# Patient Record
Sex: Male | Born: 1960 | Race: Black or African American | Hispanic: No | Marital: Single | Smoking: Former smoker
Health system: Southern US, Community
[De-identification: ages and names within clinical notes are randomized; demographics above are authoritative.]

## PROBLEM LIST (undated history)

## (undated) DIAGNOSIS — F79 Unspecified intellectual disabilities: Secondary | ICD-10-CM

## (undated) DIAGNOSIS — I10 Essential (primary) hypertension: Secondary | ICD-10-CM

## (undated) DIAGNOSIS — F250 Schizoaffective disorder, bipolar type: Secondary | ICD-10-CM

## (undated) DIAGNOSIS — F259 Schizoaffective disorder, unspecified: Secondary | ICD-10-CM

## (undated) DIAGNOSIS — F429 Obsessive-compulsive disorder, unspecified: Secondary | ICD-10-CM

## (undated) HISTORY — PX: KNEE ARTHROSCOPY: SUR90

## (undated) HISTORY — DX: Schizoaffective disorder, bipolar type: F25.0

## (undated) HISTORY — DX: Schizoaffective disorder, unspecified: F25.9

---

## 2016-08-20 DIAGNOSIS — R440 Auditory hallucinations: Secondary | ICD-10-CM | POA: Diagnosis not present

## 2016-08-20 DIAGNOSIS — R4182 Altered mental status, unspecified: Secondary | ICD-10-CM | POA: Insufficient documentation

## 2016-08-20 DIAGNOSIS — F918 Other conduct disorders: Secondary | ICD-10-CM | POA: Diagnosis present

## 2016-08-20 NOTE — ED Triage Notes (Signed)
Pt brought over voluntarily from [facility] for aggressive behavior; caregiver says pt came out of his room acting like he was going to hurt himself or hurt someone else; pt calm and cooperative in triage;

## 2016-08-20 NOTE — ED Notes (Addendum)
Pt changing out of personal clothing and into behavorial clothing by this tech and RN matt. Pt belongings were received were as follows..........................Marland Kitchen White short sleeved shirt, army green fleece pajama bottoms, black socks (2) and tan fleece bedroom shoes......................Marland Kitchen All belongings placed in belonging bag with lime green sticker on 1 bag and pt name and placed at [employer] for lock up

## 2016-08-21 ENCOUNTER — Emergency Department
Admission: EM | Admit: 2016-08-21 | Discharge: 2016-08-21 | Disposition: A | Discharge status: Home / Self Care | Payer: Medicaid Other | Attending: Emergency Medicine | Admitting: Emergency Medicine

## 2016-08-21 DIAGNOSIS — F22 Delusional disorders: Secondary | ICD-10-CM

## 2016-08-21 DIAGNOSIS — R443 Hallucinations, unspecified: Secondary | ICD-10-CM

## 2016-08-21 DIAGNOSIS — F4320 Adjustment disorder, unspecified: Secondary | ICD-10-CM

## 2016-08-21 LAB — COMPREHENSIVE METABOLIC PANEL
ALT: 24 U/L (ref 17–63)
AST: 26 U/L (ref 15–41)
Albumin: 4.5 g/dL (ref 3.5–5.0)
Alkaline Phosphatase: 74 U/L (ref 38–126)
Anion gap: 4 — ABNORMAL LOW (ref 5–15)
BUN: 22 mg/dL — AB (ref 6–20)
CHLORIDE: 106 mmol/L (ref 101–111)
CO2: 29 mmol/L (ref 22–32)
CREATININE: 1.47 mg/dL — AB (ref 0.61–1.24)
Calcium: 9.8 mg/dL (ref 8.9–10.3)
GFR calc Af Amer: >60 mL/min (ref >60)
GFR calc non Af Amer: 52 mL/min — ABNORMAL LOW (ref >60)
GLUCOSE: 75 mg/dL (ref 65–99)
Potassium: 4.3 mmol/L (ref 3.5–5.1)
SODIUM: 139 mmol/L (ref 135–145)
Total Bilirubin: 0.7 mg/dL (ref 0.3–1.2)
Total Protein: 7.7 g/dL (ref 6.5–8.1)

## 2016-08-21 LAB — CBC WITH DIFFERENTIAL/PLATELET
BASOS ABS: 0 10*3/uL (ref 0–0.1)
Basophils Relative: 1 %
EOS ABS: 0.1 10*3/uL (ref 0–0.7)
EOS PCT: 2 %
HCT: 41.1 % (ref 40.0–52.0)
Hemoglobin: 13.8 g/dL (ref 13.0–18.0)
LYMPHS PCT: 20 %
Lymphs Abs: 1.2 10*3/uL (ref 1.0–3.6)
MCH: 29.9 pg (ref 26.0–34.0)
MCHC: 33.6 g/dL (ref 32.0–36.0)
MCV: 88.9 fL (ref 80.0–100.0)
Monocytes Absolute: 0.4 10*3/uL (ref 0.2–1.0)
Monocytes Relative: 6 %
NEUTROS PCT: 71 %
Neutro Abs: 4.4 10*3/uL (ref 1.4–6.5)
PLATELETS: 178 10*3/uL (ref 150–440)
RBC: 4.62 MIL/uL (ref 4.40–5.90)
RDW: 13.9 % (ref 11.5–14.5)
WBC: 6.2 10*3/uL (ref 3.8–10.6)

## 2016-08-21 LAB — URINE DRUG SCREEN, QUALITATIVE (ARMC ONLY)
Amphetamines, Ur Screen: NOT DETECTED
BARBITURATES, UR SCREEN: NOT DETECTED
Benzodiazepine, Ur Scrn: NOT DETECTED
COCAINE METABOLITE, UR ~~LOC~~: NOT DETECTED
Cannabinoid 50 Ng, Ur ~~LOC~~: NOT DETECTED
MDMA (Ecstasy)Ur Screen: NOT DETECTED
Methadone Scn, Ur: NOT DETECTED
Opiate, Ur Screen: NOT DETECTED
PHENCYCLIDINE (PCP) UR S: NOT DETECTED
Tricyclic, Ur Screen: POSITIVE — AB

## 2016-08-21 LAB — URINALYSIS, ROUTINE W REFLEX MICROSCOPIC
BACTERIA UA: NONE SEEN
Bilirubin Urine: NEGATIVE
GLUCOSE, UA: NEGATIVE mg/dL
Hgb urine dipstick: NEGATIVE
KETONES UR: 5 mg/dL — AB
LEUKOCYTES UA: NEGATIVE
Nitrite: NEGATIVE
PROTEIN: 30 mg/dL — AB
SQUAMOUS EPITHELIAL / LPF: NONE SEEN
Specific Gravity, Urine: 1.026 (ref 1.005–1.030)
pH: 5 (ref 5.0–8.0)

## 2016-08-21 LAB — SALICYLATE LEVEL

## 2016-08-21 LAB — ACETAMINOPHEN LEVEL: Acetaminophen (Tylenol), Serum: <10 ug/mL — ABNORMAL LOW (ref 10–30)

## 2016-08-21 LAB — ETHANOL: Alcohol, Ethyl (B): <5 mg/dL (ref <5)

## 2016-08-21 MED ORDER — LORAZEPAM 2 MG PO TABS
2.0000 mg | ORAL_TABLET | Freq: Once | ORAL | Status: AC
  Administered 2016-08-21: 2 mg via ORAL
  Filled 2016-08-21: qty 1

## 2016-08-21 MED ORDER — NICOTINE 14 MG/24HR TD PT24
14.0000 mg | MEDICATED_PATCH | Freq: Once | TRANSDERMAL | Status: DC
  Administered 2016-08-21: 14 mg via TRANSDERMAL
  Filled 2016-08-21: qty 1

## 2016-08-21 MED ORDER — LORAZEPAM 0.5 MG PO TABS: 0.5000 mg | ORAL_TABLET | Freq: Two times a day (BID) | ORAL | 0 refills | Status: DC | PRN

## 2016-08-21 NOTE — BH Assessment (Signed)
Attempted to contact Director of patients living facility Shirlean Mylar Blackwell-[phone]) and the QP listed as an emergency contact Baldo Daub) to get collateral information.  A voice mail was left for both individuals.

## 2016-08-21 NOTE — BH Assessment (Signed)
Assessment Note  Austin Gates is an 56 y.o. male. The patient came in after displaying aggressive behavior at his group home.  The patient reported he wanted to get a cigarette or get a nicotine patch and a group home worker said something to the patient that he did not like.  The patient said he started yelling at the group home worker, but had no plans of hitting the other group home worker.  The patient denies wanting to hurt anyone.  He reported he has become angry in the past and punched a wall but denies hitting other people.  The patient stated he has a good appetite and sleeps well.  However, since being in the emergency room the patient has had to be redirected to stay in his room.  The patient reported he hears voices, but could not state what the voices were saying.  He denied having visual hallucinations.  He denied SI, HI and SA.  Diagnosis: psychotic disorder  Past Medical History: No past medical history on file.  No past surgical history on file.  Family History: No family history on file.  Social History:  has no tobacco, alcohol, and drug history on file.  Additional Social History:  Alcohol / Drug Use Pain Medications: See PTA Prescriptions: See PTA Over the Counter: See PTA History of alcohol / drug use?: No history of alcohol / drug abuse Longest period of sobriety (when/how long): NA  CIWA: CIWA-Ar BP: 100/71 Pulse Rate: (!) 106 COWS:    Allergies: No Known Allergies  Home Medications:  (Not in a hospital admission)  OB/GYN Status:  No LMP for male patient.  General Assessment Data Location of Assessment: [facility] ED TTS Assessment: In system Is this a Tele or Face-to-Face Assessment?: Face-to-Face Is this an Initial Assessment or a Re-assessment for this encounter?: Initial Assessment Marital status: Single Maiden name: NA Living Arrangements: Group Home Can pt return to current living arrangement?: Yes Admission Status: Voluntary Is patient capable  of signing voluntary admission?: No Referral Source: Other (group home) Insurance type: Medicaid     Crisis Care Plan Living Arrangements: Group Home Legal Guardian: Other relative Name of Psychiatrist: unknown Name of Therapist: unknown  Education Status Is patient currently in school?: No Current Grade: NA Highest grade of school patient has completed: 12 Name of school: NA Contact person: NA  Risk to self with the past 6 months Suicidal Ideation: No Has patient been a risk to self within the past 6 months prior to admission? : No Suicidal Intent: No Has patient had any suicidal intent within the past 6 months prior to admission? : No Is patient at risk for suicide?: No Suicidal Plan?: No Has patient had any suicidal plan within the past 6 months prior to admission? : No Access to Means: No What has been your use of drugs/alcohol within the last 12 months?: none known Previous Attempts/Gestures: No How many times?: 0 Other Self Harm Risks: none Triggers for Past Attempts: None known Intentional Self Injurious Behavior: None Family Suicide History: Unknown Recent stressful life event(s): Conflict (Comment) (conflict with group home worker) Persecutory voices/beliefs?: Yes Depression: No Depression Symptoms: Insomnia Substance abuse history and/or treatment for substance abuse?: No Suicide prevention information given to non-admitted patients: Not applicable  Risk to Others within the past 6 months Homicidal Ideation: No Does patient have any lifetime risk of violence toward others beyond the six months prior to admission? : No Thoughts of Harm to Others: No Current Homicidal Intent: No  Current Homicidal Plan: No Access to Homicidal Means: No Identified Victim: NA History of harm to others?: No Assessment of Violence: None Noted Violent Behavior Description: yelling Does patient have access to weapons?: No Criminal Charges Pending?: No Does patient have a court  date: No Is patient on probation?: No  Psychosis Hallucinations: Auditory Delusions: None noted  Mental Status Report Appearance/Hygiene: In scrubs Eye Contact: Good Motor Activity: Unremarkable Speech: Tangential, Logical/coherent Level of Consciousness: Alert, Restless Mood: Pleasant, Anxious Affect: Anxious Anxiety Level: Moderate Thought Processes: Tangential Judgement: Impaired Orientation: Appropriate for developmental age Obsessive Compulsive Thoughts/Behaviors: None  Cognitive Functioning Concentration: Decreased Memory: Recent Intact, Remote Intact IQ: Average Insight: Fair Impulse Control: Fair Appetite: Good Weight Loss: 0 Weight Gain: 0 Sleep: Decreased Total Hours of Sleep: 4 Vegetative Symptoms: None     Prior Inpatient Therapy Prior Inpatient Therapy: No Prior Therapy Dates: unknown Prior Therapy Facilty/Provider(s): unknown Reason for Treatment: unknown  Prior Outpatient Therapy Prior Outpatient Therapy: No Prior Therapy Dates: unknown Prior Therapy Facilty/Provider(s): unknown Reason for Treatment: unknown Does patient have an ACCT team?: No Does patient have Intensive In-House Services?  : No Does patient have [facility] services? : No Does patient have P4CC services?: No          Abuse/Neglect Assessment (Assessment to be complete while patient is alone) Physical Abuse: Denies Verbal Abuse: Denies Sexual Abuse: Denies Exploitation of patient/patient's resources: Denies Self-Neglect: Denies Values / Beliefs Cultural Requests During Hospitalization: None Spiritual Requests During Hospitalization: None Consults Spiritual Care Consult Needed: No Social Work Consult Needed: No      Additional Information 1:1 In Past 12 Months?: No CIRT Risk: No Elopement Risk: No Does patient have medical clearance?: Yes     Disposition:  Disposition Initial Assessment Completed for this Encounter: Yes Disposition of Patient: Outpatient  treatment  On Site Evaluation by:   Reviewed with Physician:    Enzo Montgomery 08/21/2016 2:43 AM

## 2016-08-21 NOTE — BH Assessment (Signed)
Spoke with Victoria Blas (Director of patient's living facility).  She stated over the past few weeks the patient's behavior has been deteriorating.  She reported last week he punched a hole in a wall.  She reported he also appears to be more confused than what he has been previously.  The patient has lived in this group home for about a year and she reported this is not his normal behavior.

## 2016-08-21 NOTE — ED Notes (Signed)
No orders placed from triage, lab called for specimens, orders placed

## 2016-08-21 NOTE — ED Notes (Signed)
[name], [phone], [phone], sister and guardian, reports she will arrive here (between 0800-0900) to brief staff on his condition

## 2016-08-21 NOTE — ED Notes (Signed)
Pt unable to relax to sleep, Dr Karma Greaser notified, orders received

## 2016-08-21 NOTE — ED Notes (Signed)
Pt given breakfast tray and sprite. Pt requested a cigarette and was informed we cant smoke so pt ask for a patch. RN notified. Pt is requesting watermelon. Pt is pleasant and cooperative.

## 2016-08-21 NOTE — ED Provider Notes (Signed)
Spoke with specialist on-call psychiatry, they feel the patient is appropriate for discharge, recommends 0.5 mg twice a day when necessary for 10 days. Group home has been contacted and will pick up the patient   Lavonia Drafts, MD 08/21/16 1049

## 2016-08-21 NOTE — ED Notes (Signed)
Pt given belongings labeled bag 1 of 1 and is getting dressed at this time for discharge.

## 2016-08-21 NOTE — ED Provider Notes (Signed)
[facility] Emergency Department Provider Note  ____________________________________________   First MD Initiated Contact with Patient 08/21/16 0106     (approximate)  I have reviewed the triage vital signs and the nursing notes.   HISTORY  Chief Complaint Medical Clearance  Level 5 caveat:  history/ROS limited by active psychosis / mental illness / altered mental status   HPI Austin Gates is a 56 y.o. male with unspecified psychiatriy who presen voluntarily from the [facility] living center after reportedly demonstrating some aggressive behavior.  No other information is available at this time.  The patient has a legal guardian and apparently has some degree of chronic cognitive deficit.  He keeps telling me he does not want to be locked up and that he did not do anything wrong.  He thinks that "they" thought he was trying to hurt himself, but he insists he has no intention of hurting himself or anyone else.  He is calm and cooperative at this time.  He denies any acute medical problems, specifically denying fever/chills, chest pain, shortness of breath, nausea, vomiting, abdominal pain.  No other information is available at this time.   No past medical history on file.  There are no active problems to display for this patient.   No past surgical history on file.  Prior to Admission medications   Not on File    Allergies Patient has no known allergies.  No family history on file.  Social History Social History  Substance Use Topics  . Smoking status: Not on file  . Smokeless tobacco: Not on file  . Alcohol use Not on file    Review of Systems Level 5 caveat:  history/ROS limited by chronic cognitive deficits/mental illness ____________________________________________   PHYSICAL EXAM:  VITAL SIGNS: ED Triage Vitals [08/20/16 2327]  Enc Vitals Group     BP 100/71     Pulse Rate (!) 106     Resp 18     Temp 98.7 F (37.1  C)     Temp Source Oral     SpO2 99 %     Weight 68 kg (150 lb)     Height 1.778 m (5\' 10" )     Head Circumference      Peak Flow      Pain Score      Pain Loc      Pain Edu?      Excl. in [place]?     Constitutional: Alert, ambulates without difficulty, answers questions to the best of his ability, calm and cooperative, in no acute distress Eyes: Conjunctivae are normal.  Head: Atraumatic. Nose: No congestion/rhinnorhea. Cardiovascular: Normal rate, regular rhythm. Good peripheral circulation. Grossly normal heart sounds. Respiratory: Normal respiratory effort.  No retractions. Lungs CTAB. Gastrointestinal: Soft and nontender. No distention.  Musculoskeletal: No lower extremity tenderness nor edema. No gross deformities of extremities. Neurologic:  Normal speech and language. No gross focal neurologic deficits are appreciated.  Skin:  Skin is warm, dry and intact. No rash noted. Psychiatric: patient has somewhat of a blunted affect but is calm and cooperative, in no acute distress, denies SI/HI and states g himself or anyon  ____________________________________________   LABS (all labs ordered are listed, but only abnormal results are displayed)  Labs Reviewed  ACETAMINOPHEN LEVEL - Abnormal; Notable for the following:       Result Value   Acetaminophen (Tylenol), Serum <10 (*)    All other components within normal limits  COMPREHENSIVE METABOLIC PANEL -  Abnormal; Notable for the following:    BUN 22 (*)    Creatinine, Ser 1.47 (*)    GFR calc non Af Amer 52 (*)    Anion gap 4 (*)    All other components within normal limits  URINE DRUG SCREEN, QUALITATIVE ([facility] ONLY) - Abnormal; Notable for the following:    Tricyclic, Ur Screen POSITIVE (*)    All other components within normal limits  URINALYSIS, ROUTINE W REFLEX MICROSCOPIC - Abnormal; Notable for the following:    Color, Urine AMBER (*)    APPearance HAZY (*)    Ketones, ur 5 (*)    Protein, ur 30 (*)    All other  components within normal limits  URINE CULTURE  ETHANOL  SALICYLATE LEVEL  CBC WITH DIFFERENTIAL/PLATELET   ____________________________________________  EKG  None - EKG not ordered by ED physician ____________________________________________  RADIOLOGY   No results found.  ____________________________________________   PROCEDURES  Critical Care performed: No   Procedure(s) performed:   Procedures   ____________________________________________   INITIAL IMPRESSION / ASSESSMENT AND PLAN / ED COURSE  Pertinent labs & imaging results that were available during my care of the patient were reviewed by me and considered in my medical decision making (see chart for details).  reportedly the patient "came out of his room" and acted like he was going tohurt himself or someone else but he is very calm and cooperative now and insists that is not the case.  I have consulted TTS and spoke phone with Delilah Shan who is going to talk to the patient and call the living facility for more information.  I do not feel an urgent psychiatric consultation nor IVC is appropriate or warranted at this time.   Clinical Course as of Aug 21 645  Sun Aug 21, 2016  0356 Lab work is generally reassuring.  He has some abnormalities on his urinalysis with 6-30 red blood cells and 6-30 white blood cells with some calcium oxalate crystals seen.  He does not report any dysuria and I will send the urine for culture but there is no indication for empiric antibiotics at this time.  Labs otherwise reassuring.  He has a creatinine of 1.47 and I do not have any labs against which to compare to determine his baseline but he is eating and drinking and I am encouraging by mouth fluids.  Again, he complains of no physical complaints at this time.  TTS is still attempting to reach the group home for collateral information  [CF]  0642 Kendall with TTS was able to speak with the group home.  Their report that over the last  2-3 weeks, the patient has been increasingly paranoid and seems to be having visual or possibly auditory hallucinations, seeing people that are not there abdomen there in an extended period time, etc.  They are worried about his behavior and these new symptoms.  He has been at the group home for more than a year but his behavior is change of the last several weeks.  I have ordered a telepsych consults.  [CF]    Clinical Course User Index [CF] Hinda Kehr, MD    ____________________________________________  FINAL CLINICAL IMPRESSION(S) / ED DIAGNOSES  Final diagnoses:  Hallucinations  Paranoia ([place])     MEDICATIONS GIVEN DURING THIS VISIT:  Medications  LORazepam (ATIVAN) tablet 2 mg (2 mg Oral Given 08/21/16 0226)     NEW OUTPATIENT MEDICATIONS STARTED DURING THIS VISIT:  New Prescriptions   No medications  on file    Modified Medications   No medications on file    Discontinued Medications   No medications on file     Note:  This document was prepared using Dragon voice recognition software and may include unintentional dictation errors.    Hinda Kehr, MD 08/21/16 [phone]

## 2016-08-21 NOTE — ED Notes (Signed)
Pt unwilling to stay in bed for more than a few moments

## 2016-08-22 LAB — URINE CULTURE
Culture: NO GROWTH
SPECIAL REQUESTS: NORMAL

## 2016-09-17 ENCOUNTER — Emergency Department: Payer: Medicaid Other

## 2016-09-17 ENCOUNTER — Observation Stay
Admission: EM | Admit: 2016-09-17 | Discharge: 2016-09-18 | Disposition: A | Discharge status: Home / Self Care | Payer: Medicaid Other | Attending: Internal Medicine | Admitting: Internal Medicine

## 2016-09-17 ENCOUNTER — Encounter: Payer: Self-pay | Admitting: Internal Medicine

## 2016-09-17 DIAGNOSIS — E860 Dehydration: Secondary | ICD-10-CM | POA: Diagnosis not present

## 2016-09-17 DIAGNOSIS — I10 Essential (primary) hypertension: Secondary | ICD-10-CM | POA: Diagnosis not present

## 2016-09-17 DIAGNOSIS — Z79899 Other long term (current) drug therapy: Secondary | ICD-10-CM | POA: Diagnosis not present

## 2016-09-17 DIAGNOSIS — Z9181 History of falling: Secondary | ICD-10-CM | POA: Diagnosis not present

## 2016-09-17 DIAGNOSIS — I951 Orthostatic hypotension: Secondary | ICD-10-CM | POA: Diagnosis not present

## 2016-09-17 DIAGNOSIS — R55 Syncope and collapse: Secondary | ICD-10-CM

## 2016-09-17 DIAGNOSIS — E559 Vitamin D deficiency, unspecified: Secondary | ICD-10-CM | POA: Diagnosis not present

## 2016-09-17 DIAGNOSIS — E119 Type 2 diabetes mellitus without complications: Secondary | ICD-10-CM | POA: Diagnosis not present

## 2016-09-17 DIAGNOSIS — F17210 Nicotine dependence, cigarettes, uncomplicated: Secondary | ICD-10-CM | POA: Diagnosis not present

## 2016-09-17 DIAGNOSIS — F99 Mental disorder, not otherwise specified: Secondary | ICD-10-CM | POA: Diagnosis not present

## 2016-09-17 DIAGNOSIS — E871 Hypo-osmolality and hyponatremia: Secondary | ICD-10-CM | POA: Diagnosis not present

## 2016-09-17 DIAGNOSIS — F419 Anxiety disorder, unspecified: Secondary | ICD-10-CM | POA: Diagnosis not present

## 2016-09-17 DIAGNOSIS — R42 Dizziness and giddiness: Secondary | ICD-10-CM

## 2016-09-17 HISTORY — DX: Essential (primary) hypertension: I10

## 2016-09-17 LAB — COMPREHENSIVE METABOLIC PANEL
ALBUMIN: 3.5 g/dL (ref 3.5–5.0)
ALK PHOS: 64 U/L (ref 38–126)
ALT: 16 U/L — ABNORMAL LOW (ref 17–63)
AST: 19 U/L (ref 15–41)
Anion gap: 7 (ref 5–15)
BILIRUBIN TOTAL: 0.9 mg/dL (ref 0.3–1.2)
BUN: 24 mg/dL — AB (ref 6–20)
CALCIUM: 9.1 mg/dL (ref 8.9–10.3)
CO2: 26 mmol/L (ref 22–32)
Chloride: 98 mmol/L — ABNORMAL LOW (ref 101–111)
Creatinine, Ser: 1.24 mg/dL (ref 0.61–1.24)
GFR calc Af Amer: >60 mL/min (ref >60)
GFR calc non Af Amer: >60 mL/min (ref >60)
GLUCOSE: 90 mg/dL (ref 65–99)
Potassium: 3.8 mmol/L (ref 3.5–5.1)
Sodium: 131 mmol/L — ABNORMAL LOW (ref 135–145)
TOTAL PROTEIN: 6.3 g/dL — AB (ref 6.5–8.1)

## 2016-09-17 LAB — URINALYSIS, COMPLETE (UACMP) WITH MICROSCOPIC
BACTERIA UA: NONE SEEN
Bilirubin Urine: NEGATIVE
Glucose, UA: NEGATIVE mg/dL
Hgb urine dipstick: NEGATIVE
KETONES UR: NEGATIVE mg/dL
LEUKOCYTES UA: NEGATIVE
Nitrite: NEGATIVE
PH: 7 (ref 5.0–8.0)
Protein, ur: NEGATIVE mg/dL
RBC / HPF: NONE SEEN RBC/hpf (ref 0–5)
SPECIFIC GRAVITY, URINE: 1.002 — AB (ref 1.005–1.030)
SQUAMOUS EPITHELIAL / LPF: NONE SEEN
WBC UA: NONE SEEN WBC/hpf (ref 0–5)

## 2016-09-17 LAB — CBC
HEMATOCRIT: 35.8 % — AB (ref 40.0–52.0)
HEMOGLOBIN: 12.1 g/dL — AB (ref 13.0–18.0)
MCH: 29.8 pg (ref 26.0–34.0)
MCHC: 33.8 g/dL (ref 32.0–36.0)
MCV: 88 fL (ref 80.0–100.0)
Platelets: 202 10*3/uL (ref 150–440)
RBC: 4.06 MIL/uL — ABNORMAL LOW (ref 4.40–5.90)
RDW: 13 % (ref 11.5–14.5)
WBC: 7.1 10*3/uL (ref 3.8–10.6)

## 2016-09-17 LAB — MAGNESIUM: Magnesium: 1.7 mg/dL (ref 1.7–2.4)

## 2016-09-17 LAB — GLUCOSE, CAPILLARY: Glucose-Capillary: 81 mg/dL (ref 65–99)

## 2016-09-17 LAB — TROPONIN I: Troponin I: <0.03 ng/mL (ref <0.03)

## 2016-09-17 MED ORDER — INSULIN ASPART 100 UNIT/ML ~~LOC~~ SOLN: 0.0000 [IU] | Freq: Three times a day (TID) | SUBCUTANEOUS | Status: DC

## 2016-09-17 MED ORDER — ACETAMINOPHEN 650 MG RE SUPP: 650.0000 mg | Freq: Four times a day (QID) | RECTAL | Status: DC | PRN

## 2016-09-17 MED ORDER — ONDANSETRON HCL 4 MG PO TABS: 4.0000 mg | ORAL_TABLET | Freq: Four times a day (QID) | ORAL | Status: DC | PRN

## 2016-09-17 MED ORDER — INSULIN ASPART 100 UNIT/ML ~~LOC~~ SOLN: 0.0000 [IU] | Freq: Every day | SUBCUTANEOUS | Status: DC

## 2016-09-17 MED ORDER — ONDANSETRON HCL 4 MG/2ML IJ SOLN: 4.0000 mg | Freq: Four times a day (QID) | INTRAMUSCULAR | Status: DC | PRN

## 2016-09-17 MED ORDER — ACETAMINOPHEN 325 MG PO TABS: 650.0000 mg | ORAL_TABLET | Freq: Four times a day (QID) | ORAL | Status: DC | PRN

## 2016-09-17 MED ORDER — ENOXAPARIN SODIUM 40 MG/0.4ML ~~LOC~~ SOLN: 40.0000 mg | SUBCUTANEOUS | Status: DC

## 2016-09-17 MED ORDER — SODIUM CHLORIDE 0.9 % IV SOLN
INTRAVENOUS | Status: DC
  Administered 2016-09-17: 20:00:00 via INTRAVENOUS

## 2016-09-17 MED ORDER — SENNOSIDES-DOCUSATE SODIUM 8.6-50 MG PO TABS: 1.0000 | ORAL_TABLET | Freq: Every evening | ORAL | Status: DC | PRN

## 2016-09-17 MED ORDER — SODIUM CHLORIDE 0.9 % IV BOLUS (SEPSIS)
1000.0000 mL | Freq: Once | INTRAVENOUS | Status: AC
  Administered 2016-09-17: 1000 mL via INTRAVENOUS

## 2016-09-17 MED ORDER — HYDROCODONE-ACETAMINOPHEN 5-325 MG PO TABS
1.0000 | ORAL_TABLET | ORAL | Status: DC | PRN
  Administered 2016-09-18: 1 via ORAL
  Filled 2016-09-17: qty 1

## 2016-09-17 MED ORDER — LORAZEPAM 0.5 MG PO TABS
0.5000 mg | ORAL_TABLET | Freq: Two times a day (BID) | ORAL | Status: DC | PRN
  Administered 2016-09-17: 0.5 mg via ORAL
  Filled 2016-09-17: qty 1

## 2016-09-17 MED ORDER — ALBUTEROL SULFATE (2.5 MG/3ML) 0.083% IN NEBU: 2.5000 mg | INHALATION_SOLUTION | RESPIRATORY_TRACT | Status: DC | PRN

## 2016-09-17 NOTE — ED Notes (Signed)
Pt cleaned up of urine again - spilled his urine from urinal into the bed.

## 2016-09-17 NOTE — ED Provider Notes (Signed)
[facility] Emergency Department Provider Note  Time seen: 2:43 PM  I have reviewed the triage vital signs and the nursing notes.   HISTORY  Chief Complaint Near Syncope    HPI Jamil Armwood is a 56 y.o. male with a past medical history an unspecified psychiatric disorder who currently lives at a group facility presents for near syncopal episodes.  According to EMS the patient has been falling recently. Patient is confused and is not clear what his baseline is at this time. He is not able to give a history, does not know the date or his location at this time. Does not know why he is here.  No past medical history on file.  There are no active problems to display for this patient.   No past surgical history on file.  Prior to Admission medications   Medication Sig Start Date End Date Taking? Authorizing Provider  LORazepam (ATIVAN) 0.5 MG tablet Take 1 tablet (0.5 mg total) by mouth 2 (two) times daily as needed for anxiety. 08/21/16 08/21/17  Lavonia Drafts, MD    No Known Allergies  No family history on file.  Social History Social History  Substance Use Topics  . Smoking status: Not on file  . Smokeless tobacco: Not on file  . Alcohol use Not on file    Review of Systems Unable to complete a review of systems as the patient has confusion and possible baseline confusion.  ____________________________________________   PHYSICAL EXAM:  VITAL SIGNS: ED Triage Vitals  Enc Vitals Group     BP --      Pulse Rate 09/17/16 1425 89     Resp 09/17/16 1425 18     Temp 09/17/16 1423 98 F (36.7 C)     Temp src --      SpO2 09/17/16 1425 100 %     Weight 09/17/16 1425 150 lb (68 kg)     Height 09/17/16 1425 5\' 10"  (1.778 m)     Head Circumference --      Peak Flow --      Pain Score 09/17/16 1423 9     Pain Loc --      Pain Edu? --      Excl. in [place]? --     Constitutional: Alert, no distress, disoriented. Unclear baseline. Eyes: Normal  exam ENT   Head: Normocephalic, several old abrasions to the scalp.   Mouth/Throat: Mucous membranes are moist. Cardiovascular: Normal rate, regular rhythm. No murmur Respiratory: Normal respiratory effort without tachypnea nor retractions. Breath sounds are clear  Gastrointestinal: Soft and nontender. No distention.  Musculoskeletal: Nontender with normal range of motion in all extremities. No lower extremity tenderness or edema. Neurologic:  Normal speech. Patient moves all extremities well, ambulates well. No focal deficit identified. Skin:  Skin is warm, dry and intact. Several old appearing abrasions to the scalp and a couple to his extremities. Psychiatric: Patient answers questions follows commands but is disoriented, does not appear to know why he is here. Difficult to assess psychiatrically. Unclear baseline at this time.  ____________________________________________    EKG  EKG reviewed and interpreted by myself shows normal sinus rhythm at 79 bpm, narrow QRS, normal axis, normal intervals, nonspecific ST changes. No significant elevation. Do not agree with computer interpreted MI.  ____________________________________________    RADIOLOGY  CT head  IMPRESSION: Normal study.  ____________________________________________   INITIAL IMPRESSION / ASSESSMENT AND PLAN / ED COURSE  Pertinent labs & imaging results that were  available during my care of the patient were reviewed by me and considered in my medical decision making (see chart for details).  Patient presents to the emergency department for falls/near syncope. In the emergency department the patient had one of his episodes were he was walking and then lowered himself to the ground. Did not hit his head. I assume this is what has been happening at the group home as well. EMS states initial blood pressure of 28M systolic. Currently 98 systolic. Patient denies any pain. No chest pain and abdominal pain, vomiting,  shortness of breath. We will IV hydrate, check labs, CT head and closely monitor.  Patient's blood work has returned largely at his baseline. Urinalysis is negative. Chemistry is normal. Troponin is negative. CT scan of the head is negative. Patient continues to be extremely unsteady and lightheaded especially upon standing. Orthostatics are significantly positive. Upon standing the patient also fell again. We'll admit to the hospital for further workup and treatment.  ____________________________________________   FINAL CLINICAL IMPRESSION(S) / ED DIAGNOSES  Falls Near-syncope    Harvest Dark, MD 09/17/16 1725

## 2016-09-17 NOTE — ED Notes (Signed)
Pt was triage in room 12 by this nurse. When this nurse went to update his nurse the pt got up, pulled out iv, walked out into the hallway. This nurse did not witness any fall but found pt sitting on the floor outside his room. It took 2 people to ambulate him back to the room, stop the bleeding from him pulling out the iv and clean up his room of blood. Pt then moved to room 15 in front of nurses station and sitter placed.

## 2016-09-17 NOTE — Progress Notes (Signed)
Patient was admitted to room 159 from ED. Sister at bedside. Patient is A&O x3, but impulsive. Abrasion to multiple areas including bilat knees, forehead, nose, and buttocks. IV fluids started. IV site wrapped in kerlix. Bed alarm on for safety. Sitter at bedside.

## 2016-09-17 NOTE — ED Triage Notes (Signed)
Per ems pt had near syncope and fell - witnessed. Multiple falls lately. bp low for ems but 144/114 with bolus of 300cc. Pt states he remembers falling. Abrasion to kneef

## 2016-09-17 NOTE — H&P (Signed)
[place] at [place] NAME: Austin Gates    MR#:  [id number]  DATE OF BIRTH:  1960/09/04  DATE OF ADMISSION:  09/17/2016  PRIMARY CARE PHYSICIAN: Raelyn Number, MD   REQUESTING/REFERRING PHYSICIAN: Harvest Dark, MD  CHIEF COMPLAINT:   Chief Complaint  Patient presents with  . Near Syncope   Near syncope and frequent fall. HISTORY OF PRESENT ILLNESS:  Austin Gates  is a 56 y.o. male with a known history of Hypertension diabetes. The patient was sent from group home to the ED due to above chief complaints. The patient is alert, awake and oriented 3 but he is not good historian. He denies any symptoms. He has been falling frequently recently and near syncope episode. He was found orthostatic hypotension in the ED. Per RN, the patient urinated a lot in the ED.  PAST MEDICAL HISTORY:   Past Medical History:  Diagnosis Date  . Diabetes mellitus without complication ([place])   . Hypertension     PAST SURGICAL HISTORY:   Past Surgical History:  Procedure Laterality Date  . KNEE ARTHROSCOPY      SOCIAL HISTORY:   Social History  Substance Use Topics  . Smoking status: Not on file  . Smokeless tobacco: Not on file  . Alcohol use Not on file    FAMILY HISTORY:   Family History  Problem Relation Age of Onset  . Diabetes Mother     DRUG ALLERGIES:  No Known Allergies  REVIEW OF SYSTEMS:   Review of Systems  Constitutional: Negative for chills, fever and malaise/fatigue.  HENT: Negative for sore throat.   Eyes: Negative for blurred vision and double vision.  Respiratory: Negative for cough, hemoptysis, shortness of breath, wheezing and stridor.   Cardiovascular: Negative for chest pain, palpitations, orthopnea and leg swelling.  Gastrointestinal: Negative for abdominal pain, blood in stool, diarrhea, melena, nausea and vomiting.  Genitourinary: Negative for dysuria, flank pain and hematuria.  Musculoskeletal:  Negative for back pain and joint pain.  Neurological: Negative for dizziness, sensory change, focal weakness, seizures, loss of consciousness, weakness and headaches.  Endo/Heme/Allergies: Negative for polydipsia.  Psychiatric/Behavioral: Negative for depression. The patient is not nervous/anxious.     MEDICATIONS AT HOME:   Prior to Admission medications   Medication Sig Start Date End Date Taking? Authorizing Provider  LORazepam (ATIVAN) 0.5 MG tablet Take 1 tablet (0.5 mg total) by mouth 2 (two) times daily as needed for anxiety. 08/21/16 08/21/17  Lavonia Drafts, MD      VITAL SIGNS:  Blood pressure 124/88, pulse 83, temperature 98 F (36.7 C), resp. rate 18, height 5\' 10"  (1.778 m), weight 150 lb (68 kg), SpO2 100 %.  PHYSICAL EXAMINATION:  Physical Exam  GENERAL:  56 y.o.-year-old patient lying in the bed with no acute distress.  EYES: Pupils equal, round, reactive to light and accommodation. No scleral icterus. Extraocular muscles intact.  HEENT: Head atraumatic, normocephalic. Oropharynx and nasopharynx clear.  NECK:  Supple, no jugular venous distention. No thyroid enlargement, no tenderness.  LUNGS: Normal breath sounds bilaterally, no wheezing, rales,rhonchi or crepitation. No use of accessory muscles of respiration.  CARDIOVASCULAR: S1, S2 normal. No murmurs, rubs, or gallops.  ABDOMEN: Soft, nontender, nondistended. Bowel sounds present. No organomegaly or mass.  EXTREMITIES: No pedal edema, cyanosis, or clubbing.  NEUROLOGIC: Cranial nerves II through XII are intact. Muscle strength 5/5 in all extremities. Sensation intact. Gait not checked.  PSYCHIATRIC: The patient is alert and oriented  x 3.  SKIN: No obvious rash, lesion, or ulcer.   LABORATORY PANEL:   CBC  Recent Labs Lab 09/17/16 1512  WBC 7.1  HGB 12.1*  HCT 35.8*  PLT 202   ------------------------------------------------------------------------------------------------------------------  Chemistries    Recent Labs Lab 09/17/16 1512  NA 131*  K 3.8  CL 98*  CO2 26  GLUCOSE 90  BUN 24*  CREATININE 1.24  CALCIUM 9.1  AST 19  ALT 16*  ALKPHOS 64  BILITOT 0.9   ------------------------------------------------------------------------------------------------------------------  Cardiac Enzymes  Recent Labs Lab 09/17/16 1512  TROPONINI <0.03   ------------------------------------------------------------------------------------------------------------------  RADIOLOGY:  Ct Head Wo Contrast  Result Date: 09/17/2016 CLINICAL DATA:  Near syncope, fall. EXAM: CT HEAD WITHOUT CONTRAST TECHNIQUE: Contiguous axial images were obtained from the base of the skull through the vertex without intravenous contrast. COMPARISON:  None. FINDINGS: Brain: No acute intracranial abnormality. Specifically, no hemorrhage, hydrocephalus, mass lesion, acute infarction, or significant intracranial injury. Vascular: No hyperdense vessel or unexpected calcification. Skull: No acute calvarial abnormality. Sinuses/Orbits: Visualized paranasal sinuses and mastoids clear. Orbital soft tissues unremarkable. Other: None IMPRESSION: Normal study. Electronically Signed   By: Rolm Baptise M.D.   On: 09/17/2016 15:32      IMPRESSION AND PLAN:    Orthostatic hypotension due to dehydration and hyponatremia. The patient will be placed for observation. Start normal saline IV, follow-up vital sign and BMP. Frequent fall. Fall precaution and physical therapy evaluation. Hypertension. Blood pressure is low side without hypertension medication. Diabetes. Start sliding scale. Tobacco abuse. Smoking cessation was counseled for 3-4 minutes. Nicotine patch.  All the records are reviewed and case discussed with ED provider. Management plans discussed with the patient, family and they are in agreement.  CODE STATUS:   TOTAL TIME TAKING CARE OF THIS PATIENT: 52 minutes.    Demetrios Loll M.D on 09/17/2016 at 6:11  PM  Between 7am to 6pm - Pager - [phone]  After 6pm go to [email or web address] - Proofreader  [facility]  Office  [phone]  CC: Primary care physician; Raelyn Number, MD   Note: This dictation was prepared with Dragon dictation along with smaller phrase technology. Any transcriptional errors that result from this process are unintentional.

## 2016-09-18 LAB — BASIC METABOLIC PANEL
ANION GAP: 6 (ref 5–15)
BUN: 17 mg/dL (ref 6–20)
CHLORIDE: 109 mmol/L (ref 101–111)
CO2: 29 mmol/L (ref 22–32)
Calcium: 9.8 mg/dL (ref 8.9–10.3)
Creatinine, Ser: 1.01 mg/dL (ref 0.61–1.24)
GFR calc Af Amer: >60 mL/min (ref >60)
GLUCOSE: 91 mg/dL (ref 65–99)
POTASSIUM: 4 mmol/L (ref 3.5–5.1)
SODIUM: 144 mmol/L (ref 135–145)

## 2016-09-18 LAB — GLUCOSE, CAPILLARY
GLUCOSE-CAPILLARY: 44 mg/dL — AB (ref 65–99)
Glucose-Capillary: 105 mg/dL — ABNORMAL HIGH (ref 65–99)
Glucose-Capillary: 76 mg/dL (ref 65–99)

## 2016-09-18 MED ORDER — LORAZEPAM 0.5 MG PO TABS: 0.5000 mg | ORAL_TABLET | Freq: Two times a day (BID) | ORAL | 0 refills | Status: DC | PRN

## 2016-09-18 MED ORDER — OLANZAPINE 5 MG PO TABS: 10.0000 mg | ORAL_TABLET | Freq: Every day | ORAL | Status: DC

## 2016-09-18 NOTE — Care Management Obs Status (Signed)
[facility] NOTIFICATION   Patient Details  Name: Bergen Magner MRN: [id number] Date of Birth: 1960/07/29   Medicare Observation Status Notification Given:  No  Discharged within 24 hours    Dejanae Helser A, RN 09/18/2016, 10:48 AM

## 2016-09-18 NOTE — Discharge Summary (Signed)
[facility] at [place] NAME: Austin Gates    MR#:  [id number]  DATE OF BIRTH:  01/04/1961  DATE OF ADMISSION:  09/17/2016   ADMITTING PHYSICIAN: Demetrios Loll, MD  DATE OF DISCHARGE:  09/18/2016  PRIMARY CARE PHYSICIAN: Raelyn Number, MD   ADMISSION DIAGNOSIS:   Dehydration [E86.0] Near syncope [R55] Orthostatic dizziness [R42]  DISCHARGE DIAGNOSIS:   Active Problems:   Orthostatic hypotension   SECONDARY DIAGNOSIS:   Past Medical History:  Diagnosis Date  . Diabetes mellitus without complication ([place])   . Hypertension     HOSPITAL COURSE:   56 y/o M with PMH of psychiatric disorder, who was coming from his group home was brought in secondary to falls.  #1 multiple falls-likely related to hypotension. -CT of the head is normal, urine study negative for any infection. -His atenolol is being discontinued at discharge. Hasn't had any issues with hypotension or falls in the hospital -His falls are also likely related to impulsiveness, so physical therapy is going to evaluate him and see if he can use a cane or walker. But he needs to be redirected constantly be able to use that.  #2 psychiatry disorder-likely schizoaffective -Continue follow-up with psychiatrist as outpatient. Sister at bedside confirmed that patient does have an outpatient psych doctor -He is on clozapine, Cogentin, Lexapro, Zyprexa and Ativan  #3 vitamin D deficiency-continue supplements  #4 not sure why, but patient is on desmopressin. Watch for any hyponatremia. Sodium levels have been within normal levels.  Physical therapy consult is pending. Potential discharge to group home today Sister at bedside confirms patient's baseline mental status   DISCHARGE CONDITIONS:   Guarded CONSULTS OBTAINED:    none  DRUG ALLERGIES:   No Known Allergies DISCHARGE MEDICATIONS:   Allergies as of 09/18/2016   No Known Allergies     Medication List      STOP taking these medications   atenolol 25 MG tablet Commonly known as:  TENORMIN     TAKE these medications   benztropine 2 MG tablet Commonly known as:  COGENTIN Take 2 mg by mouth 2 (two) times daily.   bismuth subsalicylate 539 JQ/73AL suspension Commonly known as:  PEPTO BISMOL Take 30 mLs by mouth every 6 (six) hours as needed.   cloZAPine 100 MG tablet Commonly known as:  CLOZARIL Take 400-450 mg by mouth See admin instructions. tk 450mg  in the morning and 400 mg at bedtime   desmopressin 0.2 MG tablet Commonly known as:  DDAVP Take 0.2 mg by mouth at bedtime.   escitalopram 10 MG tablet Commonly known as:  LEXAPRO Take 10 mg by mouth daily.   hydrOXYzine 25 MG tablet Commonly known as:  ATARAX/VISTARIL Take 25 mg by mouth at bedtime.   LORazepam 0.5 MG tablet Commonly known as:  ATIVAN Take 1 tablet (0.5 mg total) by mouth 2 (two) times daily as needed for anxiety. What changed:  when to take this   Melatonin 5 MG Tabs Take 5 mg by mouth at bedtime as needed.   OLANZapine 10 MG tablet Commonly known as:  ZYPREXA Take 10 mg by mouth daily as needed.   Vitamin D-3 5000 units Tabs Take 1 tablet by mouth daily.        DISCHARGE INSTRUCTIONS:   1. Psychiatry follow-up in 1-2 weeks 2. BMP checked in 1 week and PCP follow-up  DIET:   Regular diet  ACTIVITY:   Activity as tolerated  OXYGEN:  Home Oxygen: No.  Oxygen Delivery: room air  DISCHARGE LOCATION:   group home   If you experience worsening of your admission symptoms, develop shortness of breath, life threatening emergency, suicidal or homicidal thoughts you must seek medical attention immediately by calling 911 or calling your MD immediately  if symptoms less severe.  You Must read complete instructions/literature along with all the possible adverse reactions/side effects for all the Medicines you take and that have been prescribed to you. Take any new Medicines after you have  completely understood and accpet all the possible adverse reactions/side effects.   Please note  You were cared for by a hospitalist during your hospital stay. If you have any questions about your discharge medications or the care you received while you were in the hospital after you are discharged, you can call the unit and asked to speak with the hospitalist on call if the hospitalist that took care of you is not available. Once you are discharged, your primary care physician will handle any further medical issues. Please note that NO REFILLS for any discharge medications will be authorized once you are discharged, as it is imperative that you return to your primary care physician (or establish a relationship with a primary care physician if you do not have one) for your aftercare needs so that they can reassess your need for medications and monitor your lab values.    On the day of Discharge:  VITAL SIGNS:   Blood pressure 122/89, pulse 80, temperature (!) 97.5 F (36.4 C), temperature source Oral, resp. rate 20, height 5\' 10"  (1.778 m), weight 68 kg (150 lb), SpO2 100 %.  PHYSICAL EXAMINATION:    GENERAL:  56 y.o.-year-old patient lying in the bed with no acute distress. Restless and impulsive EYES: Pupils equal, round, reactive to light and accommodation. No scleral icterus. Extraocular muscles intact.  HEENT: Head atraumatic, normocephalic. Oropharynx and nasopharynx clear.  NECK:  Supple, no jugular venous distention. No thyroid enlargement, no tenderness.  LUNGS: Normal breath sounds bilaterally, no wheezing, rales,rhonchi or crepitation. No use of accessory muscles of respiration.  CARDIOVASCULAR: S1, S2 normal. No murmurs, rubs, or gallops.  ABDOMEN: Soft, non-tender, non-distended. Bowel sounds present. No organomegaly or mass.  EXTREMITIES: No pedal edema, cyanosis, or clubbing.  NEUROLOGIC: Cranial nerves II through XII are intact. Muscle strength 5/5 in all extremities.  Sensation intact. Gait not checked.  PSYCHIATRIC: The patient is alert and oriented to self, very restless and impulsive.  SKIN: No obvious rash, lesion, or ulcer.   DATA REVIEW:   CBC  Recent Labs Lab 09/17/16 1512  WBC 7.1  HGB 12.1*  HCT 35.8*  PLT 202    Chemistries   Recent Labs Lab 09/17/16 1500  09/17/16 1512 09/18/16 0407  NA  --   < > 131* 144  K  --   < > 3.8 4.0  CL  --   < > 98* 109  CO2  --   < > 26 29  GLUCOSE  --   < > 90 91  BUN  --   < > 24* 17  CREATININE  --   < > 1.24 1.01  CALCIUM  --   < > 9.1 9.8  MG 1.7  --   --   --   AST  --   --  19  --   ALT  --   --  16*  --   ALKPHOS  --   --  64  --  BILITOT  --   --  0.9  --   < > = values in this interval not displayed.   Microbiology Results  Results for orders placed or performed during the hospital encounter of 08/21/16  Urine Culture     Status: None   Collection Time: 08/20/16 11:30 PM  Result Value Ref Range Status   Specimen Description URINE, CLEAN CATCH  Final   Special Requests Normal  Final   Culture   Final    NO GROWTH Performed at [facility], [place] [address], [place], [place] [address]    Report Status 08/22/2016 FINAL  Final    RADIOLOGY:  Ct Head Wo Contrast  Result Date: 09/17/2016 CLINICAL DATA:  Near syncope, fall. EXAM: CT HEAD WITHOUT CONTRAST TECHNIQUE: Contiguous axial images were obtained from the base of the skull through the vertex without intravenous contrast. COMPARISON:  None. FINDINGS: Brain: No acute intracranial abnormality. Specifically, no hemorrhage, hydrocephalus, mass lesion, acute infarction, or significant intracranial injury. Vascular: No hyperdense vessel or unexpected calcification. Skull: No acute calvarial abnormality. Sinuses/Orbits: Visualized paranasal sinuses and mastoids clear. Orbital soft tissues unremarkable. Other: None IMPRESSION: Normal study. Electronically Signed   By: Rolm Baptise M.D.   On: 09/17/2016 15:32     Management  plans discussed with the patient, family and they are in agreement.  CODE STATUS:     Code Status Orders        Start     Ordered   09/17/16 1901  Full code  Continuous     09/17/16 1901    Code Status History    Date Active Date Inactive Code Status Order ID Comments User Context   This patient has a current code status but no historical code status.      TOTAL TIME TAKING CARE OF THIS PATIENT: 37 minutes.    Elvi Leventhal M.D on 09/18/2016 at 11:11 AM  Between 7am to 6pm - Pager - [phone]  After 6pm go to [email or web address] - Proofreader  [facility]  Office  [phone]  CC: Primary care physician; Raelyn Number, MD   Note: This dictation was prepared with Dragon dictation along with smaller phrase technology. Any transcriptional errors that result from this process are unintentional.

## 2016-09-18 NOTE — NC FL2 (Signed)
[place] LEVEL OF CARE SCREENING TOOL     IDENTIFICATION  Patient Name: Austin Gates Birthdate: [birth year] Sex: male Admission Date (Current Location): 09/17/2016  [place] and [place] Number:  Engineering geologist and Address:  [facility], [address] [place], [place] [address]      Provider Number: [id number]  Attending Physician Name and Address:  Gladstone Lighter, MD  Relative Name and Phone Number:       Current Level of Care: Hospital Recommended Level of Care: [facility] Prior Approval Number:    Date Approved/Denied:   PASRR Number:    Discharge Plan: Domiciliary (Rest home)    Current Diagnoses: Patient Active Problem List   Diagnosis Date Noted  . Orthostatic hypotension 09/17/2016    Orientation RESPIRATION BLADDER Height & Weight     Self, Situation, Place  Normal Continent Weight: 150 lb (68 kg) Height:  5\' 10"  (177.8 cm)  BEHAVIORAL SYMPTOMS/MOOD NEUROLOGICAL BOWEL NUTRITION STATUS      Continent    AMBULATORY STATUS COMMUNICATION OF NEEDS Skin   Independent Verbally Normal                       Personal Care Assistance Level of Assistance  Bathing, Feeding, Dressing Bathing Assistance: Independent Feeding assistance: Independent Dressing Assistance: Independent     Functional Limitations Info             SPECIAL CARE FACTORS FREQUENCY                       Contractures Contractures Info: Present    Additional Factors Info  Code Status, Allergies Code Status Info: Full Allergies Info: NKA           Current Medications (09/18/2016):  This is the current hospital active medication list Current Facility-Administered Medications  Medication Dose Route Frequency Provider Last Rate Last Dose  . acetaminophen (TYLENOL) tablet 650 mg  650 mg Oral Q6H PRN Demetrios Loll, MD       Or  . acetaminophen (TYLENOL) suppository 650 mg  650 mg Rectal Q6H PRN Demetrios Loll, MD      .  albuterol (PROVENTIL) (2.5 MG/3ML) 0.083% nebulizer solution 2.5 mg  2.5 mg Nebulization Q2H PRN Demetrios Loll, MD      . enoxaparin (LOVENOX) injection 40 mg  40 mg Subcutaneous Q24H Demetrios Loll, MD      . HYDROcodone-acetaminophen (NORCO/VICODIN) 5-325 MG per tablet 1-2 tablet  1-2 tablet Oral Q4H PRN Demetrios Loll, MD   1 tablet at 09/18/16 0151  . insulin aspart (novoLOG) injection 0-5 Units  0-5 Units Subcutaneous QHS Demetrios Loll, MD      . insulin aspart (novoLOG) injection 0-9 Units  0-9 Units Subcutaneous TID WC Demetrios Loll, MD      . LORazepam (ATIVAN) tablet 0.5 mg  0.5 mg Oral BID PRN Demetrios Loll, MD   0.5 mg at 09/17/16 2252  . OLANZapine (ZYPREXA) tablet 10 mg  10 mg Oral Daily Gladstone Lighter, MD      . ondansetron [facility]) tablet 4 mg  4 mg Oral Q6H PRN Demetrios Loll, MD       Or  . ondansetron [facility]) injection 4 mg  4 mg Intravenous Q6H PRN Demetrios Loll, MD      . senna-docusate (Senokot-S) tablet 1 tablet  1 tablet Oral QHS PRN Demetrios Loll, MD         Discharge Medications:  Medication List     STOP taking these medications   atenolol 25 MG tablet Commonly known as:  TENORMIN     TAKE these medications   benztropine 2 MG tablet Commonly known as:  COGENTIN Take 2 mg by mouth 2 (two) times daily.   bismuth subsalicylate 600 KH/99HF suspension Commonly known as:  PEPTO BISMOL Take 30 mLs by mouth every 6 (six) hours as needed.   cloZAPine 100 MG tablet Commonly known as:  CLOZARIL Take 400-450 mg by mouth See admin instructions. tk 450mg  in the morning and 400 mg at bedtime   desmopressin 0.2 MG tablet Commonly known as:  DDAVP Take 0.2 mg by mouth at bedtime.   escitalopram 10 MG tablet Commonly known as:  LEXAPRO Take 10 mg by mouth daily.   hydrOXYzine 25 MG tablet Commonly known as:  ATARAX/VISTARIL Take 25 mg by mouth at bedtime.   LORazepam 0.5 MG tablet Commonly known as:  ATIVAN Take 1 tablet (0.5 mg total) by mouth 2 (two) times daily as  needed for anxiety. What changed:  when to take this   Melatonin 5 MG Tabs Take 5 mg by mouth at bedtime as needed.   OLANZapine 10 MG tablet Commonly known as:  ZYPREXA Take 10 mg by mouth daily as needed.   Vitamin D-3 5000 units Tabs Take 1 tablet by mouth daily.        Relevant Imaging Results:  Relevant Lab Results:   Additional Information SS# [id number]  Zettie Pho, LCSW

## 2016-09-18 NOTE — Clinical Social Work Note (Signed)
Clinical Social Work Assessment  Patient Details  Name: Austin Gates MRN: [id number] Date of Birth: [birth year]  Date of referral:  09/18/16               Reason for consult:  Facility Placement                Permission sought to share information with:  Facility Art therapist granted to share information::  Yes, Verbal Permission Granted  Name::        Agency::     Relationship::     Contact Information:     Housing/Transportation Living arrangements for the past 2 months:  Group Home Source of Information:  Patient, Facility, Guardian Patient Interpreter Needed:  None Criminal Activity/Legal Involvement Pertinent to Current Situation/Hospitalization:  No - Comment as needed Significant Relationships:  Siblings Lives with:  Facility Resident Do you feel safe going back to the place where you live?  Yes Need for family participation in patient care:  Yes (Comment) (Patient's sister [name] is his legal guardian)  Care giving concerns:  Patient admitted from group home.   Social Worker assessment / plan:  CSW spoke with the patient's sister/Legal Guardian at bedside to discuss discharge planning. The patient is a resident at [facility], and the patient's sister would like him to return after attending church with her. Quinlan Blas of the group home confirmed his residence and agreed with the discharge plan. The CSW informed [name], that the group home staff would contact her after church to discuss when they should pick up the patient. The CSW has prepared all documentation for discharge. CSW will continue to follow pending additional discharge needs.   Employment status:  Retired Forensic scientist:  Medicaid In [place] PT Recommendations:  Not assessed at this time Information / Referral to community resources:     Patient/Family's Response to care:  The patient and his sister thanked the CSW for assistance.  Patient/Family's  Understanding of and Emotional Response to Diagnosis, Current Treatment, and Prognosis:  The patient and his sister are happy for him to return to the group home.  Emotional Assessment Appearance:  Appears older than stated age Attitude/Demeanor/Rapport:   (Pleasant) Affect (typically observed):  Blunt, Calm Orientation:  Oriented to Self, Oriented to Place, Oriented to Situation Alcohol / Substance use:  Never Used Psych involvement (Current and /or in the community):  Yes (Comment) (Patient has outpatient care)  Discharge Needs  Concerns to be addressed:  Care Coordination, Discharge Planning Concerns Readmission within the last 30 days:  No Current discharge risk:  Chronically ill, Psychiatric Illness Barriers to Discharge:  No Barriers Identified   Zettie Pho, LCSW 09/18/2016, 11:48 AM

## 2016-09-18 NOTE — Evaluation (Signed)
Physical Therapy Evaluation Patient Details Name: Austin Gates MRN: [id number] DOB: 1961/02/05 Today's Date: 09/18/2016   History of Present Illness  56 yo male with onset of fall with multiple abrasions has been falling recently.  Has (-) CT of head, orthostasis issue and dizziness, dehydration.  PMHx:  DM, HTN, schizoaffective disorder, vit D deficiency  Clinical Impression  Pt attempted gait first with HHA and was clearly unsteady.  After using the RW it was clear he was not going to feel comfortable maneuvering it in a confined space.  He used SPC and did note some times not placing on the ground, but he was able to steady with it at times.  He is demonstrating some LE strength and ROM losses that are key for balance maintenance and should follow up with HHPT to work on the deficits and standing balance.  Follow acutely if remaining in hosp for the next couple days.    Follow Up Recommendations Home health PT;Supervision for mobility/OOB    Equipment Recommendations  Cane    Recommendations for Other Services       Precautions / Restrictions Precautions Precautions: Fall Restrictions Weight Bearing Restrictions: No      Mobility  Bed Mobility Overal bed mobility: Modified Independent                Transfers Overall transfer level: Modified independent Equipment used: 1 person hand held assist                Ambulation/Gait Ambulation/Gait assistance: Min guard (for safety) Ambulation Distance (Feet): 300 Feet Assistive device: 1 person hand held assist;Straight cane;Rolling walker (2 wheeled) (attempted all three strategies) Gait Pattern/deviations: Step-through pattern;Wide base of support;Drifts right/left Gait velocity: fast Gait velocity interpretation: at or above normal speed for age/gender General Gait Details: easily distracted and note pt leaning suddenly one direction to speak to staff members  Stairs            Wheelchair Mobility     Modified Rankin (Stroke Patients Only)       Balance Overall balance assessment: History of Falls                                           Pertinent Vitals/Pain Pain Assessment: No/denies pain    Home Living Family/patient expects to be discharged to:: Group home                      Prior Function Level of Independence: Needs assistance   Gait / Transfers Assistance Needed: recent falls but not on an AD  ADL's / Homemaking Assistance Needed: has staff for cooking and cleaning        Hand Dominance   Dominant Hand: Right    Extremity/Trunk Assessment   Upper Extremity Assessment Upper Extremity Assessment: Overall WFL for tasks assessed    Lower Extremity Assessment Lower Extremity Assessment: Overall WFL for tasks assessed    Cervical / Trunk Assessment Cervical / Trunk Assessment: Normal  Communication   Communication: No difficulties  Cognition Arousal/Alertness: Awake/alert Behavior During Therapy: Impulsive Overall Cognitive Status: History of cognitive impairments - at baseline                                        General Comments  Exercises Other Exercises Other Exercises: ROM to ankles was 0 deg DF, hips to neutral ext with overall shuffling and flexed posture effect   Assessment/Plan    PT Assessment Patient needs continued PT services  PT Problem List Decreased strength;Decreased range of motion;Decreased activity tolerance;Decreased balance;Decreased mobility;Decreased coordination;Decreased knowledge of use of DME;Decreased safety awareness       PT Treatment Interventions DME instruction;Gait training;Functional mobility training;Therapeutic activities;Therapeutic exercise;Balance training;Neuromuscular re-education;Patient/family education    PT Goals (Current goals can be found in the Care Plan section)  Acute Rehab PT Goals Patient Stated Goal: to walk around PT Goal Formulation:  With patient/family Time For Goal Achievement: 10/02/16 Potential to Achieve Goals: Good    Frequency Min 2X/week   Barriers to discharge   will need follow up therapy as group home staff may not be able to supervise his gait    Co-evaluation               AM-PAC PT "6 Clicks" Daily Activity  Outcome Measure Difficulty turning over in bed (including adjusting bedclothes, sheets and blankets)?: None Difficulty moving from lying on back to sitting on the side of the bed? : None Difficulty sitting down on and standing up from a chair with arms (e.g., wheelchair, bedside commode, etc,.)?: A Little Help needed moving to and from a bed to chair (including a wheelchair)?: A Little Help needed walking in hospital room?: A Little Help needed climbing 3-5 steps with a railing? : A Little 6 Click Score: 20    End of Session Equipment Utilized During Treatment: Gait belt Activity Tolerance: Patient tolerated treatment well Patient left: in bed;with call bell/phone within reach;with nursing/sitter in room;with family/visitor present Nurse Communication: Mobility status PT Visit Diagnosis: Unsteadiness on feet (R26.81);Repeated falls (R29.6);Difficulty in walking, not elsewhere classified (R26.2)    Time: 0623-7628 PT Time Calculation (min) (ACUTE ONLY): 28 min   Charges:   PT Evaluation $PT Eval Low Complexity: 1 Procedure PT Treatments $Gait Training: 8-22 mins   PT G Codes:   PT G-Codes **NOT FOR INPATIENT CLASS** Functional Assessment Tool Used: AM-PAC 6 Clicks Basic Mobility;Clinical judgement Functional Limitation: Mobility: Walking and moving around Mobility: Walking and Moving Around Current Status (B1517): At least 20 percent but less than 40 percent impaired, limited or restricted Mobility: Walking and Moving Around Goal Status [phone]): At least 1 percent but less than 20 percent impaired, limited or restricted    Ramond Dial 09/18/2016, 12:56 PM   Mee Hives, PT  MS Acute Rehab Dept. Number: [place] [facility] [place]

## 2016-09-18 NOTE — Progress Notes (Signed)
Patient is being discharged back to group home. Discharge instructions reviewed with daughter, POA. IV removed with cath intact by Caryl Pina, RN. Packet for group home given to Garvin from [facility]. Allowed time fore questions.

## 2016-09-19 LAB — HEMOGLOBIN A1C
HEMOGLOBIN A1C: 5.6 % (ref 4.8–5.6)
Mean Plasma Glucose: 114 mg/dL

## 2016-09-19 LAB — HIV ANTIBODY (ROUTINE TESTING W REFLEX): HIV Screen 4th Generation wRfx: NONREACTIVE

## 2016-09-29 ENCOUNTER — Encounter: Payer: Self-pay | Admitting: Psychiatry

## 2016-09-29 ENCOUNTER — Emergency Department
Admission: EM | Admit: 2016-09-29 | Discharge: 2016-09-29 | Disposition: A | Discharge status: Home / Self Care | Payer: Medicaid Other | Attending: Emergency Medicine | Admitting: Emergency Medicine

## 2016-09-29 ENCOUNTER — Inpatient Hospital Stay
Admission: AD | Admit: 2016-09-29 | Discharge: 2016-10-07 | DRG: 885 | Disposition: A | Discharge status: Home / Self Care | Payer: Medicaid Other | Source: Intra-hospital | Attending: Psychiatry | Admitting: Psychiatry

## 2016-09-29 ENCOUNTER — Encounter: Payer: Self-pay | Admitting: Emergency Medicine

## 2016-09-29 DIAGNOSIS — F250 Schizoaffective disorder, bipolar type: Secondary | ICD-10-CM | POA: Diagnosis present

## 2016-09-29 DIAGNOSIS — F71 Moderate intellectual disabilities: Secondary | ICD-10-CM | POA: Diagnosis present

## 2016-09-29 DIAGNOSIS — E119 Type 2 diabetes mellitus without complications: Secondary | ICD-10-CM | POA: Insufficient documentation

## 2016-09-29 DIAGNOSIS — F17210 Nicotine dependence, cigarettes, uncomplicated: Secondary | ICD-10-CM | POA: Diagnosis not present

## 2016-09-29 DIAGNOSIS — R569 Unspecified convulsions: Secondary | ICD-10-CM | POA: Diagnosis present

## 2016-09-29 DIAGNOSIS — E559 Vitamin D deficiency, unspecified: Secondary | ICD-10-CM | POA: Diagnosis not present

## 2016-09-29 DIAGNOSIS — K5900 Constipation, unspecified: Secondary | ICD-10-CM | POA: Diagnosis present

## 2016-09-29 DIAGNOSIS — Z833 Family history of diabetes mellitus: Secondary | ICD-10-CM

## 2016-09-29 DIAGNOSIS — I951 Orthostatic hypotension: Secondary | ICD-10-CM | POA: Diagnosis present

## 2016-09-29 DIAGNOSIS — Z7289 Other problems related to lifestyle: Secondary | ICD-10-CM

## 2016-09-29 DIAGNOSIS — F458 Other somatoform disorders: Secondary | ICD-10-CM | POA: Diagnosis not present

## 2016-09-29 DIAGNOSIS — I10 Essential (primary) hypertension: Secondary | ICD-10-CM | POA: Diagnosis present

## 2016-09-29 DIAGNOSIS — Z79899 Other long term (current) drug therapy: Secondary | ICD-10-CM | POA: Diagnosis not present

## 2016-09-29 DIAGNOSIS — T424X5A Adverse effect of benzodiazepines, initial encounter: Secondary | ICD-10-CM | POA: Diagnosis present

## 2016-09-29 DIAGNOSIS — F201 Disorganized schizophrenia: Secondary | ICD-10-CM

## 2016-09-29 DIAGNOSIS — R296 Repeated falls: Secondary | ICD-10-CM | POA: Diagnosis present

## 2016-09-29 DIAGNOSIS — R000 Tachycardia, unspecified: Secondary | ICD-10-CM | POA: Diagnosis present

## 2016-09-29 DIAGNOSIS — I959 Hypotension, unspecified: Secondary | ICD-10-CM | POA: Diagnosis not present

## 2016-09-29 DIAGNOSIS — Z9181 History of falling: Secondary | ICD-10-CM | POA: Diagnosis not present

## 2016-09-29 DIAGNOSIS — R45851 Suicidal ideations: Secondary | ICD-10-CM | POA: Insufficient documentation

## 2016-09-29 DIAGNOSIS — R440 Auditory hallucinations: Secondary | ICD-10-CM | POA: Diagnosis not present

## 2016-09-29 DIAGNOSIS — R451 Restlessness and agitation: Secondary | ICD-10-CM | POA: Diagnosis present

## 2016-09-29 DIAGNOSIS — N3944 Nocturnal enuresis: Secondary | ICD-10-CM | POA: Diagnosis present

## 2016-09-29 DIAGNOSIS — R4182 Altered mental status, unspecified: Secondary | ICD-10-CM | POA: Insufficient documentation

## 2016-09-29 DIAGNOSIS — F209 Schizophrenia, unspecified: Secondary | ICD-10-CM | POA: Diagnosis not present

## 2016-09-29 DIAGNOSIS — R443 Hallucinations, unspecified: Secondary | ICD-10-CM | POA: Diagnosis not present

## 2016-09-29 DIAGNOSIS — F17200 Nicotine dependence, unspecified, uncomplicated: Secondary | ICD-10-CM

## 2016-09-29 DIAGNOSIS — R55 Syncope and collapse: Secondary | ICD-10-CM | POA: Diagnosis not present

## 2016-09-29 LAB — CBC
HCT: 39.5 % — ABNORMAL LOW (ref 40.0–52.0)
HEMOGLOBIN: 13.3 g/dL (ref 13.0–18.0)
MCH: 29.8 pg (ref 26.0–34.0)
MCHC: 33.8 g/dL (ref 32.0–36.0)
MCV: 88.2 fL (ref 80.0–100.0)
PLATELETS: 226 10*3/uL (ref 150–440)
RBC: 4.48 MIL/uL (ref 4.40–5.90)
RDW: 13.6 % (ref 11.5–14.5)
WBC: 5.6 10*3/uL (ref 3.8–10.6)

## 2016-09-29 LAB — URINE DRUG SCREEN, QUALITATIVE (ARMC ONLY)
Amphetamines, Ur Screen: NOT DETECTED
Barbiturates, Ur Screen: NOT DETECTED
Benzodiazepine, Ur Scrn: NOT DETECTED
COCAINE METABOLITE, UR ~~LOC~~: NOT DETECTED
Cannabinoid 50 Ng, Ur ~~LOC~~: NOT DETECTED
MDMA (ECSTASY) UR SCREEN: NOT DETECTED
METHADONE SCREEN, URINE: NOT DETECTED
Opiate, Ur Screen: NOT DETECTED
Phencyclidine (PCP) Ur S: NOT DETECTED
TRICYCLIC, UR SCREEN: NOT DETECTED

## 2016-09-29 LAB — COMPREHENSIVE METABOLIC PANEL
ALT: 18 U/L (ref 17–63)
ANION GAP: 7 (ref 5–15)
AST: 24 U/L (ref 15–41)
Albumin: 4.1 g/dL (ref 3.5–5.0)
Alkaline Phosphatase: 83 U/L (ref 38–126)
BUN: 23 mg/dL — ABNORMAL HIGH (ref 6–20)
CHLORIDE: 105 mmol/L (ref 101–111)
CO2: 27 mmol/L (ref 22–32)
CREATININE: 1.31 mg/dL — AB (ref 0.61–1.24)
Calcium: 9.6 mg/dL (ref 8.9–10.3)
GFR calc non Af Amer: >60 mL/min (ref >60)
Glucose, Bld: 93 mg/dL (ref 65–99)
POTASSIUM: 4.3 mmol/L (ref 3.5–5.1)
SODIUM: 139 mmol/L (ref 135–145)
Total Bilirubin: 0.7 mg/dL (ref 0.3–1.2)
Total Protein: 7.3 g/dL (ref 6.5–8.1)

## 2016-09-29 LAB — URINALYSIS, COMPLETE (UACMP) WITH MICROSCOPIC
BACTERIA UA: NONE SEEN
Bilirubin Urine: NEGATIVE
Glucose, UA: NEGATIVE mg/dL
Hgb urine dipstick: NEGATIVE
Ketones, ur: NEGATIVE mg/dL
LEUKOCYTES UA: NEGATIVE
NITRITE: NEGATIVE
PH: 6 (ref 5.0–8.0)
Protein, ur: NEGATIVE mg/dL
Specific Gravity, Urine: 1.006 (ref 1.005–1.030)

## 2016-09-29 LAB — ACETAMINOPHEN LEVEL

## 2016-09-29 LAB — SALICYLATE LEVEL

## 2016-09-29 LAB — ETHANOL

## 2016-09-29 MED ORDER — BENZTROPINE MESYLATE 2 MG PO TABS
2.0000 mg | ORAL_TABLET | Freq: Two times a day (BID) | ORAL | Status: DC
  Administered 2016-09-29 – 2016-09-30 (×2): 2 mg via ORAL
  Filled 2016-09-29 (×2): qty 1

## 2016-09-29 MED ORDER — OLANZAPINE 10 MG PO TABS
10.0000 mg | ORAL_TABLET | Freq: Every day | ORAL | Status: DC
  Administered 2016-09-29: 10 mg via ORAL
  Filled 2016-09-29: qty 1

## 2016-09-29 MED ORDER — HYDROXYZINE HCL 25 MG PO TABS
25.0000 mg | ORAL_TABLET | Freq: Every day | ORAL | Status: DC
  Administered 2016-09-29: 25 mg via ORAL
  Filled 2016-09-29: qty 1

## 2016-09-29 MED ORDER — CLOZAPINE 25 MG PO TABS: 450.0000 mg | ORAL_TABLET | Freq: Every day | ORAL | Status: DC

## 2016-09-29 MED ORDER — DESMOPRESSIN ACETATE 0.2 MG PO TABS
0.2000 mg | ORAL_TABLET | Freq: Every day | ORAL | Status: DC
  Filled 2016-09-29: qty 1

## 2016-09-29 MED ORDER — ESCITALOPRAM OXALATE 10 MG PO TABS
10.0000 mg | ORAL_TABLET | Freq: Every day | ORAL | Status: DC
  Administered 2016-09-30: 10 mg via ORAL
  Filled 2016-09-29: qty 1

## 2016-09-29 MED ORDER — ESCITALOPRAM OXALATE 10 MG PO TABS: 10.0000 mg | ORAL_TABLET | Freq: Every day | ORAL | Status: DC

## 2016-09-29 MED ORDER — OLANZAPINE 10 MG PO TABS: 10.0000 mg | ORAL_TABLET | Freq: Every day | ORAL | Status: DC

## 2016-09-29 MED ORDER — VITAMIN D 1000 UNITS PO TABS
1000.0000 [IU] | ORAL_TABLET | Freq: Every day | ORAL | Status: DC
  Administered 2016-09-30 – 2016-10-07 (×8): 1000 [IU] via ORAL
  Filled 2016-09-29 (×8): qty 1

## 2016-09-29 MED ORDER — CLOZAPINE 100 MG PO TABS: 400.0000 mg | ORAL_TABLET | Freq: Every day | ORAL | Status: DC

## 2016-09-29 MED ORDER — ACETAMINOPHEN 325 MG PO TABS
650.0000 mg | ORAL_TABLET | Freq: Four times a day (QID) | ORAL | Status: DC | PRN
  Administered 2016-10-02 – 2016-10-03 (×2): 650 mg via ORAL
  Filled 2016-09-29 (×2): qty 2

## 2016-09-29 MED ORDER — LORAZEPAM 0.5 MG PO TABS: 0.5000 mg | ORAL_TABLET | Freq: Four times a day (QID) | ORAL | Status: DC | PRN

## 2016-09-29 MED ORDER — BENZTROPINE MESYLATE 1 MG PO TABS: 2.0000 mg | ORAL_TABLET | Freq: Two times a day (BID) | ORAL | Status: DC

## 2016-09-29 MED ORDER — VITAMIN D 1000 UNITS PO TABS: 1000.0000 [IU] | ORAL_TABLET | Freq: Every day | ORAL | Status: DC

## 2016-09-29 MED ORDER — DESMOPRESSIN ACETATE 0.2 MG PO TABS
0.2000 mg | ORAL_TABLET | Freq: Every day | ORAL | Status: DC
  Administered 2016-09-29 – 2016-10-06 (×8): 0.2 mg via ORAL
  Filled 2016-09-29 (×8): qty 1

## 2016-09-29 MED ORDER — HYDROXYZINE HCL 25 MG PO TABS: 25.0000 mg | ORAL_TABLET | Freq: Every day | ORAL | Status: DC

## 2016-09-29 MED ORDER — ALUM & MAG HYDROXIDE-SIMETH 200-200-20 MG/5ML PO SUSP: 30.0000 mL | ORAL | Status: DC | PRN

## 2016-09-29 MED ORDER — MAGNESIUM HYDROXIDE 400 MG/5ML PO SUSP: 30.0000 mL | Freq: Every day | ORAL | Status: DC | PRN

## 2016-09-29 NOTE — BH Assessment (Signed)
Assessment Note  Austin Gates is an 56 y.o. male who presents to the ER due to his [place] ([name]-[phone]) having concerns about the changes in his behaviors. Patient have been with the [facility] for approximately two years and staff reports of having no problems until now. Approximately a month ago, the patient started "staging fake falls. He'll wait to you get close to someone or make eye contact and hold on to something, then fall on the floor and roll over." However, within the last two weeks, he started using the bathroom on his self. In the middle of the night, he would urinate "on his bed, not in it and in his closet." On today (09/29/2016), he had three bowel movements on his self before 3:30pm. Group Home reports, the only change on they are aware of is his Neuro Psychiatrist, Dr. Tamera Punt, reduced one of his medications. Since then had hasn't slept in two days. He seen his PCP and was prescribed something for sleep but is hasn't worked.  During the interview, the patient reported he hears voices telling him to hurt his self but he have no desire to do so. He admits to hitting his head all the wall at the group home. Per the Group Home, the event took place two to three days ago.  Patient denies HI and V/H.  Diagnosis: Psychotic Disorder & Depression  Past Medical History:  Past Medical History:  Diagnosis Date  . Diabetes mellitus without complication ([place])   . Hypertension     Past Surgical History:  Procedure Laterality Date  . KNEE ARTHROSCOPY      Family History:  Family History  Problem Relation Age of Onset  . Diabetes Mother     Social History:  reports that he has been smoking Cigarettes.  He has been smoking about 1.00 pack per day. He has never used smokeless tobacco. He reports that he does not drink alcohol or use drugs.  Additional Social History:  Alcohol / Drug Use Pain Medications: See PTA Over the Counter: See PTA History of alcohol /  drug use?: No history of alcohol / drug abuse Longest period of sobriety (when/how long): NA Negative Consequences of Use:  (n/a) Withdrawal Symptoms:  (n/a)  CIWA: CIWA-Ar BP: 111/68 Pulse Rate: (!) 104 COWS:    Allergies: No Known Allergies  Home Medications:  (Not in a hospital admission)  OB/GYN Status:  No LMP for male patient.  General Assessment Data Assessment unable to be completed: Yes Location of Assessment: [facility] ED TTS Assessment: In system Is this a Tele or Face-to-Face Assessment?: Face-to-Face Is this an Initial Assessment or a Re-assessment for this encounter?: Initial Assessment Marital status: Single Maiden name: n/a Is patient pregnant?: No Pregnancy Status: No Living Arrangements: Group Home Can pt return to current living arrangement?:  (Unknown) Admission Status: Voluntary Is patient capable of signing voluntary admission?: No (Patient have guardian) Referral Source: Other (from [place]) Insurance type: Medicaid  Medical Screening Exam ([place]) Medical Exam completed: Yes  Crisis Care Plan Living Arrangements: Group Home Legal Guardian: Other relative ("My sisters" Per patient) Name of Psychiatrist: unknown Name of Therapist: unknown  Education Status Is patient currently in school?: No Current Grade: n/a Highest grade of school patient has completed: n/a Name of school: n/a Contact person: n/a  Risk to self with the past 6 months Suicidal Ideation: No Has patient been a risk to self within the past 6 months prior to admission? : No Suicidal  Intent: No Has patient had any suicidal intent within the past 6 months prior to admission? : No Is patient at risk for suicide?: No Suicidal Plan?: No Has patient had any suicidal plan within the past 6 months prior to admission? : No Access to Means: No What has been your use of drugs/alcohol within the last 12 months?: Reports of none Previous Attempts/Gestures: No How many times?:  0 Other Self Harm Risks: Reports of none Triggers for Past Attempts: None known Intentional Self Injurious Behavior: None Family Suicide History: Unknown Recent stressful life event(s): Other (Comment) (A/H with Command) Persecutory voices/beliefs?: Yes Depression: No Depression Symptoms: Guilt Substance abuse history and/or treatment for substance abuse?: No Suicide prevention information given to non-admitted patients: Not applicable  Risk to Others within the past 6 months Homicidal Ideation: No Does patient have any lifetime risk of violence toward others beyond the six months prior to admission? : No Thoughts of Harm to Others: No Current Homicidal Intent: No Current Homicidal Plan: No Access to Homicidal Means: No Identified Victim: Reports of none History of harm to others?: No Assessment of Violence: In past 6-12 months Violent Behavior Description: Hitting head on the wall at the [facility] Does patient have access to weapons?: No Criminal Charges Pending?: No Does patient have a court date: No Is patient on probation?: No  Psychosis Hallucinations: Auditory, With command Delusions: None noted  Mental Status Report Appearance/Hygiene: Unremarkable, In scrubs Eye Contact: Good Motor Activity: Freedom of movement, Unremarkable Speech: Loud, Rapid Level of Consciousness: Alert Mood: Pleasant, Preoccupied Affect: Appropriate to circumstance Anxiety Level: Minimal Thought Processes: Circumstantial, Coherent Judgement: Partial Orientation: Person, Place, Time, Situation, Appropriate for developmental age Obsessive Compulsive Thoughts/Behaviors: Minimal  Cognitive Functioning Concentration: Unable to Assess (Patient's baseline is unknown) Memory: Recent Intact, Remote Intact IQ: Average Insight: Fair Impulse Control: Fair Appetite: Good Weight Loss: 0 Weight Gain: 0 Sleep: No Change Total Hours of Sleep: 8 Vegetative Symptoms: None  ADLScreening [facility]  Assessment Services) Patient's cognitive ability adequate to safely complete daily activities?: Yes Patient able to express need for assistance with ADLs?: Yes Independently performs ADLs?: Yes (appropriate for developmental age)  Prior Inpatient Therapy Prior Inpatient Therapy: Yes Prior Therapy Dates: unknown Prior Therapy Facilty/Provider(s): unknown Reason for Treatment: unknown  Prior Outpatient Therapy Prior Outpatient Therapy: Yes Prior Therapy Dates: unknown Prior Therapy Facilty/Provider(s): unknown Reason for Treatment: unknown Does patient have an ACCT team?: Unknown Does patient have Intensive In-House Services?  : No Does patient have [facility] services? : No Does patient have P4CC services?: No  ADL Screening (condition at time of admission) Patient's cognitive ability adequate to safely complete daily activities?: Yes Is the patient deaf or have difficulty hearing?: No Does the patient have difficulty seeing, even when wearing glasses/contacts?: No Does the patient have difficulty concentrating, remembering, or making decisions?: No Patient able to express need for assistance with ADLs?: Yes Does the patient have difficulty dressing or bathing?: No Independently performs ADLs?: Yes (appropriate for developmental age) Does the patient have difficulty walking or climbing stairs?: No Weakness of Legs: None Weakness of Arms/Hands: None  Home Assistive Devices/Equipment Home Assistive Devices/Equipment: None  Therapy Consults (therapy consults require a physician order) PT Evaluation Needed: No OT Evalulation Needed: No SLP Evaluation Needed: No Abuse/Neglect Assessment (Assessment to be complete while patient is alone) Physical Abuse: Denies Verbal Abuse: Denies Sexual Abuse: Denies Exploitation of patient/patient's resources: Denies Self-Neglect: Denies Values / Beliefs Cultural Requests During Hospitalization: None Spiritual Requests During Hospitalization:  None Consults Spiritual Care Consult Needed: No Social Work Consult Needed: No      Additional Information 1:1 In Past 12 Months?: No CIRT Risk: No Elopement Risk: No Does patient have medical clearance?: Yes  Child/Adolescent Assessment Running Away Risk: Denies (Patient is an adult)  Disposition:  Disposition Initial Assessment Completed for this Encounter: Yes Disposition of Patient: Other dispositions (ER MD Ordered Psych Consult)  On Site Evaluation by:   Reviewed with Physician:    Gunnar Fusi MS, LCAS, [place], [place], CCSI Therapeutic Triage Specialist 09/29/2016 8:42 PM

## 2016-09-29 NOTE — Progress Notes (Signed)
Patient presents as a 56 Y.O male who lives in a group home. Presented to ED secondary to increased agitations and disorganized behaviors: Has been defecating on himself, unable to keep up with hygiene. Intrusive and restless. Patient mumbles and talks continuously. Reports that he is being mistreated at the group home and does not want to go back there. Skin assessment performed by this Probation officer, staffed with Britt Bolognese, RN. There is a movable, dark noddle on his upper back. Pt stated he does not know what it is. Patient is admitted and oriented to the unit and safety precautions initiated. Meal provided and medication regime initiated. MD will evaluate in AM.

## 2016-09-29 NOTE — ED Provider Notes (Signed)
[facility] Emergency Department Provider Note  ____________________________________________  Time seen: Approximately 6:30 PM  I have reviewed the triage vital signs and the nursing notes.   HISTORY  Chief Complaint Suicidal and Altered Mental Status  Level 5 Caveat: Portions of the History and Physical are unable to be obtained due to patient being a poor historian   HPI Austin Gates is a 56 y.o. male brought to the ED due to self-injurious behavior at his group home including urinating and defecating on himself without going to the bathroom and banging his head on the wall. He reports he was hearing voices that told him to bang his head on the wall, and reports that he was feeling suicidal and wanting to kill himself at that time. Reports that he is not currently hearing voices but does have suicidal ideation. He denies HI. Denies visual hallucinations. Denies any ingestions or change in medications. No pain anywhere. No fevers chills or sweats. This issue is acute, severe, no aggravating or alleviating factors according the patient. Constant since onset earlier today.     Past Medical History:  Diagnosis Date  . Diabetes mellitus without complication ([place])   . Hypertension      Patient Active Problem List   Diagnosis Date Noted  . Orthostatic hypotension 09/17/2016     Past Surgical History:  Procedure Laterality Date  . KNEE ARTHROSCOPY       Prior to Admission medications   Medication Sig Start Date End Date Taking? Authorizing Provider  benztropine (COGENTIN) 2 MG tablet Take 2 mg by mouth 2 (two) times daily.    [provider]  bismuth subsalicylate (PEPTO BISMOL) 262 MG/15ML suspension Take 30 mLs by mouth every 6 (six) hours as needed.    [provider]  Cholecalciferol (VITAMIN D-3) 5000 units TABS Take 1 tablet by mouth daily.    [provider]  cloZAPine (CLOZARIL) 100 MG tablet Take 400-450 mg by  mouth See admin instructions. tk 450mg  in the morning and 400 mg at bedtime    [provider]  desmopressin (DDAVP) 0.2 MG tablet Take 0.2 mg by mouth at bedtime.    [provider]  escitalopram (LEXAPRO) 10 MG tablet Take 10 mg by mouth daily.    [provider]  hydrOXYzine (ATARAX/VISTARIL) 25 MG tablet Take 25 mg by mouth at bedtime.    [provider]  LORazepam (ATIVAN) 0.5 MG tablet Take 1 tablet (0.5 mg total) by mouth 2 (two) times daily as needed for anxiety. 09/18/16 09/18/17  Gladstone Lighter, MD  Melatonin 5 MG TABS Take 5 mg by mouth at bedtime as needed.    [provider]  OLANZapine (ZYPREXA) 10 MG tablet Take 10 mg by mouth daily as needed.    [provider]     Allergies Patient has no known allergies.   Family History  Problem Relation Age of Onset  . Diabetes Mother     Social History Social History  Substance Use Topics  . Smoking status: Current Every Day Smoker    Packs/day: 1.00    Types: Cigarettes  . Smokeless tobacco: Never Used  . Alcohol use No    Review of Systems  Constitutional:   No fever or chills.  ENT:   No sore throat. No rhinorrhea. Cardiovascular:   No chest pain or syncope. Respiratory:   No dyspnea or cough. Gastrointestinal:   Negative for abdominal pain, vomiting and diarrhea.  Musculoskeletal:   Negative for focal  pain or swelling All other systems reviewed and are negative except as documented above in ROS and HPI.  ____________________________________________   PHYSICAL EXAM:  VITAL SIGNS: ED Triage Vitals [09/29/16 1539]  Enc Vitals Group     BP 117/83     Pulse Rate (!) 110     Resp 18     Temp (!) 97.5 F (36.4 C)     Temp Source Oral     SpO2 98 %     Weight      Height      Head Circumference      Peak Flow      Pain Score      Pain Loc      Pain Edu?      Excl. in [place]?     Vital signs reviewed, nursing assessments reviewed.   Constitutional:    Alert and orientedTo person and place. Well appearing and in no distress. Eyes:   No scleral icterus.  EOMI. No nystagmus. No conjunctival pallor. PERRL. ENT   Head:   Normocephalic with small abrasion superficially over the bridge of the nose.   Nose:   No congestion/rhinnorhea. No epistaxis or septal hematoma   Mouth/Throat:   MMM, no pharyngeal erythema. No peritonsillar mass. No intraoral injury   Neck:   No meningismus. Full ROM Hematological/Lymphatic/Immunilogical:   No cervical lymphadenopathy. Cardiovascular:   RRR. Symmetric bilateral radial and DP pulses.  No murmurs.  Respiratory:   Normal respiratory effort without tachypnea/retractions. Breath sounds are clear and equal bilaterally. No wheezes/rales/rhonchi. Gastrointestinal:   Soft and nontender. Non distended. There is no CVA tenderness.  No rebound, rigidity, or guarding. Genitourinary:   deferred Musculoskeletal:   Normal range of motion in all extremities. No joint effusions.  No lower extremity tenderness.  No edema. Neurologic:   Normal speech and language.  Motor grossly intact. No gross focal neurologic deficits are appreciated.  Skin:    Skin is warm, dry and intact. No rash noted.  No petechiae, purpura, or bullae.  ____________________________________________    LABS (pertinent positives/negatives) (all labs ordered are listed, but only abnormal results are displayed) Labs Reviewed  COMPREHENSIVE METABOLIC PANEL - Abnormal; Notable for the following:       Result Value   BUN 23 (*)    Creatinine, Ser 1.31 (*)    All other components within normal limits  ACETAMINOPHEN LEVEL - Abnormal; Notable for the following:    Acetaminophen (Tylenol), Serum <10 (*)    All other components within normal limits  CBC - Abnormal; Notable for the following:    HCT 39.5 (*)    All other components within normal limits  ETHANOL  SALICYLATE LEVEL  URINE DRUG SCREEN, QUALITATIVE ([facility] ONLY)  URINALYSIS,  COMPLETE (UACMP) WITH MICROSCOPIC   ____________________________________________   EKG    ____________________________________________    RADIOLOGY  No results found.  ____________________________________________   PROCEDURES Procedures  ____________________________________________   INITIAL IMPRESSION / ASSESSMENT AND PLAN / ED COURSE  Pertinent labs & imaging results that were available during my care of the patient were reviewed by me and considered in my medical decision making (see chart for details).  Patient well appearing no acute distress, medically stable and clear for psychiatric evaluation. Case discussed with Dr. Weber Cooks a psychiatry after his assessment of the patient, feel the patient warrants hospitalization due to lack of a clear diagnosis to explain his symptoms for further psychiatric stabilization. We'll await room assignment on the behavioral medicine floor. No  serious traumatic injuries at this time. Patient is eating, sitting upright, smiling, comfortable and interactive.      ____________________________________________   FINAL CLINICAL IMPRESSION(S) / ED DIAGNOSES  Final diagnoses:  Suicidal ideation  Self-injurious behavior      New Prescriptions   No medications on file     Portions of this note were generated with dragon dictation software. Dictation errors may occur despite best attempts at proofreading.    Carrie Mew, MD 09/29/16 [phone]

## 2016-09-29 NOTE — ED Notes (Signed)
Patient is currently eating the contents of a [place] sandwich tray.

## 2016-09-29 NOTE — Consult Note (Signed)
[facility] Consult   Reason for Consult:  Consult for 56 year old man with schizophrenia brought to the emergency room because of self injuring behavior Referring Physician:  Joni Fears Patient Identification: Austin Gates MRN:  [id number] Principal Diagnosis: Schizophrenia [facility]) Diagnosis:   Patient Active Problem List   Diagnosis Date Noted  . Schizophrenia ([place]) [F20.9] 09/29/2016  . Orthostatic hypotension [I95.1] 09/17/2016    Total Time spent with patient: 1 hour  Subjective:   Austin Gates is a 56 y.o. male patient admitted with "I'm a good man".  HPI:  Patient interviewed chart reviewed. 56 year old man was brought here by his caregiver with reports that he had been pounding his head against the wall. Patient was cooperative with the interview but was disorganized and difficult to follow. He insisted repeatedly that he was a good person and was not trying to hurt anybody. He admitted freely however that he had been punching his fist into the wall putting holes in the drywall and also banging his head against the wall. At one point he said that he was banging his head because he wanted to kill himself but on other occasions he denied suicidal ideation. His answers to questions were really not consistent. Affect constricted and odd and inappropriate. Behavior during the interview at least was calm and nonthreatening. Patient states he takes his medicine regularly. He denies having any complaints about his group home. Denies any substance abuse.  Social history: Little information available right now. Patient obviously has chronic mental illness and has been living in what sounds like a environment with only one or 2 other clients and several staff members. He tells me he does have some family living in [place].  Medical history: Patient was admitted to the hospital here fairly recently for hypotension although it was eventually determined to probably not be a  real problem. No other active ongoing medical issues identified other than his clozapine use.  Substance abuse history: Patient denies any and there is no evidence of any substance abuse currently  Past Psychiatric History: We don't have much history. It's been identified that he has schizophrenia but even the care everywhere notes don't give [place] much information. Patient is on a very high dose of clozapine as well as when necessary doses of Zyprexa. He is able to name every state hospital in [place] and says that he has been admitted to all of them. He denies being violent others admits that he has tried to kill himself in the past but is unable to give clarification  Risk to Self: Is patient at risk for suicide?: Yes Risk to Others:   Prior Inpatient Therapy:   Prior Outpatient Therapy:    Past Medical History:  Past Medical History:  Diagnosis Date  . Diabetes mellitus without complication ([place])   . Hypertension     Past Surgical History:  Procedure Laterality Date  . KNEE ARTHROSCOPY     Family History:  Family History  Problem Relation Age of Onset  . Diabetes Mother    Family Psychiatric  History: Not able to give any history Social History:  History  Alcohol Use No     History  Drug Use No    Social History   Social History  . Marital status: Single    Spouse name: N/A  . Number of children: N/A  . Years of education: N/A   Social History Main Topics  . Smoking status: Current Every Day Smoker    Packs/day: 1.00  Types: Cigarettes  . Smokeless tobacco: Never Used  . Alcohol use No  . Drug use: No  . Sexual activity: No   Other Topics Concern  . None   Social History Narrative  . None   Additional Social History:    Allergies:  No Known Allergies  Labs:  Results for orders placed or performed during the hospital encounter of 09/29/16 (from the past 48 hour(s))  Comprehensive metabolic panel     Status: Abnormal   Collection Time: 09/29/16   3:46 PM  Result Value Ref Range   Sodium 139 135 - 145 mmol/L   Potassium 4.3 3.5 - 5.1 mmol/L   Chloride 105 101 - 111 mmol/L   CO2 27 22 - 32 mmol/L   Glucose, Bld 93 65 - 99 mg/dL   BUN 23 (H) 6 - 20 mg/dL   Creatinine, Ser 1.31 (H) 0.61 - 1.24 mg/dL   Calcium 9.6 8.9 - 10.3 mg/dL   Total Protein 7.3 6.5 - 8.1 g/dL   Albumin 4.1 3.5 - 5.0 g/dL   AST 24 15 - 41 U/L   ALT 18 17 - 63 U/L   Alkaline Phosphatase 83 38 - 126 U/L   Total Bilirubin 0.7 0.3 - 1.2 mg/dL   GFR calc non Af Amer >60 >60 mL/min   GFR calc Af Amer >60 >60 mL/min    Comment: (NOTE) The eGFR has been calculated using the CKD EPI equation. This calculation has not been validated in all clinical situations. eGFR's persistently <60 mL/min signify possible Chronic Kidney Disease.    Anion gap 7 5 - 15  Ethanol     Status: None   Collection Time: 09/29/16  3:46 PM  Result Value Ref Range   Alcohol, Ethyl (B) <5 <5 mg/dL    Comment:        LOWEST DETECTABLE LIMIT FOR SERUM ALCOHOL IS 5 mg/dL FOR MEDICAL PURPOSES ONLY   Salicylate level     Status: None   Collection Time: 09/29/16  3:46 PM  Result Value Ref Range   Salicylate Lvl <1.9 2.8 - 30.0 mg/dL  Acetaminophen level     Status: Abnormal   Collection Time: 09/29/16  3:46 PM  Result Value Ref Range   Acetaminophen (Tylenol), Serum <10 (L) 10 - 30 ug/mL    Comment:        THERAPEUTIC CONCENTRATIONS VARY SIGNIFICANTLY. A RANGE OF 10-30 ug/mL MAY BE AN EFFECTIVE CONCENTRATION FOR MANY PATIENTS. HOWEVER, SOME ARE BEST TREATED AT CONCENTRATIONS OUTSIDE THIS RANGE. ACETAMINOPHEN CONCENTRATIONS >150 ug/mL AT 4 HOURS AFTER INGESTION AND >50 ug/mL AT 12 HOURS AFTER INGESTION ARE OFTEN ASSOCIATED WITH TOXIC REACTIONS.   cbc     Status: Abnormal   Collection Time: 09/29/16  3:46 PM  Result Value Ref Range   WBC 5.6 3.8 - 10.6 K/uL   RBC 4.48 4.40 - 5.90 MIL/uL   Hemoglobin 13.3 13.0 - 18.0 g/dL   HCT 39.5 (L) 40.0 - 52.0 %   MCV 88.2 80.0 - 100.0  fL   MCH 29.8 26.0 - 34.0 pg   MCHC 33.8 32.0 - 36.0 g/dL   RDW 13.6 11.5 - 14.5 %   Platelets 226 150 - 440 K/uL  Urine Drug Screen, Qualitative     Status: None   Collection Time: 09/29/16  3:46 PM  Result Value Ref Range   Tricyclic, Ur Screen NONE DETECTED NONE DETECTED   Amphetamines, Ur Screen NONE DETECTED NONE DETECTED   MDMA (Ecstasy)Ur Screen NONE  DETECTED NONE DETECTED   Cocaine Metabolite,Ur [place] NONE DETECTED NONE DETECTED   Opiate, Ur Screen NONE DETECTED NONE DETECTED   Phencyclidine (PCP) Ur S NONE DETECTED NONE DETECTED   Cannabinoid 50 Ng, Ur [place] NONE DETECTED NONE DETECTED   Barbiturates, Ur Screen NONE DETECTED NONE DETECTED   Benzodiazepine, Ur Scrn NONE DETECTED NONE DETECTED   Methadone Scn, Ur NONE DETECTED NONE DETECTED    Comment: (NOTE) 782  Tricyclics, urine               Cutoff 1000 ng/mL 200  Amphetamines, urine             Cutoff 1000 ng/mL 300  MDMA (Ecstasy), urine           Cutoff 500 ng/mL 400  Cocaine Metabolite, urine       Cutoff 300 ng/mL 500  Opiate, urine                   Cutoff 300 ng/mL 600  Phencyclidine (PCP), urine      Cutoff 25 ng/mL 700  Cannabinoid, urine              Cutoff 50 ng/mL 800  Barbiturates, urine             Cutoff 200 ng/mL 900  Benzodiazepine, urine           Cutoff 200 ng/mL 1000 Methadone, urine                Cutoff 300 ng/mL 1100 1200 The urine drug screen provides only a preliminary, unconfirmed 1300 analytical test result and should not be used for non-medical 1400 purposes. Clinical consideration and professional judgment should 1500 be applied to any positive drug screen result due to possible 1600 interfering substances. A more specific alternate chemical method 1700 must be used in order to obtain a confirmed analytical result.  1800 Gas chromato graphy / mass spectrometry (GC/MS) is the preferred 1900 confirmatory method.   Urinalysis, Complete w Microscopic     Status: Abnormal   Collection Time:  09/29/16  3:46 PM  Result Value Ref Range   Color, Urine YELLOW (A) YELLOW   APPearance CLEAR (A) CLEAR   Specific Gravity, Urine 1.006 1.005 - 1.030   pH 6.0 5.0 - 8.0   Glucose, UA NEGATIVE NEGATIVE mg/dL   Hgb urine dipstick NEGATIVE NEGATIVE   Bilirubin Urine NEGATIVE NEGATIVE   Ketones, ur NEGATIVE NEGATIVE mg/dL   Protein, ur NEGATIVE NEGATIVE mg/dL   Nitrite NEGATIVE NEGATIVE   Leukocytes, UA NEGATIVE NEGATIVE   RBC / HPF 0-5 0 - 5 RBC/hpf   WBC, UA 0-5 0 - 5 WBC/hpf   Bacteria, UA NONE SEEN NONE SEEN   Squamous Epithelial / LPF 0-5 (A) NONE SEEN   Sperm, UA PRESENT     Current Facility-Administered Medications  Medication Dose Route Frequency Provider Last Rate Last Dose  . benztropine (COGENTIN) tablet 2 mg  2 mg Oral BID Shanera Meske T, MD      . cholecalciferol (VITAMIN D) tablet 1,000 Units  1,000 Units Oral Daily Mckinnon Glick T, MD      . cloZAPine (CLOZARIL) tablet 400 mg  400 mg Oral QHS Wednesday Ericsson T, MD      . cloZAPine (CLOZARIL) tablet 450 mg  450 mg Oral Daily Ala Kratz T, MD      . desmopressin (DDAVP) tablet 0.2 mg  0.2 mg Oral QHS Alda Gaultney, [name], MD      .  escitalopram (LEXAPRO) tablet 10 mg  10 mg Oral Daily Siddhartha Hoback T, MD      . hydrOXYzine (ATARAX/VISTARIL) tablet 25 mg  25 mg Oral QHS Jovonda Selner T, MD      . LORazepam (ATIVAN) tablet 0.5 mg  0.5 mg Oral Q6H PRN Donnette Macmullen T, MD      . OLANZapine (ZYPREXA) tablet 10 mg  10 mg Oral QHS Zari Cly, [name], MD       Current Outpatient Prescriptions  Medication Sig Dispense Refill  . benztropine (COGENTIN) 2 MG tablet Take 2 mg by mouth 2 (two) times daily.    Marland Kitchen bismuth subsalicylate (PEPTO BISMOL) 262 MG/15ML suspension Take 30 mLs by mouth every 6 (six) hours as needed.    . Cholecalciferol (VITAMIN D-3) 5000 units TABS Take 1 tablet by mouth daily.    . cloZAPine (CLOZARIL) 100 MG tablet Take 400-450 mg by mouth See admin instructions. tk 473m in the morning and 400 mg at bedtime    .  desmopressin (DDAVP) 0.2 MG tablet Take 0.2 mg by mouth at bedtime.    .Marland Kitchenescitalopram (LEXAPRO) 10 MG tablet Take 10 mg by mouth daily.    . hydrOXYzine (ATARAX/VISTARIL) 25 MG tablet Take 25 mg by mouth at bedtime.    .Marland KitchenLORazepam (ATIVAN) 0.5 MG tablet Take 1 tablet (0.5 mg total) by mouth 2 (two) times daily as needed for anxiety. 20 tablet 0  . Melatonin 5 MG TABS Take 5 mg by mouth at bedtime as needed.    .Marland KitchenOLANZapine (ZYPREXA) 10 MG tablet Take 10 mg by mouth daily as needed.      Musculoskeletal: Strength & Muscle Tone: within normal limits Gait & Station: normal Patient leans: N/A  Psychiatric Specialty Exam: Physical Exam  Nursing note and vitals reviewed. Constitutional: He appears well-developed and well-nourished.  HENT:  Head: Normocephalic and atraumatic.  Eyes: Pupils are equal, round, and reactive to light. Conjunctivae are normal.  Neck: Normal range of motion.  Cardiovascular: Regular rhythm and normal heart sounds.   Respiratory: Effort normal. No respiratory distress.  GI: Soft.  Musculoskeletal: Normal range of motion.  Neurological: He is alert.  Skin: Skin is warm and dry.  Psychiatric: His affect is blunt and inappropriate. His speech is delayed and tangential. He is slowed. He is not agitated. Thought content is delusional. Cognition and memory are impaired. He expresses impulsivity. He expresses no homicidal and no suicidal ideation.    Review of Systems  Constitutional: Negative.   HENT: Negative.   Eyes: Negative.   Respiratory: Negative.   Cardiovascular: Negative.   Gastrointestinal: Negative.   Musculoskeletal: Negative.   Skin: Negative.   Neurological: Negative.   Psychiatric/Behavioral: Positive for hallucinations and memory loss. Negative for depression, substance abuse and suicidal ideas. The patient is not nervous/anxious and does not have insomnia.     Blood pressure 111/68, pulse (!) 104, temperature 98.5 F (36.9 C), temperature source  Oral, resp. rate 18, SpO2 100 %.There is no height or weight on file to calculate BMI.  General Appearance: Disheveled  Eye Contact:  Fair  Speech:  Garbled  Volume:  Decreased  Mood:  Euthymic  Affect:  Blunt  Thought Process:  Disorganized  Orientation:  Negative  Thought Content:  Illogical, Rumination and Tangential  Suicidal Thoughts:  Yes.  without intent/plan  Homicidal Thoughts:  No  Memory:  Immediate;   Fair Recent;   Poor Remote;   Fair  Judgement:  Impaired  Insight:  Shallow  Psychomotor Activity:  Decreased  Concentration:  Concentration: Poor  Recall:  Poor  Fund of Knowledge:  Fair  Language:  Fair  Akathisia:  No  Handed:  Right  AIMS (if indicated):     Assets:  Desire for Improvement Housing Resilience  ADL's:  Impaired  Cognition:  Impaired,  Mild  Sleep:        Treatment Plan Summary: Daily contact with patient to assess and evaluate symptoms and progress in treatment, Medication management and Plan This appears to be a 56 year old man with chronic mental illness most likely schizophrenia. Behavior seems to have escalated to a point of dangerousness at home. During the interview in the emergency room however the patient has been calm not threatening not behaving in a violent manner and has been cooperative with the workup. He appears to be on high-dose clozapine. I proposed to the patient that given the lack of other information we have about him and that we have been familiar with his history in the past as well as the fact that he is admitting to pounding his head against walls that admitting him to the hospital would probably be safer for now. Patient agrees to the plan. Continue current medicine. Check clozapine level in the morning. When necessary medicines available. Full set of labs to be obtained. Case reviewed with TTS and emergency room  Disposition: Recommend psychiatric Inpatient admission when medically cleared. Supportive therapy provided about  ongoing stressors.  Alethia Berthold, MD 09/29/2016 8:03 PM

## 2016-09-29 NOTE — BH Assessment (Signed)
Writer spoke with patient's legal guardian/sister-(Vickery Frasier-[phone]) receive verbal consent for the patient to be admitted to [facility].

## 2016-09-29 NOTE — ED Notes (Signed)
Patient is currently eating the food that came from the cafeteria.

## 2016-09-29 NOTE — ED Notes (Signed)
[name] and TTS Calvin at bedside

## 2016-09-29 NOTE — ED Triage Notes (Signed)
Pt brought in by caregiver with reports of having some altered mental changes in behavior such as defacating on himself and urinating on himself, banging his head up against the wall. Caregiver unsure if pt has a psych hx. Pt states when he bangs his head up against the wall he is trying to harm himself. Caregiver states aggressive behavior against women at the home.

## 2016-09-29 NOTE — BH Assessment (Signed)
Patient is to be admitted to [facility] by Dr. Weber Cooks.  Attending Physician will be Dr. Jerilee Hoh.   Patient has been assigned to room 313, by [place].

## 2016-09-30 ENCOUNTER — Encounter: Payer: Self-pay | Admitting: Psychiatry

## 2016-09-30 DIAGNOSIS — F71 Moderate intellectual disabilities: Secondary | ICD-10-CM

## 2016-09-30 DIAGNOSIS — F17200 Nicotine dependence, unspecified, uncomplicated: Secondary | ICD-10-CM

## 2016-09-30 DIAGNOSIS — F209 Schizophrenia, unspecified: Secondary | ICD-10-CM

## 2016-09-30 LAB — LIPID PANEL
Cholesterol: 135 mg/dL (ref 0–200)
HDL: 60 mg/dL (ref >40)
LDL CALC: 63 mg/dL (ref 0–99)
Total CHOL/HDL Ratio: 2.3 RATIO
Triglycerides: 62 mg/dL (ref <150)
VLDL: 12 mg/dL (ref 0–40)

## 2016-09-30 LAB — CBC WITH DIFFERENTIAL/PLATELET
Basophils Absolute: 0.1 10*3/uL (ref 0–0.1)
Basophils Relative: 1 %
EOS ABS: 0.2 10*3/uL (ref 0–0.7)
EOS PCT: 3 %
HCT: 39.6 % — ABNORMAL LOW (ref 40.0–52.0)
HEMOGLOBIN: 13.2 g/dL (ref 13.0–18.0)
LYMPHS ABS: 0.9 10*3/uL — AB (ref 1.0–3.6)
LYMPHS PCT: 17 %
MCH: 29.6 pg (ref 26.0–34.0)
MCHC: 33.3 g/dL (ref 32.0–36.0)
MCV: 89.1 fL (ref 80.0–100.0)
Monocytes Absolute: 0.3 10*3/uL (ref 0.2–1.0)
Monocytes Relative: 6 %
Neutro Abs: 3.8 10*3/uL (ref 1.4–6.5)
Neutrophils Relative %: 73 %
Platelets: 222 10*3/uL (ref 150–440)
RBC: 4.45 MIL/uL (ref 4.40–5.90)
RDW: 13.6 % (ref 11.5–14.5)
WBC: 5.2 10*3/uL (ref 3.8–10.6)

## 2016-09-30 LAB — HEMOGLOBIN A1C
HEMOGLOBIN A1C: 5.4 % (ref 4.8–5.6)
MEAN PLASMA GLUCOSE: 108.28 mg/dL

## 2016-09-30 LAB — TSH: TSH: 0.047 u[IU]/mL — AB (ref 0.350–4.500)

## 2016-09-30 MED ORDER — POLYETHYLENE GLYCOL 3350 17 G PO PACK
17.0000 g | PACK | Freq: Every day | ORAL | Status: DC
  Administered 2016-10-02 – 2016-10-07 (×4): 17 g via ORAL
  Filled 2016-09-30 (×4): qty 1

## 2016-09-30 MED ORDER — OLANZAPINE 5 MG PO TBDP: 10.0000 mg | ORAL_TABLET | Freq: Two times a day (BID) | ORAL | Status: DC | PRN

## 2016-09-30 MED ORDER — CLOZAPINE 100 MG PO TABS
400.0000 mg | ORAL_TABLET | Freq: Every day | ORAL | Status: DC
  Administered 2016-09-30 – 2016-10-02 (×3): 400 mg via ORAL
  Filled 2016-09-30 (×3): qty 4

## 2016-09-30 MED ORDER — CLOZAPINE 100 MG PO TABS
200.0000 mg | ORAL_TABLET | Freq: Every day | ORAL | Status: DC
  Administered 2016-09-30 – 2016-10-03 (×4): 200 mg via ORAL
  Filled 2016-09-30 (×4): qty 2

## 2016-09-30 MED ORDER — DOCUSATE SODIUM 100 MG PO CAPS
100.0000 mg | ORAL_CAPSULE | Freq: Two times a day (BID) | ORAL | Status: DC
  Administered 2016-09-30 – 2016-10-03 (×6): 100 mg via ORAL
  Filled 2016-09-30 (×6): qty 1

## 2016-09-30 MED ORDER — CLOZAPINE 100 MG PO TABS: 300.0000 mg | ORAL_TABLET | Freq: Every day | ORAL | Status: DC

## 2016-09-30 MED ORDER — LORAZEPAM 2 MG PO TABS: 2.0000 mg | ORAL_TABLET | ORAL | Status: DC | PRN

## 2016-09-30 MED ORDER — OLANZAPINE 10 MG IM SOLR: 10.0000 mg | Freq: Two times a day (BID) | INTRAMUSCULAR | Status: DC | PRN

## 2016-09-30 MED ORDER — DIVALPROEX SODIUM 500 MG PO DR TAB
500.0000 mg | DELAYED_RELEASE_TABLET | Freq: Two times a day (BID) | ORAL | Status: DC
  Administered 2016-09-30 – 2016-10-04 (×9): 500 mg via ORAL
  Filled 2016-09-30 (×9): qty 1

## 2016-09-30 MED ORDER — METOPROLOL TARTRATE 25 MG PO TABS
12.5000 mg | ORAL_TABLET | Freq: Two times a day (BID) | ORAL | Status: DC
  Administered 2016-09-30 – 2016-10-04 (×8): 12.5 mg via ORAL
  Filled 2016-09-30 (×8): qty 1

## 2016-09-30 NOTE — BHH Counselor (Addendum)
CSW spoke to sister and legal guardian Catalina Gravel, [phone].  Chrys Racer was aware that pt was hospitalized.  She was not able to talk with CSW to complete PSA at this time but said she would be available later today. Guardianship paperwork requested and Chrys Racer agreed to bring it when she comes to visit, as well as to sign releases at that time.   Winferd Humphrey, MSW, LCSW Clinical Social Worker 09/30/2016 11:20 AM

## 2016-09-30 NOTE — Plan of Care (Signed)
Problem: Activity: Goal: Sleeping patterns will improve Patient will be able to sleep at 6 hours each night Outcome: Progressing Patient went to bed after taking a shower and has been sleeping without interruptions

## 2016-09-30 NOTE — Progress Notes (Signed)
Patient is newly admitted. Pt stayed in the dayroom with staff and peers. Disorganized, laud, talkative. Experiencing poor insight. Admitted to hearing voices and seeing things. Patient took a shower per nursing encouragements. Went to bed and has been sleeping. Will be evaluated by MD this morning. Safety precautions maintained.

## 2016-09-30 NOTE — Progress Notes (Signed)
Clozaril Pharmacy Monitoring:  56 yo male on Clozapine 200mg  qam and 300mg  at bedtime.   8/10 ANC  3.8  Lab submitted to Clozapine REMS program. Continue to monitor ANC weekly while inpatient. Next [place] due 10/07/16.  Chinita Greenland PharmD Clinical Pharmacist 09/30/2016

## 2016-09-30 NOTE — Plan of Care (Signed)
Problem: Activity: Goal: Interest or engagement in activities will improve Patient will be able to participate in unit activities by day 2 of admission Outcome: Not Progressing Patient disorganized, psychotic with poor insight

## 2016-09-30 NOTE — BHH Group Notes (Signed)
[facility] LCSW Group Therapy Note  Date/Time: 09/30/16, 1300  Type of Therapy and Topic:  Group Therapy:  Feelings around Relapse and Recovery  Participation Level:  Active   Mood: inappropriate  Description of Group:    Patients in this group will discuss emotions they experience before and after a relapse. They will process how experiencing these feelings, or avoidance of experiencing them, relates to having a relapse. Facilitator will guide patients to explore emotions they have related to recovery. Patients will be encouraged to process which emotions are more powerful. They will be guided to discuss the emotional reaction significant others in their lives may have to patients' relapse or recovery. Patients will be assisted in exploring ways to respond to the emotions of others without this contributing to a relapse.  Therapeutic Goals: 1. Patient will identify two or more emotions that lead to relapse for them:  2. Patient will identify two emotions that result when they relapse:  3. Patient will identify two emotions related to recovery:  4. Patient will demonstrate ability to communicate their needs through discussion and/or role plays.   Summary of Patient Progress: Pt came to group a little late and was inappropriate throughout.  Pt trying to touch CSW, grab CSW name tag, shake hands repeatedly.  Pt showed no ability to focus on the group topic frequently interjecting/interupting: "I like baskeball."  And other comments.  CSW redirected pt repeatedly and pt would remain quiet briefly.  Pt was redirectable.     Therapeutic Modalities:   Cognitive Behavioral Therapy Solution-Focused Therapy Assertiveness Training Relapse Prevention Therapy  Lurline Idol, LCSW

## 2016-09-30 NOTE — Plan of Care (Signed)
Problem: Role Relationship: Goal: Ability to communicate needs accurately will improve Pt will be able to communicate his thoughts and feelings within 2 days Outcome: Progressing Patient maintains appropriate behavior per staff encouragements

## 2016-09-30 NOTE — Tx Team (Signed)
Interdisciplinary Treatment and Diagnostic Plan Update  09/30/2016 Time of Session: 11:00 AM Austin Gates MRN: [id number]  Principal Diagnosis: Schizophrenia [facility])  Secondary Diagnoses: Principal Problem:   Schizophrenia ([place]) Active Problems:   Tobacco use disorder   Current Medications:  Current Facility-Administered Medications  Medication Dose Route Frequency Provider Last Rate Last Dose  . acetaminophen (TYLENOL) tablet 650 mg  650 mg Oral Q6H PRN [name], John T, MD      . alum & mag hydroxide-simeth (MAALOX/MYLANTA) 200-200-20 MG/5ML suspension 30 mL  30 mL Oral Q4H PRN [name], John T, MD      . cholecalciferol (VITAMIN D) tablet 1,000 Units  1,000 Units Oral Daily [name], [name], MD   1,000 Units at 09/30/16 0820  . cloZAPine (CLOZARIL) tablet 200 mg  200 mg Oral Daily Hildred Priest, MD      . cloZAPine (CLOZARIL) tablet 300 mg  300 mg Oral QHS Hildred Priest, MD      . desmopressin (DDAVP) tablet 0.2 mg  0.2 mg Oral QHS [name], John T, MD   0.2 mg at 09/29/16 2156  . divalproex (DEPAKOTE) DR tablet 500 mg  500 mg Oral Q12H Hildred Priest, MD      . docusate sodium (COLACE) capsule 100 mg  100 mg Oral BID Hildred Priest, MD      . LORazepam (ATIVAN) tablet 2 mg  2 mg Oral Q4H PRN Hildred Priest, MD      . magnesium hydroxide (MILK OF MAGNESIA) suspension 30 mL  30 mL Oral Daily PRN [name], John T, MD      . metoprolol tartrate (LOPRESSOR) tablet 12.5 mg  12.5 mg Oral BID Hildred Priest, MD      . OLANZapine (ZYPREXA) injection 10 mg  10 mg Intramuscular BID PRN Hildred Priest, MD       Or  . OLANZapine zydis (ZYPREXA) disintegrating tablet 10 mg  10 mg Oral BID PRN Hildred Priest, MD      . polyethylene glycol (MIRALAX / GLYCOLAX) packet 17 g  17 g Oral Daily Hildred Priest, MD       PTA Medications: Prescriptions Prior to Admission  Medication Sig Dispense  Refill Last Dose  . benztropine (COGENTIN) 2 MG tablet Take 2 mg by mouth 2 (two) times daily.   09/17/2016 at 0800  . bismuth subsalicylate (PEPTO BISMOL) 262 MG/15ML suspension Take 30 mLs by mouth every 6 (six) hours as needed.   prn at prn  . Cholecalciferol (VITAMIN D-3) 5000 units TABS Take 1 tablet by mouth daily.   09/17/2016 at 0800  . cloZAPine (CLOZARIL) 100 MG tablet Take 400-450 mg by mouth See admin instructions. tk 450mg  in the morning and 400 mg at bedtime   09/17/2016 at 0800  . desmopressin (DDAVP) 0.2 MG tablet Take 0.2 mg by mouth at bedtime.   09/16/2016 at 2000  . escitalopram (LEXAPRO) 10 MG tablet Take 10 mg by mouth daily.   09/17/2016 at 0800  . hydrOXYzine (ATARAX/VISTARIL) 25 MG tablet Take 25 mg by mouth at bedtime.   09/16/2016 at 2000  . LORazepam (ATIVAN) 0.5 MG tablet Take 1 tablet (0.5 mg total) by mouth 2 (two) times daily as needed for anxiety. 20 tablet 0   . Melatonin 5 MG TABS Take 5 mg by mouth at bedtime as needed.   prn at prn  . OLANZapine (ZYPREXA) 10 MG tablet Take 10 mg by mouth daily as needed.   prn at prn    Patient Stressors: Other: Unsure  of things  Patient Strengths: Active sense of humor Supportive family/friends  Treatment Modalities: Medication Management, Group therapy, Case management,  1 to 1 session with clinician, Psychoeducation, Recreational therapy.   Physician Treatment Plan for Primary Diagnosis: Schizophrenia ([place]) Long Term Goal(s): Improvement in symptoms so as ready for discharge Improvement in symptoms so as ready for discharge   Short Term Goals: Ability to demonstrate self-control will improve Ability to identify and develop effective coping behaviors will improve Ability to verbalize feelings will improve  Medication Management: Evaluate patient's response, side effects, and tolerance of medication regimen.  Therapeutic Interventions: 1 to 1 sessions, Unit Group sessions and Medication administration.  Evaluation of  Outcomes: Progressing  Physician Treatment Plan for Secondary Diagnosis: Principal Problem:   Schizophrenia ([place]) Active Problems:   Tobacco use disorder  Long Term Goal(s): Improvement in symptoms so as ready for discharge Improvement in symptoms so as ready for discharge   Short Term Goals: Ability to demonstrate self-control will improve Ability to identify and develop effective coping behaviors will improve Ability to verbalize feelings will improve     Medication Management: Evaluate patient's response, side effects, and tolerance of medication regimen.  Therapeutic Interventions: 1 to 1 sessions, Unit Group sessions and Medication administration.  Evaluation of Outcomes: Progressing   RN Treatment Plan for Primary Diagnosis: Schizophrenia ([place]) Long Term Goal(s): Knowledge of disease and therapeutic regimen to maintain health will improve  Short Term Goals: Ability to demonstrate self-control, Ability to identify and develop effective coping behaviors will improve and Compliance with prescribed medications will improve  Medication Management: RN will administer medications as ordered by provider, will assess and evaluate patient's response and provide education to patient for prescribed medication. RN will report any adverse and/or side effects to prescribing provider.  Therapeutic Interventions: 1 on 1 counseling sessions, Psychoeducation, Medication administration, Evaluate responses to treatment, Monitor vital signs and CBGs as ordered, Perform/monitor CIWA, COWS, AIMS and Fall Risk screenings as ordered, Perform wound care treatments as ordered.  Evaluation of Outcomes: Progressing   LCSW Treatment Plan for Primary Diagnosis: Schizophrenia ([place]) Long Term Goal(s): Safe transition to appropriate next level of care at discharge, Engage patient in therapeutic group addressing interpersonal concerns.  Short Term Goals: Engage patient in aftercare planning with referrals and  resources, Increase social support, Increase emotional regulation and Increase skills for wellness and recovery  Therapeutic Interventions: Assess for all discharge needs, 1 to 1 time with Social worker, Explore available resources and support systems, Assess for adequacy in community support network, Educate family and significant other(s) on suicide prevention, Complete Psychosocial Assessment, Interpersonal group therapy.  Evaluation of Outcomes: Progressing   Progress in Treatment: Attending groups: Yes. Participating in groups: Yes. Taking medication as prescribed: Yes. Toleration medication: Yes. Family/Significant other contact made: No, will contact:  CSW will contact pt's legal guardian  Patient understands diagnosis: Yes. Discussing patient identified problems/goals with staff: Yes. Medical problems stabilized or resolved: Yes. Denies suicidal/homicidal ideation: Yes. Issues/concerns per patient self-inventory: No.  New problem(s) identified: No, Describe:  None identified  New Short Term/Long Term Goal(s): No goal identified at this time.  Discharge Plan or Barriers: CSW assessing appropriate discharge plan.  Reason for Continuation of Hospitalization: Hallucinations Suicidal ideation  Estimated Length of Stay: 3-5 days   Attendees: Patient: Austin Gates 09/30/2016 1:32 PM  Physician: Dr. Merlyn Albert, MD  09/30/2016 1:32 PM  Nursing: Ardeen Fillers, BSN, RN  09/30/2016 1:32 PM  RN Care Manager: 09/30/2016 1:32 PM  Social Worker: Laury Axon  Fatima Sanger, MSW, LCSW-A 09/30/2016 1:32 PM  Recreational Therapist: Drue Flirt, LRT, CTRS  09/30/2016 1:32 PM  Other:  09/30/2016 1:32 PM  Other:  09/30/2016 1:32 PM  Other: 09/30/2016 1:32 PM    Scribe for Treatment Team: Emilie Rutter, LCSWA 09/30/2016 1:32 PM

## 2016-09-30 NOTE — Tx Team (Signed)
Initial Treatment Plan 09/30/2016 12:39 PM Sina Lucchesi KLT:[id number]    PATIENT STRESSORS: Other: Unsure of things   PATIENT STRENGTHS: Active sense of humor Supportive family/friends   PATIENT IDENTIFIED PROBLEMS:   Schizophrenia    Disorganized behavior    Intermittent urinary/bowel incontinence     Memory concerns       DISCHARGE CRITERIA:  Adequate post-discharge living arrangements Safe-care adequate arrangements made  PRELIMINARY DISCHARGE PLAN: Return to previous living arrangement  PATIENT/FAMILY INVOLVEMENT: This treatment plan has been presented to and reviewed with the patient, Austin Gates.  The patient and family have been given the opportunity to ask questions and make suggestions.  Kendrick Fries, RN 09/30/2016, 12:39 PM

## 2016-09-30 NOTE — Progress Notes (Signed)
Pt guardian brought letter of guardianship paperwork and signed consents. Documents placed in chart

## 2016-09-30 NOTE — Progress Notes (Signed)
Initial Nutrition Assessment  DOCUMENTATION CODES:   Underweight  INTERVENTION:   Ensure Enlive po BID, each supplement provides 350 kcal and 20 grams of protein  NUTRITION DIAGNOSIS:   Underweight related to social / environmental circumstances (Schizophrenia) as evidenced by other (see comment) (pt only 79% IBW).  GOAL:   Patient will meet greater than or equal to 90% of their needs  MONITOR:   PO intake, Supplement acceptance, Weight trends  REASON FOR ASSESSMENT:   Malnutrition Screening Tool    ASSESSMENT:   56 y/o male with h/o DM, HTN, and Schizophrenia brought to the ED due to self-injurious behavior at his group home including urinating and defecating on himself without going to the bathroom and banging his head on the wall.   Pt eating meals. Per chart pt is weight stable. RD will order supplements as pt is underweight.   Medications reviewed and include: Vit D  Labs reviewed: BUN 23(H), creat 1.31(H)  Diet Order:  Diet regular Room service appropriate? Yes; Fluid consistency: Thin  Skin:  Reviewed, no issues  Last BM:  8/9  Height:   Ht Readings from Last 1 Encounters:  09/29/16 6\' 1"  (1.854 m)    Weight:   Wt Readings from Last 1 Encounters:  09/29/16 146 lb (66.2 kg)    Ideal Body Weight:  80.9 kg  BMI:  Body mass index is 19.26 kg/m.  Estimated Nutritional Needs:   Kcal:  2000-2300kcal/day   Protein:  86-99g/day   Fluid:  >2L/day   EDUCATION NEEDS:   Education needs no appropriate at this time  Koleen Distance MS, RD, [place] Pager #[phone] After Hours Pager: [phone]

## 2016-09-30 NOTE — Plan of Care (Signed)
Problem: Coping: Goal: Ability to demonstrate self-control will improve Pt will maintain appropriate behavior toward staff and peers while hospitalized Outcome: Progressing Patient maintains a positive attitude, easy to redirect

## 2016-09-30 NOTE — Progress Notes (Signed)
Recreation Therapy Notes  Date: 08.10.18 Time: 9:30 am Location: Craft Room  Group Topic: Coping Skills  Goal Area(s) Addresses:  Patient will verbalize benefit of using art as a coping skill. Patient will verbalize one emotion experienced in group.  Behavioral Response: Attentive, Left early  Intervention: Coloring  Activity: Patients were given coloring sheets to color and were instructed to think about the emotions they were feeling as well as what their minds were focused on.  Education: LRT educated patients on healthy coping skills.  Education Outcome: Patient left before LRT educated group.  Clinical Observations/Feedback: Patient colored coloring sheet. Patient talkative and LRT had to redirect patient multiple times. Patient complied. Patient left group at approximately 4:43 pm to use the restroom. Patient did not return to group.  Leonette Monarch, LRT/CTRS 09/30/2016 10:09 AM

## 2016-09-30 NOTE — Plan of Care (Signed)
Problem: Health Behavior/Discharge Planning: Goal: Compliance with prescribed medication regimen will improve Patient will remain compliant with medication regimen during hospitalization Outcome: Progressing Medication regime initiated. Patient taking medications as prescribed

## 2016-09-30 NOTE — H&P (Addendum)
Psychiatric Admission Assessment Adult  Patient Identification: Austin Gates MRN:  [id number] Date of Evaluation:  09/30/2016 Chief Complaint:  Schizophreia Principal Diagnosis: Schizophrenia ([place]) Diagnosis:   Patient Active Problem List   Diagnosis Date Noted  . Tobacco use disorder [F17.200] 09/30/2016  . 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 Schizophrenia ([place]) [F20.9] 09/29/2016   History of Present Illness:   56 year old man, with history of schizophrenia and moderate intellectual disability, under the guardianship of his sister, was brought to our ER on 8/9 by his caregiver with reports that he had been pounding his head against the wall.  Pt lives in a group home (Austin Gates-[phone]) and has been there for about 2 years.   Patient was cooperative with the interview but was disorganized and difficult to follow. He insisted repeatedly that he was a good person and that he likes to play basketball. He admitted freely however that he had been punching his fist into the wall putting holes in the drywall and also banging his head against the wall. Patient tells me he was hearing voices and having suicidal thoughts prior to admission but he is unable to elaborate. Says that he is slept very well last night he is not longer hearing voices or having suicidal thoughts. His answers to questions were really not consistent or completely unrelated to the questions.    Approximately a month ago, the patient started "staging fake falls. He'll wait to you get close to someone or make eye contact and hold on to something, then fall on the floor and roll over." However, within the last two weeks, he started using the bathroom on his self. In the middle of the night, he would urinate "on his bed, not in it and in his closet." On 09/29/2016, he had three bowel movements on his self before 3:30pm. Group Home reports, the only change on they are aware of is his Neuro Psychiatrist,  Dr. Tamera Punt, reduced one of his medications. Since then had hasn't slept in two days. He seen his PCP and was prescribed something for sleep but is hasn't worked.  Trauma: unknown  Substance abuse: denies.  Smokes 1 pack per day  He has been on 800 mg of clozaril for more than 12 months.  Confirmed with [facility] and also with [facility].  Clozaril was increased to 400mg  q day and 450 mg qhs on 6/21.    On 7/1 here in ER for worsening paranoia  On 7/29 Brought in to hospital for syncope and hypotension ---likely secondary to increased dose of clozaril.    Associated Signs/Symptoms: Depression Symptoms:  denies (Hypo) Manic Symptoms:  denies Anxiety Symptoms:  denies Psychotic Symptoms:  denies PTSD Symptoms: NA Total Time spent with patient: 1 hour  Past Psychiatric History: f/u with Dr. Tamera Punt at [facility] [phone]. He has been on 800 mg of clozaril for more than 12 months.  Confirmed with [facility] and also with [facility].  Clozaril was increased to 400mg  q day and 450 mg qhs on 6/21.    On 7/29 Brought in to hospital for syncope and hypotension ---likely secondary to increased dose of clozaril.  On 7/1 here in ER for worsening paranoia  Is the patient at risk to self? Yes.    Has the patient been a risk to self in the past 6 months? No.  Has the patient been a risk to self within the distant past? No.  Is the patient a risk to others? No.  Has  the patient been a risk to others in the past 6 months? No.  Has the patient been a risk to others within the distant past? No.   Alcohol Screening: Patient refused Alcohol Screening Tool: Yes 1. How often do you have a drink containing alcohol?: Never 2. How many drinks containing alcohol do you have on a typical day when you are drinking?: 1 or 2 3. How often do you have six or more drinks on one occasion?: Never Preliminary Score: 0 4. How often during the last year have you found that you  were not able to stop drinking once you had started?: Never 5. How often during the last year have you failed to do what was normally expected from you becasue of drinking?: Never 6. How often during the last year have you needed a first drink in the morning to get yourself going after a heavy drinking session?: Never 7. How often during the last year have you had a feeling of guilt of remorse after drinking?: Never 8. How often during the last year have you been unable to remember what happened the night before because you had been drinking?: Never 9. Have you or someone else been injured as a result of your drinking?: No 10. Has a relative or friend or a doctor or another health worker been concerned about your drinking or suggested you cut down?: No Alcohol Use Disorder Identification Test Final Score (AUDIT): 0 Brief Intervention: AUDIT score less than 7 or less-screening does not suggest unhealthy drinking-brief intervention not indicated  Past Medical History:  Past Medical History:  Diagnosis Date  . Diabetes mellitus without complication ([place])   . Hypertension     Past Surgical History:  Procedure Laterality Date  . KNEE ARTHROSCOPY     Family History:  Family History  Problem Relation Age of Onset  . Diabetes Mother    Family Psychiatric  History: unknown  Tobacco Screening: Have you used any form of tobacco in the last 30 days? (Cigarettes, Smokeless Tobacco, Cigars, and/or Pipes): Yes Tobacco use, Select all that apply: 5 or more cigarettes per day Are you interested in Tobacco Cessation Medications?: No, patient refused Counseled patient on smoking cessation including recognizing danger situations, developing coping skills and basic information about quitting provided: Refused/Declined practical counseling   Social History:  History  Alcohol Use No     History  Drug Use No     Allergies:  No Known Allergies   Lab Results:  Results for orders placed or performed  during the hospital encounter of 09/29/16 (from the past 48 hour(s))  Hemoglobin A1c     Status: None   Collection Time: 09/30/16  7:00 AM  Result Value Ref Range   Hgb A1c MFr Bld 5.4 4.8 - 5.6 %    Comment: (NOTE) Pre diabetes:          5.7%-6.4% Diabetes:              >6.4% Glycemic control for   <7.0% adults with diabetes    Mean Plasma Glucose 108.28 mg/dL    Comment: Performed at [facility], [address] [place], [place] [address]  Lipid panel     Status: None   Collection Time: 09/30/16  7:00 AM  Result Value Ref Range   Cholesterol 135 0 - 200 mg/dL   Triglycerides 62 <150 mg/dL   HDL 60 >40 mg/dL   Total CHOL/HDL Ratio 2.3 RATIO   VLDL 12 0 - 40  mg/dL   LDL Cholesterol 63 0 - 99 mg/dL    Comment:        Total Cholesterol/HDL:CHD Risk Coronary Heart Disease Risk Table                     Men   Women  1/2 Average Risk   3.4   3.3  Average Risk       5.0   4.4  2 X Average Risk   9.6   7.1  3 X Average Risk  23.4   11.0        Use the calculated Patient Ratio above and the CHD Risk Table to determine the patient's CHD Risk.        ATP III CLASSIFICATION (LDL):  <100     mg/dL   Optimal  100-129  mg/dL   Near or Above                    Optimal  130-159  mg/dL   Borderline  160-189  mg/dL   High  >190     mg/dL   Very High   TSH     Status: Abnormal   Collection Time: 09/30/16  7:00 AM  Result Value Ref Range   TSH 0.047 (L) 0.350 - 4.500 uIU/mL    Comment: Performed by a 3rd Generation assay with a functional sensitivity of <=0.01 uIU/mL.  CBC with Differential/Platelet     Status: Abnormal   Collection Time: 09/30/16  1:12 PM  Result Value Ref Range   WBC 5.2 3.8 - 10.6 K/uL   RBC 4.45 4.40 - 5.90 MIL/uL   Hemoglobin 13.2 13.0 - 18.0 g/dL   HCT 39.6 (L) 40.0 - 52.0 %   MCV 89.1 80.0 - 100.0 fL   MCH 29.6 26.0 - 34.0 pg   MCHC 33.3 32.0 - 36.0 g/dL   RDW 13.6 11.5 - 14.5 %   Platelets 222 150 - 440 K/uL   Neutrophils Relative % 73 %    Neutro Abs 3.8 1.4 - 6.5 K/uL   Lymphocytes Relative 17 %   Lymphs Abs 0.9 (L) 1.0 - 3.6 K/uL   Monocytes Relative 6 %   Monocytes Absolute 0.3 0.2 - 1.0 K/uL   Eosinophils Relative 3 %   Eosinophils Absolute 0.2 0 - 0.7 K/uL   Basophils Relative 1 %   Basophils Absolute 0.1 0 - 0.1 K/uL    Blood Alcohol level:  Lab Results  Component Value Date   ETH <5 09/29/2016   ETH <5 70/35/0093    Metabolic Disorder Labs:  Lab Results  Component Value Date   HGBA1C 5.4 09/30/2016   MPG 108.28 09/30/2016   MPG 114 09/17/2016   No results found for: PROLACTIN Lab Results  Component Value Date   CHOL 135 09/30/2016   TRIG 62 09/30/2016   HDL 60 09/30/2016   CHOLHDL 2.3 09/30/2016   VLDL 12 09/30/2016   LDLCALC 63 09/30/2016    Current Medications: Current Facility-Administered Medications  Medication Dose Route Frequency Provider Last Rate Last Dose  . acetaminophen (TYLENOL) tablet 650 mg  650 mg Oral Q6H PRN [name], John T, MD      . alum & mag hydroxide-simeth (MAALOX/MYLANTA) 200-200-20 MG/5ML suspension 30 mL  30 mL Oral Q4H PRN [name], John T, MD      . cholecalciferol (VITAMIN D) tablet 1,000 Units  1,000 Units Oral Daily [name], [name], MD   1,000 Units at 09/30/16  0820  . cloZAPine (CLOZARIL) tablet 200 mg  200 mg Oral Daily Hildred Priest, MD   200 mg at 09/30/16 1418  . cloZAPine (CLOZARIL) tablet 400 mg  400 mg Oral QHS Hildred Priest, MD      . desmopressin (DDAVP) tablet 0.2 mg  0.2 mg Oral QHS [name], John T, MD   0.2 mg at 09/29/16 2156  . divalproex (DEPAKOTE) DR tablet 500 mg  500 mg Oral Q12H Hildred Priest, MD   500 mg at 09/30/16 1418  . docusate sodium (COLACE) capsule 100 mg  100 mg Oral BID Hildred Priest, MD      . LORazepam (ATIVAN) tablet 2 mg  2 mg Oral Q4H PRN Hildred Priest, MD      . magnesium hydroxide (MILK OF MAGNESIA) suspension 30 mL  30 mL Oral Daily PRN [name], John T, MD      .  metoprolol tartrate (LOPRESSOR) tablet 12.5 mg  12.5 mg Oral BID Hildred Priest, MD      . OLANZapine (ZYPREXA) injection 10 mg  10 mg Intramuscular BID PRN Hildred Priest, MD       Or  . OLANZapine zydis (ZYPREXA) disintegrating tablet 10 mg  10 mg Oral BID PRN Hildred Priest, MD      . polyethylene glycol (MIRALAX / GLYCOLAX) packet 17 g  17 g Oral Daily Hildred Priest, MD       PTA Medications: Prescriptions Prior to Admission  Medication Sig Dispense Refill Last Dose  . benztropine (COGENTIN) 2 MG tablet Take 2 mg by mouth 2 (two) times daily.   09/17/2016 at 0800  . bismuth subsalicylate (PEPTO BISMOL) 262 MG/15ML suspension Take 30 mLs by mouth every 6 (six) hours as needed.   prn at prn  . Cholecalciferol (VITAMIN D-3) 5000 units TABS Take 1 tablet by mouth daily.   09/17/2016 at 0800  . cloZAPine (CLOZARIL) 100 MG tablet Take 400-450 mg by mouth See admin instructions. tk 450mg  in the morning and 400 mg at bedtime   09/17/2016 at 0800  . desmopressin (DDAVP) 0.2 MG tablet Take 0.2 mg by mouth at bedtime.   09/16/2016 at 2000  . escitalopram (LEXAPRO) 10 MG tablet Take 10 mg by mouth daily.   09/17/2016 at 0800  . hydrOXYzine (ATARAX/VISTARIL) 25 MG tablet Take 25 mg by mouth at bedtime.   09/16/2016 at 2000  . LORazepam (ATIVAN) 0.5 MG tablet Take 1 tablet (0.5 mg total) by mouth 2 (two) times daily as needed for anxiety. 20 tablet 0   . Melatonin 5 MG TABS Take 5 mg by mouth at bedtime as needed.   prn at prn  . OLANZapine (ZYPREXA) 10 MG tablet Take 10 mg by mouth daily as needed.   prn at prn    Musculoskeletal: Strength & Muscle Tone: within normal limits Gait & Station: normal Patient leans: N/A  Psychiatric Specialty Exam: Physical Exam  Constitutional: He is oriented to person, place, and time. He appears well-developed and well-nourished.  HENT:  Head: Normocephalic and atraumatic.  Eyes: EOM are normal.  Neck: Normal range of  motion.  Respiratory: Effort normal.  Musculoskeletal: Normal range of motion.  Neurological: He is alert and oriented to person, place, and time.    Review of Systems  Constitutional: Negative.   HENT: Negative.   Eyes: Negative.   Respiratory: Negative.   Cardiovascular: Negative.   Gastrointestinal: Negative.   Genitourinary: Negative.   Musculoskeletal: Negative.   Skin: Negative.   Neurological: Negative.  Endo/Heme/Allergies: Negative.   Psychiatric/Behavioral: Negative.     Blood pressure (!) 137/104, pulse (!) 105, temperature 97.8 F (36.6 C), temperature source Oral, resp. rate 18, height 6\' 1"  (1.854 m), weight 66.2 kg (146 lb), SpO2 98 %.Body mass index is 19.26 kg/m.  General Appearance: Fairly Groomed  Eye Contact:  Good  Speech:  Garbled  Volume:  Normal  Mood:  Euthymic  Affect:  Appropriate and Congruent  Thought Process:  Disorganized and Descriptions of Associations: Loose  Orientation:  Full (Time, Place, and Person)  Thought Content:  Illogical and Tangential  Suicidal Thoughts:  No  Homicidal Thoughts:  No  Memory:  Immediate;   Poor Recent;   Poor Remote;   Poor  Judgement:  Impaired  Insight:  Lacking  Psychomotor Activity:  Normal  Concentration:  Concentration: Poor and Attention Span: Poor  Recall:  Poor  Fund of Knowledge:  Poor  Language:  Fair  Akathisia:  No  Handed:    AIMS (if indicated):     Assets:  Armed forces logistics/support/administrative officer Social Support  ADL's:  Intact  Cognition:  Impaired,  Moderate  Sleep:  Number of Hours: 5.15    Treatment Plan Summary:  Pt with schizophrenia maintained on high dose clozaril for >12 months. Despite compliance pt started to decompensate about 2 months ago.  Dose of clozaril was increased by outpt psychiatrist w/o benefit. June 21/18  Pt has been in our ER 3 times since then:  7/1 worsening paranoia (seen by [place] 05 mg bid recommended)  7/29 syncope and hypotension (admitted to hospitalist  service)  8/9 incontinence, disorganization and hallucinations.  Spoke with [facility] and pharmacy. Clozaril info/dose has been confirmed.  No allergies in his file per pharmacist.  Per home meds he is on multiple anticholinergics: vistaril qhs, clozaril and cogentin 2 mg bid-----potentially he could be having episodes of confusion.  Incontinence could be due to urinary retention.  Recent syncope/falls could be the result of clozaril induced orthostatic hypotension.  Plan:  Schizophrenia: will restart clozaril at a lower dose for now. 200 mg q am and 400 mg po qhs (prior dose 400/450mg ).  Clozaril level was drawn this am.  Moderate MR: lives in a [facility], sister is guardian.  Seizure: risk of seizures due to high dose clozaril use--will start depakote 500 mg po bid  Clozaril induced tachycardia: will start metoprolol 12.5 mg po bid  Clozaril induced constipation: will start miralax daily and colace bid  Enuresis: continue desmopressin 0.2 mg qhs  Agitation: has orders for zyprexa 10 mg bid PO/IM prn.  Also has orders for ativan 2 mg q 4 h prn  Tobacco use: will order nicotine patch  Vit D deficiency: continue 1000 U q day  Pharmacy consult for clozaril monitoring  Labs: HbA1c 5.4.  TSH is low will need to recheck prior to discharge.  EKG: QTC 456  Head CT 7/28 wnl  Collateral info needed from guardian and psychiatrist.  F/u:  [facility]  Dispo: back to [facility] once stable    Physician Treatment Plan for Primary Diagnosis: Schizophrenia ([place]) Long Term Goal(s): Improvement in symptoms so as ready for discharge  Short Term Goals: Ability to demonstrate self-control will improve and Ability to identify and develop effective coping behaviors will improve  Physician Treatment Plan for Secondary Diagnosis: Principal Problem:   Schizophrenia ([place]) Active Problems:   Tobacco use disorder   Moderate intellectual disability  Long Term Goal(s): Improvement in symptoms so as  ready for discharge  Short Term Goals: Ability to verbalize feelings will improve  I certify that inpatient services furnished can reasonably be expected to improve the patient's condition.    Hildred Priest, MD 8/10/20183:18 PM

## 2016-09-30 NOTE — Plan of Care (Signed)
Problem: Self-Care: Goal: Ability to participate in self-care as condition permits will improve Outcome: Not Progressing Newly admitted. Pt was educated about self care

## 2016-09-30 NOTE — Plan of Care (Signed)
Problem: Coping: Goal: Ability to verbalize frustrations and anger appropriately will improve Patient will be able to communicate his thoughts and concern appropriately by day 2 of admission Outcome: Progressing Pt is redirected as needed. Able to express his thoughts and feelings ans needed

## 2016-09-30 NOTE — Progress Notes (Signed)
Pt requires some redirection seen wandering halls into other pts rooms. Nurse manager notified. No aggressive or hostile behaviors noted.

## 2016-09-30 NOTE — Plan of Care (Signed)
Problem: Role Relationship: Goal: Ability to interact with others will improve Pt will be able to hold appropriate conversations with staff and peers by day 2 of admission Outcome: Not Progressing Pt disorganized with poor insight

## 2016-09-30 NOTE — BHH Counselor (Signed)
Adult Comprehensive Assessment  Patient ID: Austin Gates, male   DOB: 07-Sep-1960, 56 y.o.   MRN: [id number]  Information Source: Information source:  (pt guardian)  Current Stressors:  Social relationships:  (behavior has been deteriorating over past 2 weeks, there have been several staff changes)  Living/Environment/Situation:  Living Arrangements: Group Home Living conditions (as described by patient or guardian): good environment, per guardian How long has patient lived in current situation?: almost 2 years What is atmosphere in current home: Supportive  Family History:  Marital status: Single Are you sexually active?: No Does patient have children?: No  Childhood History:  By whom was/is the patient raised?: Mother Additional childhood history information: father was not around, problems began in middle school.  [facility]   Description of patient's relationship with caregiver when they were a child: good relationship with mother but behaviors became unmanagable. Patient's description of current relationship with people who raised him/her: both parents deceased How were you disciplined when you got in trouble as a child/adolescent?: appropriate discipline Does patient have siblings?: Yes Number of Siblings: 5 Description of patient's current relationship with siblings: 3 brothers, 2 sisters.  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  Alcohol/Substance Abuse:   What has been your use of drugs/alcohol within the last 12 months?: none reported If attempted suicide, did drugs/alcohol play a role in this?: No Alcohol/Substance Abuse Treatment Hx: Denies past history Has alcohol/substance abuse ever caused legal problems?: No  Social Support System:   Patient's Community Support System: Good Describe Community Support System: sister/guardian, other siblings, family Type of faith/religion: [employer] How does patient's faith help to cope with current illness?: pt enjoys attending  Leisure/Recreation:   Leisure and Hobbies: listen to music, play basketball, bowling, swimming  Strengths/Needs:   What things does the patient do well?: group home is a good fit for him In what areas does patient struggle / problems for patient: current behaviors--they are not typical  Discharge Plan:   Does patient have access to transportation?: Yes Will patient be returning to same living situation after discharge?: Yes (pt can return to group home, per guardian) Currently receiving community mental health services: Yes (From Whom) Does patient have financial barriers related to discharge medications?: No  Summary/Recommendations:   Summary and Recommendations (to be completed  by the evaluator): Pt is 56 year old male from [place] group home.  Pt is diagnosed with schizophrenia and mild IDD.  Pt admitted due to self injurious behaviors in his group home placement.  Recommendations for pt include crisis stabliziation, therapeutic milieu, attend and participate in groups, medicaiton management, and development of comprhensive mental wellness plan.  [name]. 09/30/2016

## 2016-09-30 NOTE — Progress Notes (Signed)
Recreation Therapy Notes  According to notes, patient is disorganized and difficult to follow. LRT will not assess patient at this time.  Leonette Monarch, LRT/CTRS 09/30/2016 4:30 PM

## 2016-10-01 DIAGNOSIS — F201 Disorganized schizophrenia: Secondary | ICD-10-CM

## 2016-10-01 DIAGNOSIS — R55 Syncope and collapse: Secondary | ICD-10-CM

## 2016-10-01 DIAGNOSIS — E559 Vitamin D deficiency, unspecified: Secondary | ICD-10-CM

## 2016-10-01 DIAGNOSIS — F17210 Nicotine dependence, cigarettes, uncomplicated: Secondary | ICD-10-CM

## 2016-10-01 DIAGNOSIS — R451 Restlessness and agitation: Secondary | ICD-10-CM

## 2016-10-01 DIAGNOSIS — I959 Hypotension, unspecified: Secondary | ICD-10-CM

## 2016-10-01 DIAGNOSIS — R443 Hallucinations, unspecified: Secondary | ICD-10-CM

## 2016-10-01 DIAGNOSIS — F71 Moderate intellectual disabilities: Secondary | ICD-10-CM

## 2016-10-01 DIAGNOSIS — F458 Other somatoform disorders: Secondary | ICD-10-CM

## 2016-10-01 NOTE — BHH Group Notes (Signed)
[place] Group Notes:  (Nursing/MHT/Case Management/Adjunct)  Date:  10/01/2016  Time:  3:31 AM  Type of Therapy:  Psychoeducational Skills  Participation Level:  Active  Participation Quality:  Appropriate and Attentive  Affect:  Appropriate  Cognitive:  Appropriate  Insight:  Appropriate  Engagement in Group:  Engaged  Modes of Intervention:  Discussion, Socialization and Support  Summary of Progress/Problems:  Austin Gates 10/01/2016, 3:31 AM

## 2016-10-01 NOTE — Progress Notes (Signed)
[facility] MD Progress Note  10/01/2016 9:59 AM Austin Gates  MRN:  [id number] Subjective: 56 year old man, with history of schizophrenia and moderate intellectual disability, under the guardianship of his sister, was brought to our ER on 8/9 by his caregiver with reports that he had been pounding his head against the wall.  Patient seen ambulating about the unit and he was seen this morning. Chart was reviewed electronically. Patient pleasant and cooperative. Speech is garbled and difficult to understand.  He is tangential in his speech and thinking. Cognition is impaired. Denies suicidal thoughts. Principal Problem: Schizophrenia ([place]) Diagnosis:   Patient Active Problem List   Diagnosis Date Noted  . Tobacco use disorder [F17.200] 09/30/2016  . 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 Schizophrenia ([place]) [F20.9] 09/29/2016   Total Time spent with patient: 30 minutes  Past Psychiatric History:f/u with Dr. Tamera Punt at [facility] [phone]. He has been on 800 mg of clozaril for more than 12 months.  Confirmed with [facility] and also with [facility].  Clozaril was increased to 400mg  q day and 450 mg qhs on 6/21.    On 7/1 here in ER for worsening paranoia  On 7/29 Brought in to hospital for syncope and hypotension ---likely secondary to increased dose of clozaril.  Past Medical History:  Past Medical History:  Diagnosis Date  . Diabetes mellitus without complication ([place])   . Hypertension     Past Surgical History:  Procedure Laterality Date  . KNEE ARTHROSCOPY     Family History:  Family History  Problem Relation Age of Onset  . Diabetes Mother    Family Psychiatric  History: Unknown Social History:  History  Alcohol Use No     History  Drug Use No    Social History   Social History  . Marital status: Single    Spouse name: N/A  . Number of children: N/A  . Years of education: N/A   Social History Main Topics  . Smoking status:  Current Every Day Smoker    Packs/day: 1.00    Types: Cigarettes  . Smokeless tobacco: Never Used  . Alcohol use No  . Drug use: No  . Sexual activity: No   Other Topics Concern  . None   Social History Narrative  . None   Additional Social History:                         Sleep: Fair  Appetite:  Fair  Current Medications: Current Facility-Administered Medications  Medication Dose Route Frequency Provider Last Rate Last Dose  . acetaminophen (TYLENOL) tablet 650 mg  650 mg Oral Q6H PRN [name], John T, MD      . alum & mag hydroxide-simeth (MAALOX/MYLANTA) 200-200-20 MG/5ML suspension 30 mL  30 mL Oral Q4H PRN [name], John T, MD      . cholecalciferol (VITAMIN D) tablet 1,000 Units  1,000 Units Oral Daily [name], [name], MD   1,000 Units at 10/01/16 0858  . cloZAPine (CLOZARIL) tablet 200 mg  200 mg Oral Daily Hildred Priest, MD   200 mg at 10/01/16 0857  . cloZAPine (CLOZARIL) tablet 400 mg  400 mg Oral QHS Hildred Priest, MD   400 mg at 09/30/16 2142  . desmopressin (DDAVP) tablet 0.2 mg  0.2 mg Oral QHS [name], John T, MD   0.2 mg at 09/30/16 2142  . divalproex (DEPAKOTE) DR tablet 500 mg  500 mg Oral Q12H Hildred Priest, MD  500 mg at 10/01/16 0858  . docusate sodium (COLACE) capsule 100 mg  100 mg Oral BID Hildred Priest, MD   100 mg at 10/01/16 0857  . LORazepam (ATIVAN) tablet 2 mg  2 mg Oral Q4H PRN Hildred Priest, MD      . magnesium hydroxide (MILK OF MAGNESIA) suspension 30 mL  30 mL Oral Daily PRN [name], John T, MD      . metoprolol tartrate (LOPRESSOR) tablet 12.5 mg  12.5 mg Oral BID Hildred Priest, MD   12.5 mg at 10/01/16 0858  . OLANZapine (ZYPREXA) injection 10 mg  10 mg Intramuscular BID PRN Hildred Priest, MD       Or  . OLANZapine zydis (ZYPREXA) disintegrating tablet 10 mg  10 mg Oral BID PRN Hildred Priest, MD      . polyethylene glycol (MIRALAX  / GLYCOLAX) packet 17 g  17 g Oral Daily Hildred Priest, MD        Lab Results:  Results for orders placed or performed during the hospital encounter of 09/29/16 (from the past 48 hour(s))  Hemoglobin A1c     Status: None   Collection Time: 09/30/16  7:00 AM  Result Value Ref Range   Hgb A1c MFr Bld 5.4 4.8 - 5.6 %    Comment: (NOTE) Pre diabetes:          5.7%-6.4% Diabetes:              >6.4% Glycemic control for   <7.0% adults with diabetes    Mean Plasma Glucose 108.28 mg/dL    Comment: Performed at [facility], [place] [address] [place], [place] [address]  Lipid panel     Status: None   Collection Time: 09/30/16  7:00 AM  Result Value Ref Range   Cholesterol 135 0 - 200 mg/dL   Triglycerides 62 <150 mg/dL   HDL 60 >40 mg/dL   Total CHOL/HDL Ratio 2.3 RATIO   VLDL 12 0 - 40 mg/dL   LDL Cholesterol 63 0 - 99 mg/dL    Comment:        Total Cholesterol/HDL:CHD Risk Coronary Heart Disease Risk Table                     Men   Women  1/2 Average Risk   3.4   3.3  Average Risk       5.0   4.4  2 X Average Risk   9.6   7.1  3 X Average Risk  23.4   11.0        Use the calculated Patient Ratio above and the CHD Risk Table to determine the patient's CHD Risk.        ATP III CLASSIFICATION (LDL):  <100     mg/dL   Optimal  100-129  mg/dL   Near or Above                    Optimal  130-159  mg/dL   Borderline  160-189  mg/dL   High  >190     mg/dL   Very High   TSH     Status: Abnormal   Collection Time: 09/30/16  7:00 AM  Result Value Ref Range   TSH 0.047 (L) 0.350 - 4.500 uIU/mL    Comment: Performed by a 3rd Generation assay with a functional sensitivity of <=0.01 uIU/mL.  CBC with Differential/Platelet     Status: Abnormal   Collection Time: 09/30/16  1:12 PM  Result Value Ref Range   WBC 5.2 3.8 - 10.6 K/uL   RBC 4.45 4.40 - 5.90 MIL/uL   Hemoglobin 13.2 13.0 - 18.0 g/dL   HCT 39.6 (L) 40.0 - 52.0 %   MCV 89.1 80.0 - 100.0 fL   MCH 29.6 26.0  - 34.0 pg   MCHC 33.3 32.0 - 36.0 g/dL   RDW 13.6 11.5 - 14.5 %   Platelets 222 150 - 440 K/uL   Neutrophils Relative % 73 %   Neutro Abs 3.8 1.4 - 6.5 K/uL   Lymphocytes Relative 17 %   Lymphs Abs 0.9 (L) 1.0 - 3.6 K/uL   Monocytes Relative 6 %   Monocytes Absolute 0.3 0.2 - 1.0 K/uL   Eosinophils Relative 3 %   Eosinophils Absolute 0.2 0 - 0.7 K/uL   Basophils Relative 1 %   Basophils Absolute 0.1 0 - 0.1 K/uL    Blood Alcohol level:  Lab Results  Component Value Date   ETH <5 09/29/2016   ETH <5 32/99/2426    Metabolic Disorder Labs: Lab Results  Component Value Date   HGBA1C 5.4 09/30/2016   MPG 108.28 09/30/2016   MPG 114 09/17/2016   No results found for: PROLACTIN Lab Results  Component Value Date   CHOL 135 09/30/2016   TRIG 62 09/30/2016   HDL 60 09/30/2016   CHOLHDL 2.3 09/30/2016   VLDL 12 09/30/2016   LDLCALC 63 09/30/2016    Physical Findings: AIMS:  , ,  ,  ,    CIWA:    COWS:     Musculoskeletal: Strength & Muscle Tone: within normal limits Gait & Station: normal Patient leans: N/A  Psychiatric Specialty Exam: Physical Exam  ROS  Blood pressure (!) 160/108, pulse (!) 105, temperature 97.7 F (36.5 C), temperature source Oral, resp. rate 20, height 6\' 1"  (1.854 m), weight 146 lb (66.2 kg), SpO2 98 %.Body mass index is 19.26 kg/m.  General Appearance: Casual  Eye Contact:  Fair  Speech:  Garbled  Volume:  Normal  Mood:  Euthymic  Affect:  Congruent  Thought Process:  Disorganized  Orientation:  Full (Time, Place, and Person)  Thought Content:  Tangential  Suicidal Thoughts:  No  Homicidal Thoughts:  No  Memory:  Immediate;   Poor Recent;   Poor Remote;   Poor  Judgement:  Impaired  Insight:  Lacking  Psychomotor Activity:  Normal  Concentration:  Concentration: Fair and Attention Span: Poor  Recall:  Poor  Fund of Knowledge:  Poor  Language:  Fair  Akathisia:  No  Handed:  Right  AIMS (if indicated):     Assets:   Communication Skills  ADL's:  Intact  Cognition:  Impaired,  Moderate  Sleep:  Number of Hours: 6.3     Treatment Plan Summary: Daily contact with patient to assess and evaluate symptoms and progress in treatment and Medication management   Pt with schizophrenia maintained on high dose clozaril for >12 months. Despite compliance pt started to decompensate about 2 months ago.  Dose of clozaril was increased by outpt psychiatrist w/o benefit. June 21/18  Pt has been in our ER 3 times since then:  7/1 worsening paranoia (seen by [place] 05 mg bid recommended)  7/29 syncope and hypotension (admitted to hospitalist service)  8/9 incontinence, disorganization and hallucinations.  Spoke with [facility] and pharmacy. Clozaril info/dose has been confirmed.  No allergies in his file per pharmacist.  Per home meds he is  on multiple anticholinergics: vistaril qhs, clozaril and cogentin 2 mg bid-----potentially he could be having episodes of confusion.  Incontinence could be due to urinary retention.  Recent syncope/falls could be the result of clozaril induced orthostatic hypotension.  Plan:  Schizophrenia: will restart clozaril at a lower dose for now. 200 mg q am and 400 mg po qhs (prior dose 400/450mg ).  Clozaril level was drawn this am.  Moderate MR: lives in a [facility], sister is guardian.  Seizure: risk of seizures due to high dose clozaril use--will start depakote 500 mg po bid  Clozaril induced tachycardia: will start metoprolol 12.5 mg po bid  Clozaril induced constipation: will start miralax daily and colace bid  Enuresis: continue desmopressin 0.2 mg qhs  Agitation: has orders for zyprexa 10 mg bid PO/IM prn.  Also has orders for ativan 2 mg q 4 h prn  Tobacco use: will order nicotine patch  Vit D deficiency: continue 1000 U q day  Pharmacy consult for clozaril monitoring  Labs: HbA1c 5.4.  TSH is low will need to recheck prior to discharge.  EKG: QTC  456  Head CT 7/28 wnl  Collateral info needed from guardian and psychiatrist.  F/u:  [facility]  Dispo: back to [facility] once stable   Babita Amaker, MD 10/01/2016, 9:59 AM

## 2016-10-01 NOTE — BHH Group Notes (Signed)
[place] LCSW Group Therapy  10/01/2016 2:31 PM  Type of Therapy:  Group Therapy  Participation Level:  Active  Participation Quality:  Attentive  Affect:  Appropriate and Blunted  Cognitive:  Lacking  Insight:  None  Engagement in Therapy:  Distracting, Off Topic and Poor  Modes of Intervention:  Discussion, Education, Exploration, Socialization and Support  Summary of Progress/Problems: Leisure Education: Patients in this group will discuss activities that they can utilize for coping skills and relaxation. Participants will share ideas and activities with other group members that they use for leisure and self-care. Group facilitator will discuss the importance of incorporating leisure activities to improve mental wellness. Patient needed redirection for boundaries. Patient attempted to tuck his arm around CSW's arm, patient was instantly redirected by security and CSW. Patient then became very apologetic saying "I didn't mean to do that miss social worker".   Rhealyn Cullen G. Edmondson, [place] 10/01/2016, 2:38 PM

## 2016-10-01 NOTE — Plan of Care (Signed)
Problem: Activity: Goal: Interest or engagement in activities will improve Patient will be able to participate in unit activities by day 2 of admission  Outcome: Progressing Patient was observed walking with staff who were making rounds and asking if he could help.  He spent time in the dayroom and followed direction to participate.  Problem: Self-Care: Goal: Ability to participate in self-care as condition permits will improve Outcome: Not Progressing Patient was observed during preparation for bedtime.  He reclined on top of the covers and left the lights on.  Writer provided assistance with placement of a adult diaper.  Patient was dependent but cooperative.  He got under the covers with direction.

## 2016-10-01 NOTE — BHH Group Notes (Signed)
[place] Group Notes:  (Nursing/MHT/Case Management/Adjunct)  Date:  10/01/2016  Time:  9:21 PM  Type of Therapy:  Evening Wrap-up Group  Participation Level:  Active  Participation Quality:  Appropriate and Attentive  Affect:  Not Congruent  Cognitive:  Disorganized  Insight:  Improving  Engagement in Group:  Engaged  Modes of Intervention:  Discussion  Summary of Progress/Problems:  Levonne Spiller 10/01/2016, 9:21 PM

## 2016-10-01 NOTE — Progress Notes (Signed)
Pt continues to need frequent redirection from staff. Pt very disorganized but compliant with all medications. Seen pacing around unit. Pt has not shown any hostile or self injurious behaviors on unit. He is compliant and redirectable. Will continue to monitor.

## 2016-10-02 NOTE — Progress Notes (Signed)
Patient appears to have moments of disorientation and has to be redirected to which area he needs to be. Later today around 1310 patient was crawling on floor for a few seconds then got up and feel towards wall and then hit floor. Patient reports he hit his head. No apparent injury noted. Patient was placed on a 1:1 sitter for safety. Patient denies any SI/HI/AH/VH. Patient had a visit with his sister. Sister pointed out an area on his back and it appears to be a healed hole, but slightly inflamed around the puncture site. Patient state it hurts a little. Patient is alert and oriented x 4, breathing unlabored, and extremities x 4 within normal limits.  Patient did not display any disruptive behavior.  Will continue to monitor patient and notify MD of any changes.

## 2016-10-02 NOTE — Progress Notes (Signed)
[facility] MD Progress Note  10/02/2016 1:28 PM Austin Gates  MRN:  [id number] Subjective: 56 year old man, with history of schizophrenia and moderate intellectual disability, under the guardianship of his sister, was brought to our ER on 8/9 by his caregiver with reports that he had been pounding his head against the wall.  Patient seen sitting in the day room. He is not responsive to questions today. This clinician got a call at 3:30 AM saying that patient was taking a shower and had a fall but he was prevented from having the fall by staff. Again later this morning patient appeared to have a fall, but staff reports on the cc  cameras he did not actually hit his head. Patient appears to be faking several falls for an unknown reason. We will restrict him to his room for now. He is otherwise cooperative on the unit. Vital sign stable.  Principal Problem: Schizophrenia ([place]) Diagnosis:   Patient Active Problem List   Diagnosis Date Noted  . Tobacco use disorder [F17.200] 09/30/2016  . 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 Schizophrenia ([place]) [F20.9] 09/29/2016   Total Time spent with patient: 30 minutes  Past Psychiatric History:f/u with Dr. Tamera Punt at [facility] [phone]. He has been on 800 mg of clozaril for more than 12 months.  Confirmed with [facility] and also with [facility].  Clozaril was increased to 400mg  q day and 450 mg qhs on 6/21.    On 7/1 here in ER for worsening paranoia  On 7/29 Brought in to hospital for syncope and hypotension ---likely secondary to increased dose of clozaril.  Past Medical History:  Past Medical History:  Diagnosis Date  . Diabetes mellitus without complication ([place])   . Hypertension     Past Surgical History:  Procedure Laterality Date  . KNEE ARTHROSCOPY     Family History:  Family History  Problem Relation Age of Onset  . Diabetes Mother    Family Psychiatric  History: Unknown Social History:   History  Alcohol Use No     History  Drug Use No    Social History   Social History  . Marital status: Single    Spouse name: N/A  . Number of children: N/A  . Years of education: N/A   Social History Main Topics  . Smoking status: Current Every Day Smoker    Packs/day: 1.00    Types: Cigarettes  . Smokeless tobacco: Never Used  . Alcohol use No  . Drug use: No  . Sexual activity: No   Other Topics Concern  . None   Social History Narrative  . None   Additional Social History:                         Sleep: Fair  Appetite:  Fair  Current Medications: Current Facility-Administered Medications  Medication Dose Route Frequency Provider Last Rate Last Dose  . acetaminophen (TYLENOL) tablet 650 mg  650 mg Oral Q6H PRN [name], [name], MD   650 mg at 10/02/16 0336  . alum & mag hydroxide-simeth (MAALOX/MYLANTA) 200-200-20 MG/5ML suspension 30 mL  30 mL Oral Q4H PRN [name], John T, MD      . cholecalciferol (VITAMIN D) tablet 1,000 Units  1,000 Units Oral Daily [name], [name], MD   1,000 Units at 10/02/16 0836  . cloZAPine (CLOZARIL) tablet 200 mg  200 mg Oral Daily Hildred Priest, MD   200 mg at 10/02/16 0836  . cloZAPine (  CLOZARIL) tablet 400 mg  400 mg Oral QHS Hildred Priest, MD   400 mg at 10/01/16 2117  . desmopressin (DDAVP) tablet 0.2 mg  0.2 mg Oral QHS [name], John T, MD   0.2 mg at 10/01/16 2117  . divalproex (DEPAKOTE) DR tablet 500 mg  500 mg Oral Q12H Hildred Priest, MD   500 mg at 10/02/16 0836  . docusate sodium (COLACE) capsule 100 mg  100 mg Oral BID Hildred Priest, MD   100 mg at 10/02/16 0836  . LORazepam (ATIVAN) tablet 2 mg  2 mg Oral Q4H PRN Hildred Priest, MD      . magnesium hydroxide (MILK OF MAGNESIA) suspension 30 mL  30 mL Oral Daily PRN [name], John T, MD      . metoprolol tartrate (LOPRESSOR) tablet 12.5 mg  12.5 mg Oral BID Hildred Priest, MD   12.5 mg at  10/02/16 0836  . OLANZapine (ZYPREXA) injection 10 mg  10 mg Intramuscular BID PRN Hildred Priest, MD       Or  . OLANZapine zydis (ZYPREXA) disintegrating tablet 10 mg  10 mg Oral BID PRN Hildred Priest, MD      . polyethylene glycol (MIRALAX / GLYCOLAX) packet 17 g  17 g Oral Daily Hildred Priest, MD   17 g at 10/02/16 5621    Lab Results:  No results found for this or any previous visit (from the past 48 hour(s)).  Blood Alcohol level:  Lab Results  Component Value Date   ETH <5 09/29/2016   ETH <5 30/86/5784    Metabolic Disorder Labs: Lab Results  Component Value Date   HGBA1C 5.4 09/30/2016   MPG 108.28 09/30/2016   MPG 114 09/17/2016   No results found for: PROLACTIN Lab Results  Component Value Date   CHOL 135 09/30/2016   TRIG 62 09/30/2016   HDL 60 09/30/2016   CHOLHDL 2.3 09/30/2016   VLDL 12 09/30/2016   LDLCALC 63 09/30/2016    Physical Findings: AIMS:  , ,  ,  ,    CIWA:    COWS:     Musculoskeletal: Strength & Muscle Tone: within normal limits Gait & Station: normal Patient leans: N/A  Psychiatric Specialty Exam: Physical Exam  ROS  Blood pressure 122/79, pulse 98, temperature 97.8 F (36.6 C), temperature source Oral, resp. rate 18, height 6\' 1"  (1.854 m), weight 146 lb (66.2 kg), SpO2 98 %.Body mass index is 19.26 kg/m.  General Appearance: Casual  Eye Contact:  Fair  Speech:  Garbled  Volume:  Normal  Mood:  Euthymic  Affect:  Congruent  Thought Process:  Disorganized  Orientation:  Full (Time, Place, and Person)  Thought Content:  Tangential  Suicidal Thoughts:  No  Homicidal Thoughts:  No  Memory:  Immediate;   Poor Recent;   Poor Remote;   Poor  Judgement:  Impaired  Insight:  Lacking  Psychomotor Activity:  Normal  Concentration:  Concentration: Fair and Attention Span: Poor  Recall:  Poor  Fund of Knowledge:  Poor  Language:  Fair  Akathisia:  No  Handed:  Right  AIMS (if indicated):      Assets:  Communication Skills  ADL's:  Intact  Cognition:  Impaired,  Moderate  Sleep:  Number of Hours: 6     Treatment Plan Summary: Daily contact with patient to assess and evaluate symptoms and progress in treatment and Medication management   Pt with schizophrenia maintained on high dose clozaril for >12 months. Despite compliance  pt started to decompensate about 2 months ago.  Dose of clozaril was increased by outpt psychiatrist w/o benefit. June 21/18  Pt has been in our ER 3 times since then:  7/1 worsening paranoia (seen by [place] 05 mg bid recommended)  7/29 syncope and hypotension (admitted to hospitalist service)  8/9 incontinence, disorganization and hallucinations.  Spoke with [facility] and pharmacy. Clozaril info/dose has been confirmed.  No allergies in his file per pharmacist.  Per home meds he is on multiple anticholinergics: vistaril qhs, clozaril and cogentin 2 mg bid-----potentially he could be having episodes of confusion.  Incontinence could be due to urinary retention.  Recent syncope/falls could be the result of clozaril induced orthostatic hypotension.  Plan:  Falls: Restricted patient to his room since he seems to want to fall. It has been observed through cameras and also by staff that patient looks for people around him and tries to initiate a fall. Unclear warts motivating this behavior but for now he will be restricted to his room.  Schizophrenia: will restart clozaril at a lower dose for now. 200 mg q am and 400 mg po qhs (prior dose 400/450mg ).  Clozaril level was drawn this am.  Moderate MR: lives in a [facility], sister is guardian.  Seizure: risk of seizures due to high dose clozaril use--will start depakote 500 mg po bid  Clozaril induced tachycardia: metoprolol 12.5 mg po bid  Clozaril induced constipation: will start miralax daily and colace bid  Enuresis: continue desmopressin 0.2 mg qhs  Agitation: has orders for zyprexa 10  mg bid PO/IM prn.  Also has orders for ativan 2 mg q 4 h prn  Tobacco use: will order nicotine patch  Vit D deficiency: continue 1000 U q day  Pharmacy consult for clozaril monitoring  Labs: HbA1c 5.4.  TSH is low will need to recheck prior to discharge.  EKG: QTC 456  Head CT 7/28 wnl  Collateral info needed from guardian and psychiatrist.  F/u:  [facility]  [facility]po: back to [facility] once stable   Mike Berntsen, MD 10/02/2016, 1:28 PM

## 2016-10-02 NOTE — Progress Notes (Addendum)
   10/02/16 1310  What Happened  Was fall witnessed? Yes  Who witnessed fall? Savanah Ferguson(security)  Patients activity before fall ambulating-unassisted  Point of contact buttocks;back;head  Was patient injured? Unsure  Follow Up  MD notified Dr. Einar Grad  Time MD notified 1321  Family notified Yes-comment (voice mail left for guardian to return call)  Time family notified 1330  Additional tests No  Simple treatment Other (comment) (none)  Progress note created (see row info) Yes  Adult Fall Risk Assessment  Risk Factor Category (scoring not indicated) Fall has occurred during this admission (document High fall risk)  Patient's Fall Risk High Fall Risk (>13 points)  Vitals  BP (!) 148/89  BP Location Left Arm  BP Method Automatic  Patient Position (if appropriate) Sitting  Pulse Rate (!) 115  Pulse Rate Source Dinamap  Resp (!) 22  Oxygen Therapy  SpO2 100 %  O2 Device Room Air  Pain Assessment  Pain Assessment No/denies pain  Pain Score 0  Neurological  Level of Consciousness Alert  Cognition Appropriate at baseline  Speech Appropriate at baseline  Musculoskeletal  Musculoskeletal (WDL) WDL  Assistive Device None  Weight Bearing Restrictions No   Patient ambulating in hall.  Security officer informed him to come with her to his room.  Patient holds to handrail and turns and falls on back.  Per security officer patient hit his head on the floor.  No bruise or reddened area noted.  No swelling noted.  Patient escorted back to room to bed.  Patient states "I lost my balance and fell"  Reports that front part of head hurts although fell on back and hit back of head.  Dr. Einar Grad notified.

## 2016-10-02 NOTE — Progress Notes (Addendum)
   10/02/16 6440  What Happened  Was fall witnessed? Yes  Who witnessed fall? Avice Funchess P  Patients activity before fall ambulating-unassisted;shower  Point of contact hip/leg  Was patient injured? Unsure  Follow Up  MD notified Einar Grad  Time MD notified [phone]  Family notified Yes-comment  Time family notified [phone]  Additional tests No  Simple treatment Other (comment) (medication )  Progress note created (see row info) Yes  Adult Fall Risk Assessment  Risk Factor Category (scoring not indicated) High fall risk per protocol (document High fall risk)  Age 56  Fall History: Fall within 6 months prior to admission 5  Elimination; Bowel and/or Urine Incontinence 2  Elimination; Bowel and/or Urine Urgency/Frequency 2  Medications: includes PCA/Opiates, Anti-convulsants, Anti-hypertensives, Diuretics, Hypnotics, Laxatives, Sedatives, and Psychotropics 3  Patient Care Equipment 0  Mobility-Assistance 0  Mobility-Gait 0  Mobility-Sensory Deficit 0  Altered awareness of immediate physical environment 0  Impulsiveness 2  Lack of understanding of one's physical/cognitive limitations 0  Total Score 14  Patient's Fall Risk High Fall Risk (>13 points)  Adult Fall Risk Interventions  Required Bundle Interventions *See Row Information* High fall risk - low, moderate, and high requirements implemented  Additional Interventions Assess orthostatic BP  Screening for Fall Injury Risk  Risk For Fall Injury- See Row Information  None identified  Pain Assessment  Pain Assessment 0-10  Pain Score 7  Pain Type Acute pain  Pain Location Leg  Pain Orientation Left;Upper  Pain Intervention(s) Medication (See eMAR)  Neurological  Neuro (WDL) WDL  Musculoskeletal  Musculoskeletal (WDL) WDL  Integumentary  Integumentary (WDL) WDL   Pt had urinated on himself and he was awakened to get the linens changed and pt cleaned up. Pt was asked if he wanted to was off or get into the shower, pt stated he wanted to  get in the shower. Pt was getting in the shower and complained of the water being too cold and pt tried to back out of the shower and tripped on the bottom of the shower. Writer caught pt, and stuck foot out to reduce pt impact, pt continued down to the floor of the shower hitting L-leg on bottom of the shower. Dr Einar Grad and Pt Legal Guardian [facility]) were contacted  And informed of the incident. Pt complained of pain in L-leg 7/ 10 so he was given Tylenol . Pt did not hit his head, so protocol will continue

## 2016-10-02 NOTE — Progress Notes (Signed)
Nursing 1:1 note D:Pt observed sleeping in bed with eyes closed. RR even and unlabored. No distress noted. A: 1:1 observation continues for safety  R: pt remains safe  

## 2016-10-02 NOTE — Progress Notes (Signed)
Nursing 1:1 note D:Pt observed sitting in dayroom. RR even and unlabored. No distress noted.Pt continues to present with delayed cognitive mentality, which make it hard for him to interact with his peers, pt is very childlike and concrete with his thinking.   A: 1:1 observation continues for safety  R: pt remains safe

## 2016-10-02 NOTE — Progress Notes (Signed)
D: Pt denies SI/HI/AVH. Pt is pleasant and cooperative. Pt appears to be responding at times, pt appears to get disorganized at times, needs constant reassurance.   A: Pt was offered support and encouragement. Pt was given scheduled medications. Pt was encourage to attend groups. Q 15 minute checks were done for safety.   R:Pt attends groups and interacts well with peers and staff. Pt is taking medication. Pt has no complaints.Pt receptive to treatment and safety maintained on unit.

## 2016-10-02 NOTE — [facility] Group Notes (Signed)
[place] LCSW Group Therapy  10/02/2016 3:15 PM  Type of Therapy:  Group Therapy  Participation Level:  Did Not Attend  Summary of Progress/Problems:  Austin Gates, Austin Gates 10/02/2016, 3:15 PM

## 2016-10-02 NOTE — Progress Notes (Signed)
Nursing 1:1 note D:Pt observed in dayroom, interacting as best as he can. RR even and unlabored. No distress noted. A: 1:1 observation continues for safety  R: pt remains safe

## 2016-10-02 NOTE — Plan of Care (Signed)
Problem: Safety: Goal: Ability to remain free from injury will improve Outcome: Progressing Pt on 1:1 sitter for safety, due to pt falling

## 2016-10-02 NOTE — Plan of Care (Signed)
Problem: Safety: Goal: Ability to remain free from injury will improve Outcome: Progressing Patient remains safe and without injury during hospitalization and on Q 15 minute observation. Will continue to monitor patient.   

## 2016-10-03 LAB — CLOZAPINE (CLOZARIL)
Clozapine Lvl: 431 ng/mL (ref 350–650)
NorClozapine: 202 ng/mL
Total(Cloz+Norcloz): 633 ng/mL

## 2016-10-03 MED ORDER — CLOZAPINE 100 MG PO TABS
100.0000 mg | ORAL_TABLET | Freq: Two times a day (BID) | ORAL | Status: DC
  Administered 2016-10-04 – 2016-10-07 (×7): 100 mg via ORAL
  Filled 2016-10-03 (×7): qty 1

## 2016-10-03 MED ORDER — CLOZAPINE 100 MG PO TABS
300.0000 mg | ORAL_TABLET | Freq: Every day | ORAL | Status: DC
  Administered 2016-10-03 – 2016-10-06 (×4): 300 mg via ORAL
  Filled 2016-10-03 (×4): qty 3

## 2016-10-03 MED ORDER — NICOTINE 21 MG/24HR TD PT24
21.0000 mg | MEDICATED_PATCH | Freq: Every day | TRANSDERMAL | Status: DC
  Administered 2016-10-03 – 2016-10-07 (×5): 21 mg via TRANSDERMAL
  Filled 2016-10-03 (×5): qty 1

## 2016-10-03 MED ORDER — CLOZAPINE 100 MG PO TABS
100.0000 mg | ORAL_TABLET | Freq: Every day | ORAL | Status: DC
Start: 2016-10-04 — End: 2016-10-03

## 2016-10-03 MED ORDER — DOCUSATE SODIUM 100 MG PO CAPS
200.0000 mg | ORAL_CAPSULE | Freq: Two times a day (BID) | ORAL | Status: DC
Start: 2016-10-03 — End: 2016-10-07
  Administered 2016-10-03 – 2016-10-07 (×8): 200 mg via ORAL
  Filled 2016-10-03 (×8): qty 2

## 2016-10-03 NOTE — Progress Notes (Signed)
Patient appropriate behavior with 1:1. 

## 2016-10-03 NOTE — Progress Notes (Addendum)
[facility] MD Progress Note  10/03/2016 12:29 PM Austin Gates  MRN:  [id number] Subjective:    56 year old man, with history of schizophrenia and moderate intellectual disability, under the guardianship of his sister, was brought to our ER on 8/9 by his caregiver with reports that he had been pounding his head against the wall.  Pt lives in a group home and has been there for about 2 years.    He admitted freely however that he had been punching his fist into the wall putting holes in the drywall and also banging his head against the wall. Patient tells me he was hearing voices and having suicidal thoughts prior to admission but he is unable to elaborate.    Approximately a month ago, the patient started "staging fake falls. He'll wait to you get close to someone or make eye contact and hold on to something, then fall on the floor and roll over." However, within the last two weeks, he started using the bathroom on his self. In the middle of the night, he would urinate "on his bed, not in it and in his closet." On 09/29/2016, he had three bowel movements on his self before 3:30pm.   He has been on 800 mg of clozaril for more than 12 months.  Confirmed with [facility] and also with [facility].  Clozaril was increased to 400mg  q day and 450 mg qhs on 6/21.    On 7/1 here in ER for worsening paranoia  On 7/29 Brought in to hospital for syncope and hypotension ---likely secondary to increased dose of clozaril.  8/11 Patient seen ambulating about the unit and he was seen this morning. Chart was reviewed electronically. Patient pleasant and cooperative. Speech is garbled and difficult to understand.  He is tangential in his speech and thinking. Cognition is impaired. Denies suicidal thoughts.  8/12  Patient seen sitting in the day room. He is not responsive to questions today. This clinician got a call at 3:30 AM saying that patient was taking a shower and had a fall but he was prevented  from having the fall by staff. Again later this morning patient appeared to have a fall, but staff reports on the cc  cameras he did not actually hit his head. Patient appears to be faking several falls for an unknown reason. We will restrict him to his room for now. He is otherwise cooperative on the unit.  8/13 patient had multiple falls this weekend. Staff believed that some of them are true syncopal episodes and some others are for attention. He has not had any other episodes of bowel incontinence. He has had nocturnal enuresis, which is likely secondary to treatment with Clozaril.  Patient tells me he is hearing voices that tell him to do bad things and hurt himself. He likes the hospital and does not want to be discharged yet. He was to leave on Wednesday. Parents that he does not appear to be interacting to internal stimuli. He has been pleasant and cooperative. He has been compliant with all of the medications. No aggression or agitation has been reported.  Since admission patient has not had any episodes of head banging or punching walls.  Per nursing: Patient appears to have moments of disorientation and has to be redirected to which area he needs to be. Later today around 1310 patient was crawling on floor for a few seconds then got up and feel towards wall and then hit floor. Patient reports he hit his head. No  apparent injury noted. Patient was placed on a 1:1 sitter for safety. Patient denies any SI/HI/AH/VH. Patient had a visit with his sister. Sister pointed out an area on his back and it appears to be a healed hole, but slightly inflamed around the puncture site. Patient state it hurts a little. Patient is alert and oriented x 4, breathing unlabored, and extremities x 4 within normal limits.  Patient did not display any disruptive behavior.  Will continue to monitor patient and notify MD of any changes.    Principal Problem: Schizophrenia ([place]) Diagnosis:   Patient Active Problem List    Diagnosis Date Noted  . Tobacco use disorder [F17.200] 09/30/2016  . 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 Schizophrenia ([place]) [F20.9] 09/29/2016   Total Time spent with patient: 30 minutes  Past Psychiatric History: f/u with Dr. Tamera Punt at [facility] [phone]. He has been on 800 mg of clozaril for more than 12 months.  Confirmed with [facility] and also with [facility].  Clozaril was increased to 400mg  q day and 450 mg qhs on 6/21.    On 7/29 Brought in to hospital for syncope and hypotension ---likely secondary to increased dose of clozaril.  On 7/1 here in ER for worsening paranoia  Past Medical History:  Past Medical History:  Diagnosis Date  . Diabetes mellitus without complication ([place])   . Hypertension     Past Surgical History:  Procedure Laterality Date  . KNEE ARTHROSCOPY     Family History:  Family History  Problem Relation Age of Onset  . Diabetes Mother    Family Psychiatric  History: unknown  Social History:  History  Alcohol Use No     History  Drug Use No    Social History   Social History  . Marital status: Single    Spouse name: N/A  . Number of children: N/A  . Years of education: N/A   Social History Main Topics  . Smoking status: Current Every Day Smoker    Packs/day: 1.00    Types: Cigarettes  . Smokeless tobacco: Never Used  . Alcohol use No  . Drug use: No  . Sexual activity: No   Other Topics Concern  . None   Social History Narrative  . None    Current Medications: Current Facility-Administered Medications  Medication Dose Route Frequency Provider Last Rate Last Dose  . acetaminophen (TYLENOL) tablet 650 mg  650 mg Oral Q6H PRN [name], [name], MD   650 mg at 10/02/16 0336  . alum & mag hydroxide-simeth (MAALOX/MYLANTA) 200-200-20 MG/5ML suspension 30 mL  30 mL Oral Q4H PRN [name], John T, MD      . cholecalciferol (VITAMIN D) tablet 1,000 Units  1,000 Units Oral Daily [name],  [name], MD   1,000 Units at 10/03/16 0855  . [START ON 10/04/2016] cloZAPine (CLOZARIL) tablet 100 mg  100 mg Oral BID Hildred Priest, MD      . cloZAPine (CLOZARIL) tablet 300 mg  300 mg Oral QHS Hildred Priest, MD      . desmopressin (DDAVP) tablet 0.2 mg  0.2 mg Oral QHS [name], John T, MD   0.2 mg at 10/02/16 2210  . divalproex (DEPAKOTE) DR tablet 500 mg  500 mg Oral Q12H Hildred Priest, MD   500 mg at 10/03/16 0854  . docusate sodium (COLACE) capsule 200 mg  200 mg Oral BID Hildred Priest, MD      . magnesium hydroxide (MILK OF MAGNESIA) suspension 30 mL  30 mL Oral Daily PRN [name], John T, MD      . metoprolol tartrate (LOPRESSOR) tablet 12.5 mg  12.5 mg Oral BID Hildred Priest, MD   12.5 mg at 10/03/16 0854  . nicotine (NICODERM CQ - dosed in mg/24 hours) patch 21 mg  21 mg Transdermal Daily Hernandez-Gonzalez, Seth Bake, MD      . polyethylene glycol (MIRALAX / GLYCOLAX) packet 17 g  17 g Oral Daily Hildred Priest, MD   17 g at 10/03/16 0855    Lab Results: No results found for this or any previous visit (from the past 48 hour(s)).  Blood Alcohol level:  Lab Results  Component Value Date   ETH <5 09/29/2016   ETH <5 49/17/9150    Metabolic Disorder Labs: Lab Results  Component Value Date   HGBA1C 5.4 09/30/2016   MPG 108.28 09/30/2016   MPG 114 09/17/2016   No results found for: PROLACTIN Lab Results  Component Value Date   CHOL 135 09/30/2016   TRIG 62 09/30/2016   HDL 60 09/30/2016   CHOLHDL 2.3 09/30/2016   VLDL 12 09/30/2016   LDLCALC 63 09/30/2016    Physical Findings: AIMS:  , ,  ,  ,    CIWA:    COWS:     Musculoskeletal: Strength & Muscle Tone: within normal limits Gait & Station: normal Patient leans: N/A  Psychiatric Specialty Exam: Physical Exam  Constitutional: He is oriented to person, place, and time. He appears well-developed and well-nourished.  HENT:  Head:  Normocephalic and atraumatic.  Eyes: Conjunctivae and EOM are normal.  Neck: Normal range of motion.  Respiratory: Effort normal.  Musculoskeletal: Normal range of motion.  Neurological: He is alert and oriented to person, place, and time.    Review of Systems  Constitutional: Negative.   HENT: Negative.   Eyes: Negative.   Respiratory: Negative.   Cardiovascular: Negative.   Gastrointestinal: Negative.   Genitourinary: Negative.   Musculoskeletal: Positive for falls.  Skin: Negative.   Neurological: Negative.   Endo/Heme/Allergies: Negative.   Psychiatric/Behavioral: Positive for hallucinations. Negative for depression, memory loss, substance abuse and suicidal ideas. The patient is not nervous/anxious and does not have insomnia.     Blood pressure 116/71, pulse 94, temperature 98.4 F (36.9 C), temperature source Oral, resp. rate 18, height 6\' 1"  (1.854 m), weight 66.2 kg (146 lb), SpO2 (!) 81 %.Body mass index is 19.26 kg/m.  General Appearance: Fairly Groomed  Eye Contact:  Good  Speech:  Clear and Coherent  Volume:  Normal  Mood:  Euthymic  Affect:  Appropriate and Congruent  Thought Process:  Coherent and Descriptions of Associations: Intact  Orientation:  Full (Time, Place, and Person)  Thought Content:  Logical  Suicidal Thoughts:  No  Homicidal Thoughts:  No  Memory:  Immediate;   Poor Recent;   Poor Remote;   Poor  Judgement:  Impaired  Insight:  Lacking  Psychomotor Activity:  Normal  Concentration:  Concentration: Poor and Attention Span: Poor  Recall:  Poor  Fund of Knowledge:  Poor  Language:  Fair  Akathisia:  No  Handed:    AIMS (if indicated):     Assets:  Armed forces logistics/support/administrative officer Social Support  ADL's:  Intact  Cognition:  Impaired,  Moderate  Sleep:  Number of Hours: 7.3     Treatment Plan Summary:  Pt with schizophrenia maintained on high dose clozaril for >12 months. Despite compliance pt started to decompensate about 2 months ago.  Dose  of clozaril was increased by outpt psychiatrist w/o benefit. June 21/18  Pt has been in our ER 3 times since then:  7/1 worsening paranoia (seen by [place] 05 mg bid recommended)  7/29 syncope and hypotension (admitted to hospitalist service)  8/9 incontinence, disorganization and hallucinations.  Spoke with [facility] and pharmacy. Clozaril info/dose has been confirmed.  No allergies in his file per pharmacist.  Per home meds he is on multiple anticholinergics: vistaril qhs, clozaril and cogentin 2 mg bid-----potentially he could be having episodes of confusion.  Incontinence could be due to urinary retention.  Recent syncope/falls could be the result of clozaril induced orthostatic hypotension.  Plan:  Schizophrenia: During this hospitalization the Clozaril has been decreased. Today a plan to decrease it further as he continued to have falls over the weekend.  100 mg po bid and 300mg  po qhs.  Clozaril level was drawn last Friday results are pending.  Moderate MR: lives in a [facility], sister is guardian.  Seizure: risk of seizures due to high dose clozaril use--Started depakote 500 mg po bid---will check level tomorrow am  Clozaril induced tachycardia: started on metoprolol 12.5 mg po bid  Clozaril induced constipation: started on miralax daily and colace bid  Enuresis: continue desmopressin 0.2 mg qhs  Agitation: has orders for zyprexa 10 mg bid PO/IM prn.  Also has orders for ativan 2 mg q 4 h prn---has not needed any prns since admission  Tobacco use: continue nicotine patch  Vit D deficiency: continue Vit d 1000 U q day  Pharmacy consult for clozaril monitoring  Labs: HbA1c 5.4.  TSH is low will need to recheck prior to discharge--- will recheck tomorrow  EKG: QTC 456  Head CT 7/28 wnl  VS: will check tid along with orthostatics  Collateral info needed from guardian and psychiatrist---spoke with pt's sister and gave her an update  F/u:  [facility]---Spoke with Dr Tamera Punt. He will call me back after reviewing pt's chart  Dispo: back to [facility] once stable--in the next 24-48 h    Hildred Priest, MD 10/03/2016, 12:29 PM

## 2016-10-03 NOTE — Progress Notes (Signed)
Pt denies SI, HI or A/V hallucinations. Pt contract to safety. Continue 1:1, due to high fall risk. Pt medications compliant, no adverse affects noted. No distress noted. 15 min safety checks continue.

## 2016-10-03 NOTE — Progress Notes (Signed)
Patient attending group.Sitter with patient for safety.

## 2016-10-03 NOTE — Progress Notes (Signed)
Patient appropriate behavior with 1:1.No incontinent episodes noted today.

## 2016-10-03 NOTE — Progress Notes (Signed)
Nursing 1:1 note D:Pt observed sleeping in bed with eyes closed. RR even and unlabored. No distress noted. A: 1:1 observation continues for safety  R: pt remains safe  

## 2016-10-03 NOTE — Progress Notes (Signed)
Patient at the courtyard.Appropriate with staff & peers.Sitter with patient.

## 2016-10-03 NOTE — Progress Notes (Addendum)
Nursing 1:1 note D:Pt observed sitting in bed getting vital signs. RR even and unlabored. No distress noted.Pt taken to bathroom A: 1:1 observation continues for safety  R: pt remains safe

## 2016-10-03 NOTE — Progress Notes (Signed)
Patient express child like behaviors as holding hand with staff and walking.compliant with medications.Denies SI/HI and AVH.Sitter with patient.

## 2016-10-03 NOTE — Progress Notes (Signed)
Recreation Therapy Notes  Date: 08.13.18 Time: 9:30 am Location: Craft Room  Group Topic: Self-expression  Goal Area(s) Addresses:  Patient will identify one color per emotion listed on wheel. Patient will verbalize benefit of using art as a means of self-expression. Patient will verbalize one emotion experienced during session. Patient will be educated on other forms of self-expression.  Behavioral Response: Attentive, Interactive  Intervention: Emotion Wheel  Activity: Patients were given an Licensed conveyancer and were instructed to pick a color for each emotion listed on the worksheet.  Education: LRT educated patients on other forms of self-expression.  Education Outcome: In group clarification offered   Clinical Observations/Feedback: Patient picked a color for each emotion listed with assistance from sitter. Patient contributed to group discussion by stating what colors he picked for each emotion with assistance from his sitter. Patient was focused on coming here for having voices telling him to hurt himself.  Leonette Monarch, LRT/CTRS 10/03/2016 10:14 AM

## 2016-10-03 NOTE — Progress Notes (Signed)
Appetite and energy level good.Patient appropriate with sitter.

## 2016-10-03 NOTE — Plan of Care (Signed)
Problem: Activity: Goal: Sleeping patterns will improve Patient will be able to sleep at 6 hours each night  Outcome: Progressing Pt will get 5 to 6 hours of interrupted sleep this shift.

## 2016-10-03 NOTE — [facility] Group Notes (Signed)
[place] LCSW Group Therapy Note  Date/Time: 10/03/16, 1300  Type of Therapy and Topic:  Group Therapy:  Overcoming Obstacles  Participation Level:  Off topic consistently  Description of Group:    In this group patients will be encouraged to explore what they see as obstacles to their own wellness and recovery. They will be guided to discuss their thoughts, feelings, and behaviors related to these obstacles. The group will process together ways to cope with barriers, with attention given to specific choices patients can make. Each patient will be challenged to identify changes they are motivated to make in order to overcome their obstacles. This group will be process-oriented, with patients participating in exploration of their own experiences as well as giving and receiving support and challenge from other group members.  Therapeutic Goals: 1. Patient will identify personal and current obstacles as they relate to admission. 2. Patient will identify barriers that currently interfere with their wellness or overcoming obstacles.  3. Patient will identify feelings, thought process and behaviors related to these barriers. 4. Patient will identify two changes they are willing to make to overcome these obstacles:    Summary of Patient Progress: Pt is consistently interrupting others in group and unable to focus on the topic.  Pt tells stories about smoking cigarettes and [employer].  Requires very frequent redirection.      Therapeutic Modalities:   Cognitive Behavioral Therapy Solution Focused Therapy Motivational Interviewing Relapse Prevention Therapy  Lurline Idol, LCSW

## 2016-10-03 NOTE — Progress Notes (Signed)
Nursing 1:1 note D:Pt observed sitting on bed getting vital signs. RR even and unlabored. No distress noted.Pt taken to the bathroom A: 1:1 observation continues for safety  R: pt remains safe

## 2016-10-03 NOTE — Progress Notes (Signed)
CSW informed by Dr Lemmie Evens that pt can discharge tomorrow.  CSW spoke with Kennon Portela of group home who is available to pick pt up tomorrow and asked that she be called 30 minutes in advance.  CSW spoke with legal guardian Catalina Gravel and informed her of discharge as well.  She had several questions for Dr Lemmie Evens.  CSW left message with [facility] regarding follow up appt. Winferd Humphrey, MSW, LCSW Clinical Social Worker 10/03/2016 3:20 PM

## 2016-10-04 ENCOUNTER — Encounter: Payer: Self-pay | Admitting: Psychiatry

## 2016-10-04 DIAGNOSIS — F250 Schizoaffective disorder, bipolar type: Secondary | ICD-10-CM

## 2016-10-04 LAB — VALPROIC ACID LEVEL: Valproic Acid Lvl: 69 ug/mL (ref 50.0–100.0)

## 2016-10-04 MED ORDER — DOCUSATE SODIUM 100 MG PO CAPS: 100.0000 mg | ORAL_CAPSULE | Freq: Two times a day (BID) | ORAL | 0 refills | Status: DC

## 2016-10-04 MED ORDER — DIVALPROEX SODIUM 500 MG PO DR TAB: 500.0000 mg | DELAYED_RELEASE_TABLET | Freq: Two times a day (BID) | ORAL | 0 refills | Status: DC

## 2016-10-04 MED ORDER — CLOZAPINE 100 MG PO TABS
100.0000 mg | ORAL_TABLET | Freq: Two times a day (BID) | ORAL | 0 refills | Status: DC
Start: 2016-10-04 — End: 2016-10-06

## 2016-10-04 MED ORDER — CLOZAPINE 100 MG PO TABS
300.0000 mg | ORAL_TABLET | Freq: Every day | ORAL | 0 refills | Status: DC
Start: 2016-10-04 — End: 2016-10-06

## 2016-10-04 MED ORDER — CLOZAPINE 100 MG PO TABS
300.0000 mg | ORAL_TABLET | Freq: Every day | ORAL | 0 refills | Status: DC
Start: 2016-10-04 — End: 2016-10-04

## 2016-10-04 MED ORDER — CLOZAPINE 100 MG PO TABS: 300.0000 mg | ORAL_TABLET | Freq: Every day | ORAL | 0 refills | Status: DC

## 2016-10-04 MED ORDER — CLOZAPINE 100 MG PO TABS: 100.0000 mg | ORAL_TABLET | Freq: Two times a day (BID) | ORAL | 0 refills | Status: DC

## 2016-10-04 MED ORDER — METOPROLOL TARTRATE 25 MG PO TABS
12.5000 mg | ORAL_TABLET | Freq: Two times a day (BID) | ORAL | Status: DC
Start: 2016-10-05 — End: 2016-10-07
  Administered 2016-10-05 – 2016-10-07 (×5): 12.5 mg via ORAL
  Filled 2016-10-04 (×5): qty 1

## 2016-10-04 MED ORDER — METOPROLOL TARTRATE 25 MG PO TABS: 25.0000 mg | ORAL_TABLET | Freq: Two times a day (BID) | ORAL | Status: DC

## 2016-10-04 MED ORDER — LORAZEPAM 2 MG PO TABS
2.0000 mg | ORAL_TABLET | ORAL | Status: DC | PRN
  Administered 2016-10-04: 2 mg via ORAL
  Filled 2016-10-04: qty 1

## 2016-10-04 MED ORDER — METOPROLOL TARTRATE 25 MG PO TABS: 25.0000 mg | ORAL_TABLET | Freq: Two times a day (BID) | ORAL | 0 refills | Status: DC

## 2016-10-04 MED ORDER — DIVALPROEX SODIUM 500 MG PO DR TAB: 1000.0000 mg | DELAYED_RELEASE_TABLET | Freq: Every day | ORAL | Status: DC

## 2016-10-04 MED ORDER — DIVALPROEX SODIUM 500 MG PO DR TAB: 500.0000 mg | DELAYED_RELEASE_TABLET | Freq: Every day | ORAL | Status: DC

## 2016-10-04 MED ORDER — POLYETHYLENE GLYCOL 3350 17 G PO PACK: 17.0000 g | PACK | ORAL | 0 refills | Status: DC

## 2016-10-04 MED ORDER — LITHIUM CARBONATE ER 300 MG PO TBCR
300.0000 mg | EXTENDED_RELEASE_TABLET | Freq: Two times a day (BID) | ORAL | Status: DC
  Administered 2016-10-04 – 2016-10-07 (×7): 300 mg via ORAL
  Filled 2016-10-04 (×7): qty 1

## 2016-10-04 MED ORDER — DIVALPROEX SODIUM ER 500 MG PO TB24
1500.0000 mg | ORAL_TABLET | Freq: Every day | ORAL | Status: DC
  Administered 2016-10-04 – 2016-10-06 (×3): 1500 mg via ORAL
  Filled 2016-10-04 (×3): qty 3

## 2016-10-04 NOTE — Progress Notes (Signed)
Patient appropriate behavior with 1:1. 

## 2016-10-04 NOTE — Discharge Summary (Signed)
Physician Discharge Summary Note  Patient:  Austin Gates is an 56 y.o., male MRN:  [id number] DOB:  09-24-1960 Patient phone:  [phone] (home)  Patient address:   [address] [place] [address],  Total Time spent with patient: 30 minutes  Date of Admission:  09/29/2016 Date of Discharge: 10/07/16  Reason for Admission:  Psychosis  Principal Problem: Schizoaffective disorder, bipolar type [facility]) Discharge Diagnoses: Patient Active Problem List   Diagnosis Date Noted  . Schizoaffective disorder, bipolar type ([employer]) [F25.0] 10/04/2016  . Tobacco use disorder [F17.200] 09/30/2016  . 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  Pt lives in a group home ([name]) and has been there for about 2 years.   Patient was cooperative with the interview but was disorganized and difficult to follow. He insisted repeatedly that he was a good person and that he likes to play basketball. He admitted freely however that he had been punching his fist into the wall putting holes in the drywall and also banging his head against the wall. Patient tells me he was hearing voices and having suicidal thoughts prior to admission but he is unable to elaborate. Says that he is slept very well last night he is not longer hearing voices or having suicidal thoughts. His answers to questions were really not consistent or completely unrelated to the questions.    Approximately a month ago, the patient started "staging fake falls. He'll wait to you get close to someone or make eye contact and hold on to something, then fall on the floor and roll over." However, within the last two weeks, he started using the  bathroom on his self. In the middle of the night, he would urinate "on his bed, not in it and in his closet." On 09/29/2016, he had three bowel movements on his self before 3:30pm. Group Home reports, the only change on they are aware of is his Neuro Psychiatrist, Dr. Tamera Punt, reduced one of his medications. Since then had hasn't slept in two days. He seen his PCP and was prescribed something for sleep but is hasn't worked.  Trauma: unknown  Substance abuse: denies.  Smokes 1 pack per day  He has been on 800 mg of clozaril for more than 12 months.  Confirmed with [facility] and also with [facility].  Clozaril was increased to 400mg  q day and 450 mg qhs on 6/21.    On 7/1 here in ER for worsening paranoia  On 7/29 Brought in to hospital for syncope and hypotension ---likely secondary to increased dose of clozaril.    Associated Signs/Symptoms: Depression Symptoms:  denies (Hypo) Manic Symptoms:  denies Anxiety Symptoms:  denies Psychotic Symptoms:  denies PTSD Symptoms: NA Total Time spent with patient: 1 hour  Past Psychiatric History: f/u with Dr. Tamera Punt at [facility] [phone]. He has been on 800 mg of clozaril for more than 12 months.  Confirmed with [facility] and also with [facility].  Clozaril was increased to 400mg  q day and 450 mg qhs on 6/21.    On 7/29 Brought in to hospital for syncope and hypotension ---likely secondary to increased dose of clozaril.  On 7/1 here in ER for worsening paranoia  Past  Medical History:  Past Medical History:  Diagnosis Date  . Diabetes mellitus without complication ([place])   . Hypertension     Past Surgical History:  Procedure Laterality Date  . KNEE ARTHROSCOPY     Family History:  Family History  Problem Relation Age of Onset  . Diabetes Mother    Family Psychiatric  History: unknown  Social History:  History  Alcohol Use No     History  Drug Use No    Social History   Social  History  . Marital status: Single    Spouse name: N/A  . Number of children: N/A  . Years of education: N/A   Social History Main Topics  . Smoking status: Current Every Day Smoker    Packs/day: 1.00    Types: Cigarettes  . Smokeless tobacco: Never Used  . Alcohol use No  . Drug use: No  . Sexual activity: No   Other Topics Concern  . None   Social History Narrative  . None    Hospital Course:    Pt with schizophrenia maintained on high dose clozaril for >12 months. Despite compliance pt started to decompensate about 2 months ago.  Dose of clozaril was increased by outpt psychiatrist w/o benefit. June 21/18  Pt has been in our ER 3 times since then:  7/1 worsening paranoia (seen by [place] 05 mg bid recommended)  7/29 syncope and hypotension (admitted to hospitalist service)  8/9 incontinence, disorganization and hallucinations.  Spoke with [facility] and pharmacy. Clozaril info/dose has been confirmed. No allergies in his file per pharmacist.  Per home meds he is on multiple anticholinergics: vistaril qhs, clozaril and cogentin 2 mg bid-----potentially he could be having episodes of confusion. Incontinence could be due to urinary retention.  Recent syncope/falls could be the result of clozaril induced orthostatic hypotension.  Plan: We changed diagnosis to Schizoaffective bipolar type: on 8/14 reported to be highly energetic, he is laughing constantly inappropriately, disruptive in group.  Likely manic symptoms---he does not seem psychotic.  -Continue clozaril 100 mg q am, 100 mg at 5 pm and 300 mg qhs---no orthostatics with this dose  Clozaril level (total 633 on 8/10).  This level was obtained while pt was taking 850 mg of clozaril he however did not take clozaril the night before the level was drawn.  -Depakote level 77 on 8/16.  Continue depakote ER 1500 mg qhs  -Despite good depakote level pt appears manic.  Added lithium 300 mg po bid.  Per outpt  psychiatrist he was on lithium prior to admission.  Lithium level on 8/17 was 0.42  Moderate MR: lives in a [facility], sister is guardian.  Clozaril induced tachycardia: started on metoprolol 12.5 mg po bid  Clozaril induced constipation: continue miralax every other day and colace bid    Enuresis: continue desmopressin 0.2 mg qhs  Tobacco use: pt received nicotine patch  Vit D deficiency: continue Vit d 1000 U q day  Collateral info needed from guardian and psychiatrist---spoke with pt's sister 3 times during this hospitalization. She has no concerns about his discharge. She came to see him a few nights ago and feels he is doing much better.  F/u: [facility]---Spoke with Dr Tamera Punt. Told be pt has never been on depakote and was on lithium recently  Dispo: back to [facility] today  Labs: Creatinine, BUN and liver function  are within the normal limits.  HbA1c 5.4. TSH is low but T 3 and T4 are wnl---Will refer to  PCP for monitoring  EKG: QTC 456  Head CT 7/28 wnl  During this hospitalization the patient did appear to have manic symptoms. He had episodes on uncontrollable laughter that lasted for hours. He had also significant episodes of being hyperverbal and repeating himself a multitude of times. He also. Hypersexual and impulsive. There he listed another patient stressed up, he may comment about his penis to another patient. He made inappropriate gestures and  staring to females. Patient appeared to have no control over this.  These symptoms resolved with the addition of Depakote and lithium.  His behavior has been much more appropriate. He is not longer laughing uncontrollably or noted to be hyperverbal. No longer showing any hypersexual behavior. The patient himself feels better and says that he is no longer hearing voices. His sister came to see him and said he is much improved.  Last week he had multiple episodes of bowel incontinence and falls. Some of the falls were  felt to be secondary to orthostatic hypotension that others were pseudo falls.  Patient has not had any falls or any episodes of urinay or bowel incontinence in several days.    Physical Findings: AIMS:  , ,  ,  ,    CIWA:    COWS:     Musculoskeletal: Strength & Muscle Tone: within normal limits Gait & Station: normal Patient leans: N/A  Psychiatric Specialty Exam: Physical Exam  Constitutional: He is oriented to person, place, and time. He appears well-developed and well-nourished.  HENT:  Head: Normocephalic and atraumatic.  Eyes: Conjunctivae and EOM are normal.  Neck: Normal range of motion.  Respiratory: Effort normal.  Musculoskeletal: Normal range of motion.  Neurological: He is alert and oriented to person, place, and time.    Review of Systems  Constitutional: Negative.   HENT: Negative.   Eyes: Negative.   Respiratory: Negative.   Cardiovascular: Negative.   Gastrointestinal: Negative.   Genitourinary: Negative.   Musculoskeletal: Negative.   Skin: Negative.   Neurological: Negative.   Endo/Heme/Allergies: Negative.   Psychiatric/Behavioral: Negative.     Blood pressure 120/85, pulse (!) 106, temperature 97.9 F (36.6 C), temperature source Oral, resp. rate 18, height 6\' 1"  (1.854 m), weight 66.2 kg (146 lb), SpO2 99 %.Body mass index is 19.26 kg/m.  General Appearance: Well Groomed  Eye Contact:  Good  Speech:  Clear and Coherent  Volume:  Normal  Mood:  Euthymic  Affect:  Appropriate  Thought Process:  Linear and Descriptions of Associations: Intact Concrete thinking  Orientation:  Other:  person and place  Thought Content:  Hallucinations: None  Suicidal Thoughts:  No  Homicidal Thoughts:  No  Memory:  Immediate;   Poor Recent;   Poor Remote;   Poor  Judgement:  Poor  Insight:  Shallow  Psychomotor Activity:  Normal  Concentration:  Concentration: Poor and Attention Span: Poor  Recall:  Poor  Fund of Knowledge:  Poor  Language:  Fair   Akathisia:  No  Handed:    AIMS (if indicated):     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 (Cigarettes, Smokeless Tobacco, Cigars, and/or Pipes): Yes  Has this patient used any form of tobacco in the last 30 days? (Cigarettes, Smokeless Tobacco, Cigars, and/or Pipes) Yes, Yes, A prescription for  an FDA-approved tobacco cessation medication was offered at discharge and the patient refused  Blood Alcohol level:  Lab Results  Component Value Date   [facility] <5 09/29/2016   ETH <5 10/62/6948    Metabolic Disorder Labs:  Lab Results  Component Value Date   HGBA1C 5.4 09/30/2016   MPG 108.28 09/30/2016   MPG 114 09/17/2016   No results found for: PROLACTIN Lab Results  Component Value Date   CHOL 135 09/30/2016   TRIG 62 09/30/2016   HDL 60 09/30/2016   CHOLHDL 2.3 09/30/2016   VLDL 12 09/30/2016   LDLCALC 63 09/30/2016   Results for RANDE, ROYLANCE (MRN [id number]) as of 10/07/2016 09:30  Ref. Range 09/30/2016 07:00 09/30/2016 13:12 10/04/2016 06:57 10/06/2016 19:29 10/07/2016 07:01  COMPREHENSIVE METABOLIC PANEL Unknown     Rpt (A)  Sodium Latest Ref Range: 135 - 145 mmol/L     135  Potassium Latest Ref Range: 3.5 - 5.1 mmol/L     4.6  Chloride Latest Ref Range: 101 - 111 mmol/L     99 (L)  CO2 Latest Ref Range: 22 - 32 mmol/L     29  Glucose Latest Ref Range: 65 - 99 mg/dL     117 (H)  Mean Plasma Glucose Latest Units: mg/dL 108.28      BUN Latest Ref Range: 6 - 20 mg/dL     14  Creatinine Latest Ref Range: 0.61 - 1.24 mg/dL     1.04  Calcium Latest Ref Range: 8.9 - 10.3 mg/dL     9.7  Anion gap Latest Ref Range: 5 - 15      7  Alkaline Phosphatase Latest Ref Range: 38 - 126 U/L     77  Albumin Latest Ref Range: 3.5 - 5.0 g/dL     3.9  AST Latest Ref Range: 15 - 41 U/L     17   ALT Latest Ref Range: 17 - 63 U/L     17  Total Protein Latest Ref Range: 6.5 - 8.1 g/dL     7.1  Total Bilirubin Latest Ref Range: 0.3 - 1.2 mg/dL     0.5  GFR, Est African American Latest Ref Range: >60 mL/min     >60  GFR, Est Non African American Latest Ref Range: >60 mL/min     >60  Total CHOL/HDL Ratio Latest Units: RATIO 2.3      Cholesterol Latest Ref Range: 0 - 200 mg/dL 135      HDL Cholesterol Latest Ref Range: >40 mg/dL 60      LDL (calc) Latest Ref Range: 0 - 99 mg/dL 63      Triglycerides Latest Ref Range: <150 mg/dL 62      VLDL Latest Ref Range: 0 - 40 mg/dL 12      WBC Latest Ref Range: 3.8 - 10.6 K/uL  5.2   4.7  RBC Latest Ref Range: 4.40 - 5.90 MIL/uL  4.45   4.60  Hemoglobin Latest Ref Range: 13.0 - 18.0 g/dL  13.2   13.6  HCT Latest Ref Range: 40.0 - 52.0 %  39.6 (L)   40.7  MCV Latest Ref Range: 80.0 - 100.0 fL  89.1   88.5  MCH Latest Ref Range: 26.0 - 34.0 pg  29.6   29.7  MCHC Latest Ref Range: 32.0 - 36.0 g/dL  33.3   33.5  RDW Latest Ref Range: 11.5 - 14.5 %  13.6   13.9  Platelets Latest Ref Range: 150 -  440 K/uL  222   199  Neutrophils Latest Units: %  73   66  Lymphocytes Latest Units: %  17   22  Monocytes Relative Latest Units: %  6   6  Eosinophil Latest Units: %  3   5  Basophil Latest Units: %  1   1  NEUT# Latest Ref Range: 1.4 - 6.5 K/uL  3.8   3.1  Lymphocyte # Latest Ref Range: 1.0 - 3.6 K/uL  0.9 (L)   1.0  Monocyte # Latest Ref Range: 0.2 - 1.0 K/uL  0.3   0.3  Eosinophils Absolute Latest Ref Range: 0 - 0.7 K/uL  0.2   0.2  Basophils Absolute Latest Ref Range: 0 - 0.1 K/uL  0.1   0.0  Clozapine Lvl Latest Ref Range: 350 - 650 ng/mL 431      Lithium Latest Ref Range: 0.60 - 1.20 mmol/L     0.42 (L)  NorClozapine Latest Ref Range: Not Estab. ng/mL 202      Valproic Acid,S Latest Ref Range: 50.0 - 100.0 ug/mL   69 77   Hemoglobin A1C Latest Ref Range: 4.8 - 5.6 % 5.4      TSH Latest Ref Range: 0.450 - 4.500 uIU/mL 0.047 (L)  0.035 (L)     Thyroxine (T4) Latest Ref Range: 4.5 - 12.0 ug/dL   9.2    Free Thyroxine Index Latest Ref Range: 1.2 - 4.9    2.6    T3 Uptake Ratio Latest Ref Range: 24 - 39 %   28    Total(Cloz+Norcloz) Latest Units: ng/mL 633        See Psychiatric Specialty Exam and Suicide Risk Assessment completed by Attending Physician prior to discharge.  Discharge destination:  Other:  Group Home  Is patient on multiple antipsychotic therapies at discharge:  No   Has Patient had three or more failed trials of antipsychotic monotherapy by history:  No  Recommended Plan for Multiple Antipsychotic Therapies: NA   Allergies as of 10/07/2016   No Known Allergies     Medication List    STOP taking these medications   atenolol 25 MG tablet Commonly known as:  TENORMIN   benztropine 2 MG tablet Commonly known as:  COGENTIN   bismuth subsalicylate 419 QQ/22LN suspension Commonly known as:  PEPTO BISMOL   escitalopram 10 MG tablet Commonly known as:  LEXAPRO   hydrOXYzine 25 MG tablet Commonly known as:  ATARAX/VISTARIL   LORazepam 0.5 MG tablet Commonly known as:  ATIVAN   Melatonin 5 MG Tabs   OLANZapine 10 MG tablet Commonly known as:  ZYPREXA   traZODone 150 MG tablet Commonly known as:  DESYREL   zolpidem 5 MG tablet Commonly known as:  AMBIEN     TAKE these medications     Indication  cloZAPine 100 MG tablet Commonly known as:  CLOZARIL Take 1 tablet (100 mg total) by mouth 2 (two) times daily. What changed:  how much to take  when to take this  additional instructions  Indication:  Schizoaffective Disorder, Schizoaffective.  Give 1 tab at 8 am and 1 tab at 5 pm   cloZAPine 100 MG tablet Commonly known as:  CLOZARIL Take 3 tablets (300 mg total) by mouth at bedtime. What changed:  You were already taking a medication with the same name, and this prescription was added. Make sure you understand how and when to take each.  Indication:  Schizoaffective Disorder    desmopressin 0.2  MG tablet Commonly known as:  DDAVP Take 0.2 mg by mouth at bedtime.  Indication:  Bedwetting   divalproex 500 MG 24 hr tablet Commonly known as:  DEPAKOTE ER Take 3 tablets (1,500 mg total) by mouth at bedtime.  Indication:  schizoaffective bipolar type   docusate sodium 100 MG capsule Commonly known as:  COLACE Take 1 capsule (100 mg total) by mouth 2 (two) times daily.  Indication:  Constipation   lithium carbonate 300 MG CR tablet Commonly known as:  LITHOBID Take 1 tablet (300 mg total) by mouth every 12 (twelve) hours.  Indication:  Mania, schizoaffective   metoprolol tartrate 25 MG tablet Commonly known as:  LOPRESSOR Take 0.5 tablets (12.5 mg total) by mouth 2 (two) times daily.  Indication:  tachycardia   polyethylene glycol packet Commonly known as:  MIRALAX / GLYCOLAX Take 17 g by mouth every other day.  Indication:  Constipation   Vitamin D-3 5000 units Tabs Take 1 tablet by mouth daily.  Indication:  vit d deficiency      Follow-up Information    [facility]. Go on 10/12/2016.   Why:  Please attend your medication appointment with Dr. Arn Medal on Friday, 10/12/16, at 10:20am.  Please bring a copy of your hospital discharge paperwork. Contact information: [place],  [address] P:  [phone] F: [phone]         >30 minutes. >50 % of the time was spent in coordination of care  Signed: Hildred Priest, MD 10/07/2016, 11:36 AM

## 2016-10-04 NOTE — Progress Notes (Signed)
Pt continues 1:1 due to being a high fall risk, pt in room resting with eyes closed, no distress noted, sitter at pt's bedside. 15 min safety checks continue. Nursing hourly checks continues.

## 2016-10-04 NOTE — Progress Notes (Signed)
Resting in bed.  Sitter at bedside.  

## 2016-10-04 NOTE — Progress Notes (Signed)
  [facility] Adult Case Management Discharge Plan :  Will you be returning to the same living situation after discharge:  Yes,  returning to group home At discharge, do you have transportation home?: Yes,  group home director. Do you have the ability to pay for your medications: Yes,  Medicaid  Release of information consent forms completed and in the chart;  Patient's signature needed at discharge.  Patient to Follow up at: Follow-up Information    [facility]. Go on 10/07/2016.   Why:  Please attend your medication appointment with Dr. Arn Medal on Friday, 10/07/16, at 10:20am.  Please bring a copy of your hospital discharge paperwork. Contact information: [place], [place] [address] P:  [phone] F: [phone]          Next level of care provider has access to [place] and Suicide Prevention discussed: Yes,  legal guardian.  Have you used any form of tobacco in the last 30 days? (Cigarettes, Smokeless Tobacco, Cigars, and/or Pipes): Yes  Has patient been referred to the Quitline?: Patient refused referral  Patient has been referred for addiction treatment: [place] 10/04/2016, 10:46 AM

## 2016-10-04 NOTE — NC FL2 (Signed)
[place] LEVEL OF CARE SCREENING TOOL     IDENTIFICATION  Patient Name: Austin Gates Birthdate: [birth year] Sex: male Admission Date (Current Location): 09/29/2016  [place] and [place] Number:  Engineering geologist and Address:  [facility], [address] [place], [place] [address]      Provider Number: [id number]  Attending Physician Name and Address:  Jeoffrey Massed*  Relative Name and Phone Number:       Current Level of Care: Hospital Recommended Level of Care: [place] Prior Approval Number:    Date Approved/Denied:   PASRR Number:    Discharge Plan: Domiciliary (Rest home)    Current Diagnoses: Patient Active Problem List   Diagnosis Date Noted  . Tobacco use disorder 09/30/2016  . Moderate intellectual disability 09/30/2016  . Schizophrenia ([place]) 09/29/2016    Orientation RESPIRATION BLADDER Height & Weight     Self, Situation, Place  Normal Continent Weight: 146 lb (66.2 kg) Height:  6\' 1"  (185.4 cm)  BEHAVIORAL SYMPTOMS/MOOD NEUROLOGICAL BOWEL NUTRITION STATUS     (N/A) Continent    AMBULATORY STATUS COMMUNICATION OF NEEDS Skin   Independent Verbally Normal                       Personal Care Assistance Level of Assistance    Bathing Assistance: Independent Feeding assistance: Independent Dressing Assistance: Independent     Functional Limitations Info   (N/A)          SPECIAL CARE FACTORS FREQUENCY                       Contractures Contractures Info: Present    Additional Factors Info  Code Status, Allergies Code Status Info: Full Allergies Info: NKA           Current Medications (10/04/2016):  This is the current hospital active medication list Current Facility-Administered Medications  Medication Dose Route Frequency Provider Last Rate Last Dose  . acetaminophen (TYLENOL) tablet 650 mg  650 mg Oral Q6H PRN [name], [name], MD   650 mg at 10/03/16 1346  .  alum & mag hydroxide-simeth (MAALOX/MYLANTA) 200-200-20 MG/5ML suspension 30 mL  30 mL Oral Q4H PRN [name], John T, MD      . cholecalciferol (VITAMIN D) tablet 1,000 Units  1,000 Units Oral Daily [name], [name], MD   1,000 Units at 10/04/16 [id number]  . cloZAPine (CLOZARIL) tablet 100 mg  100 mg Oral BID Hildred Priest, MD   100 mg at 10/04/16 0826  . cloZAPine (CLOZARIL) tablet 300 mg  300 mg Oral QHS Hildred Priest, MD   300 mg at 10/03/16 2216  . desmopressin (DDAVP) tablet 0.2 mg  0.2 mg Oral QHS [name], John T, MD   0.2 mg at 10/03/16 2214  . divalproex (DEPAKOTE) DR tablet 500 mg  500 mg Oral Q12H Hildred Priest, MD   500 mg at 10/04/16 0826  . docusate sodium (COLACE) capsule 200 mg  200 mg Oral BID Hildred Priest, MD   200 mg at 10/04/16 0826  . magnesium hydroxide (MILK OF MAGNESIA) suspension 30 mL  30 mL Oral Daily PRN [name], John T, MD      . metoprolol tartrate (LOPRESSOR) tablet 12.5 mg  12.5 mg Oral BID Hildred Priest, MD   12.5 mg at 10/04/16 0826  . nicotine (NICODERM CQ - dosed in mg/24 hours) patch 21 mg  21 mg Transdermal Daily Hildred Priest, MD  21 mg at 10/04/16 0825  . polyethylene glycol (MIRALAX / GLYCOLAX) packet 17 g  17 g Oral Daily Hildred Priest, MD   17 g at 10/04/16 1833     Discharge Medications: Please see discharge summary for a list of discharge medications.  Relevant Imaging Results:  Relevant Lab Results:   Additional Information SS# [id number]  Emilie Rutter, LCSWA

## 2016-10-04 NOTE — Plan of Care (Signed)
Problem: Safety: Goal: Ability to remain free from injury will improve Outcome: Progressing Pt will remain free of falls entire shift.

## 2016-10-04 NOTE — Progress Notes (Signed)
Patient is hyper verbal but redirectable.Safety maintained with 1:1 sitter.

## 2016-10-04 NOTE — Progress Notes (Signed)
No incontinence episode noted today.Sitter at bedside.

## 2016-10-04 NOTE — Progress Notes (Signed)
Recreation Therapy Notes  Date: 08.14.18 Time: 9:30 am Location: Craft Room  Group Topic: Goal Setting  Goal Area(s) Addresses:  Patient will write at least one goal. Patient will write at least one obstacle.  Behavioral Response: Intermittently Attentive, Interactive, Off Topic  Intervention: Recovery Goal Chart  Activity: Patients were given construction paper and were instructed to make a Recovery Goal Chart including their goals, obstacles, the date they started working on their goal, and the date they achieved their goal.  Education: LRT educated patients on healthy ways to celebrate reaching their goals.  Education Outcome: In group clarification offered   Clinical Observations/Feedback: Patient worked on activity. Patient needed frequent redirection. Patient difficult to redirect.   Leonette Monarch, LRT/CTRS 10/04/2016 10:03 AM

## 2016-10-04 NOTE — Progress Notes (Signed)
CSW spoke with Dr Jerilee Hoh about pt being disruptive in group session and it was agreed that pt will not attend group as the disruption gets in the way of other pts being able to participate. Agricultural consultant, one to one staff, and techs informed. Winferd Humphrey, MSW, LCSW Clinical Social Worker 10/04/2016 2:39 PM

## 2016-10-04 NOTE — Progress Notes (Addendum)
Patient continues to be hyper verbal.Needed constant redirection.Safety maintained with sitter.

## 2016-10-04 NOTE — BHH Suicide Risk Assessment (Addendum)
[facility] Discharge Suicide Risk Assessment   Principal Problem: Schizoaffective disorder, bipolar type [facility]) Discharge Diagnoses:  Patient Active Problem List   Diagnosis Date Noted  . Schizoaffective disorder, bipolar type ([place]) [F25.0] 10/04/2016  . Tobacco use disorder [F17.200] 09/30/2016  . Moderate intellectual disability [F71] 09/30/2016     Psychiatric Specialty Exam: ROS  Blood pressure 120/85, pulse (!) 106, temperature 97.9 F (36.6 C), temperature source Oral, resp. rate 18, height 6\' 1"  (1.854 m), weight 66.2 kg (146 lb), SpO2 99 %.Body mass index is 19.26 kg/m.                                                       Mental Status Per Nursing Assessment::   On Admission:     Demographic Factors:  Male  Loss Factors: Decrease in vocational status  Historical Factors: Impulsivity  Risk Reduction Factors:   Sense of responsibility to family, Living with another person, especially a relative and Positive social support  Continued Clinical Symptoms:  Schizophrenia:   Paranoid or undifferentiated type Previous Psychiatric Diagnoses and Treatments  Cognitive Features That Contribute To Risk:  Closed-mindedness    Suicide Risk:  Minimal: No identifiable suicidal ideation.  Patients presenting with no risk factors but with morbid ruminations; may be classified as minimal risk based on the severity of the depressive symptoms   Hildred Priest, MD 10/07/2016, 9:44 AM

## 2016-10-04 NOTE — Progress Notes (Addendum)
Pt in a pleasant mood, eye contact is fair. Denies SI, HI or A/V hallucinations. Med compliant, no adverse affects noted. Hourly nursing checks continue. 15 min safety checks continues. Pt 7.5 slept  hours.

## 2016-10-04 NOTE — Progress Notes (Signed)
Patient needs constant redirection.sitter with patient.

## 2016-10-04 NOTE — BHH Group Notes (Signed)
Goals Group Date/Time: 10/04/2016 9:00 AM Type of Therapy and Topic: Group Therapy: Goals Group: SMART Goals   Participation Level: Moderate  Description of Group:    The purpose of a daily goals group is to assist and guide patients in setting recovery/wellness-related goals. The objective is to set goals as they relate to the crisis in which they were admitted. Patients will be using SMART goal modalities to set measurable goals. Characteristics of realistic goals will be discussed and patients will be assisted in setting and processing how one will reach their goal. Facilitator will also assist patients in applying interventions and coping skills learned in psycho-education groups to the SMART goal and process how one will achieve defined goal.   Therapeutic Goals:   -Patients will develop and document one goal related to or their crisis in which brought them into treatment.  -Patients will be guided by LCSW using SMART goal setting modality in how to set a measurable, attainable, realistic and time sensitive goal.  -Patients will process barriers in reaching goal.  -Patients will process interventions in how to overcome and successful in reaching goal.   Patient's Goal: Pt continues to be inappropriate and intrusive in group, needing almost constant redirection.  Pt today was laughing throughout.  Pt made a goal to have "better behavior" but is unable to do so even for short periods of time.  CSW had one to one take pt out towards the end of group so that other pts could speak.  Therapeutic Modalities:  Motivational Interviewing  Art gallery manager  SMART goals setting   Lurline Idol, [place]

## 2016-10-04 NOTE — Progress Notes (Signed)
Pt continues 1:1 due to being a high fall risk, pt in day room, sitter by pt's side. 15 min safety checks continue. Nursing hourly checks continues.

## 2016-10-04 NOTE — Progress Notes (Signed)
Compliant with medications.Appetite & energy level good.Safety maintained with sitter.

## 2016-10-04 NOTE — Progress Notes (Signed)
Patient repeatedly telling the same things happened at the group home.Orthstatic BP checked.Dr.Hernandez notified.Sitter at bedside.

## 2016-10-04 NOTE — Progress Notes (Addendum)
Pt continues 1:1 due to being a high fall risk, pt in room resting with eyes closed, no distress noted, sitter at pt's bedside. 15 min safety checks continue. Nursing hourly checks continues.Pt slept 6.45 hrs.

## 2016-10-04 NOTE — Progress Notes (Signed)
[facility] MD Progress Note  10/04/2016 12:04 PM Austin Gates  MRN:  [id number] Subjective:    56 year old man, with history of schizophrenia and moderate intellectual disability, under the guardianship of his sister, was brought to our ER on 8/9 by his caregiver with reports that he had been pounding his head against the wall.  Pt lives in a group home and has been there for about 2 years.    He admitted freely however that he had been punching his fist into the wall putting holes in the drywall and also banging his head against the wall. Patient tells me he was hearing voices and having suicidal thoughts prior to admission but he is unable to elaborate.    Approximately a month ago, the patient started "staging fake falls. He'll wait to you get close to someone or make eye contact and hold on to something, then fall on the floor and roll over." However, within the last two weeks, he started using the bathroom on his self. In the middle of the night, he would urinate "on his bed, not in it and in his closet." On 09/29/2016, he had three bowel movements on his self before 3:30pm.   He has been on 800 mg of clozaril for more than 12 months.  Confirmed with [facility] and also with [facility].  Clozaril was increased to 400mg  q day and 450 mg qhs on 6/21.    On 7/1 here in ER for worsening paranoia  On 7/29 Brought in to hospital for syncope and hypotension ---likely secondary to increased dose of clozaril.  8/11 Patient seen ambulating about the unit and he was seen this morning. Chart was reviewed electronically. Patient pleasant and cooperative. Speech is garbled and difficult to understand.  He is tangential in his speech and thinking. Cognition is impaired. Denies suicidal thoughts.  8/12  Patient seen sitting in the day room. He is not responsive to questions today. This clinician got a call at 3:30 AM saying that patient was taking a shower and had a fall but he was prevented  from having the fall by staff. Again later this morning patient appeared to have a fall, but staff reports on the cc  cameras he did not actually hit his head. Patient appears to be faking several falls for an unknown reason. We will restrict him to his room for now. He is otherwise cooperative on the unit.  8/13 patient had multiple falls this weekend. Staff believed that some of them are true syncopal episodes and some others are for attention. He has not had any other episodes of bowel incontinence. He has had nocturnal enuresis, which is likely secondary to treatment with Clozaril.  Patient tells me he is hearing voices that tell him to do bad things and hurt himself. He likes the hospital and does not want to be discharged yet. He was to leave on Wednesday. Parents that he does not appear to be interacting to internal stimuli. He has been pleasant and cooperative. He has been compliant with all of the medications. No aggression or agitation has been reported.  Since admission patient has not had any episodes of head banging or punching walls.  8/14 the patient has been doing much better and seems a one-to-one sitter was assigned to his care. He did not have any major events overnight. No more falls since the weekend. This morning however he is reported to be hyperverbal and he is seen laughing uncontrollably. He was asked to step  out of group due to constantly laughing. SW feel he is too disruptive for group. "Hype-up all morning" as described by his 1:1 sitter  Per nursing:  Continues to be on 1:1 no major incidents over night. No incontinence or falls   Principal Problem: Schizoaffective disorder, bipolar type ([place]) Diagnosis:   Patient Active Problem List   Diagnosis Date Noted  . Schizoaffective disorder, bipolar type ([place]) [F25.0] 10/04/2016  . Tobacco use disorder [F17.200] 09/30/2016  . 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 Tamera Punt at [facility] [phone]. He has been on 800 mg of clozaril for more than 12 months.  Confirmed with [facility] and also with [facility].  Clozaril was increased to 400mg  q day and 450 mg qhs on 6/21.    On 7/29 Brought in to hospital for syncope and hypotension ---likely secondary to increased dose of clozaril.  On 7/1 here in ER for worsening paranoia  Past Medical History:  Past Medical History:  Diagnosis Date  . Diabetes mellitus without complication ([place])   . Hypertension     Past Surgical History:  Procedure Laterality Date  . KNEE ARTHROSCOPY     Family History:  Family History  Problem Relation Age of Onset  . Diabetes Mother    Family Psychiatric  History: unknown  Social History:  History  Alcohol Use No     History  Drug Use No    Social History   Social History  . Marital status: Single    Spouse name: N/A  . Number of children: N/A  . Years of education: N/A   Social History Main Topics  . Smoking status: Current Every Day Smoker    Packs/day: 1.00    Types: Cigarettes  . Smokeless tobacco: Never Used  . Alcohol use No  . Drug use: No  . Sexual activity: No   Other Topics Concern  . None   Social History Narrative  . None    Current Medications: Current Facility-Administered Medications  Medication Dose Route Frequency Provider Last Rate Last Dose  . acetaminophen (TYLENOL) tablet 650 mg  650 mg Oral Q6H PRN [name], [name], MD   650 mg at 10/03/16 1346  . alum & mag hydroxide-simeth (MAALOX/MYLANTA) 200-200-20 MG/5ML suspension 30 mL  30 mL Oral Q4H PRN [name], John T, MD      . cholecalciferol (VITAMIN D) tablet 1,000 Units  1,000 Units Oral Daily [name], [name], MD   1,000 Units at 10/04/16 [phone]  . cloZAPine (CLOZARIL) tablet 100 mg  100 mg Oral BID Hildred Priest, MD   100 mg at 10/04/16 0826  . cloZAPine (CLOZARIL) tablet 300 mg  300 mg Oral  QHS Hildred Priest, MD   300 mg at 10/03/16 2216  . desmopressin (DDAVP) tablet 0.2 mg  0.2 mg Oral QHS [name], John T, MD   0.2 mg at 10/03/16 2214  . divalproex (DEPAKOTE ER) 24 hr tablet 1,500 mg  1,500 mg Oral QHS Hildred Priest, MD      . docusate sodium (COLACE) capsule 200 mg  200 mg Oral BID Hildred Priest, MD   200 mg at 10/04/16 0826  . lithium carbonate (LITHOBID) CR tablet 300 mg  300 mg Oral Q12H Hernandez-Gonzalez, Summer Mccolgan, MD      . magnesium hydroxide (MILK OF MAGNESIA) suspension 30 mL  30 mL Oral Daily PRN [name], [name], MD      .  metoprolol tartrate (LOPRESSOR) tablet 25 mg  25 mg Oral BID Hildred Priest, MD      . nicotine (NICODERM CQ - dosed in mg/24 hours) patch 21 mg  21 mg Transdermal Daily Hildred Priest, MD   21 mg at 10/04/16 0825  . polyethylene glycol (MIRALAX / GLYCOLAX) packet 17 g  17 g Oral Daily Hildred Priest, MD   17 g at 10/04/16 3810    Lab Results:  Results for orders placed or performed during the hospital encounter of 09/29/16 (from the past 48 hour(s))  Valproic acid level     Status: None   Collection Time: 10/04/16  6:57 AM  Result Value Ref Range   Valproic Acid Lvl 69 50.0 - 100.0 ug/mL    Blood Alcohol level:  Lab Results  Component Value Date   ETH <5 09/29/2016   ETH <5 17/51/0258    Metabolic Disorder Labs: Lab Results  Component Value Date   HGBA1C 5.4 09/30/2016   MPG 108.28 09/30/2016   MPG 114 09/17/2016   No results found for: PROLACTIN Lab Results  Component Value Date   CHOL 135 09/30/2016   TRIG 62 09/30/2016   HDL 60 09/30/2016   CHOLHDL 2.3 09/30/2016   VLDL 12 09/30/2016   LDLCALC 63 09/30/2016    Physical Findings: AIMS:  , ,  ,  ,    CIWA:    COWS:     Musculoskeletal: Strength & Muscle Tone: within normal limits Gait & Station: normal Patient leans: N/A  Psychiatric Specialty Exam: Physical Exam  Constitutional: He is  oriented to person, place, and time. He appears well-developed and well-nourished.  HENT:  Head: Normocephalic and atraumatic.  Eyes: Conjunctivae and EOM are normal.  Neck: Normal range of motion.  Respiratory: Effort normal.  Musculoskeletal: Normal range of motion.  Neurological: He is alert and oriented to person, place, and time.    Review of Systems  Constitutional: Negative.   HENT: Negative.   Eyes: Negative.   Respiratory: Negative.   Cardiovascular: Negative.   Gastrointestinal: Negative.   Genitourinary: Negative.   Musculoskeletal: Positive for falls.  Skin: Negative.   Neurological: Negative.   Endo/Heme/Allergies: Negative.   Psychiatric/Behavioral: Positive for hallucinations. Negative for depression, memory loss, substance abuse and suicidal ideas. The patient is not nervous/anxious and does not have insomnia.     Blood pressure 115/87, pulse (!) 106, temperature 98.4 F (36.9 C), temperature source Oral, resp. rate 18, height 6\' 1"  (1.854 m), weight 66.2 kg (146 lb), SpO2 100 %.Body mass index is 19.26 kg/m.  General Appearance: Fairly Groomed  Eye Contact:  Good  Speech:  Garbled  Volume:  Normal  Mood:  Euthymic  Affect:  Inappropriate laughing inappropriately   Thought Process:  Coherent and Descriptions of Associations: Intact  Orientation:  Full (Time, Place, and Person)  Thought Content:  Logical concrete  Suicidal Thoughts:  No  Homicidal Thoughts:  No  Memory:  Immediate;   Poor Recent;   Poor Remote;   Poor  Judgement:  Impaired  Insight:  Lacking  Psychomotor Activity:  Normal  Concentration:  Concentration: Poor and Attention Span: Poor  Recall:  Poor  Fund of Knowledge:  Poor  Language:  Fair  Akathisia:  No  Handed:    AIMS (if indicated):     Assets:  Armed forces logistics/support/administrative officer Social Support  ADL's:  Intact  Cognition:  Impaired,  Moderate  Sleep:  Number of Hours: 6.45     Treatment Plan Summary:  Pt with schizophrenia maintained  on high dose clozaril for >12 months. Despite compliance pt started to decompensate about 2 months ago.  Dose of clozaril was increased by outpt psychiatrist w/o benefit. June 21/18  Pt has been in our ER 3 times since then:  7/1 worsening paranoia (seen by [facility] 05 mg bid recommended)  7/29 syncope and hypotension (admitted to hospitalist service)  8/9 incontinence, disorganization and hallucinations.  Spoke with [facility] and pharmacy. Clozaril info/dose has been confirmed.  No allergies in his file per pharmacist.  Per home meds he is on multiple anticholinergics: vistaril qhs, clozaril and cogentin 2 mg bid-----potentially he could be having episodes of confusion.  Incontinence could be due to urinary retention.  Recent syncope/falls could be the result of clozaril induced orthostatic hypotension.  Plan: Will change diagnosis to Schizoaffective bipolar type: on 8/14 reported to be highly energetic, he is laughing constantly inappropriately, disruptive in group.  Likely manic symptoms---he does not seem psychotic.  -Continue clozaril 100 mg q am, 100 mg at 5 pm and 300 mg qhs  Clozaril level (total 633 on 8/10).  This level was obtained while pt was taking 850 mg of clozaril he however did not take clozaril the night before the level was drawn.  -Depakote level 64 on a dose of 500 mg po bid.  Will change to depakote er 1500 mg po qhs.  Per outpt psychiatrist pt has never been on depakote  -Despite good depakote level pt appears manic.  Will add lithium 300 mg po bid.  Per outpt psychiatrist he was on lithium prior to admission  Moderate MR: lives in a [facility], sister is guardian.  Clozaril induced tachycardia: started on metoprolol. Will increase dose to 25 mg bid as he is still tachycardic  Clozaril induced constipation: started on miralax daily and colace bid  Enuresis: continue desmopressin 0.2 mg qhs  Agitation: has orders for zyprexa 10 mg bid PO/IM prn.  Also has  orders for ativan 2 mg q 4 h prn---has not needed any prns since admission  Tobacco use: continue nicotine patch  Vit D deficiency: continue Vit d 1000 U q day  Pharmacy consult for clozaril monitoring  Labs: HbA1c 5.4.  TSH is low will need to recheck prior to discharge--- will recheck tomorrow  EKG: QTC 456  Head CT 7/28 wnl  VS: will check tid along with orthostatics  Collateral info needed from guardian and psychiatrist---spoke with pt's sister and gave her an update  F/u:  [facility]---Spoke with Dr Tamera Punt. He will call me back after reviewing pt's chart  Dispo: back to [facility] once stable--in the next 48 h to 72 h    Hildred Priest, MD 10/04/2016, 12:04 PM

## 2016-10-04 NOTE — Progress Notes (Signed)
Patient continues to be hyper verbal and laughs inappropriately.Safety maintained with sitter.

## 2016-10-04 NOTE — BHH Suicide Risk Assessment (Signed)
[place] INPATIENT:  Family/Significant Other Suicide Prevention Education  Suicide Prevention Education:  Education Completed; legal guardian, [name] ph#: [phone] has been identified by the patient as the family member/significant other with whom the patient will be residing, and identified as the person(s) who will aid the patient in the event of a mental health crisis (suicidal ideations/suicide attempt).  With written consent from the patient, the family member/significant other has been provided the following suicide prevention education, prior to the and/or following the discharge of the patient.  The suicide prevention education provided includes the following:  Suicide risk factors  Suicide prevention and interventions  National Suicide Hotline telephone number  [facility] assessment telephone number  [facility] Emergency Assistance [place] and/or Residential Mobile Crisis Unit telephone number  Request made of family/significant other to:  Remove weapons (e.g., guns, rifles, knives), all items previously/currently identified as safety concern.    Remove drugs/medications (over-the-counter, prescriptions, illicit drugs), all items previously/currently identified as a safety concern.  The family member/significant other verbalizes understanding of the suicide prevention education information provided.  The family member/significant other agrees to remove the items of safety concern listed above.  Emilie Rutter, MSW, LCSW-A 10/04/2016, 9:33 AM

## 2016-10-04 NOTE — Progress Notes (Signed)
Staff reports pt has been hyperverval and continues to laugh inappropriately.  Will order ativan 2 mg q 4 h as a prn

## 2016-10-04 NOTE — BHH Group Notes (Signed)
[facility] Group Notes:  (Nursing/MHT/Case Management/Adjunct)  Date:  10/04/2016  Time:  11:11 PM  Type of Therapy:  Group Therapy  Participation Level:  Active  Participation Quality:  Appropriate  Affect:  Appropriate  Cognitive:  Appropriate  Insight:  Appropriate  Engagement in Group:  Engaged  Modes of Intervention:  Discussion  Summary of Progress/Problems:  [name] 10/04/2016, 11:11 PM

## 2016-10-05 LAB — THYROID PANEL WITH TSH
Free Thyroxine Index: 2.6 (ref 1.2–4.9)
T3 UPTAKE RATIO: 28 % (ref 24–39)
T4, Total: 9.2 ug/dL (ref 4.5–12.0)
TSH: 0.035 u[IU]/mL — ABNORMAL LOW (ref 0.450–4.500)

## 2016-10-05 NOTE — Progress Notes (Signed)
Pt seen in dayroom with 1:1 staff Sitter 1:1, safety maintained

## 2016-10-05 NOTE — Progress Notes (Signed)
Pt calm and cooperative. He is tolerating 1:1 well. Pt still requires redirection from staff. Pt is less loud and intrusive. No issues noted at this time. He is compliant with groups and medications. Will continue to monitor.

## 2016-10-05 NOTE — Progress Notes (Signed)
Pt continues 1:1 due to being a high fall risk, pt in room resting with eyes closed, no distress noted, sitter at pt's bedside. 15 min safety checks continue. Nursing hourly checks continues.

## 2016-10-05 NOTE — Progress Notes (Signed)
Sitter 1:1, safety maintained. Pt seen at visitation with sister sitter present.

## 2016-10-05 NOTE — Progress Notes (Signed)
Sitter 1:1, safety maintained. Pt with sitter calm and cooperative

## 2016-10-05 NOTE — Progress Notes (Signed)
Sitter 1:1, safety maintained Pt seen eating lunch

## 2016-10-05 NOTE — Progress Notes (Signed)
Sitter 1:1, safety maintained. Pt in dayroom eating lunch 

## 2016-10-05 NOTE — Progress Notes (Signed)
Pt continues 1:1 due to being a high fall risk, pt in med room receiving meds, no distress noted, sitter at pt's bedside. 15 min safety checks continue. Nursing hourly checks continues.

## 2016-10-05 NOTE — Progress Notes (Addendum)
[facility] MD Progress Note  10/05/2016 9:05 AM Austin Gates  MRN:  [id number] Subjective:    56 year old man, with history of schizophrenia and moderate intellectual disability, under the guardianship of his sister, was brought to our ER on 8/9 by his caregiver with reports that he had been pounding his head against the wall.  Pt lives in a group home and has been there for about 2 years.    He admitted freely however that he had been punching his fist into the wall putting holes in the drywall and also banging his head against the wall. Patient tells me he was hearing voices and having suicidal thoughts prior to admission but he is unable to elaborate.    Approximately a month ago, the patient started "staging fake falls. He'll wait to you get close to someone or make eye contact and hold on to something, then fall on the floor and roll over." However, within the last two weeks, he started using the bathroom on his self. In the middle of the night, he would urinate "on his bed, not in it and in his closet." On 09/29/2016, he had three bowel movements on his self before 3:30pm.   He has been on 800 mg of clozaril for more than 12 months.  Confirmed with [facility] and also with [facility].  Clozaril was increased to 400mg  q day and 450 mg qhs on 6/21.    On 7/1 here in ER for worsening paranoia  On 7/29 Brought in to hospital for syncope and hypotension ---likely secondary to increased dose of clozaril.  8/11 Patient seen ambulating about the unit and he was seen this morning. Chart was reviewed electronically. Patient pleasant and cooperative. Speech is garbled and difficult to understand.  He is tangential in his speech and thinking. Cognition is impaired. Denies suicidal thoughts.  8/12  Patient seen sitting in the day room. He is not responsive to questions today. This clinician got a call at 3:30 AM saying that patient was taking a shower and had a fall but he was prevented  from having the fall by staff. Again later this morning patient appeared to have a fall, but staff reports on the cc  cameras he did not actually hit his head. Patient appears to be faking several falls for an unknown reason. We will restrict him to his room for now. He is otherwise cooperative on the unit.  8/13 patient had multiple falls this weekend. Staff believed that some of them are true syncopal episodes and some others are for attention. He has not had any other episodes of bowel incontinence. He has had nocturnal enuresis, which is likely secondary to treatment with Clozaril.  Patient tells me he is hearing voices that tell him to do bad things and hurt himself. He likes the hospital and does not want to be discharged yet. He was to leave on Wednesday. Parents that he does not appear to be interacting to internal stimuli. He has been pleasant and cooperative. He has been compliant with all of the medications. No aggression or agitation has been reported.  Since admission patient has not had any episodes of head banging or punching walls.  8/14 the patient has been doing much better and seems a one-to-one sitter was assigned to his care. He did not have any major events overnight. No more falls since the weekend. This morning however he is reported to be hyperverbal and he is seen laughing uncontrollably. He was asked to step  out of group due to constantly laughing. SW feel he is too disruptive for group. "Hype-up all morning" as described by his 1:1 sitter  8/15 yesterday he was hyperverbal, laughing uncontrollably. Over the weekend he was reported to have displayed sexual inappropriate behaviors.  Lifted pt's dress in the day room and told another pt something about his penis. Because of his laughing and talking he was asked to leave groups. Pt says he is hearing voices. Compliant with meds, denies SE, eating and sleeping well. No falls or episodes of incontinence in >48 h  Per  nursing:  Continues to be on 1:1 no major incidents over night. No incontinence or falls. Was hyperverbal and disruptive in group yesterday   Principal Problem: Schizoaffective disorder, bipolar type ([place]) Diagnosis:   Patient Active Problem List   Diagnosis Date Noted  . Schizoaffective disorder, bipolar type ([place]) [F25.0] 10/04/2016  . Tobacco use disorder [F17.200] 09/30/2016  . Moderate intellectual disability [F71] 09/30/2016   Total Time spent with patient: 30 minutes  Past Psychiatric History: f/u with Dr. Tamera Punt at [facility] [phone]. He has been on 800 mg of clozaril for more than 12 months.  Confirmed with [facility] and also with [facility].  Clozaril was increased to 400mg  q day and 450 mg qhs on 6/21.    On 7/29 Brought in to hospital for syncope and hypotension ---likely secondary to increased dose of clozaril.  On 7/1 here in ER for worsening paranoia  Past Medical History:  Past Medical History:  Diagnosis Date  . Diabetes mellitus without complication ([place])   . Hypertension     Past Surgical History:  Procedure Laterality Date  . KNEE ARTHROSCOPY     Family History:  Family History  Problem Relation Age of Onset  . Diabetes Mother    Family Psychiatric  History: unknown  Social History:  History  Alcohol Use No     History  Drug Use No    Social History   Social History  . Marital status: Single    Spouse name: N/A  . Number of children: N/A  . Years of education: N/A   Social History Main Topics  . Smoking status: Current Every Day Smoker    Packs/day: 1.00    Types: Cigarettes  . Smokeless tobacco: Never Used  . Alcohol use No  . Drug use: No  . Sexual activity: No   Other Topics Concern  . None   Social History Narrative  . None    Current Medications: Current Facility-Administered Medications  Medication Dose Route Frequency Provider Last Rate Last Dose  . acetaminophen (TYLENOL) tablet 650 mg   650 mg Oral Q6H PRN [name], [name], MD   650 mg at 10/03/16 1346  . alum & mag hydroxide-simeth (MAALOX/MYLANTA) 200-200-20 MG/5ML suspension 30 mL  30 mL Oral Q4H PRN [name], John T, MD      . cholecalciferol (VITAMIN D) tablet 1,000 Units  1,000 Units Oral Daily [name], [name], MD   1,000 Units at 10/05/16 0813  . cloZAPine (CLOZARIL) tablet 100 mg  100 mg Oral BID Hildred Priest, MD   100 mg at 10/05/16 0813  . cloZAPine (CLOZARIL) tablet 300 mg  300 mg Oral QHS Hildred Priest, MD   300 mg at 10/04/16 2123  . desmopressin (DDAVP) tablet 0.2 mg  0.2 mg Oral QHS [name], John T, MD   0.2 mg at 10/04/16 2123  . divalproex (DEPAKOTE ER) 24 hr tablet 1,500 mg  1,500  mg Oral QHS Hildred Priest, MD   1,500 mg at 10/04/16 2122  . docusate sodium (COLACE) capsule 200 mg  200 mg Oral BID Hildred Priest, MD   200 mg at 10/05/16 0813  . lithium carbonate (LITHOBID) CR tablet 300 mg  300 mg Oral Q12H Hildred Priest, MD   300 mg at 10/05/16 0813  . LORazepam (ATIVAN) tablet 2 mg  2 mg Oral Q4H PRN Hildred Priest, MD   2 mg at 10/04/16 1622  . magnesium hydroxide (MILK OF MAGNESIA) suspension 30 mL  30 mL Oral Daily PRN [name], John T, MD      . metoprolol tartrate (LOPRESSOR) tablet 12.5 mg  12.5 mg Oral BID Hildred Priest, MD   12.5 mg at 10/05/16 0813  . nicotine (NICODERM CQ - dosed in mg/24 hours) patch 21 mg  21 mg Transdermal Daily Hildred Priest, MD   21 mg at 10/05/16 0813  . polyethylene glycol (MIRALAX / GLYCOLAX) packet 17 g  17 g Oral Daily Hildred Priest, MD   17 g at 10/04/16 1610    Lab Results:  Results for orders placed or performed during the hospital encounter of 09/29/16 (from the past 48 hour(s))  Valproic acid level     Status: None   Collection Time: 10/04/16  6:57 AM  Result Value Ref Range   Valproic Acid Lvl 69 50.0 - 100.0 ug/mL  Thyroid Panel With TSH     Status:  Abnormal   Collection Time: 10/04/16  6:57 AM  Result Value Ref Range   TSH 0.035 (L) 0.450 - 4.500 uIU/mL   T4, Total 9.2 4.5 - 12.0 ug/dL   T3 Uptake Ratio 28 24 - 39 %   Free Thyroxine Index 2.6 1.2 - 4.9    Comment: (NOTE) Performed At: [facility] [address] [place], [place] [id number] Lindon Romp MD JX:[id number]     Blood Alcohol level:  Lab Results  Component Value Date   [facility] <5 09/29/2016   ETH <5 13/09/6576    Metabolic Disorder Labs: Lab Results  Component Value Date   HGBA1C 5.4 09/30/2016   MPG 108.28 09/30/2016   MPG 114 09/17/2016   No results found for: PROLACTIN Lab Results  Component Value Date   CHOL 135 09/30/2016   TRIG 62 09/30/2016   HDL 60 09/30/2016   CHOLHDL 2.3 09/30/2016   VLDL 12 09/30/2016   LDLCALC 63 09/30/2016    Physical Findings: AIMS:  , ,  ,  ,    CIWA:    COWS:     Musculoskeletal: Strength & Muscle Tone: within normal limits Gait & Station: normal Patient leans: N/A  Psychiatric Specialty Exam: Physical Exam  Constitutional: He is oriented to person, place, and time. He appears well-developed and well-nourished.  HENT:  Head: Normocephalic and atraumatic.  Eyes: Conjunctivae and EOM are normal.  Neck: Normal range of motion.  Respiratory: Effort normal.  Musculoskeletal: Normal range of motion.  Neurological: He is alert and oriented to person, place, and time.    Review of Systems  Constitutional: Negative.   HENT: Negative.   Eyes: Negative.   Respiratory: Negative.   Cardiovascular: Negative.   Gastrointestinal: Negative.   Genitourinary: Negative.   Musculoskeletal: Negative for falls.  Skin: Negative.   Neurological: Negative.   Endo/Heme/Allergies: Negative.   Psychiatric/Behavioral: Positive for hallucinations. Negative for depression, memory loss, substance abuse and suicidal ideas. The patient is not nervous/anxious and does not have insomnia.     Blood  pressure 115/84, pulse  95, temperature 97.9 F (36.6 C), temperature source Oral, resp. rate 18, height 6\' 1"  (1.854 m), weight 66.2 kg (146 lb), SpO2 99 %.Body mass index is 19.26 kg/m.  General Appearance: Fairly Groomed  Eye Contact:  Good  Speech:  Garbled  Volume:  Normal  Mood:  Euthymic  Affect:  Inappropriate laughing inappropriately   Thought Process:  Coherent and Descriptions of Associations: Intact  Orientation:  Full (Time, Place, and Person)  Thought Content:  Logical concrete  Suicidal Thoughts:  No  Homicidal Thoughts:  No  Memory:  Immediate;   Poor Recent;   Poor Remote;   Poor  Judgement:  Impaired  Insight:  Lacking  Psychomotor Activity:  Normal  Concentration:  Concentration: Poor and Attention Span: Poor  Recall:  Poor  Fund of Knowledge:  Poor  Language:  Fair  Akathisia:  No  Handed:    AIMS (if indicated):     Assets:  Armed forces logistics/support/administrative officer Social Support  ADL's:  Intact  Cognition:  Impaired,  Moderate  Sleep:  Number of Hours: 7.5     Treatment Plan Summary:  Pt with schizophrenia maintained on high dose clozaril for >12 months. Despite compliance pt started to decompensate about 2 months ago.  Dose of clozaril was increased by outpt psychiatrist w/o benefit. June 21/18  Pt has been in our ER 3 times since then:  7/1 worsening paranoia (seen by [facility] 05 mg bid recommended)  7/29 syncope and hypotension (admitted to hospitalist service)  8/9 incontinence, disorganization and hallucinations.  Spoke with [facility] and pharmacy. Clozaril info/dose has been confirmed.  No allergies in his file per pharmacist.  Per home meds he is on multiple anticholinergics: vistaril qhs, clozaril and cogentin 2 mg bid-----potentially he could be having episodes of confusion.  Incontinence could be due to urinary retention.  Recent syncope/falls could be the result of clozaril induced orthostatic hypotension.  Plan: Will change diagnosis to Schizoaffective bipolar type:  on 8/14 reported to be highly energetic, he is laughing constantly inappropriately, disruptive in group.  Likely manic symptoms---he does not seem psychotic.  -Continue clozaril 100 mg q am, 100 mg at 5 pm and 300 mg qhs  Clozaril level (total 633 on 8/10).  This level was obtained while pt was taking 850 mg of clozaril he however did not take clozaril the night before the level was drawn.  -Depakote level 64 on a dose of 500 mg po bid.  Change to depakote ER 1500 mg po qhs to simplify regimen.  Per outpt psychiatrist pt has never been on depakote  -Despite good depakote level pt appears manic.  Added lithium 300 mg po bid.  Per outpt psychiatrist he was on lithium prior to admission  Moderate MR: lives in a [facility], sister is guardian.  Clozaril induced tachycardia: started on metoprolol 12.5 mg po bid  Clozaril induced constipation: started on miralax daily and colace bid  Enuresis: continue desmopressin 0.2 mg qhs  Agitation: has orders for ativan po q 4 h prn. Received prns on Monday and Tuesday.  Tobacco use: continue nicotine patch  Vit D deficiency: continue Vit d 1000 U q day  Pharmacy consult for clozaril monitoring  Labs: HbA1c 5.4.  TSH is low but T 3 and T4 wnl---Will refer to PCP for monitoring  EKG: QTC 456  Head CT 7/28 wnl  VS: will check tid along with orthostatics--no orthostatics  Collateral info needed from guardian and psychiatrist---spoke with pt's sister and  gave her an update  F/u:  [facility]---Spoke with Dr Tamera Punt. Told be pt has never been on depakote and was on lithium recently  Dispo: back to [facility] once stable    Hildred Priest, MD 10/05/2016, 9:05 AM

## 2016-10-05 NOTE — Progress Notes (Signed)
Sitter 1:1, safety maintained Pt outside with 1:1 staff

## 2016-10-05 NOTE — Progress Notes (Signed)
Pt is on 1:1 due to being high fall risk.Pt in group room getting v/s taken, sitter is with pt. 15 min safety checks continues. Hourly nursing checks continue

## 2016-10-05 NOTE — Progress Notes (Signed)
Sitter 1:1, safety maintained. Pt is with sitter took medications at breakfast.

## 2016-10-05 NOTE — Progress Notes (Signed)
Pt in room sleeping with sitter present. Sitter 1:1, safety maintained

## 2016-10-05 NOTE — [facility] Group Notes (Signed)
[place] Group Notes:  (Nursing/MHT/Case Management/Adjunct)  Date:  10/05/2016  Time:  10:15 PM  Type of Therapy:  Group Therapy  Participation Level:  Active  Participation Quality:  Appropriate  Affect:  Appropriate  Cognitive:  Alert  Insight:  Good  Engagement in Group:  Engaged  Modes of Intervention:  Activity  Summary of Progress/Problems:  Austin Gates 10/05/2016, 10:15 PM

## 2016-10-05 NOTE — Progress Notes (Addendum)
Pt continues 1:1 due to being a high fall risk, pt in dayroom sitter with pt, no distress noted, sitter at pt's bedside. 15 min safety checks continue. Nursing hourly checks continues.

## 2016-10-05 NOTE — Plan of Care (Signed)
Problem: Safety: Goal: Ability to remain free from injury will improve Sitter 1:1, safety maintained. Pt remains safe with sitter

## 2016-10-05 NOTE — Tx Team (Signed)
Interdisciplinary Treatment and Diagnostic Plan Update  10/05/2016 Time of Session: 11:00 AM Austin Gates MRN: [id number]  Principal Diagnosis: Schizoaffective disorder, bipolar type ([place])  Secondary Diagnoses: Principal Problem:   Schizoaffective disorder, bipolar type ([place]) Active Problems:   Tobacco use disorder   Moderate intellectual disability   Current Medications:  Current Facility-Administered Medications  Medication Dose Route Frequency Provider Last Rate Last Dose  . acetaminophen (TYLENOL) tablet 650 mg  650 mg Oral Q6H PRN [name], [name], MD   650 mg at 10/03/16 1346  . alum & mag hydroxide-simeth (MAALOX/MYLANTA) 200-200-20 MG/5ML suspension 30 mL  30 mL Oral Q4H PRN [name], John T, MD      . cholecalciferol (VITAMIN D) tablet 1,000 Units  1,000 Units Oral Daily [name], [name], MD   1,000 Units at 10/05/16 0813  . cloZAPine (CLOZARIL) tablet 100 mg  100 mg Oral BID Hildred Priest, MD   100 mg at 10/05/16 0813  . cloZAPine (CLOZARIL) tablet 300 mg  300 mg Oral QHS Hildred Priest, MD   300 mg at 10/04/16 2123  . desmopressin (DDAVP) tablet 0.2 mg  0.2 mg Oral QHS [name], John T, MD   0.2 mg at 10/04/16 2123  . divalproex (DEPAKOTE ER) 24 hr tablet 1,500 mg  1,500 mg Oral QHS Hildred Priest, MD   1,500 mg at 10/04/16 2122  . docusate sodium (COLACE) capsule 200 mg  200 mg Oral BID Hildred Priest, MD   200 mg at 10/05/16 0813  . lithium carbonate (LITHOBID) CR tablet 300 mg  300 mg Oral Q12H Hildred Priest, MD   300 mg at 10/05/16 0813  . LORazepam (ATIVAN) tablet 2 mg  2 mg Oral Q4H PRN Hildred Priest, MD   2 mg at 10/04/16 1622  . magnesium hydroxide (MILK OF MAGNESIA) suspension 30 mL  30 mL Oral Daily PRN [name], John T, MD      . metoprolol tartrate (LOPRESSOR) tablet 12.5 mg  12.5 mg Oral BID Hildred Priest, MD   12.5 mg at 10/05/16 0813  . nicotine (NICODERM CQ - dosed in mg/24  hours) patch 21 mg  21 mg Transdermal Daily Hildred Priest, MD   21 mg at 10/05/16 0813  . polyethylene glycol (MIRALAX / GLYCOLAX) packet 17 g  17 g Oral Daily Hildred Priest, MD   17 g at 10/04/16 1245   PTA Medications: Prescriptions Prior to Admission  Medication Sig Dispense Refill Last Dose  . atenolol (TENORMIN) 25 MG tablet Take by mouth daily.   09/29/2016 at 0800  . benztropine (COGENTIN) 2 MG tablet Take 2 mg by mouth 2 (two) times daily.   09/29/2016 at 0800  . bismuth subsalicylate (PEPTO BISMOL) 262 MG/15ML suspension Take 30 mLs by mouth every 6 (six) hours as needed.   PRN at PRN  . Cholecalciferol (VITAMIN D-3) 5000 units TABS Take 1 tablet by mouth daily.   09/29/2016 at 0800  . cloZAPine (CLOZARIL) 100 MG tablet Take 400-450 mg by mouth See admin instructions. tk 450mg  in the morning and 400 mg at bedtime   09/29/2016 at 0800  . desmopressin (DDAVP) 0.2 MG tablet Take 0.2 mg by mouth at bedtime.   09/28/2016 at 2000  . hydrOXYzine (ATARAX/VISTARIL) 25 MG tablet Take 25 mg by mouth at bedtime.   09/28/2016 at 2000  . LORazepam (ATIVAN) 0.5 MG tablet Take 1 tablet (0.5 mg total) by mouth 2 (two) times daily as needed for anxiety. 20 tablet 0 PRN at PRN  . Melatonin  5 MG TABS Take 5 mg by mouth at bedtime as needed.   PRN at PRN  . OLANZapine (ZYPREXA) 10 MG tablet Take 10 mg by mouth daily as needed (agitation). Do not exceed a maximum of 2 tablets within 24 hours.   PRN at PRN  . traZODone (DESYREL) 150 MG tablet Take 150 mg by mouth at bedtime as needed for sleep.   PRN at PRN  . zolpidem (AMBIEN) 5 MG tablet Take 5 mg by mouth at bedtime as needed for sleep.   PRN at PRN    Patient Stressors: Other: Unsure of things  Patient Strengths: Active sense of humor Supportive family/friends  Treatment Modalities: Medication Management, Group therapy, Case management,  1 to 1 session with clinician, Psychoeducation, Recreational therapy.   Physician Treatment Plan  for Primary Diagnosis: Schizoaffective disorder, bipolar type ([place]) Long Term Goal(s): Improvement in symptoms so as ready for discharge Improvement in symptoms so as ready for discharge   Short Term Goals: Ability to demonstrate self-control will improve Ability to identify and develop effective coping behaviors will improve Ability to verbalize feelings will improve  Medication Management: Evaluate patient's response, side effects, and tolerance of medication regimen.  Therapeutic Interventions: 1 to 1 sessions, Unit Group sessions and Medication administration.  Evaluation of Outcomes: Progressing  Physician Treatment Plan for Secondary Diagnosis: Principal Problem:   Schizoaffective disorder, bipolar type ([place]) Active Problems:   Tobacco use disorder   Moderate intellectual disability  Long Term Goal(s): Improvement in symptoms so as ready for discharge Improvement in symptoms so as ready for discharge   Short Term Goals: Ability to demonstrate self-control will improve Ability to identify and develop effective coping behaviors will improve Ability to verbalize feelings will improve     Medication Management: Evaluate patient's response, side effects, and tolerance of medication regimen.  Therapeutic Interventions: 1 to 1 sessions, Unit Group sessions and Medication administration.  Evaluation of Outcomes: Progressing   RN Treatment Plan for Primary Diagnosis: Schizoaffective disorder, bipolar type ([place]) Long Term Goal(s): Knowledge of disease and therapeutic regimen to maintain health will improve  Short Term Goals: Ability to demonstrate self-control, Ability to identify and develop effective coping behaviors will improve and Compliance with prescribed medications will improve  Medication Management: RN will administer medications as ordered by provider, will assess and evaluate patient's response and provide education to patient for prescribed medication. RN will report  any adverse and/or side effects to prescribing provider.  Therapeutic Interventions: 1 on 1 counseling sessions, Psychoeducation, Medication administration, Evaluate responses to treatment, Monitor vital signs and CBGs as ordered, Perform/monitor CIWA, COWS, AIMS and Fall Risk screenings as ordered, Perform wound care treatments as ordered.  Evaluation of Outcomes: Progressing   LCSW Treatment Plan for Primary Diagnosis: Schizoaffective disorder, bipolar type ([place]) Long Term Goal(s): Safe transition to appropriate next level of care at discharge, Engage patient in therapeutic group addressing interpersonal concerns.  Short Term Goals: Engage patient in aftercare planning with referrals and resources, Increase social support, Increase emotional regulation and Increase skills for wellness and recovery  Therapeutic Interventions: Assess for all discharge needs, 1 to 1 time with Social worker, Explore available resources and support systems, Assess for adequacy in community support network, Educate family and significant other(s) on suicide prevention, Complete Psychosocial Assessment, Interpersonal group therapy.  Evaluation of Outcomes: Progressing   Progress in Treatment: Attending groups: Yes. Participating in groups: Yes. Taking medication as prescribed: Yes. Toleration medication: Yes. Family/Significant other contact made: No, will contact:  CSW  will contact pt's legal guardian  Patient understands diagnosis: Yes. Discussing patient identified problems/goals with staff: Yes. Medical problems stabilized or resolved: Yes. Denies suicidal/homicidal ideation: Yes. Issues/concerns per patient self-inventory: No.  New problem(s) identified: No, Describe:  None identified  New Short Term/Long Term Goal(s): No goal identified at this time.  Discharge Plan or Barriers: Patient will return to [facility] and follow-up with outpatient provider.  Reason for Continuation of Hospitalization:  Hallucinations Suicidal ideation  Estimated Length of Stay: 2 days   Attendees: Patient: Austin Gates 10/05/2016 10:59 AM  Physician: Dr. Merlyn Albert, MD  10/05/2016 10:59 AM  Nursing: Polly Cobia, RN  10/05/2016 10:59 AM  RN Care Manager: 10/05/2016 10:59 AM  Social Worker: Glorious Peach, MSW, LCSW-A 10/05/2016 10:59 AM  Recreational Therapist: Drue Flirt, LRT, CTRS  10/05/2016 10:59 AM  Other:  10/05/2016 10:59 AM  Other:  10/05/2016 10:59 AM  Other: 10/05/2016 10:59 AM    Scribe for Treatment Team: Emilie Rutter, LCSWA 10/05/2016 10:59 AM

## 2016-10-05 NOTE — Progress Notes (Addendum)
Sitter 1:1, safety maintained. Pt in dayroom interacting with peers

## 2016-10-05 NOTE — Progress Notes (Signed)
Sitter 1:1, safety maintained. Pt seen in hallway with sitter.

## 2016-10-06 LAB — VALPROIC ACID LEVEL: Valproic Acid Lvl: 77 ug/mL (ref 50.0–100.0)

## 2016-10-06 MED ORDER — CLOZAPINE 100 MG PO TABS: 300.0000 mg | ORAL_TABLET | Freq: Every day | ORAL | 0 refills | Status: DC

## 2016-10-06 MED ORDER — METOPROLOL TARTRATE 25 MG PO TABS: 12.5000 mg | ORAL_TABLET | Freq: Two times a day (BID) | ORAL | 0 refills | Status: DC

## 2016-10-06 MED ORDER — CLOZAPINE 100 MG PO TABS: 100.0000 mg | ORAL_TABLET | Freq: Two times a day (BID) | ORAL | 0 refills | Status: DC

## 2016-10-06 MED ORDER — DIVALPROEX SODIUM ER 500 MG PO TB24: 1500.0000 mg | ORAL_TABLET | Freq: Every day | ORAL | 0 refills | Status: DC

## 2016-10-06 MED ORDER — LITHIUM CARBONATE ER 300 MG PO TBCR: 300.0000 mg | EXTENDED_RELEASE_TABLET | Freq: Two times a day (BID) | ORAL | 0 refills | Status: DC

## 2016-10-06 NOTE — Progress Notes (Signed)
Sitter 1:1, safety maintained pt walking in hallway

## 2016-10-06 NOTE — Progress Notes (Signed)
Sitter 1:1, safety maintained pt eating lunch

## 2016-10-06 NOTE — Progress Notes (Signed)
Patient in the day room interacting with peers and remains on 1:1A, No injury, no falls.

## 2016-10-06 NOTE — Progress Notes (Signed)
Patient in bed resting, remains on 1:1A, No injury, no falls.

## 2016-10-06 NOTE — Progress Notes (Signed)
Sitter 1:1, safety maintained pt seen in dayroom

## 2016-10-06 NOTE — Progress Notes (Signed)
Out of bed to day room for VS, remains on 1:1A

## 2016-10-06 NOTE — Progress Notes (Signed)
Pt calm and cooperative he tolerates 1:1 well. Pt is in pleasant mood seen laughing at times. No aggressive behaviors noted. Will continue to monitor.

## 2016-10-06 NOTE — Progress Notes (Signed)
Patient observes in bed resting, remains on 1:1 A.

## 2016-10-06 NOTE — Progress Notes (Signed)
Pt remains safe while in hospital injury free. Pt seen in group room

## 2016-10-06 NOTE — Progress Notes (Signed)
Called pharmacy and clarified new prescriptions. Meds were read and review one by one.  Instructed pharmacy to cancel depakote DR. He will be d/c on depakote ER  All prescriptions were send electronically and received by pharmacy

## 2016-10-06 NOTE — Progress Notes (Signed)
[facility] MD Progress Note  10/06/2016 11:11 AM Austin Gates  MRN:  [id number] Subjective:    56 year old man, with history of schizophrenia and moderate intellectual disability, under the guardianship of his sister, was brought to our ER on 8/9 by his caregiver with reports that he had been pounding his head against the wall.  Pt lives in a group home and has been there for about 2 years.    He admitted freely however that he had been punching his fist into the wall putting holes in the drywall and also banging his head against the wall. Patient tells me he was hearing voices and having suicidal thoughts prior to admission but he is unable to elaborate.    Approximately a month ago, the patient started "staging fake falls. He'll wait to you get close to someone or make eye contact and hold on to something, then fall on the floor and roll over." However, within the last two weeks, he started using the bathroom on his self. In the middle of the night, he would urinate "on his bed, not in it and in his closet." On 09/29/2016, he had three bowel movements on his self before 3:30pm.   He has been on 800 mg of clozaril for more than 12 months.  Confirmed with [facility] and also with [facility].  Clozaril was increased to 400mg  q day and 450 mg qhs on 6/21.    On 7/1 here in ER for worsening paranoia  On 7/29 Brought in to hospital for syncope and hypotension ---likely secondary to increased dose of clozaril.  8/11 Patient seen ambulating about the unit and he was seen this morning. Chart was reviewed electronically. Patient pleasant and cooperative. Speech is garbled and difficult to understand.  He is tangential in his speech and thinking. Cognition is impaired. Denies suicidal thoughts.  8/12  Patient seen sitting in the day room. He is not responsive to questions today. This clinician got a call at 3:30 AM saying that patient was taking a shower and had a fall but he was prevented  from having the fall by staff. Again later this morning patient appeared to have a fall, but staff reports on the cc  cameras he did not actually hit his head. Patient appears to be faking several falls for an unknown reason. We will restrict him to his room for now. He is otherwise cooperative on the unit.  8/13 patient had multiple falls this weekend. Staff believed that some of them are true syncopal episodes and some others are for attention. He has not had any other episodes of bowel incontinence. He has had nocturnal enuresis, which is likely secondary to treatment with Clozaril.  Patient tells me he is hearing voices that tell him to do bad things and hurt himself. He likes the hospital and does not want to be discharged yet. He was to leave on Wednesday. Parents that he does not appear to be interacting to internal stimuli. He has been pleasant and cooperative. He has been compliant with all of the medications. No aggression or agitation has been reported.  Since admission patient has not had any episodes of head banging or punching walls.  8/14 the patient has been doing much better and seems a one-to-one sitter was assigned to his care. He did not have any major events overnight. No more falls since the weekend. This morning however he is reported to be hyperverbal and he is seen laughing uncontrollably. He was asked to step  out of group due to constantly laughing. SW feel he is too disruptive for group. "Hype-up all morning" as described by his 1:1 sitter  8/15 yesterday he was hyperverbal, laughing uncontrollably. Over the weekend he was reported to have displayed sexual inappropriate behaviors.  Lifted pt's dress in the day room and told another pt something about his penis. Because of his laughing and talking he was asked to leave groups. Pt says he is hearing voices. Compliant with meds, denies SE, eating and sleeping well. No falls or episodes of incontinence in >48 h  8/16 patient says he  is not longer hearing voices or having suicidal thoughts. As far as side effects he complains of feeling drowsy. He denied having any physical complaints but when I asked specifically about headaches and abdominal pain and he say yes to both. Says that he is having diarrhea. He does not appear to be in any distress or discomfort during assessment.  I spoke with the patient's guardian today who says that they came to visit last night--- guardian feels patient is much improved  Per nursing: Patient's affect is bright, happy, laughs a lot, friendly, randomly roaming from day room to his room, remains on 1:1 A for safety, medication compliant and attended the wrap-up group.  Patient in the day room interacting with peers and remains on 1:1A, No injury, no falls. Returned to his room after dinning room closed.  Pt calm and cooperative. He is tolerating 1:1 well. Pt still requires redirection from staff. Pt is less loud and intrusive. No issues noted at this time. He is compliant with groups and medications. Will continue to monitor.   Principal Problem: Schizoaffective disorder, bipolar type ([place]) Diagnosis:   Patient Active Problem List   Diagnosis Date Noted  . Schizoaffective disorder, bipolar type ([place]) [F25.0] 10/04/2016  . Tobacco use disorder [F17.200] 09/30/2016  . Moderate intellectual disability [F71] 09/30/2016   Total Time spent with patient: 30 minutes  Past Psychiatric History: f/u with Dr. Tamera Punt at [facility] [phone]. He has been on 800 mg of clozaril for more than 12 months.  Confirmed with [facility] and also with [facility].  Clozaril was increased to 400mg  q day and 450 mg qhs on 6/21.    On 7/29 Brought in to hospital for syncope and hypotension ---likely secondary to increased dose of clozaril.  On 7/1 here in ER for worsening paranoia  Past Medical History:  Past Medical History:  Diagnosis Date  . Diabetes mellitus without complication  ([place])   . Hypertension     Past Surgical History:  Procedure Laterality Date  . KNEE ARTHROSCOPY     Family History:  Family History  Problem Relation Age of Onset  . Diabetes Mother    Family Psychiatric  History: unknown  Social History:  History  Alcohol Use No     History  Drug Use No    Social History   Social History  . Marital status: Single    Spouse name: N/A  . Number of children: N/A  . Years of education: N/A   Social History Main Topics  . Smoking status: Current Every Day Smoker    Packs/day: 1.00    Types: Cigarettes  . Smokeless tobacco: Never Used  . Alcohol use No  . Drug use: No  . Sexual activity: No   Other Topics Concern  . None   Social History Narrative  . None    Current Medications: Current Facility-Administered Medications  Medication Dose  Route Frequency Provider Last Rate Last Dose  . acetaminophen (TYLENOL) tablet 650 mg  650 mg Oral Q6H PRN [name], [name], MD   650 mg at 10/03/16 1346  . alum & mag hydroxide-simeth (MAALOX/MYLANTA) 200-200-20 MG/5ML suspension 30 mL  30 mL Oral Q4H PRN [name], John T, MD      . cholecalciferol (VITAMIN D) tablet 1,000 Units  1,000 Units Oral Daily [name], [name], MD   1,000 Units at 10/06/16 0827  . cloZAPine (CLOZARIL) tablet 100 mg  100 mg Oral BID Hildred Priest, MD   100 mg at 10/06/16 0829  . cloZAPine (CLOZARIL) tablet 300 mg  300 mg Oral QHS Hildred Priest, MD   300 mg at 10/05/16 2136  . desmopressin (DDAVP) tablet 0.2 mg  0.2 mg Oral QHS [name], John T, MD   0.2 mg at 10/05/16 2137  . divalproex (DEPAKOTE ER) 24 hr tablet 1,500 mg  1,500 mg Oral QHS Hildred Priest, MD   1,500 mg at 10/05/16 2137  . docusate sodium (COLACE) capsule 200 mg  200 mg Oral BID Hildred Priest, MD   200 mg at 10/06/16 0827  . lithium carbonate (LITHOBID) CR tablet 300 mg  300 mg Oral Q12H Hildred Priest, MD   300 mg at 10/06/16 0829  . LORazepam  (ATIVAN) tablet 2 mg  2 mg Oral Q4H PRN Hildred Priest, MD   2 mg at 10/04/16 1622  . magnesium hydroxide (MILK OF MAGNESIA) suspension 30 mL  30 mL Oral Daily PRN [name], John T, MD      . metoprolol tartrate (LOPRESSOR) tablet 12.5 mg  12.5 mg Oral BID Hildred Priest, MD   12.5 mg at 10/06/16 0827  . nicotine (NICODERM CQ - dosed in mg/24 hours) patch 21 mg  21 mg Transdermal Daily Hildred Priest, MD   21 mg at 10/06/16 0829  . polyethylene glycol (MIRALAX / GLYCOLAX) packet 17 g  17 g Oral Daily Hildred Priest, MD   17 g at 10/04/16 8546    Lab Results:  No results found for this or any previous visit (from the past 48 hour(s)).  Blood Alcohol level:  Lab Results  Component Value Date   ETH <5 09/29/2016   ETH <5 27/04/5007    Metabolic Disorder Labs: Lab Results  Component Value Date   HGBA1C 5.4 09/30/2016   MPG 108.28 09/30/2016   MPG 114 09/17/2016   No results found for: PROLACTIN Lab Results  Component Value Date   CHOL 135 09/30/2016   TRIG 62 09/30/2016   HDL 60 09/30/2016   CHOLHDL 2.3 09/30/2016   VLDL 12 09/30/2016   LDLCALC 63 09/30/2016    Physical Findings: AIMS:  , ,  ,  ,    CIWA:    COWS:     Musculoskeletal: Strength & Muscle Tone: within normal limits Gait & Station: normal Patient leans: N/A  Psychiatric Specialty Exam: Physical Exam  Constitutional: He is oriented to person, place, and time. He appears well-developed and well-nourished.  HENT:  Head: Normocephalic and atraumatic.  Eyes: Conjunctivae and EOM are normal.  Neck: Normal range of motion.  Respiratory: Effort normal.  Musculoskeletal: Normal range of motion.  Neurological: He is alert and oriented to person, place, and time.    Review of Systems  Constitutional: Negative.   HENT: Negative.   Eyes: Negative.   Respiratory: Negative.   Cardiovascular: Negative.   Gastrointestinal: Negative.   Genitourinary: Negative.    Musculoskeletal: Negative.  Negative for falls.  Skin: Negative.   Neurological: Negative.   Endo/Heme/Allergies: Negative.   Psychiatric/Behavioral: Negative.  Negative for depression, memory loss, substance abuse and suicidal ideas. The patient is not nervous/anxious and does not have insomnia.     Blood pressure 106/81, pulse (!) 105, temperature 97.6 F (36.4 C), resp. rate 18, height 6\' 1"  (1.854 m), weight 66.2 kg (146 lb), SpO2 99 %.Body mass index is 19.26 kg/m.  General Appearance: Fairly Groomed  Eye Contact:  Good  Speech:  Garbled  Volume:  Normal  Mood:  Euthymic  Affect:  Inappropriate laughing inappropriately   Thought Process:  Coherent and Descriptions of Associations: Intact  Orientation:  Full (Time, Place, and Person)  Thought Content:  Logical concrete  Suicidal Thoughts:  No  Homicidal Thoughts:  No  Memory:  Immediate;   Poor Recent;   Poor Remote;   Poor  Judgement:  Impaired  Insight:  Lacking  Psychomotor Activity:  Normal  Concentration:  Concentration: Poor and Attention Span: Poor  Recall:  Poor  Fund of Knowledge:  Poor  Language:  Fair  Akathisia:  No  Handed:    AIMS (if indicated):     Assets:  Armed forces logistics/support/administrative officer Social Support  ADL's:  Intact  Cognition:  Impaired,  Moderate  Sleep:  Number of Hours: 6.45     Treatment Plan Summary:  Pt with schizophrenia maintained on high dose clozaril for >12 months. Despite compliance pt started to decompensate about 2 months ago.  Dose of clozaril was increased by outpt psychiatrist w/o benefit. June 21/18  Pt has been in our ER 3 times since then:  7/1 worsening paranoia (seen by [place] 05 mg bid recommended)  7/29 syncope and hypotension (admitted to hospitalist service)  8/9 incontinence, disorganization and hallucinations.  Spoke with [facility] and pharmacy. Clozaril info/dose has been confirmed.  No allergies in his file per pharmacist.  Per home meds he is on multiple  anticholinergics: vistaril qhs, clozaril and cogentin 2 mg bid-----potentially he could be having episodes of confusion.  Incontinence could be due to urinary retention.  Recent syncope/falls could be the result of clozaril induced orthostatic hypotension.  Plan: Will change diagnosis to Schizoaffective bipolar type: on 8/14 reported to be highly energetic, he is laughing constantly inappropriately, disruptive in group.  Likely manic symptoms---he does not seem psychotic.  -Continue clozaril 100 mg q am, 100 mg at 5 pm and 300 mg qhs  Clozaril level (total 633 on 8/10).  This level was obtained while pt was taking 850 mg of clozaril he however did not take clozaril the night before the level was drawn.  -Depakote level 64 on a dose of 500 mg po bid.  Change to depakote ER 1500 mg po qhs to simplify regimen.  Per outpt psychiatrist pt has never been on depakote  -Despite good depakote level pt appears manic.  Added lithium 300 mg po bid.  Per outpt psychiatrist he was on lithium prior to admission  Moderate MR: lives in a [facility], sister is guardian.  Clozaril induced tachycardia: started on metoprolol 12.5 mg po bid  Clozaril induced constipation: started on miralax but will change from daily to every other day.    Enuresis: continue desmopressin 0.2 mg qhs  Agitation: has orders for ativan po q 4 h prn. Received prns on Monday and Tuesday.  Tobacco use: continue nicotine patch  Vit D deficiency: continue Vit d 1000 U q day  Pharmacy consult for clozaril monitoring  Labs: HbA1c 5.4.  TSH is low but T 3 and T4 wnl---Will refer to PCP for monitoring  EKG: QTC 456  Head CT 7/28 wnl  VS: will check tid along with orthostatics--no orthostatics  Collateral info needed from guardian and psychiatrist---spoke with pt's sister and gave her an update  F/u:  [facility]---Spoke with Dr Tamera Punt. Told be pt has never been on depakote and was on lithium  recently  Dispo: back to [facility] tomorrow am  Will check CMP, Deapakote, Lithium levels and CBC with diff  Hildred Priest, MD 10/06/2016, 11:11 AM

## 2016-10-06 NOTE — Progress Notes (Signed)
Sitter 1:1, safety maintained pt eating breakfast  

## 2016-10-06 NOTE — Progress Notes (Signed)
Sitter 1:1, safety maintained. Pt seen in dayroom with sitter.

## 2016-10-06 NOTE — Progress Notes (Signed)
Sitter 1:1, safety maintained Pt receiving medication from med room

## 2016-10-06 NOTE — Progress Notes (Signed)
Patient in the day room interacting with peers and remains on 1:1A, No injury, no falls. Returned to his room after dinning room closed.

## 2016-10-06 NOTE — [facility] Group Notes (Signed)
[facility] Group Notes:  (Nursing/MHT/Case Management/Adjunct)  Date:  10/06/2016  Time:  4:06 PM  Type of Therapy:  Psychoeducational Skills  Participation Level:  Active  Participation Quality:  Redirectable and Sharing  Affect:  Appropriate  Cognitive:  Oriented  Insight:  Improving  Engagement in Group:  Engaged  Modes of Intervention:  Discussion and Education  Summary of Progress/Problems:  Charise Killian 10/06/2016, 4:06 PM

## 2016-10-06 NOTE — Plan of Care (Addendum)
Problem: Activity: Goal: Sleeping patterns will improve Patient will be able to sleep at 6 hours each night   Outcome: Progressing Patient slept for Estimated Hours of 6.45; remains on 1:1A for safety, room free of safety hazards, patient sustains no injury or falls during this shift.

## 2016-10-06 NOTE — Progress Notes (Signed)
Sitter 1:1, safety maintained pt in dayroom

## 2016-10-06 NOTE — Progress Notes (Signed)
Sitter 1:1, safety maintained pt in room

## 2016-10-06 NOTE — Progress Notes (Signed)
Patient ID: Austin Gates, male   DOB: 1960-06-28, 56 y.o.   MRN: [id number] Patient's affect is bright, happy, laughs a lot, friendly, randomly roaming from day room to his room, remains on 1:1 A for safety, medication compliant and attended the wrap-up group.

## 2016-10-06 NOTE — Progress Notes (Cosign Needed)
[facility] MD Progress Note  10/06/2016 12:31 PM Austin Gates  MRN:  [id number] Subjective:    56 year old man, with history of schizophrenia and moderate intellectual disability, under the guardianship of his sister, was brought to our ER on 8/9 by his caregiver with reports that he had been pounding his head against the wall.  Pt lives in a group home and has been there for about 2 years.    He admitted freely however that he had been punching his fist into the wall putting holes in the drywall and also banging his head against the wall. Patient tells me he was hearing voices and having suicidal thoughts prior to admission but he is unable to elaborate.    Approximately a month ago, the patient started "staging fake falls. He'll wait to you get close to someone or make eye contact and hold on to something, then fall on the floor and roll over." However, within the last two weeks, he started using the bathroom on his self. In the middle of the night, he would urinate "on his bed, not in it and in his closet." On 09/29/2016, he had three bowel movements on his self before 3:30pm.   He has been on 800 mg of clozaril for more than 12 months.  Confirmed with [facility] and also with [facility].  Clozaril was increased to 400mg  q day and 450 mg qhs on 6/21.    On 7/1 here in ER for worsening paranoia  On 7/29 Brought in to hospital for syncope and hypotension ---likely secondary to increased dose of clozaril.  8/11 Patient seen ambulating about the unit and he was seen this morning. Chart was reviewed electronically. Patient pleasant and cooperative. Speech is garbled and difficult to understand.  He is tangential in his speech and thinking. Cognition is impaired. Denies suicidal thoughts.  8/12  Patient seen sitting in the day room. He is not responsive to questions today. This clinician got a call at 3:30 AM saying that patient was taking a shower and had a fall but he was prevented  from having the fall by staff. Again later this morning patient appeared to have a fall, but staff reports on the cc  cameras he did not actually hit his head. Patient appears to be faking several falls for an unknown reason. We will restrict him to his room for now. He is otherwise cooperative on the unit.  8/13 patient had multiple falls this weekend. Staff believed that some of them are true syncopal episodes and some others are for attention. He has not had any other episodes of bowel incontinence. He has had nocturnal enuresis, which is likely secondary to treatment with Clozaril.  Patient tells me he is hearing voices that tell him to do bad things and hurt himself. He likes the hospital and does not want to be discharged yet. He was to leave on Wednesday. Parents that he does not appear to be interacting to internal stimuli. He has been pleasant and cooperative. He has been compliant with all of the medications. No aggression or agitation has been reported.  Since admission patient has not had any episodes of head banging or punching walls.  8/14 the patient has been doing much better and seems a one-to-one sitter was assigned to his care. He did not have any major events overnight. No more falls since the weekend. This morning however he is reported to be hyperverbal and he is seen laughing uncontrollably. He was asked to step  out of group due to constantly laughing. SW feel he is too disruptive for group. "Hype-up all morning" as described by his 1:1 sitter  8/15 yesterday he was hyperverbal, laughing uncontrollably. Over the weekend he was reported to have displayed sexual inappropriate behaviors.  Lifted pt's dress in the day room and told another pt something about his penis. Because of his laughing and talking he was asked to leave groups. Pt says he is hearing voices. Compliant with meds, denies SE, eating and sleeping well. No falls or episodes of incontinence in >48 h  8/16 Patient is no  longer hearing voices and denies side effects from medications. Denies SI/HI/Hallucinations. Denies falls or episodes of incontinence in >48 hours, although he does report feeling some abdominal discomfort with diarrhea. He has a HA currently. He reports feeling a little drowsy. Compliant with medications, eating and sleeping well.   Per nursing:  Pt is on 1:1 due to being high fall risk.Pt in group room getting v/s taken, sitter is with pt. 15 min safety checks continues. Hourly nursing checks continue    Principal Problem: Schizoaffective disorder, bipolar type [facility]) Diagnosis:   Patient Active Problem List   Diagnosis Date Noted  . Schizoaffective disorder, bipolar type ([place]) [F25.0] 10/04/2016  . Tobacco use disorder [F17.200] 09/30/2016  . Moderate intellectual disability [F71] 09/30/2016   Total Time spent with patient: 30 minutes  Past Psychiatric History: f/u with Dr. Tamera Punt at [facility] [phone]. He has been on 800 mg of clozaril for more than 12 months.  Confirmed with [facility] and also with [facility].  Clozaril was increased to 400mg  q day and 450 mg qhs on 6/21.    On 7/29 Brought in to hospital for syncope and hypotension ---likely secondary to increased dose of clozaril.  On 7/1 here in ER for worsening paranoia  Past Medical History:  Past Medical History:  Diagnosis Date  . Diabetes mellitus without complication ([place])   . Hypertension     Past Surgical History:  Procedure Laterality Date  . KNEE ARTHROSCOPY     Family History:  Family History  Problem Relation Age of Onset  . Diabetes Mother    Family Psychiatric  History: unknown  Social History:  History  Alcohol Use No     History  Drug Use No    Social History   Social History  . Marital status: Single    Spouse name: N/A  . Number of children: N/A  . Years of education: N/A   Social History Main Topics  . Smoking status: Current Every Day Smoker     Packs/day: 1.00    Types: Cigarettes  . Smokeless tobacco: Never Used  . Alcohol use No  . Drug use: No  . Sexual activity: No   Other Topics Concern  . None   Social History Narrative  . None    Current Medications: Current Facility-Administered Medications  Medication Dose Route Frequency Provider Last Rate Last Dose  . acetaminophen (TYLENOL) tablet 650 mg  650 mg Oral Q6H PRN [name], [name], MD   650 mg at 10/03/16 1346  . alum & mag hydroxide-simeth (MAALOX/MYLANTA) 200-200-20 MG/5ML suspension 30 mL  30 mL Oral Q4H PRN [name], John T, MD      . cholecalciferol (VITAMIN D) tablet 1,000 Units  1,000 Units Oral Daily [name], [name], MD   1,000 Units at 10/06/16 0827  . cloZAPine (CLOZARIL) tablet 100 mg  100 mg Oral BID Hildred Priest, MD  100 mg at 10/06/16 0829  . cloZAPine (CLOZARIL) tablet 300 mg  300 mg Oral QHS Hildred Priest, MD   300 mg at 10/05/16 2136  . desmopressin (DDAVP) tablet 0.2 mg  0.2 mg Oral QHS [name], John T, MD   0.2 mg at 10/05/16 2137  . divalproex (DEPAKOTE ER) 24 hr tablet 1,500 mg  1,500 mg Oral QHS Hildred Priest, MD   1,500 mg at 10/05/16 2137  . docusate sodium (COLACE) capsule 200 mg  200 mg Oral BID Hildred Priest, MD   200 mg at 10/06/16 0827  . lithium carbonate (LITHOBID) CR tablet 300 mg  300 mg Oral Q12H Hildred Priest, MD   300 mg at 10/06/16 0829  . LORazepam (ATIVAN) tablet 2 mg  2 mg Oral Q4H PRN Hildred Priest, MD   2 mg at 10/04/16 1622  . magnesium hydroxide (MILK OF MAGNESIA) suspension 30 mL  30 mL Oral Daily PRN [name], John T, MD      . metoprolol tartrate (LOPRESSOR) tablet 12.5 mg  12.5 mg Oral BID Hildred Priest, MD   12.5 mg at 10/06/16 0827  . nicotine (NICODERM CQ - dosed in mg/24 hours) patch 21 mg  21 mg Transdermal Daily Hildred Priest, MD   21 mg at 10/06/16 0829  . polyethylene glycol (MIRALAX / GLYCOLAX) packet 17 g  17 g  Oral Daily Hildred Priest, MD   17 g at 10/04/16 6294    Lab Results:  No results found for this or any previous visit (from the past 48 hour(s)).  Blood Alcohol level:  Lab Results  Component Value Date   ETH <5 09/29/2016   ETH <5 76/54/6503    Metabolic Disorder Labs: Lab Results  Component Value Date   HGBA1C 5.4 09/30/2016   MPG 108.28 09/30/2016   MPG 114 09/17/2016   No results found for: PROLACTIN Lab Results  Component Value Date   CHOL 135 09/30/2016   TRIG 62 09/30/2016   HDL 60 09/30/2016   CHOLHDL 2.3 09/30/2016   VLDL 12 09/30/2016   LDLCALC 63 09/30/2016    Physical Findings: AIMS:  , ,  ,  ,    CIWA:    COWS:     Musculoskeletal: Strength & Muscle Tone: within normal limits Gait & Station: normal Patient leans: N/A  Psychiatric Specialty Exam: Physical Exam  Constitutional: He is oriented to person, place, and time. He appears well-developed and well-nourished.  HENT:  Head: Normocephalic and atraumatic.  Eyes: Conjunctivae and EOM are normal.  Neck: Normal range of motion.  Respiratory: Effort normal.  Musculoskeletal: Normal range of motion.  Neurological: He is alert and oriented to person, place, and time.    Review of Systems  Constitutional: Negative.   HENT: Negative.   Eyes: Negative.   Respiratory: Negative.   Cardiovascular: Negative.   Gastrointestinal: Negative.   Genitourinary: Negative.   Musculoskeletal: Negative for falls.  Skin: Negative.   Neurological: Negative.   Endo/Heme/Allergies: Negative.   Psychiatric/Behavioral: Positive for hallucinations. Negative for depression, memory loss, substance abuse and suicidal ideas. The patient is not nervous/anxious and does not have insomnia.     Blood pressure 106/81, pulse (!) 105, temperature 97.6 F (36.4 C), resp. rate 18, height 6\' 1"  (1.854 m), weight 66.2 kg (146 lb), SpO2 99 %.Body mass index is 19.26 kg/m.  General Appearance: Fairly Groomed  Eye  Contact:  Good  Speech:  Garbled  Volume:  Normal  Mood:  Euthymic  Affect:  Inappropriate laughing  inappropriately   Thought Process:  Coherent and Descriptions of Associations: Intact  Orientation:  Full (Time, Place, and Person)  Thought Content:  Logical concrete  Suicidal Thoughts:  No  Homicidal Thoughts:  No  Memory:  Immediate;   Poor Recent;   Poor Remote;   Poor  Judgement:  Impaired  Insight:  Lacking  Psychomotor Activity:  Normal  Concentration:  Concentration: Poor and Attention Span: Poor  Recall:  Poor  Fund of Knowledge:  Poor  Language:  Fair  Akathisia:  No  Handed:    AIMS (if indicated):     Assets:  Armed forces logistics/support/administrative officer Social Support  ADL's:  Intact  Cognition:  Impaired,  Moderate  Sleep:  Number of Hours: 6.45     Treatment Plan Summary:  Pt with schizophrenia maintained on high dose clozaril for >12 months. Despite compliance pt started to decompensate about 2 months ago.  Dose of clozaril was increased by outpt psychiatrist w/o benefit. June 21/18  Pt has been in our ER 3 times since then:  7/1 worsening paranoia (seen by [place] 05 mg bid recommended)  7/29 syncope and hypotension (admitted to hospitalist service)  8/9 incontinence, disorganization and hallucinations.  Spoke with [facility] and pharmacy. Clozaril info/dose has been confirmed.  No allergies in his file per pharmacist.  Per home meds he is on multiple anticholinergics: vistaril qhs, clozaril and cogentin 2 mg bid-----potentially he could be having episodes of confusion.  Incontinence could be due to urinary retention.  Recent syncope/falls could be the result of clozaril induced orthostatic hypotension.  Plan: Will change diagnosis to Schizoaffective bipolar type: on 8/14 reported to be highly energetic, he is laughing constantly inappropriately, disruptive in group.  Likely manic symptoms---he does not seem psychotic.  -Continue clozaril 100 mg q am, 100 mg at 5 pm  and 300 mg qhs  Clozaril level (total 633 on 8/10).  This level was obtained while pt was taking 850 mg of clozaril he however did not take clozaril the night before the level was drawn.  -Depakote level 64 on a dose of 500 mg po bid.  Change to depakote ER 1500 mg po qhs to simplify regimen.  Per outpt psychiatrist pt has never been on depakote  -Despite good depakote level pt appears manic.  Added lithium 300 mg po bid.  Per outpt psychiatrist he was on lithium prior to admission  Moderate MR: lives in a [facility], sister is guardian.  Clozaril induced tachycardia: started on metoprolol 12.5 mg po bid  Clozaril induced constipation: started on miralax daily and colace bid  Enuresis: continue desmopressin 0.2 mg qhs  Agitation: has orders for ativan po q 4 h prn. Received prns on Monday and Tuesday.  Tobacco use: continue nicotine patch  Vit D deficiency: continue Vit d 1000 U q day  Pharmacy consult for clozaril monitoring  Labs: HbA1c 5.4.  TSH is low but T 3 and T4 wnl---Will refer to PCP for monitoring  EKG: QTC 456  Head CT 7/28 wnl  VS: will check TID along with orthostatics--no orthostatics  Collateral info needed from guardian and psychiatrist---spoke with pt's sister and gave her an update  F/u:  [facility]---Spoke with Dr Tamera Punt. Told be pt has never been on depakote and was on lithium recently  Dispo: back to [facility] once stable tomorrow 8/17 morning.    Eli Hose, Student-PA 10/06/2016, 12:31 PM

## 2016-10-07 LAB — COMPREHENSIVE METABOLIC PANEL
ALK PHOS: 77 U/L (ref 38–126)
ALT: 17 U/L (ref 17–63)
ANION GAP: 7 (ref 5–15)
AST: 17 U/L (ref 15–41)
Albumin: 3.9 g/dL (ref 3.5–5.0)
BUN: 14 mg/dL (ref 6–20)
CALCIUM: 9.7 mg/dL (ref 8.9–10.3)
CO2: 29 mmol/L (ref 22–32)
CREATININE: 1.04 mg/dL (ref 0.61–1.24)
Chloride: 99 mmol/L — ABNORMAL LOW (ref 101–111)
Glucose, Bld: 117 mg/dL — ABNORMAL HIGH (ref 65–99)
Potassium: 4.6 mmol/L (ref 3.5–5.1)
Sodium: 135 mmol/L (ref 135–145)
TOTAL PROTEIN: 7.1 g/dL (ref 6.5–8.1)
Total Bilirubin: 0.5 mg/dL (ref 0.3–1.2)

## 2016-10-07 LAB — LITHIUM LEVEL: Lithium Lvl: 0.42 mmol/L — ABNORMAL LOW (ref 0.60–1.20)

## 2016-10-07 LAB — CBC WITH DIFFERENTIAL/PLATELET
Basophils Absolute: 0 10*3/uL (ref 0–0.1)
Basophils Relative: 1 %
EOS ABS: 0.2 10*3/uL (ref 0–0.7)
EOS PCT: 5 %
HCT: 40.7 % (ref 40.0–52.0)
HEMOGLOBIN: 13.6 g/dL (ref 13.0–18.0)
LYMPHS ABS: 1 10*3/uL (ref 1.0–3.6)
LYMPHS PCT: 22 %
MCH: 29.7 pg (ref 26.0–34.0)
MCHC: 33.5 g/dL (ref 32.0–36.0)
MCV: 88.5 fL (ref 80.0–100.0)
MONOS PCT: 6 %
Monocytes Absolute: 0.3 10*3/uL (ref 0.2–1.0)
Neutro Abs: 3.1 10*3/uL (ref 1.4–6.5)
Neutrophils Relative %: 66 %
Platelets: 199 10*3/uL (ref 150–440)
RBC: 4.6 MIL/uL (ref 4.40–5.90)
RDW: 13.9 % (ref 11.5–14.5)
WBC: 4.7 10*3/uL (ref 3.8–10.6)

## 2016-10-07 NOTE — Progress Notes (Signed)
Patient in dayroom eating breakfast in no distress. Remains with 1:1 sitter for falls safety.

## 2016-10-07 NOTE — Progress Notes (Signed)
Patient in bathroom at present in no distress. Remains 1:1 sitter for falls.

## 2016-10-07 NOTE — Progress Notes (Signed)
Observed resting in bed with eyes closed, remains on 1:1A

## 2016-10-07 NOTE — B[facility] Group Notes (Signed)
[facility] Group Notes:  (Nursing/MHT/Case Management/Adjunct)  Date:  10/07/2016  Time:  5:17 AM  Type of Therapy:  Psychoeducational Skills  Participation Level:  Active  Participation Quality:  Appropriate  Affect:  Appropriate  Cognitive:  Appropriate  Insight:  Appropriate  Engagement in Group:  Engaged  Modes of Intervention:  Discussion, Socialization and Support  Summary of Progress/Problems:  Austin Gates 10/07/2016, 5:17 AM

## 2016-10-07 NOTE — NC FL2 (Signed)
[place] LEVEL OF CARE SCREENING TOOL     IDENTIFICATION  Patient Name: Austin Gates Birthdate: 12-Sep-1960 Sex: male Admission Date (Current Location): 09/29/2016  [place] and [place] Number:  Engineering geologist and Address:  [facility], [address] [place], [place] [address]      Provider Number: [id number]  Attending Physician Name and Address:  Jeoffrey Massed*  Relative Name and Phone Number:       Current Level of Care: Hospital Recommended Level of Care: [place] Prior Approval Number:    Date Approved/Denied:   PASRR Number:    Discharge Plan: Domiciliary (Rest home)    Current Diagnoses: Patient Active Problem List   Diagnosis Date Noted  . Schizoaffective disorder, bipolar type ([place]) 10/04/2016  . Tobacco use disorder 09/30/2016  . Moderate intellectual disability 09/30/2016    Orientation RESPIRATION BLADDER Height & Weight     Self, Situation, Place  Normal Continent Weight: 146 lb (66.2 kg) Height:  6\' 1"  (185.4 cm)  BEHAVIORAL SYMPTOMS/MOOD NEUROLOGICAL BOWEL NUTRITION STATUS     (N/A) Continent    AMBULATORY STATUS COMMUNICATION OF NEEDS Skin   Independent Verbally Normal                       Personal Care Assistance Level of Assistance  Bathing, Feeding, Dressing Bathing Assistance: Independent Feeding assistance: Independent Dressing Assistance: Independent     Functional Limitations Info   (N/A)          SPECIAL CARE FACTORS FREQUENCY                       Contractures Contractures Info: Not present    Additional Factors Info  Code Status, Allergies Code Status Info: Full Allergies Info: NKA           Current Medications (10/07/2016):  This is the current hospital active medication list Current Facility-Administered Medications  Medication Dose Route Frequency Provider Last Rate Last Dose  . acetaminophen (TYLENOL) tablet 650 mg  650 mg Oral Q6H  PRN [name], [name], MD   650 mg at 10/03/16 1346  . alum & mag hydroxide-simeth (MAALOX/MYLANTA) 200-200-20 MG/5ML suspension 30 mL  30 mL Oral Q4H PRN [name], John T, MD      . cholecalciferol (VITAMIN D) tablet 1,000 Units  1,000 Units Oral Daily [name], [name], MD   1,000 Units at 10/07/16 [phone]  . cloZAPine (CLOZARIL) tablet 100 mg  100 mg Oral BID Hildred Priest, MD   100 mg at 10/07/16 0806  . cloZAPine (CLOZARIL) tablet 300 mg  300 mg Oral QHS Hildred Priest, MD   300 mg at 10/06/16 2124  . desmopressin (DDAVP) tablet 0.2 mg  0.2 mg Oral QHS [name], John T, MD   0.2 mg at 10/06/16 2124  . divalproex (DEPAKOTE ER) 24 hr tablet 1,500 mg  1,500 mg Oral QHS Hildred Priest, MD   1,500 mg at 10/06/16 2123  . docusate sodium (COLACE) capsule 200 mg  200 mg Oral BID Hildred Priest, MD   200 mg at 10/07/16 0805  . lithium carbonate (LITHOBID) CR tablet 300 mg  300 mg Oral Q12H Hildred Priest, MD   300 mg at 10/07/16 0806  . LORazepam (ATIVAN) tablet 2 mg  2 mg Oral Q4H PRN Hildred Priest, MD   2 mg at 10/04/16 1622  . magnesium hydroxide (MILK OF MAGNESIA) suspension 30 mL  30 mL Oral Daily PRN  [name], [name], MD      . metoprolol tartrate (LOPRESSOR) tablet 12.5 mg  12.5 mg Oral BID Hildred Priest, MD   12.5 mg at 10/07/16 0806  . nicotine (NICODERM CQ - dosed in mg/24 hours) patch 21 mg  21 mg Transdermal Daily Hildred Priest, MD   21 mg at 10/07/16 0806  . polyethylene glycol (MIRALAX / GLYCOLAX) packet 17 g  17 g Oral Daily Hildred Priest, MD   17 g at 10/07/16 0806     Discharge Medications: Please see discharge summary for a list of discharge medications.  Relevant Imaging Results:  Relevant Lab Results:   Additional Information SS# [id number]  Emilie Rutter, LCSWA

## 2016-10-07 NOTE — Progress Notes (Signed)
Patient discharged home. DC instructions provided and explained. Medications reviewed Rx given as well as FL2, AVS  Risk assessment and transition. Denies SI, HI, AVH. All questions answered. Pt stable at discharge.

## 2016-10-07 NOTE — Discharge Instructions (Signed)
Please schedule a follow up with patient's primary care doctor in the next 2-3 weeks for follow up of low TSH and tachycardia

## 2016-10-07 NOTE — Progress Notes (Signed)
In the Day Room interacting well with peers, remains on 1:1A.

## 2016-10-07 NOTE — Progress Notes (Signed)
Patient ID: Austin Gates, male   DOB: 1960/05/23, 56 y.o.   MRN: [id number] Happy mood and affect, enjoys the company of 1:1 A, denied pain, medication compliant, no behavioral problems.

## 2016-10-07 NOTE — Plan of Care (Signed)
Problem: Activity: Goal: Sleeping patterns will improve Patient will be able to sleep at 6 hours each night   Outcome: Progressing Patient slept for Estimated Hours of 6.45; 1:1A for safety maintained, room free of safety hazards, patient sustains no injury or falls during this shift.

## 2016-10-07 NOTE — Progress Notes (Signed)
  [facility] Adult Case Management Discharge Plan :  Will you be returning to the same living situation after discharge:  Yes,  returning to group home. At discharge, do you have transportation home?: Yes,  group home staff. Do you have the ability to pay for your medications: Yes,  Medicaid.  Release of information consent forms completed and in the chart;  Patient's signature needed at discharge.  Patient to Follow up at: Follow-up Information    [facility]. Go on 10/12/2016.   Why:  Please attend your medication appointment with Dr. Arn Medal on Friday, 10/12/16, at 10:20am.  Please bring a copy of your hospital discharge paperwork. Contact information: [place], [place] [address] P:  [phone] F: [phone]          Next level of care provider has access to [place] and Suicide Prevention discussed: Yes,  SPE completed with legal guardian.  Have you used any form of tobacco in the last 30 days? (Cigarettes, Smokeless Tobacco, Cigars, and/or Pipes): Yes  Has patient been referred to the Quitline?: Patient refused referral  Patient has been referred for addiction treatment: [place], [place] 10/07/2016, 10:09 AM

## 2016-10-07 NOTE — Progress Notes (Signed)
Patient returned to his room, resting in bed with eyes closed, remains on 1:1A

## 2016-10-07 NOTE — Progress Notes (Signed)
Up and in the Day Room for Vital Signs, remains on 1:1A

## 2016-10-07 NOTE — Progress Notes (Signed)
Patient denies SI, HI, AVH. In room resting eyes closed. Pt eating meals well, socializing with staff and peers in no distress

## 2016-10-07 NOTE — Progress Notes (Signed)
Patient in room, awake in bed, remains on 1:1A

## 2016-10-07 NOTE — Progress Notes (Signed)
Observed in bed resting, remains on 1:1A

## 2016-12-21 ENCOUNTER — Emergency Department (HOSPITAL_COMMUNITY)
Admission: EM | Admit: 2016-12-21 | Discharge: 2016-12-21 | Disposition: A | Discharge status: Home / Self Care | Payer: Medicaid Other | Attending: Emergency Medicine | Admitting: Emergency Medicine

## 2016-12-21 ENCOUNTER — Encounter (HOSPITAL_COMMUNITY): Payer: Self-pay | Admitting: Emergency Medicine

## 2016-12-21 DIAGNOSIS — E119 Type 2 diabetes mellitus without complications: Secondary | ICD-10-CM | POA: Diagnosis not present

## 2016-12-21 DIAGNOSIS — Z79899 Other long term (current) drug therapy: Secondary | ICD-10-CM | POA: Diagnosis not present

## 2016-12-21 DIAGNOSIS — F17210 Nicotine dependence, cigarettes, uncomplicated: Secondary | ICD-10-CM | POA: Insufficient documentation

## 2016-12-21 DIAGNOSIS — Y939 Activity, unspecified: Secondary | ICD-10-CM | POA: Insufficient documentation

## 2016-12-21 DIAGNOSIS — Y999 Unspecified external cause status: Secondary | ICD-10-CM | POA: Insufficient documentation

## 2016-12-21 DIAGNOSIS — Y929 Unspecified place or not applicable: Secondary | ICD-10-CM | POA: Diagnosis not present

## 2016-12-21 DIAGNOSIS — I10 Essential (primary) hypertension: Secondary | ICD-10-CM | POA: Diagnosis not present

## 2016-12-21 DIAGNOSIS — R519 Headache, unspecified: Secondary | ICD-10-CM

## 2016-12-21 DIAGNOSIS — R51 Headache: Secondary | ICD-10-CM | POA: Diagnosis not present

## 2016-12-21 MED ORDER — ACETAMINOPHEN 500 MG PO TABS
1000.0000 mg | ORAL_TABLET | Freq: Once | ORAL | Status: AC
  Administered 2016-12-21: 1000 mg via ORAL
  Filled 2016-12-21: qty 2

## 2016-12-21 NOTE — ED Notes (Signed)
Patient verbalized understanding of discharge instructions and denies any further needs or questions at this time. VS stable. Patient ambulatory with steady gait.  

## 2016-12-21 NOTE — Discharge Instructions (Signed)
Alternate 600 mg of ibuprofen and 500-1000 mg of Tylenol every 3 hours as needed for pain. Do not exceed 4000 mg of Tylenol daily. Ice to areas of soreness for the next few days and then may move to heat. Do some gentle stretching throughout the day, especially during hot showers or baths. Take short frequent walks and avoid prolonged periods of sitting or laying. Expect to be sore for the next few day and follow up with primary care physician for recheck of ongoing symptoms but return to ER for emergent changing or worsening of symptoms such as severe headache that gets worse, altered mental status/behaving unusually, persistent vomiting, excessive drowsiness, numbness to the arms or legs, unsteady gait, or slurred speech. ° °
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
## 2017-10-13 ENCOUNTER — Emergency Department: Payer: Medicaid Other

## 2017-10-13 ENCOUNTER — Emergency Department
Admission: EM | Admit: 2017-10-13 | Discharge: 2017-10-13 | Disposition: A | Discharge status: Home / Self Care | Payer: Medicaid Other | Attending: Emergency Medicine | Admitting: Emergency Medicine

## 2017-10-13 ENCOUNTER — Other Ambulatory Visit: Payer: Self-pay

## 2017-10-13 DIAGNOSIS — E119 Type 2 diabetes mellitus without complications: Secondary | ICD-10-CM | POA: Diagnosis not present

## 2017-10-13 DIAGNOSIS — M25561 Pain in right knee: Secondary | ICD-10-CM | POA: Diagnosis not present

## 2017-10-13 DIAGNOSIS — F17210 Nicotine dependence, cigarettes, uncomplicated: Secondary | ICD-10-CM | POA: Diagnosis not present

## 2017-10-13 DIAGNOSIS — I10 Essential (primary) hypertension: Secondary | ICD-10-CM | POA: Insufficient documentation

## 2017-10-13 MED ORDER — CEPHALEXIN 500 MG PO CAPS: 500.0000 mg | ORAL_CAPSULE | Freq: Four times a day (QID) | ORAL | 0 refills | Status: AC

## 2017-10-13 MED ORDER — ACETAMINOPHEN 500 MG PO TABS
1000.0000 mg | ORAL_TABLET | Freq: Once | ORAL | Status: AC
  Administered 2017-10-13: 1000 mg via ORAL
  Filled 2017-10-13: qty 2

## 2017-10-13 MED ORDER — IBUPROFEN 600 MG PO TABS: 600.0000 mg | ORAL_TABLET | Freq: Three times a day (TID) | ORAL | 0 refills | Status: DC | PRN

## 2017-10-13 NOTE — ED Notes (Signed)
See triage note  States he fell "a while " back  conts to have pain and swelling to right knee   No deformity noted

## 2017-10-13 NOTE — ED Provider Notes (Signed)
[facility] Emergency Department Provider Note       Time seen: ----------------------------------------- 10:18 AM on 10/13/2017 -----------------------------------------   I have reviewed the triage vital signs and the nursing notes.  HISTORY   Chief Complaint Knee Pain    HPI Austin Gates is a 57 y.o. male with a history of diabetes and hypertension who presents to the ED for pain to the right knee.  Patient reports he fell [place] days ago and is having right knee pain.  He is here with [facility] providers.  He does not have any deformity of the knee, can walk on the knee but is complaining of persistent pain.  Has not had fevers.  He has a history of frequent falls.  Past Medical History:  Diagnosis Date  . Diabetes mellitus without complication ([place])   . Hypertension     Patient Active Problem List   Diagnosis Date Noted  . Schizoaffective disorder, bipolar type ([place]) 10/04/2016  . Tobacco use disorder 09/30/2016  . 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 KNEE ARTHROSCOPY      Allergies Patient has no known allergies.  Social History Social History   Tobacco Use  . Smoking status: Current Every Day Smoker    Packs/day: 1.00    Types: Cigarettes  . Smokeless tobacco: Never Used  Substance Use Topics  . Alcohol use: No  . Drug use: No   Review of Systems Constitutional: Negative for fever. Musculoskeletal: Positive for right knee pain and swelling Skin: Positive for right knee swelling Neurological: Negative for headaches, focal weakness or numbness.  All systems negative/normal/unremarkable except as stated in the HPI  ____________________________________________   PHYSICAL EXAM:  VITAL SIGNS: ED Triage Vitals  Enc Vitals Group     BP 10/13/17 0912 97/76     Pulse Rate 10/13/17 0912 (!) 104     Resp 10/13/17 0912 18     Temp --     Temp src --      SpO2 10/13/17 0912 99 %     Weight 10/13/17 0913 160 lb (72.6 kg)     Height 10/13/17 0913 6\' 1"  (1.854 m)     Head Circumference --      Peak Flow --      Pain Score 10/13/17 0913 10     Pain Loc --      Pain Edu? --      Excl. in [place]? --    Constitutional: No acute distress ENT   Head: Normocephalic and atraumatic.   Nose: No congestion/rhinnorhea.   Mouth/Throat: Mucous membranes are moist.   Neck: No stridor. Musculoskeletal: There is some tenderness over the right knee with obvious swelling noted and mild warmth.  There is no knee effusion and he has unremarkable range of motion of the knee at this time.  There appears to be swelling and tenderness over the prepatellar bursa and infrapatellar bursa with possible bursitis.  There is also some skin lesions and around the knee which may be a source for infection. Neurologic:  Normal speech and language. No gross focal neurologic deficits are appreciated.  Skin: Mild erythema and swelling to the right knee ____________________________________________  ED COURSE:  As part of my medical decision making, I reviewed the following data within the [facility] History obtained from family if available, nursing notes, old chart and ekg, as well as notes from prior ED visits. Patient  presented for right knee pain and swelling after recent fall, we will assess with labs and imaging as indicated at this time.   Procedures ____________________________________________   RADIOLOGY Images were viewed by me Right knee x-rays IMPRESSION: Mild degenerative change and soft tissue swelling without acute bony abnormality.   ____________________________________________  DIFFERENTIAL DIAGNOSIS   Bursitis, contusion, cellulitis, fracture, septic arthritis unlikely  FINAL ASSESSMENT AND PLAN  Right knee pain   Plan: The patient had presented for right knee pain which appears to be superficial  and involving one or more bursa.  There is also the complicating factor of recent fall.  In addition to these he has skin lesions which could be a source for infection.  There is no significant joint effusion.  Patient will be given topical antibiotic ointment and oral antibiotics as well as anti-inflammatory medicine for pain.  He will be referred to orthopedics for outpatient follow-up.   Laurence Aly, MD   Note: This note was generated in part or whole with voice recognition software. Voice recognition is usually quite accurate but there are transcription errors that can and very often do occur. I apologize for any typographical errors that were not detected and corrected.     Earleen Newport, MD 10/13/17 1022

## 2017-10-13 NOTE — ED Triage Notes (Signed)
Pt reports that he fell a while ago and is having rt knee pain, pt has some swelling noted to the right knee. Pt is here with [facility] providers.

## 2017-11-06 ENCOUNTER — Encounter: Payer: Self-pay | Admitting: Emergency Medicine

## 2017-11-06 ENCOUNTER — Other Ambulatory Visit: Payer: Self-pay

## 2017-11-06 ENCOUNTER — Emergency Department
Admission: EM | Admit: 2017-11-06 | Discharge: 2017-11-06 | Disposition: A | Discharge status: Home / Self Care | Payer: Medicaid Other | Attending: Emergency Medicine | Admitting: Emergency Medicine

## 2017-11-06 ENCOUNTER — Emergency Department: Payer: Medicaid Other

## 2017-11-06 DIAGNOSIS — Y9389 Activity, other specified: Secondary | ICD-10-CM | POA: Insufficient documentation

## 2017-11-06 DIAGNOSIS — E860 Dehydration: Secondary | ICD-10-CM | POA: Insufficient documentation

## 2017-11-06 DIAGNOSIS — Y998 Other external cause status: Secondary | ICD-10-CM | POA: Diagnosis not present

## 2017-11-06 DIAGNOSIS — I10 Essential (primary) hypertension: Secondary | ICD-10-CM | POA: Diagnosis not present

## 2017-11-06 DIAGNOSIS — E119 Type 2 diabetes mellitus without complications: Secondary | ICD-10-CM | POA: Diagnosis not present

## 2017-11-06 DIAGNOSIS — F17210 Nicotine dependence, cigarettes, uncomplicated: Secondary | ICD-10-CM | POA: Diagnosis not present

## 2017-11-06 DIAGNOSIS — F79 Unspecified intellectual disabilities: Secondary | ICD-10-CM | POA: Insufficient documentation

## 2017-11-06 DIAGNOSIS — T56891A Toxic effect of other metals, accidental (unintentional), initial encounter: Secondary | ICD-10-CM

## 2017-11-06 DIAGNOSIS — Y92199 Unspecified place in other specified residential institution as the place of occurrence of the external cause: Secondary | ICD-10-CM | POA: Insufficient documentation

## 2017-11-06 DIAGNOSIS — R440 Auditory hallucinations: Secondary | ICD-10-CM | POA: Diagnosis not present

## 2017-11-06 DIAGNOSIS — W19XXXA Unspecified fall, initial encounter: Secondary | ICD-10-CM | POA: Insufficient documentation

## 2017-11-06 DIAGNOSIS — Z046 Encounter for general psychiatric examination, requested by authority: Secondary | ICD-10-CM | POA: Diagnosis present

## 2017-11-06 DIAGNOSIS — Z79899 Other long term (current) drug therapy: Secondary | ICD-10-CM | POA: Diagnosis not present

## 2017-11-06 DIAGNOSIS — R7989 Other specified abnormal findings of blood chemistry: Secondary | ICD-10-CM

## 2017-11-06 DIAGNOSIS — F250 Schizoaffective disorder, bipolar type: Secondary | ICD-10-CM | POA: Diagnosis present

## 2017-11-06 DIAGNOSIS — F259 Schizoaffective disorder, unspecified: Secondary | ICD-10-CM | POA: Insufficient documentation

## 2017-11-06 DIAGNOSIS — F71 Moderate intellectual disabilities: Secondary | ICD-10-CM | POA: Diagnosis present

## 2017-11-06 LAB — CBC WITH DIFFERENTIAL/PLATELET
BASOS ABS: 0 10*3/uL (ref 0–0.1)
Basophils Relative: 0 %
EOS ABS: 0 10*3/uL (ref 0–0.7)
Eosinophils Relative: 1 %
HCT: 37.7 % — ABNORMAL LOW (ref 40.0–52.0)
HEMOGLOBIN: 12.7 g/dL — AB (ref 13.0–18.0)
LYMPHS ABS: 0.6 10*3/uL — AB (ref 1.0–3.6)
Lymphocytes Relative: 15 %
MCH: 32 pg (ref 26.0–34.0)
MCHC: 33.6 g/dL (ref 32.0–36.0)
MCV: 95.2 fL (ref 80.0–100.0)
MONOS PCT: 4 %
Monocytes Absolute: 0.1 10*3/uL — ABNORMAL LOW (ref 0.2–1.0)
NEUTROS PCT: 80 %
Neutro Abs: 3.3 10*3/uL (ref 1.4–6.5)
PLATELETS: 166 10*3/uL (ref 150–440)
RBC: 3.96 MIL/uL — AB (ref 4.40–5.90)
RDW: 14.5 % (ref 11.5–14.5)
WBC: 4.1 10*3/uL (ref 3.8–10.6)

## 2017-11-06 LAB — VALPROIC ACID LEVEL: Valproic Acid Lvl: 70 ug/mL (ref 50.0–100.0)

## 2017-11-06 LAB — SALICYLATE LEVEL: Salicylate Lvl: <7 mg/dL (ref 2.8–30.0)

## 2017-11-06 LAB — BASIC METABOLIC PANEL
Anion gap: 5 (ref 5–15)
BUN: 35 mg/dL — AB (ref 6–20)
CHLORIDE: 108 mmol/L (ref 98–111)
CO2: 29 mmol/L (ref 22–32)
CREATININE: 1.55 mg/dL — AB (ref 0.61–1.24)
Calcium: 9.9 mg/dL (ref 8.9–10.3)
GFR calc non Af Amer: 49 mL/min — ABNORMAL LOW (ref >60)
GFR, EST AFRICAN AMERICAN: 57 mL/min — AB (ref >60)
Glucose, Bld: 69 mg/dL — ABNORMAL LOW (ref 70–99)
POTASSIUM: 4.4 mmol/L (ref 3.5–5.1)
Sodium: 142 mmol/L (ref 135–145)

## 2017-11-06 LAB — ETHANOL

## 2017-11-06 LAB — LITHIUM LEVEL: Lithium Lvl: 1.38 mmol/L — ABNORMAL HIGH (ref 0.60–1.20)

## 2017-11-06 LAB — ACETAMINOPHEN LEVEL

## 2017-11-06 LAB — TSH: TSH: 5.885 u[IU]/mL — ABNORMAL HIGH (ref 0.350–4.500)

## 2017-11-06 LAB — TROPONIN I

## 2017-11-06 MED ORDER — SODIUM CHLORIDE 0.9 % IV BOLUS
1000.0000 mL | Freq: Once | INTRAVENOUS | Status: AC
  Administered 2017-11-06: 1000 mL via INTRAVENOUS

## 2017-11-06 NOTE — Discharge Instructions (Addendum)
Psychiatry recommends stopping the Benztropine and Lithium medication.  Follow up with your psychiatrist tomorrow for continued monitoring of your symptoms.  Your CT scan of the head was normal today. Your lab tests were okay, except the lithium level was slightly elevated.
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
## 2017-11-06 NOTE — Consult Note (Signed)
[place] [facility] Consult   Reason for Consult: Consult for this 57 year old man with a history of intellectual disability and mental health problems who presented to the hospital for recent change in behavior with frequent falling onto the ground Referring Physician: Joni Fears Patient Identification: Austin Gates MRN:  [id number] Principal Diagnosis: Fall Diagnosis:   Patient Active Problem List   Diagnosis Date Noted  . Elevated lithium level [R79.89] 11/06/2017  . Fall [W19.XXXA] 11/06/2017  . Schizoaffective disorder, bipolar type ([place]) [F25.0] 10/04/2016  . Tobacco use disorder [F17.200] 09/30/2016  . 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 male patient admitted with "my legs go out".  HPI: Patient seen chart reviewed.  Patient is a 57 year old man with moderate intellectual disability and schizoaffective disorder who was brought in by his group home because of some problem behavior.  The patient himself complained of chest pain but at the group home representative suggests that that is not really an acute symptom.  The problem is more that he has been frequently falling.  His falls are not exactly uncontrolled falls but are controlled episodes of sitting down on the ground or crawling around on the floor.  Patient tells me his legs feel like they are going out.  He answers yes when I asked him about dizziness although he is not the most reliable historian.  He also endorses having voices talk to him.  Denies any suicidal or homicidal thoughts.  Denies any substance abuse.  Lithium level came back as 1.38 creatinine 1.55.  Social history: Lives in a group home.  Sister is his guardian.  Has good wraparound care as far as I can tell.  Medical history: Significant medical problems had not otherwise been identified.  Substance abuse history: No significant history of alcohol  or drug abuse  Past Psychiatric History: Patient has a long-standing diagnosis of mental health problems.  He was admitted to our hospital about 1 year ago with a similar set of symptoms.  At that time he was started on mood stabilizers and showed some improvement.  He was also at that time however agitated and aggressive.  No known history of suicide attempts.  Some agitation when he is not doing well.  Patient is on clozapine and lithium and Depakote as part of his medicine regimen  Risk to Self:   Risk to Others:   Prior Inpatient Therapy:   Prior Outpatient Therapy:    Past Medical History:  Past Medical History:  Diagnosis Date  . Diabetes mellitus without complication ([place])   . Hypertension     Past Surgical History:  Procedure Laterality Date  . KNEE ARTHROSCOPY     Family History:  Family History  Problem Relation Age of Onset  . Diabetes Mother    Family Psychiatric  History: Unknown Social History:  Social History   Substance and Sexual Activity  Alcohol Use No     Social History   Substance and Sexual Activity  Drug Use No    Social History   Socioeconomic History  . Marital status: Single    Spouse name: Not on file  . Number of children: Not on file  . Years of education: Not on file  . Highest education level: Not on file  Occupational History  . Not on file  Social Needs  . Financial resource strain: Not on file  . Food insecurity:  Worry: Not on file    Inability: Not on file  . Transportation needs:    Medical: Not on file    Non-medical: Not on file  Tobacco Use  . Smoking status: Current Every Day Smoker    Packs/day: 1.00    Types: Cigarettes  . Smokeless tobacco: Never Used  Substance and Sexual Activity  . Alcohol use: No  . Drug use: No  . Sexual activity: Never  Lifestyle  . Physical activity:    Days per week: Not on file    Minutes per session: Not on file  . Stress: Not on file  Relationships  . Social connections:     Talks on phone: Not on file    Gets together: Not on file    Attends religious service: Not on file    Active member of club or organization: Not on file    Attends meetings of clubs or organizations: Not on file    Relationship status: Not on file  Other Topics Concern  . Not on file  Social History Narrative  . Not on file   Additional Social History:    Allergies:  No Known Allergies  Labs:  Results for orders placed or performed during the hospital encounter of 11/06/17 (from the past 48 hour(s))  Basic metabolic panel     Status: Abnormal   Collection Time: 11/06/17  2:31 PM  Result Value Ref Range   Sodium 142 135 - 145 mmol/L   Potassium 4.4 3.5 - 5.1 mmol/L   Chloride 108 98 - 111 mmol/L   CO2 29 22 - 32 mmol/L   Glucose, Bld 69 (L) 70 - 99 mg/dL   BUN 35 (H) 6 - 20 mg/dL   Creatinine, Ser 1.55 (H) 0.61 - 1.24 mg/dL   Calcium 9.9 8.9 - 10.3 mg/dL   GFR calc non Af Amer 49 (L) >60 mL/min   GFR calc Af Amer 57 (L) >60 mL/min    Comment: (NOTE) The eGFR has been calculated using the CKD EPI equation. This calculation has not been validated in all clinical situations. eGFR's persistently <60 mL/min signify possible Chronic Kidney Disease.    Anion gap 5 5 - 15    Comment: Performed at [facility], [address] [place], [place] [address]  CBC with Differential     Status: Abnormal   Collection Time: 11/06/17  2:31 PM  Result Value Ref Range   WBC 4.1 3.8 - 10.6 K/uL   RBC 3.96 (L) 4.40 - 5.90 MIL/uL   Hemoglobin 12.7 (L) 13.0 - 18.0 g/dL   HCT 37.7 (L) 40.0 - 52.0 %   MCV 95.2 80.0 - 100.0 fL   MCH 32.0 26.0 - 34.0 pg   MCHC 33.6 32.0 - 36.0 g/dL   RDW 14.5 11.5 - 14.5 %   Platelets 166 150 - 440 K/uL   Neutrophils Relative % 80 %   Neutro Abs 3.3 1.4 - 6.5 K/uL   Lymphocytes Relative 15 %   Lymphs Abs 0.6 (L) 1.0 - 3.6 K/uL   Monocytes Relative 4 %   Monocytes Absolute 0.1 (L) 0.2 - 1.0 K/uL   Eosinophils Relative 1 %   Eosinophils Absolute  0.0 0 - 0.7 K/uL   Basophils Relative 0 %   Basophils Absolute 0.0 0 - 0.1 K/uL    Comment: Performed at [facility], [address] [place], [name] [address]  Acetaminophen level     Status: Abnormal   Collection Time:  11/06/17  2:31 PM  Result Value Ref Range   Acetaminophen (Tylenol), Serum <10 (L) 10 - 30 ug/mL    Comment: (NOTE) Therapeutic concentrations vary significantly. A range of 10-30 ug/mL  may be an effective concentration for many patients. However, some  are best treated at concentrations outside of this range. Acetaminophen concentrations >150 ug/mL at 4 hours after ingestion  and >50 ug/mL at 12 hours after ingestion are often associated with  toxic reactions. Performed at [facility], [address] [place], [place] [address]   Salicylate level     Status: None   Collection Time: 11/06/17  2:31 PM  Result Value Ref Range   Salicylate Lvl <3.7 2.8 - 30.0 mg/dL    Comment: Performed at [facility] [address] [place], [place] [address]  Ethanol     Status: None   Collection Time: 11/06/17  2:31 PM  Result Value Ref Range   Alcohol, Ethyl (B) <10 <10 mg/dL    Comment: (NOTE) Lowest detectable limit for serum alcohol is 10 mg/dL. For medical purposes only. Performed at [facility], [address] [place], [place] [address]   Troponin I     Status: None   Collection Time: 11/06/17  2:31 PM  Result Value Ref Range   Troponin I <0.03 <0.03 ng/mL    Comment: Performed at [facility], [address] [place], [place] [address]  TSH     Status: Abnormal   Collection Time: 11/06/17  2:31 PM  Result Value Ref Range   TSH 5.885 (H) 0.350 - 4.500 uIU/mL    Comment: Performed by a 3rd Generation assay with a functional sensitivity of <=0.01 uIU/mL. Performed at [facility], [address] [place], [place] [address]   Lithium level     Status: Abnormal   Collection Time: 11/06/17  2:31 PM   Result Value Ref Range   Lithium Lvl 1.38 (H) 0.60 - 1.20 mmol/L    Comment: Performed at [facility], [address] [place], [place] [address]  Valproic acid level     Status: None   Collection Time: 11/06/17  2:31 PM  Result Value Ref Range   Valproic Acid Lvl 70 50.0 - 100.0 ug/mL    Comment: Performed at [facility], [address] [place], [place] [address]    No current facility-administered medications for this encounter.    Current Outpatient Medications  Medication Sig Dispense Refill  . Cholecalciferol (VITAMIN D-3) 5000 units TABS Take 1 tablet by mouth daily.    . cloZAPine (CLOZARIL) 100 MG tablet Take 1 tablet (100 mg total) by mouth 2 (two) times daily. 60 tablet 0  . cloZAPine (CLOZARIL) 100 MG tablet Take 3 tablets (300 mg total) by mouth at bedtime. 90 tablet 0  . desmopressin (DDAVP) 0.2 MG tablet Take 0.2 mg by mouth at bedtime.    . divalproex (DEPAKOTE ER) 500 MG 24 hr tablet Take 3 tablets (1,500 mg total) by mouth at bedtime. 90 tablet 0  . docusate sodium (COLACE) 100 MG capsule Take 1 capsule (100 mg total) by mouth 2 (two) times daily. 60 capsule 0  . ibuprofen (ADVIL,MOTRIN) 600 MG tablet Take 1 tablet (600 mg total) by mouth every 8 (eight) hours as needed. 30 tablet 0  . lithium carbonate (LITHOBID) 300 MG CR tablet Take 1 tablet (300 mg total) by mouth every 12 (twelve) hours. 60 tablet 0  . metoprolol tartrate (LOPRESSOR) 25 MG tablet  Take 0.5 tablets (12.5 mg total) by mouth 2 (two) times daily. 60 tablet 0  . polyethylene glycol (MIRALAX / GLYCOLAX) packet Take 17 g by mouth every other day. 15 each 0    Musculoskeletal: Strength & Muscle Tone: decreased Gait & Station: unsteady Patient leans: N/A  Psychiatric Specialty Exam: Physical Exam  Nursing note and vitals reviewed. Constitutional: He appears well-developed.  HENT:  Head: Normocephalic and atraumatic.  Eyes: Pupils are equal, round, and reactive to light.  Conjunctivae are normal.  Neck: Normal range of motion.  Cardiovascular: Regular rhythm and normal heart sounds.  Respiratory: Effort normal. No respiratory distress.  GI: Soft.  Musculoskeletal: Normal range of motion.  Neurological: He is alert.  Skin: Skin is warm and dry.  Psychiatric: His affect is blunt. His speech is tangential. He is not agitated and not aggressive. Thought content is not paranoid. Cognition and memory are impaired. He expresses impulsivity and inappropriate judgment. He expresses no homicidal and no suicidal ideation. He is noncommunicative. He exhibits abnormal recent memory and abnormal remote memory.    Review of Systems  Constitutional: Negative.   HENT: Negative.   Eyes: Negative.   Respiratory: Negative.   Cardiovascular: Negative.   Gastrointestinal: Negative.   Musculoskeletal: Positive for falls.  Skin: Negative.   Neurological: Negative.   Psychiatric/Behavioral: Positive for hallucinations. Negative for depression, memory loss, substance abuse and suicidal ideas. The patient is nervous/anxious and has insomnia.     Blood pressure 112/83, pulse 85, temperature 97.8 F (36.6 C), temperature source Oral, resp. rate (!) 21, height _0  (1.854 m), weight 65.8 kg, SpO2 97 %.Body mass index is 19.13 kg/m.  General Appearance: Disheveled  Eye Contact:  Good  Speech:  Slow  Volume:  Decreased  Mood:  Anxious  Affect:  Constricted  Thought Process:  Disorganized  Orientation:  Full (Time, Place, and Person)  Thought Content:  Illogical, Rumination and Tangential  Suicidal Thoughts:  No  Homicidal Thoughts:  No  Memory:  Immediate;   Fair Recent;   Fair Remote;   Fair  Judgement:  Impaired  Insight:  Shallow  Psychomotor Activity:  Decreased  Concentration:  Concentration: Poor  Recall:  Poor  Fund of Knowledge:  Poor  Language:  Poor  Akathisia:  Negative  Handed:  Right  AIMS (if indicated):     Assets:  Desire for  Improvement Housing Resilience Social Support  ADL's:  Impaired  Cognition:  Impaired,  Moderate  Sleep:        Treatment Plan Summary: Medication management and Plan 57 year old man with intellectual disability and mental health problems has recently been getting down on the ground crawling around sitting down talking about how his legs are giving out.  This is the main issue that the group home is concerned about.  Patient's lithium level is elevated and his creatinine is elevated.  The lithium level is elevated enough that it is probably significantly symptomatic.  My advice is that lithium be discontinued and that the outpatient provider who is prescribing it be called immediately to talk about further follow-up.  Meanwhile I have also recommended that they consider discontinuing Cogentin which really does not serve any specific purpose except to add extra anticholinergic load.  Patient does not meet commitment criteria does not require inpatient psychiatric treatment as far as I can see.  Case reviewed with emergency room doctor.  Disposition: No evidence of imminent risk to self or others at present.   Patient does not meet  criteria for psychiatric inpatient admission. Supportive therapy provided about ongoing stressors. Discussed crisis plan, support from social network, calling 911, coming to the Emergency Department, and calling Suicide Hotline.  Alethia Berthold, MD 11/06/2017 4:26 PM

## 2017-11-06 NOTE — ED Triage Notes (Signed)
Pt here from home for weakness since end of august, hearing voices, and shortness of breath.

## 2017-11-06 NOTE — ED Notes (Signed)
Patient transported to CT 

## 2017-11-06 NOTE — ED Notes (Signed)
Pt legal guardian notified of pt status and informed of discharge and follow up instructions. [name] at [phone]

## 2017-11-14 IMAGING — CT CT HEAD W/O CM
3 of 6 series · 16 of 47 positions shown, 19 images · non-contrast
Comparison: None.

CLINICAL DATA: Near syncope, fall.

EXAM:
CT HEAD WITHOUT CONTRAST
TECHNIQUE: Contiguous axial images were obtained from the base of the skull
through the vertex without intravenous contrast.

[Series 2: head wo · axial · 0.47mm/px · z∈[-140,-20]mm · 11 of 29 slices shown, 14 images]
[im 3/29  brain]
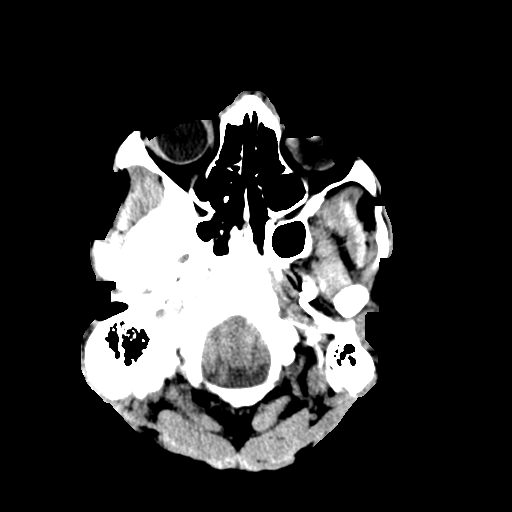
[im 3/29  bone]
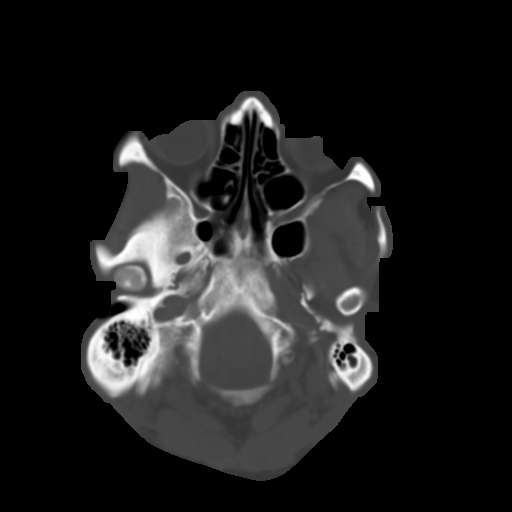
[im 5/29  brain]
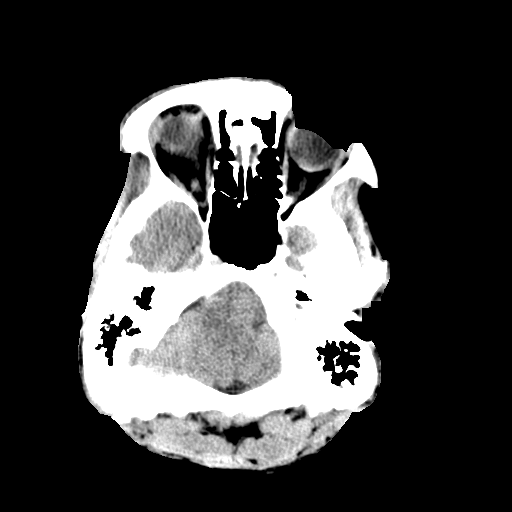
[im 7/29  brain]
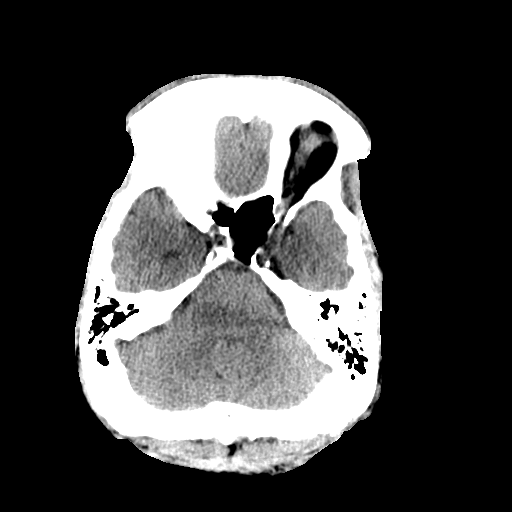
[im 11/29  brain]
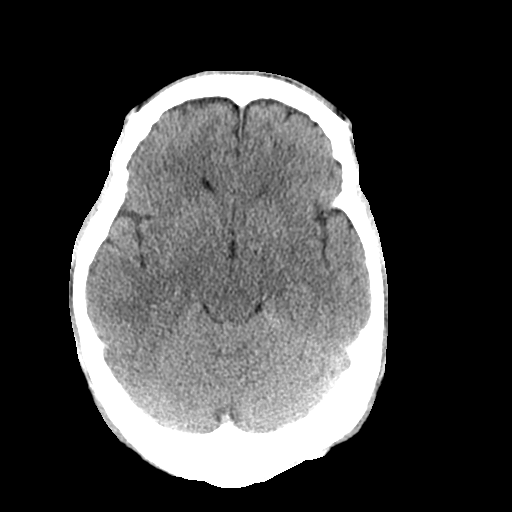
[im 13/29  brain]
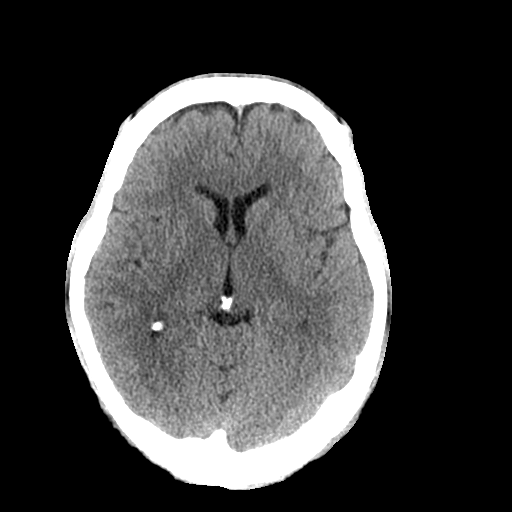
[im 13/29  bone]
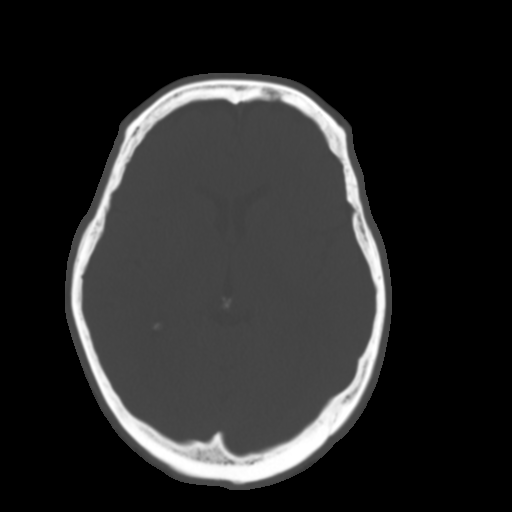
[im 15/29  brain]
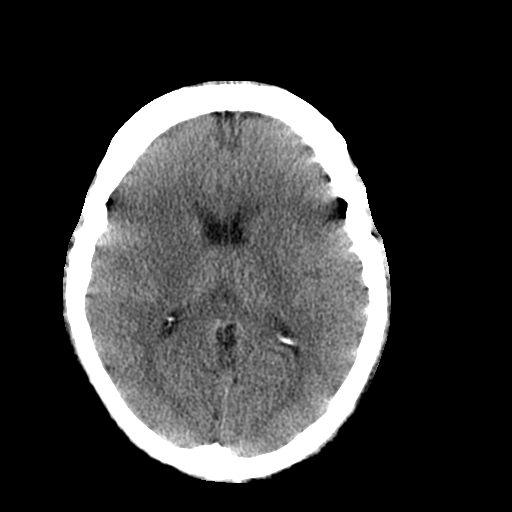
[im 17/29  brain]
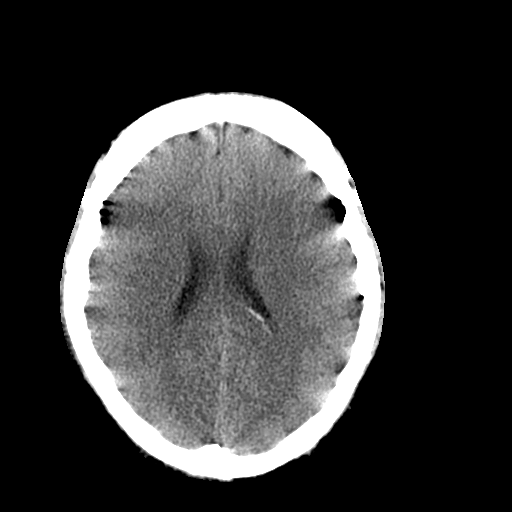
[im 19/29  brain]
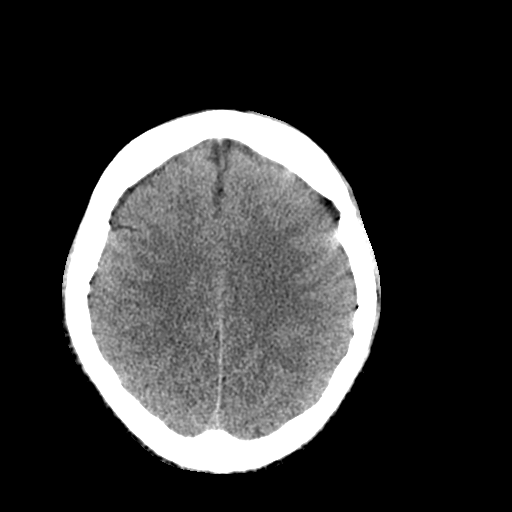
[im 23/29  brain]
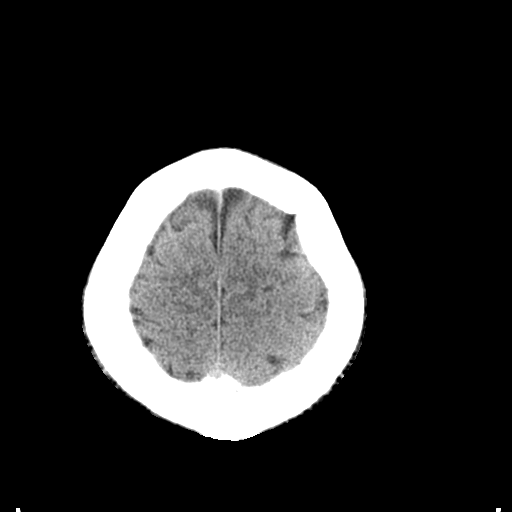
[im 23/29  bone]
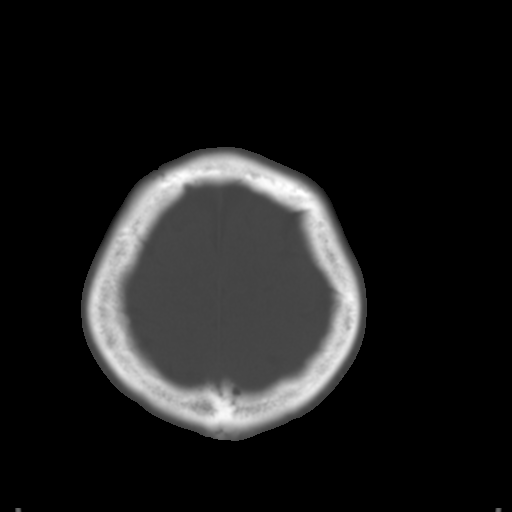
[im 25/29  brain]
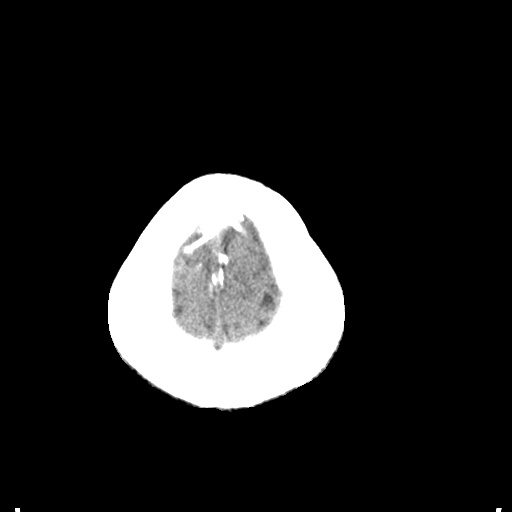
[im 27/29  brain]
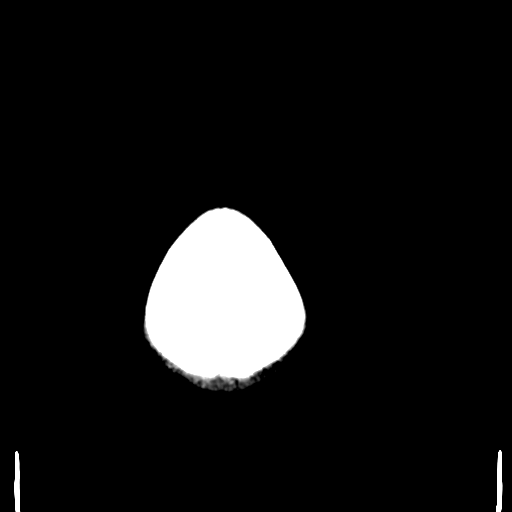

[Series 6: coronal soft tissue · coronal · 0.35mm/px · 3 of 68 slices shown]
[im 17/68  brain]
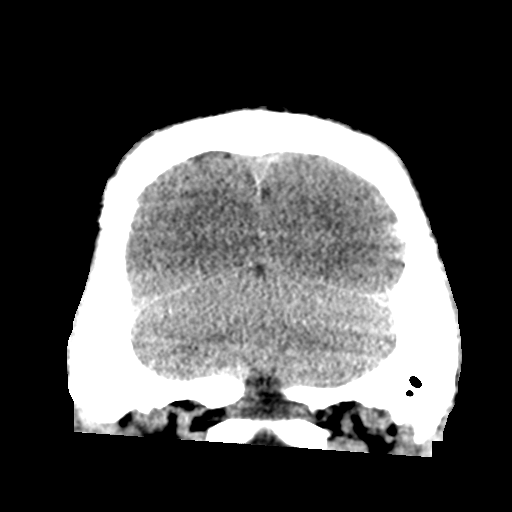
[im 34/68  brain]
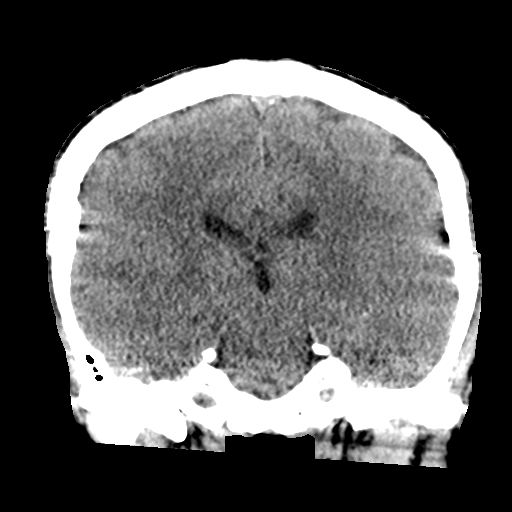
[im 51/68  brain]
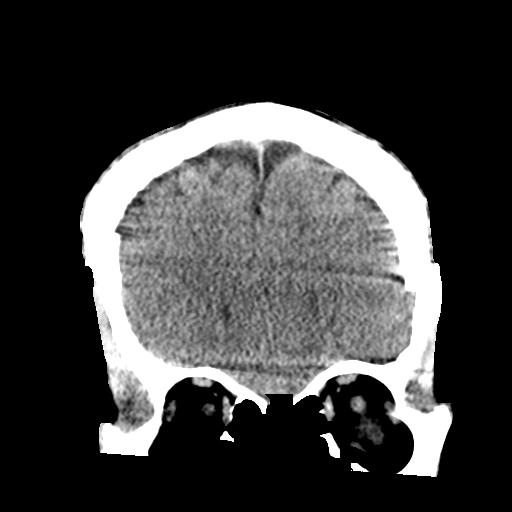

[Series 9: sagittal soft tissue · sagittal · 0.33mm/px · 2 of 55 slices shown]
[im 19/55  brain]
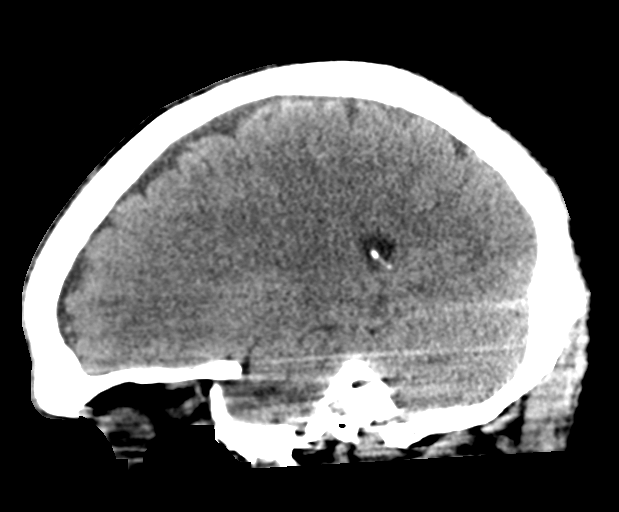
[im 37/55  brain]
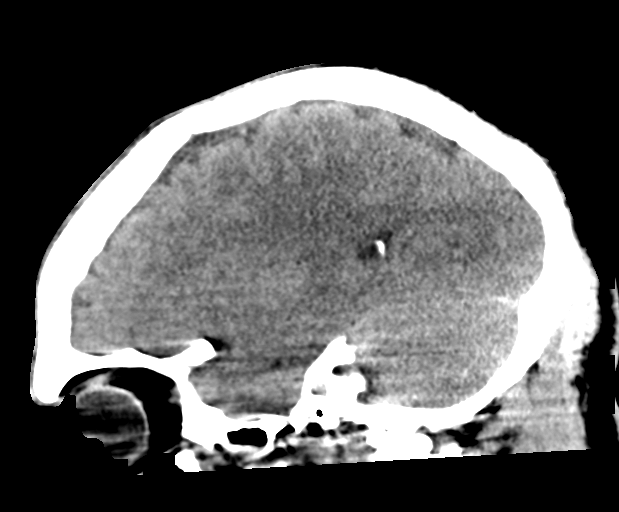

[16 of 47 positions shown; findings below may reference images not displayed]

FINDINGS: Brain: No acute intracranial abnormality. Specifically, no
hemorrhage, hydrocephalus, mass lesion, acute infarction, or
significant intracranial injury.

Vascular: No hyperdense vessel or unexpected calcification.

Skull: No acute calvarial abnormality.

Sinuses/Orbits: Visualized paranasal sinuses and mastoids clear.
Orbital soft tissues unremarkable.

Other: None
IMPRESSION: Normal study.

## 2017-11-15 ENCOUNTER — Emergency Department: Payer: Medicaid Other

## 2017-11-15 ENCOUNTER — Other Ambulatory Visit: Payer: Self-pay

## 2017-11-15 ENCOUNTER — Inpatient Hospital Stay
Admission: EM | Admit: 2017-11-15 | Discharge: 2017-11-24 | DRG: 682 | Disposition: A | Discharge status: Home Health | Payer: Medicaid Other | Attending: Internal Medicine | Admitting: Internal Medicine

## 2017-11-15 ENCOUNTER — Encounter: Payer: Self-pay | Admitting: *Deleted

## 2017-11-15 DIAGNOSIS — R1310 Dysphagia, unspecified: Secondary | ICD-10-CM | POA: Diagnosis present

## 2017-11-15 DIAGNOSIS — J690 Pneumonitis due to inhalation of food and vomit: Secondary | ICD-10-CM | POA: Diagnosis present

## 2017-11-15 DIAGNOSIS — N3944 Nocturnal enuresis: Secondary | ICD-10-CM | POA: Diagnosis present

## 2017-11-15 DIAGNOSIS — N179 Acute kidney failure, unspecified: Secondary | ICD-10-CM | POA: Diagnosis present

## 2017-11-15 DIAGNOSIS — F250 Schizoaffective disorder, bipolar type: Secondary | ICD-10-CM | POA: Diagnosis present

## 2017-11-15 DIAGNOSIS — R4182 Altered mental status, unspecified: Secondary | ICD-10-CM | POA: Diagnosis not present

## 2017-11-15 DIAGNOSIS — Z538 Procedure and treatment not carried out for other reasons: Secondary | ICD-10-CM | POA: Diagnosis not present

## 2017-11-15 DIAGNOSIS — F0390 Unspecified dementia without behavioral disturbance: Secondary | ICD-10-CM | POA: Diagnosis present

## 2017-11-15 DIAGNOSIS — G9341 Metabolic encephalopathy: Secondary | ICD-10-CM | POA: Diagnosis present

## 2017-11-15 DIAGNOSIS — R52 Pain, unspecified: Secondary | ICD-10-CM

## 2017-11-15 DIAGNOSIS — E871 Hypo-osmolality and hyponatremia: Secondary | ICD-10-CM | POA: Diagnosis not present

## 2017-11-15 DIAGNOSIS — E43 Unspecified severe protein-calorie malnutrition: Secondary | ICD-10-CM

## 2017-11-15 DIAGNOSIS — E860 Dehydration: Secondary | ICD-10-CM | POA: Diagnosis present

## 2017-11-15 DIAGNOSIS — Z6821 Body mass index (BMI) 21.0-21.9, adult: Secondary | ICD-10-CM | POA: Diagnosis not present

## 2017-11-15 DIAGNOSIS — K5900 Constipation, unspecified: Secondary | ICD-10-CM | POA: Diagnosis present

## 2017-11-15 DIAGNOSIS — R402144 Coma scale, eyes open, spontaneous, 24 hours or more after hospital admission: Secondary | ICD-10-CM | POA: Diagnosis present

## 2017-11-15 DIAGNOSIS — T68XXXA Hypothermia, initial encounter: Secondary | ICD-10-CM

## 2017-11-15 DIAGNOSIS — F71 Moderate intellectual disabilities: Secondary | ICD-10-CM | POA: Diagnosis present

## 2017-11-15 DIAGNOSIS — R634 Abnormal weight loss: Secondary | ICD-10-CM | POA: Diagnosis not present

## 2017-11-15 DIAGNOSIS — R4702 Dysphasia: Secondary | ICD-10-CM | POA: Diagnosis present

## 2017-11-15 DIAGNOSIS — Z79899 Other long term (current) drug therapy: Secondary | ICD-10-CM

## 2017-11-15 DIAGNOSIS — F17210 Nicotine dependence, cigarettes, uncomplicated: Secondary | ICD-10-CM | POA: Diagnosis present

## 2017-11-15 DIAGNOSIS — E119 Type 2 diabetes mellitus without complications: Secondary | ICD-10-CM | POA: Diagnosis present

## 2017-11-15 DIAGNOSIS — R140 Abdominal distension (gaseous): Secondary | ICD-10-CM | POA: Diagnosis present

## 2017-11-15 DIAGNOSIS — D181 Lymphangioma, any site: Secondary | ICD-10-CM | POA: Diagnosis present

## 2017-11-15 DIAGNOSIS — Z515 Encounter for palliative care: Secondary | ICD-10-CM | POA: Diagnosis not present

## 2017-11-15 DIAGNOSIS — I959 Hypotension, unspecified: Secondary | ICD-10-CM | POA: Diagnosis present

## 2017-11-15 DIAGNOSIS — Z7189 Other specified counseling: Secondary | ICD-10-CM | POA: Diagnosis not present

## 2017-11-15 DIAGNOSIS — R402244 Coma scale, best verbal response, confused conversation, 24 hours or more after hospital admission: Secondary | ICD-10-CM | POA: Diagnosis present

## 2017-11-15 DIAGNOSIS — D696 Thrombocytopenia, unspecified: Secondary | ICD-10-CM | POA: Diagnosis not present

## 2017-11-15 DIAGNOSIS — R296 Repeated falls: Secondary | ICD-10-CM | POA: Diagnosis present

## 2017-11-15 DIAGNOSIS — R9401 Abnormal electroencephalogram [EEG]: Secondary | ICD-10-CM | POA: Diagnosis present

## 2017-11-15 DIAGNOSIS — D72810 Lymphocytopenia: Secondary | ICD-10-CM | POA: Diagnosis not present

## 2017-11-15 DIAGNOSIS — I10 Essential (primary) hypertension: Secondary | ICD-10-CM | POA: Diagnosis present

## 2017-11-15 DIAGNOSIS — R970 Elevated carcinoembryonic antigen [CEA]: Secondary | ICD-10-CM | POA: Diagnosis present

## 2017-11-15 DIAGNOSIS — D61818 Other pancytopenia: Secondary | ICD-10-CM | POA: Diagnosis present

## 2017-11-15 DIAGNOSIS — T4395XA Adverse effect of unspecified psychotropic drug, initial encounter: Secondary | ICD-10-CM | POA: Diagnosis present

## 2017-11-15 DIAGNOSIS — R402364 Coma scale, best motor response, obeys commands, 24 hours or more after hospital admission: Secondary | ICD-10-CM | POA: Diagnosis present

## 2017-11-15 DIAGNOSIS — J189 Pneumonia, unspecified organism: Secondary | ICD-10-CM

## 2017-11-15 LAB — BASIC METABOLIC PANEL
Anion gap: 10 (ref 5–15)
BUN: 44 mg/dL — AB (ref 6–20)
CO2: 28 mmol/L (ref 22–32)
CREATININE: 1.57 mg/dL — AB (ref 0.61–1.24)
Calcium: 9.8 mg/dL (ref 8.9–10.3)
Chloride: 110 mmol/L (ref 98–111)
GFR calc non Af Amer: 48 mL/min — ABNORMAL LOW (ref >60)
GFR, EST AFRICAN AMERICAN: 56 mL/min — AB (ref >60)
Glucose, Bld: 102 mg/dL — ABNORMAL HIGH (ref 70–99)
Potassium: 4.7 mmol/L (ref 3.5–5.1)
Sodium: 148 mmol/L — ABNORMAL HIGH (ref 135–145)

## 2017-11-15 LAB — URINALYSIS, COMPLETE (UACMP) WITH MICROSCOPIC
BACTERIA UA: NONE SEEN
BILIRUBIN URINE: NEGATIVE
Glucose, UA: NEGATIVE mg/dL
HGB URINE DIPSTICK: NEGATIVE
KETONES UR: 5 mg/dL — AB
LEUKOCYTES UA: NEGATIVE
NITRITE: NEGATIVE
Protein, ur: NEGATIVE mg/dL
SPECIFIC GRAVITY, URINE: 1.024 (ref 1.005–1.030)
SQUAMOUS EPITHELIAL / LPF: NONE SEEN (ref 0–5)
pH: 5 (ref 5.0–8.0)

## 2017-11-15 LAB — CBC
HCT: 34.7 % — ABNORMAL LOW (ref 40.0–52.0)
Hemoglobin: 12 g/dL — ABNORMAL LOW (ref 13.0–18.0)
MCH: 32.7 pg (ref 26.0–34.0)
MCHC: 34.6 g/dL (ref 32.0–36.0)
MCV: 94.4 fL (ref 80.0–100.0)
PLATELETS: 96 10*3/uL — AB (ref 150–440)
RBC: 3.68 MIL/uL — AB (ref 4.40–5.90)
RDW: 15 % — AB (ref 11.5–14.5)
WBC: 7.9 10*3/uL (ref 3.8–10.6)

## 2017-11-15 LAB — LITHIUM LEVEL: Lithium Lvl: 0.26 mmol/L — ABNORMAL LOW (ref 0.60–1.20)

## 2017-11-15 LAB — TROPONIN I: Troponin I: <0.03 ng/mL (ref <0.03)

## 2017-11-15 MED ORDER — SODIUM CHLORIDE 0.9 % IV SOLN
Freq: Once | INTRAVENOUS | Status: AC
  Administered 2017-11-15: 17:00:00 via INTRAVENOUS

## 2017-11-15 MED ORDER — SODIUM CHLORIDE 0.9 % IV SOLN
Freq: Once | INTRAVENOUS | Status: AC
  Administered 2017-11-15: 19:00:00 via INTRAVENOUS

## 2017-11-15 MED ORDER — SODIUM CHLORIDE 0.9 % IV BOLUS
1000.0000 mL | Freq: Once | INTRAVENOUS | Status: AC
  Administered 2017-11-15: 1000 mL via INTRAVENOUS

## 2017-11-15 MED ORDER — HYDROCORTISONE NA SUCCINATE PF 100 MG IJ SOLR
100.0000 mg | INTRAMUSCULAR | Status: AC
  Administered 2017-11-16: 100 mg via INTRAVENOUS
  Filled 2017-11-15: qty 2

## 2017-11-15 MED ORDER — KETOROLAC TROMETHAMINE 30 MG/ML IJ SOLN
INTRAMUSCULAR | Status: AC
  Filled 2017-11-15: qty 1

## 2017-11-15 MED ORDER — SODIUM CHLORIDE 0.9 % IV SOLN
Freq: Once | INTRAVENOUS | Status: AC
  Administered 2017-11-15: 21:00:00 via INTRAVENOUS

## 2017-11-15 NOTE — ED Provider Notes (Signed)
[facility] Emergency Department Provider Note   ____________________________________________   I have reviewed the triage vital signs and the nursing notes.   HISTORY  Chief Complaint Weakness  History limited by intellectual disability, history obtained from caregiver   HPI Austin Gates is a 57 y.o. male who presents to the emergency department today by caregivers because of concerns for weakness.  He states they have noticed this going on for the past 3 weeks.  They state that whenever he gets up his legs start to tremble.  He has noted also to become more short of breath with ambulation.  They state that it has been getting worse for the past week.  Was evaluated in the emergency department for partly this reason a little over 1 week ago without obvious etiology.  They have not noticed any fevers.  Patient states he has had a good appetite.    Per medical record review patient has a history of emergency department visit over 1 week ago for weakness.  Past Medical History:  Diagnosis Date  . Diabetes mellitus without complication ([place])   . Hypertension     Patient Active Problem List   Diagnosis Date Noted  . Elevated lithium level 11/06/2017  . Fall 11/06/2017  . Schizoaffective disorder, bipolar type ([place]) 10/04/2016  . Tobacco use disorder 09/30/2016  . 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 KNEE ARTHROSCOPY      Prior to Admission medications   Medication Sig Start Date End Date Taking? Authorizing Provider  Cholecalciferol (VITAMIN D-3) 5000 units TABS Take 1 tablet by mouth daily.    [provider]  cloZAPine (CLOZARIL) 100 MG tablet Take 1 tablet (100 mg total) by mouth 2 (two) times daily. 10/06/16   Hildred Priest, MD  cloZAPine (CLOZARIL) 100 MG tablet Take 3 tablets (300 mg total) by mouth at bedtime. 10/06/16   Hildred Priest, MD   desmopressin (DDAVP) 0.2 MG tablet Take 0.2 mg by mouth at bedtime.    [provider]  divalproex (DEPAKOTE ER) 500 MG 24 hr tablet Take 3 tablets (1,500 mg total) by mouth at bedtime. 10/06/16   Hildred Priest, MD  docusate sodium (COLACE) 100 MG capsule Take 1 capsule (100 mg total) by mouth 2 (two) times daily. 10/04/16   Hildred Priest, MD  ibuprofen (ADVIL,MOTRIN) 600 MG tablet Take 1 tablet (600 mg total) by mouth every 8 (eight) hours as needed. 10/13/17   Earleen Newport, MD  metoprolol tartrate (LOPRESSOR) 25 MG tablet Take 0.5 tablets (12.5 mg total) by mouth 2 (two) times daily. 10/06/16   Hildred Priest, MD  polyethylene glycol (MIRALAX / GLYCOLAX) packet Take 17 g by mouth every other day. 10/04/16   Hildred Priest, MD    Allergies Patient has no known allergies.  Family History  Problem Relation Age of Onset  . Diabetes Mother     Social History Social History   Tobacco Use  . Smoking status: Current Every Day Smoker    Packs/day: 1.00    Types: Cigarettes  . Smokeless tobacco: Never Used  Substance Use Topics  . Alcohol use: No  . Drug use: No    Review of Systems Constitutional: No fever/chills.  Positive for weakness Eyes: No visual changes. ENT: No sore throat. Cardiovascular: Denies chest pain. Respiratory: Denies shortness of breath. Gastrointestinal: No abdominal pain.  No nausea, no vomiting.  No diarrhea.   Genitourinary: Negative for dysuria.  Musculoskeletal: Positive for leg trembling Skin: Negative for rash. Neurological: Negative for headaches, focal weakness or numbness.  ____________________________________________   PHYSICAL EXAM:  VITAL SIGNS: ED Triage Vitals  Enc Vitals Group     BP 11/15/17 1613 (!) 83/55     Pulse Rate 11/15/17 1613 68     Resp 11/15/17 1613 20     Temp 11/15/17 1613 98.6 F (37 C)     Temp Source 11/15/17 1613 Oral     SpO2 11/15/17 1613 100 %      Weight 11/15/17 1617 145 lb (65.8 kg)     Height 11/15/17 1617 6\' 1"  (1.854 m)     Head Circumference --      Peak Flow --      Pain Score 11/15/17 1616 0    Constitutional: Awake and alert. Eyes: Conjunctivae are normal.  ENT      Head: Normocephalic and atraumatic.      Nose: No congestion/rhinnorhea.      Mouth/Throat: Mucous membranes are moist.      Neck: No stridor. Hematological/Lymphatic/Immunilogical: No cervical lymphadenopathy. Cardiovascular: Normal rate, regular rhythm.  No murmurs, rubs, or gallops.  Respiratory: Normal respiratory effort without tachypnea nor retractions. Breath sounds are clear and equal bilaterally. No wheezes/rales/rhonchi. Gastrointestinal: Soft and non tender. No rebound. No guarding.  Genitourinary: Deferred Musculoskeletal: Normal range of motion in all extremities. No lower extremity edema. Neurologic: Intellectual delay.  Moving all extremities Skin:  Skin is warm, dry and intact. No rash noted.   ____________________________________________    LABS (pertinent positives/negatives)  BMp na 148, k 4.7, glu 102, cr 1.57 Trop <0.03 CBC wbc 7.9, hgb 12.0, plt 96  ____________________________________________   EKG  I, Nance Pear, attending physician, personally viewed and interpreted this EKG  EKG Time: 1622 Rate: 67 Rhythm: sinus rhythm with 1st degree av block Axis: normal Intervals: qtc 462 QRS: narrow, q waves v1 ST changes: no st elevation Impression: abnormal ekg  ____________________________________________    RADIOLOGY  CXR No acute disease  ____________________________________________   PROCEDURES  Procedures  ____________________________________________   INITIAL IMPRESSION / ASSESSMENT AND PLAN / ED COURSE  Pertinent labs & imaging results that were available during my care of the patient were reviewed by me and considered in my medical decision making (see chart for details).   Patient presented  to the emergency department today because of concerns for weakness by his caregivers.  Patient was found to be persistently hypotensive here in the emergency department.  Unclear etiology.  No obvious signs of infection as patient is afebrile without significant leukocytosis.  Urine without concerning findings.  Patient was given IV fluids with persistent hypotension.  Patient will be admitted to the hospital service. _____________________________________   FINAL CLINICAL IMPRESSION(S) / ED DIAGNOSES  Final diagnoses:  Abdominal distention  Hypotension, unspecified hypotension type     Note: This dictation was prepared with Dragon dictation. Any transcriptional errors that result from this process are unintentional     Nance Pear, MD 11/15/17 2139

## 2017-11-15 NOTE — H&P (Signed)
[facility] [place] NAME: Austin Gates   MR#:  [id number]  DATE OF BIRTH:  Feb 09, 1961  DATE OF ADMISSION:  11/15/2017  PRIMARY CARE PHYSICIAN: Raelyn Number, MD   REQUESTING/REFERRING PHYSICIAN:   CHIEF COMPLAINT:   Chief Complaint  Patient presents with  . Leg Pain    HISTORY OF PRESENT ILLNESS: Austin Gates  is a 57 y.o. male with a known history of tobacco abuse, psychiatric disorder and mental retardation.  Patient lives in an assisted living. He is not able to provide reliable history due to his mental status. Patient was sent to emergency room for increased drowsiness, confusion, generalized weakness and low blood pressure.  Most of his symptoms have been going on for the past 5 to 7 days, gradually getting worse.  No reports of fever, chills, nausea, vomiting, diarrhea, bleeding.  Apparently, per ALF report, patient has been losing weight in the past few months, despite the fact that he still has a good appetite. Per records, it seems that the only new medication is desmopressin, which was added a week ago for nocturnal enuresis.  Lithium was discontinued in the past 2 weeks.  In the emergency room, patient was noted with systolic blood pressure in 70s.  This finally improved after 4 L of fluids to 80s. Blood test done emergency room are notable for sodium level of 148, creatinine level is 1.57 and BUN is 44.  Platelet count is 96 UA is negative for UTI and chest x-ray is negative for acute cardiopulmonary disease.  Abdominal x-ray shows large stool burden. Patient is admitted for further evaluation and treatment.   PAST MEDICAL HISTORY:   Schizoaffective disorder, bipolar disorder, mental retardation, tobacco abuse.  PAST SURGICAL HISTORY:  Past Surgical History:  Procedure Laterality Date  . KNEE ARTHROSCOPY      SOCIAL HISTORY:  Social History   Tobacco Use  . Smoking status: Current Every Day Smoker     Packs/day: 1.00    Types: Cigarettes  . Smokeless tobacco: Never Used  Substance Use Topics  . Alcohol use: No    FAMILY HISTORY:  Family History  Problem Relation Age of Onset  . Diabetes Mother     DRUG ALLERGIES: No Known Allergies  REVIEW OF SYSTEMS:   Unable to obtain due to patient's poor mental status.  MEDICATIONS AT HOME:  Prior to Admission medications   Medication Sig Start Date End Date Taking? Authorizing Provider  Cholecalciferol (VITAMIN D-3) 5000 units TABS Take 1 tablet by mouth daily.    [provider]  cloZAPine (CLOZARIL) 100 MG tablet Take 1 tablet (100 mg total) by mouth 2 (two) times daily. 10/06/16   Hildred Priest, MD  cloZAPine (CLOZARIL) 100 MG tablet Take 3 tablets (300 mg total) by mouth at bedtime. 10/06/16   Hildred Priest, MD  desmopressin (DDAVP) 0.2 MG tablet Take 0.2 mg by mouth at bedtime.    [provider]  divalproex (DEPAKOTE ER) 500 MG 24 hr tablet Take 3 tablets (1,500 mg total) by mouth at bedtime. 10/06/16   Hildred Priest, MD  docusate sodium (COLACE) 100 MG capsule Take 1 capsule (100 mg total) by mouth 2 (two) times daily. 10/04/16   Hildred Priest, MD  ibuprofen (ADVIL,MOTRIN) 600 MG tablet Take 1 tablet (600 mg total) by mouth every 8 (eight) hours as needed. 10/13/17   Earleen Newport, MD  metoprolol tartrate (LOPRESSOR) 25 MG tablet Take 0.5 tablets (12.5 mg total)  by mouth 2 (two) times daily. 10/06/16   Hildred Priest, MD  polyethylene glycol (MIRALAX / GLYCOLAX) packet Take 17 g by mouth every other day. 10/04/16   Hildred Priest, MD      PHYSICAL EXAMINATION:   VITAL SIGNS: Blood pressure (!) 78/59, pulse 67, temperature 98.6 F (37 C), temperature source Oral, resp. rate 13, height 6\' 1"  (1.854 m), weight 65.8 kg, SpO2 100 %.  GENERAL:  57 y.o.-year-old patient lying in the bed with no acute distress.  He looks drowsy, confused. EYES:  Pupils equal, round, reactive to light and accommodation. No scleral icterus. Extraocular muscles intact.  HEENT: Head atraumatic, normocephalic. Oropharynx and nasopharynx clear.  NECK:  Supple, no jugular venous distention. No thyroid enlargement, no tenderness.  LUNGS: Normal breath sounds bilaterally, no wheezing, rales,rhonchi or crepitation. No use of accessory muscles of respiration.  CARDIOVASCULAR: S1, S2 normal. No S3/S4.  ABDOMEN: Soft, nontender, nondistended. Bowel sounds present. No organomegaly or mass.  EXTREMITIES: No pedal edema, cyanosis, or clubbing.  NEUROLOGIC: Patient is moving all his extremities, no focal weakness appreciated.  Gait is unstable due to generalized weakness PSYCHIATRIC: The patient is drowsy, confused.  SKIN: No obvious rash, lesion, or ulcer.   LABORATORY PANEL:   CBC Recent Labs  Lab 11/15/17 1622  WBC 7.9  HGB 12.0*  HCT 34.7*  PLT 96*  MCV 94.4  MCH 32.7  MCHC 34.6  RDW 15.0*   ------------------------------------------------------------------------------------------------------------------  Chemistries  Recent Labs  Lab 11/15/17 1622  NA 148*  K 4.7  CL 110  CO2 28  GLUCOSE 102*  BUN 44*  CREATININE 1.57*  CALCIUM 9.8   ------------------------------------------------------------------------------------------------------------------ estimated creatinine clearance is 49.5 mL/min (A) (by C-G formula based on SCr of 1.57 mg/dL (H)). ------------------------------------------------------------------------------------------------------------------ No results for input(s): TSH, T4TOTAL, T3FREE, THYROIDAB in the last 72 hours.  Invalid input(s): FREET3   Coagulation profile No results for input(s): INR, PROTIME in the last 168 hours. ------------------------------------------------------------------------------------------------------------------- No results for input(s): DDIMER in the last 72  hours. -------------------------------------------------------------------------------------------------------------------  Cardiac Enzymes Recent Labs  Lab 11/15/17 1622  TROPONINI <0.03   ------------------------------------------------------------------------------------------------------------------ Invalid input(s): POCBNP  ---------------------------------------------------------------------------------------------------------------  Urinalysis    Component Value Date/Time   COLORURINE YELLOW (A) 11/15/2017 1909   APPEARANCEUR CLEAR (A) 11/15/2017 1909   LABSPEC 1.024 11/15/2017 1909   PHURINE 5.0 11/15/2017 1909   GLUCOSEU NEGATIVE 11/15/2017 1909   HGBUR NEGATIVE 11/15/2017 1909   BILIRUBINUR NEGATIVE 11/15/2017 1909   KETONESUR 5 (A) 11/15/2017 1909   PROTEINUR NEGATIVE 11/15/2017 1909   NITRITE NEGATIVE 11/15/2017 1909   LEUKOCYTESUR NEGATIVE 11/15/2017 1909     RADIOLOGY: Dg Chest 2 View  Result Date: 11/15/2017 CLINICAL DATA:  Weakness, leg pain. EXAM: CHEST - 2 VIEW COMPARISON:  None. FINDINGS: The heart size and mediastinal contours are within normal limits. Both lungs are clear. No pneumothorax or pleural effusion is noted. The visualized skeletal structures are unremarkable. IMPRESSION: No active cardiopulmonary disease. Electronically Signed   By: Marijo Conception, M.D.   On: 11/15/2017 16:55   Dg Abd 2 Views  Result Date: 11/15/2017 CLINICAL DATA:  Distention EXAM: ABDOMEN - 2 VIEW COMPARISON:  None. FINDINGS: Large stool burden throughout the colon. There is normal bowel gas pattern. No free air. No organomegaly or suspicious calcification. No acute bony abnormality. Visualized lung bases clear. IMPRESSION: Large stool burden.  No acute findings. Electronically Signed   By: Rolm Baptise M.D.   On: 11/15/2017 18:40    EKG: Orders placed or performed  during the hospital encounter of 11/15/17  . ED EKG within 10 minutes  . ED EKG within 10 minutes     IMPRESSION AND PLAN:  1.  Acute renal failure, likely prerenal, related to dehydration and possible side effects from medications.  Continue IV fluids. Will make following adjustments to his medications: -We will lower Clozaril from 500 mg a day to 200 mg a day -We will lower Depakote from 1500 mg a day to 1000 mg a day -We will D/C desmopressin  2.  Acute encephalopathy, likely related to ARF and possible side effects from medications.  We will continue to monitor clinically closely while treating with IV fluids and lowering medications doses.  3.  Hypotension, likely related to dehydration and possible side effects from medications.  No signs of infection.   We will continue to monitor BP closely while treating with IV fluids and lowering medications doses.  4.  Constipation, likely related to dehydration and possible side effects from medications.  Continue IV fluids, stool softeners and lower psych medications doses.  5.  Schizoaffective disorder, will continue psych medications at lower doses, as mentioned above under #1.  Monitor patient clinically closely.  6.  Bipolar disorder, will continue psych medications at lower doses, as mentioned above under #1.  Monitor patient clinically closely.  7.  Tobacco abuse.  Smoking cessation was discussed with patient and the family in detail.    All the records are reviewed and case discussed with patient's family ED provider and RN on call at ALF. Management plans discussed with the patient, family and they are in agreement.  CODE STATUS: Full code Code Status History    Date Active Date Inactive Code Status Order ID Comments User Context   09/29/2016 2043 10/07/2016 1514 Full Code [id number]  Gonzella Lex, MD Inpatient   09/17/2016 1901 09/18/2016 1556 Full Code [id number]  Demetrios Loll, MD Inpatient       TOTAL TIME TAKING CARE OF THIS PATIENT: 60 minutes.    Amelia Jo M.D on 11/15/2017 at 11:31 PM  Between 7am to 6pm - Pager -  [phone]  After 6pm go to [email or web address] - password [email or web address] [facility]  [phone]  CC: Primary care physician; Raelyn Number, MD

## 2017-11-15 NOTE — ED Notes (Signed)
Portable xray being done at this time

## 2017-11-15 NOTE — ED Triage Notes (Signed)
Pt brought in via wheelchair by his sister/gaurdian.  Sister reports pt has bilateral leg pain and was seen in the er last week with similar sx.  Pt has abrasions to both knees.  Sister states pt is worse.   Pt alert

## 2017-11-15 NOTE — ED Notes (Signed)
Assisted to void standing at bedside Regular meal tray given

## 2017-11-16 DIAGNOSIS — F250 Schizoaffective disorder, bipolar type: Secondary | ICD-10-CM

## 2017-11-16 LAB — BASIC METABOLIC PANEL
Anion gap: 7 (ref 5–15)
BUN: 36 mg/dL — AB (ref 6–20)
CALCIUM: 8.6 mg/dL — AB (ref 8.9–10.3)
CHLORIDE: 113 mmol/L — AB (ref 98–111)
CO2: 26 mmol/L (ref 22–32)
CREATININE: 1.38 mg/dL — AB (ref 0.61–1.24)
GFR calc Af Amer: >60 mL/min (ref >60)
GFR calc non Af Amer: 56 mL/min — ABNORMAL LOW (ref >60)
Glucose, Bld: 92 mg/dL (ref 70–99)
Potassium: 4.4 mmol/L (ref 3.5–5.1)
SODIUM: 146 mmol/L — AB (ref 135–145)

## 2017-11-16 LAB — CBC WITH DIFFERENTIAL/PLATELET

## 2017-11-16 LAB — CBC
HCT: 32.2 % — ABNORMAL LOW (ref 40.0–52.0)
Hemoglobin: 10.8 g/dL — ABNORMAL LOW (ref 13.0–18.0)
MCH: 32.5 pg (ref 26.0–34.0)
MCHC: 33.5 g/dL (ref 32.0–36.0)
MCV: 97 fL (ref 80.0–100.0)
PLATELETS: 76 10*3/uL — AB (ref 150–440)
RBC: 3.32 MIL/uL — ABNORMAL LOW (ref 4.40–5.90)
RDW: 15.7 % — AB (ref 11.5–14.5)
WBC: 5.8 10*3/uL (ref 3.8–10.6)

## 2017-11-16 LAB — CORTISOL: CORTISOL PLASMA: 53.2 ug/dL

## 2017-11-16 LAB — DIFFERENTIAL
BASOS ABS: 0 10*3/uL (ref 0–0.1)
BASOS PCT: 0 %
Eosinophils Absolute: 0 10*3/uL (ref 0–0.7)
Eosinophils Relative: 1 %
Lymphocytes Relative: 5 %
Lymphs Abs: 0.3 10*3/uL — ABNORMAL LOW (ref 1.0–3.6)
MONO ABS: 0.2 10*3/uL (ref 0.2–1.0)
Monocytes Relative: 4 %
NEUTROS PCT: 90 %
Neutro Abs: 5.2 10*3/uL (ref 1.4–6.5)

## 2017-11-16 LAB — GLUCOSE, CAPILLARY: GLUCOSE-CAPILLARY: 83 mg/dL (ref 70–99)

## 2017-11-16 MED ORDER — HEPARIN SODIUM (PORCINE) 5000 UNIT/ML IJ SOLN
5000.0000 [IU] | Freq: Three times a day (TID) | INTRAMUSCULAR | Status: DC
  Administered 2017-11-16 – 2017-11-17 (×4): 5000 [IU] via SUBCUTANEOUS
  Filled 2017-11-16 (×4): qty 1

## 2017-11-16 MED ORDER — SODIUM CHLORIDE 0.9 % IV SOLN
INTRAVENOUS | Status: DC
  Administered 2017-11-16 – 2017-11-18 (×8): via INTRAVENOUS

## 2017-11-16 MED ORDER — ONDANSETRON HCL 4 MG PO TABS: 4.0000 mg | ORAL_TABLET | Freq: Four times a day (QID) | ORAL | Status: DC | PRN

## 2017-11-16 MED ORDER — BISACODYL 5 MG PO TBEC
5.0000 mg | DELAYED_RELEASE_TABLET | Freq: Every day | ORAL | Status: DC | PRN
  Administered 2017-11-17: 5 mg via ORAL
  Filled 2017-11-16: qty 1

## 2017-11-16 MED ORDER — MIDODRINE HCL 5 MG PO TABS
5.0000 mg | ORAL_TABLET | Freq: Three times a day (TID) | ORAL | Status: DC
  Administered 2017-11-16 – 2017-11-21 (×5): 5 mg via ORAL
  Filled 2017-11-16 (×25): qty 1

## 2017-11-16 MED ORDER — POLYETHYLENE GLYCOL 3350 17 G PO PACK
17.0000 g | PACK | ORAL | Status: DC
  Administered 2017-11-16 – 2017-11-17 (×2): 17 g via ORAL
  Filled 2017-11-16 (×3): qty 1

## 2017-11-16 MED ORDER — ONDANSETRON HCL 4 MG/2ML IJ SOLN: 4.0000 mg | Freq: Four times a day (QID) | INTRAMUSCULAR | Status: DC | PRN

## 2017-11-16 MED ORDER — VITAMIN D 1000 UNITS PO TABS
5000.0000 [IU] | ORAL_TABLET | Freq: Every day | ORAL | Status: DC
  Administered 2017-11-16 – 2017-11-20 (×3): 5000 [IU] via ORAL
  Filled 2017-11-16 (×3): qty 5

## 2017-11-16 MED ORDER — DOCUSATE SODIUM 100 MG PO CAPS
100.0000 mg | ORAL_CAPSULE | Freq: Two times a day (BID) | ORAL | Status: DC
  Administered 2017-11-16 – 2017-11-17 (×4): 100 mg via ORAL
  Filled 2017-11-16 (×6): qty 1

## 2017-11-16 MED ORDER — ACETAMINOPHEN 650 MG RE SUPP
650.0000 mg | Freq: Four times a day (QID) | RECTAL | Status: DC | PRN
  Filled 2017-11-16: qty 1

## 2017-11-16 MED ORDER — CLOZAPINE 100 MG PO TABS
100.0000 mg | ORAL_TABLET | Freq: Two times a day (BID) | ORAL | Status: DC
  Administered 2017-11-16 – 2017-11-17 (×2): 100 mg via ORAL
  Filled 2017-11-16 (×3): qty 1

## 2017-11-16 MED ORDER — ACETAMINOPHEN 325 MG PO TABS: 650.0000 mg | ORAL_TABLET | Freq: Four times a day (QID) | ORAL | Status: DC | PRN

## 2017-11-16 MED ORDER — HYDROCODONE-ACETAMINOPHEN 5-325 MG PO TABS: 1.0000 | ORAL_TABLET | ORAL | Status: DC | PRN

## 2017-11-16 MED ORDER — DIVALPROEX SODIUM ER 500 MG PO TB24
1000.0000 mg | ORAL_TABLET | Freq: Every day | ORAL | Status: DC
  Administered 2017-11-16 (×2): 1000 mg via ORAL
  Filled 2017-11-16 (×5): qty 2

## 2017-11-16 MED ORDER — DOCUSATE SODIUM 100 MG PO CAPS: 100.0000 mg | ORAL_CAPSULE | Freq: Two times a day (BID) | ORAL | Status: DC

## 2017-11-16 NOTE — ED Notes (Signed)
Password from sister [place]

## 2017-11-16 NOTE — Evaluation (Signed)
Physical Therapy Evaluation Patient Details Name: Austin Gates MRN: [id number] DOB: 09/23/1960 Today's Date: 11/16/2017   History of Present Illness  Pt is a 57 y.o. male with a known history of tobacco abuse, psychiatric disorder and mental retardation.  Patient lives in an assisted living. He is not able to provide reliable history due to his mental status. Patient was sent to emergency room for increased drowsiness, confusion, generalized weakness and low blood pressure.  Most of his symptoms have been going on for the past 5 to 7 days, gradually getting worse.  No reports of fever, chills, nausea, vomiting, diarrhea, bleeding.  In the emergency room, patient was noted with systolic blood pressure in 70s.  This finally improved after 4 L of fluids to 80s.  Blood test done emergency room were notable for sodium level of 148, creatinine level 1.57 and BUN  44.  UA was negative for UTI and chest x-ray was negative for acute cardiopulmonary disease.  Abdominal x-ray shows large stool burden. Patient is admitted for further evaluation and treatment.  Assessment includes: Acute renal failure, likely prerenal, related to dehydration and possible side effects from medications, Acute encephalopathy, hypotension, and constipation.     Clinical Impression  Pt presents with deficits in strength, transfers, mobility, gait, balance, and activity tolerance.  Pt's BP taken at rest in supine at 93/65.  BP taken again in sitting at 98/68.  BP attempted to be taken in standing with pt unable to relax the arm, reading unable to be obtained.  Pt required extra time and effort with bed mobility tasks but no physical assistance.  Pt +2 Min A with sit to/from stand transfers for safety.  Pt's BLEs tremulous upon initial stand but improved after standing for 5-10 sec; upon initiating amb pt's BLEs became tremulous again and pt verbalized needing to sit and required assistance to return to the EOB.  Pt impulsive and attempted  to sit prior to being close enough to the bed to sit safely.  Pt presents with significant functional deficits compared to pt's baseline as communicated by pt's sister.  Pt will benefit from PT services in a SNF setting upon discharge to safely address above deficits for decreased caregiver assistance and eventual return to PLOF.     Follow Up Recommendations SNF;Supervision for mobility/OOB    Equipment Recommendations  Rolling walker with 5" wheels;Other (comment)(TBD at next venue of care if pt discharges to a SNF)    Recommendations for Other Services       Precautions / Restrictions Precautions Precautions: Fall Precaution Comments: Watch for low BP Restrictions Weight Bearing Restrictions: No      Mobility  Bed Mobility Overal bed mobility: Modified Independent             General bed mobility comments: Extra time and effort but no physical assistance required  Transfers Overall transfer level: Needs assistance Equipment used: Rolling walker (2 wheeled) Transfers: Sit to/from Stand Sit to Stand: +2 physical assistance;+2 safety/equipment;Min assist         General transfer comment: Min assist secondary to posterior instability upon initial stand  Ambulation/Gait Ambulation/Gait assistance: Mod assist;+2 physical assistance Gait Distance (Feet): 4 Feet Assistive device: Rolling walker (2 wheeled) Gait Pattern/deviations: Step-through pattern;Decreased step length - right;Decreased step length - left;Trunk flexed;Shuffle Gait velocity: Decreased   General Gait Details: Pt tremulous upon initial stand but improved after standing for 5-10 sec; upon initiating amb pt's BLEs became tremulous again and pt verbalized needing to sit and required  assistance to return to the EOB  Stairs            Wheelchair Mobility    Modified Rankin (Stroke Patients Only)       Balance Overall balance assessment: Needs assistance Sitting-balance support: Bilateral  upper extremity supported;Feet supported Sitting balance-Leahy Scale: Fair     Standing balance support: Bilateral upper extremity supported Standing balance-Leahy Scale: Poor Standing balance comment: Posterior instability in standing requiring occasional assist to prevent LOB                             Pertinent Vitals/Pain Pain Assessment: No/denies pain    Home Living Family/patient expects to be discharged to:: Assisted living(History provided by pt's sister in room secondary to pt's cognitive deficits )               Home Equipment: None      Prior Function Level of Independence: Needs assistance   Gait / Transfers Assistance Needed: Ind amb facility distances without an AD, multiple recent falls related to current onset of weakness but rarely falls at baseline  ADL's / Homemaking Assistance Needed: Ind with bathing and dressing, ALF staff assists with meals and meds        Hand Dominance        Extremity/Trunk Assessment   Upper Extremity Assessment Upper Extremity Assessment: Generalized weakness    Lower Extremity Assessment Lower Extremity Assessment: Generalized weakness       Communication   Communication: Expressive difficulties;Other (comment)(Limited by cognition)  Cognition Arousal/Alertness: Awake/alert Behavior During Therapy: Flat affect Overall Cognitive Status: History of cognitive impairments - at baseline                                        General Comments      Exercises Total Joint Exercises Ankle Circles/Pumps: AROM;AAROM;Both;5 reps;10 reps Quad Sets: Strengthening;Both;10 reps Short Arc Quad: AROM;Both;10 reps Heel Slides: AROM;Both;5 reps Hip ABduction/ADduction: AROM;Both;10 reps Straight Leg Raises: AROM;Both;10 reps Long Arc Quad: AROM;Both;10 reps   Assessment/Plan    PT Assessment Patient needs continued PT services  PT Problem List Decreased strength;Decreased activity  tolerance;Decreased balance;Decreased knowledge of use of DME;Decreased mobility       PT Treatment Interventions DME instruction;Gait training;Functional mobility training;Balance training;Therapeutic exercise;Therapeutic activities;Patient/family education    PT Goals (Current goals can be found in the Care Plan section)  Acute Rehab PT Goals Patient Stated Goal: To get stronger and walk better with improved balance prior to return to ALF PT Goal Formulation: With family Time For Goal Achievement: 11/29/17 Potential to Achieve Goals: Fair    Frequency Min 2X/week   Barriers to discharge Decreased caregiver support      Co-evaluation               AM-PAC PT "6 Clicks" Daily Activity  Outcome Measure Difficulty turning over in bed (including adjusting bedclothes, sheets and blankets)?: A Little Difficulty moving from lying on back to sitting on the side of the bed? : A Little Difficulty sitting down on and standing up from a chair with arms (e.g., wheelchair, bedside commode, etc,.)?: Unable Help needed moving to and from a bed to chair (including a wheelchair)?: A Lot Help needed walking in hospital room?: Total Help needed climbing 3-5 steps with a railing? : Total 6 Click Score: 11    End  of Session Equipment Utilized During Treatment: Gait belt Activity Tolerance: Patient limited by fatigue Patient left: in bed;with bed alarm set;with call bell/phone within reach;with family/visitor present Nurse Communication: Mobility status PT Visit Diagnosis: Unsteadiness on feet (R26.81);Muscle weakness (generalized) (M62.81);Difficulty in walking, not elsewhere classified (R26.2);Repeated falls (R29.6)    Time: 1330-1409 PT Time Calculation (min) (ACUTE ONLY): 39 min   Charges:   PT Evaluation $PT Eval Low Complexity: 1 Low PT Treatments $Therapeutic Exercise: 8-22 mins        D. Royetta Asal PT, DPT 11/16/17, 2:35 PM

## 2017-11-16 NOTE — Progress Notes (Signed)
[place] for clozapine monitoring  No Known Allergies  Vital Signs: BP: 93/65 (09/26 1330) Pulse Rate: 67 (09/26 1330) Intake/Output from previous day: 09/25 0701 - 09/26 0700 In: 2766.5 [I.V.:1766.5; IV Piggyback:1000] Out: -  Intake/Output from this shift: Total I/O In: 240 [P.O.:240] Out: -   Labs: Recent Labs    11/15/17 1622 11/16/17 0414 11/16/17 1031  WBC 7.9 5.8 DUPLICATE REQUEST  HGB 31.4* 97.0* DUPLICATE REQUEST  HCT 26.3* 78.5* DUPLICATE REQUEST  PLT 96* 76* DUPLICATE REQUEST  CREATININE 1.57* 1.38*  --    Estimated Creatinine Clearance: 61.3 mL/min (A) (by C-G formula based on SCr of 1.38 mg/dL (H)).   Assessment: ANC:   9/26 = 5200/uL  Goal of Therapy:  Weekly Clozaril monitoring   Plan:  Pt eligible to dispense clozaril ,  Will recheck [place] on 10/3 with AM labs.   Dallie Piles, PharmD 11/16/2017,2:23 PM

## 2017-11-16 NOTE — Consult Note (Signed)
[facility] Consult   Reason for Consult: Consult for this 57 year old man with chronic mental health problems in the hospital with weakness and renal insufficiency Referring Physician: Anselm Jungling Patient Identification: Jamyron Redd MRN:  [id number] Principal Diagnosis: Schizoaffective disorder, bipolar type [facility]) Diagnosis:   Patient Active Problem List   Diagnosis Date Noted  . ARF (acute renal failure) ([place]) [N17.9] 11/15/2017  . Elevated lithium level [R79.89] 11/06/2017  . Fall [W19.XXXA] 11/06/2017  . Schizoaffective disorder, bipolar type ([place]) [F25.0] 10/04/2016  . Tobacco use disorder [F17.200] 09/30/2016  . 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 male patient admitted with "I do not know".  HPI: Patient seen chart reviewed.  Patient familiar from previous hospitalizations.  57 year old man with intellectual disability and a diagnosis of schizoaffective disorder.  Had recently been in the hospital with lithium toxicity as a cause for falling and we discontinued the lithium.  Comes back to the hospital with fatigue and altered mental status again.  Patient is showing some signs of renal impairment.  Appears to be dehydrated.  Extremely constipated.  On interview this afternoon the patient was not able to give me any history at all.  He knew he was in the hospital but could not tell me why he was in the hospital.  Did not have any specific complaints.  He was not agitated and did not report any psychotic symptoms.  Social history: Lives in a group home has a guardian.  Medical history: Acute renal failure  Substance abuse history: None  Past Psychiatric History: Patient has a long history of mental health problems and behavior problems.  Lots of behavior agitation when he was younger.  As a result was on higher doses of lithium Depakote and clozapine.  Had  previously been stable on these.  We discontinue the lithium recently because of the lithium toxicity.  Patient is currently not agitated not threatening not violent.  Not showing obvious psychotic symptoms.  Cognitively impaired which is chronic for him.  Risk to Self:   Risk to Others:   Prior Inpatient Therapy:   Prior Outpatient Therapy:    Past Medical History:  Past Medical History:  Diagnosis Date  . Diabetes mellitus without complication ([place])   . Hypertension     Past Surgical History:  Procedure Laterality Date  . KNEE ARTHROSCOPY     Family History:  Family History  Problem Relation Age of Onset  . Diabetes Mother    Family Psychiatric  History: See previous notes.  Nothing really contributory known. Social History:  Social History   Substance and Sexual Activity  Alcohol Use No     Social History   Substance and Sexual Activity  Drug Use No    Social History   Socioeconomic History  . Marital status: Single    Spouse name: Not on file  . Number of children: Not on file  . Years of education: Not on file  . Highest education level: Not on file  Occupational History  . Not on file  Social Needs  . Financial resource strain: Not on file  . Food insecurity:    Worry: Not on file    Inability: Not on file  . Transportation needs:    Medical: Not on file    Non-medical: Not on file  Tobacco Use  . Smoking status: Current Every Day Smoker  Packs/day: 1.00    Types: Cigarettes  . Smokeless tobacco: Never Used  Substance and Sexual Activity  . Alcohol use: No  . Drug use: No  . Sexual activity: Never  Lifestyle  . Physical activity:    Days per week: Not on file    Minutes per session: Not on file  . Stress: Not on file  Relationships  . Social connections:    Talks on phone: Not on file    Gets together: Not on file    Attends religious service: Not on file    Active member of club or organization: Not on file    Attends meetings of clubs  or organizations: Not on file    Relationship status: Not on file  Other Topics Concern  . Not on file  Social History Narrative  . Not on file   Additional Social History:    Allergies:  No Known Allergies  Labs:  Results for orders placed or performed during the hospital encounter of 11/15/17 (from the past 48 hour(s))  Basic metabolic panel     Status: Abnormal   Collection Time: 11/15/17  4:22 PM  Result Value Ref Range   Sodium 148 (H) 135 - 145 mmol/L   Potassium 4.7 3.5 - 5.1 mmol/L   Chloride 110 98 - 111 mmol/L   CO2 28 22 - 32 mmol/L   Glucose, Bld 102 (H) 70 - 99 mg/dL   BUN 44 (H) 6 - 20 mg/dL   Creatinine, Ser 1.57 (H) 0.61 - 1.24 mg/dL   Calcium 9.8 8.9 - 10.3 mg/dL   GFR calc non Af Amer 48 (L) >60 mL/min   GFR calc Af Amer 56 (L) >60 mL/min    Comment: (NOTE) The eGFR has been calculated using the CKD EPI equation. This calculation has not been validated in all clinical situations. eGFR's persistently <60 mL/min signify possible Chronic Kidney Disease.    Anion gap 10 5 - 15    Comment: Performed at [facility], [address] [place], [place] [address]  CBC     Status: Abnormal   Collection Time: 11/15/17  4:22 PM  Result Value Ref Range   WBC 7.9 3.8 - 10.6 K/uL   RBC 3.68 (L) 4.40 - 5.90 MIL/uL   Hemoglobin 12.0 (L) 13.0 - 18.0 g/dL   HCT 34.7 (L) 40.0 - 52.0 %   MCV 94.4 80.0 - 100.0 fL   MCH 32.7 26.0 - 34.0 pg   MCHC 34.6 32.0 - 36.0 g/dL   RDW 15.0 (H) 11.5 - 14.5 %   Platelets 96 (L) 150 - 440 K/uL    Comment: Performed at [facility], [address] [place], [place] [address]  Troponin I     Status: None   Collection Time: 11/15/17  4:22 PM  Result Value Ref Range   Troponin I <0.03 <0.03 ng/mL    Comment: Performed at [facility], [address] [place], [place] [address]  Lithium level     Status: Abnormal   Collection Time: 11/15/17  4:22 PM  Result Value Ref Range   Lithium Lvl 0.26 (L) 0.60 -  1.20 mmol/L    Comment: Performed at [facility], [address] [place],  [address]  Urinalysis, Complete w Microscopic     Status: Abnormal   Collection Time: 11/15/17  7:09 PM  Result Value Ref Range   Color, Urine YELLOW (A) YELLOW   APPearance CLEAR (A) CLEAR   Specific  Gravity, Urine 1.024 1.005 - 1.030   pH 5.0 5.0 - 8.0   Glucose, UA NEGATIVE NEGATIVE mg/dL   Hgb urine dipstick NEGATIVE NEGATIVE   Bilirubin Urine NEGATIVE NEGATIVE   Ketones, ur 5 (A) NEGATIVE mg/dL   Protein, ur NEGATIVE NEGATIVE mg/dL   Nitrite NEGATIVE NEGATIVE   Leukocytes, UA NEGATIVE NEGATIVE   RBC / HPF 0-5 0 - 5 RBC/hpf   WBC, UA 0-5 0 - 5 WBC/hpf   Bacteria, UA NONE SEEN NONE SEEN   Squamous Epithelial / LPF NONE SEEN 0 - 5   Mucus PRESENT     Comment: Performed at [facility], [address] [place], [place] [address]  Basic metabolic panel     Status: Abnormal   Collection Time: 11/16/17  4:14 AM  Result Value Ref Range   Sodium 146 (H) 135 - 145 mmol/L   Potassium 4.4 3.5 - 5.1 mmol/L   Chloride 113 (H) 98 - 111 mmol/L   CO2 26 22 - 32 mmol/L   Glucose, Bld 92 70 - 99 mg/dL   BUN 36 (H) 6 - 20 mg/dL   Creatinine, Ser 1.38 (H) 0.61 - 1.24 mg/dL   Calcium 8.6 (L) 8.9 - 10.3 mg/dL   GFR calc non Af Amer 56 (L) >60 mL/min   GFR calc Af Amer >60 >60 mL/min    Comment: (NOTE) The eGFR has been calculated using the CKD EPI equation. This calculation has not been validated in all clinical situations. eGFR's persistently <60 mL/min signify possible Chronic Kidney Disease.    Anion gap 7 5 - 15    Comment: Performed at [facility], [address] [place], [place] [address]  CBC     Status: Abnormal   Collection Time: 11/16/17  4:14 AM  Result Value Ref Range   WBC 5.8 3.8 - 10.6 K/uL   RBC 3.32 (L) 4.40 - 5.90 MIL/uL   Hemoglobin 10.8 (L) 13.0 - 18.0 g/dL   HCT 32.2 (L) 40.0 - 52.0 %   MCV 97.0 80.0 - 100.0 fL   MCH 32.5 26.0 - 34.0 pg   MCHC 33.5  32.0 - 36.0 g/dL   RDW 15.7 (H) 11.5 - 14.5 %   Platelets 76 (L) 150 - 440 K/uL    Comment: Performed at [facility], [address] [place], [place] [address]  Differential     Status: Abnormal   Collection Time: 11/16/17  4:14 AM  Result Value Ref Range   Neutrophils Relative % 90 %   Neutro Abs 5.2 1.4 - 6.5 K/uL   Lymphocytes Relative 5 %   Lymphs Abs 0.3 (L) 1.0 - 3.6 K/uL   Monocytes Relative 4 %   Monocytes Absolute 0.2 0.2 - 1.0 K/uL   Eosinophils Relative 1 %   Eosinophils Absolute 0.0 0 - 0.7 K/uL   Basophils Relative 0 %   Basophils Absolute 0.0 0 - 0.1 K/uL    Comment: Performed at [facility], [address] [place],  [address]  Glucose, capillary     Status: None   Collection Time: 11/16/17  7:35 AM  Result Value Ref Range   Glucose-Capillary 83 70 - 99 mg/dL  CBC with Differential/Platelet     Status: None (Preliminary result)   Collection Time: 11/16/17 10:31 AM  Result Value Ref Range   WBC DUPLICATE REQUEST K/uL   RBC DUPLICATE REQUEST MIL/uL   Hemoglobin DUPLICATE REQUEST g/dL   HCT DUPLICATE REQUEST %  MCV DUPLICATE REQUEST fL   MCH DUPLICATE REQUEST pg   MCHC DUPLICATE REQUEST g/dL   RDW DUPLICATE REQUEST %   Platelets DUPLICATE REQUEST K/uL   Neutrophils Relative % PENDING %   Neutro Abs PENDING K/uL   Band Neutrophils PENDING %   Lymphocytes Relative PENDING %   Lymphs Abs PENDING K/uL   Monocytes Relative PENDING %   Monocytes Absolute PENDING K/uL   Eosinophils Relative PENDING %   Eosinophils Absolute PENDING K/uL   Basophils Relative PENDING %   Basophils Absolute PENDING K/uL   WBC Morphology PENDING    RBC Morphology PENDING    Smear Review PENDING    Other PENDING %   nRBC PENDING /100 WBC   Metamyelocytes Relative PENDING %   Myelocytes PENDING %   Promyelocytes Relative PENDING %   Blasts PENDING %    Current Facility-Administered Medications  Medication Dose Route Frequency Provider Last Rate  Last Dose  . 0.9 %  sodium chloride infusion   Intravenous Continuous Amelia Jo, MD 125 mL/hr at 11/16/17 1759    . acetaminophen (TYLENOL) tablet 650 mg  650 mg Oral Q6H PRN Amelia Jo, MD       Or  . acetaminophen (TYLENOL) suppository 650 mg  650 mg Rectal Q6H PRN Amelia Jo, MD      . bisacodyl (DULCOLAX) EC tablet 5 mg  5 mg Oral Daily PRN Amelia Jo, MD      . cholecalciferol (VITAMIN D) tablet 5,000 Units  5,000 Units Oral Daily Amelia Jo, MD   5,000 Units at 11/16/17 1047  . cloZAPine (CLOZARIL) tablet 100 mg  100 mg Oral BID Amelia Jo, MD      . divalproex (DEPAKOTE ER) 24 hr tablet 1,000 mg  1,000 mg Oral QHS Amelia Jo, MD   1,000 mg at 11/16/17 0101  . docusate sodium (COLACE) capsule 100 mg  100 mg Oral BID Amelia Jo, MD   100 mg at 11/16/17 1047  . heparin injection 5,000 Units  5,000 Units Subcutaneous Q8H Amelia Jo, MD   5,000 Units at 11/16/17 1422  . HYDROcodone-acetaminophen (NORCO/VICODIN) 5-325 MG per tablet 1-2 tablet  1-2 tablet Oral Q4H PRN Amelia Jo, MD      . midodrine (PROAMATINE) tablet 5 mg  5 mg Oral TID WC Dustin Flock, MD   5 mg at 11/16/17 1707  . ondansetron (ZOFRAN) tablet 4 mg  4 mg Oral Q6H PRN Amelia Jo, MD       Or  . ondansetron [facility]) injection 4 mg  4 mg Intravenous Q6H PRN Amelia Jo, MD      . polyethylene glycol (MIRALAX / GLYCOLAX) packet 17 g  17 g Oral Darl Householder, MD   17 g at 11/16/17 1048    Musculoskeletal: Strength & Muscle Tone: decreased Gait & Station: unsteady Patient leans: N/A  Psychiatric Specialty Exam: Physical Exam  Nursing note and vitals reviewed. Constitutional: He appears well-developed and well-nourished.  HENT:  Head: Normocephalic and atraumatic.  Eyes: Pupils are equal, round, and reactive to light. Conjunctivae are normal.  Neck: Normal range of motion.  Cardiovascular: Regular rhythm and normal heart sounds.  Respiratory: Effort normal. No respiratory  distress.  GI: Soft.  Musculoskeletal: Normal range of motion.  Neurological: He is alert.  Skin: Skin is warm and dry.  Psychiatric: His affect is blunt. His speech is delayed. He is slowed. Thought content is not paranoid. Cognition and memory are impaired. He expresses inappropriate judgment. He expresses  no homicidal and no suicidal ideation. He is noncommunicative.    Review of Systems  Constitutional: Negative.   HENT: Negative.   Eyes: Negative.   Respiratory: Negative.   Cardiovascular: Negative.   Gastrointestinal: Negative.   Musculoskeletal: Negative.   Skin: Negative.   Neurological: Negative.   Psychiatric/Behavioral: Negative.     Blood pressure 93/65, pulse 67, temperature (!) 97.3 F (36.3 C), temperature source Oral, resp. rate 16, height '6\' 1"'  (1.854 m), weight 71.7 kg, SpO2 100 %.Body mass index is 20.85 kg/m.  General Appearance: Casual  Eye Contact:  Fair  Speech:  Slow and Slurred  Volume:  Decreased  Mood:  Euthymic  Affect:  Constricted  Thought Process:  Disorganized  Orientation:  Full (Time, Place, and Person)  Thought Content:  Rumination  Suicidal Thoughts:  No  Homicidal Thoughts:  No  Memory:  Immediate;   Fair Recent;   Poor Remote;   Poor  Judgement:  Impaired  Insight:  Shallow  Psychomotor Activity:  Decreased  Concentration:  Concentration: Fair  Recall:  [employer] of Knowledge:  Fair  Language:  Fair  Akathisia:  No  Handed:  Right  AIMS (if indicated):     Assets:  Desire for Improvement Housing  ADL's:  Impaired  Cognition:  Impaired,  Moderate  Sleep:        Treatment Plan Summary: Medication management and Plan Patient's mental state to me this evening seems about baseline.  He knows he is in the hospital.  He is not delirious but he really cannot explain much of anything about his medical or psychiatric condition.  He is not reporting hallucinations.  He is not agitated or aggressive.  I see in the notes that his  clozapine has already been cut down in dosage as has been his Depakote.  I think all of that is fine.  The clozapine is probably a major contributor to his constipation although he has been on it for quite a while and not sure why he would be getting more constipated with that.  In any case I do not have any reason to want to add any more psychiatric medicine at this point given that he is behaving fine.  We will just monitor as he is in the hospital.  Disposition: No evidence of imminent risk to self or others at present.   Patient does not meet criteria for psychiatric inpatient admission. Supportive therapy provided about ongoing stressors.  Alethia Berthold, MD 11/16/2017 7:44 PM

## 2017-11-16 NOTE — Clinical Social Work Note (Signed)
CSW consulted for patient coming from a group home. Patient is not from a group home but is from an ALF. This would be treated as a home discharge. Shela Leff MSW,LCSW [phone]

## 2017-11-16 NOTE — Progress Notes (Signed)
[place] at [facility]                                                                                                                                                                                  Patient Demographics   Austin Gates, is a 57 y.o. male, DOB - 11-05-1960, SAY:[id number]  Admit date - 11/15/2017   Admitting Physician Amelia Jo, MD  Outpatient Primary MD for the patient is Raelyn Number, MD   LOS - 1  Subjective: Patient has schizoaffective disorder, bipolar type who is on multiple psychiatric medications brought to the hospital with difficulty with ambulation as well as hypotension Patient's sister is at the bedside he is not able to provide me any review of systems.  She states that he was seen in the ED recently and was noted to have a very high lithium level therefore his lithium was discontinued.  Since he is been discharged he continues to have trouble with walking.  Normally he does require assistance but now this is much different.  In the ER he was noted to have hypotension and some renal failure.   Review of Systems:   CONSTITUTIONAL: Patient unable to provide any review of systems  Vitals:   Vitals:   11/16/17 0031 11/16/17 0500 11/16/17 0610 11/16/17 1152  BP: (!) 88/69  92/75 95/77  Pulse: 62  61 65  Resp: 20  16   Temp: (!) 97.3 F (36.3 C)     TempSrc: Oral     SpO2: 100%  100% 100%  Weight: 70.3 kg 71.7 kg    Height:        Wt Readings from Last 3 Encounters:  11/16/17 71.7 kg  11/06/17 65.8 kg  10/13/17 72.6 kg     Intake/Output Summary (Last 24 hours) at 11/16/2017 1345 Last data filed at 11/16/2017 0700 Gross per 24 hour  Intake 2766.48 ml  Output -  Net 2766.48 ml    Physical Exam:   GENERAL: Pleasant-appearing in no apparent distress.  HEAD, EYES, EARS, NOSE AND THROAT: Atraumatic, normocephalic. Extraocular muscles are intact. Pupils equal and reactive to light. Sclerae anicteric. No  conjunctival injection. No oro-pharyngeal erythema.  NECK: Supple. There is no jugular venous distention. No bruits, no lymphadenopathy, no thyromegaly.  HEART: Regular rate and rhythm,. No murmurs, no rubs, no clicks.  LUNGS: Clear to auscultation bilaterally. No rales or rhonchi. No wheezes.  ABDOMEN: Soft, flat, nontender, nondistended. Has good bowel sounds. No hepatosplenomegaly appreciated.  EXTREMITIES: No evidence of any cyanosis, clubbing, or peripheral edema.  +2 pedal and radial pulses bilaterally.  NEUROLOGIC: The patient is alert, not oriented SKIN: Moist and warm  with no rashes appreciated.  Psych: Not anxious, depressed LN: No inguinal LN enlargement    Antibiotics   Anti-infectives (From admission, onward)   None      Medications   Scheduled Meds: . cholecalciferol  5,000 Units Oral Daily  . cloZAPine  100 mg Oral BID  . divalproex  1,000 mg Oral QHS  . docusate sodium  100 mg Oral BID  . heparin  5,000 Units Subcutaneous Q8H  . midodrine  5 mg Oral TID WC  . polyethylene glycol  17 g Oral QODAY   Continuous Infusions: . sodium chloride 125 mL/hr at 11/16/17 0842   PRN Meds:.acetaminophen **OR** acetaminophen, bisacodyl, HYDROcodone-acetaminophen, ondansetron **OR** ondansetron (ZOFRAN) IV   Data Review:   Micro Results No results found for this or any previous visit (from the past 240 hour(s)).  Radiology Reports Dg Chest 2 View  Result Date: 11/15/2017 CLINICAL DATA:  Weakness, leg pain. EXAM: CHEST - 2 VIEW COMPARISON:  None. FINDINGS: The heart size and mediastinal contours are within normal limits. Both lungs are clear. No pneumothorax or pleural effusion is noted. The visualized skeletal structures are unremarkable. IMPRESSION: No active cardiopulmonary disease. Electronically Signed   By: Marijo Conception, M.D.   On: 11/15/2017 16:55   Ct Head Wo Contrast  Result Date: 11/06/2017 CLINICAL DATA:  Ataxia, head trauma. EXAM: CT HEAD WITHOUT CONTRAST  TECHNIQUE: Contiguous axial images were obtained from the base of the skull through the vertex without intravenous contrast. COMPARISON:  09/17/2016 FINDINGS: Brain: No acute intracranial abnormality. Specifically, no hemorrhage, hydrocephalus, mass lesion, acute infarction, or significant intracranial injury. Vascular: No hyperdense vessel or unexpected calcification. Skull: No acute calvarial abnormality. Sinuses/Orbits: Visualized paranasal sinuses and mastoids clear. Orbital soft tissues unremarkable. Other: None IMPRESSION: No acute intracranial abnormality. Electronically Signed   By: Rolm Baptise M.D.   On: 11/06/2017 14:56   Dg Abd 2 Views  Result Date: 11/15/2017 CLINICAL DATA:  Distention EXAM: ABDOMEN - 2 VIEW COMPARISON:  None. FINDINGS: Large stool burden throughout the colon. There is normal bowel gas pattern. No free air. No organomegaly or suspicious calcification. No acute bony abnormality. Visualized lung bases clear. IMPRESSION: Large stool burden.  No acute findings. Electronically Signed   By: Rolm Baptise M.D.   On: 11/15/2017 18:40     CBC Recent Labs  Lab 11/15/17 1622 11/16/17 0414 11/16/17 1031  WBC 7.9 5.8 DUPLICATE REQUEST  HGB 63.1* 49.7* DUPLICATE REQUEST  HCT 02.6* 37.8* DUPLICATE REQUEST  PLT 96* 76* DUPLICATE REQUEST  MCV 58.8 50.2 DUPLICATE REQUEST  MCH 77.4 12.8 DUPLICATE REQUEST  MCHC 78.6 76.7 DUPLICATE REQUEST  RDW 20.9* 47.0* DUPLICATE REQUEST  LYMPHSABS  --  0.3* PENDING  MONOABS  --  0.2 PENDING  EOSABS  --  0.0 PENDING  BASOSABS  --  0.0 PENDING    Chemistries  Recent Labs  Lab 11/15/17 1622 11/16/17 0414  NA 148* 146*  K 4.7 4.4  CL 110 113*  CO2 28 26  GLUCOSE 102* 92  BUN 44* 36*  CREATININE 1.57* 1.38*  CALCIUM 9.8 8.6*   ------------------------------------------------------------------------------------------------------------------ estimated creatinine clearance is 61.3 mL/min (A) (by C-G formula based on SCr of 1.38 mg/dL  (H)). ------------------------------------------------------------------------------------------------------------------ No results for input(s): HGBA1C in the last 72 hours. ------------------------------------------------------------------------------------------------------------------ No results for input(s): CHOL, HDL, LDLCALC, TRIG, CHOLHDL, LDLDIRECT in the last 72 hours. ------------------------------------------------------------------------------------------------------------------ No results for input(s): TSH, T4TOTAL, T3FREE, THYROIDAB in the last 72 hours.  Invalid input(s): FREET3 ------------------------------------------------------------------------------------------------------------------ No results for input(s):  VITAMINB12, FOLATE, FERRITIN, TIBC, IRON, RETICCTPCT in the last 72 hours.  Coagulation profile No results for input(s): INR, PROTIME in the last 168 hours.  No results for input(s): DDIMER in the last 72 hours.  Cardiac Enzymes Recent Labs  Lab 11/15/17 1622  TROPONINI <0.03   ------------------------------------------------------------------------------------------------------------------ Invalid input(s): POCBNP    Assessment & Plan   1.  Acute renal failure, likely prerenal, related to dehydration and possible side effects from medications.  Continue IV fluids. Adjustment of meds based on his renal function -We will lower Clozaril from 500 mg a day to 200 mg a day -We will lower Depakote from 1500 mg a day to 1000 mg a day -We will D/C desmopressin  2.  Acute encephalopathy, likely related to ARF and possible side effects from medications.    Psych eval.  3.  Hypotension, likely related to dehydration and possible side effects from medications.    Start patient on midodrine  4.  Constipation, likely related to dehydration and possible side effects from medications.  Continue IV fluids, stool softeners and lower psych medications doses.  5.   Schizoaffective disorder, will continue psych medications at lower doses, as mentioned above under #1.  Monitor patient clinically closely.  6.  Bipolar disorder, will continue psych medications at lower doses, as mentioned above under #1.  Monitor patient clinically closely.  Psych input to help adjust medication  7.  Tobacco abuse.  Smoking cessation was provided      Code Status Orders  (From admission, onward)         Start     Ordered   11/16/17 0019  Full code  Continuous     11/16/17 0018        Code Status History    Date Active Date Inactive Code Status Order ID Comments User Context   09/29/2016 2043 10/07/2016 1514 Full Code [id number]  Gonzella Lex, MD Inpatient   09/17/2016 1901 09/18/2016 1556 Full Code [id number]  Demetrios Loll, MD Inpatient           Consults psychiatry  DVT Prophylaxis  Lovenox  Lab Results  Component Value Date   PLT DUPLICATE REQUEST 76/54/6503     Time Spent in minutes 35 minutes greater than 50% of time spent in care coordination and counseling patient regarding the condition and plan of care.   Dustin Flock M.D on 11/16/2017 at 1:45 PM  Between 7am to 6pm - Pager - [phone]  After 6pm go to [email or web address] - Proofreader  [employer]   Office  [phone]

## 2017-11-17 ENCOUNTER — Inpatient Hospital Stay: Payer: Medicaid Other

## 2017-11-17 DIAGNOSIS — R4182 Altered mental status, unspecified: Secondary | ICD-10-CM

## 2017-11-17 LAB — BASIC METABOLIC PANEL
ANION GAP: 5 (ref 5–15)
BUN: 29 mg/dL — AB (ref 6–20)
CHLORIDE: 115 mmol/L — AB (ref 98–111)
CO2: 27 mmol/L (ref 22–32)
Calcium: 8.9 mg/dL (ref 8.9–10.3)
Creatinine, Ser: 1.09 mg/dL (ref 0.61–1.24)
GFR calc Af Amer: >60 mL/min (ref >60)
GFR calc non Af Amer: >60 mL/min (ref >60)
GLUCOSE: 67 mg/dL — AB (ref 70–99)
POTASSIUM: 4.4 mmol/L (ref 3.5–5.1)
Sodium: 147 mmol/L — ABNORMAL HIGH (ref 135–145)

## 2017-11-17 LAB — GLUCOSE, CAPILLARY
GLUCOSE-CAPILLARY: 68 mg/dL — AB (ref 70–99)
Glucose-Capillary: 121 mg/dL — ABNORMAL HIGH (ref 70–99)
Glucose-Capillary: 160 mg/dL — ABNORMAL HIGH (ref 70–99)

## 2017-11-17 LAB — COMPREHENSIVE METABOLIC PANEL
ALBUMIN: 2.6 g/dL — AB (ref 3.5–5.0)
ALT: 24 U/L (ref 0–44)
AST: 39 U/L (ref 15–41)
Alkaline Phosphatase: 62 U/L (ref 38–126)
Anion gap: 5 (ref 5–15)
BUN: 27 mg/dL — AB (ref 6–20)
CHLORIDE: 114 mmol/L — AB (ref 98–111)
CO2: 27 mmol/L (ref 22–32)
Calcium: 8.9 mg/dL (ref 8.9–10.3)
Creatinine, Ser: 1.09 mg/dL (ref 0.61–1.24)
GFR calc Af Amer: >60 mL/min (ref >60)
GLUCOSE: 154 mg/dL — AB (ref 70–99)
POTASSIUM: 4.1 mmol/L (ref 3.5–5.1)
SODIUM: 146 mmol/L — AB (ref 135–145)
Total Bilirubin: 0.5 mg/dL (ref 0.3–1.2)
Total Protein: 5.5 g/dL — ABNORMAL LOW (ref 6.5–8.1)

## 2017-11-17 LAB — CBC
HEMATOCRIT: 33.4 % — AB (ref 40.0–52.0)
HEMOGLOBIN: 11.6 g/dL — AB (ref 13.0–18.0)
MCH: 33 pg (ref 26.0–34.0)
MCHC: 34.7 g/dL (ref 32.0–36.0)
MCV: 95.2 fL (ref 80.0–100.0)
Platelets: 70 10*3/uL — ABNORMAL LOW (ref 150–440)
RBC: 3.51 MIL/uL — ABNORMAL LOW (ref 4.40–5.90)
RDW: 15.5 % — AB (ref 11.5–14.5)
WBC: 3.1 10*3/uL — ABNORMAL LOW (ref 3.8–10.6)

## 2017-11-17 LAB — SEDIMENTATION RATE: SED RATE: 13 mm/h (ref 0–20)

## 2017-11-17 LAB — AMMONIA: AMMONIA: 24 umol/L (ref 9–35)

## 2017-11-17 LAB — HIV ANTIBODY (ROUTINE TESTING W REFLEX): HIV SCREEN 4TH GENERATION: NONREACTIVE

## 2017-11-17 LAB — BLOOD GAS, ARTERIAL
Acid-Base Excess: 3.1 mmol/L — ABNORMAL HIGH (ref 0.0–2.0)
Bicarbonate: 29 mmol/L — ABNORMAL HIGH (ref 20.0–28.0)
FIO2: 0.28
O2 Saturation: 98.7 %
PATIENT TEMPERATURE: 37
PH ART: 7.38 (ref 7.350–7.450)
pCO2 arterial: 49 mmHg — ABNORMAL HIGH (ref 32.0–48.0)
pO2, Arterial: 123 mmHg — ABNORMAL HIGH (ref 83.0–108.0)

## 2017-11-17 LAB — VITAMIN B12: Vitamin B-12: 826 pg/mL (ref 180–914)

## 2017-11-17 LAB — VALPROIC ACID LEVEL: Valproic Acid Lvl: 33 ug/mL — ABNORMAL LOW (ref 50.0–100.0)

## 2017-11-17 LAB — TSH: TSH: 7.537 u[IU]/mL — AB (ref 0.350–4.500)

## 2017-11-17 MED ORDER — PHENOL 1.4 % MT LIQD
2.0000 | OROMUCOSAL | Status: DC | PRN
  Filled 2017-11-17 (×2): qty 177

## 2017-11-17 MED ORDER — DEXTROSE 50 % IV SOLN
INTRAVENOUS | Status: AC
  Administered 2017-11-17: 50 mL
  Filled 2017-11-17: qty 50

## 2017-11-17 MED ORDER — LORAZEPAM 2 MG/ML IJ SOLN
1.0000 mg | Freq: Once | INTRAMUSCULAR | Status: AC
  Administered 2017-11-17: 1 mg via INTRAVENOUS

## 2017-11-17 NOTE — Consult Note (Addendum)
Reason for Consult: Altered mental status, gait difficulty Referring Physician: Amelia Jo, MD  CC: Altered mental status  HPI: Austin Gates is an 57 y.o. male with pertinent history of diabetes mellitus, hypertension, schizoaffective disorder treated with clozapine, lithium, and Depakote, mental retardation presenting to the ED with progressive gait difficulty and decreased alertness.  He was previously seen in the ED on 11/06/2017 with similar episode found to have lithium toxicity with elevated creatinine levels.  His lithium was discontinued and patient was discharged back to the group home.  Patient's caregiver from the group home who is currently at the bedside states since discharge patient has had worsening gait difficulty, he is less responsive confused and less talkative which is very unusual for him.  Caregiver also state he is having episodes of blank staring, excessive drooling, abnormal mouth and face twitching, tremors and gait freezing.  Per caregiver he is unable to walk unsupported sometimes will crawl on the floor.  Work-up in the ED showed abnormal CBC with decreased platelets of 76, and hyponatremia.  Lithium levels trending down. CT head did not show acute intracranial abnormality.  Chest x-ray normal.  Due to worsening gait difficulty and altered mental status patient was admitted for further evaluation.   Patient reports that he does not want to get better.    Past Medical History:  Diagnosis Date  . Diabetes mellitus without complication ([place])   . Hypertension     Past Surgical History:  Procedure Laterality Date  . KNEE ARTHROSCOPY      Family History  Problem Relation Age of Onset  . Diabetes Mother     Social History:  reports that he has been smoking cigarettes. He has been smoking about 1.00 pack per day. He has never used smokeless tobacco. He reports that he does not drink alcohol or use drugs.  No Known Allergies  Medications:  I have reviewed the  patient's current medications. Prior to Admission:  Medications Prior to Admission  Medication Sig Dispense Refill Last Dose  . Cholecalciferol (VITAMIN D-3) 5000 units TABS Take 1 tablet by mouth daily.     . cloZAPine (CLOZARIL) 100 MG tablet Take 1 tablet (100 mg total) by mouth 2 (two) times daily. 60 tablet 0   . cloZAPine (CLOZARIL) 100 MG tablet Take 3 tablets (300 mg total) by mouth at bedtime. 90 tablet 0   . desmopressin (DDAVP) 0.2 MG tablet Take 0.2 mg by mouth at bedtime.     . divalproex (DEPAKOTE ER) 500 MG 24 hr tablet Take 3 tablets (1,500 mg total) by mouth at bedtime. 90 tablet 0   . docusate sodium (COLACE) 100 MG capsule Take 1 capsule (100 mg total) by mouth 2 (two) times daily. 60 capsule 0   . hydrALAZINE (APRESOLINE) 25 MG tablet Take 25 mg by mouth at bedtime.     . metoprolol tartrate (LOPRESSOR) 25 MG tablet Take 0.5 tablets (12.5 mg total) by mouth 2 (two) times daily. 60 tablet 0   . ibuprofen (ADVIL,MOTRIN) 600 MG tablet Take 1 tablet (600 mg total) by mouth every 8 (eight) hours as needed. 30 tablet 0  at Unknown time  . polyethylene glycol (MIRALAX / GLYCOLAX) packet Take 17 g by mouth every other day. (Patient not taking: Reported on 11/16/2017) 15 each 0 Not Taking at Unknown time   Scheduled: . cholecalciferol  5,000 Units Oral Daily  . divalproex  1,000 mg Oral QHS  . docusate sodium  100 mg Oral BID  .  midodrine  5 mg Oral TID WC  . polyethylene glycol  17 g Oral QODAY    ROS: History obtained from  caregiver   General ROS: negative for - chills, fatigue, fever, night sweats, weight gain or weight loss Psychological ROS: Positive for - behavioral disorder, hallucinations, memory difficulties, mood swings or suicidal ideation Ophthalmic ROS: negative for - blurry vision, double vision, eye pain or loss of vision ENT ROS: negative for - epistaxis, nasal discharge, oral lesions, sore throat, tinnitus or vertigo Allergy and Immunology ROS: negative for -  hives or itchy/watery eyes Hematological and Lymphatic ROS: negative for - bleeding problems, bruising or swollen lymph nodes Endocrine ROS: negative for - galactorrhea, hair pattern changes, polydipsia/polyuria or temperature intolerance Respiratory ROS: negative for - cough, hemoptysis, shortness of breath or wheezing Cardiovascular ROS: negative for - chest pain, dyspnea on exertion, edema or irregular heartbeat Gastrointestinal ROS: negative for - abdominal pain, diarrhea, hematemesis, nausea/vomiting or stool incontinence Genito-Urinary ROS: negative for - dysuria, hematuria, incontinence or urinary frequency/urgency Musculoskeletal ROS: negative for - joint swelling. Positive for muscular weakness Neurological ROS: as noted in HPI Dermatological ROS: Positive for generalized bruising  Physical Exam   Vitals Blood pressure 116/85, pulse 63, temperature (!) 97.3 F (36.3 C), temperature source Oral, resp. rate 14, height _0  (1.854 m), weight 75 kg, SpO2 100 %.    HEENT-  Normocephalic, no lesions, without obvious abnormality.  Normal external eye and conjunctiva.  Normal TM's bilaterally.  Normal auditory canals and external ears. Normal external nose, mucus membranes and septum.  Drooling, Normal pharynx. Cardiovascular- S1, S2 normal, pulses palpable throughout   Lungs- chest clear, no wheezing, rales, normal symmetric air entry Abdomen- soft, non-tender; bowel sounds normal; no masses,  no organomegaly Extremities- no edema Lymph-no adenopathy palpable Musculoskeletal-no joint tenderness, deformity or swelling Skin-warm and dry, no hyperpigmentation, vitiligo, or suspicious lesions  Neurological Exam   Mental Status: Lethargic but easily awakened.  Follows some simple commands but refuses to follow others.  Speech minimal but fluent.   Cranial Nerves: II: Discs flat bilaterally; Blinks to bilateral confrontation, pupils equal, round, reactive to light and  accommodation III,IV, VI: ptosis not present, extra-ocular motions intact bilaterally V,VII: smile symmetric, facial light touch sensation intact VIII: hearing normal bilaterally IX,X: gag reflex deferred  XI: bilateral shoulder shrug XII: midline tongue extension Motor: Patient not cooperative for formal strength testing but able to maintain both upper extremities against gravity.  Able to resist movement strongly with the lower extremities Sensory: Responds to noxious stimuli in all extremities Deep Tendon Reflexes: 2+ and symmetric throughout Plantars: Right: downgoing                              Left: downgoing Cerebellar: Finger-to-nose testing intact bilaterally.  Unable to assess heel to shin testing due to cooperation Gait: not tested due to safety concerns  Data Review  Laboratory Studies:   Basic Metabolic Panel: Recent Labs  Lab 11/15/17 1622 11/16/17 0414 11/17/17 0436 11/17/17 1059  NA 148* 146* 147* 146*  K 4.7 4.4 4.4 4.1  CL 110 113* 115* 114*  CO2 _1 GLUCOSE 102* 92 67* 154*  BUN 44* 36* 29* 27*  CREATININE 1.57* 1.38* 1.09 1.09  CALCIUM 9.8 8.6* 8.9 8.9    Liver Function Tests: Recent Labs  Lab 11/17/17 1059  AST 39  ALT 24  ALKPHOS 62  BILITOT 0.5  PROT  5.5*  ALBUMIN 2.6*   No results for input(s): LIPASE, AMYLASE in the last 168 hours. Recent Labs  Lab 11/17/17 1059  AMMONIA 24    CBC: Recent Labs  Lab 11/15/17 1622 11/16/17 0414 11/16/17 1031 11/17/17 0436  WBC 7.9 5.8 DUPLICATE REQUEST 3.1*  NEUTROABS  --  5.2 PENDING  --   HGB 01.0* 93.2* DUPLICATE REQUEST 35.5*  HCT 73.2* 20.2* DUPLICATE REQUEST 54.2*  MCV 70.6 23.7 DUPLICATE REQUEST 62.8  PLT 96* 76* DUPLICATE REQUEST 70*    Cardiac Enzymes: Recent Labs  Lab 11/15/17 1622  TROPONINI <0.03    BNP: Invalid input(s): POCBNP  CBG: Recent Labs  Lab 11/16/17 0735 11/17/17 0735 11/17/17 1025 11/17/17 1100  GLUCAP 83 121* 32* 160*     Microbiology: Results for orders placed or performed during the hospital encounter of 08/21/16  Urine Culture     Status: None   Collection Time: 08/20/16 11:30 PM  Result Value Ref Range Status   Specimen Description URINE, CLEAN CATCH  Final   Special Requests Normal  Final   Culture   Final    NO GROWTH Performed at [facility], [place] [address] [place], [place] [address]    Report Status 08/22/2016 FINAL  Final    Coagulation Studies: No results for input(s): LABPROT, INR in the last 72 hours.  Urinalysis:  Recent Labs  Lab 11/15/17 1909  COLORURINE YELLOW*  LABSPEC 1.024  PHURINE 5.0  GLUCOSEU NEGATIVE  HGBUR NEGATIVE  BILIRUBINUR NEGATIVE  KETONESUR 5*  PROTEINUR NEGATIVE  NITRITE NEGATIVE  LEUKOCYTESUR NEGATIVE    Lipid Panel:     Component Value Date/Time   CHOL 135 09/30/2016 0700   TRIG 62 09/30/2016 0700   HDL 60 09/30/2016 0700   CHOLHDL 2.3 09/30/2016 0700   VLDL 12 09/30/2016 0700   LDLCALC 63 09/30/2016 0700    HgbA1C:  Lab Results  Component Value Date   HGBA1C 5.4 09/30/2016    Urine Drug Screen:      Component Value Date/Time   LABOPIA NONE DETECTED 09/29/2016 1546   COCAINSCRNUR NONE DETECTED 09/29/2016 1546   LABBENZ NONE DETECTED 09/29/2016 1546   AMPHETMU NONE DETECTED 09/29/2016 1546   THCU NONE DETECTED 09/29/2016 1546   LABBARB NONE DETECTED 09/29/2016 1546    Alcohol Level: No results for input(s): ETH in the last 168 hours.  Other results: EKG: normal sinus rhythm, 1st degree AV block.  Imaging: Dg Chest 2 View  Result Date: 11/15/2017 CLINICAL DATA:  Weakness, leg pain. EXAM: CHEST - 2 VIEW COMPARISON:  None. FINDINGS: The heart size and mediastinal contours are within normal limits. Both lungs are clear. No pneumothorax or pleural effusion is noted. The visualized skeletal structures are unremarkable. IMPRESSION: No active cardiopulmonary disease. Electronically Signed   By: Marijo Conception, M.D.   On:  11/15/2017 16:55   Ct Head Wo Contrast  Result Date: 11/17/2017 CLINICAL DATA:  Altered mental status EXAM: CT HEAD WITHOUT CONTRAST TECHNIQUE: Contiguous axial images were obtained from the base of the skull through the vertex without intravenous contrast. COMPARISON:  November 06, 2017 FINDINGS: Brain: The ventricles are normal in size and configuration. There is no appreciable intracranial mass, hemorrhage, extra-axial fluid collection, or midline shift. Brain parenchyma appears unremarkable. No evident acute infarct. Vascular: No hyperdense vessel. No appreciable vascular calcification. Skull: Bony calvarium appears intact. Sinuses/Orbits: There is mucosal thickening in several ethmoid air cells. Other visualized paranasal sinuses are clear. Orbits appear symmetric bilaterally. Other: There  is mild opacification in several inferior mastoid air cells. Most of the mastoid air cells are clear. IMPRESSION: Mild inferior mastoid disease bilaterally. Mucosal thickening in several ethmoid air cells. Study otherwise unremarkable. Electronically Signed   By: Lowella Grip III M.D.   On: 11/17/2017 11:16   Dg Abd 2 Views  Result Date: 11/15/2017 CLINICAL DATA:  Distention EXAM: ABDOMEN - 2 VIEW COMPARISON:  None. FINDINGS: Large stool burden throughout the colon. There is normal bowel gas pattern. No free air. No organomegaly or suspicious calcification. No acute bony abnormality. Visualized lung bases clear. IMPRESSION: Large stool burden.  No acute findings. Electronically Signed   By: Rolm Baptise M.D.   On: 11/15/2017 18:40    Patient seen and examined.  Clinical course and management discussed.  Necessary edits performed.  I agree with the above.  Assessment and plan of care developed and discussed below.    Assessment: 57 y.o. male with a known history of tobacco abuse, diabetes mellitus, hypertension, psychiatric disorder and mental retardation presenting with progressive gait difficulty with  frequent falls and decreased level of alertness.  Patient is recently evaluated for similar symptoms found to have lithium toxicity.  Lithium discontinued.  However patient now with worsening symptoms despite medication adjustment.  Seems to have signs and symptoms of depression as well which may be interfering with evaluation.  Due to inability to get an accurate examination, differential is quite broad.  Head CT reviewed and shows no acute changes.  Depakote level 33.  Last TSH elevated at 5.885 (up from .035).  Further work up recommended.    Plan 1. MRI of the brain without contrast 2. CT of the lumbar spine 3. TSH, B1, B12, RPR, ESR 4. EEG  This patient was staffed with Dr. Magda Paganini, D[place] Mince who personally evaluated patient, reviewed documentation and agreed with assessment and plan of care as above.  Rufina Falco, DNP, [name]-BC Board certified Nurse Practitioner Neurology Department  11/17/2017, 11:49 AM    Alexis Goodell, MD Neurology [phone]  11/17/2017  1:20 PM

## 2017-11-17 NOTE — Progress Notes (Signed)
Per MD okay for RN to place telemetry order.

## 2017-11-17 NOTE — NC FL2 (Addendum)
[place] LEVEL OF CARE SCREENING TOOL     IDENTIFICATION  Patient Name: Barrie Wale Birthdate: [birth year] Sex: male Admission Date (Current Location): 11/15/2017  [place] and [place] Number:  Engineering geologist and Address:  [facility], [address], [place], [place] [address]      Provider Number: [id number]  Attending Physician Name and Address:  Dustin Flock, MD  Relative Name and Phone Number:       Current Level of Care: Hospital Recommended Level of Care: [place] Prior Approval Number:    Date Approved/Denied:   PASRR Number:    Discharge Plan: SNF    Current Diagnoses: Patient Active Problem List   Diagnosis Date Noted  . ARF (acute renal failure) ([place]) 11/15/2017  . Elevated lithium level 11/06/2017  . Fall 11/06/2017  . Schizoaffective disorder, bipolar type ([place]) 10/04/2016  . Tobacco use disorder 09/30/2016  . 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 09/30/2016    Orientation RESPIRATION BLADDER Height & Weight     Self, Place  Normal Continent Weight: 165 lb 5.5 oz (75 kg) Height:  6\' 1"  (185.4 cm)  BEHAVIORAL SYMPTOMS/MOOD NEUROLOGICAL BOWEL NUTRITION STATUS:  PEG  (none) (none) Continent    AMBULATORY STATUS COMMUNICATION OF NEEDS Skin   Extensive Assist Verbally Normal                       Personal Care Assistance Level of Assistance  Bathing, Feeding, Dressing Bathing Assistance: Limited assistance Feeding assistance: Limited assistance Dressing Assistance: Limited assistance     Functional Limitations Info             SPECIAL CARE FACTORS FREQUENCY  PT (By licensed PT)                    Contractures Contractures Info: Not present    Additional Factors Info  Code Status               Current Medications (11/17/2017):  This is the current hospital active medication list Current Facility-Administered Medications  Medication Dose Route  Frequency Provider Last Rate Last Dose  . 0.9 %  sodium chloride infusion   Intravenous Continuous Amelia Jo, MD 125 mL/hr at 11/17/17 1103    . acetaminophen (TYLENOL) tablet 650 mg  650 mg Oral Q6H PRN Amelia Jo, MD       Or  . acetaminophen (TYLENOL) suppository 650 mg  650 mg Rectal Q6H PRN Amelia Jo, MD      . bisacodyl (DULCOLAX) EC tablet 5 mg  5 mg Oral Daily PRN Amelia Jo, MD   5 mg at 11/17/17 0954  . cholecalciferol (VITAMIN D) tablet 5,000 Units  5,000 Units Oral Daily Amelia Jo, MD   5,000 Units at 11/17/17 0750  . divalproex (DEPAKOTE ER) 24 hr tablet 1,000 mg  1,000 mg Oral QHS Amelia Jo, MD   1,000 mg at 11/16/17 2103  . docusate sodium (COLACE) capsule 100 mg  100 mg Oral BID Amelia Jo, MD   100 mg at 11/17/17 0751  . midodrine (PROAMATINE) tablet 5 mg  5 mg Oral TID WC Dustin Flock, MD   5 mg at 11/17/17 0751  . ondansetron (ZOFRAN) tablet 4 mg  4 mg Oral Q6H PRN Amelia Jo, MD       Or  . ondansetron [facility]) injection 4 mg  4 mg Intravenous Q6H PRN Amelia Jo, MD      . polyethylene glycol (  MIRALAX / GLYCOLAX) packet 17 g  17 g Oral Darl Householder, MD   17 g at 11/17/17 8381     Discharge Medications: Please see discharge summary for a list of discharge medications.  Relevant Imaging Results:  Relevant Lab Results:   Additional Information ss: [id number]  Shela Leff, LCSW

## 2017-11-17 NOTE — Progress Notes (Signed)
7111/Leggio - diet will changed to puree diet since he has trouble (as he has no teeth) with dysphagia 3 diet. [place]

## 2017-11-17 NOTE — Consult Note (Signed)
[facility] Consult   Reason for Consult: Follow-up consult for this 57 year old man with intellectual disability schizoaffective disorder and altered mental status Referring Physician: Pyreddy Patient Identification: Austin Gates MRN:  [id number] Principal Diagnosis: Schizoaffective disorder, bipolar type [facility]) Diagnosis:   Patient Active Problem List   Diagnosis Date Noted  . ARF (acute renal failure) ([place]) [N17.9] 11/15/2017  . Elevated lithium level [R79.89] 11/06/2017  . Fall [W19.XXXA] 11/06/2017  . Schizoaffective disorder, bipolar type ([place]) [F25.0] 10/04/2016  . Tobacco use disorder [F17.200] 09/30/2016  . 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 male patient admitted with patient not able to give information.  HPI: See previous notes.  Patient seen.  His caregiver who is very familiar with him was in the room and able to give some information.  I spoke with neurology today as well.  This afternoon when I came to see the patient he was worse off than he was yesterday.  Today he was very sedated.  Not able to speak articulately at all.  Not able to keep his eyes open or follow any commands.  This is quite a bit worse than yesterday although part of it may be all of it is from having to get some Ativan so that he could go for a x-ray study today.  Spoke with his caregiver.  It remains unclear what may have led to this decline.  He has had spells of agitation in the past with behavior problems that have actually responded to increased antipsychotics before but this time it seems to be more a matter of his getting more physically sick and tired and run down.  Recently had an episode of elevated lithium level but that is no longer part of the possibility.  His medicine doses have already been cut down significantly and he does not appear to be psychotic or catatonic so  much as just very sick and sedated  Past Psychiatric History: Long-standing history of schizophrenia behavior problems intellectual disability.  Baseline is pretty limited.  Nevertheless at baseline he is able to speak in an articulate tone of voice answer some questions and follow basic commands  Risk to Self:   Risk to Others:   Prior Inpatient Therapy:   Prior Outpatient Therapy:    Past Medical History:  Past Medical History:  Diagnosis Date  . Diabetes mellitus without complication ([place])   . Hypertension     Past Surgical History:  Procedure Laterality Date  . KNEE ARTHROSCOPY     Family History:  Family History  Problem Relation Age of Onset  . Diabetes Mother    Family Psychiatric  History: Unknown Social History:  Social History   Substance and Sexual Activity  Alcohol Use No     Social History   Substance and Sexual Activity  Drug Use No    Social History   Socioeconomic History  . Marital status: Single    Spouse name: Not on file  . Number of children: Not on file  . Years of education: Not on file  . Highest education level: Not on file  Occupational History  . Not on file  Social Needs  . Financial resource strain: Not on file  . Food insecurity:    Worry: Not on file    Inability: Not on file  . Transportation needs:    Medical: Not on file  Non-medical: Not on file  Tobacco Use  . Smoking status: Current Every Day Smoker    Packs/day: 1.00    Types: Cigarettes  . Smokeless tobacco: Never Used  Substance and Sexual Activity  . Alcohol use: No  . Drug use: No  . Sexual activity: Never  Lifestyle  . Physical activity:    Days per week: Not on file    Minutes per session: Not on file  . Stress: Not on file  Relationships  . Social connections:    Talks on phone: Not on file    Gets together: Not on file    Attends religious service: Not on file    Active member of club or organization: Not on file    Attends meetings of clubs or  organizations: Not on file    Relationship status: Not on file  Other Topics Concern  . Not on file  Social History Narrative  . Not on file   Additional Social History:    Allergies:  No Known Allergies  Labs:  Results for orders placed or performed during the hospital encounter of 11/15/17 (from the past 48 hour(s))  Urinalysis, Complete w Microscopic     Status: Abnormal   Collection Time: 11/15/17  7:09 PM  Result Value Ref Range   Color, Urine YELLOW (A) YELLOW   APPearance CLEAR (A) CLEAR   Specific Gravity, Urine 1.024 1.005 - 1.030   pH 5.0 5.0 - 8.0   Glucose, UA NEGATIVE NEGATIVE mg/dL   Hgb urine dipstick NEGATIVE NEGATIVE   Bilirubin Urine NEGATIVE NEGATIVE   Ketones, ur 5 (A) NEGATIVE mg/dL   Protein, ur NEGATIVE NEGATIVE mg/dL   Nitrite NEGATIVE NEGATIVE   Leukocytes, UA NEGATIVE NEGATIVE   RBC / HPF 0-5 0 - 5 RBC/hpf   WBC, UA 0-5 0 - 5 WBC/hpf   Bacteria, UA NONE SEEN NONE SEEN   Squamous Epithelial / LPF NONE SEEN 0 - 5   Mucus PRESENT     Comment: Performed at [facility], [address] [place], [place] [address]  HIV antibody (Routine Testing)     Status: None   Collection Time: 11/16/17  4:14 AM  Result Value Ref Range   HIV Screen 4th Generation wRfx Non Reactive Non Reactive    Comment: (NOTE) Performed At: [facility] [place], [place] [id number] Rush Farmer MD [id number]   Basic metabolic panel     Status: Abnormal   Collection Time: 11/16/17  4:14 AM  Result Value Ref Range   Sodium 146 (H) 135 - 145 mmol/L   Potassium 4.4 3.5 - 5.1 mmol/L   Chloride 113 (H) 98 - 111 mmol/L   CO2 26 22 - 32 mmol/L   Glucose, Bld 92 70 - 99 mg/dL   BUN 36 (H) 6 - 20 mg/dL   Creatinine, Ser 1.38 (H) 0.61 - 1.24 mg/dL   Calcium 8.6 (L) 8.9 - 10.3 mg/dL   GFR calc non Af Amer 56 (L) >60 mL/min   GFR calc Af Amer >60 >60 mL/min    Comment: (NOTE) The eGFR has been calculated using the CKD EPI equation. This  calculation has not been validated in all clinical situations. eGFR's persistently <60 mL/min signify possible Chronic Kidney Disease.    Anion gap 7 5 - 15    Comment: Performed at [facility], [address] [place], [place] [address]  CBC     Status: Abnormal   Collection Time: 11/16/17  4:14  AM  Result Value Ref Range   WBC 5.8 3.8 - 10.6 K/uL   RBC 3.32 (L) 4.40 - 5.90 MIL/uL   Hemoglobin 10.8 (L) 13.0 - 18.0 g/dL   HCT 32.2 (L) 40.0 - 52.0 %   MCV 97.0 80.0 - 100.0 fL   MCH 32.5 26.0 - 34.0 pg   MCHC 33.5 32.0 - 36.0 g/dL   RDW 15.7 (H) 11.5 - 14.5 %   Platelets 76 (L) 150 - 440 K/uL    Comment: Performed at [facility], [address] [place], [place] [address]  Differential     Status: Abnormal   Collection Time: 11/16/17  4:14 AM  Result Value Ref Range   Neutrophils Relative % 90 %   Neutro Abs 5.2 1.4 - 6.5 K/uL   Lymphocytes Relative 5 %   Lymphs Abs 0.3 (L) 1.0 - 3.6 K/uL   Monocytes Relative 4 %   Monocytes Absolute 0.2 0.2 - 1.0 K/uL   Eosinophils Relative 1 %   Eosinophils Absolute 0.0 0 - 0.7 K/uL   Basophils Relative 0 %   Basophils Absolute 0.0 0 - 0.1 K/uL    Comment: Performed at [facility], [address] [place], [place] [address]  Cortisol     Status: None   Collection Time: 11/16/17  4:14 AM  Result Value Ref Range   Cortisol, Plasma 53.2 ug/dL    Comment: (NOTE) AM    6.7 - 22.6 ug/dL PM   <10.0       ug/dL Performed at [facility] [address], [place], [place] [address]   Glucose, capillary     Status: None   Collection Time: 11/16/17  7:35 AM  Result Value Ref Range   Glucose-Capillary 83 70 - 99 mg/dL  CBC with Differential/Platelet     Status: None (Preliminary result)   Collection Time: 11/16/17 10:31 AM  Result Value Ref Range   WBC DUPLICATE REQUEST K/uL   RBC DUPLICATE REQUEST MIL/uL   Hemoglobin DUPLICATE REQUEST g/dL   HCT DUPLICATE REQUEST %   MCV DUPLICATE REQUEST fL   MCH  DUPLICATE REQUEST pg   MCHC DUPLICATE REQUEST g/dL   RDW DUPLICATE REQUEST %   Platelets DUPLICATE REQUEST K/uL   Neutrophils Relative % PENDING %   Neutro Abs PENDING K/uL   Band Neutrophils PENDING %   Lymphocytes Relative PENDING %   Lymphs Abs PENDING K/uL   Monocytes Relative PENDING %   Monocytes Absolute PENDING K/uL   Eosinophils Relative PENDING %   Eosinophils Absolute PENDING K/uL   Basophils Relative PENDING %   Basophils Absolute PENDING K/uL   WBC Morphology PENDING    RBC Morphology PENDING    Smear Review PENDING    Other PENDING %   nRBC PENDING /100 WBC   Metamyelocytes Relative PENDING %   Myelocytes PENDING %   Promyelocytes Relative PENDING %   Blasts PENDING %  Basic metabolic panel     Status: Abnormal   Collection Time: 11/17/17  4:36 AM  Result Value Ref Range   Sodium 147 (H) 135 - 145 mmol/L   Potassium 4.4 3.5 - 5.1 mmol/L    Comment: HEMOLYSIS AT THIS LEVEL MAY AFFECT RESULT   Chloride 115 (H) 98 - 111 mmol/L   CO2 27 22 - 32 mmol/L   Glucose, Bld 67 (L) 70 - 99 mg/dL   BUN 29 (H) 6 - 20 mg/dL   Creatinine, Ser 1.09 0.61 - 1.24  mg/dL   Calcium 8.9 8.9 - 10.3 mg/dL   GFR calc non Af Amer >60 >60 mL/min   GFR calc Af Amer >60 >60 mL/min    Comment: (NOTE) The eGFR has been calculated using the CKD EPI equation. This calculation has not been validated in all clinical situations. eGFR's persistently <60 mL/min signify possible Chronic Kidney Disease.    Anion gap 5 5 - 15    Comment: Performed at [facility], [address] [place], [place] [address]  CBC     Status: Abnormal   Collection Time: 11/17/17  4:36 AM  Result Value Ref Range   WBC 3.1 (L) 3.8 - 10.6 K/uL   RBC 3.51 (L) 4.40 - 5.90 MIL/uL   Hemoglobin 11.6 (L) 13.0 - 18.0 g/dL   HCT 33.4 (L) 40.0 - 52.0 %   MCV 95.2 80.0 - 100.0 fL   MCH 33.0 26.0 - 34.0 pg   MCHC 34.7 32.0 - 36.0 g/dL   RDW 15.5 (H) 11.5 - 14.5 %   Platelets 70 (L) 150 - 440 K/uL    Comment:  Performed at [facility], [address] [place], [place] [address]  Valproic acid level     Status: Abnormal   Collection Time: 11/17/17  4:37 AM  Result Value Ref Range   Valproic Acid Lvl 33 (L) 50.0 - 100.0 ug/mL    Comment: Performed at [facility], [address] [place], [place] [address]  Glucose, capillary     Status: Abnormal   Collection Time: 11/17/17  7:35 AM  Result Value Ref Range   Glucose-Capillary 121 (H) 70 - 99 mg/dL  Blood gas, arterial     Status: Abnormal   Collection Time: 11/17/17 10:23 AM  Result Value Ref Range   FIO2 0.28    Delivery systems NASAL CANNULA    pH, Arterial 7.38 7.350 - 7.450   pCO2 arterial 49 (H) 32.0 - 48.0 mmHg   pO2, Arterial 123 (H) 83.0 - 108.0 mmHg   Bicarbonate 29.0 (H) 20.0 - 28.0 mmol/L   Acid-Base Excess 3.1 (H) 0.0 - 2.0 mmol/L   O2 Saturation 98.7 %   Patient temperature 37.0    Collection site LEFT BRACHIAL    Sample type ARTERIAL DRAW    Allens test (pass/fail) PASS PASS    Comment: Performed at [facility], [address] [place], [place] [address]  Glucose, capillary     Status: Abnormal   Collection Time: 11/17/17 10:25 AM  Result Value Ref Range   Glucose-Capillary 68 (L) 70 - 99 mg/dL  Ammonia     Status: None   Collection Time: 11/17/17 10:59 AM  Result Value Ref Range   Ammonia 24 9 - 35 umol/L    Comment: Performed at [facility], [address] [place], [place] [address]  Comprehensive metabolic panel     Status: Abnormal   Collection Time: 11/17/17 10:59 AM  Result Value Ref Range   Sodium 146 (H) 135 - 145 mmol/L   Potassium 4.1 3.5 - 5.1 mmol/L   Chloride 114 (H) 98 - 111 mmol/L   CO2 27 22 - 32 mmol/L   Glucose, Bld 154 (H) 70 - 99 mg/dL   BUN 27 (H) 6 - 20 mg/dL   Creatinine, Ser 1.09 0.61 - 1.24 mg/dL   Calcium 8.9 8.9 - 10.3 mg/dL   Total Protein 5.5 (L) 6.5 - 8.1 g/dL   Albumin 2.6 (L) 3.5 - 5.0 g/dL  AST 39 15 - 41 U/L   ALT 24 0 - 44 U/L    Alkaline Phosphatase 62 38 - 126 U/L   Total Bilirubin 0.5 0.3 - 1.2 mg/dL   GFR calc non Af Amer >60 >60 mL/min   GFR calc Af Amer >60 >60 mL/min    Comment: (NOTE) The eGFR has been calculated using the CKD EPI equation. This calculation has not been validated in all clinical situations. eGFR's persistently <60 mL/min signify possible Chronic Kidney Disease.    Anion gap 5 5 - 15    Comment: Performed at [facility], [address] [place], [place] [address]  Glucose, capillary     Status: Abnormal   Collection Time: 11/17/17 11:00 AM  Result Value Ref Range   Glucose-Capillary 160 (H) 70 - 99 mg/dL  TSH     Status: Abnormal   Collection Time: 11/17/17  2:04 PM  Result Value Ref Range   TSH 7.537 (H) 0.350 - 4.500 uIU/mL    Comment: Performed by a 3rd Generation assay with a functional sensitivity of <=0.01 uIU/mL. Performed at [facility], [address] [place], [place] [address]   Sedimentation rate     Status: None   Collection Time: 11/17/17  2:04 PM  Result Value Ref Range   Sed Rate 13 0 - 20 mm/hr    Comment: Performed at [facility], [address] [place], [place] [address]    Current Facility-Administered Medications  Medication Dose Route Frequency Provider Last Rate Last Dose  . 0.9 %  sodium chloride infusion   Intravenous Continuous Amelia Jo, MD 125 mL/hr at 11/17/17 1103    . acetaminophen (TYLENOL) tablet 650 mg  650 mg Oral Q6H PRN Amelia Jo, MD       Or  . acetaminophen (TYLENOL) suppository 650 mg  650 mg Rectal Q6H PRN Amelia Jo, MD      . bisacodyl (DULCOLAX) EC tablet 5 mg  5 mg Oral Daily PRN Amelia Jo, MD   5 mg at 11/17/17 0954  . cholecalciferol (VITAMIN D) tablet 5,000 Units  5,000 Units Oral Daily Amelia Jo, MD   5,000 Units at 11/17/17 0750  . divalproex (DEPAKOTE ER) 24 hr tablet 1,000 mg  1,000 mg Oral QHS Amelia Jo, MD   1,000 mg at 11/16/17 2103  . docusate sodium (COLACE) capsule  100 mg  100 mg Oral BID Amelia Jo, MD   100 mg at 11/17/17 0751  . LORazepam (ATIVAN) injection 1 mg  1 mg Intravenous Once Alexis Goodell, MD      . midodrine (PROAMATINE) tablet 5 mg  5 mg Oral TID WC Dustin Flock, MD   Stopped at 11/17/17 1556  . ondansetron (ZOFRAN) tablet 4 mg  4 mg Oral Q6H PRN Amelia Jo, MD       Or  . ondansetron [facility]) injection 4 mg  4 mg Intravenous Q6H PRN Amelia Jo, MD      . polyethylene glycol (MIRALAX / GLYCOLAX) packet 17 g  17 g Oral Darl Householder, MD   17 g at 11/17/17 0751    Musculoskeletal: Strength & Muscle Tone: decreased Gait & Station: unable to stand Patient leans: N/A  Psychiatric Specialty Exam: Physical Exam  Nursing note and vitals reviewed. Constitutional: He appears well-developed and well-nourished.  HENT:  Head: Normocephalic and atraumatic.  Eyes: Pupils are equal, round, and reactive to light. Conjunctivae are normal.  Neck: Normal range of motion.  Cardiovascular: Normal heart  sounds.  Respiratory: Effort normal.  GI: Soft.  Musculoskeletal: Normal range of motion.  Skin: Skin is warm and dry.  Psychiatric: His affect is blunt. He is noncommunicative.    Review of Systems  Unable to perform ROS: Patient nonverbal    Blood pressure (!) 119/91, pulse 69, temperature (!) 97.3 F (36.3 C), temperature source Oral, resp. rate 14, height '6\' 1"'  (1.854 m), weight 75 kg, SpO2 100 %.Body mass index is 21.81 kg/m.  General Appearance: Casual  Eye Contact:  None  Speech:  Garbled and Slurred  Volume:  Decreased  Mood:  Negative  Affect:  Negative  Thought Process:  NA  Orientation:  Negative  Thought Content:  Negative  Suicidal Thoughts:  No  Homicidal Thoughts:  No  Memory:  Negative  Judgement:  Negative  Insight:  Negative  Psychomotor Activity:  Negative  Concentration:  Concentration: Negative  Recall:  Negative  Fund of Knowledge:  Negative  Language:  Negative  Akathisia:  Negative   Handed:  Right  AIMS (if indicated):     Assets:  Social Support  ADL's:  Impaired  Cognition:  Impaired,  Severe  Sleep:        Treatment Plan Summary: Medication management and Plan As noted above he really does not seem catatonic or psychotic right now so much as he seems sedated.  Part of this today is probably from medicine that was given to help him cooperate with getting a study.  The overall underlying dynamic remains unclear.  I agree with continuing to treat him with lower levels of the clozapine and Depakote than what he was getting previously and only to add back medications if he becomes agitated and needs something.  Continue to follow up regularly.  Disposition: No evidence of imminent risk to self or others at present.   Patient does not meet criteria for psychiatric inpatient admission.  Alethia Berthold, MD 11/17/2017 4:25 PM

## 2017-11-17 NOTE — Procedures (Signed)
ELECTROENCEPHALOGRAM REPORT   Patient: Austin Gates       Room #: 211A-AA EEG No. ID: [id number] Age: 57 y.o.        Sex: male Referring Physician: Posey Pronto Report Date:  11/17/2017        Interpreting Physician: Alexis Goodell  History: Austin Gates is an 57 y.o. male with altered mental status and starring spells  Medications:  Vitamin D, Depakote, Colace, Proamatine, Miralax  Conditions of Recording:  This is a 21 channel routine scalp EEG performed with bipolar and monopolar montages arranged in accordance to the international 10/20 system of electrode placement. One channel was dedicated to EKG recording.  The patient is in the awake and uncooperative state.  Description:  The waking background activity is marred by muscle and movement artifact that often obscures the background. When able to be evaluated the background activity is slow.  It consists of a mixture of low voltage poorly organized delta activity.  Some intermixed that activity is noted as well. This activity is persistent throughout the recording.  No epileptiform activity is noted.   Hyperventilation was not performed.  Intermittent photic stimulation was performed but failed to illicit any change in the tracing.   IMPRESSION: This is an abnormal EEG secondary to general background slowing.  This finding may be seen with a diffuse disturbance that is etiologically nonspecific, but may include a metabolic encephalopathy, among other possibilities.  No epileptiform activity was noted.     Alexis Goodell, MD Neurology [phone] 11/17/2017, 4:51 PM

## 2017-11-17 NOTE — Plan of Care (Addendum)
Patient had a low blood sugar blood glucose checked and D50 given for glucose of 68. CT of head, Ct of spine, EEG and MRI performed for possible seizures or stroke.  Problem: Education: Goal: Knowledge of General Education information will improve Description Including pain rating scale, medication(s)/side effects and non-pharmacologic comfort measures Outcome: Progressing   Problem: Health Behavior/Discharge Planning: Goal: Ability to manage health-related needs will improve Outcome: Progressing   Problem: Clinical Measurements: Goal: Ability to maintain clinical measurements within normal limits will improve Outcome: Progressing Goal: Will remain free from infection Outcome: Progressing Goal: Diagnostic test results will improve Outcome: Progressing Goal: Respiratory complications will improve Outcome: Progressing Goal: Cardiovascular complication will be avoided Outcome: Progressing   Problem: Activity: Goal: Risk for activity intolerance will decrease Outcome: Progressing   Problem: Nutrition: Goal: Adequate nutrition will be maintained Outcome: Progressing   Problem: Coping: Goal: Level of anxiety will decrease Outcome: Progressing   Problem: Elimination: Goal: Will not experience complications related to bowel motility Outcome: Progressing Goal: Will not experience complications related to urinary retention Outcome: Progressing   Problem: Pain Managment: Goal: General experience of comfort will improve Outcome: Progressing   Problem: Safety: Goal: Ability to remain free from injury will improve Outcome: Progressing   Problem: Skin Integrity: Goal: Risk for impaired skin integrity will decrease Outcome: Progressing

## 2017-11-17 NOTE — Progress Notes (Signed)
[facility]                                                                                                                                                                                  Patient Demographics   Austin Gates, is a 57 y.o. male, DOB - 09-28-1960, YSH:[id number]  Admit date - 11/15/2017   Admitting Physician Amelia Jo, MD  Outpatient Primary MD for the patient is Raelyn Number, MD   LOS - 2  Subjective: Patient has schizoaffective disorder, bipolar type who is on multiple psychiatric medications brought to the hospital with difficulty with ambulation as well as hypotension  This morning he was more awake now is barely responsive. Patient able to open his eyes barely with a sternal rub   Review of Systems:   CONSTITUTIONAL: Patient unable to provide any review of systems  Vitals:   Vitals:   11/17/17 0500 11/17/17 0702 11/17/17 1011 11/17/17 1025  BP:  100/78 115/88 116/85  Pulse:  65 64 63  Resp:  18 14 14   Temp:      TempSrc:      SpO2:  96% 100% 100%  Weight: 75 kg     Height:        Wt Readings from Last 3 Encounters:  11/17/17 75 kg  11/06/17 65.8 kg  10/13/17 72.6 kg     Intake/Output Summary (Last 24 hours) at 11/17/2017 1034 Last data filed at 11/17/2017 1155 Gross per 24 hour  Intake 3651.69 ml  Output -  Net 3651.69 ml    Physical Exam:   GENERAL: Critically ill  HEAD, EYES, EARS, NOSE AND THROAT: Atraumatic, normocephalic. Extraocular muscles are intact. Pupils equal and reactive to light. Sclerae anicteric. No conjunctival injection. No oro-pharyngeal erythema.  NECK: Supple. There is no jugular venous distention. No bruits, no lymphadenopathy, no thyromegaly.  HEART: Regular rate and rhythm,. No murmurs, no rubs, no clicks.  LUNGS: Clear to auscultation bilaterally. No rales or rhonchi. No wheezes.  ABDOMEN: Soft, flat, nontender, nondistended. Has good bowel sounds. No hepatosplenomegaly  appreciated.  EXTREMITIES: No evidence of any cyanosis, clubbing, or peripheral edema.  +2 pedal and radial pulses bilaterally.  NEUROLOGIC: Very lethargic SKIN: Moist and warm with no rashes appreciated.  Psych: Very lethargic LN: No inguinal LN enlargement    Antibiotics   Anti-infectives (From admission, onward)   None      Medications   Scheduled Meds: . cholecalciferol  5,000 Units Oral Daily  . divalproex  1,000 mg Oral QHS  . docusate sodium  100 mg Oral BID  . heparin  5,000 Units Subcutaneous Q8H  . midodrine  5  mg Oral TID WC  . polyethylene glycol  17 g Oral QODAY   Continuous Infusions: . sodium chloride 125 mL/hr at 11/17/17 1007   PRN Meds:.acetaminophen **OR** acetaminophen, bisacodyl, ondansetron **OR** ondansetron (ZOFRAN) IV   Data Review:   Micro Results No results found for this or any previous visit (from the past 240 hour(s)).  Radiology Reports Dg Chest 2 View  Result Date: 11/15/2017 CLINICAL DATA:  Weakness, leg pain. EXAM: CHEST - 2 VIEW COMPARISON:  None. FINDINGS: The heart size and mediastinal contours are within normal limits. Both lungs are clear. No pneumothorax or pleural effusion is noted. The visualized skeletal structures are unremarkable. IMPRESSION: No active cardiopulmonary disease. Electronically Signed   By: Marijo Conception, M.D.   On: 11/15/2017 16:55   Ct Head Wo Contrast  Result Date: 11/06/2017 CLINICAL DATA:  Ataxia, head trauma. EXAM: CT HEAD WITHOUT CONTRAST TECHNIQUE: Contiguous axial images were obtained from the base of the skull through the vertex without intravenous contrast. COMPARISON:  09/17/2016 FINDINGS: Brain: No acute intracranial abnormality. Specifically, no hemorrhage, hydrocephalus, mass lesion, acute infarction, or significant intracranial injury. Vascular: No hyperdense vessel or unexpected calcification. Skull: No acute calvarial abnormality. Sinuses/Orbits: Visualized paranasal sinuses and mastoids clear.  Orbital soft tissues unremarkable. Other: None IMPRESSION: No acute intracranial abnormality. Electronically Signed   By: Rolm Baptise M.D.   On: 11/06/2017 14:56   Dg Abd 2 Views  Result Date: 11/15/2017 CLINICAL DATA:  Distention EXAM: ABDOMEN - 2 VIEW COMPARISON:  None. FINDINGS: Large stool burden throughout the colon. There is normal bowel gas pattern. No free air. No organomegaly or suspicious calcification. No acute bony abnormality. Visualized lung bases clear. IMPRESSION: Large stool burden.  No acute findings. Electronically Signed   By: Rolm Baptise M.D.   On: 11/15/2017 18:40     CBC Recent Labs  Lab 11/15/17 1622 11/16/17 0414 11/16/17 1031 11/17/17 0436  WBC 7.9 5.8 DUPLICATE REQUEST 3.1*  HGB 76.1* 60.7* DUPLICATE REQUEST 37.1*  HCT 06.2* 69.4* DUPLICATE REQUEST 85.4*  PLT 96* 76* DUPLICATE REQUEST 70*  MCV 62.7 03.5 DUPLICATE REQUEST 00.9  MCH 38.1 82.9 DUPLICATE REQUEST 93.7  MCHC 16.9 67.8 DUPLICATE REQUEST 93.8  RDW 10.1* 75.1* DUPLICATE REQUEST 02.5*  LYMPHSABS  --  0.3* PENDING  --   MONOABS  --  0.2 PENDING  --   EOSABS  --  0.0 PENDING  --   BASOSABS  --  0.0 PENDING  --     Chemistries  Recent Labs  Lab 11/15/17 1622 11/16/17 0414 11/17/17 0436  NA 148* 146* 147*  K 4.7 4.4 4.4  CL 110 113* 115*  CO2 28 26 27   GLUCOSE 102* 92 67*  BUN 44* 36* 29*  CREATININE 1.57* 1.38* 1.09  CALCIUM 9.8 8.6* 8.9   ------------------------------------------------------------------------------------------------------------------ estimated creatinine clearance is 81.2 mL/min (by C-G formula based on SCr of 1.09 mg/dL). ------------------------------------------------------------------------------------------------------------------ No results for input(s): HGBA1C in the last 72 hours. ------------------------------------------------------------------------------------------------------------------ No results for input(s): CHOL, HDL, LDLCALC, TRIG, CHOLHDL,  LDLDIRECT in the last 72 hours. ------------------------------------------------------------------------------------------------------------------ No results for input(s): TSH, T4TOTAL, T3FREE, THYROIDAB in the last 72 hours.  Invalid input(s): FREET3 ------------------------------------------------------------------------------------------------------------------ No results for input(s): VITAMINB12, FOLATE, FERRITIN, TIBC, IRON, RETICCTPCT in the last 72 hours.  Coagulation profile No results for input(s): INR, PROTIME in the last 168 hours.  No results for input(s): DDIMER in the last 72 hours.  Cardiac Enzymes Recent Labs  Lab 11/15/17 1622  TROPONINI <0.03   ------------------------------------------------------------------------------------------------------------------ Invalid input(s):  POCBNP    Assessment & Plan   1.   Acute encephalopathy now worse with poorly responsive now I will order stat ABG Stat blood glucose Checked ammonia level Stat CT scan of the head Neurology consult has been ordered I will discontinue his Klonopin I will order EEG patient may be having seizures  2. Acute renal failure, likely prerenal, r resolved with IV hydration   3.  Hypotension, likely related to dehydration and possible side effects from medications.    Continue midodrine  4.  Constipation, likely related to dehydration and possible side effects from medications.    Once patient more awake we will address constipation.  5.  Schizoaffective disorder, cruciate psych input due to decreased responsiveness will discontinue any sedating medication  6.  Bipolar disorder,  as #5  7.  Tobacco abuse.  Smoking cessation was provided      Code Status Orders  (From admission, onward)         Start     Ordered   11/16/17 0019  Full code  Continuous     11/16/17 0018        Code Status History    Date Active Date Inactive Code Status Order ID Comments User Context    09/29/2016 2043 10/07/2016 1514 Full Code [id number]  Gonzella Lex, MD Inpatient   09/17/2016 1901 09/18/2016 1556 Full Code [id number]  Demetrios Loll, MD Inpatient           Consults psychiatry  DVT Prophylaxis  Lovenox  Lab Results  Component Value Date   PLT 70 (L) 11/17/2017     Time Spent in minutes 45 minutes spent critical care time spent  Sister updated  Dustin Flock M.D on 11/17/2017 at 10:34 AM  Between 7am to 6pm - Pager - [phone]  After 6pm go to [email or web address] - Proofreader  [employer]   Office  [phone]

## 2017-11-17 NOTE — Progress Notes (Signed)
eeg completed ° °

## 2017-11-17 NOTE — Clinical Social Work Note (Addendum)
Clinical Social Work Assessment  Patient Details  Name: Austin Gates MRN: [id number] Date of Birth: [birth year]  Date of referral:  11/17/17               Reason for consult:  Facility Placement                Permission sought to share information with:    Permission granted to share information::     Name::        Agency::     Relationship::     Contact Information:     Housing/Transportation Living arrangements for the past 2 months:  [place] of Information:  Facility, Siblings Patient Interpreter Needed:  None Criminal Activity/Legal Involvement Pertinent to Current Situation/Hospitalization:  No - Comment as needed Significant Relationships:  Siblings Lives with:  Facility Resident Do you feel safe going back to the place where you live?    Need for family participation in patient care:  Yes (Comment)  Care giving concerns:  Patient resides in an ALF facility, NOT and ALF. The owner of the home is: [place] Blas: [phone] and the legal guardian is his sister: Austin Gates: [phone].   Social Worker assessment / plan:  CSW had a long conversation with patient's sister yesterday and she is aware that PT has recommended short term rehab. Patient does not adjust well to change and CSW is concerned that if patient is placed, he will have an adjustment concern. CSW spoke with Ms. Austin Gates this morning and they wish for patient to go to short term rehab. Patient will require a level 2 pasrr and a facility that will accept him. Patient is medicaid only and will probably be difficult to place.  Employment status:  Disabled (Comment on whether or not currently receiving Disability) Insurance information:    PT Recommendations:    Information / Referral to community resources:     Patient/Family's Response to care:  Sister is very involved in patient's care.   Patient/Family's Understanding of and Emotional Response to Diagnosis, Current  Treatment, and Prognosis:  She is concerned as well over switching patient's place of residence.   Emotional Assessment Appearance:  (appropriate) Attitude/Demeanor/Rapport:  (calm) Affect (typically observed):    Orientation:  Oriented to Self, Oriented to Place Alcohol / Substance use:  Not Applicable Psych involvement (Current and /or in the community):  Yes (Comment)  Discharge Needs  Concerns to be addressed:  Discharge Planning Concerns Readmission within the last 30 days:  No Current discharge risk:  None Barriers to Discharge:  [place] ([place])   Shela Leff, LCSW 11/17/2017, 11:37 AM

## 2017-11-17 NOTE — Clinical Social Work Note (Addendum)
Currently no bed offers and patient's pasrr is pending. Patient currently has a Corporate investment banker and will need to be without sitter/telesitter 24 hours prior to discharge. CSW spoke with patient's sister this afternoon and she is aware that patient's group home stated that they would like for patient to go to rehab and she is in agreement.  Shela Leff MSW,LCSW [phone]

## 2017-11-18 ENCOUNTER — Inpatient Hospital Stay: Payer: Medicaid Other

## 2017-11-18 ENCOUNTER — Inpatient Hospital Stay: Payer: Self-pay

## 2017-11-18 LAB — CBC WITH DIFFERENTIAL/PLATELET
BASOS ABS: 0 10*3/uL (ref 0–0.1)
BASOS PCT: 1 %
Eosinophils Absolute: 0 10*3/uL (ref 0–0.7)
Eosinophils Relative: 1 %
HEMATOCRIT: 31.8 % — AB (ref 40.0–52.0)
Hemoglobin: 11 g/dL — ABNORMAL LOW (ref 13.0–18.0)
LYMPHS PCT: 10 %
Lymphs Abs: 0.3 10*3/uL — ABNORMAL LOW (ref 1.0–3.6)
MCH: 33.1 pg (ref 26.0–34.0)
MCHC: 34.5 g/dL (ref 32.0–36.0)
MCV: 95.8 fL (ref 80.0–100.0)
MONO ABS: 0.1 10*3/uL — AB (ref 0.2–1.0)
Monocytes Relative: 5 %
NEUTROS ABS: 2.2 10*3/uL (ref 1.4–6.5)
NEUTROS PCT: 83 %
PLATELETS: 63 10*3/uL — AB (ref 150–440)
RBC: 3.32 MIL/uL — AB (ref 4.40–5.90)
RDW: 15.7 % — AB (ref 11.5–14.5)
WBC: 2.6 10*3/uL — ABNORMAL LOW (ref 3.8–10.6)

## 2017-11-18 LAB — COMPREHENSIVE METABOLIC PANEL
ALK PHOS: 61 U/L (ref 38–126)
ALT: 24 U/L (ref 0–44)
AST: 33 U/L (ref 15–41)
Albumin: 2.5 g/dL — ABNORMAL LOW (ref 3.5–5.0)
Anion gap: 3 — ABNORMAL LOW (ref 5–15)
BUN: 24 mg/dL — ABNORMAL HIGH (ref 6–20)
CALCIUM: 9.3 mg/dL (ref 8.9–10.3)
CHLORIDE: 115 mmol/L — AB (ref 98–111)
CO2: 28 mmol/L (ref 22–32)
CREATININE: 1.08 mg/dL (ref 0.61–1.24)
GFR calc Af Amer: >60 mL/min (ref >60)
GFR calc non Af Amer: >60 mL/min (ref >60)
Glucose, Bld: 84 mg/dL (ref 70–99)
Potassium: 4.6 mmol/L (ref 3.5–5.1)
SODIUM: 146 mmol/L — AB (ref 135–145)
Total Bilirubin: 0.6 mg/dL (ref 0.3–1.2)
Total Protein: 5.2 g/dL — ABNORMAL LOW (ref 6.5–8.1)

## 2017-11-18 LAB — RPR: RPR Ser Ql: NONREACTIVE

## 2017-11-18 LAB — GLUCOSE, CAPILLARY
GLUCOSE-CAPILLARY: 60 mg/dL — AB (ref 70–99)
GLUCOSE-CAPILLARY: 81 mg/dL (ref 70–99)

## 2017-11-18 LAB — PROTIME-INR
INR: 0.95
PROTHROMBIN TIME: 12.6 s (ref 11.4–15.2)

## 2017-11-18 LAB — T4, FREE: Free T4: 0.84 ng/dL (ref 0.82–1.77)

## 2017-11-18 LAB — PREALBUMIN: Prealbumin: 13.7 mg/dL — ABNORMAL LOW (ref 18–38)

## 2017-11-18 LAB — LACTIC ACID, PLASMA: LACTIC ACID, VENOUS: 1.2 mmol/L (ref 0.5–1.9)

## 2017-11-18 LAB — PHOSPHORUS: PHOSPHORUS: 2.8 mg/dL (ref 2.5–4.6)

## 2017-11-18 LAB — MAGNESIUM: Magnesium: 1.9 mg/dL (ref 1.7–2.4)

## 2017-11-18 MED ORDER — SODIUM CHLORIDE 0.45 % IV SOLN
INTRAVENOUS | Status: AC
  Administered 2017-11-18: 08:00:00 via INTRAVENOUS

## 2017-11-18 MED ORDER — SODIUM CHLORIDE 0.9% FLUSH
10.0000 mL | Freq: Two times a day (BID) | INTRAVENOUS | Status: DC
  Administered 2017-11-18: 10 mL
  Administered 2017-11-19: 20 mL
  Administered 2017-11-19 – 2017-11-20 (×2): 10 mL
  Administered 2017-11-20: 20 mL
  Administered 2017-11-21 – 2017-11-24 (×4): 10 mL

## 2017-11-18 MED ORDER — SODIUM CHLORIDE 0.9% FLUSH: 10.0000 mL | INTRAVENOUS | Status: DC | PRN

## 2017-11-18 MED ORDER — INSULIN ASPART 100 UNIT/ML ~~LOC~~ SOLN
0.0000 [IU] | Freq: Four times a day (QID) | SUBCUTANEOUS | Status: DC
  Administered 2017-11-20 – 2017-11-24 (×4): 1 [IU] via SUBCUTANEOUS
  Filled 2017-11-18 (×4): qty 1

## 2017-11-18 MED ORDER — NICOTINE 14 MG/24HR TD PT24
14.0000 mg | MEDICATED_PATCH | Freq: Every day | TRANSDERMAL | Status: DC
  Administered 2017-11-21 – 2017-11-23 (×3): 14 mg via TRANSDERMAL
  Filled 2017-11-18 (×4): qty 1

## 2017-11-18 MED ORDER — DEXTROSE 50 % IV SOLN
INTRAVENOUS | Status: AC
  Administered 2017-11-19: 25 mL
  Filled 2017-11-18: qty 50

## 2017-11-18 MED ORDER — TRACE MINERALS CR-CU-MN-SE-ZN 10-1000-500-60 MCG/ML IV SOLN
INTRAVENOUS | Status: AC
  Administered 2017-11-19: 05:00:00 via INTRAVENOUS
  Filled 2017-11-18: qty 960

## 2017-11-18 NOTE — Progress Notes (Signed)
Spoke with Rn re PICC to be placed for TPN.  RN states she will notify CVW.

## 2017-11-18 NOTE — Progress Notes (Signed)
Subjective: Patient is awake and alert but not talking.  Is refusing to eat.    Objective: Current vital signs: BP 115/78 (BP Location: Right Arm)   Pulse 93   Temp (!) 94.2 F (34.6 C) (Rectal)   Resp 20   Ht '6\' 1"'  (1.854 m)   Wt 75 kg   SpO2 94%   BMI 21.81 kg/m  Vital signs in last 24 hours: Temp:  [92.4 F (33.6 C)-94.2 F (34.6 C)] 94.2 F (34.6 C) (09/28 0828) Pulse Rate:  [69-93] 93 (09/28 1249) Resp:  [20-21] 20 (09/28 1249) BP: (115-122)/(78-99) 115/78 (09/28 1249) SpO2:  [94 %-100 %] 94 % (09/28 1249)  Intake/Output from previous day: 09/27 0701 - 09/28 0700 In: 2179.4 [P.O.:210; I.V.:1969.4] Out: 500 [Urine:500] Intake/Output this shift: Total I/O In: 1221.3 [I.V.:1221.3] Out: -  Nutritional status:  Diet Order            DIET - DYS 1 Room service appropriate? Yes; Fluid consistency: Thin  Diet effective now              Neurologic Exam: Mental Status: Alert and awake.  Begins to talk then someone walks in the room and he says nothing.  Follows simple commands.  . Cranial Nerves: II: Discs flat bilaterally; Blinks to bilateral confrontation, pupils equal, round, reactive to light and accommodation III,IV, VI: ptosis not present, extra-ocular motions intact bilaterally V,VII: smile symmetric, facial light touch sensation normal bilaterally VIII: hearing normal bilaterally IX,X: gag reflex present XI: bilateral shoulder shrug XII: midline tongue extension Motor: Moves upper extremities spontaneously.   Sensory: Responds to noxious stimuli in all extremities   Lab Results: Basic Metabolic Panel: Recent Labs  Lab 11/15/17 1622 11/16/17 0414 11/17/17 0436 11/17/17 1059 11/18/17 0709  NA 148* 146* 147* 146*  --   K 4.7 4.4 4.4 4.1  --   CL 110 113* 115* 114*  --   CO2 '28 26 27 27  ' --   GLUCOSE 102* 92 67* 154*  --   BUN 44* 36* 29* 27*  --   CREATININE 1.57* 1.38* 1.09 1.09  --   CALCIUM 9.8 8.6* 8.9 8.9  --   MG  --   --   --   --  1.9   PHOS  --   --   --   --  2.8    Liver Function Tests: Recent Labs  Lab 11/17/17 1059  AST 39  ALT 24  ALKPHOS 62  BILITOT 0.5  PROT 5.5*  ALBUMIN 2.6*   No results for input(s): LIPASE, AMYLASE in the last 168 hours. Recent Labs  Lab 11/17/17 1059  AMMONIA 24    CBC: Recent Labs  Lab 11/15/17 1622 11/16/17 0414 11/16/17 1031 11/17/17 0436 11/18/17 0709  WBC 7.9 5.8 DUPLICATE REQUEST 3.1* 2.6*  NEUTROABS  --  5.2 PENDING  --  2.2  HGB 82.7* 07.8* DUPLICATE REQUEST 67.5* 11.0*  HCT 44.9* 20.1* DUPLICATE REQUEST 00.7* 31.8*  MCV 12.1 97.5 DUPLICATE REQUEST 88.3 25.4  PLT 96* 76* DUPLICATE REQUEST 70* 63*    Cardiac Enzymes: Recent Labs  Lab 11/15/17 1622  TROPONINI <0.03    Lipid Panel: No results for input(s): CHOL, TRIG, HDL, CHOLHDL, VLDL, LDLCALC in the last 168 hours.  CBG: Recent Labs  Lab 11/16/17 0735 11/17/17 0735 11/17/17 1025 11/17/17 1100 11/18/17 0837  GLUCAP 83 121* 68* 160* 81    Microbiology: Results for orders placed or performed during the hospital encounter of 08/21/16  Urine  Culture     Status: None   Collection Time: 08/20/16 11:30 PM  Result Value Ref Range Status   Specimen Description URINE, CLEAN CATCH  Final   Special Requests Normal  Final   Culture   Final    NO GROWTH Performed at [facility], [address], [place], [place] [address]    Report Status 08/22/2016 FINAL  Final    Coagulation Studies: Recent Labs    11/18/17 1228  LABPROT 12.6  INR 0.95    Imaging: Ct Head Wo Contrast  Result Date: 11/17/2017 CLINICAL DATA:  Altered mental status EXAM: CT HEAD WITHOUT CONTRAST TECHNIQUE: Contiguous axial images were obtained from the base of the skull through the vertex without intravenous contrast. COMPARISON:  November 06, 2017 FINDINGS: Brain: The ventricles are normal in size and configuration. There is no appreciable intracranial mass, hemorrhage, extra-axial fluid collection, or midline shift.  Brain parenchyma appears unremarkable. No evident acute infarct. Vascular: No hyperdense vessel. No appreciable vascular calcification. Skull: Bony calvarium appears intact. Sinuses/Orbits: There is mucosal thickening in several ethmoid air cells. Other visualized paranasal sinuses are clear. Orbits appear symmetric bilaterally. Other: There is mild opacification in several inferior mastoid air cells. Most of the mastoid air cells are clear. IMPRESSION: Mild inferior mastoid disease bilaterally. Mucosal thickening in several ethmoid air cells. Study otherwise unremarkable. Electronically Signed   By: Lowella Grip III M.D.   On: 11/17/2017 11:16   Ct Lumbar Spine Wo Contrast  Result Date: 11/17/2017 CLINICAL DATA:  Lower extremity weakness and multiple falls EXAM: CT LUMBAR SPINE WITHOUT CONTRAST TECHNIQUE: Multidetector CT imaging of the lumbar spine was performed without intravenous contrast administration. Multiplanar CT image reconstructions were also generated. COMPARISON:  None. FINDINGS: Segmentation: 5 lumbar type vertebrae. Alignment: Normal. Vertebrae: There is an intermediate sized Schmorl's node at the superior L5 endplate. There is endplate sclerosis at D5-3 and L5-S1, worst at the inferior L4 endplate. No acute fracture. No lytic or blastic lesion. Paraspinal and other soft tissues: Negative. Disc levels: T11-T12: No disc herniation or stenosis.  Normal facets. T12-L1: No disc herniation or stenosis.  Normal facets. L1-L2: No disc herniation or stenosis.  Normal facets. L2-L3: Mild disc degeneration and endplate spurring. Normal facets. No spinal canal or neural foraminal stenosis. L3-L4: Minimal endplate spurring. No disc herniation or stenosis. Mild facet hypertrophy. L4-L5: Mild facet hypertrophy. Endplate remodeling with small disc bulge and minimal spurring. No spinal canal stenosis. Mild bilateral foraminal narrowing. L5-S1: Normal disc. Moderate facet hypertrophy. No spinal canal  stenosis. Mild bilateral foraminal narrowing. IMPRESSION: 1. No acute fracture or listhesis. 2. Mild bilateral foraminal stenosis at L4-5 and L5-S1, predominantly due to facet arthrosis. 3. L4-L5 and L5-S1 degenerative disc disease with endplate remodeling. 4. No spinal canal stenosis. Electronically Signed   By: Ulyses Jarred M.D.   On: 11/17/2017 17:50   Dg Chest Port 1 View  Result Date: 11/18/2017 CLINICAL DATA:  Hypothermia and altered mental status. Poor historian. Current smoker. EXAM: PORTABLE CHEST 1 VIEW COMPARISON:  11/15/2017 FINDINGS: Grossly unchanged cardiac silhouette and mediastinal contours given reduced lung volumes. Interval development potential small bilateral effusions with associated worsening bibasilar opacities, left greater than right. Mild pulmonary is congestion without frank evidence of edema. No pneumothorax. Ossicles are again noted superior to the left coracoid process. No acute osseus abnormalities. IMPRESSION: Suspected small bilateral effusions with worsening bibasilar opacities, left greater than right, atelectasis versus infiltrate. Further evaluation with a PA and lateral chest radiograph may be  obtained as clinically indicated. Electronically Signed   By: Sandi Mariscal M.D.   On: 11/18/2017 08:58   [place] Ekg Site Rite  Result Date: 11/18/2017 If Site Rite image not attached, placement could not be confirmed due to current cardiac rhythm.   Medications:  I have reviewed the patient's current medications. Scheduled: . cholecalciferol  5,000 Units Oral Daily  . divalproex  1,000 mg Oral QHS  . docusate sodium  100 mg Oral BID  . midodrine  5 mg Oral TID WC  . nicotine  14 mg Transdermal Daily  . polyethylene glycol  17 g Oral QODAY    Assessment/Plan: Patient alert but not talking or eating.  CT of the lumbar spine reviewed and shows no evidence of cord compromise.  TSH continues to increase but free T4 is normal.  B12, ESR and RPR are normal.  B1 is pending.   EEG only significant for slowing.  MRI pending.  There is some concern that discontinuation of his psych medications may be causing him to decompensate.  Depakote level subtherapeutic.  Patient also with abnormal CXR which may be complicating clinical picture as well and causing some degree of metabolic encephalopathy.    Recommendations: 1. MRI pending 2. Would continue Depakote    LOS: 3 days   Alexis Goodell, MD Neurology [phone] 11/18/2017  1:20 PM

## 2017-11-18 NOTE — Progress Notes (Signed)
PHARMACY - ADULT TOTAL PARENTERAL NUTRITION CONSULT NOTE   Pharmacy Consult for TPN Indication: malnutrition  Patient Measurements: Height: 6\' 1"  (185.4 cm) Weight: 165 lb 5.5 oz (75 kg) IBW/kg (Calculated) : 79.9 TPN AdjBW (KG): 65.8 Body mass index is 21.81 kg/m. Usual Weight:   Assessment: 57 yom cc leg pain. Patient's family is concerned for swallowing problems and malnutrition. Dietitian has put in preliminary note recommended Clinimix 5/15 for tonight with a full assessment and note to follow tomorrow. We are still waiting for PICC placement.   GI: Dysphagia and refusing to eat, speech to see Endo: Patient has a documented history of diabetes but doesn't have any anti-hyperglycemics recorded on his medication list. He has several antipsychotic agents that can affect blood glucose and insulin sensitivity. Insulin requirements in the past 24 hours:  Lytes: Baseline labs reviewed and no additional supplement needed today. Will follow for possibility of refeeding per TPN protocol Renal: SCr 1.08 mg/dL, CrCl 80 to 90 mL/min Pulm:  Cards:  Hepatobil: Neuro: Patient is alert but not talking or eating. EEG with slowing, MRI pending. Neurology consulted and suspects some degree of metabolic encephalopathy due to psychiatric medications - psych is following. Patient has a history of schizoaffective disorder and bipolar disorder for which he takes clozapine and Depakote. ID:  TPN Access: PICC placement pending TPN start date: planned start is 11/18/17 Nutritional Goals (per RD recommendation on 11/18/16): start Clinimix 5/15 at 40 mL/hr, specific recommendations and goals to follow in note tomorrow kCal: Protein:  Fluid:  Goal TPN rate is to be determined by RD  Current Nutrition: aspiration precautions - DYS 1, hopefully starting TPN today  Plan:  Clinimix 5/15 at 40 mL/hr. This TPN provides 48 g of protein, 144 g of dextrose, and 0 g of lipids which provides 768 kCals per  day Electrolytes in TPN: none Add MVI, trace elements Add sensitive SSI and adjust as needed Stop IVMF (D5, NS, etc.) at time of TPN start Monitor TPN labs, per protocol F/U 11/19/17  Laural Benes, Pharm.D., BCPS Clinical Pharmacist 11/18/2017,2:03 PM

## 2017-11-18 NOTE — Progress Notes (Signed)
Brief Nutrition Note  Consult received for parenteral nutrition. Central Access: noted orders for PICC line placement today  Pharmacy to initiate Clinimix 5%AA/15%Dextrose at 5ml/hr with MVI and Trace Minerals at this time.  Full assessment and further recommendations to follow.  Admitting Dx: Abdominal distention [R14.0] Hypotension, unspecified hypotension type [I95.9]  Body mass index is 21.81 kg/m. Pt meets criteria for normal weight range based on current BMI.  Labs:  Recent Labs  Lab 11/16/17 0414 11/17/17 0436 11/17/17 1059 11/18/17 0709  NA 146* 147* 146*  --   K 4.4 4.4 4.1  --   CL 113* 115* 114*  --   CO2 26 27 27   --   BUN 36* 29* 27*  --   CREATININE 1.38* 1.09 1.09  --   CALCIUM 8.6* 8.9 8.9  --   MG  --   --   --  1.9  PHOS  --   --   --  2.8    Gradie Ohm A. Jimmye Norman, RD, LDN, CDE Pager: [phone] After hours Pager: [phone]

## 2017-11-18 NOTE — Progress Notes (Signed)
[facility] NAME: Austin Gates    MR#:  [id number]  DATE OF BIRTH:  [birth year]  SUBJECTIVE:  CHIEF COMPLAINT:   Chief Complaint  Patient presents with  . Leg Pain  Patient seen in room with the patient's sister and other family members, they are concerned for swallowing problems, patient nonambulatory, patient continues to not behave like himself, not talking, concerned about nutrition, noted malnourished state  REVIEW OF SYSTEMS:  CONSTITUTIONAL: No fever, fatigue or weakness.  EYES: No blurred or double vision.  EARS, NOSE, AND THROAT: No tinnitus or ear pain.  RESPIRATORY: No cough, shortness of breath, wheezing or hemoptysis.  CARDIOVASCULAR: No chest pain, orthopnea, edema.  GASTROINTESTINAL: No nausea, vomiting, diarrhea or abdominal pain.  GENITOURINARY: No dysuria, hematuria.  ENDOCRINE: No polyuria, nocturia,  HEMATOLOGY: No anemia, easy bruising or bleeding SKIN: No rash or lesion. MUSCULOSKELETAL: No joint pain or arthritis.   NEUROLOGIC: No tingling, numbness, weakness.  PSYCHIATRY: No anxiety or depression.   ROS  DRUG ALLERGIES:  No Known Allergies  VITALS:  Blood pressure 116/85, pulse 79, temperature (!) 94.2 F (34.6 C), temperature source Rectal, resp. rate 20, height 6\' 1"  (1.854 m), weight 75 kg, SpO2 100 %.  PHYSICAL EXAMINATION:  GENERAL:  57 y.o.-year-old patient lying in the bed with no acute distress.  EYES: Pupils equal, round, reactive to light and accommodation. No scleral icterus. Extraocular muscles intact.  HEENT: Head atraumatic, normocephalic. Oropharynx and nasopharynx clear.  NECK:  Supple, no jugular venous distention. No thyroid enlargement, no tenderness.  LUNGS: Normal breath sounds bilaterally, no wheezing, rales,rhonchi or crepitation. No use of accessory muscles of respiration.  CARDIOVASCULAR: S1, S2 normal. No murmurs, rubs, or gallops.  ABDOMEN: Soft, nontender, nondistended.  Bowel sounds present. No organomegaly or mass.  EXTREMITIES: No pedal edema, cyanosis, or clubbing.  NEUROLOGIC: Cranial nerves II through XII are intact. Muscle strength 5/5 in all extremities. Sensation intact. Gait not checked.  PSYCHIATRIC: The patient is alert and oriented x 3.  SKIN: No obvious rash, lesion, or ulcer.   Physical Exam LABORATORY PANEL:   CBC Recent Labs  Lab 11/18/17 0709  WBC 2.6*  HGB 11.0*  HCT 31.8*  PLT 63*   ------------------------------------------------------------------------------------------------------------------  Chemistries  Recent Labs  Lab 11/17/17 1059 11/18/17 0709  NA 146*  --   K 4.1  --   CL 114*  --   CO2 27  --   GLUCOSE 154*  --   BUN 27*  --   CREATININE 1.09  --   CALCIUM 8.9  --   MG  --  1.9  AST 39  --   ALT 24  --   ALKPHOS 62  --   BILITOT 0.5  --    ------------------------------------------------------------------------------------------------------------------  Cardiac Enzymes Recent Labs  Lab 11/15/17 1622  TROPONINI <0.03   ------------------------------------------------------------------------------------------------------------------  RADIOLOGY:  Ct Head Wo Contrast  Result Date: 11/17/2017 CLINICAL DATA:  Altered mental status EXAM: CT HEAD WITHOUT CONTRAST TECHNIQUE: Contiguous axial images were obtained from the base of the skull through the vertex without intravenous contrast. COMPARISON:  November 06, 2017 FINDINGS: Brain: The ventricles are normal in size and configuration. There is no appreciable intracranial mass, hemorrhage, extra-axial fluid collection, or midline shift. Brain parenchyma appears unremarkable. No evident acute infarct. Vascular: No hyperdense vessel. No appreciable vascular calcification. Skull: Bony calvarium appears intact. Sinuses/Orbits: There is mucosal thickening in several ethmoid air cells. Other visualized paranasal sinuses are clear.  Orbits appear symmetric  bilaterally. Other: There is mild opacification in several inferior mastoid air cells. Most of the mastoid air cells are clear. IMPRESSION: Mild inferior mastoid disease bilaterally. Mucosal thickening in several ethmoid air cells. Study otherwise unremarkable. Electronically Signed   By: Lowella Grip III M.D.   On: 11/17/2017 11:16   Ct Lumbar Spine Wo Contrast  Result Date: 11/17/2017 CLINICAL DATA:  Lower extremity weakness and multiple falls EXAM: CT LUMBAR SPINE WITHOUT CONTRAST TECHNIQUE: Multidetector CT imaging of the lumbar spine was performed without intravenous contrast administration. Multiplanar CT image reconstructions were also generated. COMPARISON:  None. FINDINGS: Segmentation: 5 lumbar type vertebrae. Alignment: Normal. Vertebrae: There is an intermediate sized Schmorl's node at the superior L5 endplate. There is endplate sclerosis at K4-8 and L5-S1, worst at the inferior L4 endplate. No acute fracture. No lytic or blastic lesion. Paraspinal and other soft tissues: Negative. Disc levels: T11-T12: No disc herniation or stenosis.  Normal facets. T12-L1: No disc herniation or stenosis.  Normal facets. L1-L2: No disc herniation or stenosis.  Normal facets. L2-L3: Mild disc degeneration and endplate spurring. Normal facets. No spinal canal or neural foraminal stenosis. L3-L4: Minimal endplate spurring. No disc herniation or stenosis. Mild facet hypertrophy. L4-L5: Mild facet hypertrophy. Endplate remodeling with small disc bulge and minimal spurring. No spinal canal stenosis. Mild bilateral foraminal narrowing. L5-S1: Normal disc. Moderate facet hypertrophy. No spinal canal stenosis. Mild bilateral foraminal narrowing. IMPRESSION: 1. No acute fracture or listhesis. 2. Mild bilateral foraminal stenosis at L4-5 and L5-S1, predominantly due to facet arthrosis. 3. L4-L5 and L5-S1 degenerative disc disease with endplate remodeling. 4. No spinal canal stenosis. Electronically Signed   By: Ulyses Jarred M.D.   On: 11/17/2017 17:50   Dg Chest Port 1 View  Result Date: 11/18/2017 CLINICAL DATA:  Hypothermia and altered mental status. Poor historian. Current smoker. EXAM: PORTABLE CHEST 1 VIEW COMPARISON:  11/15/2017 FINDINGS: Grossly unchanged cardiac silhouette and mediastinal contours given reduced lung volumes. Interval development potential small bilateral effusions with associated worsening bibasilar opacities, left greater than right. Mild pulmonary is congestion without frank evidence of edema. No pneumothorax. Ossicles are again noted superior to the left coracoid process. No acute osseus abnormalities. IMPRESSION: Suspected small bilateral effusions with worsening bibasilar opacities, left greater than right, atelectasis versus infiltrate. Further evaluation with a PA and lateral chest radiograph may be obtained as clinically indicated. Electronically Signed   By: Sandi Mariscal M.D.   On: 11/18/2017 08:58   [place] Ekg Site Rite  Result Date: 11/18/2017 If Site Rite image not attached, placement could not be confirmed due to current cardiac rhythm.   ASSESSMENT AND PLAN:  *Acute toxic metabolic encephalopathy  Likely due to psychotropic medications  Suspect Improved but not back to baseline  Neurology and psychiatry input appreciated, psychotropic meds were reduced, follow-up on MRI of the brain for further evaluation, EEG noted for slowing/no seizure activity, CT head noted for sinus disease, RPR was nonreactive, continue neurochecks per routine, aspiration/fall precautions while in house  *Acute ?  Dysphasia N.p.o. for now, speech therapy to see  *Acute on chronic moderate to severe protein calorie/energy malnutrition Dietary to see, start TPN-pharmacy to dose  *Acute kidney injury Resolved with IV fluids for rehydration  *Acute abnormal chest x-ray concerning for aspiration Check two-view chest x-ray, KUB, aspiration precautions, head of bed at 30 degrees at all times, speech  therapy to see  *Schizoaffective/bipolar disorder Psychiatry input appreciated-clozapine and Depakote reduced given concern for sedation related  to these medications   *Tobacco smoking abuse/dependency  Nicotine patch and cessation counseling when sensorium clears   All the records are reviewed and case discussed with Care Management/Social Workerr. Management plans discussed with the patient, family and they are in agreement.  CODE STATUS: full  TOTAL TIME TAKING CARE OF THIS PATIENT: 45 minutes.     POSSIBLE D/C IN 3 DAYS, DEPENDING ON CLINICAL CONDITION.   [name] Salary M.D on 11/18/2017   Between 7am to 6pm - Pager - [phone]  After 6pm go to [email or web address] - password [email or web address] [facility]  Office  [phone]  CC: Primary care physician; Raelyn Number, MD  Note: This dictation was prepared with Dragon dictation along with smaller phrase technology. Any transcriptional errors that result from this process are unintentional.

## 2017-11-19 ENCOUNTER — Inpatient Hospital Stay: Payer: Medicaid Other

## 2017-11-19 DIAGNOSIS — D61818 Other pancytopenia: Secondary | ICD-10-CM

## 2017-11-19 DIAGNOSIS — I959 Hypotension, unspecified: Secondary | ICD-10-CM

## 2017-11-19 LAB — DIFFERENTIAL
BASOS ABS: 0 10*3/uL (ref 0–0.1)
BASOS PCT: 0 %
Eosinophils Absolute: 0 10*3/uL (ref 0–0.7)
Eosinophils Relative: 0 %
LYMPHS ABS: 0.2 10*3/uL — AB (ref 1.0–3.6)
Lymphocytes Relative: 6 %
MONO ABS: 0.1 10*3/uL — AB (ref 0.2–1.0)
MONOS PCT: 3 %
NEUTROS ABS: 3 10*3/uL (ref 1.4–6.5)
Neutrophils Relative %: 91 %

## 2017-11-19 LAB — IRON AND TIBC
Iron: 22 ug/dL — ABNORMAL LOW (ref 45–182)
Saturation Ratios: 11 % — ABNORMAL LOW (ref 17.9–39.5)
TIBC: 194 ug/dL — ABNORMAL LOW (ref 250–450)
UIBC: 173 ug/dL

## 2017-11-19 LAB — COMPREHENSIVE METABOLIC PANEL
ALT: 29 U/L (ref 0–44)
ANION GAP: 4 — AB (ref 5–15)
AST: 36 U/L (ref 15–41)
Albumin: 2.4 g/dL — ABNORMAL LOW (ref 3.5–5.0)
Alkaline Phosphatase: 65 U/L (ref 38–126)
BUN: 25 mg/dL — ABNORMAL HIGH (ref 6–20)
CALCIUM: 9 mg/dL (ref 8.9–10.3)
CO2: 28 mmol/L (ref 22–32)
Chloride: 112 mmol/L — ABNORMAL HIGH (ref 98–111)
Creatinine, Ser: 1.17 mg/dL (ref 0.61–1.24)
GFR calc Af Amer: >60 mL/min (ref >60)
Glucose, Bld: 118 mg/dL — ABNORMAL HIGH (ref 70–99)
POTASSIUM: 4.3 mmol/L (ref 3.5–5.1)
Sodium: 144 mmol/L (ref 135–145)
Total Bilirubin: 0.6 mg/dL (ref 0.3–1.2)
Total Protein: 5.2 g/dL — ABNORMAL LOW (ref 6.5–8.1)

## 2017-11-19 LAB — URINALYSIS, ROUTINE W REFLEX MICROSCOPIC
BILIRUBIN URINE: NEGATIVE
Glucose, UA: NEGATIVE mg/dL
Hgb urine dipstick: NEGATIVE
KETONES UR: NEGATIVE mg/dL
LEUKOCYTES UA: NEGATIVE
NITRITE: NEGATIVE
PROTEIN: NEGATIVE mg/dL
Specific Gravity, Urine: 1.018 (ref 1.005–1.030)
pH: 5 (ref 5.0–8.0)

## 2017-11-19 LAB — GLUCOSE, CAPILLARY
GLUCOSE-CAPILLARY: 108 mg/dL — AB (ref 70–99)
GLUCOSE-CAPILLARY: 73 mg/dL (ref 70–99)
GLUCOSE-CAPILLARY: 85 mg/dL (ref 70–99)
GLUCOSE-CAPILLARY: 88 mg/dL (ref 70–99)
Glucose-Capillary: 95 mg/dL (ref 70–99)

## 2017-11-19 LAB — CBC
HEMATOCRIT: 31.6 % — AB (ref 40.0–52.0)
HEMOGLOBIN: 11 g/dL — AB (ref 13.0–18.0)
MCH: 32.8 pg (ref 26.0–34.0)
MCHC: 34.9 g/dL (ref 32.0–36.0)
MCV: 94.1 fL (ref 80.0–100.0)
Platelets: 47 10*3/uL — ABNORMAL LOW (ref 150–440)
RBC: 3.36 MIL/uL — AB (ref 4.40–5.90)
RDW: 15.2 % — ABNORMAL HIGH (ref 11.5–14.5)
WBC: 3.3 10*3/uL — AB (ref 3.8–10.6)

## 2017-11-19 LAB — TSH: TSH: 2.246 u[IU]/mL (ref 0.350–4.500)

## 2017-11-19 LAB — RETICULOCYTES
RBC.: 3.13 MIL/uL — ABNORMAL LOW (ref 4.40–5.90)
RETIC COUNT ABSOLUTE: 25 10*3/uL (ref 19.0–183.0)
RETIC CT PCT: 0.8 % (ref 0.4–3.1)

## 2017-11-19 LAB — TRIGLYCERIDES: TRIGLYCERIDES: 79 mg/dL (ref <150)

## 2017-11-19 LAB — PHOSPHORUS: Phosphorus: 3.3 mg/dL (ref 2.5–4.6)

## 2017-11-19 LAB — FOLATE: FOLATE: 92.4 ng/mL (ref >5.9)

## 2017-11-19 LAB — LACTATE DEHYDROGENASE: LDH: 114 U/L (ref 98–192)

## 2017-11-19 LAB — FERRITIN: Ferritin: 146 ng/mL (ref 24–336)

## 2017-11-19 LAB — TECHNOLOGIST SMEAR REVIEW

## 2017-11-19 LAB — PREALBUMIN: PREALBUMIN: 12.1 mg/dL — AB (ref 18–38)

## 2017-11-19 LAB — MAGNESIUM: Magnesium: 1.6 mg/dL — ABNORMAL LOW (ref 1.7–2.4)

## 2017-11-19 MED ORDER — TRACE MINERALS CR-CU-MN-SE-ZN 10-1000-500-60 MCG/ML IV SOLN
INTRAVENOUS | Status: AC
  Administered 2017-11-19: 19:00:00 via INTRAVENOUS
  Filled 2017-11-19: qty 1992

## 2017-11-19 MED ORDER — MAGNESIUM SULFATE 2 GM/50ML IV SOLN
2.0000 g | Freq: Once | INTRAVENOUS | Status: AC
  Administered 2017-11-19: 2 g via INTRAVENOUS
  Filled 2017-11-19: qty 50

## 2017-11-19 MED ORDER — FAT EMULSION PLANT BASED 20 % IV EMUL
250.0000 mL | INTRAVENOUS | Status: AC
  Administered 2017-11-19: 250 mL via INTRAVENOUS
  Filled 2017-11-19: qty 250

## 2017-11-19 MED ORDER — MAGNESIUM SULFATE 2 GM/50ML IV SOLN: 2.0000 g | Freq: Once | INTRAVENOUS | Status: DC

## 2017-11-19 MED ORDER — LORAZEPAM 2 MG/ML IJ SOLN
1.0000 mg | Freq: Once | INTRAMUSCULAR | Status: AC
  Administered 2017-11-19: 1 mg via INTRAVENOUS
  Filled 2017-11-19: qty 1

## 2017-11-19 MED ORDER — HALOPERIDOL LACTATE 5 MG/ML IJ SOLN
2.0000 mg | Freq: Three times a day (TID) | INTRAMUSCULAR | Status: DC | PRN
  Administered 2017-11-24: 2 mg via INTRAMUSCULAR
  Filled 2017-11-19 (×2): qty 0.4

## 2017-11-19 NOTE — Progress Notes (Signed)
   11/19/17 1350  Clinical Encounter Type  Visited With Patient and family together  Visit Type Initial (respond to order requisition)  Referral From Physician  Consult/Referral To Chaplain  Recommendations ongoing follow up  [place] met patient's sister Olegario Shearer and patient's caregiver Joe at bedside.  Sister requested prayers for healing and for patient's mind and body to align.  Chaplain joined hands with them, began prayer and left space for them to pray as well.  [name] spoke of patient's faith and the role prayer has played.  Chaplain offered to get a prayer shawl for patient, conversation around colors and meanings for patient.  Chaplain returned to drape shawl over patient with a prayer.  Chaplain utilized active and reflective listening as patient's sister reflected on patient's health, her role as caregiver, and how they 'always had each other's backs.'  [name] open to ongoing chaplain support and would appreciate a visit later as able.

## 2017-11-19 NOTE — Progress Notes (Addendum)
PHARMACY - ADULT TOTAL PARENTERAL NUTRITION CONSULT NOTE   Pharmacy Consult for TPN Indication: malnutrition  Patient Measurements: Height: 6\' 1"  (185.4 cm) Weight: 165 lb 12.6 oz (75.2 kg) IBW/kg (Calculated) : 79.9 TPN AdjBW (KG): 65.8 Body mass index is 21.87 kg/m. Usual Weight:   Assessment: 57 yom cc leg pain. Patient's family is concerned for swallowing problems and malnutrition.  GI: Dysphagia and refusing to eat, speech to see Endo: Patient has a documented history of diabetes but doesn't have any anti-hyperglycemics recorded on his medication list. He has several antipsychotic agents that can affect blood glucose and insulin sensitivity. Insulin requirements in the past 24 hours:  Lytes: Mg: 1.6, Phos: 3.3, K: 4.3  Will follow for possibility of refeeding per TPN protocol Renal: SCr 1.17 mg/dL, CrCl 75 mL/min Pulm:  Cards:  Hepatobil: Neuro: Patient is alert but not talking or eating. EEG with slowing, MRI pending. Neurology consulted and suspects some degree of metabolic encephalopathy due to psychiatric medications - psych is following. Patient has a history of schizoaffective disorder and bipolar disorder for which he takes clozapine and Depakote. ID:  TPN Access: PICC placement pending TPN start date: planned start is 11/18/17 Nutritional Goals (per RD recommendation on 11/19/17): 2233 kcal, 100 grams of protein, 2232 mL fluid daily including lipids.  Current Nutrition: TPN  Plan:  Advance to goal TPN regimen of Clinimix E 5/20 at 83 mL/hr + 20% ILE at 20 mL/hr over 12 hours.  Add MVI, trace elements, and thiamine 100mg  daily x 3 days (9/29-10/1)  Magnesium 2g IV x 1 dose ordered this AM.  Sensitive SSI ordered., adjust as needed  Monitor TPN labs, per protocol   Cozette Braggs M Koriana Stepien, Pharm.D., BCPS Clinical Pharmacist 11/19/2017,1:54 PM

## 2017-11-19 NOTE — Progress Notes (Signed)
Initial Nutrition Assessment  DOCUMENTATION CODES:   Severe malnutrition in context of chronic illness  INTERVENTION:  Plan is to continue TPN for now.  Advance to goal TPN regimen of Clinimix E 5/20 at 83 mL/hr + 20% ILE at 20 mL/hr over 12 hours. Provides 2233 kcal, 100 grams of protein, 2232 mL fluid daily including lipids.  Provide adult MVI and trace elements as daily TPN additives.  Provide thiamine 100 mg daily for 3 days in TPN due to risk for refeeding syndrome.  Monitor magnesium, potassium, and phosphorus daily for at least 3 days, MD to replete as needed, as pt is at risk for refeeding syndrome given severe malnutrition.  Patient would actually benefit more from placement of small-bore NGT for initiation of tube feeds instead of receiving TPN. Using the GI system for enteral feeding helps prevent gut mucosal atrophy, reduces septic complications by decreasing bacterial translocation, stimulates gut motility therefore reducing the risk of ileus, and enhances the intestinal immune system.  NUTRITION DIAGNOSIS:   Severe Malnutrition related to chronic illness(etiology unknown at this time) as evidenced by severe fat depletion, moderate muscle depletion, severe muscle depletion.  GOAL:   Patient will meet greater than or equal to 90% of their needs  MONITOR:   Diet advancement, Labs, Weight trends, I & O's  REASON FOR ASSESSMENT:   Consult Assessment of nutrition requirement/status, New TPN/TNA  ASSESSMENT:   57 year old male with PMHx of schizoaffective/bipolar disorder, DM, HTN who is admitted with acute toxic metabolic encephalopathy, acute dysphagia, aphagia, pancytopenia, AKI.   -Pending SLP evaluation.  Met with patient and his sister/guardian at bedside. Patient unable to provide any history in setting of encephalopathy. Sister reports patient is now having dysphagia and aphasia which are both new for him. He lives at a group home. She reports he used to have  a very good appetite and intake and would eat throughout the day. She is unsure how he has been eating at the group home but she has noticed a significant weight loss. RD noted on NFPE patient is edentulous. Sister reports he lost his dentures.  Sister reports patient has lost a significant amount of weight but she is unsure what he used to weigh. She showed RD pictures of patient when he was at his [facility] and he had significantly more muscle and subcutaneous fat then.  At a podiatry office visit on 08/04/2015 patient was 97.5 kg (215 lbs). Patient was 70.3 kg on admission. Weight likely trending up with fluid.  IV Access: right basilic double lumen PICC placed 9/28; ECG technology used to confirm PICC in SVC  TPN: last night patient was started on Clinimix 5/15 no electrolytes at 40 mL/hr  Medications reviewed and include: Novolog 0-9 units Q6hrs. Patient ordered for multiple PO medications unable to take at this time.  Labs reviewed: CBG 60-108, Chloride 112, BUN 25, Magnesium 1.6. Potassium and Phosphorus WNL today.  I/O: UOP not measured  Weight trend: 75.2 kg on 9/29; +4.9 kg from admission weight  Discussed with RN and MD. TPN was started as patient was unable to swallow safely. He is now NPO. Plan is to continue TPN until SLP evaluation can be held. Discussed recommendation for placement of NGT for enteral nutrition.  NUTRITION - FOCUSED PHYSICAL EXAM:    Most Recent Value  Orbital Region  Severe depletion  Upper Arm Region  Severe depletion  Thoracic and Lumbar Region  Severe depletion  Buccal Region  Severe depletion  [place] Region  Severe depletion  Clavicle Bone Region  Severe depletion  Clavicle and Acromion Bone Region  Severe depletion  Scapular Bone Region  Moderate depletion  Dorsal Hand  Moderate depletion  Patellar Region  Severe depletion  Anterior Thigh Region  Severe depletion  Posterior Calf Region  Severe depletion  Edema (RD Assessment)  Mild  Hair  Reviewed   Eyes  Unable to assess  Mouth  Reviewed [edentulous]  Skin  Reviewed  Nails  Reviewed       Diet Order:   Diet Order            Diet NPO time specified  Diet effective now              EDUCATION NEEDS:   Not appropriate for education at this time  Skin:  Skin Assessment: Reviewed RN Assessment  Last BM:  11/19/2017 - large type 4  Height:   Ht Readings from Last 1 Encounters:  11/15/17 _0  (1.854 m)    Weight:   Wt Readings from Last 1 Encounters:  11/19/17 75.2 kg    Ideal Body Weight:  83.6 kg  BMI:  Body mass index is 21.87 kg/m.  Estimated Nutritional Needs:   Kcal:  1975-2300 (MSJ x 1.2-1.4)  Protein:  90-105 grams (1.2-1.4 grams/kg)  Fluid:  2-2.3 L/day (1 mL/kcal)  Willey Blade, MS, RD, LDN Office: [phone] Pager: [phone] After Hours/Weekend Pager: [phone]

## 2017-11-19 NOTE — Progress Notes (Signed)
   11/19/17 1930  Clinical Encounter Type  Visited With Patient  Visit Type Follow-up  Spiritual Encounters  Spiritual Needs Prayer   Chaplain followed up based on earlier conversation with patient's sister Olegario Shearer.  No family present in room; staff meeting regarding shift change.  Chaplain offered silent and energetic prayer at patient's bedside for him, his family and the care team.

## 2017-11-19 NOTE — Progress Notes (Addendum)
[facility] NAME: Jafari Mckillop    MR#:  [id number]  DATE OF BIRTH:  07-10-1960  SUBJECTIVE:  CHIEF COMPLAINT:   Chief Complaint  Patient presents with  . Leg Pain  Patient seen in room with the patient's sister, patient remains very lethargic, arouses to voice/stimulation, will say a few words, moves all extremities spontaneously, follow-up on MRI of the brain, for CT scan of the chest to evaluate for further evaluation of abnormal chest x-ray, continue n.p.o. status given problems with dysphasia/difficulty with oral secretions, consult oncology given pancytopenia,, replete magnesium, continue TPN  REVIEW OF SYSTEMS:  CONSTITUTIONAL: No fever, fatigue or weakness.  EYES: No blurred or double vision.  EARS, NOSE, AND THROAT: No tinnitus or ear pain.  RESPIRATORY: No cough, shortness of breath, wheezing or hemoptysis.  CARDIOVASCULAR: No chest pain, orthopnea, edema.  GASTROINTESTINAL: No nausea, vomiting, diarrhea or abdominal pain.  GENITOURINARY: No dysuria, hematuria.  ENDOCRINE: No polyuria, nocturia,  HEMATOLOGY: No anemia, easy bruising or bleeding SKIN: No rash or lesion. MUSCULOSKELETAL: No joint pain or arthritis.   NEUROLOGIC: No tingling, numbness, weakness.  PSYCHIATRY: No anxiety or depression.   ROS  DRUG ALLERGIES:  No Known Allergies  VITALS:  Blood pressure 107/85, pulse 81, temperature (!) 96.6 F (35.9 C), resp. rate (!) 22, height 6\' 1"  (1.854 m), weight 75.2 kg, SpO2 95 %.  PHYSICAL EXAMINATION:  GENERAL:  57 y.o.-year-old patient lying in the bed with no acute distress.  EYES: Pupils equal, round, reactive to light and accommodation. No scleral icterus. Extraocular muscles intact.  HEENT: Head atraumatic, normocephalic. Oropharynx and nasopharynx clear.  NECK:  Supple, no jugular venous distention. No thyroid enlargement, no tenderness.  LUNGS: Normal breath sounds bilaterally, no wheezing,  rales,rhonchi or crepitation. No use of accessory muscles of respiration.  CARDIOVASCULAR: S1, S2 normal. No murmurs, rubs, or gallops.  ABDOMEN: Soft, nontender, nondistended. Bowel sounds present. No organomegaly or mass.  EXTREMITIES: No pedal edema, cyanosis, or clubbing.  NEUROLOGIC: Cranial nerves II through XII are intact. Muscle strength 5/5 in all extremities. Sensation intact. Gait not checked.  PSYCHIATRIC: The patient is alert and oriented x 3.  SKIN: No obvious rash, lesion, or ulcer.   Physical Exam LABORATORY PANEL:   CBC Recent Labs  Lab 11/19/17 0432  WBC 3.3*  HGB 11.0*  HCT 31.6*  PLT 47*   ------------------------------------------------------------------------------------------------------------------  Chemistries  Recent Labs  Lab 11/19/17 0432  NA 144  K 4.3  CL 112*  CO2 28  GLUCOSE 118*  BUN 25*  CREATININE 1.17  CALCIUM 9.0  MG 1.6*  AST 36  ALT 29  ALKPHOS 65  BILITOT 0.6   ------------------------------------------------------------------------------------------------------------------  Cardiac Enzymes Recent Labs  Lab 11/15/17 1622  TROPONINI <0.03   ------------------------------------------------------------------------------------------------------------------  RADIOLOGY:  Dg Chest 2 View  Result Date: 11/18/2017 CLINICAL DATA:  Acute toxic metabolic encephalopathy. EXAM: CHEST - 2 VIEW COMPARISON:  Chest film earlier in the day. FINDINGS: Low lung volumes accentuate the cardiac silhouette which is probably within normal limits, given the degree of inspiration. BILATERAL effusions are increased, particularly on the RIGHT, with associated increasing bibasilar opacities which could represent atelectasis or infiltrate. Tortuous aorta. No osseous findings of significance. IMPRESSION: BILATERAL RIGHT greater than LEFT pleural effusions. Bibasilar opacities which could represent atelectasis or pneumonia. Worsening aeration from priors.  Electronically Signed   By: Staci Righter M.D.   On: 11/18/2017 13:59   Dg Abd 1 View  Result Date:  11/18/2017 CLINICAL DATA:  Acute toxic metabolic encephalopathy. Abdominal pain. EXAM: ABDOMEN - 1 VIEW COMPARISON:  CT lumbar spine 11/17/2016, abdomen radiograph 11/15/2017. FINDINGS: There is extreme constipation, distended colon filled with stool, from the RIGHT colon to the rectum. This is confirmed on prior CT lumbar. Mildly prominent small bowel loops could represent ileus or early obstruction. Prominent stomach. Lower lumbar facet arthropathy. No visible calcifications. IMPRESSION: Extreme constipation. Mildly prominent small bowel loops could represent ileus or developing early obstruction. Electronically Signed   By: Staci Righter M.D.   On: 11/18/2017 13:57   Ct Lumbar Spine Wo Contrast  Result Date: 11/17/2017 CLINICAL DATA:  Lower extremity weakness and multiple falls EXAM: CT LUMBAR SPINE WITHOUT CONTRAST TECHNIQUE: Multidetector CT imaging of the lumbar spine was performed without intravenous contrast administration. Multiplanar CT image reconstructions were also generated. COMPARISON:  None. FINDINGS: Segmentation: 5 lumbar type vertebrae. Alignment: Normal. Vertebrae: There is an intermediate sized Schmorl's node at the superior L5 endplate. There is endplate sclerosis at Q6-8 and L5-S1, worst at the inferior L4 endplate. No acute fracture. No lytic or blastic lesion. Paraspinal and other soft tissues: Negative. Disc levels: T11-T12: No disc herniation or stenosis.  Normal facets. T12-L1: No disc herniation or stenosis.  Normal facets. L1-L2: No disc herniation or stenosis.  Normal facets. L2-L3: Mild disc degeneration and endplate spurring. Normal facets. No spinal canal or neural foraminal stenosis. L3-L4: Minimal endplate spurring. No disc herniation or stenosis. Mild facet hypertrophy. L4-L5: Mild facet hypertrophy. Endplate remodeling with small disc bulge and minimal spurring. No spinal  canal stenosis. Mild bilateral foraminal narrowing. L5-S1: Normal disc. Moderate facet hypertrophy. No spinal canal stenosis. Mild bilateral foraminal narrowing. IMPRESSION: 1. No acute fracture or listhesis. 2. Mild bilateral foraminal stenosis at L4-5 and L5-S1, predominantly due to facet arthrosis. 3. L4-L5 and L5-S1 degenerative disc disease with endplate remodeling. 4. No spinal canal stenosis. Electronically Signed   By: Ulyses Jarred M.D.   On: 11/17/2017 17:50   Dg Chest Port 1 View  Result Date: 11/18/2017 CLINICAL DATA:  Hypothermia and altered mental status. Poor historian. Current smoker. EXAM: PORTABLE CHEST 1 VIEW COMPARISON:  11/15/2017 FINDINGS: Grossly unchanged cardiac silhouette and mediastinal contours given reduced lung volumes. Interval development potential small bilateral effusions with associated worsening bibasilar opacities, left greater than right. Mild pulmonary is congestion without frank evidence of edema. No pneumothorax. Ossicles are again noted superior to the left coracoid process. No acute osseus abnormalities. IMPRESSION: Suspected small bilateral effusions with worsening bibasilar opacities, left greater than right, atelectasis versus infiltrate. Further evaluation with a PA and lateral chest radiograph may be obtained as clinically indicated. Electronically Signed   By: Sandi Mariscal M.D.   On: 11/18/2017 08:58   [place] Ekg Site Rite  Result Date: 11/18/2017 If Site Rite image not attached, placement could not be confirmed due to current cardiac rhythm.   ASSESSMENT AND PLAN:  *Acute toxic metabolic encephalopathy  Suspect due to psychotropic medications  Neurology and psychiatry input appreciated, psychotropic meds were reduced, follow-up on MRI of the brain for further evaluation, EEG noted for slowing/no seizure activity, CT head noted for sinus disease, RPR was nonreactive, follow-up on MRI of the brain, continue neurochecks per routine, aspiration/fall precautions  while in house  *Acute Dysphasia Continue n.p.o. status, TPN, speech therapy to evaluate/treat  *Acute on chronic moderate to severe protein calorie/energy malnutrition Dietary consulted, continue TPN-pharmacy to dose, check CEA, AFP, CA 19-9  *Pancytopenia Check abdominal ultrasound, consult oncology/hematology for expert  opinion, anemia work-up, check for antiplatelet antibodies, CBC daily, transfuse as needed  *Acute kidney injury Resolved with IV fluids for rehydration  *Acute abnormal chest x-ray concerning for aspiration Chest x-ray noted for atelectasis versus pneumonia-we will proceed with CT of the chest for further evaluation, continue aspiration precautions, head of bed at 30 degrees at all times, speech therapy to see  *Schizoaffective/bipolar disorder Psychiatry input appreciated  Hold psychotropic meds given severe encephalopathy    *Tobacco smoking abuse/dependency  Nicotine patch and cessation counseling when sensorium clears   *Acute hypomagnesemia Replete with IV magnesium  All the records are reviewed and case discussed with Care Management/Social Workerr. Management plans discussed with the patient, family and they are in agreement.  CODE STATUS: full  TOTAL TIME TAKING CARE OF THIS PATIENT: 45 minutes.   POSSIBLE D/C IN 3 DAYS, DEPENDING ON CLINICAL CONDITION.   [name] Eesa Justiss M.D on 11/19/2017   Between 7am to 6pm - Pager - [phone]  After 6pm go to [email or web address] - password [email or web address] [facility]  Office  [phone]  CC: Primary care physician; Raelyn Number, MD  Note: This dictation was prepared with Dragon dictation along with smaller phrase technology. Any transcriptional errors that result from this process are unintentional.

## 2017-11-19 NOTE — Progress Notes (Signed)
Family Meeting Note  Advance Directive:yes  Today a meeting took place with the Patient and sister/guardian.  Patient is unable to participate   The following clinical team members were present during this meeting:MD  The following were discussed:Patient's diagnosis: Malnutrition, pancytopenia, schizoaffective disorder, encephalopathy, dysphagia, Patient's progosis: Unable to determine and Goals for treatment: Full Code  Additional follow-up to be provided: prn  Time spent during discussion:20 minutes  Gorden Harms, MD

## 2017-11-19 NOTE — Consult Note (Signed)
Hematology/Oncology Consult note [facility] [phone] [phone]  Patient Care Team: Raelyn Number, MD as PCP - General (Internal Medicine)   Name of the patient: Austin Gates  [id number]  05/15/1960   Date of visit: 11/19/17 REASON FOR COSULTATION:  pancytopenia History of presenting illness-  57 y.o. male with PMH listed at below who was sent from assisted living to ER fo revaluation of altered mental status.  Sister at bedside. Reports increased drowsiness, confusion, altered mental status since 11/13/2017. Also weight loss despite good appetite.  Patient has psychiatric disorder and has been on Clozapine and Depakote chronically. Recently Desmopressin was added for nocturnal enuresis. Lithium was discontinued recently.  Patient was noted to have hypotension with SBP in 70s. Improved with hydration.  Chest Xray was negative for cardiopulmonary disease. UA negative for UTI.  Abdominal Xray showed large stool burden.  Blood work also note decreased platelet counts, wbc and anemia. Oncology was consulted for further evaluation.  Patient was seen at bedside, remains confused and not able to provide any history. Sister at bedside.   Review of Systems  Unable to perform ROS: Mental status change  Constitutional: Positive for weight loss.  Neurological:       Confusion, drowsiness    No Known Allergies  Patient Active Problem List   Diagnosis Date Noted  . ARF (acute renal failure) ([place]) 11/15/2017  . Elevated lithium level 11/06/2017  . Fall 11/06/2017  . Schizoaffective disorder, bipolar type ([place]) 10/04/2016  . Tobacco use disorder 09/30/2016  . 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 Diabetes mellitus without complication ([place])   . Hypertension      Past Surgical History:  Procedure Laterality Date  . KNEE ARTHROSCOPY      Social History   Socioeconomic  History  . Marital status: Single    Spouse name: Not on file  . Number of children: Not on file  . Years of education: Not on file  . Highest education level: Not on file  Occupational History  . Not on file  Social Needs  . Financial resource strain: Not on file  . Food insecurity:    Worry: Not on file    Inability: Not on file  . Transportation needs:    Medical: Not on file    Non-medical: Not on file  Tobacco Use  . Smoking status: Current Every Day Smoker    Packs/day: 1.00    Types: Cigarettes  . Smokeless tobacco: Never Used  Substance and Sexual Activity  . Alcohol use: No  . Drug use: No  . Sexual activity: Never  Lifestyle  . Physical activity:    Days per week: Not on file    Minutes per session: Not on file  . Stress: Not on file  Relationships  . Social connections:    Talks on phone: Not on file    Gets together: Not on file    Attends religious service: Not on file    Active member of club or organization: Not on file    Attends meetings of clubs or organizations: Not on file    Relationship status: Not on file  . Intimate partner violence:    Fear of current or ex partner: Not on file    Emotionally abused: Not on file    Physically abused: Not on file    Forced sexual activity: Not on file  Other Topics Concern  .  Not on file  Social History Narrative  . Not on file     Family History  Problem Relation Age of Onset  . Diabetes Mother      Current Facility-Administered Medications:  .  acetaminophen (TYLENOL) tablet 650 mg, 650 mg, Oral, Q6H PRN **OR** acetaminophen (TYLENOL) suppository 650 mg, 650 mg, Rectal, Q6H PRN, Amelia Jo, MD .  bisacodyl (DULCOLAX) EC tablet 5 mg, 5 mg, Oral, Daily PRN, Amelia Jo, MD, 5 mg at 11/17/17 0954 .  cholecalciferol (VITAMIN D) tablet 5,000 Units, 5,000 Units, Oral, Daily, Amelia Jo, MD, 5,000 Units at 11/17/17 0750 .  docusate sodium (COLACE) capsule 100 mg, 100 mg, Oral, BID, Amelia Jo,  MD, 100 mg at 11/17/17 0751 .  haloperidol lactate (HALDOL) injection 2 mg, 2 mg, Intramuscular, Q8H PRN, Salary, Montell D, MD .  insulin aspart (novoLOG) injection 0-9 Units, 0-9 Units, Subcutaneous, Q6H, Salary, Montell D, MD .  midodrine (PROAMATINE) tablet 5 mg, 5 mg, Oral, TID WC, Dustin Flock, MD, 5 mg at 11/17/17 1707 .  nicotine (NICODERM CQ - dosed in mg/24 hours) patch 14 mg, 14 mg, Transdermal, Daily, Salary, Montell D, MD .  ondansetron (ZOFRAN) tablet 4 mg, 4 mg, Oral, Q6H PRN **OR** ondansetron (ZOFRAN) injection 4 mg, 4 mg, Intravenous, Q6H PRN, Amelia Jo, MD .  phenol (CHLORASEPTIC) mouth spray 2 spray, 2 spray, Mouth/Throat, PRN, Epifanio Lesches, MD .  polyethylene glycol (MIRALAX / GLYCOLAX) packet 17 g, 17 g, Oral, Darl Householder, MD, 17 g at 11/17/17 0751 .  sodium chloride flush (NS) 0.9 % injection 10-40 mL, 10-40 mL, Intracatheter, Q12H, Alexis Goodell, MD, 20 mL at 11/19/17 1010 .  sodium chloride flush (NS) 0.9 % injection 10-40 mL, 10-40 mL, Intracatheter, PRN, Alexis Goodell, MD .  TPN [facility]) Adult without lytes, , Intravenous, Continuous TPN, Salary, Montell D, MD, Last Rate: 40 mL/hr at 11/19/17 1011   Physical exam: ECOG  Vitals:   11/18/17 1958 11/19/17 0435 11/19/17 0500 11/19/17 0830  BP: 110/79 107/85    Pulse: 84 81    Resp: (!) 24 (!) 22    Temp: 98.1 F (36.7 C) (!) 97.5 F (36.4 C)  (!) 96.6 F (35.9 C)  TempSrc: Oral Oral    SpO2: 96% 95%    Weight:   165 lb 12.6 oz (75.2 kg)   Height:       Physical Exam  Constitutional: No distress.  HENT:  Head: Normocephalic and atraumatic.  Eyes: No scleral icterus.  Neck: Neck supple.  Cardiovascular: Normal rate.  No murmur heard. Pulmonary/Chest: Effort normal. No respiratory distress.  diminished breath sound bilaterally.   Abdominal: Soft. Bowel sounds are normal. He exhibits distension.  Musculoskeletal: Normal range of motion.  Neurological:  Lathargic, not  answering any questions or following command.         CMP Latest Ref Rng & Units 11/19/2017  Glucose 70 - 99 mg/dL 118(H)  BUN 6 - 20 mg/dL 25(H)  Creatinine 0.61 - 1.24 mg/dL 1.17  Sodium 135 - 145 mmol/L 144  Potassium 3.5 - 5.1 mmol/L 4.3  Chloride 98 - 111 mmol/L 112(H)  CO2 22 - 32 mmol/L 28  Calcium 8.9 - 10.3 mg/dL 9.0  Total Protein 6.5 - 8.1 g/dL 5.2(L)  Total Bilirubin 0.3 - 1.2 mg/dL 0.6  Alkaline Phos 38 - 126 U/L 65  AST 15 - 41 U/L 36  ALT 0 - 44 U/L 29   CBC Latest Ref Rng & Units 11/19/2017  WBC  3.8 - 10.6 K/uL 3.3(L)  Hemoglobin 13.0 - 18.0 g/dL 11.0(L)  Hematocrit 40.0 - 52.0 % 31.6(L)  Platelets 150 - 440 K/uL 47(L)   RADIOGRAPHIC STUDIES: I have personally reviewed the radiological images as listed and agreed with the findings in the report.  Dg Chest 2 View  Result Date: 11/18/2017 CLINICAL DATA:  Acute toxic metabolic encephalopathy. EXAM: CHEST - 2 VIEW COMPARISON:  Chest film earlier in the day. FINDINGS: Low lung volumes accentuate the cardiac silhouette which is probably within normal limits, given the degree of inspiration. BILATERAL effusions are increased, particularly on the RIGHT, with associated increasing bibasilar opacities which could represent atelectasis or infiltrate. Tortuous aorta. No osseous findings of significance. IMPRESSION: BILATERAL RIGHT greater than LEFT pleural effusions. Bibasilar opacities which could represent atelectasis or pneumonia. Worsening aeration from priors. Electronically Signed   By: Staci Righter M.D.   On: 11/18/2017 13:59   Dg Chest 2 View  Result Date: 11/15/2017 CLINICAL DATA:  Weakness, leg pain. EXAM: CHEST - 2 VIEW COMPARISON:  None. FINDINGS: The heart size and mediastinal contours are within normal limits. Both lungs are clear. No pneumothorax or pleural effusion is noted. The visualized skeletal structures are unremarkable. IMPRESSION: No active cardiopulmonary disease. Electronically Signed   By: Marijo Conception, M.D.   On: 11/15/2017 16:55   Dg Abd 1 View  Result Date: 11/18/2017 CLINICAL DATA:  Acute toxic metabolic encephalopathy. Abdominal pain. EXAM: ABDOMEN - 1 VIEW COMPARISON:  CT lumbar spine 11/17/2016, abdomen radiograph 11/15/2017. FINDINGS: There is extreme constipation, distended colon filled with stool, from the RIGHT colon to the rectum. This is confirmed on prior CT lumbar. Mildly prominent small bowel loops could represent ileus or early obstruction. Prominent stomach. Lower lumbar facet arthropathy. No visible calcifications. IMPRESSION: Extreme constipation. Mildly prominent small bowel loops could represent ileus or developing early obstruction. Electronically Signed   By: Staci Righter M.D.   On: 11/18/2017 13:57   Ct Head Wo Contrast  Result Date: 11/17/2017 CLINICAL DATA:  Altered mental status EXAM: CT HEAD WITHOUT CONTRAST TECHNIQUE: Contiguous axial images were obtained from the base of the skull through the vertex without intravenous contrast. COMPARISON:  November 06, 2017 FINDINGS: Brain: The ventricles are normal in size and configuration. There is no appreciable intracranial mass, hemorrhage, extra-axial fluid collection, or midline shift. Brain parenchyma appears unremarkable. No evident acute infarct. Vascular: No hyperdense vessel. No appreciable vascular calcification. Skull: Bony calvarium appears intact. Sinuses/Orbits: There is mucosal thickening in several ethmoid air cells. Other visualized paranasal sinuses are clear. Orbits appear symmetric bilaterally. Other: There is mild opacification in several inferior mastoid air cells. Most of the mastoid air cells are clear. IMPRESSION: Mild inferior mastoid disease bilaterally. Mucosal thickening in several ethmoid air cells. Study otherwise unremarkable. Electronically Signed   By: Lowella Grip III M.D.   On: 11/17/2017 11:16   Ct Head Wo Contrast  Result Date: 11/06/2017 CLINICAL DATA:  Ataxia, head trauma.  EXAM: CT HEAD WITHOUT CONTRAST TECHNIQUE: Contiguous axial images were obtained from the base of the skull through the vertex without intravenous contrast. COMPARISON:  09/17/2016 FINDINGS: Brain: No acute intracranial abnormality. Specifically, no hemorrhage, hydrocephalus, mass lesion, acute infarction, or significant intracranial injury. Vascular: No hyperdense vessel or unexpected calcification. Skull: No acute calvarial abnormality. Sinuses/Orbits: Visualized paranasal sinuses and mastoids clear. Orbital soft tissues unremarkable. Other: None IMPRESSION: No acute intracranial abnormality. Electronically Signed   By: Rolm Baptise M.D.   On: 11/06/2017 14:56   Ct  Lumbar Spine Wo Contrast  Result Date: 11/17/2017 CLINICAL DATA:  Lower extremity weakness and multiple falls EXAM: CT LUMBAR SPINE WITHOUT CONTRAST TECHNIQUE: Multidetector CT imaging of the lumbar spine was performed without intravenous contrast administration. Multiplanar CT image reconstructions were also generated. COMPARISON:  None. FINDINGS: Segmentation: 5 lumbar type vertebrae. Alignment: Normal. Vertebrae: There is an intermediate sized Schmorl's node at the superior L5 endplate. There is endplate sclerosis at K9-3 and L5-S1, worst at the inferior L4 endplate. No acute fracture. No lytic or blastic lesion. Paraspinal and other soft tissues: Negative. Disc levels: T11-T12: No disc herniation or stenosis.  Normal facets. T12-L1: No disc herniation or stenosis.  Normal facets. L1-L2: No disc herniation or stenosis.  Normal facets. L2-L3: Mild disc degeneration and endplate spurring. Normal facets. No spinal canal or neural foraminal stenosis. L3-L4: Minimal endplate spurring. No disc herniation or stenosis. Mild facet hypertrophy. L4-L5: Mild facet hypertrophy. Endplate remodeling with small disc bulge and minimal spurring. No spinal canal stenosis. Mild bilateral foraminal narrowing. L5-S1: Normal disc. Moderate facet hypertrophy. No spinal  canal stenosis. Mild bilateral foraminal narrowing. IMPRESSION: 1. No acute fracture or listhesis. 2. Mild bilateral foraminal stenosis at L4-5 and L5-S1, predominantly due to facet arthrosis. 3. L4-L5 and L5-S1 degenerative disc disease with endplate remodeling. 4. No spinal canal stenosis. Electronically Signed   By: Ulyses Jarred M.D.   On: 11/17/2017 17:50   Dg Chest Port 1 View  Result Date: 11/18/2017 CLINICAL DATA:  Hypothermia and altered mental status. Poor historian. Current smoker. EXAM: PORTABLE CHEST 1 VIEW COMPARISON:  11/15/2017 FINDINGS: Grossly unchanged cardiac silhouette and mediastinal contours given reduced lung volumes. Interval development potential small bilateral effusions with associated worsening bibasilar opacities, left greater than right. Mild pulmonary is congestion without frank evidence of edema. No pneumothorax. Ossicles are again noted superior to the left coracoid process. No acute osseus abnormalities. IMPRESSION: Suspected small bilateral effusions with worsening bibasilar opacities, left greater than right, atelectasis versus infiltrate. Further evaluation with a PA and lateral chest radiograph may be obtained as clinically indicated. Electronically Signed   By: Sandi Mariscal M.D.   On: 11/18/2017 08:58   Dg Abd 2 Views  Result Date: 11/15/2017 CLINICAL DATA:  Distention EXAM: ABDOMEN - 2 VIEW COMPARISON:  None. FINDINGS: Large stool burden throughout the colon. There is normal bowel gas pattern. No free air. No organomegaly or suspicious calcification. No acute bony abnormality. Visualized lung bases clear. IMPRESSION: Large stool burden.  No acute findings. Electronically Signed   By: Rolm Baptise M.D.   On: 11/15/2017 18:40   [place] Ekg Site Rite  Result Date: 11/18/2017 If Site Rite image not attached, placement could not be confirmed due to current cardiac rhythm.   Assessment and plan- Patient is a 57 y.o. male who has psychiatric disordered presents with altered  mental status.   # Thrombocytopenia, lymphopenia: Labs reviewed.  Patient has cytopenias upon admission.  No baseline labs in EMR to compare. Reviewed patient's medication both clozapine and Depakote can cause thrombocytopenia and leukopenia. It will be helpful if he has older lab records to compare to determine chronicity.  Monitor labs daily. Check smear, folate, hepatitis, HIV, TSH, haptoglobin, LDH.   # unintentional weight loss, check TSH. ? Underlying malignancy # AMS, etiology unknown. MRI brain was independently reviewed. BILATERAL 5 mm thick subdural hygromas, predominantly supratentorial. No blood products to suggest hematoma. These collections have developed since the previous CT scan of 11/06/2017, but their relation to the patient's current symptoms  is unclear.  Smear reviewed by me independantly, consistent with left shift, bandemia. ? Infection vs atelectasia. CT chest findings noted. Suggests starting IV antibiotics.    Thank you for allowing me to participate in the care of this patient.  Total face to face encounter time for this patient visit was 70 min. >50% of the time was  spent in counseling and coordination of care.    Earlie Server, MD, PhD Hematology Oncology [facility] Pager- [phone] 11/19/2017

## 2017-11-20 DIAGNOSIS — D61818 Other pancytopenia: Secondary | ICD-10-CM

## 2017-11-20 DIAGNOSIS — F71 Moderate intellectual disabilities: Secondary | ICD-10-CM

## 2017-11-20 DIAGNOSIS — R140 Abdominal distension (gaseous): Secondary | ICD-10-CM

## 2017-11-20 DIAGNOSIS — J690 Pneumonitis due to inhalation of food and vomit: Secondary | ICD-10-CM

## 2017-11-20 DIAGNOSIS — Z515 Encounter for palliative care: Secondary | ICD-10-CM

## 2017-11-20 DIAGNOSIS — I959 Hypotension, unspecified: Secondary | ICD-10-CM

## 2017-11-20 DIAGNOSIS — E43 Unspecified severe protein-calorie malnutrition: Secondary | ICD-10-CM

## 2017-11-20 DIAGNOSIS — Z7189 Other specified counseling: Secondary | ICD-10-CM

## 2017-11-20 LAB — CBC
HCT: 29 % — ABNORMAL LOW (ref 40.0–52.0)
Hemoglobin: 10.2 g/dL — ABNORMAL LOW (ref 13.0–18.0)
MCH: 32.9 pg (ref 26.0–34.0)
MCHC: 35.1 g/dL (ref 32.0–36.0)
MCV: 93.7 fL (ref 80.0–100.0)
PLATELETS: 38 10*3/uL — AB (ref 150–440)
RBC: 3.09 MIL/uL — AB (ref 4.40–5.90)
RDW: 15.2 % — ABNORMAL HIGH (ref 11.5–14.5)
WBC: 2.5 10*3/uL — AB (ref 3.8–10.6)

## 2017-11-20 LAB — DIFFERENTIAL
Basophils Absolute: 0 10*3/uL (ref 0–0.1)
Basophils Relative: 1 %
Eosinophils Absolute: 0 10*3/uL (ref 0–0.7)
Eosinophils Relative: 2 %
LYMPHS PCT: 13 %
Lymphs Abs: 0.3 10*3/uL — ABNORMAL LOW (ref 1.0–3.6)
Monocytes Absolute: 0.3 10*3/uL (ref 0.2–1.0)
Monocytes Relative: 11 %
NEUTROS ABS: 1.8 10*3/uL (ref 1.4–6.5)
NEUTROS PCT: 73 %

## 2017-11-20 LAB — MAGNESIUM: Magnesium: 1.9 mg/dL (ref 1.7–2.4)

## 2017-11-20 LAB — PATHOLOGIST SMEAR REVIEW

## 2017-11-20 LAB — BASIC METABOLIC PANEL
ANION GAP: 3 — AB (ref 5–15)
BUN: 30 mg/dL — ABNORMAL HIGH (ref 6–20)
CALCIUM: 9 mg/dL (ref 8.9–10.3)
CO2: 29 mmol/L (ref 22–32)
Chloride: 107 mmol/L (ref 98–111)
Creatinine, Ser: 0.98 mg/dL (ref 0.61–1.24)
GFR calc Af Amer: >60 mL/min (ref >60)
GFR calc non Af Amer: >60 mL/min (ref >60)
Glucose, Bld: 116 mg/dL — ABNORMAL HIGH (ref 70–99)
Potassium: 3.9 mmol/L (ref 3.5–5.1)
Sodium: 139 mmol/L (ref 135–145)

## 2017-11-20 LAB — VITAMIN B12: Vitamin B-12: 610 pg/mL (ref 180–914)

## 2017-11-20 LAB — PHOSPHORUS: Phosphorus: 3.1 mg/dL (ref 2.5–4.6)

## 2017-11-20 LAB — GLUCOSE, CAPILLARY
GLUCOSE-CAPILLARY: 134 mg/dL — AB (ref 70–99)
GLUCOSE-CAPILLARY: 118 mg/dL — AB (ref 70–99)
Glucose-Capillary: 125 mg/dL — ABNORMAL HIGH (ref 70–99)

## 2017-11-20 LAB — TRIGLYCERIDES: TRIGLYCERIDES: 62 mg/dL (ref <150)

## 2017-11-20 LAB — VITAMIN B1: VITAMIN B1 (THIAMINE): 86.6 nmol/L (ref 66.5–200.0)

## 2017-11-20 LAB — PREALBUMIN: Prealbumin: 8.6 mg/dL — ABNORMAL LOW (ref 18–38)

## 2017-11-20 MED ORDER — SODIUM CHLORIDE 0.9 % IV SOLN
100.0000 mg | Freq: Once | INTRAVENOUS | Status: AC
  Administered 2017-11-20: 100 mg via INTRAVENOUS
  Filled 2017-11-20 (×2): qty 2

## 2017-11-20 MED ORDER — SODIUM CHLORIDE 0.9 % IV SOLN
25.0000 mg | Freq: Once | INTRAVENOUS | Status: AC
  Administered 2017-11-20: 25 mg via INTRAVENOUS
  Filled 2017-11-20: qty 0.5

## 2017-11-20 MED ORDER — IPRATROPIUM-ALBUTEROL 0.5-2.5 (3) MG/3ML IN SOLN
3.0000 mL | RESPIRATORY_TRACT | Status: DC | PRN
  Administered 2017-11-20: 3 mL via RESPIRATORY_TRACT
  Filled 2017-11-20: qty 3

## 2017-11-20 MED ORDER — PIPERACILLIN-TAZOBACTAM 3.375 G IVPB
3.3750 g | Freq: Three times a day (TID) | INTRAVENOUS | Status: DC
  Administered 2017-11-20 – 2017-11-23 (×9): 3.375 g via INTRAVENOUS
  Filled 2017-11-20 (×10): qty 50

## 2017-11-20 MED ORDER — FAT EMULSION PLANT BASED 20 % IV EMUL
250.0000 mL | INTRAVENOUS | Status: AC
  Administered 2017-11-20: 250 mL via INTRAVENOUS
  Filled 2017-11-20: qty 250

## 2017-11-20 MED ORDER — TRACE MINERALS CR-CU-MN-SE-ZN 10-1000-500-60 MCG/ML IV SOLN
INTRAVENOUS | Status: AC
  Administered 2017-11-20: 19:00:00 via INTRAVENOUS
  Filled 2017-11-20: qty 1992

## 2017-11-20 MED ORDER — GUAIFENESIN ER 600 MG PO TB12
600.0000 mg | ORAL_TABLET | Freq: Two times a day (BID) | ORAL | Status: DC
  Administered 2017-11-20 – 2017-11-24 (×2): 600 mg via ORAL
  Filled 2017-11-20 (×2): qty 1

## 2017-11-20 MED ORDER — NEPRO/CARBSTEADY PO LIQD
237.0000 mL | Freq: Two times a day (BID) | ORAL | Status: DC
  Administered 2017-11-20: 237 mL via ORAL

## 2017-11-20 MED ORDER — SODIUM CHLORIDE 0.9 % IV SOLN: 25.0000 mg | Freq: Once | INTRAVENOUS | Status: DC

## 2017-11-20 MED ORDER — PIPERACILLIN-TAZOBACTAM 3.375 G IVPB 30 MIN: 3.3750 g | Freq: Four times a day (QID) | INTRAVENOUS | Status: DC

## 2017-11-20 NOTE — Consult Note (Signed)
Subjective: Patient is awake and alert. He is able to talk but still is not verbose. He is hypothermic and has warming blanket on. No significant events reported by nursing staff overnight. He denies any issues today.  States that he may try and eat today.  Objective: Current vital signs: BP 118/87 (BP Location: Right Arm)   Pulse 70   Temp (!) 97.2 F (36.2 C) (Rectal)   Resp (!) 26   Ht 6\' 1"  (1.854 m)   Wt 75.2 kg   SpO2 98%   BMI 21.87 kg/m  Vital signs in last 24 hours: Temp:  [94.8 F (34.9 C)-97.3 F (36.3 C)] 97.2 F (36.2 C) (09/30 0930) Pulse Rate:  [70-87] 70 (09/30 0348) Resp:  [18-26] 26 (09/30 0348) BP: (111-122)/(74-87) 118/87 (09/30 0348) SpO2:  [96 %-98 %] 98 % (09/30 0348) Weight:  [75.2 kg] 75.2 kg (09/30 0500)  Intake/Output from previous day: 09/29 0701 - 09/30 0700 In: 295.9 [I.V.:255.1; IV Piggyback:40.8] Out: 1400 [Urine:1400] Intake/Output this shift: No intake/output data recorded. Nutritional status:  Diet Order            DIET - DYS 1 Room service appropriate? Yes with Assist; Fluid consistency: Nectar Thick  Diet effective now             Neurologic Exam: Mental Status: Alert and awake. Minimal verbal responds much better than previous.  Follows simple commands.  . Cranial Nerves: II: Discs flat bilaterally; Blinks to bilateral confrontation, pupils equal, round, reactive to light and accommodation III,IV, VI: ptosis not present, extra-ocular motions intact bilaterally V,VII: smile symmetric, facial light touch sensation normal bilaterally VIII: hearing normal bilaterally IX,X: gag reflex present XI: bilateral shoulder shrug XII: midline tongue extension Motor: Moves upper extremities spontaneously.  Withdraws bilateral lower extremities with little effort. Sensory: Responds to noxious stimuli in all extremities  Lab Results: Basic Metabolic Panel: Recent Labs  Lab 11/17/17 0436 11/17/17 1059 11/18/17 0709 11/19/17 0432  11/20/17 0547  NA 147* 146* 146* 144 139  K 4.4 4.1 4.6 4.3 3.9  CL 115* 114* 115* 112* 107  CO2 27 27 28 28 29   GLUCOSE 67* 154* 84 118* 116*  BUN 29* 27* 24* 25* 30*  CREATININE 1.09 1.09 1.08 1.17 0.98  CALCIUM 8.9 8.9 9.3 9.0 9.0  MG  --   --  1.9 1.6* 1.9  PHOS  --   --  2.8 3.3 3.1    Liver Function Tests: Recent Labs  Lab 11/17/17 1059 11/18/17 0709 11/19/17 0432  AST 39 33 36  ALT 24 24 29   ALKPHOS 62 61 65  BILITOT 0.5 0.6 0.6  PROT 5.5* 5.2* 5.2*  ALBUMIN 2.6* 2.5* 2.4*   No results for input(s): LIPASE, AMYLASE in the last 168 hours. Recent Labs  Lab 11/17/17 1059  AMMONIA 24    CBC: Recent Labs  Lab 11/16/17 0414 11/16/17 1031 11/17/17 0436 11/18/17 0709 11/19/17 0432 11/20/17 3329  WBC 5.8 DUPLICATE REQUEST 3.1* 2.6* 3.3* 2.5*  NEUTROABS 5.2 PENDING  --  2.2 3.0 1.8  HGB 51.8* DUPLICATE REQUEST 84.1* 11.0* 11.0* 66.0*  HCT 63.0* DUPLICATE REQUEST 16.0* 31.8* 31.6* 10.9*  MCV 32.3 DUPLICATE REQUEST 55.7 32.2 94.1 02.5  PLT 76* DUPLICATE REQUEST 70* 63* 47* 38*    Cardiac Enzymes: Recent Labs  Lab 11/15/17 1622  TROPONINI <0.03    Lipid Panel: Recent Labs  Lab 11/19/17 0432 11/20/17 0547  TRIG 79 62    CBG: Recent Labs  Lab 11/19/17 [id number]  11/19/17 1135 11/19/17 1856 11/19/17 2329 11/20/17 0538  GLUCAP 88 73 85 95 125*    Microbiology: Results for orders placed or performed during the hospital encounter of 11/15/17  CULTURE, BLOOD (ROUTINE X 2) w Reflex to ID Panel     Status: None (Preliminary result)   Collection Time: 11/18/17  7:15 AM  Result Value Ref Range Status   Specimen Description BLOOD LEFT AC  Final   Special Requests   Final    BOTTLES DRAWN AEROBIC AND ANAEROBIC Blood Culture adequate volume   Culture   Final    NO GROWTH < 24 HOURS Performed at [facility], [address] [place], [place] [address]    Report Status PENDING  Incomplete  CULTURE, BLOOD (ROUTINE X 2) w Reflex to ID Panel      Status: None (Preliminary result)   Collection Time: 11/18/17  7:21 AM  Result Value Ref Range Status   Specimen Description BLOOD LEFT WRIST  Final   Special Requests   Final    BOTTLES DRAWN AEROBIC AND ANAEROBIC Blood Culture adequate volume   Culture   Final    NO GROWTH < 24 HOURS Performed at [facility], [address], [place], [place] [address]    Report Status PENDING  Incomplete    Coagulation Studies: Recent Labs    11/18/17 1228  LABPROT 12.6  INR 0.95    Imaging: Dg Chest 2 View  Result Date: 11/18/2017 CLINICAL DATA:  Acute toxic metabolic encephalopathy. EXAM: CHEST - 2 VIEW COMPARISON:  Chest film earlier in the day. FINDINGS: Low lung volumes accentuate the cardiac silhouette which is probably within normal limits, given the degree of inspiration. BILATERAL effusions are increased, particularly on the RIGHT, with associated increasing bibasilar opacities which could represent atelectasis or infiltrate. Tortuous aorta. No osseous findings of significance. IMPRESSION: BILATERAL RIGHT greater than LEFT pleural effusions. Bibasilar opacities which could represent atelectasis or pneumonia. Worsening aeration from priors. Electronically Signed   By: Staci Righter M.D.   On: 11/18/2017 13:59   Dg Abd 1 View  Result Date: 11/18/2017 CLINICAL DATA:  Acute toxic metabolic encephalopathy. Abdominal pain. EXAM: ABDOMEN - 1 VIEW COMPARISON:  CT lumbar spine 11/17/2016, abdomen radiograph 11/15/2017. FINDINGS: There is extreme constipation, distended colon filled with stool, from the RIGHT colon to the rectum. This is confirmed on prior CT lumbar. Mildly prominent small bowel loops could represent ileus or early obstruction. Prominent stomach. Lower lumbar facet arthropathy. No visible calcifications. IMPRESSION: Extreme constipation. Mildly prominent small bowel loops could represent ileus or developing early obstruction. Electronically Signed   By: Staci Righter M.D.    On: 11/18/2017 13:57   Ct Chest Wo Contrast  Result Date: 11/19/2017 CLINICAL DATA:  Lethargic, abnormal chest x-ray.  Pancytopenia. EXAM: CT CHEST WITHOUT CONTRAST TECHNIQUE: Multidetector CT imaging of the chest was performed following the standard protocol without IV contrast. COMPARISON:  None. FINDINGS: Cardiovascular: No significant vascular findings. Normal heart size. No pericardial effusion. Thoracic aortic atherosclerosis. Mediastinum/Nodes: No enlarged mediastinal or axillary lymph nodes. Thyroid gland, trachea, and esophagus demonstrate no significant findings. Lungs/Pleura: Bilateral small pleural effusions. Bilateral lower lobe airspace disease likely reflecting compressive atelectasis and/or pneumonia. Patchy areas of airspace disease in bilateral upper lobes and right middle lobe concerning for multilobar pneumonia. No pneumothorax. Upper Abdomen: No acute abnormality. Musculoskeletal: No acute osseous abnormality. No aggressive osseous lesion. IMPRESSION: 1. Findings concerning for multilobar pneumonia. Small bilateral pleural effusions. Electronically Signed   By: Elbert Ewings  Posey Pronto   On: 11/19/2017 13:47   Mr Brain Wo Contrast  Result Date: 11/19/2017 CLINICAL DATA:  Altered mental status, unclear cause, nonambulatory. Ataxia with head trauma was given as the history on 11/06/17. EXAM: MRI HEAD WITHOUT CONTRAST TECHNIQUE: Multiplanar, multiecho pulse sequences of the brain and surrounding structures were obtained without intravenous contrast. COMPARISON:  Prior CT head 11/06/2017.  Also CT head 11/17/2017. FINDINGS: Brain: No acute stroke, acute hemorrhage, mass lesion, or hydrocephalus. Normal for age cerebral volume. No significant white matter disease. No parenchymal hemorrhage. BILATERAL extra-axial cerebral and cerebellar CSF fluid collections are seen, primarily over the bifrontal convexity, reflecting subdural hygromas. These measure 5 mm thick on the RIGHT and LEFT over the frontal  lobes. No blood products to suggest hematoma. The hygromas have developed since the initial CT of 11/06/2017, and are similar in appearance to 11/17/2017 CT. Vascular: Normal flow voids. Skull and upper cervical spine: No visible skull fracture. Incidental arachnoid granulation projects into the LEFT paramedian frontal bone. Sinuses/Orbits: Unremarkable. Other: None.  These measure IMPRESSION: BILATERAL 5 mm thick subdural hygromas, predominantly supratentorial. No blood products to suggest hematoma. These collections have developed since the previous CT scan of 11/06/2017, but their relation to the patient's current symptoms is unclear. A text page was placed to the ordering provider at 1:15 p.m. Electronically Signed   By: Staci Righter M.D.   On: 11/19/2017 13:16   US Abdomen Complete  Result Date: 11/19/2017 CLINICAL DATA:  Pancytopenia EXAM: ABDOMEN ULTRASOUND COMPLETE COMPARISON:  None. FINDINGS: Gallbladder: No gallstones or wall thickening visualized. No sonographic Murphy sign noted by sonographer. Common bile duct: Diameter: Normal at 5 mm Liver: Normal liver parenchyma. Portal vein is patent on color Doppler imaging with normal direction of blood flow towards the liver. IVC: No abnormality visualized. Pancreas: Visualized portion unremarkable. Spleen: Size and appearance within normal limits. Right Kidney: Length: 9.9 cm.  There is Left Kidney: Length: 9.2 cm. Echogenicity within normal limits. No mass or hydronephrosis visualized. Abdominal aorta: No aneurysm visualized. Other findings: Large amount of bowel gas and stool limits exam. Small amount of ascites. IMPRESSION: 1. No acute abdominal findings. 2. Small volume ascites. 3. Limitations due to bowel gas and stool. Electronically Signed   By: Suzy Bouchard M.D.   On: 11/19/2017 15:42   [place] Ekg Site Rite  Result Date: 11/18/2017 If Site Rite image not attached, placement could not be confirmed due to current cardiac rhythm.   Medications:   I have reviewed the patient's current medications. Prior to Admission:  Medications Prior to Admission  Medication Sig Dispense Refill Last Dose  . Cholecalciferol (VITAMIN D-3) 5000 units TABS Take 1 tablet by mouth daily.     . cloZAPine (CLOZARIL) 100 MG tablet Take 1 tablet (100 mg total) by mouth 2 (two) times daily. 60 tablet 0   . cloZAPine (CLOZARIL) 100 MG tablet Take 3 tablets (300 mg total) by mouth at bedtime. 90 tablet 0   . desmopressin (DDAVP) 0.2 MG tablet Take 0.2 mg by mouth at bedtime.     . divalproex (DEPAKOTE ER) 500 MG 24 hr tablet Take 3 tablets (1,500 mg total) by mouth at bedtime. 90 tablet 0   . docusate sodium (COLACE) 100 MG capsule Take 1 capsule (100 mg total) by mouth 2 (two) times daily. 60 capsule 0   . hydrALAZINE (APRESOLINE) 25 MG tablet Take 25 mg by mouth at bedtime.     . metoprolol tartrate (LOPRESSOR) 25 MG tablet Take 0.5  tablets (12.5 mg total) by mouth 2 (two) times daily. 60 tablet 0   . ibuprofen (ADVIL,MOTRIN) 600 MG tablet Take 1 tablet (600 mg total) by mouth every 8 (eight) hours as needed. 30 tablet 0  at Unknown time  . polyethylene glycol (MIRALAX / GLYCOLAX) packet Take 17 g by mouth every other day. (Patient not taking: Reported on 11/16/2017) 15 each 0 Not Taking at Unknown time   Scheduled: . cholecalciferol  5,000 Units Oral Daily  . docusate sodium  100 mg Oral BID  . feeding supplement (NEPRO CARB STEADY)  237 mL Oral BID BM  . insulin aspart  0-9 Units Subcutaneous Q6H  . midodrine  5 mg Oral TID WC  . nicotine  14 mg Transdermal Daily  . polyethylene glycol  17 g Oral QODAY  . sodium chloride flush  10-40 mL Intracatheter Q12H    Patient seen and examined.  Clinical course and management discussed.  Necessary edits performed.  I agree with the above.  Assessment and plan of care developed and discussed below.     Assessment/Plan: Patient slowly improving. MRI brain reviewed and shows development of bilateral  subdural  hygromas, predominantly supratentorial.  Unclear significance.  Patient also with underlying pneumonia which may also be contributing to mental status.  Antibiotics initiated.       Recommendations: 1. No intervention for hygromas at this time.  Will re-image if patient does not continue to improve with antibiotics.     LOS: 5 days   11/20/2017  11:07 AM  Alexis Goodell, MD Neurology [phone]  11/20/2017  11:35 AM

## 2017-11-20 NOTE — Progress Notes (Signed)
PHARMACY - ADULT TOTAL PARENTERAL NUTRITION CONSULT NOTE   Pharmacy Consult for TPN Indication: malnutrition  Patient Measurements: Height: 6\' 1"  (185.4 cm) Weight: 165 lb 12.6 oz (75.2 kg) IBW/kg (Calculated) : 79.9 TPN AdjBW (KG): 65.8 Body mass index is 21.87 kg/m.  Assessment: Austin Gates cc leg pain. Patient's family is concerned for swallowing problems and malnutrition.  GI: Dysphagia and refusing to eat, speech to see Endo: Patient has a documented history of diabetes but doesn't have any anti-hyperglycemics recorded on his medication list. He has several antipsychotic agents that can affect blood glucose and insulin sensitivity. Insulin requirements in the past 24 hours:  Lytes: Mg: 1.9, Phos: 3.1, K: 3.9  Will follow for possibility of refeeding per TPN protocol Renal: SCr 1.17 mg/dL, CrCl 75 mL/min Pulm:  Cards:  Hepatobil: Neuro: Patient is alert but not talking or eating. EEG with slowing, MRI pending. Neurology consulted and suspects some degree of metabolic encephalopathy due to psychiatric medications - psych is following. Patient has a history of schizoaffective disorder and bipolar disorder for which he takes clozapine and Depakote. ID:  TPN Access: PICC placement 9/28 TPN start date:  11/18/17 Nutritional Goals (per RD recommendation on 11/19/17): 2233 kcal, 100 grams of protein, 2232 mL fluid daily including lipids.  Current Nutrition: TPN  Plan:  Continue goal TPN regimen of Clinimix E 5/20 at 83 mL/hr + 20% ILE at 20 mL/hr over 12 hours.  Add MVI, trace elements, and thiamine 100mg  daily x 3 days (9/29-10/1)  Sensitive SSI ordered., adjust as needed  Monitor TPN labs, per protocol  Plan is to d/c TPN after this bag if patient tolerates diet.   Dallie Piles, Pharm.D Clinical Pharmacist 11/20/2017,1:10 PM

## 2017-11-20 NOTE — Evaluation (Signed)
Clinical/Bedside Swallow Evaluation Patient Details  Name: Austin Gates MRN: [id number] Date of Birth: March 03, 1960  Today's Date: 11/20/2017 Time: SLP Start Time (ACUTE ONLY): 0902 SLP Stop Time (ACUTE ONLY): 1000 SLP Time Calculation (min) (ACUTE ONLY): 58 min  Past Medical History:  Past Medical History:  Diagnosis Date  . Diabetes mellitus without complication ([place])   . Hypertension    Past Surgical History:  Past Surgical History:  Procedure Laterality Date  . KNEE ARTHROSCOPY     HPI:  Pt is a 57 y.o. male with a known history of Schizoaffective disorder, bipolar disorder, mental retardation, tobacco abuse(ongoing now). Per chart notes, pt is Malnurished. Patient lives in a skilled setting, Group Home. Patient was sent to emergency room for increased drowsiness, confusion, generalized weakness and low blood pressure; dehydration.  Most of his symptoms have been going on for the past 5 to 7 days, gradually getting worse.  No reports of fever, chills, nausea, vomiting, diarrhea, bleeding.  Apparently, per ALF report, patient has been losing weight in the past few months, despite the fact that he still has a good appetite.  MD felt there could be an impact of too many medications.  Pt was initially started on TPN for nutritional support but has been NPO. MRI revelaed BILATERAL 5 mm thick subdural hygromas developed since the previous CT scan of 11/06/2017.    Assessment / Plan / Recommendation Clinical Impression  Pt appeared to present w/ adequate oropharyngeal phase swallow function w/ modified consistency of po trials during this assessment. Pt required verbal/tactile cues but was able to follow along w/ po tasks adequately to take po's w/ SLP feeding him. Pt appeared to adequate swallow po consistencies w/ no immediate, overt s/s of aspiration noted; no decline in respiratory status or vocal quality post trials. Oral phase was grossly wfl for the bolus consistencies given. He opened  mouth and accepted 1/2 amounts and small sips of the Nectar liquids and purees. Oral clearing noted b/t trials. OM presentation revealed no unilateral asymmetry or weakness. Pt was minimally verbal and did not follow commands; did not demontrate a volitional cough upon request. Due to pt's overall presentation and Cognitive decline, no further trials given during this assessment. Recommend an initial trial diet of Nectar consistency liquids and Puree(Dysphagia level 1) w/ strict aspiration precautions and feeding monitoring; Pills in Puree - Crushed as able. ST will f/u for toleration of diet and trials to upgrade if appropriate.  SLP Visit Diagnosis: Dysphagia, oropharyngeal phase (R13.12)(Cognitive decline)    Aspiration Risk  Mild-Mod. aspiration risk;Risk for inadequate nutrition/hydration    Diet Recommendation  Dysphagia level 1 w/ Nectar liquids; strict aspiration precautions; feeding support and monitoring w/ po's  Medication Administration: Crushed with puree(for safer swallowing)    Other  Recommendations Recommended Consults: (Dietician following) Oral Care Recommendations: Oral care BID;Staff/trained caregiver to provide oral care Other Recommendations: Order thickener from pharmacy;Prohibited food (jello, ice cream, thin soups);Remove water pitcher;Have oral suction available   Follow up Recommendations Skilled Nursing facility(TBD)      Frequency and Duration min 3x week  2 weeks       Prognosis Prognosis for Safe Diet Advancement: Fair Barriers to Reach Goals: Cognitive deficits;Severity of deficits(baseline status)      Swallow Study   General Date of Onset: 11/15/17 HPI: Pt is a 57 y.o. male with a known history of Schizoaffective disorder, bipolar disorder, mental retardation, tobacco abuse(ongoing now). Per chart notes, pt is Malnurished. Patient lives in a skilled setting,  Group Home. Patient was sent to emergency room for increased drowsiness, confusion, generalized  weakness and low blood pressure; dehydration.  Most of his symptoms have been going on for the past 5 to 7 days, gradually getting worse.  No reports of fever, chills, nausea, vomiting, diarrhea, bleeding.  Apparently, per ALF report, patient has been losing weight in the past few months, despite the fact that he still has a good appetite.  MD felt there could be an impact of too many medications.  Pt was initially started on TPN for nutritional support but has been NPO. MRI revelaed BILATERAL 5 mm thick subdural hygromas developed since the previous CT scan of 11/06/2017.  Type of Study: Bedside Swallow Evaluation Previous Swallow Assessment: unknown Diet Prior to this Study: NPO(w/ TPN) Temperature Spikes Noted: No(wbc 2.5) Respiratory Status: Nasal cannula(2 liters) History of Recent Intubation: No Behavior/Cognition: Cooperative;Pleasant mood;Confused;Requires cueing;Doesn't follow directions(baseline MR) Oral Cavity Assessment: (difficult to assess d/t Cognitive assessmet; dry anteriorly) Oral Care Completed by SLP: Yes(attempted) Oral Cavity - Dentition: Missing dentition Vision: (n/a) Self-Feeding Abilities: Total assist(no attempt noted) Patient Positioning: Upright in bed Baseline Vocal Quality: Low vocal intensity Volitional Cough: Cognitively unable to elicit Volitional Swallow: Unable to elicit    Oral/Motor/Sensory Function Overall Oral Motor/Sensory Function: Within functional limits(appeared grossly WFL w/ bolus management)   Ice Chips Ice chips: Not tested   Thin Liquid Thin Liquid: Not tested    Nectar Thick Nectar Thick Liquid: Within functional limits Presentation: Spoon;Straw(3 - tsp; ~3 ozs by straw) Other Comments: pt required verbal/tactile cues but followed along w/ po tasks adequately to take po's   Honey Thick Honey Thick Liquid: Not tested   Puree Puree: Within functional limits Presentation: Spoon(fed; 10 trials) Other Comments: pt required verbal/tactile cues  but followed along w/ po tasks adequately to take po's   Solid     Solid: Not tested       Orinda Kenner, MS, CCC-SLP Leafy Motsinger 11/20/2017,3:31 PM

## 2017-11-20 NOTE — Progress Notes (Signed)
Physical Therapy Treatment Patient Details Name: Austin Gates MRN: [id number] DOB: 06-Jul-1960 Today's Date: 11/20/2017    History of Present Illness Pt is a 57 y.o. male with a known history of tobacco abuse, psychiatric disorder and mental retardation.  Patient lives in an assisted living. He is not able to provide reliable history due to his mental status. Patient was sent to emergency room for increased drowsiness, confusion, generalized weakness and low blood pressure.  Most of his symptoms have been going on for the past 5 to 7 days, gradually getting worse.  No reports of fever, chills, nausea, vomiting, diarrhea, bleeding.  In the emergency room, patient was noted with systolic blood pressure in 70s.  This finally improved after 4 L of fluids to 80s.  Blood test done emergency room were notable for sodium level of 148, creatinine level 1.57 and BUN  44.  UA was negative for UTI and chest x-ray was negative for acute cardiopulmonary disease.  Abdominal x-ray shows large stool burden. Patient is admitted for further evaluation and treatment.  Assessment includes: Acute renal failure, likely prerenal, related to dehydration and possible side effects from medications, Acute encephalopathy, hypotension, and constipation.     PT Comments    Pt is pleasant and agreeable to PT. Pt performed supine ther-ex in with Mod A. Requires heavy tactile and VC to follow single step commands, easily distracted. Pt performs bed mobility with Max A, +2 physical. Once seated EOB pt can maintain balance >30 sec. Tolerates min weight shift without loss of balance. D/t great weakness in the LE unsafe to progress mobility more this date. Will progress strength and mobility as able.  Follow Up Recommendations  SNF;Supervision for mobility/OOB     Equipment Recommendations  Rolling walker with 5" wheels;Other (comment)    Recommendations for Other Services       Precautions / Restrictions  Precautions Precautions: Fall Restrictions Weight Bearing Restrictions: No    Mobility  Bed Mobility Overal bed mobility: Needs Assistance Bed Mobility: Supine to Sit;Sit to Sidelying     Supine to sit: Max assist;+2 for physical assistance   Sit to sidelying: Mod assist;+2 for physical assistance General bed mobility comments: Pt does not have good control of his trunk, UE, or LE. Had difficulty maintaining grip of bed handle with R hand and could not contribute to push into bed with UE. Pt able to contribute to lower into sidelying, still has diffculties managing extermities.  Transfers                 General transfer comment: Unsafe to stand at this time, pt has dec LE strength and trouble with trunk control.  Ambulation/Gait                 Stairs             Wheelchair Mobility    Modified Rankin (Stroke Patients Only)       Balance Overall balance assessment: Needs assistance Sitting-balance support: Bilateral upper extremity supported;Feet supported Sitting balance-Leahy Scale: Poor Sitting balance - Comments: Initally pt required max A at trunk to maintain upright posture inconsistant lean present, right, left and anterior present at times. With practice pt able to maintain posturing on his own for >30 secs.                                    Cognition Arousal/Alertness: Awake/alert Behavior During Therapy:  Flat affect Overall Cognitive Status: History of cognitive impairments - at baseline                                 General Comments: Pt follows commands a little inconsistantly.      Exercises Other Exercises Other Exercises: Supine BLE ankle pumps, quad sets, SAQs, SLRs, and hip ABD/ADD. All ther-ex x10 reps with Mod A and heavy tactile and VC. Other Exercises: Seated BLE LAQs x10 reps with heavy tactile and VC for sequencing. Other Exercises: Seated BUE reaching both contralateral and ispilateral. Pt  demonstrates ability to shift weight without LOB. Pt fatigues quickly.    General Comments        Pertinent Vitals/Pain Pain Assessment: No/denies pain    Home Living                      Prior Function            PT Goals (current goals can now be found in the care plan section) Acute Rehab PT Goals Patient Stated Goal: To get stronger and walk better with improved balance prior to return to ALF PT Goal Formulation: With family Time For Goal Achievement: 11/29/17 Potential to Achieve Goals: Fair Progress towards PT goals: Progressing toward goals    Frequency    Min 2X/week      PT Plan Current plan remains appropriate    Co-evaluation              AM-PAC PT "6 Clicks" Daily Activity  Outcome Measure  Difficulty turning over in bed (including adjusting bedclothes, sheets and blankets)?: Unable Difficulty moving from lying on back to sitting on the side of the bed? : Unable Difficulty sitting down on and standing up from a chair with arms (e.g., wheelchair, bedside commode, etc,.)?: Unable Help needed moving to and from a bed to chair (including a wheelchair)?: A Lot Help needed walking in hospital room?: Total Help needed climbing 3-5 steps with a railing? : Total 6 Click Score: 7    End of Session   Activity Tolerance: Patient limited by fatigue Patient left: in bed;with call bell/phone within reach;with bed alarm set;with family/visitor present Nurse Communication: Mobility status PT Visit Diagnosis: Unsteadiness on feet (R26.81);Muscle weakness (generalized) (M62.81);Difficulty in walking, not elsewhere classified (R26.2);Repeated falls (R29.6)     Time: 2035-5974 PT Time Calculation (min) (ACUTE ONLY): 27 min  Charges:                        Algis Downs, SPT    Algis Downs 11/20/2017, 11:40 AM

## 2017-11-20 NOTE — Progress Notes (Signed)
Talked to SLP regarding auscultation of coarse crackles on lung fields. Asked if aspiration from lunch is probable. SLP will reassess in am and advised to put in NPO if starting to decompensate.  ---- Paged Dr Jerelyn Charles regarding assessment, although VSS. Ordered Duonebs q4h PRN for wheezing/SOB. No further orders.

## 2017-11-20 NOTE — Progress Notes (Signed)
Hematology/Oncology Progress Note [facility] Telephone:([phone] [phone]  Patient Care Team: Raelyn Number, MD as PCP - General (Internal Medicine)   Name of the patient: Austin Gates  [id number]  [birth year]  Date of visit: 11/20/17   INTERVAL HISTORY-  No acute overnight events.  Patient's mental status is much better today. Sister at bedside.   Review of systems- Review of Systems  Unable to perform ROS: Mental status change  Respiratory: Negative for sputum production.   Cardiovascular: Negative for chest pain.  Gastrointestinal: Negative for abdominal pain, nausea and vomiting.  Neurological: Negative for tingling and tremors.  Psychiatric/Behavioral: Negative for hallucinations.    No Known Allergies  Patient Active Problem List   Diagnosis Date Noted  . Protein-calorie malnutrition, severe 11/20/2017  . Hypotension   . Pancytopenia ([place])   . ARF (acute renal failure) ([place]) 11/15/2017  . Elevated lithium level 11/06/2017  . Fall 11/06/2017  . Schizoaffective disorder, bipolar type ([place]) 10/04/2016  . Tobacco use disorder 09/30/2016  . 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 Diabetes mellitus without complication ([place])   . Hypertension      Past Surgical History:  Procedure Laterality Date  . KNEE ARTHROSCOPY      Social History   Socioeconomic History  . Marital status: Single    Spouse name: Not on file  . Number of children: Not on file  . Years of education: Not on file  . Highest education level: Not on file  Occupational History  . Not on file  Social Needs  . Financial resource strain: Not on file  . Food insecurity:    Worry: Not on file    Inability: Not on file  . Transportation needs:    Medical: Not on file    Non-medical: Not on file  Tobacco Use  . Smoking status: Current Every Day Smoker    Packs/day: 1.00    Types: Cigarettes    . Smokeless tobacco: Never Used  Substance and Sexual Activity  . Alcohol use: No  . Drug use: No  . Sexual activity: Never  Lifestyle  . Physical activity:    Days per week: Not on file    Minutes per session: Not on file  . Stress: Not on file  Relationships  . Social connections:    Talks on phone: Not on file    Gets together: Not on file    Attends religious service: Not on file    Active member of club or organization: Not on file    Attends meetings of clubs or organizations: Not on file    Relationship status: Not on file  . Intimate partner violence:    Fear of current or ex partner: Not on file    Emotionally abused: Not on file    Physically abused: Not on file    Forced sexual activity: Not on file  Other Topics Concern  . Not on file  Social History Narrative  . Not on file     Family History  Problem Relation Age of Onset  . Diabetes Mother      Current Facility-Administered Medications:  .  acetaminophen (TYLENOL) tablet 650 mg, 650 mg, Oral, Q6H PRN **OR** acetaminophen (TYLENOL) suppository 650 mg, 650 mg, Rectal, Q6H PRN, Amelia Jo, MD .  bisacodyl (DULCOLAX) EC tablet 5 mg, 5 mg, Oral, Daily PRN, Amelia Jo, MD, 5 mg at 11/17/17 0954 .  cholecalciferol (VITAMIN D) tablet 5,000 Units, 5,000 Units, Oral, Daily, Amelia Jo, MD, 5,000 Units at 11/20/17 1105 .  docusate sodium (COLACE) capsule 100 mg, 100 mg, Oral, BID, Amelia Jo, MD, 100 mg at 11/17/17 0751 .  TPN (CLINIMIX-E) Adult, , Intravenous, Continuous TPN **AND** Fat emulsion 20 % infusion 250 mL, 250 mL, Intravenous, Continuous TPN, Salary, Montell D, MD .  feeding supplement (NEPRO CARB STEADY) liquid 237 mL, 237 mL, Oral, BID BM, Salary, Montell D, MD, 237 mL at 11/20/17 1117 .  guaiFENesin (MUCINEX) 12 hr tablet 600 mg, 600 mg, Oral, BID, Salary, Montell D, MD, 600 mg at 11/20/17 1346 .  haloperidol lactate (HALDOL) injection 2 mg, 2 mg, Intramuscular, Q8H PRN, Salary, Montell D,  MD .  insulin aspart (novoLOG) injection 0-9 Units, 0-9 Units, Subcutaneous, Q6H, Salary, Montell D, MD, 1 Units at 11/20/17 1346 .  midodrine (PROAMATINE) tablet 5 mg, 5 mg, Oral, TID WC, Dustin Flock, MD, 5 mg at 11/20/17 1106 .  nicotine (NICODERM CQ - dosed in mg/24 hours) patch 14 mg, 14 mg, Transdermal, Daily, Salary, Montell D, MD .  ondansetron (ZOFRAN) tablet 4 mg, 4 mg, Oral, Q6H PRN **OR** ondansetron (ZOFRAN) injection 4 mg, 4 mg, Intravenous, Q6H PRN, Amelia Jo, MD .  phenol (CHLORASEPTIC) mouth spray 2 spray, 2 spray, Mouth/Throat, PRN, Epifanio Lesches, MD .  piperacillin-tazobactam (ZOSYN) IVPB 3.375 g, 3.375 g, Intravenous, Q8H, Salary, Montell D, MD, Last Rate: 12.5 mL/hr at 11/20/17 1049, 3.375 g at 11/20/17 1049 .  polyethylene glycol (MIRALAX / GLYCOLAX) packet 17 g, 17 g, Oral, Darl Householder, MD, 17 g at 11/17/17 0751 .  sodium chloride flush (NS) 0.9 % injection 10-40 mL, 10-40 mL, Intracatheter, Q12H, Alexis Goodell, MD, 20 mL at 11/20/17 1029 .  sodium chloride flush (NS) 0.9 % injection 10-40 mL, 10-40 mL, Intracatheter, PRN, Alexis Goodell, MD .  TPN (CLINIMIX-E) Adult, , Intravenous, Continuous TPN, Last Rate: 83 mL/hr at 11/19/17 1855 **AND** [EXPIRED] Fat emulsion 20 % infusion 250 mL, 250 mL, Intravenous, Continuous TPN, Hallaji, Sheema M, RPH, Last Rate: 20 mL/hr at 11/19/17 1858, 250 mL at 11/19/17 1858   Physical exam:  Vitals:   11/20/17 0930 11/20/17 1157 11/20/17 1456 11/20/17 1457  BP:  115/81 114/77   Pulse:  78 77 72  Resp:  19    Temp: (!) 97.2 F (36.2 C) 97.7 F (36.5 C)  98 F (36.7 C)  TempSrc: Rectal Axillary  Axillary  SpO2:  98% 94% 100%  Weight:      Height:       Physical Exam  Constitutional: No distress.  HENT:  Head: Normocephalic and atraumatic.  Eyes: No scleral icterus.  Neck: Neck supple.  Cardiovascular: Normal rate.  No murmur heard. Pulmonary/Chest: Effort normal. No respiratory distress.   diminished breath sound bilaterally.   Abdominal: Soft. Bowel sounds are normal. He exhibits distension.  Musculoskeletal: Normal range of motion.  Neurological:  Lathargic, not answering any questions or following command   CMP Latest Ref Rng & Units 11/20/2017  Glucose 70 - 99 mg/dL 116(H)  BUN 6 - 20 mg/dL 30(H)  Creatinine 0.61 - 1.24 mg/dL 0.98  Sodium 135 - 145 mmol/L 139  Potassium 3.5 - 5.1 mmol/L 3.9  Chloride 98 - 111 mmol/L 107  CO2 22 - 32 mmol/L 29  Calcium 8.9 - 10.3 mg/dL 9.0  Total Protein 6.5 - 8.1 g/dL -  Total Bilirubin 0.3 - 1.2 mg/dL -  Alkaline Phos 38 - 126  U/L -  AST 15 - 41 U/L -  ALT 0 - 44 U/L -   CBC Latest Ref Rng & Units 11/20/2017  WBC 3.8 - 10.6 K/uL 2.5(L)  Hemoglobin 13.0 - 18.0 g/dL 10.2(L)  Hematocrit 40.0 - 52.0 % 29.0(L)  Platelets 150 - 440 K/uL 38(L)   RADIOGRAPHIC STUDIES: I have personally reviewed the radiological images as listed and agreed with the findings in the report.  Dg Chest 2 View  Result Date: 11/18/2017 CLINICAL DATA:  Acute toxic metabolic encephalopathy. EXAM: CHEST - 2 VIEW COMPARISON:  Chest film earlier in the day. FINDINGS: Low lung volumes accentuate the cardiac silhouette which is probably within normal limits, given the degree of inspiration. BILATERAL effusions are increased, particularly on the RIGHT, with associated increasing bibasilar opacities which could represent atelectasis or infiltrate. Tortuous aorta. No osseous findings of significance. IMPRESSION: BILATERAL RIGHT greater than LEFT pleural effusions. Bibasilar opacities which could represent atelectasis or pneumonia. Worsening aeration from priors. Electronically Signed   By: Staci Righter M.D.   On: 11/18/2017 13:59   Dg Chest 2 View  Result Date: 11/15/2017 CLINICAL DATA:  Weakness, leg pain. EXAM: CHEST - 2 VIEW COMPARISON:  None. FINDINGS: The heart size and mediastinal contours are within normal limits. Both lungs are clear. No pneumothorax or  pleural effusion is noted. The visualized skeletal structures are unremarkable. IMPRESSION: No active cardiopulmonary disease. Electronically Signed   By: Marijo Conception, M.D.   On: 11/15/2017 16:55   Dg Abd 1 View  Result Date: 11/18/2017 CLINICAL DATA:  Acute toxic metabolic encephalopathy. Abdominal pain. EXAM: ABDOMEN - 1 VIEW COMPARISON:  CT lumbar spine 11/17/2016, abdomen radiograph 11/15/2017. FINDINGS: There is extreme constipation, distended colon filled with stool, from the RIGHT colon to the rectum. This is confirmed on prior CT lumbar. Mildly prominent small bowel loops could represent ileus or early obstruction. Prominent stomach. Lower lumbar facet arthropathy. No visible calcifications. IMPRESSION: Extreme constipation. Mildly prominent small bowel loops could represent ileus or developing early obstruction. Electronically Signed   By: Staci Righter M.D.   On: 11/18/2017 13:57   Ct Head Wo Contrast  Result Date: 11/17/2017 CLINICAL DATA:  Altered mental status EXAM: CT HEAD WITHOUT CONTRAST TECHNIQUE: Contiguous axial images were obtained from the base of the skull through the vertex without intravenous contrast. COMPARISON:  November 06, 2017 FINDINGS: Brain: The ventricles are normal in size and configuration. There is no appreciable intracranial mass, hemorrhage, extra-axial fluid collection, or midline shift. Brain parenchyma appears unremarkable. No evident acute infarct. Vascular: No hyperdense vessel. No appreciable vascular calcification. Skull: Bony calvarium appears intact. Sinuses/Orbits: There is mucosal thickening in several ethmoid air cells. Other visualized paranasal sinuses are clear. Orbits appear symmetric bilaterally. Other: There is mild opacification in several inferior mastoid air cells. Most of the mastoid air cells are clear. IMPRESSION: Mild inferior mastoid disease bilaterally. Mucosal thickening in several ethmoid air cells. Study otherwise unremarkable.  Electronically Signed   By: Lowella Grip III M.D.   On: 11/17/2017 11:16   Ct Head Wo Contrast  Result Date: 11/06/2017 CLINICAL DATA:  Ataxia, head trauma. EXAM: CT HEAD WITHOUT CONTRAST TECHNIQUE: Contiguous axial images were obtained from the base of the skull through the vertex without intravenous contrast. COMPARISON:  09/17/2016 FINDINGS: Brain: No acute intracranial abnormality. Specifically, no hemorrhage, hydrocephalus, mass lesion, acute infarction, or significant intracranial injury. Vascular: No hyperdense vessel or unexpected calcification. Skull: No acute calvarial abnormality. Sinuses/Orbits: Visualized paranasal sinuses and mastoids clear. Orbital  soft tissues unremarkable. Other: None IMPRESSION: No acute intracranial abnormality. Electronically Signed   By: Rolm Baptise M.D.   On: 11/06/2017 14:56   Ct Chest Wo Contrast  Result Date: 11/19/2017 CLINICAL DATA:  Lethargic, abnormal chest x-ray.  Pancytopenia. EXAM: CT CHEST WITHOUT CONTRAST TECHNIQUE: Multidetector CT imaging of the chest was performed following the standard protocol without IV contrast. COMPARISON:  None. FINDINGS: Cardiovascular: No significant vascular findings. Normal heart size. No pericardial effusion. Thoracic aortic atherosclerosis. Mediastinum/Nodes: No enlarged mediastinal or axillary lymph nodes. Thyroid gland, trachea, and esophagus demonstrate no significant findings. Lungs/Pleura: Bilateral small pleural effusions. Bilateral lower lobe airspace disease likely reflecting compressive atelectasis and/or pneumonia. Patchy areas of airspace disease in bilateral upper lobes and right middle lobe concerning for multilobar pneumonia. No pneumothorax. Upper Abdomen: No acute abnormality. Musculoskeletal: No acute osseous abnormality. No aggressive osseous lesion. IMPRESSION: 1. Findings concerning for multilobar pneumonia. Small bilateral pleural effusions. Electronically Signed   By: Kathreen Devoid   On: 11/19/2017  13:47   Ct Lumbar Spine Wo Contrast  Result Date: 11/17/2017 CLINICAL DATA:  Lower extremity weakness and multiple falls EXAM: CT LUMBAR SPINE WITHOUT CONTRAST TECHNIQUE: Multidetector CT imaging of the lumbar spine was performed without intravenous contrast administration. Multiplanar CT image reconstructions were also generated. COMPARISON:  None. FINDINGS: Segmentation: 5 lumbar type vertebrae. Alignment: Normal. Vertebrae: There is an intermediate sized Schmorl's node at the superior L5 endplate. There is endplate sclerosis at A2-1 and L5-S1, worst at the inferior L4 endplate. No acute fracture. No lytic or blastic lesion. Paraspinal and other soft tissues: Negative. Disc levels: T11-T12: No disc herniation or stenosis.  Normal facets. T12-L1: No disc herniation or stenosis.  Normal facets. L1-L2: No disc herniation or stenosis.  Normal facets. L2-L3: Mild disc degeneration and endplate spurring. Normal facets. No spinal canal or neural foraminal stenosis. L3-L4: Minimal endplate spurring. No disc herniation or stenosis. Mild facet hypertrophy. L4-L5: Mild facet hypertrophy. Endplate remodeling with small disc bulge and minimal spurring. No spinal canal stenosis. Mild bilateral foraminal narrowing. L5-S1: Normal disc. Moderate facet hypertrophy. No spinal canal stenosis. Mild bilateral foraminal narrowing. IMPRESSION: 1. No acute fracture or listhesis. 2. Mild bilateral foraminal stenosis at L4-5 and L5-S1, predominantly due to facet arthrosis. 3. L4-L5 and L5-S1 degenerative disc disease with endplate remodeling. 4. No spinal canal stenosis. Electronically Signed   By: Ulyses Jarred M.D.   On: 11/17/2017 17:50   Mr Brain Wo Contrast  Result Date: 11/19/2017 CLINICAL DATA:  Altered mental status, unclear cause, nonambulatory. Ataxia with head trauma was given as the history on 11/06/17. EXAM: MRI HEAD WITHOUT CONTRAST TECHNIQUE: Multiplanar, multiecho pulse sequences of the brain and surrounding  structures were obtained without intravenous contrast. COMPARISON:  Prior CT head 11/06/2017.  Also CT head 11/17/2017. FINDINGS: Brain: No acute stroke, acute hemorrhage, mass lesion, or hydrocephalus. Normal for age cerebral volume. No significant white matter disease. No parenchymal hemorrhage. BILATERAL extra-axial cerebral and cerebellar CSF fluid collections are seen, primarily over the bifrontal convexity, reflecting subdural hygromas. These measure 5 mm thick on the RIGHT and LEFT over the frontal lobes. No blood products to suggest hematoma. The hygromas have developed since the initial CT of 11/06/2017, and are similar in appearance to 11/17/2017 CT. Vascular: Normal flow voids. Skull and upper cervical spine: No visible skull fracture. Incidental arachnoid granulation projects into the LEFT paramedian frontal bone. Sinuses/Orbits: Unremarkable. Other: None.  These measure IMPRESSION: BILATERAL 5 mm thick subdural hygromas, predominantly supratentorial. No blood products to  suggest hematoma. These collections have developed since the previous CT scan of 11/06/2017, but their relation to the patient's current symptoms is unclear. A text page was placed to the ordering provider at 1:15 p.m. Electronically Signed   By: Staci Righter M.D.   On: 11/19/2017 13:16   US Abdomen Complete  Result Date: 11/19/2017 CLINICAL DATA:  Pancytopenia EXAM: ABDOMEN ULTRASOUND COMPLETE COMPARISON:  None. FINDINGS: Gallbladder: No gallstones or wall thickening visualized. No sonographic Murphy sign noted by sonographer. Common bile duct: Diameter: Normal at 5 mm Liver: Normal liver parenchyma. Portal vein is patent on color Doppler imaging with normal direction of blood flow towards the liver. IVC: No abnormality visualized. Pancreas: Visualized portion unremarkable. Spleen: Size and appearance within normal limits. Right Kidney: Length: 9.9 cm.  There is Left Kidney: Length: 9.2 cm. Echogenicity within normal limits. No  mass or hydronephrosis visualized. Abdominal aorta: No aneurysm visualized. Other findings: Large amount of bowel gas and stool limits exam. Small amount of ascites. IMPRESSION: 1. No acute abdominal findings. 2. Small volume ascites. 3. Limitations due to bowel gas and stool. Electronically Signed   By: Suzy Bouchard M.D.   On: 11/19/2017 15:42   Dg Chest Port 1 View  Result Date: 11/18/2017 CLINICAL DATA:  Hypothermia and altered mental status. Poor historian. Current smoker. EXAM: PORTABLE CHEST 1 VIEW COMPARISON:  11/15/2017 FINDINGS: Grossly unchanged cardiac silhouette and mediastinal contours given reduced lung volumes. Interval development potential small bilateral effusions with associated worsening bibasilar opacities, left greater than right. Mild pulmonary is congestion without frank evidence of edema. No pneumothorax. Ossicles are again noted superior to the left coracoid process. No acute osseus abnormalities. IMPRESSION: Suspected small bilateral effusions with worsening bibasilar opacities, left greater than right, atelectasis versus infiltrate. Further evaluation with a PA and lateral chest radiograph may be obtained as clinically indicated. Electronically Signed   By: Sandi Mariscal M.D.   On: 11/18/2017 08:58   Dg Abd 2 Views  Result Date: 11/15/2017 CLINICAL DATA:  Distention EXAM: ABDOMEN - 2 VIEW COMPARISON:  None. FINDINGS: Large stool burden throughout the colon. There is normal bowel gas pattern. No free air. No organomegaly or suspicious calcification. No acute bony abnormality. Visualized lung bases clear. IMPRESSION: Large stool burden.  No acute findings. Electronically Signed   By: Rolm Baptise M.D.   On: 11/15/2017 18:40   [place] Ekg Site Rite  Result Date: 11/18/2017 If Site Rite image not attached, placement could not be confirmed due to current cardiac rhythm.   Assessment and plan-  Patient is a 57 y.o. male who has psychiatric disordered presents with altered mental  status.   # Thrombocytopenia, lymphopenia: pathology smear showed Pancytopenia with inappropriately low reticulocytes for degree of anemia  Normocytic anemia with rare nucleated RBCs. Slight left shift in neutrophils. Normal platelet morphology.  Findings are suggestive of bone marrow suppression or hypoproliferative state. Mostly likely combination of drug induced cytopenia and infection.  Continue monitor labs daily.   # AMS, much improved after psychiatry medication were discontinued . Neurology on board.  #Acute bilateral aspiration pneumonia, likely was secondary to his initial altered mental status. Continue IV antibiotics.   Discussed with Dr.Salary.  Thank you for allowing me to participate in the care of this patient.   Earlie Server, MD, PhD Hematology Oncology [facility] Pager- [phone] 11/20/2017

## 2017-11-20 NOTE — Progress Notes (Signed)
Rectal temp 94.88F. Bair hugger placed on patient. Will continue to monitor.

## 2017-11-20 NOTE — Progress Notes (Signed)
Patient was receiving care. Chaplain conferred with the patient's nurse on the current situation. Chaplain offered to return, if needed.

## 2017-11-20 NOTE — Progress Notes (Addendum)
Nutrition Brief Note  Pt seen by SLP today and initiated on Dysphagia 1/ nectar thick diet. Pt is at goal rate on the TPN. RD will add supplements to help pt meet his estimated needs. It pt able to tolerate po diet and supplements, would recommend change to 1/2 rate of TPN tomorrow morning and discontinue tomorrow evening if continuing to tolerate diet.   Will initiate:  Nepro Shake po BID, each supplement provides 425 kcal and 19 grams protein Magic cup TID with meals, each supplement provides 290 kcal and 9 grams of protein  Austin Distance MS, RD, LDN Pager #- [phone] Office#- [phone] After Hours Pager: [phone]

## 2017-11-20 NOTE — Clinical Social Work Note (Signed)
Patient's pasrr has been received. There are currently no bed offers for patient and returning him to his previous living arrangement would be ideal. Shela Leff MSW,LCSW [phone]

## 2017-11-20 NOTE — Progress Notes (Signed)
[facility] [place] NAME: Rowyn Spilde    MR#:  [id number]  DATE OF BIRTH:  Nov 07, 1960  SUBJECTIVE:  CHIEF COMPLAINT:   Chief Complaint  Patient presents with  . Leg Pain  Patient now brightly awake, alert, talking, participating with physical therapy, family at the bedside-updated with all questions answered, to start on pured diet and discussion with speech therapy, discontinue TPN after today's dosing REVIEW OF SYSTEMS:  CONSTITUTIONAL: No fever, fatigue or weakness.  EYES: No blurred or double vision.  EARS, NOSE, AND THROAT: No tinnitus or ear pain.  RESPIRATORY: No cough, shortness of breath, wheezing or hemoptysis.  CARDIOVASCULAR: No chest pain, orthopnea, edema.  GASTROINTESTINAL: No nausea, vomiting, diarrhea or abdominal pain.  GENITOURINARY: No dysuria, hematuria.  ENDOCRINE: No polyuria, nocturia,  HEMATOLOGY: No anemia, easy bruising or bleeding SKIN: No rash or lesion. MUSCULOSKELETAL: No joint pain or arthritis.   NEUROLOGIC: No tingling, numbness, weakness.  PSYCHIATRY: No anxiety or depression.   ROS  DRUG ALLERGIES:  No Known Allergies  VITALS:  Blood pressure 115/81, pulse 78, temperature 97.7 F (36.5 C), temperature source Axillary, resp. rate 19, height 6\' 1"  (1.854 m), weight 75.2 kg, SpO2 98 %.  PHYSICAL EXAMINATION:  GENERAL:  57 y.o.-year-old patient lying in the bed with no acute distress.  EYES: Pupils equal, round, reactive to light and accommodation. No scleral icterus. Extraocular muscles intact.  HEENT: Head atraumatic, normocephalic. Oropharynx and nasopharynx clear.  NECK:  Supple, no jugular venous distention. No thyroid enlargement, no tenderness.  LUNGS: Normal breath sounds bilaterally, no wheezing, rales,rhonchi or crepitation. No use of accessory muscles of respiration.  CARDIOVASCULAR: S1, S2 normal. No murmurs, rubs, or gallops.  ABDOMEN: Soft, nontender, nondistended. Bowel sounds  present. No organomegaly or mass.  EXTREMITIES: No pedal edema, cyanosis, or clubbing.  NEUROLOGIC: Cranial nerves II through XII are intact. Muscle strength 5/5 in all extremities. Sensation intact. Gait not checked.  PSYCHIATRIC: The patient is alert and oriented x 3.  SKIN: No obvious rash, lesion, or ulcer.   Physical Exam LABORATORY PANEL:   CBC Recent Labs  Lab 11/20/17 0547  WBC 2.5*  HGB 10.2*  HCT 29.0*  PLT 38*   ------------------------------------------------------------------------------------------------------------------  Chemistries  Recent Labs  Lab 11/19/17 0432 11/20/17 0547  NA 144 139  K 4.3 3.9  CL 112* 107  CO2 28 29  GLUCOSE 118* 116*  BUN 25* 30*  CREATININE 1.17 0.98  CALCIUM 9.0 9.0  MG 1.6* 1.9  AST 36  --   ALT 29  --   ALKPHOS 65  --   BILITOT 0.6  --    ------------------------------------------------------------------------------------------------------------------  Cardiac Enzymes Recent Labs  Lab 11/15/17 1622  TROPONINI <0.03   ------------------------------------------------------------------------------------------------------------------  RADIOLOGY:  Dg Chest 2 View  Result Date: 11/18/2017 CLINICAL DATA:  Acute toxic metabolic encephalopathy. EXAM: CHEST - 2 VIEW COMPARISON:  Chest film earlier in the day. FINDINGS: Low lung volumes accentuate the cardiac silhouette which is probably within normal limits, given the degree of inspiration. BILATERAL effusions are increased, particularly on the RIGHT, with associated increasing bibasilar opacities which could represent atelectasis or infiltrate. Tortuous aorta. No osseous findings of significance. IMPRESSION: BILATERAL RIGHT greater than LEFT pleural effusions. Bibasilar opacities which could represent atelectasis or pneumonia. Worsening aeration from priors. Electronically Signed   By: Staci Righter M.D.   On: 11/18/2017 13:59   Dg Abd 1 View  Result Date:  11/18/2017 CLINICAL DATA:  Acute toxic metabolic  encephalopathy. Abdominal pain. EXAM: ABDOMEN - 1 VIEW COMPARISON:  CT lumbar spine 11/17/2016, abdomen radiograph 11/15/2017. FINDINGS: There is extreme constipation, distended colon filled with stool, from the RIGHT colon to the rectum. This is confirmed on prior CT lumbar. Mildly prominent small bowel loops could represent ileus or early obstruction. Prominent stomach. Lower lumbar facet arthropathy. No visible calcifications. IMPRESSION: Extreme constipation. Mildly prominent small bowel loops could represent ileus or developing early obstruction. Electronically Signed   By: Staci Righter M.D.   On: 11/18/2017 13:57   Ct Chest Wo Contrast  Result Date: 11/19/2017 CLINICAL DATA:  Lethargic, abnormal chest x-ray.  Pancytopenia. EXAM: CT CHEST WITHOUT CONTRAST TECHNIQUE: Multidetector CT imaging of the chest was performed following the standard protocol without IV contrast. COMPARISON:  None. FINDINGS: Cardiovascular: No significant vascular findings. Normal heart size. No pericardial effusion. Thoracic aortic atherosclerosis. Mediastinum/Nodes: No enlarged mediastinal or axillary lymph nodes. Thyroid gland, trachea, and esophagus demonstrate no significant findings. Lungs/Pleura: Bilateral small pleural effusions. Bilateral lower lobe airspace disease likely reflecting compressive atelectasis and/or pneumonia. Patchy areas of airspace disease in bilateral upper lobes and right middle lobe concerning for multilobar pneumonia. No pneumothorax. Upper Abdomen: No acute abnormality. Musculoskeletal: No acute osseous abnormality. No aggressive osseous lesion. IMPRESSION: 1. Findings concerning for multilobar pneumonia. Small bilateral pleural effusions. Electronically Signed   By: Kathreen Devoid   On: 11/19/2017 13:47   Mr Brain Wo Contrast  Result Date: 11/19/2017 CLINICAL DATA:  Altered mental status, unclear cause, nonambulatory. Ataxia with head trauma was given  as the history on 11/06/17. EXAM: MRI HEAD WITHOUT CONTRAST TECHNIQUE: Multiplanar, multiecho pulse sequences of the brain and surrounding structures were obtained without intravenous contrast. COMPARISON:  Prior CT head 11/06/2017.  Also CT head 11/17/2017. FINDINGS: Brain: No acute stroke, acute hemorrhage, mass lesion, or hydrocephalus. Normal for age cerebral volume. No significant white matter disease. No parenchymal hemorrhage. BILATERAL extra-axial cerebral and cerebellar CSF fluid collections are seen, primarily over the bifrontal convexity, reflecting subdural hygromas. These measure 5 mm thick on the RIGHT and LEFT over the frontal lobes. No blood products to suggest hematoma. The hygromas have developed since the initial CT of 11/06/2017, and are similar in appearance to 11/17/2017 CT. Vascular: Normal flow voids. Skull and upper cervical spine: No visible skull fracture. Incidental arachnoid granulation projects into the LEFT paramedian frontal bone. Sinuses/Orbits: Unremarkable. Other: None.  These measure IMPRESSION: BILATERAL 5 mm thick subdural hygromas, predominantly supratentorial. No blood products to suggest hematoma. These collections have developed since the previous CT scan of 11/06/2017, but their relation to the patient's current symptoms is unclear. A text page was placed to the ordering provider at 1:15 p.m. Electronically Signed   By: Staci Righter M.D.   On: 11/19/2017 13:16   US Abdomen Complete  Result Date: 11/19/2017 CLINICAL DATA:  Pancytopenia EXAM: ABDOMEN ULTRASOUND COMPLETE COMPARISON:  None. FINDINGS: Gallbladder: No gallstones or wall thickening visualized. No sonographic Murphy sign noted by sonographer. Common bile duct: Diameter: Normal at 5 mm Liver: Normal liver parenchyma. Portal vein is patent on color Doppler imaging with normal direction of blood flow towards the liver. IVC: No abnormality visualized. Pancreas: Visualized portion unremarkable. Spleen: Size and  appearance within normal limits. Right Kidney: Length: 9.9 cm.  There is Left Kidney: Length: 9.2 cm. Echogenicity within normal limits. No mass or hydronephrosis visualized. Abdominal aorta: No aneurysm visualized. Other findings: Large amount of bowel gas and stool limits exam. Small amount of ascites. IMPRESSION: 1. No acute abdominal  findings. 2. Small volume ascites. 3. Limitations due to bowel gas and stool. Electronically Signed   By: Suzy Bouchard M.D.   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, EEG noted for slowing/no seizure activity, CT head noted for sinus disease, RPR was nonreactive, neurochecks per routine, aspiration/fall precautions while in house  *Acute Dysphasia Stable Per speech therapy-pured diet with nectar thickened liquids, continue aspiration precautions  *Acute on chronic moderate to severe protein calorie/energy malnutrition Dietary consulted, diet advancement per above, discontinue TPN after today, follow-up on CEA, AFP, CA 19-9  *Pancytopenia Stable Oncology input appreciated Most likely related to psychotropic medication Check abdominal ultrasound, consult oncology/hematology for expert opinion, anemia work-up consistent with iron deficiency-treated with IV iron, follow-up on antiplatelet antibodies, CBC daily, transfuse as needed  *Acute kidney injury Resolved with IV fluids for rehydration  *Acute bilateral aspiration pneumonia, present on admission  Secondary to encephalopathy Chest x-rays were always inconclusive, CT of the chest noted for bilateral pneumonia Pneumonia protocol, empiric Zosyn for 5-day course, aspiration  precautions  *Schizoaffective/bipolar disorder Psychiatry input appreciated  Psychotropic medications were discontinued which resulted in resolution of encephalopathy     *Tobacco smoking abuse/dependency  Stable Nicotine patch and cessation counseling ordered  *Acute hypomagnesemia Replete with IV magnesium  Disposition to skilled nursing facility in 1 to 2 days barring any complications  All the records are reviewed and case discussed with Care Management/Social Workerr. Management plans discussed with the patient, family and they are in agreement.  CODE STATUS: full  TOTAL TIME TAKING CARE OF THIS PATIENT: 45 minutes.   POSSIBLE D/C IN 3 DAYS, DEPENDING ON CLINICAL CONDITION.   [name] Lagretta Loseke M.D on 11/20/2017   Between 7am to 6pm - Pager - [phone]  After 6pm go to [email or web address] - password [email or web address]  Office  [phone]  CC: Primary care physician; Raelyn Number, MD  Note: This dictation was prepared with Dragon dictation along with smaller phrase technology. Any transcriptional errors that result from this process are unintentional.

## 2017-11-20 NOTE — Consult Note (Signed)
Consultation Note Date: 11/20/2017   Patient Name: Austin Gates  DOB: September 11, 1960  MRN: [id number]  Age / Sex: 57 y.o., male  PCP: Raelyn Number, MD Referring Physician: Gorden Harms, MD  Reason for Consultation: Establishing goals of care  HPI/Patient Profile: 57 y.o. male admitted on 11/15/2017 from [facility] group home. He presented to the ED with complaints of increased confusion, generalized weakness, and low blood pressure. He has a past medical history significant of mental retardation, schizoaffective disorder, tobacco use, and bipolar disorder.  Patient currently lives at the group home and was unable to provide history due to mental status.  During his ED course it was reported that most of the patient's symptoms have been going on for 5 to 7 days prior to admission and gradually worsening.  No reports of fever, chills, nausea, vomiting, diarrhea, or bleeding.  Facility worker also reports patient has experienced some weight loss in the past months despite having a good appetite and also noted that patient was started on a new medication for nocturnal enuresis (desmopressin).  They also reported that patient's lithium has been discontinued 2 weeks prior.  Patient was given a total of 4 L of fluid due to hypotension with systolic blood pressure in the 70s.  Sodium 148.  Creatinine 1.57.  BUN 44.  Platelet 96.  UA negative for UTI.  Chest x-ray is negative for acute cardiopulmonary disease.  Abdominal x-ray showed large stool burden.  Admission patient has been seen by neurology and psychiatry.  MRI of the brain noted.  Bilateral small hygromas with no needed intervention, EEG noted for slowing/no seizure activity, CT head noted for sinus disease, psychotropic meds were discontinued.  Patient also seen by speech and dietitian.  Patient was started on TPN, and after increasing alertness has  now been placed on pured diet with nectar thickened liquids.  Palliative medicine team consulted for goals of care discussion.  Clinical Assessment and Goals of Care: I have reviewed medical records including lab results, imaging, Epic notes, and MAR, received report from the bedside RN, and assessed the patient. I then met at the bedside with [name] (Legal guardian), [name] (director of group home), and sister, [name] to discuss diagnosis prognosis, [place], EOL wishes, disposition and options.  Patient was alert and oriented x3.  Nurse Tech at the bedside feeding patient lunch.  Patient is tolerating meal without complications, no coughing noted.  Due to patient's history and mental status goals of care discussion mainly occurred between his sisters, caregiver, and I.   I introduced Palliative Medicine as specialized medical care for people living with serious illness. It focuses on providing relief from the symptoms and stress of a serious illness. The goal is to improve quality of life for both the patient and the family.  We discussed a brief life review of the patient.  Sisters reported patient has suffered from mental illness majority of his life.  They report he was taking out of school at the age of 49 due  to violent behaviors and placed in multiple group homes.  Sister states that patient spent several years at [facility] and other facilities in surrounding areas such as [place] and [place].   As far as functional and nutritional status according to sisters and caregiver patient was ambulatory without assistive devices at the group home.  [name] reports that over the past 2 to 3 weeks patient started having an increase in falls which was associated to medication changes.  She also reports patient has become incontinent at times and was requiring incontinence briefs for protection.  Sisters also states that patient has suffered from weight loss over the past several months.  They reports  patient has loss approximately 15 pounds in the past 2 months and have lost over 40 pounds in the past year and a half.  At the facility family reports patient is able to perform most ADLs independently, interacts with other residents, and is generally a fun going gentleman.  We discussed his current illness and what it means in the larger context of his on-going co-morbidities.  Sisters report they feel that most of his hospitalizations are due more so to medication changes.  They remain hopeful that patient is on the correct regimen and medications will be adjusted prior to discharge without any further complications.  I attempted to elicit values and goals of care important to the patient.    The difference between aggressive medical intervention and comfort care was considered in light of the patient's goals of care.  Patient comorbidities and current illness we discussed patient's current CODE STATUS and any documented advanced directives.  [name], Legal guardian reports at this time patient is to remain a full code.  Her sister agreed and states they are okay with CPR, defibrillation, and intubation.  [name] reports in the event of patient being placed on life support she would know when to discontinue care based on doctors reports at that time.  He states however we must give him a fighting chance regardless of his psychiatric history.  Palliative Care services outpatient were explained and offered.  Sisters and caregiver have requested some time to think about palliative services and will let myself or medical team know if they decide to have services at discharge.  At this time the goal is for patient to be discharged to SNF for rehabilitation with hopes of returning back to his group home to be amongst his friends.  Support given.  Questions and concerns were addressed.The family was encouraged to call with questions or concerns.  PMT will continue to support holistically.  LEGAL GUARDIAN     SUMMARY OF RECOMMENDATIONS    Full code  Continue to treat the treatable while hospitalized with aggressive measures.  [name], Legal Guardian is considering outpatient palliative. She will let me know if interested in services, ask to follow up with her tomorrow.   PMT will continue to follow as needed.   Code Status/Advance Care Planning:  Full code  Palliative Prophylaxis:   Aspiration, Bowel Regimen, Delirium Protocol and Oral Care  Additional Recommendations (Limitations, Scope, Preferences):  Full Scope Treatment  Psycho-social/Spiritual:   Desire for further Chaplaincy support:no  Prognosis:   Unable to determine-Guarded in the setting of psychiatric disorders, malnutrition, decreased mobility  Discharge Planning: SNF w/Rehab and later to return to Group home per family. Family considering outpatient Palliative services.       Primary Diagnoses: Present on Admission: . ARF (acute renal failure) ([place]) . 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  Schizoaffective disorder, bipolar type ([place])   I have reviewed the medical record, interviewed the patient and family, and examined the patient. The following aspects are pertinent.  Past Medical History:  Diagnosis Date  . Diabetes mellitus without complication ([place])   . Hypertension    Social History   Socioeconomic History  . Marital status: Single    Spouse name: Not on file  . Number of children: Not on file  . Years of education: Not on file  . Highest education level: Not on file  Occupational History  . Not on file  Social Needs  . Financial resource strain: Not on file  . Food insecurity:    Worry: Not on file    Inability: Not on file  . Transportation needs:    Medical: Not on file    Non-medical: Not on file  Tobacco Use  . Smoking status: Current Every Day Smoker    Packs/day: 1.00    Types: Cigarettes  . Smokeless tobacco: Never Used  Substance and Sexual Activity  . Alcohol use: No   . Drug use: No  . Sexual activity: Never  Lifestyle  . Physical activity:    Days per week: Not on file    Minutes per session: Not on file  . Stress: Not on file  Relationships  . Social connections:    Talks on phone: Not on file    Gets together: Not on file    Attends religious service: Not on file    Active member of club or organization: Not on file    Attends meetings of clubs or organizations: Not on file    Relationship status: Not on file  Other Topics Concern  . Not on file  Social History Narrative  . Not on file   Family History  Problem Relation Age of Onset  . Diabetes Mother    Scheduled Meds: . cholecalciferol  5,000 Units Oral Daily  . docusate sodium  100 mg Oral BID  . feeding supplement (NEPRO CARB STEADY)  237 mL Oral BID BM  . guaiFENesin  600 mg Oral BID  . insulin aspart  0-9 Units Subcutaneous Q6H  . midodrine  5 mg Oral TID WC  . nicotine  14 mg Transdermal Daily  . polyethylene glycol  17 g Oral QODAY  . sodium chloride flush  10-40 mL Intracatheter Q12H   Continuous Infusions: . Marland KitchenTPN (CLINIMIX-E) Adult     And  . Fat emulsion    . iron dextran (INFED/DEXFERRUM) infusion 100 mg (11/20/17 1411)  . piperacillin-tazobactam (ZOSYN)  IV 3.375 g (11/20/17 1049)  . Marland KitchenTPN (CLINIMIX-E) Adult 83 mL/hr at 11/19/17 1855   PRN Meds:.acetaminophen **OR** acetaminophen, bisacodyl, haloperidol lactate, ondansetron **OR** ondansetron (ZOFRAN) IV, phenol, sodium chloride flush Medications Prior to Admission:  Prior to Admission medications   Medication Sig Start Date End Date Taking? Authorizing Provider  Cholecalciferol (VITAMIN D-3) 5000 units TABS Take 1 tablet by mouth daily.   Yes [provider]  cloZAPine (CLOZARIL) 100 MG tablet Take 1 tablet (100 mg total) by mouth 2 (two) times daily. 10/06/16  Yes Hildred Priest, MD  cloZAPine (CLOZARIL) 100 MG tablet Take 3 tablets (300 mg total) by mouth at bedtime. 10/06/16  Yes  Hildred Priest, MD  desmopressin (DDAVP) 0.2 MG tablet Take 0.2 mg by mouth at bedtime.   Yes [provider]  divalproex (DEPAKOTE ER) 500 MG 24 hr tablet Take 3 tablets (1,500 mg total) by mouth at bedtime. 10/06/16  Yes Hildred Priest, MD  docusate sodium (COLACE) 100 MG capsule Take 1 capsule (100 mg total) by mouth 2 (two) times daily. 10/04/16  Yes Hildred Priest, MD  hydrALAZINE (APRESOLINE) 25 MG tablet Take 25 mg by mouth at bedtime.   Yes [provider]  metoprolol tartrate (LOPRESSOR) 25 MG tablet Take 0.5 tablets (12.5 mg total) by mouth 2 (two) times daily. 10/06/16  Yes Hildred Priest, MD  ibuprofen (ADVIL,MOTRIN) 600 MG tablet Take 1 tablet (600 mg total) by mouth every 8 (eight) hours as needed. 10/13/17   Earleen Newport, MD  polyethylene glycol [facility] / Floria Raveling) packet Take 17 g by mouth every other day. Patient not taking: Reported on 11/16/2017 10/04/16   Hildred Priest, MD   No Known Allergies Review of Systems  Unable to perform ROS: Psychiatric disorder    Physical Exam  Constitutional: He is oriented to person, place, and time. Vital signs are normal. He is cooperative. He appears ill.  Chronically ill appearing, thin   Cardiovascular: Normal rate, regular rhythm, normal heart sounds and normal pulses.  Pulmonary/Chest: Effort normal. He has decreased breath sounds. He has rhonchi.  Abdominal: Normal appearance and bowel sounds are normal.  TPN   Musculoskeletal:  Generalized weakness   Neurological: He is alert and oriented to person, place, and time.  Skin: Skin is warm, dry and intact.  Psychiatric: Cognition and memory are impaired. He expresses inappropriate judgment.  Nursing note and vitals reviewed.   Vital Signs: BP 114/77   Pulse 72   Temp 98 F (36.7 C) (Axillary)   Resp 19   Ht _0  (1.854 m)   Wt 75.2 kg   SpO2 100%   BMI 21.87 kg/m  Pain Scale: PAINAD POSS  *See Group Information*: 1-Acceptable,Awake and alert Pain Score: 0-No pain   SpO2: SpO2: 100 % O2 Device:SpO2: 100 % O2 Flow Rate: .O2 Flow Rate (L/min): 2 L/min  IO: Intake/output summary:   Intake/Output Summary (Last 24 hours) at 11/20/2017 1459 Last data filed at 11/20/2017 0600 Gross per 24 hour  Intake 87.71 ml  Output 1100 ml  Net -1012.29 ml    LBM: Last BM Date: 11/19/17 Baseline Weight: Weight: 65.8 kg Most recent weight: Weight: 75.2 kg     Palliative Assessment/Data: PPS 40%   Time In: 1300 Time Out: 1415 Time Total: 75 min.   Greater than 50%  of this time was spent counseling and coordinating care related to the above assessment and plan.  Signed by:  Alda Lea, AGPCNP-BC Palliative Medicine Team  Phone: [phone] Fax: [phone] Pager: [phone] Amion: Bjorn Pippin    Please contact Palliative Medicine Team phone at [phone] for questions and concerns.  For individual provider: See Shea Evans

## 2017-11-21 DIAGNOSIS — R4702 Dysphasia: Secondary | ICD-10-CM

## 2017-11-21 LAB — MAGNESIUM: Magnesium: 1.8 mg/dL (ref 1.7–2.4)

## 2017-11-21 LAB — BASIC METABOLIC PANEL
ANION GAP: 6 (ref 5–15)
BUN: 28 mg/dL — AB (ref 6–20)
CALCIUM: 9 mg/dL (ref 8.9–10.3)
CO2: 27 mmol/L (ref 22–32)
Chloride: 107 mmol/L (ref 98–111)
Creatinine, Ser: 1.1 mg/dL (ref 0.61–1.24)
GFR calc Af Amer: >60 mL/min (ref >60)
GFR calc non Af Amer: >60 mL/min (ref >60)
GLUCOSE: 131 mg/dL — AB (ref 70–99)
Potassium: 4 mmol/L (ref 3.5–5.1)
Sodium: 140 mmol/L (ref 135–145)

## 2017-11-21 LAB — CBC
HCT: 27.3 % — ABNORMAL LOW (ref 40.0–52.0)
Hemoglobin: 9.5 g/dL — ABNORMAL LOW (ref 13.0–18.0)
MCH: 33.4 pg (ref 26.0–34.0)
MCHC: 34.6 g/dL (ref 32.0–36.0)
MCV: 96.4 fL (ref 80.0–100.0)
Platelets: 42 10*3/uL — ABNORMAL LOW (ref 150–440)
RBC: 2.84 MIL/uL — AB (ref 4.40–5.90)
RDW: 15.8 % — AB (ref 11.5–14.5)
WBC: 2.7 10*3/uL — AB (ref 3.8–10.6)

## 2017-11-21 LAB — PROTIME-INR
INR: 1.1
Prothrombin Time: 14.1 seconds (ref 11.4–15.2)

## 2017-11-21 LAB — ABO/RH: ABO/RH(D): B POS

## 2017-11-21 LAB — CA 19-9 (SERIAL): CAN 19-9: 3 U/mL (ref 0–35)

## 2017-11-21 LAB — HEPATITIS PANEL, ACUTE
Hep A IgM: NEGATIVE
Hep B C IgM: NEGATIVE
Hepatitis B Surface Ag: NEGATIVE

## 2017-11-21 LAB — GLUCOSE, CAPILLARY
GLUCOSE-CAPILLARY: 137 mg/dL — AB (ref 70–99)
Glucose-Capillary: 116 mg/dL — ABNORMAL HIGH (ref 70–99)
Glucose-Capillary: 110 mg/dL — ABNORMAL HIGH (ref 70–99)
Glucose-Capillary: 96 mg/dL (ref 70–99)

## 2017-11-21 LAB — HAPTOGLOBIN: Haptoglobin: 137 mg/dL (ref 34–200)

## 2017-11-21 LAB — AFP TUMOR MARKER: AFP, Serum, Tumor Marker: 3 ng/mL (ref 0.0–8.3)

## 2017-11-21 LAB — CEA: CEA: 11.1 ng/mL — ABNORMAL HIGH (ref 0.0–4.7)

## 2017-11-21 LAB — PHOSPHORUS: Phosphorus: 3.2 mg/dL (ref 2.5–4.6)

## 2017-11-21 MED ORDER — ACETAMINOPHEN 650 MG RE SUPP
650.0000 mg | Freq: Once | RECTAL | Status: AC
  Administered 2017-11-21: 650 mg via RECTAL
  Filled 2017-11-21: qty 1

## 2017-11-21 MED ORDER — CHLORHEXIDINE GLUCONATE 0.12 % MT SOLN
15.0000 mL | Freq: Two times a day (BID) | OROMUCOSAL | Status: DC
  Administered 2017-11-21 – 2017-11-24 (×3): 15 mL via OROMUCOSAL
  Filled 2017-11-21 (×2): qty 15

## 2017-11-21 MED ORDER — ACETAMINOPHEN 325 MG PO TABS: 650.0000 mg | ORAL_TABLET | Freq: Once | ORAL | Status: DC

## 2017-11-21 MED ORDER — FAT EMULSION PLANT BASED 20 % IV EMUL
250.0000 mL | INTRAVENOUS | Status: AC
  Administered 2017-11-21: 250 mL via INTRAVENOUS
  Filled 2017-11-21: qty 250

## 2017-11-21 MED ORDER — DIPHENHYDRAMINE HCL 50 MG/ML IJ SOLN
12.5000 mg | Freq: Once | INTRAMUSCULAR | Status: AC
  Administered 2017-11-21: 12.5 mg via INTRAVENOUS
  Filled 2017-11-21: qty 1

## 2017-11-21 MED ORDER — ORAL CARE MOUTH RINSE: 15.0000 mL | Freq: Two times a day (BID) | OROMUCOSAL | Status: DC

## 2017-11-21 MED ORDER — TRACE MINERALS CR-CU-MN-SE-ZN 10-1000-500-60 MCG/ML IV SOLN
INTRAVENOUS | Status: AC
  Administered 2017-11-21: 19:00:00 via INTRAVENOUS
  Filled 2017-11-21: qty 1992

## 2017-11-21 MED ORDER — SODIUM CHLORIDE 0.9% IV SOLUTION
Freq: Once | INTRAVENOUS | Status: AC
  Administered 2017-11-21: 15:00:00 via INTRAVENOUS

## 2017-11-21 NOTE — Consult Note (Signed)
[facility] Consult   Reason for Consult: Follow-up consult 57 year old man with schizoaffective disorder and developmental disability Referring Physician: Tressia Miners Patient Identification: Austin Gates MRN:  [id number] Principal Diagnosis: Schizoaffective disorder, bipolar type [facility]) Diagnosis:   Patient Active Problem List   Diagnosis Date Noted  . Protein-calorie malnutrition, severe [E43] 11/20/2017  . Aspiration pneumonia of both lungs ([place]) [J69.0]   . Hypotension [I95.9]   . Pancytopenia ([place]) [D61.818]   . ARF (acute renal failure) ([place]) [N17.9] 11/15/2017  . Elevated lithium level [R79.89] 11/06/2017  . Fall [W19.XXXA] 11/06/2017  . Schizoaffective disorder, bipolar type ([place]) [F25.0] 10/04/2016  . Tobacco use disorder [F17.200] 09/30/2016  . 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 male patient admitted with "I am okay".  HPI: Follow-up for this patient with altered mental status probably multifactorial.  Patient seen chart reviewed.  Patient was awake and responsive.  Speech is garbled.  Able to answer a few very simple questions.  Affect flat.  Not agitated not aggressive.  Patient appears to be off of all of his psychiatric medicine at this point.  Past Psychiatric History: Long-standing chronic mental health problems and developmental disability  Risk to Self:   Risk to Others:   Prior Inpatient Therapy:   Prior Outpatient Therapy:    Past Medical History:  Past Medical History:  Diagnosis Date  . Diabetes mellitus without complication ([place])   . Hypertension     Past Surgical History:  Procedure Laterality Date  . KNEE ARTHROSCOPY     Family History:  Family History  Problem Relation Age of Onset  . Diabetes Mother    Family Psychiatric  History: See previous note Social History:  Social History   Substance and Sexual Activity   Alcohol Use No     Social History   Substance and Sexual Activity  Drug Use No    Social History   Socioeconomic History  . Marital status: Single    Spouse name: Not on file  . Number of children: Not on file  . Years of education: Not on file  . Highest education level: Not on file  Occupational History  . Not on file  Social Needs  . Financial resource strain: Not on file  . Food insecurity:    Worry: Not on file    Inability: Not on file  . Transportation needs:    Medical: Not on file    Non-medical: Not on file  Tobacco Use  . Smoking status: Current Every Day Smoker    Packs/day: 1.00    Types: Cigarettes  . Smokeless tobacco: Never Used  Substance and Sexual Activity  . Alcohol use: No  . Drug use: No  . Sexual activity: Never  Lifestyle  . Physical activity:    Days per week: Not on file    Minutes per session: Not on file  . Stress: Not on file  Relationships  . Social connections:    Talks on phone: Not on file    Gets together: Not on file    Attends religious service: Not on file    Active member of club or organization: Not on file    Attends meetings of clubs or organizations: Not on file    Relationship status: Not on file  Other Topics Concern  . Not on file  Social History Narrative  . Not on file  Additional Social History:    Allergies:  No Known Allergies  Labs:  Results for orders placed or performed during the hospital encounter of 11/15/17 (from the past 48 hour(s))  Glucose, capillary     Status: None   Collection Time: 11/19/17  6:56 PM  Result Value Ref Range   Glucose-Capillary 85 70 - 99 mg/dL  Glucose, capillary     Status: None   Collection Time: 11/19/17 11:29 PM  Result Value Ref Range   Glucose-Capillary 95 70 - 99 mg/dL  Glucose, capillary     Status: Abnormal   Collection Time: 11/20/17  5:38 AM  Result Value Ref Range   Glucose-Capillary 125 (H) 70 - 99 mg/dL  CBC     Status: Abnormal   Collection Time:  11/20/17  5:47 AM  Result Value Ref Range   WBC 2.5 (L) 3.8 - 10.6 K/uL   RBC 3.09 (L) 4.40 - 5.90 MIL/uL   Hemoglobin 10.2 (L) 13.0 - 18.0 g/dL   HCT 29.0 (L) 40.0 - 52.0 %   MCV 93.7 80.0 - 100.0 fL   MCH 32.9 26.0 - 34.0 pg   MCHC 35.1 32.0 - 36.0 g/dL   RDW 15.2 (H) 11.5 - 14.5 %   Platelets 38 (L) 150 - 440 K/uL    Comment: Performed at [facility], [address] [place], [place] [address]  Differential     Status: Abnormal   Collection Time: 11/20/17  5:47 AM  Result Value Ref Range   Neutrophils Relative % 73 %   Neutro Abs 1.8 1.4 - 6.5 K/uL   Lymphocytes Relative 13 %   Lymphs Abs 0.3 (L) 1.0 - 3.6 K/uL   Monocytes Relative 11 %   Monocytes Absolute 0.3 0.2 - 1.0 K/uL   Eosinophils Relative 2 %   Eosinophils Absolute 0.0 0 - 0.7 K/uL   Basophils Relative 1 %   Basophils Absolute 0.0 0 - 0.1 K/uL    Comment: Performed at [facility], [address] [place], [place] [address]  Triglycerides     Status: None   Collection Time: 11/20/17  5:47 AM  Result Value Ref Range   Triglycerides 62 <150 mg/dL    Comment: Performed at [facility], [address] [place], [place] [address]  Prealbumin     Status: Abnormal   Collection Time: 11/20/17  5:47 AM  Result Value Ref Range   Prealbumin 8.6 (L) 18 - 38 mg/dL    Comment: Performed at [facility], [address] [place], [place] [address]  Basic metabolic panel     Status: Abnormal   Collection Time: 11/20/17  5:47 AM  Result Value Ref Range   Sodium 139 135 - 145 mmol/L   Potassium 3.9 3.5 - 5.1 mmol/L   Chloride 107 98 - 111 mmol/L   CO2 29 22 - 32 mmol/L   Glucose, Bld 116 (H) 70 - 99 mg/dL   BUN 30 (H) 6 - 20 mg/dL   Creatinine, Ser 0.98 0.61 - 1.24 mg/dL   Calcium 9.0 8.9 - 10.3 mg/dL   GFR calc non Af Amer >60 >60 mL/min   GFR calc Af Amer >60 >60 mL/min    Comment: (NOTE) The eGFR has been calculated using the CKD EPI equation. This calculation has not been validated  in all clinical situations. eGFR's persistently <60 mL/min signify possible Chronic Kidney Disease.    Anion gap 3 (L) 5 - 15    Comment:  Performed at [facility], [address] [place], [place] [address]  Magnesium     Status: None   Collection Time: 11/20/17  5:47 AM  Result Value Ref Range   Magnesium 1.9 1.7 - 2.4 mg/dL    Comment: Performed at [facility], [address] [place], [place] [address]  Phosphorus     Status: None   Collection Time: 11/20/17  5:47 AM  Result Value Ref Range   Phosphorus 3.1 2.5 - 4.6 mg/dL    Comment: Performed at [facility], [address] [place], [place] [address]  Pathologist smear review     Status: None   Collection Time: 11/20/17  5:47 AM  Result Value Ref Range   Path Review      Peripheral blood smear is reviewed at clinican request.    Comment: Patient admitted for altered mental status. Pancytopenia with inappropriately low reticulocytes for degree of anemia Normocytic anemia with rare nucleated RBCs.  Slight left shift in neutrophils. Normal platelet morphology. Findings are suggestive of bone marrow suppression or hypoproliferative state. Dr. Tasia Catchings is following the patient and additional studies are pending.  Reviewed by Dellia Nims Reuel Derby, M.D. Performed at [facility], [address] [place], [place] [address]   Glucose, capillary     Status: Abnormal   Collection Time: 11/20/17 11:56 AM  Result Value Ref Range   Glucose-Capillary 134 (H) 70 - 99 mg/dL  Glucose, capillary     Status: Abnormal   Collection Time: 11/20/17  6:04 PM  Result Value Ref Range   Glucose-Capillary 118 (H) 70 - 99 mg/dL  Glucose, capillary     Status: Abnormal   Collection Time: 11/21/17 12:24 AM  Result Value Ref Range   Glucose-Capillary 137 (H) 70 - 99 mg/dL  Basic metabolic panel     Status: Abnormal   Collection Time: 11/21/17  4:40 AM  Result Value Ref Range   Sodium 140 135 - 145 mmol/L   Potassium  4.0 3.5 - 5.1 mmol/L   Chloride 107 98 - 111 mmol/L   CO2 27 22 - 32 mmol/L   Glucose, Bld 131 (H) 70 - 99 mg/dL   BUN 28 (H) 6 - 20 mg/dL   Creatinine, Ser 1.10 0.61 - 1.24 mg/dL   Calcium 9.0 8.9 - 10.3 mg/dL   GFR calc non Af Amer >60 >60 mL/min   GFR calc Af Amer >60 >60 mL/min    Comment: (NOTE) The eGFR has been calculated using the CKD EPI equation. This calculation has not been validated in all clinical situations. eGFR's persistently <60 mL/min signify possible Chronic Kidney Disease.    Anion gap 6 5 - 15    Comment: Performed at [facility], [address] [place], [place] [address]  Magnesium     Status: None   Collection Time: 11/21/17  4:40 AM  Result Value Ref Range   Magnesium 1.8 1.7 - 2.4 mg/dL    Comment: Performed at [facility], [address] [place], [place] [address]  Phosphorus     Status: None   Collection Time: 11/21/17  4:40 AM  Result Value Ref Range   Phosphorus 3.2 2.5 - 4.6 mg/dL    Comment: Performed at [facility], [address] [place], [place] [address]  CBC     Status: Abnormal   Collection Time: 11/21/17  4:40 AM  Result Value Ref Range   WBC 2.7 (L) 3.8 - 10.6 K/uL  RBC 2.84 (L) 4.40 - 5.90 MIL/uL   Hemoglobin 9.5 (L) 13.0 - 18.0 g/dL   HCT 27.3 (L) 40.0 - 52.0 %   MCV 96.4 80.0 - 100.0 fL   MCH 33.4 26.0 - 34.0 pg   MCHC 34.6 32.0 - 36.0 g/dL   RDW 15.8 (H) 11.5 - 14.5 %   Platelets 42 (L) 150 - 440 K/uL    Comment: Performed at [facility], [address] [place], [place] [address]  ABO/Rh     Status: None   Collection Time: 11/21/17  4:40 AM  Result Value Ref Range   ABO/RH(D)      B POS Performed at [facility], [address] [place], [place] [address]   Glucose, capillary     Status: Abnormal   Collection Time: 11/21/17  4:41 AM  Result Value Ref Range   Glucose-Capillary 116 (H) 70 - 99 mg/dL   Comment 1 Notify RN   Prepare Pheresed Platelets     Status: None  (Preliminary result)   Collection Time: 11/21/17 11:34 AM  Result Value Ref Range   Unit Number [id number]    Blood Component Type PLTPH LI2 PAS    Unit division 00    Status of Unit ISSUED    Transfusion Status OK TO TRANSFUSE    Unit Number [id number]    Blood Component Type PLTPH LI2 PAS    Unit division 00    Status of Unit ISSUED    Transfusion Status      OK TO TRANSFUSE Performed at [facility], [address] [place], [place] [address]   Protime-INR     Status: None   Collection Time: 11/21/17 11:49 AM  Result Value Ref Range   Prothrombin Time 14.1 11.4 - 15.2 seconds   INR 1.10     Comment: Performed at [facility], [address] [place], [place] [address]  Glucose, capillary     Status: Abnormal   Collection Time: 11/21/17 11:53 AM  Result Value Ref Range   Glucose-Capillary 110 (H) 70 - 99 mg/dL   Comment 1 Notify RN   Glucose, capillary     Status: None   Collection Time: 11/21/17  5:50 PM  Result Value Ref Range   Glucose-Capillary 96 70 - 99 mg/dL   Comment 1 Notify RN     Current Facility-Administered Medications  Medication Dose Route Frequency Provider Last Rate Last Dose  . acetaminophen (TYLENOL) tablet 650 mg  650 mg Oral Q6H PRN Amelia Jo, MD       Or  . acetaminophen (TYLENOL) suppository 650 mg  650 mg Rectal Q6H PRN Amelia Jo, MD      . bisacodyl (DULCOLAX) EC tablet 5 mg  5 mg Oral Daily PRN Amelia Jo, MD   5 mg at 11/17/17 0954  . chlorhexidine (PERIDEX) 0.12 % solution 15 mL  15 mL Mouth Rinse BID Salary, Montell D, MD      . cholecalciferol (VITAMIN D) tablet 5,000 Units  5,000 Units Oral Daily Amelia Jo, MD   5,000 Units at 11/20/17 1105  . docusate sodium (COLACE) capsule 100 mg  100 mg Oral BID Amelia Jo, MD   100 mg at 11/17/17 0751  . TPN (CLINIMIX-E) Adult   Intravenous Continuous TPN Salary, [name], MD 83 mL/hr at 11/21/17 1834     And  . Fat emulsion 20 % infusion 250 mL  250 mL  Intravenous Continuous TPN Salary, [name], MD      .  guaiFENesin (MUCINEX) 12 hr tablet 600 mg  600 mg Oral BID Salary, Montell D, MD   600 mg at 11/20/17 1346  . haloperidol lactate (HALDOL) injection 2 mg  2 mg Intramuscular Q8H PRN Salary, Montell D, MD      . insulin aspart (novoLOG) injection 0-9 Units  0-9 Units Subcutaneous Q6H Salary, Montell D, MD   1 Units at 11/21/17 0100  . ipratropium-albuterol (DUONEB) 0.5-2.5 (3) MG/3ML nebulizer solution 3 mL  3 mL Nebulization Q4H PRN Salary, Montell D, MD   3 mL at 11/20/17 1614  . MEDLINE mouth rinse  15 mL Mouth Rinse q12n4p Salary, Montell D, MD      . midodrine (PROAMATINE) tablet 5 mg  5 mg Oral TID WC Dustin Flock, MD   5 mg at 11/21/17 0826  . nicotine (NICODERM CQ - dosed in mg/24 hours) patch 14 mg  14 mg Transdermal Daily Salary, Montell D, MD   14 mg at 11/21/17 1200  . ondansetron (ZOFRAN) tablet 4 mg  4 mg Oral Q6H PRN Amelia Jo, MD       Or  . ondansetron [facility] (1-Rh)) injection 4 mg  4 mg Intravenous Q6H PRN Amelia Jo, MD      . phenol (CHLORASEPTIC) mouth spray 2 spray  2 spray Mouth/Throat PRN Epifanio Lesches, MD      . piperacillin-tazobactam (ZOSYN) IVPB 3.375 g  3.375 g Intravenous Q8H Salary, [name], MD   Stopped at 11/21/17 1807  . polyethylene glycol (MIRALAX / GLYCOLAX) packet 17 g  17 g Oral Darl Householder, MD   17 g at 11/17/17 0751  . sodium chloride flush (NS) 0.9 % injection 10-40 mL  10-40 mL Intracatheter Q12H Alexis Goodell, MD   10 mL at 11/21/17 1203  . sodium chloride flush (NS) 0.9 % injection 10-40 mL  10-40 mL Intracatheter PRN Alexis Goodell, MD        Musculoskeletal: Strength & Muscle Tone: decreased Gait & Station: unsteady Patient leans: N/A  Psychiatric Specialty Exam: Physical Exam  Nursing note and vitals reviewed. Constitutional: He appears well-developed and well-nourished.  HENT:  Head: Normocephalic and atraumatic.  Eyes: Pupils are equal, round, and reactive to  light. Conjunctivae are normal.  Neck: Normal range of motion.  Cardiovascular: Regular rhythm and normal heart sounds.  Respiratory: Effort normal. No respiratory distress.  GI: Soft.  Musculoskeletal: Normal range of motion.  Neurological: He is alert.  Skin: Skin is warm and dry.  Psychiatric: His affect is blunt. His speech is delayed. He is slowed.    Review of Systems  Unable to perform ROS: Psychiatric disorder    Blood pressure 105/65, pulse 65, temperature (!) 97.4 F (36.3 C), temperature source Oral, resp. rate 18, height '6\' 1"'  (1.854 m), weight 75.5 kg, SpO2 98 %.Body mass index is 21.96 kg/m.  General Appearance: Disheveled  Eye Contact:  Fair  Speech:  Garbled and Slurred  Volume:  Decreased  Mood:  Euthymic  Affect:  Flat  Thought Process:  Disorganized  Orientation:  Full (Time, Place, and Person)  Thought Content:  Illogical  Suicidal Thoughts:  No  Homicidal Thoughts:  No  Memory:  Immediate;   Poor Recent;   Poor Remote;   Poor  Judgement:  Poor  Insight:  Lacking  Psychomotor Activity:  Decreased  Concentration:  Concentration: Poor  Recall:  Poor  Fund of Knowledge:  Poor  Language:  Poor  Akathisia:  No  Handed:  Right  AIMS (if indicated):  Assets:  Desire for Improvement  ADL's:  Impaired  Cognition:  Impaired,  Moderate and Severe  Sleep:        Treatment Plan Summary: Plan Patient is currently stabilizing off of all of his psychiatric medicine.  I am not going to restart anything at this point.  We will continue to monitor and see how he does hoping that he will get back to a more functional baseline.  Disposition: No evidence of imminent risk to self or others at present.    Alethia Berthold, MD 11/21/2017 6:34 PM

## 2017-11-21 NOTE — Progress Notes (Signed)
PT Cancellation Note  Patient Details Name: Austin Gates MRN: [id number] DOB: October 13, 1960   Cancelled Treatment:    Reason Eval/Treat Not Completed: Medical issues which prohibited therapy;Fatigue/lethargy limiting ability to participate;Other (comment). Treatment attempted several times, as other staff members in room with pt/family. Last attempt, pt in fetal position in bed, not responding to voice/offering PT, awake; however, opening and closing eyes only in response. Per chart review, pt weakened with difficulty swallowing and awaiting possible PEG tube placement. Cx/hold for today and re attempt at a later date.    Larae Grooms, PTA 11/21/2017, 3:50 PM

## 2017-11-21 NOTE — Progress Notes (Signed)
Daily Progress Note   Patient Name: Austin Gates       Date: 11/21/2017 DOB: [birth year]  Age: 57 y.o. MRN#: [id number] Attending Physician: Austin Harms, MD Primary Care Physician: Austin Number, MD Admit Date: 11/15/2017  Reason for Consultation/Follow-up: Establishing goals of care  Subjective: Patient lying in bed. Awake and A&O x3. Denies pain. Continues on TPN. According to SLP patient likely had episode of aspiration on yesterday evening, and now with coarse lung sounds. His diet was discontinued to prevent further complications.   I spoke with sister/legal guardian, Austin Gates. She was somewhat upset and states she is battling with decision making at this time. Offered her to elaborate more on her statement and hopefully if she had questions I could attempt to answer or contact Primary Provider (Austin Gates). Mrs. Austin Gates stated that she is not sure she would want her brother to proceed with a PEG tube placement. She reports after previous conversations today she is aware that he could still potentially aspirate, develop an infection, have complications such as GERD, potentially dislodge feeding tube, feeding tube could potentially become clogged and have to be de-clogged or replaced. She reports she would like to have a conversation with her sisters and his caregiver at the home before making a final decision. She states that if he is going to pass away she would rather him pass away naturally and enjoying the foods he like with understanding of potential complications versus having artificial feedings, undergo a procedure, and still continue to not do well. Support was given and I addressed each individual concern she had in regards to the placement of PEG tube. She also reports a family member  was also researching online and was going to call he back.   We discussed that if she chose not to proceed with the PEG this could cause harmful complications and even death due to aspiration. She verbalized understanding and again expressed why it is such a hard decision. I also attempted to re-discuss his current FULL code status given his current illness and situation. Sister continues to request patient remain a full code. I advised her to strongly discuss this as well when she is discussing the PEG tube with her family, due to the fact if she chooses to not proceed and request patient to be allowed food knowing  the high risk of aspiration, this would be more of a comfort approach and medically unsafe. The likely hood of an emergent respiratory event would be high with very low survival. She verbalized understanding. I offered her to have family come in and we could all have a goals of care discussion, however, she declined and stated would it be ok if they had questions or needed clarification if they just reached out by phone. I advised this would be fine.   Sister has requested to meet tomorrow morning at 0930 for follow-up.   Chart Reviewed.   Length of Stay: 6  Current Medications: Scheduled Meds:  . sodium chloride   Intravenous Once  . acetaminophen  650 mg Rectal Once  . chlorhexidine  15 mL Mouth Rinse BID  . cholecalciferol  5,000 Units Oral Daily  . diphenhydrAMINE  12.5 mg Intravenous Once  . docusate sodium  100 mg Oral BID  . guaiFENesin  600 mg Oral BID  . insulin aspart  0-9 Units Subcutaneous Q6H  . mouth rinse  15 mL Mouth Rinse q12n4p  . midodrine  5 mg Oral TID WC  . nicotine  14 mg Transdermal Daily  . polyethylene glycol  17 g Oral QODAY  . sodium chloride flush  10-40 mL Intracatheter Q12H    Continuous Infusions: . Marland KitchenTPN (CLINIMIX-E) Adult     And  . Fat emulsion    . piperacillin-tazobactam (ZOSYN)  IV 3.375 g (11/21/17 1202)  . Marland KitchenTPN (CLINIMIX-E) Adult 83  mL/hr at 11/21/17 1201    PRN Meds: acetaminophen **OR** acetaminophen, bisacodyl, haloperidol lactate, ipratropium-albuterol, ondansetron **OR** ondansetron (ZOFRAN) IV, phenol, sodium chloride flush  Physical Exam  Constitutional: He is oriented to person, place, and time. Vital signs are normal. He is cooperative. He appears ill.  Frail in appearance   Cardiovascular: Normal rate, regular rhythm, normal heart sounds and normal pulses.  Pulmonary/Chest: Effort normal. He has decreased breath sounds. He has rhonchi.  Abdominal: Normal appearance and bowel sounds are normal.  Neurological: He is alert and oriented to person, place, and time.  Psychological disorders/mental retardation   Skin: Skin is warm, dry and intact.           Vital Signs: BP 107/67   Pulse 85   Temp 97.8 F (36.6 C) (Axillary)   Resp (!) 24   Ht 6\' 1"  (1.854 m)   Wt 75.5 kg   SpO2 96%   BMI 21.96 kg/m  SpO2: SpO2: 96 % O2 Device: O2 Device: Nasal Cannula O2 Flow Rate: O2 Flow Rate (L/min): 2 L/min  Intake/output summary:   Intake/Output Summary (Last 24 hours) at 11/21/2017 1223 Last data filed at 11/21/2017 1206 Gross per 24 hour  Intake 1680.6 ml  Output 1950 ml  Net -269.4 ml   LBM: Last BM Date: 11/20/17 Baseline Weight: Weight: 65.8 kg Most recent weight: Weight: 75.5 kg      Palliative Assessment/Data: PPS 30%, NPO due to aspiration    Patient Active Problem List   Diagnosis Date Noted  . Protein-calorie malnutrition, severe 11/20/2017  . Aspiration pneumonia of both lungs ([place])   . Hypotension   . Pancytopenia ([place])   . ARF (acute renal failure) ([place]) 11/15/2017  . Elevated lithium level 11/06/2017  . Fall 11/06/2017  . Schizoaffective disorder, bipolar type ([place]) 10/04/2016  . Tobacco use disorder 09/30/2016  . 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 male admitted  on 11/15/2017 from [facility]. He presented to the ED with complaints of increased confusion, generalized weakness, and low blood pressure. He has a past medical history significant of mental retardation, schizoaffective disorder, tobacco use, and bipolar disorder.  Patient currently lives at the group home and was unable to provide history due to mental status.  During his ED course it was reported that most of the patient's symptoms have been going on for 5 to 7 days prior to admission and gradually worsening.  No reports of fever, chills, nausea, vomiting, diarrhea, or bleeding.  Facility worker also reports patient has experienced some weight loss in the past months despite having a good appetite and also noted that patient was started on a new medication for nocturnal enuresis (desmopressin).  They also reported that patient's lithium has been discontinued 2 weeks prior.  Patient was given a total of 4 L of fluid due to hypotension with systolic blood pressure in the 70s.  Sodium 148.  Creatinine 1.57.  BUN 44.  Platelet 96.  UA negative for UTI.  Chest x-ray is negative for acute cardiopulmonary disease.  Abdominal x-ray showed large stool burden.  Admission patient has been seen by neurology and psychiatry.  MRI of the brain noted.  Bilateral small hygromas with no needed intervention, EEG noted for slowing/no seizure activity, CT head noted for sinus disease, psychotropic meds were discontinued.  Patient also seen by speech and dietitian.  Patient was started on TPN, and after increasing alertness has now been placed on pured diet with nectar thickened liquids.  Palliative medicine team consulted for goals of care discussion.  Recommendations/Plan:  FULL CODE-family in discussion  Continue to treat the treatable including aggressive measures. Sister/Legal Guardian is in discussion with family regarding decision to proceed with PEG tube placement r/t patient aspirating and unsafe to continue with po intake.  Discussed in details complications and risk of continuing to aspirate and also for family to strongly consider code status in regards to severity of aspiration risk.   Sister request to meet at 930 on 10/2 to follow-up.   PMT will continue to follow.   Goals of Care and Additional Recommendations:  Limitations on Scope of Treatment: Full Scope Treatment  Code Status:    Code Status Orders  (From admission, onward)         Start     Ordered   11/16/17 0019  Full code  Continuous     11/16/17 0018        Code Status History    Date Active Date Inactive Code Status Order ID Comments User Context   09/29/2016 2043 10/07/2016 1514 Full Code [id number]  Gonzella Lex, MD Inpatient   09/17/2016 1901 09/18/2016 1556 Full Code [id number]  Demetrios Loll, MD Inpatient      Prognosis:   Unable to determine  Discharge Planning:  To Be Determined  Care plan was discussed with patient's guardian and SLP.   Thank you for allowing the Palliative Medicine Team to assist in the care of this patient.   Total Time 45 min.  Prolonged Time Billed  NO        Greater than 50%  of this time was spent counseling and coordinating care related to the above assessment and plan.  Alda Lea, AGPCNP-BC Palliative Medicine Team  Pager: [phone] Amion: Bjorn Pippin   Please contact Palliative Medicine Team phone at [phone] for questions and concerns.

## 2017-11-21 NOTE — Progress Notes (Signed)
PHARMACY - ADULT TOTAL PARENTERAL NUTRITION CONSULT NOTE   Pharmacy Consult for TPN Indication: malnutrition  Patient Measurements: Height: 6\' 1"  (185.4 cm) Weight: 166 lb 7.2 oz (75.5 kg) IBW/kg (Calculated) : 79.9 TPN AdjBW (KG): 65.8 Body mass index is 21.96 kg/m.  Assessment: 57 yom cc leg pain. Patient's family is concerned for swallowing problems and malnutrition. He had some significant episodes of aspiration while trying to eat yesterday. Dr Jerelyn Charles is planning to put in a PEG tube today or tomorrow but the patient will continue to need the TPN for at least another 48 hours.  GI: Dysphagia and aspiration while attemting to eat Endo: Patient has a documented history of diabetes but doesn't have any anti-hyperglycemics recorded on his medication list.  Insulin requirements in the past 24 hours: 2 units SSI Lytes: Mg: 1.8, Phos: 3.2, K: 4.0  Will follow for possibility of refeeding per TPN protocol Renal: SCr 1.10 mg/dL, CrCl 81 mL/min Pulm:  Cards:  Hepatobil: Neuro: Patient is alert but not talking or eating. EEG with slowing, Neurology consulted and suspects some degree of metabolic encephalopathy due to psychiatric medications - psych is following. Patient has a history of schizoaffective disorder and bipolar disorder for which he takes clozapine and Depakote, both of which have been discontinued for pancytopenia ID: Zosyn for aspiration PNA  TPN Access: PICC placement 9/28 TPN start date:  11/18/17 Nutritional Goals (per RD recommendation on 11/19/17): 2233 kcal, 100 grams of protein, 2232 mL fluid daily including lipids.  Current Nutrition: TPN  Plan:  Continue goal TPN regimen of Clinimix E 5/20 at 83 mL/hr + 20% ILE at 20 mL/hr over 12 hours.  Add MVI, trace elements, and thiamine 100mg  daily x 3 days (9/29-10/1)  Sensitive SSI ordered., adjust as needed  Monitor TPN labs, per protocol   Austin Gates, Pharm.D Clinical Pharmacist 11/21/2017,10:15 AM

## 2017-11-21 NOTE — Progress Notes (Signed)
Subjective: Patient attempted to eat yesterday but per sister was not able to manage swallowing.  Today has not wanted to eat much.  Is talking more and moving his lower extremities more per sister.    Objective: Current vital signs: BP 107/68 (BP Location: Left Arm)   Pulse 65   Temp 98.3 F (36.8 C) (Oral)   Resp 16   Ht 6\' 1"  (1.854 m)   Wt 75.5 kg   SpO2 96%   BMI 21.96 kg/m  Vital signs in last 24 hours: Temp:  [97.8 F (36.6 C)-98.3 F (36.8 C)] 98.3 F (36.8 C) (10/01 1301) Pulse Rate:  [65-89] 65 (10/01 1301) Resp:  [16-24] 16 (10/01 1301) BP: (107-122)/(67-78) 107/68 (10/01 1301) SpO2:  [94 %-100 %] 96 % (10/01 1301) Weight:  [75.5 kg] 75.5 kg (10/01 0300)  Intake/Output from previous day: 09/30 0701 - 10/01 0700 In: 1098.3 [I.V.:1048.2; IV Piggyback:50.1] Out: 1000 [Urine:1000] Intake/Output this shift: Total I/O In: 582.3 [I.V.:582.3] Out: 950 [Urine:950] Nutritional status:  Diet Order            Diet NPO time specified Except for: Sips with Meds  Diet effective now              Neurologic Exam: Mental Status: Lethargic but easily arousable.  Speaks little.   Cranial Nerves: II: Discs flat bilaterally; Blinks to bilateral confrontation, pupils equal, round, reactive to light and accommodation III,IV, VI: ptosis not present, extra-ocular motions intact bilaterally V,VII: smile symmetric, facial light touch sensation normal bilaterally VIII: hearing normal bilaterally IX,X: gag reflex present XI: bilateral shoulder shrug XII: midline tongue extension Motor: Moves upper extremities spontaneously.   Sensory: Responds to noxious stimuli in all extremities   Lab Results: Basic Metabolic Panel: Recent Labs  Lab 11/17/17 1059 11/18/17 0709 11/19/17 0432 11/20/17 0547 11/21/17 0440  NA 146* 146* 144 139 140  K 4.1 4.6 4.3 3.9 4.0  CL 114* 115* 112* 107 107  CO2 27 28 28 29 27   GLUCOSE 154* 84 118* 116* 131*  BUN 27* 24* 25* 30* 28*   CREATININE 1.09 1.08 1.17 0.98 1.10  CALCIUM 8.9 9.3 9.0 9.0 9.0  MG  --  1.9 1.6* 1.9 1.8  PHOS  --  2.8 3.3 3.1 3.2    Liver Function Tests: Recent Labs  Lab 11/17/17 1059 11/18/17 0709 11/19/17 0432  AST 39 33 36  ALT 24 24 29   ALKPHOS 62 61 65  BILITOT 0.5 0.6 0.6  PROT 5.5* 5.2* 5.2*  ALBUMIN 2.6* 2.5* 2.4*   No results for input(s): LIPASE, AMYLASE in the last 168 hours. Recent Labs  Lab 11/17/17 1059  AMMONIA 24    CBC: Recent Labs  Lab 11/16/17 0414 11/16/17 1031 11/17/17 0436 11/18/17 0709 11/19/17 0432 11/20/17 0547 11/21/17 5009  WBC 5.8 DUPLICATE REQUEST 3.1* 2.6* 3.3* 2.5* 2.7*  NEUTROABS 5.2 PENDING  --  2.2 3.0 1.8  --   HGB 38.1* DUPLICATE REQUEST 82.9* 11.0* 11.0* 10.2* 9.5*  HCT 93.7* DUPLICATE REQUEST 16.9* 31.8* 31.6* 29.0* 67.8*  MCV 93.8 DUPLICATE REQUEST 10.1 75.1 94.1 93.7 02.5  PLT 76* DUPLICATE REQUEST 70* 63* 47* 38* 42*    Cardiac Enzymes: Recent Labs  Lab 11/15/17 1622  TROPONINI <0.03    Lipid Panel: Recent Labs  Lab 11/19/17 0432 11/20/17 0547  TRIG 79 62    CBG: Recent Labs  Lab 11/20/17 1156 11/20/17 1804 11/21/17 0024 11/21/17 0441 11/21/17 1153  GLUCAP 134* 118* 137* 116* 110*  Microbiology: Results for orders placed or performed during the hospital encounter of 11/15/17  CULTURE, BLOOD (ROUTINE X 2) w Reflex to ID Panel     Status: None (Preliminary result)   Collection Time: 11/18/17  7:15 AM  Result Value Ref Range Status   Specimen Description BLOOD LEFT [facility]  Final   Special Requests   Final    BOTTLES DRAWN AEROBIC AND ANAEROBIC Blood Culture adequate volume   Culture   Final    NO GROWTH 3 DAYS Performed at [facility], [address] [place], [place] [address]    Report Status PENDING  Incomplete  CULTURE, BLOOD (ROUTINE X 2) w Reflex to ID Panel     Status: None (Preliminary result)   Collection Time: 11/18/17  7:21 AM  Result Value Ref Range Status   Specimen Description  BLOOD LEFT WRIST  Final   Special Requests   Final    BOTTLES DRAWN AEROBIC AND ANAEROBIC Blood Culture adequate volume   Culture   Final    NO GROWTH 3 DAYS Performed at [facility], [address], [place], [place] [address]    Report Status PENDING  Incomplete    Coagulation Studies: Recent Labs    11/21/17 1149  LABPROT 14.1  INR 1.10    Imaging: No results found.  Medications:  I have reviewed the patient's current medications. Scheduled: . chlorhexidine  15 mL Mouth Rinse BID  . cholecalciferol  5,000 Units Oral Daily  . docusate sodium  100 mg Oral BID  . guaiFENesin  600 mg Oral BID  . insulin aspart  0-9 Units Subcutaneous Q6H  . mouth rinse  15 mL Mouth Rinse q12n4p  . midodrine  5 mg Oral TID WC  . nicotine  14 mg Transdermal Daily  . polyethylene glycol  17 g Oral QODAY  . sodium chloride flush  10-40 mL Intracatheter Q12H    Assessment/Plan: Patient continues to have difficulty managing swallowing but seems to have more of an urge to eat and speak.  Will consider PEG placement.  Therapy continues to follow patient.     LOS: 6 days   Alexis Goodell, MD Neurology [phone] 11/21/2017  2:53 PM

## 2017-11-21 NOTE — Clinical Social Work Note (Signed)
No bed offers at this time. Physician states patient for PEG today or tomorrow due to aspiration. Shela Leff MSW,LCSW [phone]

## 2017-11-21 NOTE — Progress Notes (Signed)
[facility] NAME: Austin Gates    MR#:  [id number]  DATE OF BIRTH:  May 14, 1960  SUBJECTIVE:  CHIEF COMPLAINT:   Chief Complaint  Patient presents with  . Leg Pain  Patient had episode of emesis/choking yesterday and discussion with nursing staff, sister is at the bedside whom witnessed the event, patient unable to tolerate p.o. well enough to maintain body weight-we will plan for PEG tube placement, all questions answered, case discussed with speech therapy REVIEW OF SYSTEMS:  CONSTITUTIONAL: No fever, fatigue or weakness.  EYES: No blurred or double vision.  EARS, NOSE, AND THROAT: No tinnitus or ear pain.  RESPIRATORY: No cough, shortness of breath, wheezing or hemoptysis.  CARDIOVASCULAR: No chest pain, orthopnea, edema.  GASTROINTESTINAL: No nausea, vomiting, diarrhea or abdominal pain.  GENITOURINARY: No dysuria, hematuria.  ENDOCRINE: No polyuria, nocturia,  HEMATOLOGY: No anemia, easy bruising or bleeding SKIN: No rash or lesion. MUSCULOSKELETAL: No joint pain or arthritis.   NEUROLOGIC: No tingling, numbness, weakness.  PSYCHIATRY: No anxiety or depression.   ROS  DRUG ALLERGIES:  No Known Allergies  VITALS:  Blood pressure 107/67, pulse 85, temperature 97.8 F (36.6 C), temperature source Axillary, resp. rate (!) 24, height 6\' 1"  (1.854 m), weight 75.5 kg, SpO2 96 %.  PHYSICAL EXAMINATION:  GENERAL:  57 y.o.-year-old patient lying in the bed with no acute distress.  EYES: Pupils equal, round, reactive to light and accommodation. No scleral icterus. Extraocular muscles intact.  HEENT: Head atraumatic, normocephalic. Oropharynx and nasopharynx clear.  NECK:  Supple, no jugular venous distention. No thyroid enlargement, no tenderness.  LUNGS: Normal breath sounds bilaterally, no wheezing, rales,rhonchi or crepitation. No use of accessory muscles of respiration.  CARDIOVASCULAR: S1, S2 normal. No murmurs, rubs, or  gallops.  ABDOMEN: Soft, nontender, nondistended. Bowel sounds present. No organomegaly or mass.  EXTREMITIES: No pedal edema, cyanosis, or clubbing.  NEUROLOGIC: Cranial nerves II through XII are intact. Muscle strength 5/5 in all extremities. Sensation intact. Gait not checked.  PSYCHIATRIC: The patient is alert and oriented x 3.  SKIN: No obvious rash, lesion, or ulcer.   Physical Exam LABORATORY PANEL:   CBC Recent Labs  Lab 11/21/17 0440  WBC 2.7*  HGB 9.5*  HCT 27.3*  PLT 42*   ------------------------------------------------------------------------------------------------------------------  Chemistries  Recent Labs  Lab 11/19/17 0432  11/21/17 0440  NA 144   < > 140  K 4.3   < > 4.0  CL 112*   < > 107  CO2 28   < > 27  GLUCOSE 118*   < > 131*  BUN 25*   < > 28*  CREATININE 1.17   < > 1.10  CALCIUM 9.0   < > 9.0  MG 1.6*   < > 1.8  AST 36  --   --   ALT 29  --   --   ALKPHOS 65  --   --   BILITOT 0.6  --   --    < > = values in this interval not displayed.   ------------------------------------------------------------------------------------------------------------------  Cardiac Enzymes Recent Labs  Lab 11/15/17 1622  TROPONINI <0.03   ------------------------------------------------------------------------------------------------------------------  RADIOLOGY:  Ct Chest Wo Contrast  Result Date: 11/19/2017 CLINICAL DATA:  Lethargic, abnormal chest x-ray.  Pancytopenia. EXAM: CT CHEST WITHOUT CONTRAST TECHNIQUE: Multidetector CT imaging of the chest was performed following the standard protocol without IV contrast. COMPARISON:  None. FINDINGS: Cardiovascular: No significant vascular findings. Normal heart size. No  pericardial effusion. Thoracic aortic atherosclerosis. Mediastinum/Nodes: No enlarged mediastinal or axillary lymph nodes. Thyroid gland, trachea, and esophagus demonstrate no significant findings. Lungs/Pleura: Bilateral small pleural effusions.  Bilateral lower lobe airspace disease likely reflecting compressive atelectasis and/or pneumonia. Patchy areas of airspace disease in bilateral upper lobes and right middle lobe concerning for multilobar pneumonia. No pneumothorax. Upper Abdomen: No acute abnormality. Musculoskeletal: No acute osseous abnormality. No aggressive osseous lesion. IMPRESSION: 1. Findings concerning for multilobar pneumonia. Small bilateral pleural effusions. Electronically Signed   By: Kathreen Devoid   On: 11/19/2017 13:47   Mr Brain Wo Contrast  Result Date: 11/19/2017 CLINICAL DATA:  Altered mental status, unclear cause, nonambulatory. Ataxia with head trauma was given as the history on 11/06/17. EXAM: MRI HEAD WITHOUT CONTRAST TECHNIQUE: Multiplanar, multiecho pulse sequences of the brain and surrounding structures were obtained without intravenous contrast. COMPARISON:  Prior CT head 11/06/2017.  Also CT head 11/17/2017. FINDINGS: Brain: No acute stroke, acute hemorrhage, mass lesion, or hydrocephalus. Normal for age cerebral volume. No significant white matter disease. No parenchymal hemorrhage. BILATERAL extra-axial cerebral and cerebellar CSF fluid collections are seen, primarily over the bifrontal convexity, reflecting subdural hygromas. These measure 5 mm thick on the RIGHT and LEFT over the frontal lobes. No blood products to suggest hematoma. The hygromas have developed since the initial CT of 11/06/2017, and are similar in appearance to 11/17/2017 CT. Vascular: Normal flow voids. Skull and upper cervical spine: No visible skull fracture. Incidental arachnoid granulation projects into the LEFT paramedian frontal bone. Sinuses/Orbits: Unremarkable. Other: None.  These measure IMPRESSION: BILATERAL 5 mm thick subdural hygromas, predominantly supratentorial. No blood products to suggest hematoma. These collections have developed since the previous CT scan of 11/06/2017, but their relation to the patient's current symptoms is  unclear. A text page was placed to the ordering provider at 1:15 p.m. Electronically Signed   By: Staci Righter M.D.   On: 11/19/2017 13:16   US Abdomen Complete  Result Date: 11/19/2017 CLINICAL DATA:  Pancytopenia EXAM: ABDOMEN ULTRASOUND COMPLETE COMPARISON:  None. FINDINGS: Gallbladder: No gallstones or wall thickening visualized. No sonographic Murphy sign noted by sonographer. Common bile duct: Diameter: Normal at 5 mm Liver: Normal liver parenchyma. Portal vein is patent on color Doppler imaging with normal direction of blood flow towards the liver. IVC: No abnormality visualized. Pancreas: Visualized portion unremarkable. Spleen: Size and appearance within normal limits. Right Kidney: Length: 9.9 cm.  There is Left Kidney: Length: 9.2 cm. Echogenicity within normal limits. No mass or hydronephrosis visualized. Abdominal aorta: No aneurysm visualized. Other findings: Large amount of bowel gas and stool limits exam. Small amount of ascites. IMPRESSION: 1. No acute abdominal findings. 2. Small volume ascites. 3. Limitations due to bowel gas and stool. Electronically Signed   By: Suzy Bouchard M.D.   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 to maintain body weight Speech therapy input appreciated-failed pured diet with nectar thickened liquids, continue TPN, consult IR for PEG tube placement, check PT/INR, platelets transfusion given thrombocytopenia prior to procedure   *Acute on  chronic  moderate to severe protein calorie/energy malnutrition Stable continue TPN, follow-up on CEA, AFP, CA 19-9, PEG tube placement as stated above  *Pancytopenia Stable Oncology input appreciated Most likely related to psychotropic medications Abdominal ultrasound unimpressive, oncology/hematology input appreciated, anemia work-up consistent with iron deficiency-treated with IV iron, follow-up on antiplatelet antibodies, CBC daily, transfuse platelets for thrombocytopenia given planned procedure tomorrow tube  *Acute kidney injury Resolved with IV fluids for rehydration  *Acute bilateral aspiration pneumonia, present on admission  Secondary to encephalopathy and dysphasia Chest x-rays were always inconclusive, CT of the chest noted for bilateral pneumonia Pneumonia protocol, empiric Zosyn for 5-day course, aspiration precautions  *Schizoaffective/bipolar disorder Psychiatry input appreciated  Psychotropic medications were discontinued which resulted in resolution of encephalopathy     *Tobacco smoking abuse/dependency  Stable Nicotine patch and cessation counseling ordered  *Acute hypomagnesemia Repleted  Disposition to skilled nursing facility after medically stable  All the records are reviewed and case discussed with Care Management/Social Workerr. Management plans discussed with the patient, family and they are in agreement.  CODE STATUS: full  TOTAL TIME TAKING CARE OF THIS PATIENT: 45 minutes.   POSSIBLE D/C IN 3 DAYS, DEPENDING ON CLINICAL CONDITION.   [name] Salary M.D on 11/21/2017   Between 7am to 6pm - Pager - [phone]  After 6pm go to [email or web address] - password [email or web address] [facility]  Office  [phone]  CC: Primary care physician; Raelyn Number, MD  Note: This dictation was prepared with Dragon dictation along with smaller phrase technology. Any transcriptional errors that result from this process are unintentional.

## 2017-11-21 NOTE — Progress Notes (Addendum)
SLP Cancellation Note  Patient Details Name: Austin Gates MRN: [id number] DOB: [birth year]   Cancelled treatment:       Reason Eval/Treat Not Completed: (chart reviewed; consulted MD re: pt's status). Had spoken w/ NSG yesterday post lunch meal who stated pt had eaten "well" at the lunch meal; Sister here today agreed as she was present. However, post meal, MD reported pt had an episode of emesis/choking and there was concern he had aspirated some of the Regurgitated food/liquid. Today, his pulmonary status appears min declined per MD. MD has discussed w/ the Sister and patient that pt appears unable to tolerate p.o. well enough to maintain body weight; also the dysphagia diet required at this time, so a plan for PEG tube placement was made. Noted Palliative Care to meet w/ the Sister as well today. Pt is NPO currently. ST services will hold on any trials w/ an oral diet at this time and can be available for any further education or assessment as needed while pt is admitted. MD/NSG to reconsult. Recommend frequent oral care for hygiene and stimulation of swallowing. Sister and NSG agreed.    Orinda Kenner, MS, CCC-SLP Adrionna Delcid 11/21/2017, 2:40 PM

## 2017-11-21 NOTE — OR Nursing (Signed)
Received update from floor RN.Dr. Golden Circle to speak with ordering physician [id number]. Dr. Golden Circle aware of platelet count of 42 and platelet infusion pending.

## 2017-11-22 ENCOUNTER — Inpatient Hospital Stay: Payer: Medicaid Other

## 2017-11-22 DIAGNOSIS — F17210 Nicotine dependence, cigarettes, uncomplicated: Secondary | ICD-10-CM

## 2017-11-22 DIAGNOSIS — D72810 Lymphocytopenia: Secondary | ICD-10-CM

## 2017-11-22 DIAGNOSIS — J690 Pneumonitis due to inhalation of food and vomit: Secondary | ICD-10-CM

## 2017-11-22 DIAGNOSIS — D696 Thrombocytopenia, unspecified: Secondary | ICD-10-CM

## 2017-11-22 DIAGNOSIS — Z6821 Body mass index (BMI) 21.0-21.9, adult: Secondary | ICD-10-CM

## 2017-11-22 DIAGNOSIS — R634 Abnormal weight loss: Secondary | ICD-10-CM

## 2017-11-22 LAB — BPAM PLATELET PHERESIS
BLOOD PRODUCT EXPIRATION DATE: 201910042359
Blood Product Expiration Date: 201910022359
ISSUE DATE / TIME: 201910011547
ISSUE DATE / TIME: 201910011820
UNIT TYPE AND RH: 7300
Unit Type and Rh: 5100

## 2017-11-22 LAB — GLUCOSE, CAPILLARY
GLUCOSE-CAPILLARY: 86 mg/dL (ref 70–99)
GLUCOSE-CAPILLARY: 97 mg/dL (ref 70–99)
Glucose-Capillary: 83 mg/dL (ref 70–99)
Glucose-Capillary: 94 mg/dL (ref 70–99)

## 2017-11-22 LAB — DIRECT PLATELET ANTIBODY
PLT ASSOC. ANTI-IA/IIA: POSITIVE — AB
PLT ASSOC. ANTI-IB/IX: POSITIVE — AB
Plt Assoc. Anti-IIB/IIIA: POSITIVE — AB

## 2017-11-22 LAB — CBC
HEMATOCRIT: 25.7 % — AB (ref 40.0–52.0)
HEMOGLOBIN: 9 g/dL — AB (ref 13.0–18.0)
MCH: 33.6 pg (ref 26.0–34.0)
MCHC: 34.9 g/dL (ref 32.0–36.0)
MCV: 96.3 fL (ref 80.0–100.0)
Platelets: 84 10*3/uL — ABNORMAL LOW (ref 150–440)
RBC: 2.67 MIL/uL — ABNORMAL LOW (ref 4.40–5.90)
RDW: 15.5 % — ABNORMAL HIGH (ref 11.5–14.5)
WBC: 3.6 10*3/uL — ABNORMAL LOW (ref 3.8–10.6)

## 2017-11-22 LAB — PREPARE PLATELET PHERESIS
UNIT DIVISION: 0
Unit division: 0

## 2017-11-22 MED ORDER — IOPAMIDOL (ISOVUE-300) INJECTION 61%
100.0000 mL | Freq: Once | INTRAVENOUS | Status: AC | PRN
  Administered 2017-11-22: 100 mL via INTRAVENOUS

## 2017-11-22 MED ORDER — TRACE MINERALS CR-CU-MN-SE-ZN 10-1000-500-60 MCG/ML IV SOLN
INTRAVENOUS | Status: AC
  Administered 2017-11-22: 18:00:00 via INTRAVENOUS
  Filled 2017-11-22: qty 1992

## 2017-11-22 MED ORDER — FAT EMULSION PLANT BASED 20 % IV EMUL
250.0000 mL | INTRAVENOUS | Status: AC
  Administered 2017-11-22: 250 mL via INTRAVENOUS
  Filled 2017-11-22: qty 250

## 2017-11-22 MED ORDER — SCOPOLAMINE 1 MG/3DAYS TD PT72
1.0000 | MEDICATED_PATCH | TRANSDERMAL | Status: DC
  Administered 2017-11-22: 1.5 mg via TRANSDERMAL
  Filled 2017-11-22: qty 1

## 2017-11-22 MED ORDER — SODIUM CHLORIDE 0.9% IV SOLUTION
Freq: Once | INTRAVENOUS | Status: AC
  Administered 2017-11-22: 12:00:00 via INTRAVENOUS

## 2017-11-22 MED ORDER — SODIUM CHLORIDE 0.9 % IV SOLN: INTRAVENOUS | Status: DC | PRN

## 2017-11-22 MED ORDER — SODIUM CHLORIDE 0.9% IV SOLUTION: Freq: Once | INTRAVENOUS | Status: DC

## 2017-11-22 MED ORDER — LORAZEPAM 2 MG/ML IJ SOLN
0.5000 mg | Freq: Once | INTRAMUSCULAR | Status: AC
  Administered 2017-11-22: 0.5 mg via INTRAVENOUS
  Filled 2017-11-22: qty 1

## 2017-11-22 NOTE — Consult Note (Signed)
[facility] Consult   Reason for Consult: Follow-up patient with schizoaffective disorder and intellectual disability Referring Physician: Posey Pronto Patient Identification: Austin Gates MRN:  [id number] Principal Diagnosis: Schizoaffective disorder, bipolar type [facility]) Diagnosis:   Patient Active Problem List   Diagnosis Date Noted  . Protein-calorie malnutrition, severe [E43] 11/20/2017  . Aspiration pneumonia of both lungs ([place]) [J69.0]   . Hypotension [I95.9]   . Pancytopenia ([place]) [D61.818]   . ARF (acute renal failure) ([place]) [N17.9] 11/15/2017  . Elevated lithium level [R79.89] 11/06/2017  . Fall [W19.XXXA] 11/06/2017  . Schizoaffective disorder, bipolar type ([place]) [F25.0] 10/04/2016  . Tobacco use disorder [F17.200] 09/30/2016  . 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 male patient admitted with "I am okay".  HPI: Patient seen for follow-up.  He has been taken off all his psychiatric medicine in an attempt to address the altered mental status and confusion that brought him into the hospital.  Found patient this afternoon awake alert able to make eye contact and have very simple conversation.  Answered questions in a couple of words.  Austin Gates he was okay.  Denied being in any pain.  Denied having any hallucinations.  Past Psychiatric History: Long-standing chronic severe mental health problems but recently they seem to a bit over taken by his medical problems and dementia  Risk to Self:   Risk to Others:   Prior Inpatient Therapy:   Prior Outpatient Therapy:    Past Medical History:  Past Medical History:  Diagnosis Date  . Diabetes mellitus without complication ([place])   . Hypertension     Past Surgical History:  Procedure Laterality Date  . KNEE ARTHROSCOPY     Family History:  Family History  Problem Relation Age of Onset  . Diabetes Mother     Family Psychiatric  History: See previous note Social History:  Social History   Substance and Sexual Activity  Alcohol Use No     Social History   Substance and Sexual Activity  Drug Use No    Social History   Socioeconomic History  . Marital status: Single    Spouse name: Not on file  . Number of children: Not on file  . Years of education: Not on file  . Highest education level: Not on file  Occupational History  . Not on file  Social Needs  . Financial resource strain: Not on file  . Food insecurity:    Worry: Not on file    Inability: Not on file  . Transportation needs:    Medical: Not on file    Non-medical: Not on file  Tobacco Use  . Smoking status: Current Every Day Smoker    Packs/day: 1.00    Types: Cigarettes  . Smokeless tobacco: Never Used  Substance and Sexual Activity  . Alcohol use: No  . Drug use: No  . Sexual activity: Never  Lifestyle  . Physical activity:    Days per week: Not on file    Minutes per session: Not on file  . Stress: Not on file  Relationships  . Social connections:    Talks on phone: Not on file    Gets together: Not on file    Attends religious service: Not on file    Active member of club or organization: Not on file    Attends meetings of clubs or organizations: Not on file  Relationship status: Not on file  Other Topics Concern  . Not on file  Social History Narrative  . Not on file   Additional Social History:    Allergies:  No Known Allergies  Labs:  Results for orders placed or performed during the hospital encounter of 11/15/17 (from the past 48 hour(s))  Glucose, capillary     Status: Abnormal   Collection Time: 11/20/17  6:04 PM  Result Value Ref Range   Glucose-Capillary 118 (H) 70 - 99 mg/dL  Glucose, capillary     Status: Abnormal   Collection Time: 11/21/17 12:24 AM  Result Value Ref Range   Glucose-Capillary 137 (H) 70 - 99 mg/dL  Basic metabolic panel     Status: Abnormal   Collection  Time: 11/21/17  4:40 AM  Result Value Ref Range   Sodium 140 135 - 145 mmol/L   Potassium 4.0 3.5 - 5.1 mmol/L   Chloride 107 98 - 111 mmol/L   CO2 27 22 - 32 mmol/L   Glucose, Bld 131 (H) 70 - 99 mg/dL   BUN 28 (H) 6 - 20 mg/dL   Creatinine, Ser 1.10 0.61 - 1.24 mg/dL   Calcium 9.0 8.9 - 10.3 mg/dL   GFR calc non Af Amer >60 >60 mL/min   GFR calc Af Amer >60 >60 mL/min    Comment: (NOTE) The eGFR has been calculated using the CKD EPI equation. This calculation has not been validated in all clinical situations. eGFR's persistently <60 mL/min signify possible Chronic Kidney Disease.    Anion gap 6 5 - 15    Comment: Performed at [facility], [address] [place], [place] [address]  Magnesium     Status: None   Collection Time: 11/21/17  4:40 AM  Result Value Ref Range   Magnesium 1.8 1.7 - 2.4 mg/dL    Comment: Performed at [facility], [address] [place], [place] [address]  Phosphorus     Status: None   Collection Time: 11/21/17  4:40 AM  Result Value Ref Range   Phosphorus 3.2 2.5 - 4.6 mg/dL    Comment: Performed at [facility], [address] [place], [place] [address]  CBC     Status: Abnormal   Collection Time: 11/21/17  4:40 AM  Result Value Ref Range   WBC 2.7 (L) 3.8 - 10.6 K/uL   RBC 2.84 (L) 4.40 - 5.90 MIL/uL   Hemoglobin 9.5 (L) 13.0 - 18.0 g/dL   HCT 27.3 (L) 40.0 - 52.0 %   MCV 96.4 80.0 - 100.0 fL   MCH 33.4 26.0 - 34.0 pg   MCHC 34.6 32.0 - 36.0 g/dL   RDW 15.8 (H) 11.5 - 14.5 %   Platelets 42 (L) 150 - 440 K/uL    Comment: Performed at [facility], [address] [place], [place] [address]  ABO/Rh     Status: None   Collection Time: 11/21/17  4:40 AM  Result Value Ref Range   ABO/RH(D)      B POS Performed at [facility], [address] [place], [place] [address]   Glucose, capillary     Status: Abnormal   Collection Time: 11/21/17  4:41 AM  Result Value Ref Range    Glucose-Capillary 116 (H) 70 - 99 mg/dL   Comment 1 Notify RN   Prepare Pheresed Platelets     Status: None   Collection Time: 11/21/17 11:34 AM  Result Value Ref Range   Unit  Number [id number]    Blood Component Type PLTPH LI2 PAS    Unit division 00    Status of Unit ISSUED,FINAL    Transfusion Status      OK TO TRANSFUSE Performed at [facility], [address] [place], [place] [address]    Unit Number [id number]    Blood Component Type PLTPH LI2 PAS    Unit division 00    Status of Unit ISSUED,FINAL    Transfusion Status OK TO TRANSFUSE   Protime-INR     Status: None   Collection Time: 11/21/17 11:49 AM  Result Value Ref Range   Prothrombin Time 14.1 11.4 - 15.2 seconds   INR 1.10     Comment: Performed at [facility], [address] [place], [place] [address]  Glucose, capillary     Status: Abnormal   Collection Time: 11/21/17 11:53 AM  Result Value Ref Range   Glucose-Capillary 110 (H) 70 - 99 mg/dL   Comment 1 Notify RN   Glucose, capillary     Status: None   Collection Time: 11/21/17  5:50 PM  Result Value Ref Range   Glucose-Capillary 96 70 - 99 mg/dL   Comment 1 Notify RN   Glucose, capillary     Status: None   Collection Time: 11/22/17 12:18 AM  Result Value Ref Range   Glucose-Capillary 83 70 - 99 mg/dL  CBC     Status: Abnormal   Collection Time: 11/22/17  4:30 AM  Result Value Ref Range   WBC 3.6 (L) 3.8 - 10.6 K/uL   RBC 2.67 (L) 4.40 - 5.90 MIL/uL   Hemoglobin 9.0 (L) 13.0 - 18.0 g/dL   HCT 25.7 (L) 40.0 - 52.0 %   MCV 96.3 80.0 - 100.0 fL   MCH 33.6 26.0 - 34.0 pg   MCHC 34.9 32.0 - 36.0 g/dL   RDW 15.5 (H) 11.5 - 14.5 %   Platelets 84 (L) 150 - 440 K/uL    Comment: Performed at [facility], [address] [place], [place] [address]  Glucose, capillary     Status: None   Collection Time: 11/22/17  7:02 AM  Result Value Ref Range   Glucose-Capillary 86 70 - 99 mg/dL  Glucose, capillary     Status: None    Collection Time: 11/22/17 12:03 PM  Result Value Ref Range   Glucose-Capillary 94 70 - 99 mg/dL   Comment 1 Notify RN     Current Facility-Administered Medications  Medication Dose Route Frequency Provider Last Rate Last Dose  . Marland KitchenTPN (CLINIMIX-E) Adult   Intravenous Continuous TPN Salary, [name], MD       And  . Fat emulsion 20 % infusion 250 mL  250 mL Intravenous Continuous TPN Salary, Montell D, MD      . acetaminophen (TYLENOL) tablet 650 mg  650 mg Oral Q6H PRN Amelia Jo, MD       Or  . acetaminophen (TYLENOL) suppository 650 mg  650 mg Rectal Q6H PRN Amelia Jo, MD      . bisacodyl (DULCOLAX) EC tablet 5 mg  5 mg Oral Daily PRN Amelia Jo, MD   5 mg at 11/17/17 0954  . chlorhexidine (PERIDEX) 0.12 % solution 15 mL  15 mL Mouth Rinse BID Salary, Montell D, MD   15 mL at 11/21/17 2200  . cholecalciferol (VITAMIN D) tablet 5,000 Units  5,000 Units Oral Daily Amelia Jo, MD   5,000 Units at 11/20/17 1105  . docusate  sodium (COLACE) capsule 100 mg  100 mg Oral BID Amelia Jo, MD   100 mg at 11/17/17 0751  . guaiFENesin (MUCINEX) 12 hr tablet 600 mg  600 mg Oral BID Salary, Montell D, MD   600 mg at 11/20/17 1346  . haloperidol lactate (HALDOL) injection 2 mg  2 mg Intramuscular Q8H PRN Salary, Montell D, MD      . insulin aspart (novoLOG) injection 0-9 Units  0-9 Units Subcutaneous Q6H Salary, Montell D, MD   1 Units at 11/21/17 0100  . ipratropium-albuterol (DUONEB) 0.5-2.5 (3) MG/3ML nebulizer solution 3 mL  3 mL Nebulization Q4H PRN Salary, Montell D, MD   3 mL at 11/20/17 1614  . MEDLINE mouth rinse  15 mL Mouth Rinse q12n4p Salary, Montell D, MD      . midodrine (PROAMATINE) tablet 5 mg  5 mg Oral TID WC Dustin Flock, MD   5 mg at 11/21/17 0826  . nicotine (NICODERM CQ - dosed in mg/24 hours) patch 14 mg  14 mg Transdermal Daily Salary, Montell D, MD   14 mg at 11/22/17 1212  . ondansetron (ZOFRAN) tablet 4 mg  4 mg Oral Q6H PRN Amelia Jo, MD       Or  .  ondansetron [facility]) injection 4 mg  4 mg Intravenous Q6H PRN Amelia Jo, MD      . phenol (CHLORASEPTIC) mouth spray 2 spray  2 spray Mouth/Throat PRN Epifanio Lesches, MD      . piperacillin-tazobactam (ZOSYN) IVPB 3.375 g  3.375 g Intravenous Q8H Salary, Montell D, MD 12.5 mL/hr at 11/22/17 1205 3.375 g at 11/22/17 1205  . polyethylene glycol (MIRALAX / GLYCOLAX) packet 17 g  17 g Oral Darl Householder, MD   17 g at 11/17/17 0751  . scopolamine (TRANSDERM-SCOP) 1 MG/3DAYS 1.5 mg  1 patch Transdermal Q72H Salary, Montell D, MD   1.5 mg at 11/22/17 1403  . sodium chloride flush (NS) 0.9 % injection 10-40 mL  10-40 mL Intracatheter Q12H Alexis Goodell, MD   10 mL at 11/22/17 1152  . sodium chloride flush (NS) 0.9 % injection 10-40 mL  10-40 mL Intracatheter PRN Alexis Goodell, MD      . TPN (CLINIMIX-E) Adult   Intravenous Continuous TPN Gorden Harms, MD 83 mL/hr at 11/22/17 1703      Musculoskeletal: Strength & Muscle Tone: within normal limits Gait & Station: unable to stand Patient leans: N/A  Psychiatric Specialty Exam: Physical Exam  Constitutional: He appears well-developed and well-nourished.  HENT:  Head: Normocephalic and atraumatic.  Eyes: Pupils are equal, round, and reactive to light. Conjunctivae are normal.  Neck: Normal range of motion.  Cardiovascular: Normal heart sounds.  Respiratory: Effort normal.  GI: Soft.  Musculoskeletal: Normal range of motion.  Neurological: He is alert.  Skin: Skin is warm and dry.    Review of Systems  Constitutional: Negative.   HENT: Negative.   Eyes: Negative.   Respiratory: Negative.   Cardiovascular: Negative.   Gastrointestinal: Negative.   Musculoskeletal: Negative.   Skin: Negative.   Neurological: Negative.   Psychiatric/Behavioral: Negative.     Blood pressure 124/80, pulse 61, temperature 98.3 F (36.8 C), temperature source Oral, resp. rate 16, height '6\' 1"'  (1.854 m), weight 81.6 kg, SpO2 100 %.Body  mass index is 23.75 kg/m.  General Appearance: Casual  Eye Contact:  Fair  Speech:  Slow  Volume:  Decreased  Mood:  Depressed  Affect:  Constricted  Thought Process:  Goal  Directed  Orientation:  Full (Time, Place, and Person)  Thought Content:  Illogical  Suicidal Thoughts:  No  Homicidal Thoughts:  No  Memory:  Negative  Judgement:  Negative  Insight:  Negative  Psychomotor Activity:  Negative  Concentration:  Concentration: Negative  Recall:  Negative  Fund of Knowledge:  Negative  Language:  Negative  Akathisia:  Negative  Handed:  Right  AIMS (if indicated):     Assets:  Social Support  ADL's:  Impaired  Cognition:  Impaired,  Moderate  Sleep:        Treatment Plan Summary: Plan Patient appears to be stabilizing and probably will be ready for discharge relatively soon.  No need for any change in psychiatric medicine or treatment at this point.  I will sign off of any further help is required just call.  Disposition: No evidence of imminent risk to self or others at present.    Alethia Berthold, MD 11/22/2017 5:32 PM

## 2017-11-22 NOTE — Progress Notes (Signed)
PHARMACY - ADULT TOTAL PARENTERAL NUTRITION CONSULT NOTE   Pharmacy Consult for TPN Indication: malnutrition  Patient Measurements: Height: 6\' 1"  (185.4 cm) Weight: 180 lb (81.6 kg) IBW/kg (Calculated) : 79.9 TPN AdjBW (KG): 65.8 Body mass index is 23.75 kg/m.  Assessment: 57 yom cc leg pain. Patient's family is concerned for swallowing problems and malnutrition. He had some significant episodes of aspiration while trying to eat yesterday. Dr Jerelyn Charles is planning to put in a PEG tube today or tomorrow but the patient will continue to need the TPN for at least another 24 hours.  GI: Dysphagia and aspiration while attemting to eat Endo: Patient has a documented history of diabetes but doesn't have any anti-hyperglycemics recorded on his medication list.  Insulin requirements in the past 24 hours: 0 units SSI Lytes: Mg: 1.8, Phos: 3.2, K: 4.0  Will follow for possibility of refeeding per TPN protocol Renal: SCr 1.10 mg/dL, CrCl 81 mL/min Pulm:  Cards:  Hepatobil: Neuro: Patient is alert but not talking or eating. EEG with slowing, Neurology consulted and suspects some degree of metabolic encephalopathy due to psychiatric medications - psych is following. Patient has a history of schizoaffective disorder and bipolar disorder for which he takes clozapine and Depakote, both of which have been discontinued for pancytopenia ID: Zosyn for aspiration PNA  TPN Access: PICC placement 9/28 TPN start date:  11/18/17 Nutritional Goals (per RD recommendation on 11/19/17): 2233 kcal, 100 grams of protein, 2232 mL fluid daily including lipids.  Current Nutrition: TPN  Plan:  Continue goal TPN regimen of Clinimix E 5/20 at 83 mL/hr + 20% ILE at 20 mL/hr over 12 hours.  Add MVI, trace elements  Sensitive SSI ordered., adjust as needed  Monitor TPN labs, per protocol   Dallie Piles, Pharm.D Clinical Pharmacist 11/22/2017,1:32 PM

## 2017-11-22 NOTE — Progress Notes (Signed)
Physical Therapy Treatment Patient Details Name: Austin Gates MRN: [id number] DOB: [birth year] Today's Date: 11/22/2017    History of Present Illness Pt is a 57 y.o. male with a known history of tobacco abuse, psychiatric disorder and mental retardation.  Patient lives in an assisted living. He is not able to provide reliable history due to his mental status. Patient was sent to emergency room for increased drowsiness, confusion, generalized weakness and low blood pressure.  Most of his symptoms have been going on for the past 5 to 7 days, gradually getting worse.  No reports of fever, chills, nausea, vomiting, diarrhea, bleeding.  In the emergency room, patient was noted with systolic blood pressure in 70s.  This finally improved after 4 L of fluids to 80s.  Blood test done emergency room were notable for sodium level of 148, creatinine level 1.57 and BUN  44.  UA was negative for UTI and chest x-ray was negative for acute cardiopulmonary disease.  Abdominal x-ray shows large stool burden. Patient is admitted for further evaluation and treatment.  Assessment includes: Acute renal failure, likely prerenal, related to dehydration and possible side effects from medications, Acute encephalopathy, hypotension, and constipation.     PT Comments    Pt agreeable to PT; denies pain. Pt participates with supine bed exercises with some impulsivity and need for constant verbal and tactile cueing as well as re direction to task at hand. Pt often distracted with focus on wanting food and concern with therapist being mad at pt. Family notes pt has had increased concern with any/all caretakers being mad at him. Pt reassured therapist not mad with pt, but in fact glad pt able to participate with therapy today. Family notes also that plan is for pt to receive PEG tube, but unsure when. Continue PT to progress participation, strength and endurance to improve function and decrease level of assist.    Follow Up  Recommendations  SNF     Equipment Recommendations       Recommendations for Other Services       Precautions / Restrictions Precautions Precautions: Fall Restrictions Weight Bearing Restrictions: No    Mobility  Bed Mobility               General bed mobility comments: Not tested; pt refuses  Transfers                    Ambulation/Gait                 Stairs             Wheelchair Mobility    Modified Rankin (Stroke Patients Only)       Balance                                            Cognition Arousal/Alertness: Awake/alert Behavior During Therapy: WFL for tasks assessed/performed;Impulsive Overall Cognitive Status: History of cognitive impairments - at baseline                                 General Comments: Follows 1 step command inconsistently      Exercises General Exercises - Lower Extremity Ankle Circles/Pumps: AROM;Both;15 reps;Supine Quad Sets: Strengthening;Both;10 reps;Supine(performs well, but not on command) Short Arc Quad: AROM;Both;10 reps;Supine Heel Slides: AROM;Both;10 reps;Supine;Other (comment)(additional set with resisted  ext, B) Hip ABduction/ADduction: AROM;Both;10 reps;Supine Straight Leg Raises: AROM;Both;10 reps;Supine;Strengthening    General Comments        Pertinent Vitals/Pain Pain Assessment: No/denies pain    Home Living                      Prior Function            PT Goals (current goals can now be found in the care plan section) Progress towards PT goals: Progressing toward goals(slowly)    Frequency    Min 2X/week      PT Plan Current plan remains appropriate    Co-evaluation              AM-PAC PT "6 Clicks" Daily Activity  Outcome Measure  Difficulty turning over in bed (including adjusting bedclothes, sheets and blankets)?: A Little Difficulty moving from lying on back to sitting on the side of the bed? :  Unable Difficulty sitting down on and standing up from a chair with arms (e.g., wheelchair, bedside commode, etc,.)?: Unable Help needed moving to and from a bed to chair (including a wheelchair)?: A Lot Help needed walking in hospital room?: Total Help needed climbing 3-5 steps with a railing? : Total 6 Click Score: 9    End of Session Equipment Utilized During Treatment: Oxygen Activity Tolerance: Patient limited by fatigue;Other (comment)(focused on wanting to eat ) Patient left: in bed;with call bell/phone within reach;with bed alarm set;with family/visitor present;Other (comment)(telesitter)   PT Visit Diagnosis: Unsteadiness on feet (R26.81);Muscle weakness (generalized) (M62.81);Difficulty in walking, not elsewhere classified (R26.2);Repeated falls (R29.6)     Time: 4098-1191 PT Time Calculation (min) (ACUTE ONLY): 20 min  Charges:  $Therapeutic Exercise: 8-22 mins                      Larae Grooms, PTA 11/22/2017, 12:00 PM

## 2017-11-22 NOTE — Progress Notes (Signed)
Hematology/Oncology Progress Note [facility] Telephone:([phone] [phone]  Patient Care Team: Raelyn Number, MD as PCP - General (Internal Medicine)   Name of the patient: Austin Gates  [id number]  1960/02/25  Date of visit: 11/22/17   INTERVAL HISTORY-  No acute overnight events. Mental status further improved.  2 sisters at bedside.    Review of systems- Review of Systems  Unable to perform ROS: Mental status change  Respiratory: Negative for sputum production.   Cardiovascular: Negative for chest pain.  Gastrointestinal: Negative for abdominal pain, nausea and vomiting.  Neurological: Negative for tingling and tremors.  Psychiatric/Behavioral: Negative for hallucinations.    No Known Allergies  Patient Active Problem List   Diagnosis Date Noted  . Protein-calorie malnutrition, severe 11/20/2017  . Aspiration pneumonia of both lungs ([place])   . Hypotension   . Pancytopenia ([place])   . ARF (acute renal failure) ([place]) 11/15/2017  . Elevated lithium level 11/06/2017  . Fall 11/06/2017  . Schizoaffective disorder, bipolar type ([place]) 10/04/2016  . Tobacco use disorder 09/30/2016  . 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 Diabetes mellitus without complication ([place])   . Hypertension      Past Surgical History:  Procedure Laterality Date  . KNEE ARTHROSCOPY      Social History   Socioeconomic History  . Marital status: Single    Spouse name: Not on file  . Number of children: Not on file  . Years of education: Not on file  . Highest education level: Not on file  Occupational History  . Not on file  Social Needs  . Financial resource strain: Not on file  . Food insecurity:    Worry: Not on file    Inability: Not on file  . Transportation needs:    Medical: Not on file    Non-medical: Not on file  Tobacco Use  . Smoking status: Current Every Day Smoker   Packs/day: 1.00    Types: Cigarettes  . Smokeless tobacco: Never Used  Substance and Sexual Activity  . Alcohol use: No  . Drug use: No  . Sexual activity: Never  Lifestyle  . Physical activity:    Days per week: Not on file    Minutes per session: Not on file  . Stress: Not on file  Relationships  . Social connections:    Talks on phone: Not on file    Gets together: Not on file    Attends religious service: Not on file    Active member of club or organization: Not on file    Attends meetings of clubs or organizations: Not on file    Relationship status: Not on file  . Intimate partner violence:    Fear of current or ex partner: Not on file    Emotionally abused: Not on file    Physically abused: Not on file    Forced sexual activity: Not on file  Other Topics Concern  . Not on file  Social History Narrative  . Not on file     Family History  Problem Relation Age of Onset  . Diabetes Mother      Current Facility-Administered Medications:  .  acetaminophen (TYLENOL) tablet 650 mg, 650 mg, Oral, Q6H PRN **OR** acetaminophen (TYLENOL) suppository 650 mg, 650 mg, Rectal, Q6H PRN, Amelia Jo, MD .  bisacodyl (DULCOLAX) EC tablet 5 mg, 5 mg, Oral, Daily PRN, Amelia Jo, MD, 5  mg at 11/17/17 0954 .  chlorhexidine (PERIDEX) 0.12 % solution 15 mL, 15 mL, Mouth Rinse, BID, Salary, Montell D, MD, 15 mL at 11/21/17 2200 .  cholecalciferol (VITAMIN D) tablet 5,000 Units, 5,000 Units, Oral, Daily, Amelia Jo, MD, 5,000 Units at 11/20/17 1105 .  docusate sodium (COLACE) capsule 100 mg, 100 mg, Oral, BID, Amelia Jo, MD, 100 mg at 11/17/17 0751 .  guaiFENesin (MUCINEX) 12 hr tablet 600 mg, 600 mg, Oral, BID, Salary, Montell D, MD, 600 mg at 11/20/17 1346 .  haloperidol lactate (HALDOL) injection 2 mg, 2 mg, Intramuscular, Q8H PRN, Salary, Montell D, MD .  insulin aspart (novoLOG) injection 0-9 Units, 0-9 Units, Subcutaneous, Q6H, Salary, Montell D, MD, 1 Units at 11/21/17  0100 .  ipratropium-albuterol (DUONEB) 0.5-2.5 (3) MG/3ML nebulizer solution 3 mL, 3 mL, Nebulization, Q4H PRN, Salary, Montell D, MD, 3 mL at 11/20/17 1614 .  MEDLINE mouth rinse, 15 mL, Mouth Rinse, q12n4p, Salary, Montell D, MD .  midodrine (PROAMATINE) tablet 5 mg, 5 mg, Oral, TID WC, Dustin Flock, MD, 5 mg at 11/21/17 0826 .  nicotine (NICODERM CQ - dosed in mg/24 hours) patch 14 mg, 14 mg, Transdermal, Daily, Salary, Montell D, MD, 14 mg at 11/22/17 1212 .  ondansetron (ZOFRAN) tablet 4 mg, 4 mg, Oral, Q6H PRN **OR** ondansetron (ZOFRAN) injection 4 mg, 4 mg, Intravenous, Q6H PRN, Amelia Jo, MD .  phenol (CHLORASEPTIC) mouth spray 2 spray, 2 spray, Mouth/Throat, PRN, Epifanio Lesches, MD .  piperacillin-tazobactam (ZOSYN) IVPB 3.375 g, 3.375 g, Intravenous, Q8H, Salary, Montell D, MD, Last Rate: 12.5 mL/hr at 11/22/17 1205, 3.375 g at 11/22/17 1205 .  polyethylene glycol (MIRALAX / GLYCOLAX) packet 17 g, 17 g, Oral, Darl Householder, MD, 17 g at 11/17/17 0751 .  scopolamine (TRANSDERM-SCOP) 1 MG/3DAYS 1.5 mg, 1 patch, Transdermal, Q72H, Salary, Montell D, MD .  sodium chloride flush (NS) 0.9 % injection 10-40 mL, 10-40 mL, Intracatheter, Q12H, Alexis Goodell, MD, 10 mL at 11/22/17 1152 .  sodium chloride flush (NS) 0.9 % injection 10-40 mL, 10-40 mL, Intracatheter, PRN, Alexis Goodell, MD .  TPN (CLINIMIX-E) Adult, , Intravenous, Continuous TPN, Last Rate: 83 mL/hr at 11/22/17 0816 **AND** [EXPIRED] Fat emulsion 20 % infusion 250 mL, 250 mL, Intravenous, Continuous TPN, Salary, Montell D, MD, Stopped at 11/22/17 0601   Physical exam:  Vitals:   11/21/17 1844 11/21/17 1909 11/21/17 2057 11/22/17 1038  BP: 110/75 118/75 113/74   Pulse: 61 66 65 68  Resp: 18 18 20    Temp: (!) 97.5 F (36.4 C) (!) 97.5 F (36.4 C) (!) 97.5 F (36.4 C)   TempSrc: Oral Oral Oral   SpO2: 94% 95% 97% 95%  Weight:   180 lb (81.6 kg)   Height:       Physical Exam  Constitutional: He  appears distressed.  HENT:  Head: Normocephalic and atraumatic.  Eyes: Pupils are equal, round, and reactive to light. EOM are normal.  Neck: Neck supple.  Cardiovascular: Normal rate.  No murmur heard. Pulmonary/Chest: Effort normal. No respiratory distress.  diminished breath sound bilaterally.  Abdominal: Soft. Bowel sounds are normal.  Musculoskeletal: Normal range of motion. He exhibits no edema.  Neurological: He is alert.  Skin: Skin is warm and dry.  Psychiatric: Mood normal.       CMP Latest Ref Rng & Units 11/21/2017  Glucose 70 - 99 mg/dL 131(H)  BUN 6 - 20 mg/dL 28(H)  Creatinine 0.61 - 1.24 mg/dL 1.10  Sodium 135 -  145 mmol/L 140  Potassium 3.5 - 5.1 mmol/L 4.0  Chloride 98 - 111 mmol/L 107  CO2 22 - 32 mmol/L 27  Calcium 8.9 - 10.3 mg/dL 9.0  Total Protein 6.5 - 8.1 g/dL -  Total Bilirubin 0.3 - 1.2 mg/dL -  Alkaline Phos 38 - 126 U/L -  AST 15 - 41 U/L -  ALT 0 - 44 U/L -   CBC Latest Ref Rng & Units 11/22/2017  WBC 3.8 - 10.6 K/uL 3.6(L)  Hemoglobin 13.0 - 18.0 g/dL 9.0(L)  Hematocrit 40.0 - 52.0 % 25.7(L)  Platelets 150 - 440 K/uL 84(L)   RADIOGRAPHIC STUDIES: I have personally reviewed the radiological images as listed and agreed with the findings in the report.  Dg Chest 2 View  Result Date: 11/18/2017 CLINICAL DATA:  Acute toxic metabolic encephalopathy. EXAM: CHEST - 2 VIEW COMPARISON:  Chest film earlier in the day. FINDINGS: Low lung volumes accentuate the cardiac silhouette which is probably within normal limits, given the degree of inspiration. BILATERAL effusions are increased, particularly on the RIGHT, with associated increasing bibasilar opacities which could represent atelectasis or infiltrate. Tortuous aorta. No osseous findings of significance. IMPRESSION: BILATERAL RIGHT greater than LEFT pleural effusions. Bibasilar opacities which could represent atelectasis or pneumonia. Worsening aeration from priors. Electronically Signed   By: Staci Righter M.D.   On: 11/18/2017 13:59   Dg Chest 2 View  Result Date: 11/15/2017 CLINICAL DATA:  Weakness, leg pain. EXAM: CHEST - 2 VIEW COMPARISON:  None. FINDINGS: The heart size and mediastinal contours are within normal limits. Both lungs are clear. No pneumothorax or pleural effusion is noted. The visualized skeletal structures are unremarkable. IMPRESSION: No active cardiopulmonary disease. Electronically Signed   By: Marijo Conception, M.D.   On: 11/15/2017 16:55   Dg Abd 1 View  Result Date: 11/18/2017 CLINICAL DATA:  Acute toxic metabolic encephalopathy. Abdominal pain. EXAM: ABDOMEN - 1 VIEW COMPARISON:  CT lumbar spine 11/17/2016, abdomen radiograph 11/15/2017. FINDINGS: There is extreme constipation, distended colon filled with stool, from the RIGHT colon to the rectum. This is confirmed on prior CT lumbar. Mildly prominent small bowel loops could represent ileus or early obstruction. Prominent stomach. Lower lumbar facet arthropathy. No visible calcifications. IMPRESSION: Extreme constipation. Mildly prominent small bowel loops could represent ileus or developing early obstruction. Electronically Signed   By: Staci Righter M.D.   On: 11/18/2017 13:57   Ct Head Wo Contrast  Result Date: 11/17/2017 CLINICAL DATA:  Altered mental status EXAM: CT HEAD WITHOUT CONTRAST TECHNIQUE: Contiguous axial images were obtained from the base of the skull through the vertex without intravenous contrast. COMPARISON:  November 06, 2017 FINDINGS: Brain: The ventricles are normal in size and configuration. There is no appreciable intracranial mass, hemorrhage, extra-axial fluid collection, or midline shift. Brain parenchyma appears unremarkable. No evident acute infarct. Vascular: No hyperdense vessel. No appreciable vascular calcification. Skull: Bony calvarium appears intact. Sinuses/Orbits: There is mucosal thickening in several ethmoid air cells. Other visualized paranasal sinuses are clear. Orbits appear  symmetric bilaterally. Other: There is mild opacification in several inferior mastoid air cells. Most of the mastoid air cells are clear. IMPRESSION: Mild inferior mastoid disease bilaterally. Mucosal thickening in several ethmoid air cells. Study otherwise unremarkable. Electronically Signed   By: Lowella Grip III M.D.   On: 11/17/2017 11:16   Ct Head Wo Contrast  Result Date: 11/06/2017 CLINICAL DATA:  Ataxia, head trauma. EXAM: CT HEAD WITHOUT CONTRAST TECHNIQUE: Contiguous axial images were obtained  from the base of the skull through the vertex without intravenous contrast. COMPARISON:  09/17/2016 FINDINGS: Brain: No acute intracranial abnormality. Specifically, no hemorrhage, hydrocephalus, mass lesion, acute infarction, or significant intracranial injury. Vascular: No hyperdense vessel or unexpected calcification. Skull: No acute calvarial abnormality. Sinuses/Orbits: Visualized paranasal sinuses and mastoids clear. Orbital soft tissues unremarkable. Other: None IMPRESSION: No acute intracranial abnormality. Electronically Signed   By: Rolm Baptise M.D.   On: 11/06/2017 14:56   Ct Chest Wo Contrast  Result Date: 11/19/2017 CLINICAL DATA:  Lethargic, abnormal chest x-ray.  Pancytopenia. EXAM: CT CHEST WITHOUT CONTRAST TECHNIQUE: Multidetector CT imaging of the chest was performed following the standard protocol without IV contrast. COMPARISON:  None. FINDINGS: Cardiovascular: No significant vascular findings. Normal heart size. No pericardial effusion. Thoracic aortic atherosclerosis. Mediastinum/Nodes: No enlarged mediastinal or axillary lymph nodes. Thyroid gland, trachea, and esophagus demonstrate no significant findings. Lungs/Pleura: Bilateral small pleural effusions. Bilateral lower lobe airspace disease likely reflecting compressive atelectasis and/or pneumonia. Patchy areas of airspace disease in bilateral upper lobes and right middle lobe concerning for multilobar pneumonia. No  pneumothorax. Upper Abdomen: No acute abnormality. Musculoskeletal: No acute osseous abnormality. No aggressive osseous lesion. IMPRESSION: 1. Findings concerning for multilobar pneumonia. Small bilateral pleural effusions. Electronically Signed   By: Kathreen Devoid   On: 11/19/2017 13:47   Ct Lumbar Spine Wo Contrast  Result Date: 11/17/2017 CLINICAL DATA:  Lower extremity weakness and multiple falls EXAM: CT LUMBAR SPINE WITHOUT CONTRAST TECHNIQUE: Multidetector CT imaging of the lumbar spine was performed without intravenous contrast administration. Multiplanar CT image reconstructions were also generated. COMPARISON:  None. FINDINGS: Segmentation: 5 lumbar type vertebrae. Alignment: Normal. Vertebrae: There is an intermediate sized Schmorl's node at the superior L5 endplate. There is endplate sclerosis at C5-8 and L5-S1, worst at the inferior L4 endplate. No acute fracture. No lytic or blastic lesion. Paraspinal and other soft tissues: Negative. Disc levels: T11-T12: No disc herniation or stenosis.  Normal facets. T12-L1: No disc herniation or stenosis.  Normal facets. L1-L2: No disc herniation or stenosis.  Normal facets. L2-L3: Mild disc degeneration and endplate spurring. Normal facets. No spinal canal or neural foraminal stenosis. L3-L4: Minimal endplate spurring. No disc herniation or stenosis. Mild facet hypertrophy. L4-L5: Mild facet hypertrophy. Endplate remodeling with small disc bulge and minimal spurring. No spinal canal stenosis. Mild bilateral foraminal narrowing. L5-S1: Normal disc. Moderate facet hypertrophy. No spinal canal stenosis. Mild bilateral foraminal narrowing. IMPRESSION: 1. No acute fracture or listhesis. 2. Mild bilateral foraminal stenosis at L4-5 and L5-S1, predominantly due to facet arthrosis. 3. L4-L5 and L5-S1 degenerative disc disease with endplate remodeling. 4. No spinal canal stenosis. Electronically Signed   By: Ulyses Jarred M.D.   On: 11/17/2017 17:50   Mr Brain Wo  Contrast  Result Date: 11/19/2017 CLINICAL DATA:  Altered mental status, unclear cause, nonambulatory. Ataxia with head trauma was given as the history on 11/06/17. EXAM: MRI HEAD WITHOUT CONTRAST TECHNIQUE: Multiplanar, multiecho pulse sequences of the brain and surrounding structures were obtained without intravenous contrast. COMPARISON:  Prior CT head 11/06/2017.  Also CT head 11/17/2017. FINDINGS: Brain: No acute stroke, acute hemorrhage, mass lesion, or hydrocephalus. Normal for age cerebral volume. No significant white matter disease. No parenchymal hemorrhage. BILATERAL extra-axial cerebral and cerebellar CSF fluid collections are seen, primarily over the bifrontal convexity, reflecting subdural hygromas. These measure 5 mm thick on the RIGHT and LEFT over the frontal lobes. No blood products to suggest hematoma. The hygromas have developed since the initial CT  of 11/06/2017, and are similar in appearance to 11/17/2017 CT. Vascular: Normal flow voids. Skull and upper cervical spine: No visible skull fracture. Incidental arachnoid granulation projects into the LEFT paramedian frontal bone. Sinuses/Orbits: Unremarkable. Other: None.  These measure IMPRESSION: BILATERAL 5 mm thick subdural hygromas, predominantly supratentorial. No blood products to suggest hematoma. These collections have developed since the previous CT scan of 11/06/2017, but their relation to the patient's current symptoms is unclear. A text page was placed to the ordering provider at 1:15 p.m. Electronically Signed   By: Staci Righter M.D.   On: 11/19/2017 13:16   US Abdomen Complete  Result Date: 11/19/2017 CLINICAL DATA:  Pancytopenia EXAM: ABDOMEN ULTRASOUND COMPLETE COMPARISON:  None. FINDINGS: Gallbladder: No gallstones or wall thickening visualized. No sonographic Murphy sign noted by sonographer. Common bile duct: Diameter: Normal at 5 mm Liver: Normal liver parenchyma. Portal vein is patent on color Doppler imaging with normal  direction of blood flow towards the liver. IVC: No abnormality visualized. Pancreas: Visualized portion unremarkable. Spleen: Size and appearance within normal limits. Right Kidney: Length: 9.9 cm.  There is Left Kidney: Length: 9.2 cm. Echogenicity within normal limits. No mass or hydronephrosis visualized. Abdominal aorta: No aneurysm visualized. Other findings: Large amount of bowel gas and stool limits exam. Small amount of ascites. IMPRESSION: 1. No acute abdominal findings. 2. Small volume ascites. 3. Limitations due to bowel gas and stool. Electronically Signed   By: Suzy Bouchard M.D.   On: 11/19/2017 15:42   Dg Chest Port 1 View  Result Date: 11/18/2017 CLINICAL DATA:  Hypothermia and altered mental status. Poor historian. Current smoker. EXAM: PORTABLE CHEST 1 VIEW COMPARISON:  11/15/2017 FINDINGS: Grossly unchanged cardiac silhouette and mediastinal contours given reduced lung volumes. Interval development potential small bilateral effusions with associated worsening bibasilar opacities, left greater than right. Mild pulmonary is congestion without frank evidence of edema. No pneumothorax. [place] are again noted superior to the left coracoid process. No acute osseus abnormalities. IMPRESSION: Suspected small bilateral effusions with worsening bibasilar opacities, left greater than right, atelectasis versus infiltrate. Further evaluation with a PA and lateral chest radiograph may be obtained as clinically indicated. Electronically Signed   By: Sandi Mariscal M.D.   On: 11/18/2017 08:58   Dg Abd 2 Views  Result Date: 11/15/2017 CLINICAL DATA:  Distention EXAM: ABDOMEN - 2 VIEW COMPARISON:  None. FINDINGS: Large stool burden throughout the colon. There is normal bowel gas pattern. No free air. No organomegaly or suspicious calcification. No acute bony abnormality. Visualized lung bases clear. IMPRESSION: Large stool burden.  No acute findings. Electronically Signed   By: Rolm Baptise M.D.   On:  11/15/2017 18:40   [place] Ekg Site Rite  Result Date: 11/18/2017 If Site Rite image not attached, placement could not be confirmed due to current cardiac rhythm.   Assessment and plan-  Patient is a 57 y.o. male who has psychiatric disordered presents with altered mental status.   # Thrombocytopenia, continue to improve.   # AMS, much improved after psychiatry medication were discontinued . Neurology on board.  #Acute bilateral aspiration pneumonia, likely was secondary to his initial altered mental status. Continue IV antibiotics.  # Weight loss, elevated CEA level. Recommend pursuing GI workup. Discussed with Dr.Salary, will obtain CT abdomen. If negative. Gastroenterology evaluation.   Hem/Onc will sign off at this point as his blood counts have improved. Recommend patient to follow up with me outpatient, especially after he resumes on anti psychiatry medication, his blood counts  need to be monitored periodically.   Discussed with Dr.Salary.  Thank you for allowing me to participate in the care of this patient.   Earlie Server, MD, PhD Hematology Oncology [facility] Pager- [phone] 11/22/2017

## 2017-11-22 NOTE — Progress Notes (Signed)
Daily Progress Note   Patient Name: Austin Gates       Date: 11/22/2017 DOB: Jun 26, 1960  Age: 57 y.o. MRN#: [id number] Attending Physician: Gorden Harms, MD Primary Care Physician: Raelyn Number, MD Admit Date: 11/15/2017  Reason for Consultation/Follow-up: Establishing goals of care  Subjective: Patient is lying in bed. He is more awake and alert on today. He is able to recognize family members and answer some questions. He is smiling and appears to be comfortable. Denies pain or shortness of breath. Remains NPO with plans pending for PEG tube placement due to high risk and evidence of aspiration. Continues on TPN _0 /hr. He received a 2 units of platelets yesterday. Platelets up to 84 today from 42 on yesterday.   Family is at the bedside. They requested to have a meeting this morning in regards to decision making and plans to move forward with PEG tube. Patient's sisters [name]) and her husband, [name] Civil engineer, contracting of group home), and Patent attorney (Primary caregiver at the group home) were present for conversation at [facility] request. I met with family and we discussed in details reasoning for suggested PEG tube placement as well as risk and benefits. Dr. Jerelyn Charles also joined in meeting and confirmed medical reasoning and options. Family verbalized awareness that outside of PEG tube not being placed the only other option is to continue to allow patient to eat by mouth with awareness of high risk of aspiration. They are aware that in that situation patient would be most appropriate for hospice services and also should reconsider code status. Family was very appreciative of medical team working together and providing similar information. [name] (POA) verbalized that they have decided to  proceed with PEG tube placement to allow patient a chance to recover. Family was supportive and all remain hopeful for improvement.   We also discussed the need for SNF placement at discharge for rehabilitation. [name] expressed her concern with patient returning back to her group home facility given his current needs, level of care, and PEG tube requirements. She discussed the need to further discuss with her care coordinator and staff in regards to the ability to meet the needs if he was to return after rehab. Family verbalizes that their goal would be for patient to return to the group home if possible after rehab,  with hopes he would have some signs of improvement. Family verbalized that they are at no position to care for him in the home as they cared or him years prior and was unable to, do to their own health issues, and caring for other family members in the home. Again [name] expressed her goals were for [name] to be able to go to rehab and get stronger and return to his group home.   [name] would like to continue with full aggressive measures including CPR, defibrillation, and intubation if needed for a limited time trial.   Chart Reviewed and report received from bedside RN.   Length of Stay: 7  Current Medications: Scheduled Meds:  . chlorhexidine  15 mL Mouth Rinse BID  . cholecalciferol  5,000 Units Oral Daily  . docusate sodium  100 mg Oral BID  . guaiFENesin  600 mg Oral BID  . insulin aspart  0-9 Units Subcutaneous Q6H  . mouth rinse  15 mL Mouth Rinse q12n4p  . midodrine  5 mg Oral TID WC  . nicotine  14 mg Transdermal Daily  . polyethylene glycol  17 g Oral QODAY  . scopolamine  1 patch Transdermal Q72H  . sodium chloride flush  10-40 mL Intracatheter Q12H    Continuous Infusions: . piperacillin-tazobactam (ZOSYN)  IV 3.375 g (11/22/17 1205)  . Marland KitchenTPN (CLINIMIX-E) Adult 83 mL/hr at 11/22/17 0816    PRN Meds: acetaminophen **OR** acetaminophen, bisacodyl,  haloperidol lactate, ipratropium-albuterol, ondansetron **OR** ondansetron (ZOFRAN) IV, phenol, sodium chloride flush  Physical Exam  Constitutional: Vital signs are normal. He is cooperative. He appears ill.  Thin and chronically ill appearing.   Cardiovascular: Normal rate, regular rhythm, normal heart sounds and normal pulses.  Pulmonary/Chest: Effort normal. He has decreased breath sounds. He has rhonchi.  Abdominal: Normal appearance and bowel sounds are normal.  Musculoskeletal:  Generalized weakness   Neurological: He is alert.  Alert & Oriented with psych history and mental retardation   Skin: Skin is warm, dry and intact.  Psychiatric: Cognition and memory are impaired. He expresses inappropriate judgment.  Nursing note and vitals reviewed.           Vital Signs: BP 113/74 (BP Location: Left Arm)   Pulse 68   Temp (!) 97.5 F (36.4 C) (Oral)   Resp 20   Ht _0  (1.854 m)   Wt 81.6 kg   SpO2 95%   BMI 23.75 kg/m  SpO2: SpO2: 95 % O2 Device: O2 Device: Nasal Cannula O2 Flow Rate: O2 Flow Rate (L/min): 2 L/min  Intake/output summary:   Intake/Output Summary (Last 24 hours) at 11/22/2017 1242 Last data filed at 11/22/2017 0816 Gross per 24 hour  Intake 2568.11 ml  Output 2326 ml  Net 242.11 ml   LBM: Last BM Date: 11/22/17 Baseline Weight: Weight: 65.8 kg Most recent weight: Weight: 81.6 kg     Palliative Assessment/Data: PPS 30%, NPO pending PEG tube placement.    Flowsheet Rows     Most Recent Value  Intake Tab  Referral Department  Hospitalist  Unit at Time of Referral  Med/Surg Unit  Palliative Care Primary Diagnosis  Neurology  Date Notified  11/19/17  Palliative Care Type  New Palliative care  Reason for referral  Clarify Goals of Care  Date of Admission  11/15/17  Date first seen by Palliative Care  11/20/17  # of days Palliative referral response time  1 Day(s)  # of days IP prior to Palliative referral  4  Clinical Assessment  Psychosocial &  Spiritual Assessment  Palliative Care Outcomes      Patient Active Problem List   Diagnosis Date Noted  . Protein-calorie malnutrition, severe 11/20/2017  . Aspiration pneumonia of both lungs ([place])   . Hypotension   . Pancytopenia ([place])   . ARF (acute renal failure) ([place]) 11/15/2017  . Elevated lithium level 11/06/2017  . Fall 11/06/2017  . Schizoaffective disorder, bipolar type ([place]) 10/04/2016  . Tobacco use disorder 09/30/2016  . 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 He presented to the ED with complaints of increased confusion, generalized weakness, and low blood pressure. He has a past medical history significant ofmental retardation, schizoaffective disorder, tobacco use, and bipolar disorder.Patient currently lives at the group home and was unable to provide history due to mental status. During his ED course it was reported that most of the patient's symptoms have been going on for 5 to 7 days prior to admission and gradually worsening. No reports of fever, chills, nausea, vomiting, diarrhea, or bleeding. Facility worker also reports patient has experienced some weight loss in the past months despite having a good appetite and also noted that patient was started on a new medication for nocturnal enuresis (desmopressin). They also reported that patient's lithium has been discontinued 2 weeks prior. Patient was given a total of 4 L of fluid due to hypotension with systolic blood pressure in the 70s. Sodium 148. Creatinine 1.57. BUN 44. Platelet 96. UA negative for UTI. Chest x-ray is negative for acute cardiopulmonary disease. Abdominal x-ray showed large stool burden. Admission patient has been seen by neurology and psychiatry. MRI of the brain noted. 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 Patient also seen by speech and dietitian. Patient was started on TPN, and after increasing alertness has now been placed on pured diet with nectar thickened liquids. Palliative medicine team consulted for goals of care discussion.  Recommendations/Plan:  Full Code-as requested by family   Pending PEG tube placement. Family confirms they would like to proceed with procedure. [name] reports she remains hopeful that patient will continue to improve after PEG and be able to go to a facility for rehab and return to his group home. Met with family (Dr. Jerelyn Charles) also involved in discussion. Family is aware of medical reasoning for PEG, pros, and cons.   PMT will continue to support and follow.   Goals of Care and Additional Recommendations:  Limitations on Scope of Treatment: Full Scope Treatment  Code Status:    Code Status Orders  (From admission, onward)         Start     Ordered   11/16/17 0019  Full code  Continuous     11/16/17 0018        Code Status History    Date Active Date Inactive Code Status Order ID Comments User Context   09/29/2016 2043 10/07/2016 1514 Full Code [id number]  Gonzella Lex, MD Inpatient   09/17/2016 1901 09/18/2016 1556 Full Code [id number]  Demetrios Loll, MD Inpatient      Prognosis:   Unable to determine  Discharge Planning:  To Be Determined  Care plan was discussed with patient, patient's family, bedside RN, and Dr. Jerelyn Charles.   Thank you for allowing the Palliative Medicine Team  to assist in the care of this patient.   Time In: 0930 Time Out: 1035 Total Time 65 min.  Prolonged Time Billed  YES       Greater than 50%  of this time was spent counseling and coordinating care related to the above assessment and plan.  Alda Lea, AGPCNP-BC Palliative Medicine Team  Pager: [phone] Amion: Bjorn Pippin   Please contact Palliative  Medicine Team phone at [phone] for questions and concerns.

## 2017-11-22 NOTE — Progress Notes (Signed)
[facility] [place] NAME: Austin Gates    MR#:  [id number]  DATE OF BIRTH:  02-19-1961  SUBJECTIVE:  CHIEF COMPLAINT:   Chief Complaint  Patient presents with  . Leg Pain  Patient for PEG tube later today or tomorrow by interventional radiology, family meeting had today with all questions answered REVIEW OF SYSTEMS:  CONSTITUTIONAL: No fever, fatigue or weakness.  EYES: No blurred or double vision.  EARS, NOSE, AND THROAT: No tinnitus or ear pain.  RESPIRATORY: No cough, shortness of breath, wheezing or hemoptysis.  CARDIOVASCULAR: No chest pain, orthopnea, edema.  GASTROINTESTINAL: No nausea, vomiting, diarrhea or abdominal pain.  GENITOURINARY: No dysuria, hematuria.  ENDOCRINE: No polyuria, nocturia,  HEMATOLOGY: No anemia, easy bruising or bleeding SKIN: No rash or lesion. MUSCULOSKELETAL: No joint pain or arthritis.   NEUROLOGIC: No tingling, numbness, weakness.  PSYCHIATRY: No anxiety or depression.   ROS  DRUG ALLERGIES:  No Known Allergies  VITALS:  Blood pressure 113/74, pulse 68, temperature (!) 97.5 F (36.4 C), temperature source Oral, resp. rate 20, height 6\' 1"  (1.854 m), weight 81.6 kg, SpO2 95 %.  PHYSICAL EXAMINATION:  GENERAL:  57 y.o.-year-old patient lying in the bed with no acute distress.  EYES: Pupils equal, round, reactive to light and accommodation. No scleral icterus. Extraocular muscles intact.  HEENT: Head atraumatic, normocephalic. Oropharynx and nasopharynx clear.  NECK:  Supple, no jugular venous distention. No thyroid enlargement, no tenderness.  LUNGS: Normal breath sounds bilaterally, no wheezing, rales,rhonchi or crepitation. No use of accessory muscles of respiration.  CARDIOVASCULAR: S1, S2 normal. No murmurs, rubs, or gallops.  ABDOMEN: Soft, nontender, nondistended. Bowel sounds present. No organomegaly or mass.  EXTREMITIES: No pedal edema, cyanosis, or clubbing.  NEUROLOGIC: Cranial  nerves II through XII are intact. Muscle strength 5/5 in all extremities. Sensation intact. Gait not checked.  PSYCHIATRIC: The patient is alert and oriented x 3.  SKIN: No obvious rash, lesion, or ulcer.   Physical Exam LABORATORY PANEL:   CBC Recent Labs  Lab 11/22/17 0430  WBC 3.6*  HGB 9.0*  HCT 25.7*  PLT 84*   ------------------------------------------------------------------------------------------------------------------  Chemistries  Recent Labs  Lab 11/19/17 0432  11/21/17 0440  NA 144   < > 140  K 4.3   < > 4.0  CL 112*   < > 107  CO2 28   < > 27  GLUCOSE 118*   < > 131*  BUN 25*   < > 28*  CREATININE 1.17   < > 1.10  CALCIUM 9.0   < > 9.0  MG 1.6*   < > 1.8  AST 36  --   --   ALT 29  --   --   ALKPHOS 65  --   --   BILITOT 0.6  --   --    < > = values in this interval not displayed.   ------------------------------------------------------------------------------------------------------------------  Cardiac Enzymes Recent Labs  Lab 11/15/17 1622  TROPONINI <0.03   ------------------------------------------------------------------------------------------------------------------  RADIOLOGY:  No results found.  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 to maintain body weight Speech therapy input appreciated-failed pured diet with nectar thickened liquids, continue TPN, for IR placed PEG tube today or tomorrow, status post platelet transfusion given thrombocytopenia  prior to procedure November 21, 2017  *Acute on chronic moderate to severe protein calorie/energy malnutrition Stable continue TPN, follow-up on CEA elevated at 11.1, AFP- nl, CA 19-9 nl, PEG tube placement as stated above  *Pancytopenia Stable Oncology input appreciated Most likely related to psychotropic medications Abdominal ultrasound unimpressive, oncology/hematology input appreciated, anemia work-up consistent with iron deficiency-treated with IV iron, status post platelet transfusion given thrombocytopenia-4 planned Peg-tube placement  *Acute elevated CEA level Check CT abdomen/pelvis, gastroenterology to see  *Acute kidney injury Resolved with IV fluids for rehydration  *Acute bilateral aspiration pneumonia, present on admission  Resolving Secondary to encephalopathy and dysphasia Chest x-rays were always inconclusive, CT of the chest noted for bilateral pneumonia Pneumonia protocol, empiric Zosyn for 5-day course, aspiration precautions  *Schizoaffective/bipolar disorder Psychiatry input appreciated  Psychotropic medications were discontinued which resulted in resolution of encephalopathy     *Tobacco smoking abuse/dependency  Stable Nicotine patch and cessation counseling ordered  *Acute hypomagnesemia Repleted  Disposition to skilled nursing facility after medically stable  All the records are reviewed and case discussed with Care Management/Social Workerr. Management plans discussed with the patient, family and they are in agreement.  CODE STATUS: full  TOTAL TIME TAKING CARE OF THIS PATIENT: 45 minutes.   POSSIBLE D/C IN 3 DAYS, DEPENDING ON CLINICAL CONDITION.   Austin Gates Austin Gates M.D on 11/22/2017   Between 7am to 6pm - Pager - [phone]  After 6pm go to [email or web address] - password [email or web address] [facility]  Office  [phone]  CC: Primary care physician; Austin Number, MD  Note: This dictation was prepared with Dragon dictation  along with smaller phrase technology. Any transcriptional errors that result from this process are unintentional.

## 2017-11-22 NOTE — Consult Note (Signed)
[facility] GI Inpatient Consult Note   Kathline Magic, M.D.  Reason for Consult: Elevated CEA level, weight loss.   Attending Requesting Consult: Loney Hering, M.D.  Outpatient Primary Physician: Celedonio Miyamoto, M.D.  History of Present Illness: Austin Gates is a 57 y.o. male presenting for altered mental status significant sedation and aspiration pneumonia.  Patient has been hypotensive but this has improved.  He is taking total parenteral nutrition given his high aspiration risk.  He he is expecting a gastrostomy tube placement by interventional radiology within the next 24 hours. Patient was recently worked up by oncology for weight loss and there was noted to be significant elevation of the CEA level to 11.1 (4.7 the upper limit of normal). The patient lives in a group home and his sister, Austin Gates, who apparently is also his legal guardian has left for the day.  The gastroenterology service has been asked to consider endoluminal evaluation in the form of EGD and colonoscopy for evaluation of elevated CEA level.  Abdominal CT scan was performed showing no evident intestinal abnormalities other than large amount of stool in the colon.  In an attempt to discuss the case with the patient, he appears confused and unaware of surroundings or times a day or year.  Patient's sister or other family is not present.  Past Medical History:  Past Medical History:  Diagnosis Date  . Diabetes mellitus without complication ([place])   . Hypertension     Problem List: Patient Active Problem List   Diagnosis Date Noted  . Protein-calorie malnutrition, severe 11/20/2017  . Aspiration pneumonia of both lungs ([place])   . Hypotension   . Pancytopenia ([place])   . ARF (acute renal failure) ([place]) 11/15/2017  . Elevated lithium level 11/06/2017  . Fall 11/06/2017  . Schizoaffective disorder, bipolar type ([place]) 10/04/2016  . Tobacco use disorder 09/30/2016  . 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 KNEE ARTHROSCOPY      Allergies: No Known Allergies  Home Medications: Medications Prior to Admission  Medication Sig Dispense Refill Last Dose  . Cholecalciferol (VITAMIN D-3) 5000 units TABS Take 1 tablet by mouth daily.     . cloZAPine (CLOZARIL) 100 MG tablet Take 1 tablet (100 mg total) by mouth 2 (two) times daily. 60 tablet 0   . cloZAPine (CLOZARIL) 100 MG tablet Take 3 tablets (300 mg total) by mouth at bedtime. 90 tablet 0   . desmopressin (DDAVP) 0.2 MG tablet Take 0.2 mg by mouth at bedtime.     . divalproex (DEPAKOTE ER) 500 MG 24 hr tablet Take 3 tablets (1,500 mg total) by mouth at bedtime. 90 tablet 0   . docusate sodium (COLACE) 100 MG capsule Take 1 capsule (100 mg total) by mouth 2 (two) times daily. 60 capsule 0   . hydrALAZINE (APRESOLINE) 25 MG tablet Take 25 mg by mouth at bedtime.     . metoprolol tartrate (LOPRESSOR) 25 MG tablet Take 0.5 tablets (12.5 mg total) by mouth 2 (two) times daily. 60 tablet 0   . ibuprofen (ADVIL,MOTRIN) 600 MG tablet Take 1 tablet (600 mg total) by mouth every 8 (eight) hours as needed. 30 tablet 0  at Unknown time  . polyethylene glycol (MIRALAX / GLYCOLAX) packet Take 17 g by mouth every other day. (Patient not taking: Reported on 11/16/2017) 15 each 0 Not Taking at Unknown time   Home medication reconciliation was completed  with the patient.   Scheduled Inpatient Medications:   . chlorhexidine  15 mL Mouth Rinse BID  . cholecalciferol  5,000 Units Oral Daily  . docusate sodium  100 mg Oral BID  . guaiFENesin  600 mg Oral BID  . insulin aspart  0-9 Units Subcutaneous Q6H  . mouth rinse  15 mL Mouth Rinse q12n4p  . midodrine  5 mg Oral TID WC  . nicotine  14 mg Transdermal Daily  . polyethylene glycol  17 g Oral QODAY  . scopolamine  1 patch Transdermal Q72H  . sodium chloride flush  10-40 mL Intracatheter Q12H    Continuous Inpatient  Infusions:   . Marland KitchenTPN (CLINIMIX-E) Adult     And  . Fat emulsion    . piperacillin-tazobactam (ZOSYN)  IV 3.375 g (11/22/17 1205)  . Marland KitchenTPN (CLINIMIX-E) Adult 83 mL/hr at 11/22/17 0816    PRN Inpatient Medications:  acetaminophen **OR** acetaminophen, bisacodyl, haloperidol lactate, ipratropium-albuterol, ondansetron **OR** ondansetron (ZOFRAN) IV, phenol, sodium chloride flush  Family History: family history includes Diabetes in his mother.   GI Family History: Unknown  Social History:   reports that he has been smoking cigarettes. He has been smoking about 1.00 pack per day. He has never used smokeless tobacco. He reports that he does not drink alcohol or use drugs. The patient denies ETOH, tobacco, or drug use.    Review of Systems: Review of Systems - Unobtainable due to patient confusion  Physical Examination: BP 124/80 (BP Location: Left Arm)   Pulse 61   Temp 98.3 F (36.8 C) (Oral)   Resp 16   Ht 6\' 1"  (1.854 m)   Wt 81.6 kg   SpO2 100%   BMI 23.75 kg/m  Physical Exam  Constitutional: He appears cachectic. No distress.  Appears malnourished  HENT:  Head: Normocephalic and atraumatic.  Eyes: Conjunctivae and lids are normal.  Neck: No tracheal deviation present. No thyroid mass present.  Cardiovascular: Normal rate, S1 normal and normal pulses. Exam reveals no S3.  Pulmonary/Chest: He has rhonchi in the right lower field and the left lower field.  Abdominal: Soft. Normal appearance and bowel sounds are normal. He exhibits no distension and no ascites.  Musculoskeletal:       Right shoulder: He exhibits no swelling, no deformity and normal pulse.  Lymphadenopathy:    He has no cervical adenopathy.  Neurological: He is disoriented. He displays atrophy. He displays no tremor. No sensory deficit.  Skin: Skin is warm and dry. Capillary refill takes less than 2 seconds. No lesion noted.  Psychiatric: His speech is delayed and slurred. He is slowed. He is not actively  hallucinating. Thought content is delusional. Cognition and memory are impaired. He expresses inappropriate judgment. He expresses no suicidal plans.    Data: Lab Results  Component Value Date   WBC 3.6 (L) 11/22/2017   HGB 9.0 (L) 11/22/2017   HCT 25.7 (L) 11/22/2017   MCV 96.3 11/22/2017   PLT 84 (L) 11/22/2017   Recent Labs  Lab 11/20/17 0547 11/21/17 0440 11/22/17 0430  HGB 10.2* 9.5* 9.0*   Lab Results  Component Value Date   NA 140 11/21/2017   K 4.0 11/21/2017   CL 107 11/21/2017   CO2 27 11/21/2017   BUN 28 (H) 11/21/2017   CREATININE 1.10 11/21/2017   Lab Results  Component Value Date   ALT 29 11/19/2017   AST 36 11/19/2017   ALKPHOS 65 11/19/2017   BILITOT 0.6 11/19/2017   Recent  Labs  Lab 11/21/17 1149  INR 1.10   CBC Latest Ref Rng & Units 11/22/2017 11/21/2017 11/20/2017  WBC 3.8 - 10.6 K/uL 3.6(L) 2.7(L) 2.5(L)  Hemoglobin 13.0 - 18.0 g/dL 9.0(L) 9.5(L) 10.2(L)  Hematocrit 40.0 - 52.0 % 25.7(L) 27.3(L) 29.0(L)  Platelets 150 - 440 K/uL 84(L) 42(L) 38(L)    STUDIES: Ct Abdomen Pelvis W Contrast  Result Date: 11/22/2017 CLINICAL DATA:  Malnutrition. Elevated CEA level. Evaluation for PEG placement. EXAM: CT ABDOMEN AND PELVIS WITH CONTRAST TECHNIQUE: Multidetector CT imaging of the abdomen and pelvis was performed using the standard protocol following bolus administration of intravenous contrast. CONTRAST:  176mL ISOVUE-300 IOPAMIDOL (ISOVUE-300) INJECTION 61% COMPARISON:  Ultrasound 11/19/2017 FINDINGS: Lower chest: Bilateral effusions layering dependently with dependent pulmonary atelectasis. Non dependent lung is clear. Heart size is normal. Hepatobiliary: No abnormality of the liver parenchyma is seen. Insignificant 5 mm cyst in the posterior right lobe of the liver. Collapsed gallbladder. No calcified gallstones. Pancreas: Normal Spleen: Normal Adrenals/Urinary Tract: Adrenal glands are normal. Kidneys are normal. Bladder is normal. Stomach/Bowel: Large  amount of fecal matter throughout the colon. No primary small bowel pathology is seen. Vascular/Lymphatic: Aortic atherosclerosis. No aneurysm. IVC is normal. Reproductive: Normal Other: Small to moderate amount of ascites, freely distributed. Musculoskeletal: Ordinary lower lumbar degenerative changes. IMPRESSION: Pleural effusions layering dependently with dependent pulmonary atelectasis. Small to moderate amount of ascites freely distributed within the peritoneal space. Very large amount of stool throughout the colon. No obstructing lesion identified. No sign of inflammatory disease. No abdominal organ pathology evident. Electronically Signed   By: Nelson Chimes M.D.   On: 11/22/2017 15:02   @IMAGES @  Assessment: 1.  Aspiration pneumonia- patient with rhonchorous breath sounds as well as productive cough.  On IV antibiotics.  2.  Feeding problem- on TPN.  3.  Elevated serum CEA- this suggests possible malignancy and should be worked up.  4.  Altered mental status-patient is off antipsychotics at this time given his recent altered mental status with sedation and hypotension.  5.  Unintentional weight loss.  6.  Pancytopenia-Per hematology  Recommendations: 1.  Proceed with gastrostomy tube placement per interventional radiology as planned.  2.  Case was discussed with Dr. Jerelyn Charles.  Patient's current pulmonary status is not optimal for proceeding with endoluminal evaluation and this may  need to be scheduled in the outpatient setting.  3.  Will follow progress peripherally.   Thank you for the consult. Please call with questions or concerns.  Olean Ree, "Lanny Hurst MD [facility] [place], [place] [address] [phone]  11/22/2017 3:36 PM

## 2017-11-23 ENCOUNTER — Inpatient Hospital Stay: Payer: Medicaid Other

## 2017-11-23 LAB — COMPREHENSIVE METABOLIC PANEL
ALT: 41 U/L (ref 0–44)
ANION GAP: 5 (ref 5–15)
AST: 45 U/L — ABNORMAL HIGH (ref 15–41)
Albumin: 2.4 g/dL — ABNORMAL LOW (ref 3.5–5.0)
Alkaline Phosphatase: 62 U/L (ref 38–126)
BUN: 23 mg/dL — ABNORMAL HIGH (ref 6–20)
CHLORIDE: 107 mmol/L (ref 98–111)
CO2: 32 mmol/L (ref 22–32)
CREATININE: 1.14 mg/dL (ref 0.61–1.24)
Calcium: 9.3 mg/dL (ref 8.9–10.3)
Glucose, Bld: 69 mg/dL — ABNORMAL LOW (ref 70–99)
Potassium: 4.8 mmol/L (ref 3.5–5.1)
Sodium: 144 mmol/L (ref 135–145)
Total Bilirubin: 0.7 mg/dL (ref 0.3–1.2)
Total Protein: 6 g/dL — ABNORMAL LOW (ref 6.5–8.1)

## 2017-11-23 LAB — CULTURE, BLOOD (ROUTINE X 2)
CULTURE: NO GROWTH
CULTURE: NO GROWTH
SPECIAL REQUESTS: ADEQUATE
Special Requests: ADEQUATE

## 2017-11-23 LAB — COMP PANEL: LEUKEMIA/LYMPHOMA

## 2017-11-23 LAB — GLUCOSE, CAPILLARY
GLUCOSE-CAPILLARY: 152 mg/dL — AB (ref 70–99)
Glucose-Capillary: 110 mg/dL — ABNORMAL HIGH (ref 70–99)
Glucose-Capillary: 85 mg/dL (ref 70–99)
Glucose-Capillary: 90 mg/dL (ref 70–99)

## 2017-11-23 LAB — PHOSPHORUS: PHOSPHORUS: 3.9 mg/dL (ref 2.5–4.6)

## 2017-11-23 LAB — MAGNESIUM: MAGNESIUM: 1.8 mg/dL (ref 1.7–2.4)

## 2017-11-23 MED ORDER — BISACODYL 10 MG RE SUPP
10.0000 mg | Freq: Once | RECTAL | Status: AC
  Administered 2017-11-23: 10 mg via RECTAL
  Filled 2017-11-23: qty 1

## 2017-11-23 MED ORDER — POLYETHYLENE GLYCOL 3350 17 G PO PACK: 17.0000 g | PACK | Freq: Every day | ORAL | Status: DC

## 2017-11-23 MED ORDER — TRACE MINERALS CR-CU-MN-SE-ZN 10-1000-500-60 MCG/ML IV SOLN
INTRAVENOUS | Status: DC
  Administered 2017-11-23: 18:00:00 via INTRAVENOUS
  Filled 2017-11-23: qty 1992

## 2017-11-23 MED ORDER — FAT EMULSION PLANT BASED 20 % IV EMUL
250.0000 mL | INTRAVENOUS | Status: DC
  Administered 2017-11-23: 250 mL via INTRAVENOUS
  Filled 2017-11-23: qty 250

## 2017-11-23 NOTE — Clinical Social Work Note (Signed)
CSW spoke at length with patient's sister, Olegario Shearer, and the family care home owner. CSW provided supportive listening as patient's sister processed the choices ahead of her. If patient does not get a PEG, patient's family care home owner, Shirlean Mylar, will take patient back at discharge. If PEG, cSW will continue bed search. Shela Leff MSW,LCSW [phone]

## 2017-11-23 NOTE — Progress Notes (Signed)
CCMD called several times regarding connectivity issues.  Patient's electrodes, leads, telemetry box, and battery checked, changed, and replace separately by both nurse tech and RN.  RN informed CCMD and charge nurse. Continue to monitor connectivity.

## 2017-11-23 NOTE — Progress Notes (Signed)
[place] [facility] [place] NAME: Austin Gates    MR#:  [id number]  DATE OF BIRTH:  02-23-1960  SUBJECTIVE:  patient at baseline remains somewhat confused although pleasantly. Mentation much improved after discontinuing psych meds. Awaiting interventional radiology to place peg tube. No family in the room. No issues per RN  REVIEW OF SYSTEMS:   Review of Systems  Constitutional: Negative for chills, fever and weight loss.  HENT: Negative for ear discharge, ear pain and nosebleeds.   Eyes: Negative for blurred vision, pain and discharge.  Respiratory: Negative for sputum production, shortness of breath, wheezing and stridor.   Cardiovascular: Negative for chest pain, palpitations, orthopnea and PND.  Gastrointestinal: Negative for abdominal pain, diarrhea, nausea and vomiting.  Genitourinary: Negative for frequency and urgency.  Musculoskeletal: Negative for back pain and joint pain.  Neurological: Negative for sensory change, speech change, focal weakness and weakness.  Psychiatric/Behavioral: Negative for depression and hallucinations. The patient is not nervous/anxious.    Tolerating Diet:npo Tolerating PT:   DRUG ALLERGIES:  No Known Allergies  VITALS:  Blood pressure 122/77, pulse (!) 49, temperature 98.2 F (36.8 C), temperature source Oral, resp. rate 20, height 6\' 1"  (1.854 m), weight 75.8 kg, SpO2 100 %.  PHYSICAL EXAMINATION:   Physical Exam  GENERAL:  57 y.o.-year-old patient lying in the bed with no acute distress. Thin cachectic EYES: Pupils equal, round, reactive to light and accommodation. No scleral icterus. Extraocular muscles intact.  HEENT: Head atraumatic, normocephalic. Oropharynx and nasopharynx clear.  NECK:  Supple, no jugular venous distention. No thyroid enlargement, no tenderness.  LUNGS: Normal breath sounds bilaterally, no wheezing, rales, rhonchi. No use of accessory muscles of respiration.   CARDIOVASCULAR: S1, S2 normal. No murmurs, rubs, or gallops.  ABDOMEN: Soft, nontender, nondistended. Bowel sounds present. No organomegaly or mass.  EXTREMITIES: No cyanosis, clubbing or edema b/l.    NEUROLOGIC: Cranial nerves II through XII are intact. No focal Motor or sensory deficits b/l.   PSYCHIATRIC:  patient is alert and alert. Pleasantly confused SKIN: No obvious rash, lesion, or ulcer.   LABORATORY PANEL:  CBC Recent Labs  Lab 11/22/17 0430  WBC 3.6*  HGB 9.0*  HCT 25.7*  PLT 84*    Chemistries  Recent Labs  Lab 11/23/17 0450  NA 144  K 4.8  CL 107  CO2 32  GLUCOSE 69*  BUN 23*  CREATININE 1.14  CALCIUM 9.3  MG 1.8  AST 45*  ALT 41  ALKPHOS 62  BILITOT 0.7   Cardiac Enzymes No results for input(s): TROPONINI in the last 168 hours. RADIOLOGY:  Ct Abdomen Pelvis W Contrast  Result Date: 11/22/2017 CLINICAL DATA:  Malnutrition. Elevated CEA level. Evaluation for PEG placement. EXAM: CT ABDOMEN AND PELVIS WITH CONTRAST TECHNIQUE: Multidetector CT imaging of the abdomen and pelvis was performed using the standard protocol following bolus administration of intravenous contrast. CONTRAST:  125mL ISOVUE-300 IOPAMIDOL (ISOVUE-300) INJECTION 61% COMPARISON:  Ultrasound 11/19/2017 FINDINGS: Lower chest: Bilateral effusions layering dependently with dependent pulmonary atelectasis. Non dependent lung is clear. Heart size is normal. Hepatobiliary: No abnormality of the liver parenchyma is seen. Insignificant 5 mm cyst in the posterior right lobe of the liver. Collapsed gallbladder. No calcified gallstones. Pancreas: Normal Spleen: Normal Adrenals/Urinary Tract: Adrenal glands are normal. Kidneys are normal. Bladder is normal. Stomach/Bowel: Large amount of fecal matter throughout the colon. No primary small bowel pathology is seen. Vascular/Lymphatic: Aortic atherosclerosis. No aneurysm. IVC is normal. Reproductive: Normal  Other: Small to moderate amount of ascites, freely  distributed. Musculoskeletal: Ordinary lower lumbar degenerative changes. IMPRESSION: Pleural effusions layering dependently with dependent pulmonary atelectasis. Small to moderate amount of ascites freely distributed within the peritoneal space. Very large amount of stool throughout the colon. No obstructing lesion identified. No sign of inflammatory disease. No abdominal organ pathology evident. Electronically Signed   By: Nelson Chimes M.D.   On: 11/22/2017 15:02   ASSESSMENT AND PLAN:  Austin Gates  is a 57 y.o. male with a known history of tobacco abuse, psychiatric disorder and mental retardation.  Patient lives in an assisted living. He is not able to provide reliable history due to his mental status. Patient was sent to emergency room for increased drowsiness, confusion, generalized weakness and low blood pressure.  Most of his symptoms have been going on for the past 5 to 7 days, gradually getting worse.   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 to maintain body weight -Speech therapy input appreciated-failed pured diet with nectar thickened liquids - continue TPN, for IR placed PEG tube today or tomorrow - status post platelet transfusion given thrombocytopenia prior to procedure November 21, 2017  *Acute on chronic moderate to severe protein calorie/energy malnutrition continue TPN, follow-up on CEA elevated at 11.1, AFP- nl, CA 19-9 nl, PEG tube placement as stated above  *Pancytopenia Stable Oncology  input appreciated Most likely related to psychotropic medications Abdominal ultrasound unimpressive, oncology/hematology input appreciated, anemia work-up consistent with iron deficiency-treated with IV iron, status post platelet transfusion given thrombocytopenia-4 planned Peg-tube placement -CT of the abdomen negative except constipation.   *Acute elevated CEA level -CT abdomen/pelvis-- for any mass in the intense time, gastroenterology with Dr. Alice Reichert appreciated.  -Patient currently finished treatment with aspiration pneumonia. - G.I. workup with luminal evaluation will be done as outpatient.  *Acute kidney injury Resolved with IV fluids for rehydration  *Acute bilateral aspiration pneumonia, present on admission  Resolving Secondary to encephalopathy and dysphasia Chest x-rays were always inconclusive, CT of the chest noted for bilateral pneumonia - patient completed Zosyn  course, aspiration precautions  *Schizoaffective/bipolar disorder Psychiatry input appreciated  Psychotropic medications were discontinued which resulted in resolution of encephalopathy     *Tobacco smoking abuse/dependency  Stable Nicotine patch and cessation counseling ordered  *discharge planning to rehab/snF after PEG  Placement  Case discussed with Care Management/Social Worker. Management plans discussed with the patient, family and they are in agreement.  CODE STATUS:full  DVT Prophylaxis: scd (two thrombocytopenia)  TOTAL TIME TAKING CARE OF THIS PATIENT: *30* minutes.  >50% time spent on counselling and coordination of care  POSSIBLE D/C IN **1-2 DAYS, DEPENDING ON CLINICAL CONDITION.  Note: This dictation was prepared with Dragon dictation along with smaller phrase technology. Any transcriptional errors that result from this process are unintentional.  Fritzi Mandes M.D on 11/23/2017 at 8:43 AM  Between 7am to 6pm - Pager - [phone]  After 6pm go to [email or web address] - password [email or web address]  [facility]  Office  [phone]  CC: Primary care physician; Raelyn Number, MDPatient ID: [name], male  DOB: Apr 18, 1960, 57 y.o.   MRN: [id number]

## 2017-11-23 NOTE — Progress Notes (Signed)
Physical Therapy Treatment Patient Details Name: Austin Gates MRN: [id number] DOB: 1960/09/29 Today's Date: 11/23/2017    History of Present Illness Pt is a 57 y.o. male with a known history of tobacco abuse, psychiatric disorder and mental retardation.  Patient lives in an assisted living. He is not able to provide reliable history due to his mental status. Patient was sent to emergency room for increased drowsiness, confusion, generalized weakness and low blood pressure.  Most of his symptoms have been going on for the past 5 to 7 days, gradually getting worse.  No reports of fever, chills, nausea, vomiting, diarrhea, bleeding.  In the emergency room, patient was noted with systolic blood pressure in 70s.  This finally improved after 4 L of fluids to 80s.  Blood test done emergency room were notable for sodium level of 148, creatinine level 1.57 and BUN  44.  UA was negative for UTI and chest x-ray was negative for acute cardiopulmonary disease.  Abdominal x-ray shows large stool burden. Patient is admitted for further evaluation and treatment.  Assessment includes: Acute renal failure, likely prerenal, related to dehydration and possible side effects from medications, Acute encephalopathy, hypotension, and constipation.     PT Comments    Pt awake and agrees to session.  Pt found to be inc of large amt of urine saturating bed.  Nurse tech in to assist with full bed change and bathing as needed.  Pt without difficulty rolling left and right.  Pt was able to sit EOB without assist.  He remained sitting x 10 minutes while completing exercises.  He was able to stand without physical assist but +2 for safety.  He then continued to walk 100' with walker and min a x 1 with +1 assisting with IV pole and +1 for chair follow.  Pt with irregular gait pattern, speed and overall decreased balance.  While he remains at a high risk for falls he overall did quite well.  Upon return to room and transfer to chair, he  stood very quickly and turned to bed.  He required +2 assist to safely guide him to bed as he tried to sit before turning fully.  Upon sitting,he quickly returned to supine despite cues to wait for IV pole to be brought back to the other side of the bed and it became unhooked.  Primary nurse in to address.  Pt was returned to bed per nurse request for suppository.    Pt should have +2 assist at all times due to pt and staff safety due to impulsivity and balance  affecting safety.       Follow Up Recommendations  SNF     Equipment Recommendations  Rolling walker with 5" wheels;Other (comment)    Recommendations for Other Services       Precautions / Restrictions Precautions Precautions: Fall Precaution Comments: Watch for low BP Restrictions Weight Bearing Restrictions: No    Mobility  Bed Mobility Overal bed mobility: Needs Assistance Bed Mobility: Supine to Sit;Sit to Supine;Rolling Rolling: Modified independent (Device/Increase time)   Supine to sit: Min guard Sit to supine: Min guard Sit to sidelying: Min guard General bed mobility comments: pt able to do without assist, very impuslive and needs assist for safety.  Transfers Overall transfer level: Needs assistance Equipment used: Rolling walker (2 wheeled) Transfers: Sit to/from Stand Sit to Stand: Min guard;+2 safety/equipment         General transfer comment: due to impulsive and decreased safety  Ambulation/Gait Ambulation/Gait assistance: Min  assist;+2 safety/equipment Gait Distance (Feet): 100 Feet Assistive device: Rolling walker (2 wheeled) Gait Pattern/deviations: Step-through pattern;Trunk flexed Gait velocity: varried   General Gait Details: Very irregular step pattern and speed, poor awarenss and general safety.   Requires +2 assist at all times for pt and staff safety   Stairs             Wheelchair Mobility    Modified Rankin (Stroke Patients Only)       Balance Overall balance  assessment: Needs assistance Sitting-balance support: Bilateral upper extremity supported;Feet supported Sitting balance-Leahy Scale: Fair Sitting balance - Comments: able to hold position x 10 minutes with tx without support but supervision for safety   Standing balance support: Bilateral upper extremity supported Standing balance-Leahy Scale: Poor Standing balance comment: general instability in standing requiring occasional assist to prevent LOB                            Cognition Arousal/Alertness: Awake/alert Behavior During Therapy: WFL for tasks assessed/performed Overall Cognitive Status: History of cognitive impairments - at baseline                                 General Comments: Follows 1 step command inconsistently      Exercises Other Exercises Other Exercises: seated LAQ, marches, ab/add and ankle pumps x 2 x 10  Other Exercises: inc large amt of urine, care provided and full bed change with nurse tech    General Comments        Pertinent Vitals/Pain Pain Assessment: No/denies pain    Home Living                      Prior Function            PT Goals (current goals can now be found in the care plan section) Progress towards PT goals: Progressing toward goals    Frequency    Min 2X/week      PT Plan Current plan remains appropriate    Co-evaluation              AM-PAC PT "6 Clicks" Daily Activity  Outcome Measure  Difficulty turning over in bed (including adjusting bedclothes, sheets and blankets)?: None Difficulty moving from lying on back to sitting on the side of the bed? : None Difficulty sitting down on and standing up from a chair with arms (e.g., wheelchair, bedside commode, etc,.)?: A Little Help needed moving to and from a bed to chair (including a wheelchair)?: A Lot Help needed walking in hospital room?: A Lot Help needed climbing 3-5 steps with a railing? : A Lot 6 Click Score: 17     End of Session Equipment Utilized During Treatment: Gait belt Activity Tolerance: Patient tolerated treatment well;Patient limited by fatigue Patient left: in bed;with bed alarm set;with nursing/sitter in room;with call bell/phone within reach Nurse Communication: Mobility status       Time: 8756-4332 PT Time Calculation (min) (ACUTE ONLY): 39 min  Charges:  $Gait Training: 8-22 mins $Therapeutic Exercise: 8-22 mins $Therapeutic Activity: 8-22 mins                     Chesley Noon, PTA 11/23/17, 10:33 AM

## 2017-11-23 NOTE — Evaluation (Addendum)
Objective Swallowing Evaluation: Type of Study: MBS-Modified Barium Swallow Study   Patient Details  Name: Austin Gates MRN: [id number] Date of Birth: 20-Jul-1960  Today's Date: 11/23/2017 Time: SLP Start Time (ACUTE ONLY): 1430 -SLP Stop Time (ACUTE ONLY): 1530  SLP Time Calculation (min) (ACUTE ONLY): 60 min   Past Medical History:  Past Medical History:  Diagnosis Date  . Diabetes mellitus without complication ([place])   . Hypertension    Past Surgical History:  Past Surgical History:  Procedure Laterality Date  . KNEE ARTHROSCOPY     HPI: Pt is a 57 y.o. male with a known history of Schizoaffective disorder, bipolar disorder, mental retardation, tobacco abuse(ongoing now). Per chart notes, pt is Malnurished. Patient lives in a skilled setting, Group Home. Patient was sent to emergency room for increased drowsiness, confusion, generalized weakness and low blood pressure; dehydration.  Most of his symptoms have been going on for the past 5 to 7 days, gradually getting worse.  No reports of fever, chills, nausea, vomiting, diarrhea, bleeding.  Apparently, per ALF report, patient has been losing weight in the past few months, despite the fact that he still has a good appetite.  MD felt there could be an impact of too many medications.  Pt was initially started on TPN for nutritional support but has been NPO. MRI revelaed BILATERAL 5 mm thick subdural hygromas developed since the previous CT scan of 11/06/2017.    Subjective: pt awake, engaged w/ SLP and staff. He verbalized w/ short responses and asked if he could get something to eat/drink    Assessment / Plan / Recommendation  CHL IP CLINICAL IMPRESSIONS 11/23/2017  Clinical Impression During this MBSS today, pt presented w/ Moderate-Severe oropharyngeal phase dysphagia and is at increased risk for Pulmonary decline d/t noted Silent Penetration and Aspiration w/ oral intake. Pt has a baseline of Schizoaffective disorder, bipolar  disorder, mental retardation and requires assistance/monitoring w/ ADLs. Pt exhibited impulsive behavior which Sister reported as baseline for pt as well especially during eating/drinking "at home". During the Oral phase, min impulsive w/ min decreased bolus control when swallowing - quick A-P transfer w/ quick movement of bolus material into the pharynx. During the Pharyngeal phase, delayed pharyngeal swallow initiation w/ all consistencies given; Nectar and Honey consistency liquids spilled from the Valleculae to the Pyriform Sinuses when triggering the pharyngeal swallow and Puree consistency appeared to trigger the pharyngeal swallow at the Valleculae. Pt exhibited decreased, tight airway closure during the height of laryngeal excursion thus allowing for airway compromise - noted bolus residue to build along the underneath side of the Epiglottis that then moved into the Laryngeal Vestibule w/ thin line of Aspiration building below the vocal cords - this was Silent.  Pt exhibited a Moderate amount of pharyngeal residue post initial swallow in both the Valleculae and Pyriform Sinuses indicating decreased pharyngeal pressure and laryngeal excursion during the swallow. This residue decreased slightly w/ f/u, dry swallows (independent swallows) but did not clear. Pt could not follow through w/ verbal instruction/strategies of dry swallow, effortful swallow, or cough in attempt to address pharyngeal residue and laryngeal penetration. This increases pt's risk for Aspiration POST the swallow. During the Esophageal phase, noted bolus Stasis in the upper-mid Esophagus w/ slow clearing which could increase risk for Regurgitation of food/liquid material.  These results were discussed w/ pt/Sister, MD/NSG, and Palliative Care NP who will meet further w/ the pt/Sister re: Sims. The option of a PEG placement has been presented to the Sister/pt.  SLP Visit Diagnosis Dysphagia, oropharyngeal phase (R13.12)  Attention and  concentration deficit following --  Frontal lobe and executive function deficit following --  Impact on safety and function Moderate aspiration risk;Severe aspiration risk;Risk for inadequate nutrition/hydration      CHL IP TREATMENT RECOMMENDATION 11/23/2017  Treatment Recommendations (No Data)     Prognosis 11/23/2017  Prognosis for Safe Diet Advancement Guarded  Barriers to Reach Goals Cognitive deficits;Severity of deficits  Barriers/Prognosis Comment --    CHL IP DIET RECOMMENDATION 11/23/2017  SLP Diet Recommendations NPO  Liquid Administration via --  Medication Administration Via alternative means  Compensations --  Postural Changes --      CHL IP OTHER RECOMMENDATIONS 11/23/2017  Recommended Consults (No Data)  Oral Care Recommendations Oral care QID;Staff/trained caregiver to provide oral care  Other Recommendations --      CHL IP FOLLOW UP RECOMMENDATIONS 11/23/2017  Follow up Recommendations Skilled Nursing facility      [facility] IP FREQUENCY AND DURATION 11/23/2017  Speech Therapy Frequency (ACUTE ONLY) (No Data)  Treatment Duration (No Data)           CHL IP ORAL PHASE 11/23/2017  Oral Phase Impaired  Oral - Pudding Teaspoon NT  Oral - Pudding Cup --  Oral - Honey Teaspoon 1 trial  Oral - Honey Cup 2 trials  Oral - Nectar Teaspoon 1 trial  Oral - Nectar Cup 1 trial  Oral - Nectar Straw --  Oral - Thin Teaspoon NT  Oral - Thin Cup --  Oral - Thin Straw --  Oral - Puree 2 trials  Oral - Mech Soft NT  Oral - Regular NT  Oral - Multi-Consistency --  Oral - Pill NT  Oral Phase - Comment min impulsive w/ min decreased bolus control when swallowing - quick A-P transfer w/ quick movement of bolus material into the pharynx.     CHL IP PHARYNGEAL PHASE 11/23/2017  Pharyngeal Phase Impaired  Pharyngeal- Pudding Teaspoon NT  Pharyngeal --  Pharyngeal- Pudding Cup --  Pharyngeal --  Pharyngeal- Honey Teaspoon 1 trial  Pharyngeal --  Pharyngeal- Honey Cup 2 trials   Pharyngeal --  Pharyngeal- Nectar Teaspoon 1 trial  Pharyngeal --  Pharyngeal- Nectar Cup 1 trial  Pharyngeal --  Pharyngeal- Nectar Straw --  Pharyngeal --  Pharyngeal- Thin Teaspoon NT  Pharyngeal --  Pharyngeal- Thin Cup --  Pharyngeal --  Pharyngeal- Thin Straw --  Pharyngeal --  Pharyngeal- Puree 2 trials  Pharyngeal --  Pharyngeal- Mechanical Soft NT  Pharyngeal --  Pharyngeal- Regular NT  Pharyngeal --  Pharyngeal- Multi-consistency --  Pharyngeal --  Pharyngeal- Pill NT  Pharyngeal --  Pharyngeal Comment delayed pharyngeal swallow initiation w/ all consistencies given; Nectar and Honey consistency liquids spilled from the Valleculae to the Pyriform Sinuses when triggering the pharyngeal swallow and Puree consistency appeared to trigger the pharyngeal swallow at the Valleculae. Pt exhibited decreased, tight airway closure during the height of laryngeal excursion thus allowing for airway compromise - noted bolus residue to build along the underneath side of the Epiglottis that then moved into the Laryngeal Vestibule w/ thin line of Aspiration building below the vocal cords - this was Silent.  pt exhibited a Moderate amount of pharyngeal residue post initial swallow in both the Valleculae and Pyriform Sinuses indicating decreased pharyngeal pressure and laryngeal excursion during the swallow. This residue decreased slightly w/ f/u, dry swallows (independent swallows). Pt could not follow through w/ verbal instruction/strategies of dry swallow, effortful swallow, or  cough in attempt to address pharyngeal residue and laryngeal penetration.      CHL IP CERVICAL ESOPHAGEAL PHASE 11/23/2017  Cervical Esophageal Phase Impaired  Pudding Teaspoon --  Pudding Cup --  Honey Teaspoon --  Honey Cup --  Nectar Teaspoon --  Nectar Cup --  Nectar Straw --  Thin Teaspoon --  Thin Cup --  Thin Straw --  Puree --  Mechanical Soft --  Regular --  Multi-consistency --  Pill --  Cervical  Esophageal Comment noted bolus Stasis in the upper-mid Esophagus w/ slow clearing       Orinda Kenner, MS, CCC-SLP Scotti Kosta 11/23/2017, 4:03 PM

## 2017-11-23 NOTE — Progress Notes (Signed)
PHARMACY - ADULT TOTAL PARENTERAL NUTRITION CONSULT NOTE   Pharmacy Consult for TPN Indication: malnutrition  Patient Measurements: Height: 6\' 1"  (185.4 cm) Weight: 167 lb (75.8 kg) IBW/kg (Calculated) : 79.9 TPN AdjBW (KG): 65.8 Body mass index is 22.03 kg/m.  Assessment: 57 yom cc leg pain. Patient's family is concerned for swallowing problems and malnutrition. Dr Posey Pronto is planning to put in a PEG tube tomorrow but the patient will continue to need the TPN for at least another 48 hours. PEG tube is scheduled for placement tomorrow evening.   GI: Dysphagia and aspiration while attemting to eat Endo: Patient has a documented history of diabetes but doesn't have any anti-hyperglycemics recorded on his medication list.  Insulin requirements in the past 24 hours: 0 units SSI Lytes: Mg: 1.8, Phos: 3.9, K: 4.8  Will follow for possibility of refeeding per TPN protocol Renal: SCr 1.14 mg/dL, CrCl 79 mL/min Pulm:  Cards:  Hepatobil: Neuro: Patient is alert but not talking or eating. EEG with slowing, Neurology consulted and suspects some degree of metabolic encephalopathy due to psychiatric medications - psych is following. Patient has a history of schizoaffective disorder and bipolar disorder for which he takes clozapine and Depakote, both of which have been discontinued for pancytopenia ID: Zosyn for aspiration PNA: treatment complete TPN Access: PICC placement 9/28 TPN start date:  11/18/17 Nutritional Goals (per RD recommendation on 11/19/17): 2233 kcal, 100 grams of protein, 2232 mL fluid daily including lipids.  Current Nutrition: TPN  Plan:  Continue goal TPN regimen of Clinimix E 5/20 at 83 mL/hr + 20% ILE at 20 mL/hr over 12 hours.  Add MVI, trace elements  Sensitive SSI ordered., adjust as needed  Monitor TPN labs, per protocol  If PEG is placed tomorrow the rate will need to be adjusted.   Dallie Piles, Pharm.D Clinical Pharmacist 11/23/2017,11:31 AM

## 2017-11-23 NOTE — Progress Notes (Signed)
Nutrition Follow Up Note   DOCUMENTATION CODES:   Severe malnutrition in context of chronic illness  INTERVENTION:   Continue TPN at goal rate until pt able to have IR G-tube placed. On the day of G-tube placement can decrease TPN rate to 51m/hr and plan to discontinue the following day.   Once G-tube placed and ready for use recommend:  Jevity 1.5 @ goal rate of 68mhr- Initiate at 3052mr and increase by 14m83m every 8 hrs until goal rate is reached   Free water flushes 150ml62m hours   Regimen provides 2160kcal/day, 92g/day protein, 32g/day fiber, 1994ml/85mfree water    NUTRITION DIAGNOSIS:   Severe Malnutrition related to chronic illness(etiology unknown at this time) as evidenced by severe fat depletion, moderate muscle depletion, severe muscle depletion.  GOAL:   Patient will meet greater than or equal to 90% of their needs  -met with TPN  MONITOR:   Diet advancement, Labs, Weight trends, I & O's, TPN  ASSESSMENT:   57 yea67old male with PMHx of schizoaffective/bipolar disorder, DM, HTN who is admitted with acute toxic metabolic encephalopathy, acute dysphagia, aphagia, pancytopenia, AKI.   Pt tolerating TPN well; awaiting IR G-tube placement today or tomorrow. Will plan to continue TPN at goal rate until G-tube is able to be placed. On the day of G-tube placement can decrease TPN rate to 40ml/h36md plan to discontinue the following day. Tube feeds recommendations above once G-tube ready for use. Pt with ~24lb weight gain since admit but weight is trending down now; suspect this will improve after discontinuation of TPN.   Medications reviewed and include: dulcolax, vitamin D, colace, insulin, nicotine, miralax  Labs reviewed: K 4.8 wnl, P 3.9 wnl, Mg 1.8 wnl  Diet Order:   Diet Order            Diet NPO time specified Except for: Sips with Meds  Diet effective now             EDUCATION NEEDS:   Not appropriate for education at this time  Skin:  Skin  Assessment: Reviewed RN Assessment  Last BM:  10/2- type 4   Height:   Ht Readings from Last 1 Encounters:  11/15/17 '6\' 1"'  (1.854 m)    Weight:   Wt Readings from Last 1 Encounters:  11/23/17 75.8 kg    Ideal Body Weight:  83.6 kg  BMI:  Body mass index is 22.03 kg/m.  Estimated Nutritional Needs:   Kcal:  1975-2300 (MSJ x 1.2-1.4)  Protein:  90-105 grams (1.2-1.4 grams/kg)  Fluid:  2-2.3 L/day (1 mL/kcal)  Lorree Millar CKoleen Distance, LDN Pager #- [phone]#- [phone] Pager: [phone]

## 2017-11-24 ENCOUNTER — Other Ambulatory Visit: Payer: Self-pay | Admitting: *Deleted

## 2017-11-24 ENCOUNTER — Other Ambulatory Visit: Payer: Self-pay

## 2017-11-24 LAB — GLUCOSE, CAPILLARY
GLUCOSE-CAPILLARY: 122 mg/dL — AB (ref 70–99)
GLUCOSE-CAPILLARY: 66 mg/dL — AB (ref 70–99)
GLUCOSE-CAPILLARY: 92 mg/dL (ref 70–99)
Glucose-Capillary: 84 mg/dL (ref 70–99)

## 2017-11-24 MED ORDER — DIVALPROEX SODIUM 500 MG PO DR TAB: 1000.0000 mg | DELAYED_RELEASE_TABLET | Freq: Every day | ORAL | 0 refills | Status: DC

## 2017-11-24 MED ORDER — CLOZAPINE 100 MG PO TABS: 100.0000 mg | ORAL_TABLET | Freq: Every day | ORAL | 0 refills | Status: DC

## 2017-11-24 NOTE — Discharge Summary (Addendum)
[facility] NAME: Austin Gates    MR#:  [id number]  DATE OF BIRTH:  10/12/1960  DATE OF ADMISSION:  11/15/2017 ADMITTING PHYSICIAN: Amelia Jo, MD  DATE OF DISCHARGE: 11/24/2017  PRIMARY CARE PHYSICIAN: Raelyn Number, MD    ADMISSION DIAGNOSIS:  Abdominal distention [R14.0] Hypotension, unspecified hypotension type [I95.9]  DISCHARGE DIAGNOSIS:  -acute metabolic encephalopathy suspected due to psych medications. Patient is doing better and off psychotropic meds -dysphagia with high risk of aspiration. Severe protein calorie malnutrition schizoaffective disorder SECONDARY DIAGNOSIS:   Past Medical History:  Diagnosis Date  . Diabetes mellitus without complication ([place])   . Hypertension     HOSPITAL COURSE:   GregoryBaldwinis a72 y.o.malewith a known history oftobacco abuse, psychiatric disorder and mental retardation.Patient lives in an assisted living. He is not able to provide reliable history due to his mental status. Patient was sent to emergency room for increased drowsiness, confusion, generalized weakness and low blood pressure. Most of his symptoms have been going on for the past 5 to 7 days, gradually getting worse.   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 to maintain body weight -Speech therapy input appreciated-failed pured diet with nectar thickened liquids--failed trial again yday -received IV  TPN, -initial plan was to get back to placement how were patient's sisters and healthcare power of attorney decided not to get peg tube placed and want patient to have food for pleasure.  -With patient's sister [name]. -Both the sisters understand patient is at a high risk of aspiration, respiratory distress, cardiac arrest. There aware of it voiced understanding. They want patient to be full code. - status postplatelet transfusion given thrombocytopenia prior to procedure October1, 2019  *Acute on chronic moderate to severe protein calorie/energy malnutrition received TPN, follow-up on CEAelevated at 11.1, AFP- nl, CA 19-9nl, -PEG tube placement was cancelled  *Pancytopenia Oncology input appreciated Most likely related to psychotropic medications Abdominal ultrasound unimpressive, oncology/hematology input appreciated, anemia work-up consistent with iron deficiency-treated with IV iron,status post platelet transfusion given thrombocytopenia -CT of the abdomen negative except constipation.   *Acute elevated CEA level -CT abdomen/pelvis-- for any mass in the intense time, gastroenterology with Dr. Alice Reichert appreciated.  -Patient currently finished treatment with aspiration pneumonia. - G.I. workup with luminal evaluation will be done as outpatient.  *Acute kidney injury Resolved with IV fluids for rehydration  *Acute bilateral aspiration pneumonia, present on admission Resolving Secondary to encephalopathy and dysphasia Chest x-rays were always inconclusive, CT of the chest noted for bilateral pneumonia - patient completed Zosyn  -cont aspiration precautions  *Schizoaffective/bipolar disorder Psychiatry input appreciated  Psych meds resumed by Dr clpapcs *Tobacco smoking abuse/dependency  Nicotine patch and cessation counseling ordered  Discharge back to the facility. This was discussed with case Freight forwarder, Education officer, museum, patient's two sisters.  CONSULTS  OBTAINED:  Treatment Team:  [name], [name], MD Catarina Hartshorn, MD Alexis Goodell, MD Earlie Server, MD  DRUG ALLERGIES:  No Known Allergies  DISCHARGE MEDICATIONS:   Allergies as of 11/24/2017   No Known Allergies  Medication List    STOP taking these medications   desmopressin 0.2 MG tablet Commonly known as:  DDAVP   divalproex 500 MG 24 hr tablet Commonly known as:  DEPAKOTE ER Replaced by:  divalproex 500 MG DR tablet   hydrALAZINE 25 MG tablet Commonly known as:  APRESOLINE   metoprolol tartrate 25 MG tablet Commonly known as:  LOPRESSOR   polyethylene glycol packet Commonly known as:  MIRALAX / GLYCOLAX     TAKE these medications   cloZAPine 100 MG tablet Commonly known as:  CLOZARIL Take 1 tablet (100 mg total) by mouth 2 (two) times daily. What changed:  Another medication with the same name was changed. Make sure you understand how and when to take each.   cloZAPine 100 MG tablet Commonly known as:  CLOZARIL Take 1 tablet (100 mg total) by mouth at bedtime. What changed:  how much to take   divalproex 500 MG DR tablet Commonly known as:  DEPAKOTE Take 2 tablets (1,000 mg total) by mouth at bedtime. Replaces:  divalproex 500 MG 24 hr tablet   docusate sodium 100 MG capsule Commonly known as:  COLACE Take 1 capsule (100 mg total) by mouth 2 (two) times daily.   ibuprofen 600 MG tablet Commonly known as:  ADVIL,MOTRIN Take 1 tablet (600 mg total) by mouth every 8 (eight) hours as needed.   Vitamin D-3 5000 units Tabs Take 1 tablet by mouth daily.       If you experience worsening of your admission symptoms, develop shortness of breath, life threatening emergency, suicidal or homicidal thoughts you must seek medical attention immediately by calling 911 or calling your MD immediately  if symptoms less severe.  You Must read complete instructions/literature along with all the possible adverse reactions/side effects for all the Medicines you  take and that have been prescribed to you. Take any new Medicines after you have completely understood and accept all the possible adverse reactions/side effects.   Please note  You were cared for by a hospitalist during your hospital stay. If you have any questions about your discharge medications or the care you received while you were in the hospital after you are discharged, you can call the unit and asked to speak with the hospitalist on call if the hospitalist that took care of you is not available. Once you are discharged, your primary care physician will handle any further medical issues. Please note that NO REFILLS for any discharge medications will be authorized once you are discharged, as it is imperative that you return to your primary care physician (or establish a relationship with a primary care physician if you do not have one) for your aftercare needs so that they can reassess your need for medications and monitor your lab values.  DATA REVIEW:   CBC  Recent Labs  Lab 11/22/17 0430  WBC 3.6*  HGB 9.0*  HCT 25.7*  PLT 84*    Chemistries  Recent Labs  Lab 11/23/17 0450  NA 144  K 4.8  CL 107  CO2 32  GLUCOSE 69*  BUN 23*  CREATININE 1.14  CALCIUM 9.3  MG 1.8  AST 45*  ALT 41  ALKPHOS 62  BILITOT 0.7    Microbiology Results   Recent Results (from the past 240 hour(s))  CULTURE, BLOOD (ROUTINE X 2) w Reflex to ID Panel     Status: None   Collection Time: 11/18/17  7:15 AM  Result Value Ref Range Status   Specimen  Description BLOOD LEFT AC  Final   Special Requests   Final    BOTTLES DRAWN AEROBIC AND ANAEROBIC Blood Culture adequate volume   Culture   Final    NO GROWTH 5 DAYS Performed at [facility], [address] [place], [place] [address]    Report Status 11/23/2017 FINAL  Final  CULTURE, BLOOD (ROUTINE X 2) w Reflex to ID Panel     Status: None   Collection Time: 11/18/17  7:21 AM  Result Value Ref Range Status   Specimen  Description BLOOD LEFT WRIST  Final   Special Requests   Final    BOTTLES DRAWN AEROBIC AND ANAEROBIC Blood Culture adequate volume   Culture   Final    NO GROWTH 5 DAYS Performed at [facility], [address] [place], [place] [address]    Report Status 11/23/2017 FINAL  Final    RADIOLOGY:  No results found.   Management plans discussed with the patient, family and they are in agreement.  CODE STATUS:     Code Status Orders  (From admission, onward)         Start     Ordered   11/16/17 0019  Full code  Continuous     11/16/17 0018        Code Status History    Date Active Date Inactive Code Status Order ID Comments User Context   09/29/2016 2043 10/07/2016 1514 Full Code [id number]  Gonzella Lex, MD Inpatient   09/17/2016 1901 09/18/2016 1556 Full Code [id number]  Demetrios Loll, MD Inpatient      TOTAL TIME TAKING CARE OF THIS PATIENT: *40* minutes.    Fritzi Mandes M.D on 11/24/2017 at 4:39 PM  Between 7am to 6pm - Pager - [phone] After 6pm go to [email or web address] - password [email or web address] [facility]  Office  [phone]  CC: Primary care physician; Raelyn Number, MD

## 2017-11-24 NOTE — Consult Note (Signed)
[facility] Psychiatry Consult   Reason for Consult: Follow-up consult for this 57 year old man with schizoaffective disorder who is being readied for discharge Referring Physician: Anselm Jungling Patient Identification: Austin Gates MRN:  [id number] Principal Diagnosis: Schizoaffective disorder, bipolar type [facility]) Diagnosis:   Patient Active Problem List   Diagnosis Date Noted  . Protein-calorie malnutrition, severe [E43] 11/20/2017  . Aspiration pneumonia of both lungs ([place]) [J69.0]   . Hypotension [I95.9]   . Pancytopenia ([place]) [D61.818]   . ARF (acute renal failure) ([place]) [N17.9] 11/15/2017  . Elevated lithium level [R79.89] 11/06/2017  . Fall [W19.XXXA] 11/06/2017  . Schizoaffective disorder, bipolar type ([place]) [F25.0] 10/04/2016  . Tobacco use disorder [F17.200] 09/30/2016  . 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 male patient admitted with patient not able to give information.  HPI: Spoke with the group home worker and the patient's relative who were there in the room.  Spoke with nursing and social work.  Patient is being readied for discharge back to his group home.  In the course of this hospitalization he was taken off of all of his psychiatric medicine.  Family note that he is "talking out of his head" even more today than usual.  They are concerned about his potential for psychiatric decompensation and behavior problems outside the hospital.  I spoke with him for a while and validated that I think that is a very legitimate concern.  This patient has been on psychiatric medicines for over 40 years and there is a very high risk that he is going to decompensate badly without any medication.  On the other hand I understand that the treatment team here working on his delirium and unsteadiness felt it was better that he be off of all of his medicine.  Past Psychiatric  History: Long-standing problems with developmental disability cognitive impairment and schizoaffective disorder  Risk to Self:   Risk to Others:   Prior Inpatient Therapy:   Prior Outpatient Therapy:    Past Medical History:  Past Medical History:  Diagnosis Date  . Diabetes mellitus without complication ([place])   . Hypertension     Past Surgical History:  Procedure Laterality Date  . KNEE ARTHROSCOPY     Family History:  Family History  Problem Relation Age of Onset  . Diabetes Mother    Family Psychiatric  History: See previous note Social History:  Social History   Substance and Sexual Activity  Alcohol Use No     Social History   Substance and Sexual Activity  Drug Use No    Social History   Socioeconomic History  . Marital status: Single    Spouse name: Not on file  . Number of children: Not on file  . Years of education: Not on file  . Highest education level: Not on file  Occupational History  . Not on file  Social Needs  . Financial resource strain: Not on file  . Food insecurity:    Worry: Not on file    Inability: Not on file  . Transportation needs:    Medical: Not on file    Non-medical: Not on file  Tobacco Use  . Smoking status: Current Every Day Smoker    Packs/day: 1.00    Types: Cigarettes  . Smokeless tobacco: Never Used  Substance and Sexual Activity  . Alcohol use: No  . Drug use: No  .  Sexual activity: Never  Lifestyle  . Physical activity:    Days per week: Not on file    Minutes per session: Not on file  . Stress: Not on file  Relationships  . Social connections:    Talks on phone: Not on file    Gets together: Not on file    Attends religious service: Not on file    Active member of club or organization: Not on file    Attends meetings of clubs or organizations: Not on file    Relationship status: Not on file  Other Topics Concern  . Not on file  Social History Narrative  . Not on file   Additional Social History:     Allergies:  No Known Allergies  Labs:  Results for orders placed or performed during the hospital encounter of 11/15/17 (from the past 48 hour(s))  Glucose, capillary     Status: None   Collection Time: 11/22/17  6:09 PM  Result Value Ref Range   Glucose-Capillary 97 70 - 99 mg/dL   Comment 1 Notify RN   Glucose, capillary     Status: Abnormal   Collection Time: 11/23/17 12:27 AM  Result Value Ref Range   Glucose-Capillary 152 (H) 70 - 99 mg/dL  Comprehensive metabolic panel     Status: Abnormal   Collection Time: 11/23/17  4:50 AM  Result Value Ref Range   Sodium 144 135 - 145 mmol/L   Potassium 4.8 3.5 - 5.1 mmol/L   Chloride 107 98 - 111 mmol/L   CO2 32 22 - 32 mmol/L   Glucose, Bld 69 (L) 70 - 99 mg/dL   BUN 23 (H) 6 - 20 mg/dL   Creatinine, Ser 1.14 0.61 - 1.24 mg/dL   Calcium 9.3 8.9 - 10.3 mg/dL   Total Protein 6.0 (L) 6.5 - 8.1 g/dL   Albumin 2.4 (L) 3.5 - 5.0 g/dL   AST 45 (H) 15 - 41 U/L   ALT 41 0 - 44 U/L   Alkaline Phosphatase 62 38 - 126 U/L   Total Bilirubin 0.7 0.3 - 1.2 mg/dL   GFR calc non Af Amer >60 >60 mL/min   GFR calc Af Amer >60 >60 mL/min    Comment: (NOTE) The eGFR has been calculated using the CKD EPI equation. This calculation has not been validated in all clinical situations. eGFR's persistently <60 mL/min signify possible Chronic Kidney Disease.    Anion gap 5 5 - 15    Comment: Performed at [facility], [address] [place], [place] [address]  Magnesium     Status: None   Collection Time: 11/23/17  4:50 AM  Result Value Ref Range   Magnesium 1.8 1.7 - 2.4 mg/dL    Comment: Performed at [facility], [address] [place], [place] [address]  Phosphorus     Status: None   Collection Time: 11/23/17  4:50 AM  Result Value Ref Range   Phosphorus 3.9 2.5 - 4.6 mg/dL    Comment: Performed at [facility], [address] [place],  [address]  Glucose, capillary     Status: None   Collection Time:  11/23/17 11:40 AM  Result Value Ref Range   Glucose-Capillary 85 70 - 99 mg/dL   Comment 1 Notify RN   Glucose, capillary     Status: None   Collection Time: 11/23/17  5:16 PM  Result Value Ref Range   Glucose-Capillary 90 70 - 99 mg/dL   Comment 1  Notify RN   Glucose, capillary     Status: Abnormal   Collection Time: 11/23/17 11:45 PM  Result Value Ref Range   Glucose-Capillary 110 (H) 70 - 99 mg/dL  Glucose, capillary     Status: Abnormal   Collection Time: 11/24/17  6:42 AM  Result Value Ref Range   Glucose-Capillary 122 (H) 70 - 99 mg/dL  Glucose, capillary     Status: None   Collection Time: 11/24/17 11:50 AM  Result Value Ref Range   Glucose-Capillary 84 70 - 99 mg/dL   Comment 1 Notify RN     Current Facility-Administered Medications  Medication Dose Route Frequency Provider Last Rate Last Dose  . 0.9 %  sodium chloride infusion   Intravenous PRN Salary, Montell D, MD      . acetaminophen (TYLENOL) tablet 650 mg  650 mg Oral Q6H PRN Amelia Jo, MD       Or  . acetaminophen (TYLENOL) suppository 650 mg  650 mg Rectal Q6H PRN Amelia Jo, MD      . bisacodyl (DULCOLAX) EC tablet 5 mg  5 mg Oral Daily PRN Amelia Jo, MD   5 mg at 11/17/17 0954  . chlorhexidine (PERIDEX) 0.12 % solution 15 mL  15 mL Mouth Rinse BID Salary, Montell D, MD   15 mL at 11/24/17 0006  . cholecalciferol (VITAMIN D) tablet 5,000 Units  5,000 Units Oral Daily Amelia Jo, MD   5,000 Units at 11/20/17 1105  . docusate sodium (COLACE) capsule 100 mg  100 mg Oral BID Amelia Jo, MD   100 mg at 11/17/17 0751  . guaiFENesin (MUCINEX) 12 hr tablet 600 mg  600 mg Oral BID Salary, Montell D, MD   600 mg at 11/24/17 0007  . haloperidol lactate (HALDOL) injection 2 mg  2 mg Intramuscular Q8H PRN Salary, Holly Bodily D, MD   2 mg at 11/24/17 0125  . insulin aspart (novoLOG) injection 0-9 Units  0-9 Units Subcutaneous Q6H Salary, Montell D, MD   1 Units at 11/24/17 0654  . ipratropium-albuterol (DUONEB)  0.5-2.5 (3) MG/3ML nebulizer solution 3 mL  3 mL Nebulization Q4H PRN Salary, Montell D, MD   3 mL at 11/20/17 1614  . MEDLINE mouth rinse  15 mL Mouth Rinse q12n4p Salary, Montell D, MD      . midodrine (PROAMATINE) tablet 5 mg  5 mg Oral TID WC Dustin Flock, MD   5 mg at 11/21/17 0826  . nicotine (NICODERM CQ - dosed in mg/24 hours) patch 14 mg  14 mg Transdermal Daily Salary, Montell D, MD   14 mg at 11/23/17 1021  . ondansetron (ZOFRAN) tablet 4 mg  4 mg Oral Q6H PRN Amelia Jo, MD       Or  . ondansetron [facility]) injection 4 mg  4 mg Intravenous Q6H PRN Amelia Jo, MD      . phenol (CHLORASEPTIC) mouth spray 2 spray  2 spray Mouth/Throat PRN Epifanio Lesches, MD      . polyethylene glycol (MIRALAX / GLYCOLAX) packet 17 g  17 g Oral Daily Fritzi Mandes, MD      . scopolamine (TRANSDERM-SCOP) 1 MG/3DAYS 1.5 mg  1 patch Transdermal Q72H Salary, Montell D, MD   1.5 mg at 11/22/17 1403  . sodium chloride flush (NS) 0.9 % injection 10-40 mL  10-40 mL Intracatheter Q12H Alexis Goodell, MD   10 mL at 11/24/17 0008  . sodium chloride flush (NS) 0.9 % injection 10-40 mL  10-40 mL Intracatheter PRN  Alexis Goodell, MD        Musculoskeletal: Strength & Muscle Tone: decreased Gait & Station: unsteady Patient leans: N/A  Psychiatric Specialty Exam: Physical Exam  Nursing note and vitals reviewed. Constitutional: He appears well-developed and well-nourished.  HENT:  Head: Normocephalic and atraumatic.  Eyes: Pupils are equal, round, and reactive to light. Conjunctivae are normal.  Neck: Normal range of motion.  Cardiovascular: Normal heart sounds.  Respiratory: Effort normal. No respiratory distress.  GI: Soft.  Musculoskeletal: Normal range of motion.  Neurological: He is alert.  Skin: Skin is warm and dry.  Psychiatric: His affect is blunt. His speech is delayed. He is not agitated and not aggressive. Thought content is not paranoid. Cognition and memory are impaired. He  expresses inappropriate judgment. He expresses no homicidal and no suicidal ideation. He is noncommunicative.    Review of Systems  Unable to perform ROS: Psychiatric disorder    Blood pressure 101/69, pulse 71, temperature 98.2 F (36.8 C), temperature source Oral, resp. rate 16, height _0  (1.854 m), weight 75.3 kg, SpO2 100 %.Body mass index is 21.9 kg/m.  General Appearance: Disheveled  Eye Contact:  Fair  Speech:  Slurred  Volume:  Decreased  Mood:  Euthymic  Affect:  Constricted  Thought Process:  Disorganized  Orientation:  Negative  Thought Content:  NA  Suicidal Thoughts:  No  Homicidal Thoughts:  No  Memory:  Negative  Judgement:  Negative  Insight:  Negative  Psychomotor Activity:  Negative  Concentration:  Concentration: Negative  Recall:  Negative  Fund of Knowledge:  Negative  Language:  Negative  Akathisia:  Negative  Handed:  Right  AIMS (if indicated):     Assets:  Desire for Improvement Housing Resilience Social Support  ADL's:  Impaired  Cognition:  Impaired,  Severe  Sleep:        Treatment Plan Summary: Medication management and Plan I discussed some options for his psychiatric management with his family in the group home worker who is very familiar with him.  My suggestion was that we restart some of his clozapine and Depakote as those medicines have been long-standing clearly helpful medications for him, but that we do it had a significantly decreased dose from what he was at before.  They were agreeable to this.  I have made arrangements to restart clozapine at only a total of 100 mg at night and Depakote at a total of 1000 mg at night.  New scripts are done for those.  I have written out a note that they can use to verify the order at the group home.  They absolutely need to continue following up with his outpatient medical and psychiatric treatment.  Everyone appears to be in agreement with this plan at discharge.  Disposition: No evidence of  imminent risk to self or others at present.   Patient does not meet criteria for psychiatric inpatient admission. Supportive therapy provided about ongoing stressors.  Alethia Berthold, MD 11/24/2017 4:44 PM

## 2017-11-24 NOTE — Care Management Note (Signed)
Case Management Note  Patient Details  Name: Austin Gates MRN: [id number] Date of Birth: [birth year]   RW delivered to room prior to discharge.  [name] with [facility] notified of referral.  MD to order home health PT and speech.    Subjective/Objective:                    Action/Plan:   Expected Discharge Date:  11/24/17               Expected Discharge Plan:  [place]  In-House Referral:     Discharge planning Services  CM Consult  Post Acute Care Choice:  Durable Medical Equipment, Home Health Choice offered to:  [facility] / Guardian  DME Arranged:  [place] [facility] DME Agency:  [place] Arranged:  PT, Speech Therapy [place] Agency:  [place]  Status of Service:  Completed, signed off  If discussed at [place] of Stay Meetings, dates discussed:    Additional Comments:  Beverly Sessions, RN 11/24/2017, 4:53 PM

## 2017-11-24 NOTE — NC FL2 (Addendum)
  [place] LEVEL OF CARE SCREENING TOOL     IDENTIFICATION  Patient Name: Austin Gates Birthdate: [birth year] Sex: male Admission Date (Current Location): 11/15/2017  [place] and [place] Number:  Engineering geologist and Address:  [facility], [address] [place], [place] [address]      Provider Number: [id number]  Attending Physician Name and Address:  Fritzi Mandes, MD  Relative Name and Phone Number:       Current Level of Care: Hospital Recommended Level of Care: Other (Comment)(AFL) Prior Approval Number:    Date Approved/Denied:   PASRR Number:    Discharge Plan: (AFL)    Current Diagnoses: Patient Active Problem List   Diagnosis Date Noted  . Protein-calorie malnutrition, severe 11/20/2017  . Aspiration pneumonia of both lungs ([place])   . Hypotension   . Pancytopenia ([place])   . ARF (acute renal failure) ([place]) 11/15/2017  . Elevated lithium level 11/06/2017  . Fall 11/06/2017  . Schizoaffective disorder, bipolar type ([place]) 10/04/2016  . Tobacco use disorder 09/30/2016  . 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 09/30/2016    Orientation RESPIRATION BLADDER Height & Weight     Self, Place  Normal Incontinent Weight: 166 lb (75.3 kg) Height:  6\' 1"  (185.4 cm)  BEHAVIORAL SYMPTOMS/MOOD NEUROLOGICAL BOWEL NUTRITION STATUS  (none) (none) Incontinent Diet(dysphagia 1)  AMBULATORY STATUS COMMUNICATION OF NEEDS Skin   Supervision Verbally Normal                       Personal Care Assistance Level of Assistance  Bathing, Feeding, Dressing Bathing Assistance: Limited assistance Feeding assistance: Limited assistance Dressing Assistance: Limited assistance     Functional Limitations Info  (none)          SPECIAL CARE FACTORS FREQUENCY  PT (By licensed PT)   Palliative Care To Follow                    Contractures Contractures Info: Not present    Additional Factors Info  Code Status Code Status  Info: full             DISCHARGE MEDICATIONS:   Allergies as of 11/24/2017   No Known Allergies        Medication List    STOP taking these medications   desmopressin 0.2 MG tablet Commonly known as:  DDAVP   divalproex 500 MG 24 hr tablet Commonly known as:  DEPAKOTE ER   hydrALAZINE 25 MG tablet Commonly known as:  APRESOLINE   metoprolol tartrate 25 MG tablet Commonly known as:  LOPRESSOR   polyethylene glycol packet Commonly known as:  MIRALAX / GLYCOLAX     TAKE these medications   cloZAPine 100 MG tablet Commonly known as:  CLOZARIL Take 1 tablet (100 mg total) by mouth 2 (two) times daily. What changed:  Another medication with the same name was removed. Continue taking this medication, and follow the directions you see here.   docusate sodium 100 MG capsule Commonly known as:  COLACE Take 1 capsule (100 mg total) by mouth 2 (two) times daily.   ibuprofen 600 MG tablet Commonly known as:  ADVIL,MOTRIN Take 1 tablet (600 mg total) by mouth every 8 (eight) hours as needed.   Vitamin D-3 5000 units Tabs Take 1 tablet by mouth daily.      Clozapine 100 mg PO QHS Depakote 500 mg DR PO QHS  Shela Leff, LCSW

## 2017-11-24 NOTE — Progress Notes (Signed)
[facility] [place] NAME: Austin Gates    MR#:  [id number]  DATE OF BIRTH:  [birth year]  SUBJECTIVE:  patient at baseline remains somewhat confused although pleasantly. Mentation much improved after discontinuing psych meds. Awaiting interventional radiology to place peg tube today.  Pt wants egg sausage and biscuit ! :(  REVIEW OF SYSTEMS:   Review of Systems  Constitutional: Negative for chills, fever and weight loss.  HENT: Negative for ear discharge, ear pain and nosebleeds.   Eyes: Negative for blurred vision, pain and discharge.  Respiratory: Negative for sputum production, shortness of breath, wheezing and stridor.   Cardiovascular: Negative for chest pain, palpitations, orthopnea and PND.  Gastrointestinal: Negative for abdominal pain, diarrhea, nausea and vomiting.  Genitourinary: Negative for frequency and urgency.  Musculoskeletal: Negative for back pain and joint pain.  Neurological: Negative for sensory change, speech change, focal weakness and weakness.  Psychiatric/Behavioral: Negative for depression and hallucinations. The patient is not nervous/anxious.    Tolerating Diet:npo Tolerating PT:   DRUG ALLERGIES:  No Known Allergies  VITALS:  Blood pressure 114/67, pulse (!) 58, temperature 97.8 F (36.6 C), temperature source Oral, resp. rate 20, height 6\' 1"  (1.854 m), weight 75.3 kg, SpO2 95 %.  PHYSICAL EXAMINATION:   Physical Exam  GENERAL:  57 y.o.-year-old patient lying in the bed with no acute distress. Thin cachectic EYES: Pupils equal, round, reactive to light and accommodation. No scleral icterus. Extraocular muscles intact.  HEENT: Head atraumatic, normocephalic. Oropharynx and nasopharynx clear.  NECK:  Supple, no jugular venous distention. No thyroid enlargement, no tenderness.  LUNGS: Normal breath sounds bilaterally, no wheezing, rales, rhonchi. No use of accessory muscles of respiration.   CARDIOVASCULAR: S1, S2 normal. No murmurs, rubs, or gallops.  ABDOMEN: Soft, nontender, nondistended. Bowel sounds present. No organomegaly or mass.  EXTREMITIES: No cyanosis, clubbing or edema b/l.    NEUROLOGIC: Cranial nerves II through XII are intact. No focal Motor or sensory deficits b/l.   PSYCHIATRIC:  patient is alert and alert. Pleasantly confused SKIN: No obvious rash, lesion, or ulcer.   LABORATORY PANEL:  CBC Recent Labs  Lab 11/22/17 0430  WBC 3.6*  HGB 9.0*  HCT 25.7*  PLT 84*    Chemistries  Recent Labs  Lab 11/23/17 0450  NA 144  K 4.8  CL 107  CO2 32  GLUCOSE 69*  BUN 23*  CREATININE 1.14  CALCIUM 9.3  MG 1.8  AST 45*  ALT 41  ALKPHOS 62  BILITOT 0.7   Cardiac Enzymes No results for input(s): TROPONINI in the last 168 hours. RADIOLOGY:  Ct Abdomen Pelvis W Contrast  Result Date: 11/22/2017 CLINICAL DATA:  Malnutrition. Elevated CEA level. Evaluation for PEG placement. EXAM: CT ABDOMEN AND PELVIS WITH CONTRAST TECHNIQUE: Multidetector CT imaging of the abdomen and pelvis was performed using the standard protocol following bolus administration of intravenous contrast. CONTRAST:  141mL ISOVUE-300 IOPAMIDOL (ISOVUE-300) INJECTION 61% COMPARISON:  Ultrasound 11/19/2017 FINDINGS: Lower chest: Bilateral effusions layering dependently with dependent pulmonary atelectasis. Non dependent lung is clear. Heart size is normal. Hepatobiliary: No abnormality of the liver parenchyma is seen. Insignificant 5 mm cyst in the posterior right lobe of the liver. Collapsed gallbladder. No calcified gallstones. Pancreas: Normal Spleen: Normal Adrenals/Urinary Tract: Adrenal glands are normal. Kidneys are normal. Bladder is normal. Stomach/Bowel: Large amount of fecal matter throughout the colon. No primary small bowel pathology is seen. Vascular/Lymphatic: Aortic atherosclerosis. No aneurysm. IVC is normal. Reproductive:  Normal Other: Small to moderate amount of ascites, freely  distributed. Musculoskeletal: Ordinary lower lumbar degenerative changes. IMPRESSION: Pleural effusions layering dependently with dependent pulmonary atelectasis. Small to moderate amount of ascites freely distributed within the peritoneal space. Very large amount of stool throughout the colon. No obstructing lesion identified. No sign of inflammatory disease. No abdominal organ pathology evident. Electronically Signed   By: Nelson Chimes M.D.   On: 11/22/2017 15:02   ASSESSMENT AND PLAN:  Austin Gates  is a 57 y.o. male with a known history of tobacco abuse, psychiatric disorder and mental retardation.  Patient lives in an assisted living. He is not able to provide reliable history due to his mental status. Patient was sent to emergency room for increased drowsiness, confusion, generalized weakness and low blood pressure.  Most of his symptoms have been going on for the past 5 to 7 days, gradually getting worse.   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 to maintain body weight -Speech therapy input appreciated-failed pured diet with nectar thickened liquids--failed trial again yday - continue TPN, for IR placed PEG tube today-- with patient's sister [name]. She is coming around 82 and consent will be obtained from her. - status post platelet transfusion given thrombocytopenia prior to procedure November 21, 2017  *Acute on chronic moderate to severe protein calorie/energy malnutrition continue  TPN, follow-up on CEA elevated at 11.1, AFP- nl, CA 19-9 nl, PEG tube placement as stated above  *Pancytopenia Stable Oncology input appreciated Most likely related to psychotropic medications Abdominal ultrasound unimpressive, oncology/hematology input appreciated, anemia work-up consistent with iron deficiency-treated with IV iron, status post platelet transfusion given thrombocytopenia-4 planned Peg-tube placement -CT of the abdomen negative except constipation.   *Acute elevated CEA level -CT abdomen/pelvis-- for any mass in the intense time, gastroenterology with Dr. Alice Reichert appreciated.  -Patient currently finished treatment with aspiration pneumonia. - G.I. workup with luminal evaluation will be done as outpatient.  *Acute kidney injury Resolved with IV fluids for rehydration  *Acute bilateral aspiration pneumonia, present on admission  Resolving Secondary to encephalopathy and dysphasia Chest x-rays were always inconclusive, CT of the chest noted for bilateral pneumonia - patient completed Zosyn  -cont aspiration precautions  *Schizoaffective/bipolar disorder Psychiatry input appreciated  -resume psych meds per Dr Weber Cooks (dosing adjusted--review his note) once allowed to use PEG   *Tobacco smoking abuse/dependency  Nicotine patch and cessation counseling ordered  *discharge planning to rehab/snF after PEG  Placement  Case discussed with Care Management/Social Worker. Management plans discussed with the patient, family and they are in agreement.  CODE STATUS:full  DVT Prophylaxis: scd (has thrombocytopenia)  TOTAL TIME TAKING CARE OF THIS PATIENT: *30* minutes.  >50% time spent on counselling and coordination of care  POSSIBLE D/C IN **1-2 DAYS, DEPENDING ON CLINICAL CONDITION.  Note: This dictation was prepared with Dragon dictation along with smaller phrase technology. Any transcriptional errors that result from this process are unintentional.  Fritzi Mandes M.D on 11/24/2017 at 8:51 AM  Between 7am to 6pm - Pager - [phone]  After 6pm go to [email or web address] - [place]  [facility]  Office  [phone]  CC: Primary care physician; Raelyn Number, MDPatient ID: Austin Gates, male   DOB: 05/28/1960, 57 y.o.   MRN: [id number]

## 2017-11-24 NOTE — Discharge Instructions (Signed)
Pured diet with nectar thick liquid

## 2017-11-24 NOTE — Progress Notes (Signed)
Physical Therapy Treatment Patient Details Name: Austin Gates MRN: [id number] DOB: 08-Jul-1960 Today's Date: 11/24/2017    History of Present Illness Pt is a 57 y.o. male with a known history of tobacco abuse, psychiatric disorder and mental retardation.  Patient lives in an assisted living. He is not able to provide reliable history due to his mental status. Patient was sent to emergency room for increased drowsiness, confusion, generalized weakness and low blood pressure.  Most of his symptoms have been going on for the past 5 to 7 days, gradually getting worse.  No reports of fever, chills, nausea, vomiting, diarrhea, bleeding.  In the emergency room, patient was noted with systolic blood pressure in 70s.  This finally improved after 4 L of fluids to 80s.  Blood test done emergency room were notable for sodium level of 148, creatinine level 1.57 and BUN  44.  UA was negative for UTI and chest x-ray was negative for acute cardiopulmonary disease.  Abdominal x-ray shows large stool burden. Patient is admitted for further evaluation and treatment.  Assessment includes: Acute renal failure, likely prerenal, related to dehydration and possible side effects from medications, Acute encephalopathy, hypotension, and constipation.     PT Comments    Pt presents with deficits in strength, transfers, mobility, gait, balance, and activity tolerance but is progressing towards goals.  Pt remains very impulsive requiring frequent verbal and tactile cues for general sequencing with functional mobility tasks for safety.  Pt modified ind with bed mobility tasks and CGA with transfers with min posterior instability requiring min A to prevent LOB with pt standing without UE support but with a RW pt was able to self-correct without assistance.  Pt was able to amb 100 feet before fatiguing and requiring to sit.  Pt ambulated with a high degree of variation in regards to step length and cadence and remained impulsive with  frequent verbal cues for sequencing for general safety.  Pt's SpO2 and HR were WNL pre and post-ambulation. Pt will benefit from PT services in a SNF setting upon discharge to safely address above deficits for decreased caregiver assistance and eventual return to PLOF.     Follow Up Recommendations  SNF     Equipment Recommendations  Rolling walker with 5" wheels    Recommendations for Other Services       Precautions / Restrictions Precautions Precautions: Fall Restrictions Weight Bearing Restrictions: No    Mobility  Bed Mobility Overal bed mobility: Modified Independent   Rolling: Modified independent (Device/Increase time)   Supine to sit: Modified independent (Device/Increase time)     General bed mobility comments: pt able to perform sup to sit without assist, very impuslive and needs cues for safety.  Transfers Overall transfer level: Needs assistance Equipment used: Rolling walker (2 wheeled) Transfers: Sit to/from Stand Sit to Stand: Min guard         General transfer comment: Min instability in standing with pt able to self-correct   Ambulation/Gait Ambulation/Gait assistance: +2 safety/equipment;Min guard Gait Distance (Feet): 100 Feet Assistive device: Rolling walker (2 wheeled) Gait Pattern/deviations: Step-through pattern;Decreased step length - right;Decreased step length - left;Shuffle Gait velocity: varried   General Gait Details: Very irregular step pattern and speed with poor awarenss and general safety.  Max verbal cues for increased B step length and amb closer to the RW.  Requires +2 assist at all times for pt and staff safety   Stairs             Wheelchair  Mobility    Modified Rankin (Stroke Patients Only)       Balance Overall balance assessment: Needs assistance Sitting-balance support: Bilateral upper extremity supported;Feet supported Sitting balance-Leahy Scale: Good     Standing balance support: Bilateral upper  extremity supported;No upper extremity supported Standing balance-Leahy Scale: Poor Standing balance comment: Posterior instability in standing without UE support but fair standing balance with BUE support                            Cognition Arousal/Alertness: Awake/alert Behavior During Therapy: WFL for tasks assessed/performed Overall Cognitive Status: History of cognitive impairments - at baseline                                        Exercises Total Joint Exercises Ankle Circles/Pumps: AROM;Both;10 reps Long Arc Quad: AROM;Both;5 reps;10 reps Knee Flexion: AROM;Both;5 reps;10 reps Marching in Standing: AROM;Both;5 reps;10 reps;Seated Other Exercises Other Exercises: Pt found wet with large amount of urine, care provided and full bed change with nurse tech    General Comments        Pertinent Vitals/Pain Pain Assessment: No/denies pain    Home Living                      Prior Function            PT Goals (current goals can now be found in the care plan section) Progress towards PT goals: Progressing toward goals    Frequency    Min 2X/week      PT Plan Current plan remains appropriate    Co-evaluation              AM-PAC PT "6 Clicks" Daily Activity  Outcome Measure                   End of Session Equipment Utilized During Treatment: Gait belt Activity Tolerance: Patient tolerated treatment well;Patient limited by fatigue Patient left: in chair;with family/visitor present;with nursing/sitter in room;with call bell/phone within reach;with chair alarm set Nurse Communication: Mobility status PT Visit Diagnosis: Unsteadiness on feet (R26.81);Muscle weakness (generalized) (M62.81);Difficulty in walking, not elsewhere classified (R26.2);Repeated falls (R29.6)     Time: 0165-5374 PT Time Calculation (min) (ACUTE ONLY): 28 min  Charges:  $Gait Training: 8-22 mins $Therapeutic Activity: 8-22 mins                      D. Scott Shamar Kracke PT, DPT 11/24/17, 1:27 PM

## 2017-11-24 NOTE — Progress Notes (Signed)
Nutrition Brief Note  Discussed nutrition plan of care with RN and MD. Patient's family has decided they do not want to pursue G-tube placement. They would like patient to be able to eat for pleasure.  Order for G-tube placement has been discontinued. TPN is being tapered and will be discontinued today. Patient has been started on dysphagia 1 diet with nectar-thick liquids for pleasure. RD has ordered H[employer] Shakes (Vital Cuisine) to come on trays (each supplement provides 520 kcal and 22 grams of protein).  Willey Blade, MS, [place], LDN Office: [phone] Pager: [phone] After Hours/Weekend Pager: [phone]

## 2017-11-24 NOTE — Clinical Social Work Note (Signed)
Patient's sisters have decided against the PEG placement and patient will return with Shirlean Mylar in her group home this afternoon with outpatient palliative following. Shela Leff MSW,LcSW [phone]

## 2017-11-24 NOTE — Progress Notes (Signed)
Daily Progress Note   Patient Name: Austin Gates       Date: 11/24/2017 DOB: 1960/03/02  Age: 57 y.o. MRN#: [id number] Attending Physician: Fritzi Mandes, MD Primary Care Physician: Raelyn Number, MD Admit Date: 11/15/2017  Reason for Consultation/Follow-up: Establishing goals of care  Subjective: Patient sitting up in bed laughing and talking with family. He is more alert and awake today. Denies pain or discomfort. Asking if I brought him coffee, bacon or eggs. Also asking can he go out to eat when he gets home and get a double cheeseburger with fries. He excitedly reported to me that he walked with the therapist yesterday and that it felt good to be able to get out of bed.   His family is at the bedside, sister/POA, Austin Gates, Austin Gates (sister), Austin Gates and Austin Gates (bother caregivers from group home). Family and caregivers voiced their happiness that patient appears like the man that they are familiar with. Sister reports that she has decided to go against the PEG tube being placed. She reports "if something happens it just happens, but I can't deprive him from eating the things that he want.Especially with him doing so much better!"   I spent time re-educating sisters and caregivers on risk for aspiration and confirmation of upper esophageal stasis and slowing by SLP evaluation. Again, also discussed complications of aspiration, signs of aspiration, and also of silent aspiration. We discussed recommended diet for safety also. Family verbalized understanding. I attempted to also discuss code status given family has chosen against PEG and to continue to allow patient to eat.  [name] continues to want patient to remain a Full Code and verbalized she would address the situation when it happens.   I  educated and suggested patient be followed by outpatient Palliative care at minimum. [name] verbalized agreement and understanding.   Chart Reviewed.   Length of Stay: 9  Current Medications: Scheduled Meds:  . chlorhexidine  15 mL Mouth Rinse BID  . cholecalciferol  5,000 Units Oral Daily  . docusate sodium  100 mg Oral BID  . guaiFENesin  600 mg Oral BID  . insulin aspart  0-9 Units Subcutaneous Q6H  . mouth rinse  15 mL Mouth Rinse q12n4p  . midodrine  5 mg Oral TID WC  . nicotine  14 mg Transdermal Daily  .  polyethylene glycol  17 g Oral Daily  . scopolamine  1 patch Transdermal Q72H  . sodium chloride flush  10-40 mL Intracatheter Q12H    Continuous Infusions: . sodium chloride      PRN Meds: sodium chloride, acetaminophen **OR** acetaminophen, bisacodyl, haloperidol lactate, ipratropium-albuterol, ondansetron **OR** ondansetron (ZOFRAN) IV, phenol, sodium chloride flush  Physical Exam  Constitutional: Vital signs are normal. He is cooperative.  Cardiovascular: Normal rate, regular rhythm, normal heart sounds and normal pulses.  Pulmonary/Chest: Effort normal and breath sounds normal.  Neurological: He is alert.  Nursing note and vitals reviewed.           Vital Signs: BP 114/67 (BP Location: Left Arm)   Pulse (!) 58   Temp 97.8 F (36.6 C) (Oral)   Resp 20   Ht 6\' 1"  (1.854 m)   Wt 75.3 kg   SpO2 95%   BMI 21.90 kg/m  SpO2: SpO2: 95 % O2 Device: O2 Device: Room Air O2 Flow Rate: O2 Flow Rate (L/min): 2 L/min  Intake/output summary:   Intake/Output Summary (Last 24 hours) at 11/24/2017 1313 Last data filed at 11/24/2017 0451 Gross per 24 hour  Intake 1642.14 ml  Output -  Net 1642.14 ml   LBM: Last BM Date: 11/24/17 Baseline Weight: Weight: 65.8 kg Most recent weight: Weight: 75.3 kg      Palliative Assessment/Data: PPS 50%   Flowsheet Rows     Most Recent Value  Intake Tab  Referral Department  Hospitalist  Unit at Time of Referral  Med/Surg  Unit  Palliative Care Primary Diagnosis  Neurology  Date Notified  11/19/17  Palliative Care Type  New Palliative care  Reason for referral  Clarify Goals of Care  Date of Admission  11/15/17  Date first seen by Palliative Care  11/20/17  # of days Palliative referral response time  1 Day(s)  # of days IP prior to Palliative referral  4  Clinical Assessment  Psychosocial & Spiritual Assessment  Palliative Care Outcomes      Patient Active Problem List   Diagnosis Date Noted  . Protein-calorie malnutrition, severe 11/20/2017  . Aspiration pneumonia of both lungs ([place])   . Hypotension   . Pancytopenia ([place])   . ARF (acute renal failure) ([place]) 11/15/2017  . Elevated lithium level 11/06/2017  . Fall 11/06/2017  . Schizoaffective disorder, bipolar type ([place]) 10/04/2016  . Tobacco use disorder 09/30/2016  . 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 He presented to the ED with complaints of increased confusion, generalized weakness, and low blood pressure. He has a past medical history significant ofmental retardation, schizoaffective disorder, tobacco use, and bipolar disorder.Patient currently lives at the group home and was unable to provide history due to mental status. During his ED course it was reported that most of the patient's symptoms have been going on for 5 to 7 days prior to admission and gradually worsening. No reports of fever, chills, nausea, vomiting, diarrhea, or bleeding. Facility worker also reports patient has experienced some weight loss in the past months despite having a good appetite and also noted that patient was started on a new medication for nocturnal enuresis (desmopressin). They also reported that patient's lithium has been discontinued 2 weeks prior. Patient was given a total of 4 L of fluid due to  hypotension with systolic blood pressure in the 70s. Sodium 148. Creatinine  1.57. BUN 44. Platelet 96. UA negative for UTI. Chest x-ray is negative for acute cardiopulmonary disease. Abdominal x-ray showed large stool burden. Admission patient has been seen by neurology and psychiatry. MRI of the brain noted. Bilateral small hygromas with no needed intervention, EEG noted for slowing/no seizure activity, CT head noted for sinus disease, psychotropic meds were discontinued. Patient also seen by speech and dietitian. Patient was started on TPN, and after increasing alertness has now been placed on pured diet with nectar thickened liquids. Palliative medicine team consulted for goals of care discussion.  Recommendations/Plan:  Full Code-at sister/POA, [name]'s request  Continue to treat the treatable. Family has decided to not proceed with PEG placement, despite risk and continuous signs of aspiration. Family and caregivers educated extensively by myself, SLP, and providers in regards to risk and complications of aspiration.   Family requesting outpatient palliative for support at discharge.   Goals of Care and Additional Recommendations:  Limitations on Scope of Treatment: Full Scope Treatment  Code Status:    Code Status Orders  (From admission, onward)         Start     Ordered   11/16/17 0019  Full code  Continuous     11/16/17 0018        Code Status History    Date Active Date Inactive Code Status Order ID Comments User Context   09/29/2016 2043 10/07/2016 1514 Full Code [id number]  Gonzella Lex, MD Inpatient   09/17/2016 1901 09/18/2016 1556 Full Code [id number]  Demetrios Loll, MD Inpatient       Prognosis:   Unable to determine  Discharge Planning:  Back to [facility] with outpatient Palliative services and necessary home health care.   Care plan was discussed with patient, patient's family & caregivers, Dr. Posey Pronto, and [place], [employer].   Thank you  for allowing the Palliative Medicine Team to assist in the care of this patient.   Total Time 45 min.  Prolonged Time Billed NO       Greater than 50%  of this time was spent counseling and coordinating care related to the above assessment and plan.  Alda Lea, AGPCNP-BC Palliative Medicine Team  Pager: [phone] Amion: Bjorn Pippin   Please contact Palliative Medicine Team phone at [phone] for questions and concerns.

## 2017-12-12 ENCOUNTER — Telehealth: Payer: Self-pay

## 2017-12-12 NOTE — Telephone Encounter (Signed)
LVM for pts sister/Legal Guardian to call office to discuss referral.  To also ask permission to speak with [place] Blas regarding scheduling.  Thanks [employer]

## 2017-12-13 ENCOUNTER — Other Ambulatory Visit: Payer: Self-pay

## 2017-12-13 ENCOUNTER — Telehealth: Payer: Self-pay

## 2017-12-13 DIAGNOSIS — Z1211 Encounter for screening for malignant neoplasm of colon: Secondary | ICD-10-CM

## 2017-12-13 NOTE — Telephone Encounter (Signed)
Contacted patients legal guardian [name] to obtain permission to discuss and schedule colonoscopy with [place] [place] Blas.  [name] has given verbal permission to discuss and schedule colonoscopy with  Blas.  Thanks [employer]

## 2017-12-20 ENCOUNTER — Telehealth: Payer: Self-pay

## 2017-12-20 ENCOUNTER — Other Ambulatory Visit: Payer: Self-pay | Admitting: Psychiatry

## 2017-12-20 MED ORDER — NA SULFATE-K SULFATE-MG SULF 17.5-3.13-1.6 GM/177ML PO SOLN: 1.0000 | Freq: Once | ORAL | 0 refills | Status: AC

## 2017-12-20 NOTE — Telephone Encounter (Signed)
Patients caregiver contacting office to receive rx for bowel prep.  I asked Robin, Caregiver, did the patient do a clear liquid diet today.  She said she didn't think so because she was waiting for the rx.  I sent instructions to her via email and she states she did receive them but did not print out the instructions.  Colonoscopy has been rescheduled to 12/28/17.  Shirlean Mylar has been advised of Cancellation Fee.  Trish in Endoscopy has been informed of date change and referral updated.  Thanks [employer]

## 2017-12-20 NOTE — Telephone Encounter (Signed)
Austin Gates, patients legal guardian has been informed of [name] Colonoscopy rescheduling due to not having clear liquid diet and low fiber diet.  Colonoscopy was rescheduled to 12/28/17.  Informed her that a cancellation fee of $100 would be billed.  Thanks [employer]

## 2017-12-27 ENCOUNTER — Encounter: Payer: Self-pay | Admitting: *Deleted

## 2017-12-28 ENCOUNTER — Ambulatory Visit: Payer: Medicaid Other | Admitting: Anesthesiology

## 2017-12-28 ENCOUNTER — Encounter: Payer: Self-pay | Admitting: *Deleted

## 2017-12-28 ENCOUNTER — Encounter
Admission: RE | Disposition: A | Discharge status: Home / Self Care | Payer: Self-pay | Source: Ambulatory Visit | Attending: Gastroenterology

## 2017-12-28 ENCOUNTER — Ambulatory Visit
Admission: RE | Admit: 2017-12-28 | Discharge: 2017-12-28 | Disposition: A | Discharge status: Home / Self Care | Payer: Medicaid Other | Source: Ambulatory Visit | Attending: Gastroenterology | Admitting: Gastroenterology

## 2017-12-28 DIAGNOSIS — Z79899 Other long term (current) drug therapy: Secondary | ICD-10-CM | POA: Insufficient documentation

## 2017-12-28 DIAGNOSIS — Z87891 Personal history of nicotine dependence: Secondary | ICD-10-CM | POA: Diagnosis not present

## 2017-12-28 DIAGNOSIS — Z1211 Encounter for screening for malignant neoplasm of colon: Secondary | ICD-10-CM | POA: Diagnosis not present

## 2017-12-28 DIAGNOSIS — I10 Essential (primary) hypertension: Secondary | ICD-10-CM | POA: Insufficient documentation

## 2017-12-28 HISTORY — PX: COLONOSCOPY WITH PROPOFOL: SHX5780

## 2017-12-28 SURGERY — COLONOSCOPY WITH PROPOFOL
Anesthesia: General

## 2017-12-28 MED ORDER — SODIUM CHLORIDE 0.9 % IV SOLN
INTRAVENOUS | Status: DC
  Administered 2017-12-28: 1000 mL via INTRAVENOUS

## 2017-12-28 MED ORDER — PROPOFOL 10 MG/ML IV BOLUS
INTRAVENOUS | Status: DC | PRN
  Administered 2017-12-28: 70 mg via INTRAVENOUS

## 2017-12-28 MED ORDER — PROPOFOL 500 MG/50ML IV EMUL
INTRAVENOUS | Status: AC
  Filled 2017-12-28: qty 50

## 2017-12-28 MED ORDER — PHENYLEPHRINE HCL 10 MG/ML IJ SOLN
INTRAMUSCULAR | Status: DC | PRN
  Administered 2017-12-28 (×2): 100 ug via INTRAVENOUS

## 2017-12-28 MED ORDER — PROPOFOL 500 MG/50ML IV EMUL
INTRAVENOUS | Status: DC | PRN
  Administered 2017-12-28: 100 ug/kg/min via INTRAVENOUS

## 2017-12-28 NOTE — H&P (Signed)
Jonathon Bellows, MD [address] [place], [place], [place], [address] [address], [place], [place], [place], [address] Phone: [phone]  Fax: [phone]  Primary Care Physician:  Raelyn Number, MD   Pre-Procedure History & Physical: HPI:  Austin Gates is a 57 y.o. male is here for an colonoscopy.   Past Medical History:  Diagnosis Date  . Hypertension     Past Surgical History:  Procedure Laterality Date  . KNEE ARTHROSCOPY      Prior to Admission medications   Medication Sig Start Date End Date Taking? Authorizing Provider  cloZAPine (CLOZARIL) 100 MG tablet Take 1 tablet (100 mg total) by mouth 2 (two) times daily. 10/06/16  Yes Hildred Priest, MD  docusate sodium (COLACE) 100 MG capsule Take 1 capsule (100 mg total) by mouth 2 (two) times daily. 10/04/16  Yes Hildred Priest, MD  Cholecalciferol (VITAMIN D-3) 5000 units TABS Take 1 tablet by mouth daily.    [provider]  clozapine (CLOZARIL) 100 MG tablet Take 1 tablet (100 mg total) by mouth at bedtime. 11/24/17   [name], [name], MD  divalproex (DEPAKOTE) 500 MG DR tablet Take 2 tablets (1,000 mg total) by mouth at bedtime. 11/24/17 12/24/17  [name], [name], MD  ibuprofen (ADVIL,MOTRIN) 600 MG tablet Take 1 tablet (600 mg total) by mouth every 8 (eight) hours as needed. 10/13/17   Earleen Newport, MD    Allergies as of 12/13/2017  . (No Known Allergies)    Family History  Problem Relation Age of Onset  . Diabetes Mother     Social History   Socioeconomic History  . Marital status: Single    Spouse name: Not on file  . Number of children: Not on file  . Years of education: Not on file  . Highest education level: Not on file  Occupational History  . Not on file  Social Needs  . Financial resource strain: Not on file  . Food insecurity:    Worry: Not on file    Inability: Not on file  . Transportation needs:    Medical: Not on file   Non-medical: Not on file  Tobacco Use  . Smoking status: Former Smoker    Packs/day: 1.00    Types: Cigarettes  . Smokeless tobacco: Never Used  Substance and Sexual Activity  . Alcohol use: No  . Drug use: No  . Sexual activity: Never  Lifestyle  . Physical activity:    Days per week: Not on file    Minutes per session: Not on file  . Stress: Not on file  Relationships  . Social connections:    Talks on phone: Not on file    Gets together: Not on file    Attends religious service: Not on file    Active member of club or organization: Not on file    Attends meetings of clubs or organizations: Not on file    Relationship status: Not on file  . Intimate partner violence:    Fear of current or ex partner: Not on file    Emotionally abused: Not on file    Physically abused: Not on file    Forced sexual activity: Not on file  Other Topics Concern  . Not on file  Social History Narrative  . Not on file    Review of Systems: See HPI, otherwise negative ROS  Physical Exam: There were no vitals taken for this visit.  General:   Alert,  pleasant and cooperative in NAD Head:  Normocephalic and atraumatic. Neck:  Supple; no masses or thyromegaly. Lungs:  Clear throughout to auscultation, normal respiratory effort.    Heart:  +S1, +S2, Regular rate and rhythm, No edema. Abdomen:  Soft, nontender and nondistended. Normal bowel sounds, without guarding, and without rebound.   Neurologic:  Alert and  oriented x4;  grossly normal neurologically.  Impression/Plan: Austin Gates is here for an colonoscopy to be performed for Screening colonoscopy average risk   Risks, benefits, limitations, and alternatives regarding  colonoscopy have been reviewed with the patient.  Questions have been answered.  All parties agreeable.   Jonathon Bellows, MD  12/28/2017, 8:25 AM

## 2017-12-28 NOTE — Anesthesia Preprocedure Evaluation (Addendum)
Anesthesia Evaluation  Patient identified by MRN, date of birth, ID band Patient awake    Reviewed: Allergy & Precautions, H&P , NPO status , Patient's Chart, lab work & pertinent test results  Airway Mallampati: III  TM Distance: >3 FB     Dental  (+) Edentulous Upper, Edentulous Lower   Pulmonary pneumonia (h/o aspiration PNA, discharged from hosp >4 wks ago, no residual symptoms), resolved, former smoker,           Cardiovascular hypertension,      Neuro/Psych PSYCHIATRIC DISORDERS Bipolar Disorder Schizophrenia Developmentally delayed with IQ 51 negative neurological ROS     GI/Hepatic negative GI ROS, Neg liver ROS,   Endo/Other  negative endocrine ROS  Renal/GU negative Renal ROS  negative genitourinary   Musculoskeletal   Abdominal   Peds  Hematology negative hematology ROS (+)   Anesthesia Other Findings Past Medical History: No date: Hypertension  Past Surgical History: No date: KNEE ARTHROSCOPY     Reproductive/Obstetrics negative OB ROS                           Anesthesia Physical Anesthesia Plan  ASA: II  Anesthesia Plan: General   Post-op Pain Management:    Induction:   PONV Risk Score and Plan: Propofol infusion and TIVA  Airway Management Planned:   Additional Equipment:   Intra-op Plan:   Post-operative Plan:   Informed Consent: I have reviewed the patients History and Physical, chart, labs and discussed the procedure including the risks, benefits and alternatives for the proposed anesthesia with the patient or authorized representative who has indicated his/her understanding and acceptance.   Dental Advisory Given  Plan Discussed with: Anesthesiologist, CRNA and Surgeon  Anesthesia Plan Comments: (Plan discussed with patient, legal guardian and caregiver who consented to anesthesia.)       Anesthesia Quick Evaluation

## 2017-12-28 NOTE — Anesthesia Post-op Follow-up Note (Signed)
Anesthesia QCDR form completed.        

## 2017-12-28 NOTE — Transfer of Care (Signed)
Immediate Anesthesia Transfer of Care Note  Patient: Austin Gates  Procedure(s) Performed: COLONOSCOPY WITH PROPOFOL (N/A )  Patient Location: PACU  Anesthesia Type:General  Level of Consciousness: awake and responds to stimulation  Airway & Oxygen Therapy: Patient Spontanous Breathing and Patient connected to nasal cannula oxygen  Post-op Assessment: Report given to RN and Post -op Vital signs reviewed and stable  Post vital signs: Reviewed and stable  Last Vitals:  Vitals Value Taken Time  BP 91/74 12/28/2017  9:09 AM  Temp 36.1 C 12/28/2017  9:06 AM  Pulse 69 12/28/2017  9:09 AM  Resp 11 12/28/2017  9:09 AM  SpO2 100 % 12/28/2017  9:09 AM    Last Pain:  Vitals:   12/28/17 0906  TempSrc: Tympanic  PainSc: 0-No pain         Complications: No apparent anesthesia complications

## 2017-12-28 NOTE — Op Note (Signed)
[facility] Gastroenterology Patient Name: Austin Gates Procedure Date: 12/28/2017 8:38 AM MRN: [id number] Account #: [id number] Date of Birth: 08/24/1960 Admit Type: Outpatient Age: 57 Room: [facility] ENDO ROOM 4 Gender: Male Note Status: Finalized Procedure:            Colonoscopy Indications:          Screening for colorectal malignant neoplasm Providers:            Jonathon Bellows MD, MD Referring MD:         Rosalyn Charters. Jannette Fogo (Referring MD) Medicines:            Monitored Anesthesia Care Complications:        No immediate complications. Procedure:            Pre-Anesthesia Assessment:                       - Prior to the procedure, a History and Physical was                        performed, and patient medications, allergies and                        sensitivities were reviewed. The patient's tolerance of                        previous anesthesia was reviewed.                       - The risks and benefits of the procedure and the                        sedation options and risks were discussed with the                        patient. All questions were answered and informed                        consent was obtained.                       - ASA Grade Assessment: III - A patient with severe                        systemic disease.                       After obtaining informed consent, the colonoscope was                        passed under direct vision. Throughout the procedure,                        the patient's blood pressure, pulse, and oxygen                        saturations were monitored continuously. The                        Colonoscope was introduced through the anus with the  intention of advancing to the cecum. The scope was                        advanced to the sigmoid colon before the procedure was                        aborted. Medications were given. The colonoscopy was                        performed without  difficulty. The patient tolerated the                        procedure well. The quality of the bowel preparation                        was poor. Findings:      A large amount of semi-solid stool was found in the rectum and in the       sigmoid colon, interfering with visualization. Impression:           - Preparation of the colon was poor.                       - Stool in the rectum and in the sigmoid colon.                       - No specimens collected. Recommendation:       - Discharge patient to home (with escort).                       - Resume previous diet.                       - Continue present medications.                       - Repeat colonoscopy in 2 weeks for screening purposes. Procedure Code(s):    --- Professional ---                       [phone], 58, Colonoscopy, flexible; diagnostic, including                        collection of specimen(s) by brushing or washing, when                        performed (separate procedure) Diagnosis Code(s):    --- Professional ---                       Z12.11, Encounter for screening for malignant neoplasm                        of colon CPT copyright 2018 American Medical Association. All rights reserved. The codes documented in this report are preliminary and upon coder review may  be revised to meet current compliance requirements. Jonathon Bellows, MD Jonathon Bellows MD, MD 12/28/2017 8:58:25 AM This report has been signed electronically. Number of Addenda: 0 Note Initiated On: 12/28/2017 8:38 AM Total Procedure Duration: 0 hours 1 minute 49 seconds       [facility]

## 2017-12-28 NOTE — Anesthesia Postprocedure Evaluation (Signed)
Anesthesia Post Note  Patient: Austin Gates  Procedure(s) Performed: COLONOSCOPY WITH PROPOFOL (N/A )  Patient location during evaluation: PACU Anesthesia Type: General Level of consciousness: awake and alert Pain management: pain level controlled Vital Signs Assessment: post-procedure vital signs reviewed and stable Respiratory status: spontaneous breathing, nonlabored ventilation and respiratory function stable Cardiovascular status: blood pressure returned to baseline and stable Postop Assessment: no apparent nausea or vomiting Anesthetic complications: no     Last Vitals:  Vitals:   12/28/17 0909 12/28/17 0916  BP: 91/74 117/81  Pulse: 69   Resp: 11   Temp:    SpO2: 100%     Last Pain:  Vitals:   12/28/17 0936  TempSrc:   PainSc: 0-No pain                 Durenda Hurt

## 2018-01-01 ENCOUNTER — Encounter: Payer: Self-pay | Admitting: Gastroenterology

## 2018-02-15 ENCOUNTER — Ambulatory Visit (INDEPENDENT_AMBULATORY_CARE_PROVIDER_SITE_OTHER): Payer: Medicaid Other | Admitting: Gastroenterology

## 2018-02-15 ENCOUNTER — Other Ambulatory Visit: Payer: Self-pay

## 2018-02-15 ENCOUNTER — Encounter: Payer: Self-pay | Admitting: Gastroenterology

## 2018-02-15 VITALS — BP 119/77 | HR 84 | Ht 73.0 in | Wt 142.4 lb

## 2018-02-15 DIAGNOSIS — Z1211 Encounter for screening for malignant neoplasm of colon: Secondary | ICD-10-CM | POA: Diagnosis not present

## 2018-02-15 MED ORDER — PEG 3350-KCL-NA BICARB-NACL 420 G PO SOLR: 4000.0000 mL | Freq: Once | ORAL | 0 refills | Status: AC

## 2018-02-15 NOTE — Progress Notes (Signed)
   Jonathon Bellows MD, MRCP(U.K) [address]  [place]  [place], [place] [address]  Main: [phone]  Fax: [phone]   Primary Care Physician: Raelyn Number, MD  Primary Gastroenterologist:  Dr. Jonathon Bellows   No chief complaint on file.   HPI: Austin Gates is a 57 y.o. male    He is here today to discus his screening colonoscopy . He has had issues scheduling, executing the prep and had the procedure on 12/28/17 which was poorly prepped.   Here to discuss further with his care taker and sister.   Current Outpatient Medications  Medication Sig Dispense Refill  . Cholecalciferol (VITAMIN D-3) 5000 units TABS Take 1 tablet by mouth daily.    . cloZAPine (CLOZARIL) 100 MG tablet Take 1 tablet (100 mg total) by mouth 2 (two) times daily. 60 tablet 0  . clozapine (CLOZARIL) 100 MG tablet Take 1 tablet (100 mg total) by mouth at bedtime. 30 tablet 0  . divalproex (DEPAKOTE) 500 MG DR tablet Take 2 tablets (1,000 mg total) by mouth at bedtime. 60 tablet 0  . docusate sodium (COLACE) 100 MG capsule Take 1 capsule (100 mg total) by mouth 2 (two) times daily. 60 capsule 0  . ibuprofen (ADVIL,MOTRIN) 600 MG tablet Take 1 tablet (600 mg total) by mouth every 8 (eight) hours as needed. 30 tablet 0   No current facility-administered medications for this visit.     Allergies as of 02/15/2018  . (No Known Allergies)    ROS:  General: Negative for anorexia, weight loss, fever, chills, fatigue, weakness. ENT: Negative for hoarseness, difficulty swallowing , nasal congestion. CV: Negative for chest pain, angina, palpitations, dyspnea on exertion, peripheral edema.  Respiratory: Negative for dyspnea at rest, dyspnea on exertion, cough, sputum, wheezing.  GI: See history of present illness. GU:  Negative for dysuria, hematuria, urinary incontinence, urinary frequency, nocturnal urination.  Endo: Negative for unusual weight change.    Physical Examination:   There were no vitals  taken for this visit.  General: apears well  Eyes: No icterus. Conjunctivae pink. Mouth: Oropharyngeal mucosa moist and pink , no lesions erythema or exudate. Lungs: Clear to auscultation bilaterally. Non-labored. Heart: Regular rate and rhythm, no murmurs rubs or gallops.  Abdomen: Bowel sounds are normal, nontender, nondistended, no hepatosplenomegaly or masses, no abdominal bruits or hernia , no rebound or guarding.   Extremities: No lower extremity edema. No clubbing or deformities. Neuro: Alert and oriented x 2.  Grossly intact. Skin: Warm and dry, no jaundice.   Psych: Alert and cooperative,appears restless   Imaging Studies: No results found.  Assessment and Plan:   Austin Gates is a 57 y.o. y/o male here to discuss about his screening colonoscopy avergatre risk . Explained the whole process including bowel prep.   I have discussed alternative options, risks & benefits,  which include, but are not limited to, bleeding, infection, perforation,respiratory complication & drug reaction.  The patient agrees with this plan & written consent will be obtained.     Dr Jonathon Bellows  MD,MRCP [facility]) Follow up PRN

## 2018-02-16 ENCOUNTER — Emergency Department (HOSPITAL_COMMUNITY)
Admission: EM | Admit: 2018-02-16 | Discharge: 2018-02-17 | Disposition: A | Discharge status: Home / Self Care | Payer: Medicaid Other | Attending: Emergency Medicine | Admitting: Emergency Medicine

## 2018-02-16 DIAGNOSIS — F29 Unspecified psychosis not due to a substance or known physiological condition: Secondary | ICD-10-CM | POA: Diagnosis present

## 2018-02-16 DIAGNOSIS — I10 Essential (primary) hypertension: Secondary | ICD-10-CM | POA: Diagnosis not present

## 2018-02-16 DIAGNOSIS — F250 Schizoaffective disorder, bipolar type: Secondary | ICD-10-CM

## 2018-02-16 DIAGNOSIS — Z79899 Other long term (current) drug therapy: Secondary | ICD-10-CM | POA: Insufficient documentation

## 2018-02-16 DIAGNOSIS — F259 Schizoaffective disorder, unspecified: Secondary | ICD-10-CM | POA: Diagnosis not present

## 2018-02-16 DIAGNOSIS — Z87891 Personal history of nicotine dependence: Secondary | ICD-10-CM | POA: Diagnosis not present

## 2018-02-16 LAB — RAPID URINE DRUG SCREEN, HOSP PERFORMED
AMPHETAMINES: NOT DETECTED
BARBITURATES: NOT DETECTED
Benzodiazepines: NOT DETECTED
Cocaine: NOT DETECTED
Opiates: NOT DETECTED
TETRAHYDROCANNABINOL: NOT DETECTED

## 2018-02-16 LAB — ETHANOL: Alcohol, Ethyl (B): <10 mg/dL (ref <10)

## 2018-02-16 LAB — CBC WITH DIFFERENTIAL/PLATELET
Abs Immature Granulocytes: 0.02 10*3/uL (ref 0.00–0.07)
BASOS ABS: 0 10*3/uL (ref 0.0–0.1)
Basophils Relative: 0 %
Eosinophils Absolute: 0 10*3/uL (ref 0.0–0.5)
Eosinophils Relative: 0 %
HEMATOCRIT: 38.5 % — AB (ref 39.0–52.0)
HEMOGLOBIN: 11.8 g/dL — AB (ref 13.0–17.0)
Immature Granulocytes: 0 %
Lymphocytes Relative: 14 %
Lymphs Abs: 0.6 10*3/uL — ABNORMAL LOW (ref 0.7–4.0)
MCH: 30.5 pg (ref 26.0–34.0)
MCHC: 30.6 g/dL (ref 30.0–36.0)
MCV: 99.5 fL (ref 80.0–100.0)
Monocytes Absolute: 0.2 10*3/uL (ref 0.1–1.0)
Monocytes Relative: 4 %
NRBC: 0 % (ref 0.0–0.2)
Neutro Abs: 3.6 10*3/uL (ref 1.7–7.7)
Neutrophils Relative %: 82 %
Platelets: 165 10*3/uL (ref 150–400)
RBC: 3.87 MIL/uL — AB (ref 4.22–5.81)
RDW: 14.2 % (ref 11.5–15.5)
WBC: 4.5 10*3/uL (ref 4.0–10.5)

## 2018-02-16 LAB — COMPREHENSIVE METABOLIC PANEL
ALBUMIN: 3.6 g/dL (ref 3.5–5.0)
ALT: 29 U/L (ref 0–44)
AST: 44 U/L — AB (ref 15–41)
Alkaline Phosphatase: 67 U/L (ref 38–126)
Anion gap: 9 (ref 5–15)
BUN: 25 mg/dL — AB (ref 6–20)
CHLORIDE: 102 mmol/L (ref 98–111)
CO2: 27 mmol/L (ref 22–32)
Calcium: 9.5 mg/dL (ref 8.9–10.3)
Creatinine, Ser: 1.09 mg/dL (ref 0.61–1.24)
GFR calc Af Amer: >60 mL/min (ref >60)
GFR calc non Af Amer: >60 mL/min (ref >60)
GLUCOSE: 216 mg/dL — AB (ref 70–99)
Potassium: 4.3 mmol/L (ref 3.5–5.1)
Sodium: 138 mmol/L (ref 135–145)
Total Bilirubin: 0.5 mg/dL (ref 0.3–1.2)
Total Protein: 7.5 g/dL (ref 6.5–8.1)

## 2018-02-16 LAB — VALPROIC ACID LEVEL: Valproic Acid Lvl: 50 ug/mL (ref 50.0–100.0)

## 2018-02-16 MED ORDER — NICOTINE 21 MG/24HR TD PT24
21.0000 mg | MEDICATED_PATCH | Freq: Every day | TRANSDERMAL | Status: DC
  Filled 2018-02-16: qty 1

## 2018-02-16 MED ORDER — ACETAMINOPHEN 325 MG PO TABS
650.0000 mg | ORAL_TABLET | ORAL | Status: DC | PRN
  Administered 2018-02-16: 650 mg via ORAL
  Filled 2018-02-16: qty 2

## 2018-02-16 MED ORDER — DIVALPROEX SODIUM 500 MG PO DR TAB
1000.0000 mg | DELAYED_RELEASE_TABLET | Freq: Every day | ORAL | Status: DC
  Administered 2018-02-16: 1000 mg via ORAL
  Filled 2018-02-16: qty 2

## 2018-02-16 MED ORDER — ALUM & MAG HYDROXIDE-SIMETH 200-200-20 MG/5ML PO SUSP: 30.0000 mL | Freq: Four times a day (QID) | ORAL | Status: DC | PRN

## 2018-02-16 MED ORDER — TRAZODONE HCL 50 MG PO TABS: 150.0000 mg | ORAL_TABLET | Freq: Every evening | ORAL | Status: DC | PRN

## 2018-02-16 MED ORDER — CLOZAPINE 100 MG PO TABS
100.0000 mg | ORAL_TABLET | Freq: Every day | ORAL | Status: DC
  Administered 2018-02-16: 100 mg via ORAL
  Filled 2018-02-16: qty 1

## 2018-02-16 MED ORDER — TRAZODONE HCL 50 MG PO TABS
150.0000 mg | ORAL_TABLET | Freq: Every evening | ORAL | Status: DC | PRN
  Administered 2018-02-16: 150 mg via ORAL
  Filled 2018-02-16: qty 3

## 2018-02-16 MED ORDER — ONDANSETRON HCL 4 MG PO TABS: 4.0000 mg | ORAL_TABLET | Freq: Three times a day (TID) | ORAL | Status: DC | PRN
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
## 2018-02-16 NOTE — ED Notes (Addendum)
Patient IQ is 39.  Patient entered other patient's room.  Patient needs constant redirection.  Patient has history of aspiration pneumonia and was witnessed by staff stuffing food into his mouth.  Patient acuity too high for acute area.  Pt will transfer back to TCU.  Accepting RN notified.

## 2018-02-16 NOTE — ED Notes (Signed)
Pt is restless pacing around the room, speaks very loudly at times, other pt's have compliant about his voice volume. I will continue to redirect at this time.
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
## 2018-02-16 NOTE — ED Notes (Signed)
Per pt. Coordinator, pt.s hasn't smoked or used nicotine patch since October.
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
## 2018-02-17 DIAGNOSIS — F250 Schizoaffective disorder, bipolar type: Secondary | ICD-10-CM

## 2018-02-17 MED ORDER — CLOZAPINE 50 MG PO TABS: 50.0000 mg | ORAL_TABLET | Freq: Every day | ORAL | 0 refills | Status: DC

## 2018-02-17 MED ORDER — CLOZAPINE 25 MG PO TABS
50.0000 mg | ORAL_TABLET | Freq: Every day | ORAL | Status: DC
  Administered 2018-02-17: 50 mg via ORAL
  Filled 2018-02-17: qty 2

## 2018-02-17 NOTE — ED Notes (Signed)
Patient group home here from pickup.

## 2018-02-17 NOTE — Consult Note (Signed)
[facility] Psych ED Discharge  02/17/2018 9:27 AM Jessica Seidman  MRN:  [id number] Principal Problem: Schizoaffective disorder, bipolar type [facility]) Discharge Diagnoses: Principal Problem:   Schizoaffective disorder, bipolar type [facility])  Subjective: 57 yo male who presented to the ED under IVC for aggression.  His medications were adjusted and he remained calm and cooperative.  No suicidal/homicidal ideations, hallucinations, or substance abuse. His aggression increases with conflict and decreases with medications.  No aggression or other psychiatric issues at this time.  Stable to return to his group home, guardian notified.  Total Time spent with patient: 45 minutes  Past Psychiatric History: schizoaffective disorder, bipolar type  Past Medical History:  Past Medical History:  Diagnosis Date  . Hypertension     Past Surgical History:  Procedure Laterality Date  . COLONOSCOPY WITH PROPOFOL N/A 12/28/2017   Procedure: COLONOSCOPY WITH PROPOFOL;  Surgeon: Jonathon Bellows, MD;  Location: [facility] ENDOSCOPY;  Service: Gastroenterology;  Laterality: N/A;  . KNEE ARTHROSCOPY     Family History:  Family History  Problem Relation Age of Onset  . Diabetes Mother    Family Psychiatric  History: none Social History:  Social History   Substance and Sexual Activity  Alcohol Use No     Social History   Substance and Sexual Activity  Drug Use No    Social History   Socioeconomic History  . Marital status: Single    Spouse name: Not on file  . Number of children: Not on file  . Years of education: Not on file  . Highest education level: Not on file  Occupational History  . Not on file  Social Needs  . Financial resource strain: Not on file  . Food insecurity:    Worry: Not on file    Inability: Not on file  . Transportation needs:    Medical: Not on file    Non-medical: Not on file  Tobacco Use  . Smoking status: Former Smoker    Packs/day: 1.00    Types: Cigarettes  . Smokeless  tobacco: Never Used  Substance and Sexual Activity  . Alcohol use: No  . Drug use: No  . Sexual activity: Never  Lifestyle  . Physical activity:    Days per week: Not on file    Minutes per session: Not on file  . Stress: Not on file  Relationships  . Social connections:    Talks on phone: Not on file    Gets together: Not on file    Attends religious service: Not on file    Active member of club or organization: Not on file    Attends meetings of clubs or organizations: Not on file    Relationship status: Not on file  Other Topics Concern  . Not on file  Social History Narrative  . Not on file    Has this patient used any form of tobacco in the last 30 days? (Cigarettes, Smokeless Tobacco, Cigars, and/or Pipes) NA  Current Medications: Current Facility-Administered Medications  Medication Dose Route Frequency Provider Last Rate Last Dose  . acetaminophen (TYLENOL) tablet 650 mg  650 mg Oral Q4H PRN Jola Schmidt, MD   650 mg at 02/16/18 2230  . alum & mag hydroxide-simeth (MAALOX/MYLANTA) 200-200-20 MG/5ML suspension 30 mL  30 mL Oral Q6H PRN Jola Schmidt, MD      . cloZAPine (CLOZARIL) tablet 100 mg  100 mg Oral Dolly Rias, MD   100 mg at 02/16/18 2230  . cloZAPine (CLOZARIL) tablet 50 mg  50 mg Oral Daily Patrecia Pour, NP      . divalproex (DEPAKOTE) DR tablet 1,000 mg  1,000 mg Oral Dolly Rias, MD   1,000 mg at 02/16/18 2230  . nicotine (NICODERM CQ - dosed in mg/24 hours) patch 21 mg  21 mg Transdermal Daily Jola Schmidt, MD      . ondansetron [facility]) tablet 4 mg  4 mg Oral Q8H PRN Jola Schmidt, MD      . traZODone (DESYREL) tablet 150 mg  150 mg Oral QHS PRN Jola Schmidt, MD   150 mg at 02/16/18 2230   Current Outpatient Medications  Medication Sig Dispense Refill  . Cholecalciferol (VITAMIN D-3) 5000 units TABS Take 5,000 Units by mouth daily.     . clozapine (CLOZARIL) 100 MG tablet Take 1 tablet (100 mg total) by mouth at bedtime. 30 tablet 0   . divalproex (DEPAKOTE) 500 MG DR tablet Take 2 tablets (1,000 mg total) by mouth at bedtime. 60 tablet 0  . zolpidem (AMBIEN) 10 MG tablet Take 10 mg by mouth at bedtime.    . cloZAPine (CLOZARIL) 100 MG tablet Take 1 tablet (100 mg total) by mouth 2 (two) times daily. (Patient not taking: Reported on 02/16/2018) 60 tablet 0  . docusate sodium (COLACE) 100 MG capsule Take 1 capsule (100 mg total) by mouth 2 (two) times daily. (Patient not taking: Reported on 02/15/2018) 60 capsule 0  . ibuprofen (ADVIL,MOTRIN) 600 MG tablet Take 1 tablet (600 mg total) by mouth every 8 (eight) hours as needed. (Patient not taking: Reported on 02/15/2018) 30 tablet 0   PTA Medications: (Not in a hospital admission)   Musculoskeletal: Strength & Muscle Tone: within normal limits Gait & Station: normal Patient leans: N/A  Psychiatric Specialty Exam: Physical Exam  Nursing note and vitals reviewed. Constitutional: He is oriented to person, place, and time. He appears well-developed and well-nourished.  HENT:  Head: Normocephalic.  Respiratory: Effort normal.  Musculoskeletal: Normal range of motion.  Neurological: He is alert and oriented to person, place, and time.  Psychiatric: His speech is normal and behavior is normal. Thought content normal. His mood appears anxious. His affect is blunt and labile. Cognition and memory are impaired. He expresses impulsivity.    Review of Systems  Psychiatric/Behavioral: The patient is nervous/anxious.   All other systems reviewed and are negative.   Blood pressure 113/83, pulse 79, temperature (!) 97.5 F (36.4 C), temperature source Oral, resp. rate 18, height 6\' 1"  (1.854 m), weight 64.4 kg, SpO2 100 %.Body mass index is 18.73 kg/m.  General Appearance: Casual  Eye Contact:  Good  Speech:  Normal Rate  Volume:  Normal  Mood:  Anxious  Affect:  Blunt  Thought Process:  Coherent and Descriptions of Associations: Intact  Orientation:  Other:  person and  place  Thought Content:  WDL  Suicidal Thoughts:  No  Homicidal Thoughts:  No  Memory:  Immediate;   Fair Recent;   Poor Remote;   Poor  Judgement:  Impaired  Insight:  Lacking  Psychomotor Activity:  Normal  Concentration:  Concentration: Fair and Attention Span: Fair  Recall:  Poor  Fund of Knowledge:  Poor  Language:  Fair  Akathisia:  no   Handed:  Right  AIMS (if indicated):     Assets:  Housing Leisure Time Physical Health Resilience Social Support  ADL's:  Intact  Cognition:  Impaired,  Moderate  Sleep:        Demographic Factors:  Male  Loss Factors: NA  Historical Factors: Impulsivity  Risk Reduction Factors:   Sense of responsibility to family, Living with another person, especially a relative, Positive social support and Positive therapeutic relationship  Continued Clinical Symptoms:  Anxiety, mild  Cognitive Features That Contribute To Risk:  None    Suicide Risk:  Minimal: No identifiable suicidal ideation.  Patients presenting with no risk factors but with morbid ruminations; may be classified as minimal risk based on the severity of the depressive symptoms   Plan Of Care/Follow-up recommendations:  Schizoaffective disorder, bipolar type: -Increased Clozaril from 100 mg daily to 50 mg in the am and 100 mg in the pm -Continued Depakote 1000 mg daily  Insomnia: -Continued Trazodone 100 mg at bedtime  Activity:  as tolerated Diet:  heart healthy diuet  Disposition: discharge to group home Madaleine Simmon, Theodoro Clock, NP 02/17/2018, 9:27 AM

## 2018-02-17 NOTE — ED Notes (Signed)
Unable to sign discharge

## 2018-02-17 NOTE — Progress Notes (Signed)
CSW contacted patients ALF, Robin with [facility], at [phone] regarding disposition plans. CSW informed Shirlean Mylar patient has been psychiatrically cleared at this time and is ready to be discharged. Shirlean Mylar voiced understanding and will be at hospital around 12PM to pick patient up.   CSW also notified patients legal guardian, [name] [phone], and informed her of his clearance/ discharge. [name] voiced understanding and thanked CSW for the call.   Kingsley Spittle, LCSW Clinical Social Worker  [facility]  [phone]

## 2018-02-19 ENCOUNTER — Other Ambulatory Visit: Payer: Self-pay | Admitting: Family

## 2018-02-19 ENCOUNTER — Ambulatory Visit
Admission: RE | Admit: 2018-02-19 | Discharge: 2018-02-19 | Disposition: A | Discharge status: Home / Self Care | Payer: Medicaid Other | Source: Ambulatory Visit | Attending: Family | Admitting: Family

## 2018-02-19 DIAGNOSIS — R4701 Aphasia: Secondary | ICD-10-CM

## 2018-02-22 ENCOUNTER — Inpatient Hospital Stay (HOSPITAL_COMMUNITY): Payer: Medicaid Other

## 2018-02-22 ENCOUNTER — Emergency Department (HOSPITAL_COMMUNITY): Payer: Medicaid Other

## 2018-02-22 ENCOUNTER — Inpatient Hospital Stay (HOSPITAL_COMMUNITY)
Admission: EM | Admit: 2018-02-22 | Discharge: 2018-03-13 | DRG: 907 | Disposition: A | Discharge status: Home Health | Payer: Medicaid Other | Attending: Urology | Admitting: Urology

## 2018-02-22 ENCOUNTER — Encounter (HOSPITAL_COMMUNITY): Admission: EM | Disposition: A | Discharge status: Home Health | Payer: Self-pay | Source: Home / Self Care

## 2018-02-22 ENCOUNTER — Inpatient Hospital Stay (HOSPITAL_COMMUNITY): Payer: Medicaid Other | Admitting: Anesthesiology

## 2018-02-22 DIAGNOSIS — R402252 Coma scale, best verbal response, oriented, at arrival to emergency department: Secondary | ICD-10-CM | POA: Diagnosis present

## 2018-02-22 DIAGNOSIS — S01312A Laceration without foreign body of left ear, initial encounter: Secondary | ICD-10-CM | POA: Diagnosis present

## 2018-02-22 DIAGNOSIS — S3022XA Contusion of scrotum and testes, initial encounter: Secondary | ICD-10-CM | POA: Diagnosis present

## 2018-02-22 DIAGNOSIS — J9690 Respiratory failure, unspecified, unspecified whether with hypoxia or hypercapnia: Secondary | ICD-10-CM

## 2018-02-22 DIAGNOSIS — S1183XA Puncture wound without foreign body of other specified part of neck, initial encounter: Secondary | ICD-10-CM | POA: Diagnosis present

## 2018-02-22 DIAGNOSIS — N433 Hydrocele, unspecified: Secondary | ICD-10-CM | POA: Diagnosis present

## 2018-02-22 DIAGNOSIS — F250 Schizoaffective disorder, bipolar type: Secondary | ICD-10-CM | POA: Diagnosis present

## 2018-02-22 DIAGNOSIS — Z01818 Encounter for other preprocedural examination: Secondary | ICD-10-CM

## 2018-02-22 DIAGNOSIS — W3400XA Accidental discharge from unspecified firearms or gun, initial encounter: Secondary | ICD-10-CM

## 2018-02-22 DIAGNOSIS — R1310 Dysphagia, unspecified: Secondary | ICD-10-CM | POA: Diagnosis present

## 2018-02-22 DIAGNOSIS — R000 Tachycardia, unspecified: Secondary | ICD-10-CM | POA: Diagnosis not present

## 2018-02-22 DIAGNOSIS — R402142 Coma scale, eyes open, spontaneous, at arrival to emergency department: Secondary | ICD-10-CM | POA: Diagnosis present

## 2018-02-22 DIAGNOSIS — J9601 Acute respiratory failure with hypoxia: Secondary | ICD-10-CM | POA: Diagnosis present

## 2018-02-22 DIAGNOSIS — D62 Acute posthemorrhagic anemia: Secondary | ICD-10-CM | POA: Diagnosis present

## 2018-02-22 DIAGNOSIS — R402362 Coma scale, best motor response, obeys commands, at arrival to emergency department: Secondary | ICD-10-CM | POA: Diagnosis present

## 2018-02-22 DIAGNOSIS — Z23 Encounter for immunization: Secondary | ICD-10-CM | POA: Diagnosis not present

## 2018-02-22 DIAGNOSIS — G4700 Insomnia, unspecified: Secondary | ICD-10-CM | POA: Diagnosis not present

## 2018-02-22 DIAGNOSIS — S75092A Other specified injury of femoral artery, left leg, initial encounter: Secondary | ICD-10-CM | POA: Diagnosis present

## 2018-02-22 DIAGNOSIS — Z4659 Encounter for fitting and adjustment of other gastrointestinal appliance and device: Secondary | ICD-10-CM

## 2018-02-22 HISTORY — PX: INSERTION OF ILIAC STENT: SHX6256

## 2018-02-22 HISTORY — PX: SCROTAL EXPLORATION: SHX2386

## 2018-02-22 HISTORY — PX: HYDROCELE EXCISION: SHX482

## 2018-02-22 HISTORY — PX: UPPER EXTREMITY ANGIOGRAM: SHX6310

## 2018-02-22 LAB — POCT I-STAT 7, (LYTES, BLD GAS, ICA,H+H)
Acid-Base Excess: 3 mmol/L — ABNORMAL HIGH (ref 0.0–2.0)
Bicarbonate: 28.2 mmol/L — ABNORMAL HIGH (ref 20.0–28.0)
Calcium, Ion: 1.23 mmol/L (ref 1.15–1.40)
HCT: 23 % — ABNORMAL LOW (ref 39.0–52.0)
Hemoglobin: 7.8 g/dL — ABNORMAL LOW (ref 13.0–17.0)
O2 Saturation: 100 %
Potassium: 3.9 mmol/L (ref 3.5–5.1)
SODIUM: 145 mmol/L (ref 135–145)
TCO2: 30 mmol/L (ref 22–32)
pCO2 arterial: 47 mmHg (ref 32.0–48.0)
pH, Arterial: 7.386 (ref 7.350–7.450)
pO2, Arterial: 534 mmHg — ABNORMAL HIGH (ref 83.0–108.0)

## 2018-02-22 LAB — BLOOD GAS, ARTERIAL
Acid-base deficit: 0.5 mmol/L (ref 0.0–2.0)
Bicarbonate: 22.9 mmol/L (ref 20.0–28.0)
Drawn by: 535471
FIO2: 40
MECHVT: 640 mL
O2 Saturation: 99.6 %
PEEP: 5 cmH2O
Patient temperature: 98.6
RATE: 16 resp/min
pCO2 arterial: 32.8 mmHg (ref 32.0–48.0)
pH, Arterial: 7.458 — ABNORMAL HIGH (ref 7.350–7.450)
pO2, Arterial: 185 mmHg — ABNORMAL HIGH (ref 83.0–108.0)

## 2018-02-22 LAB — I-STAT CHEM 8, ED
BUN: 37 mg/dL — ABNORMAL HIGH (ref 6–20)
Calcium, Ion: 1.18 mmol/L (ref 1.15–1.40)
Chloride: 107 mmol/L (ref 98–111)
Creatinine, Ser: 1.2 mg/dL (ref 0.61–1.24)
Glucose, Bld: 285 mg/dL — ABNORMAL HIGH (ref 70–99)
HCT: 37 % — ABNORMAL LOW (ref 39.0–52.0)
HEMOGLOBIN: 12.6 g/dL — AB (ref 13.0–17.0)
Potassium: 4.3 mmol/L (ref 3.5–5.1)
SODIUM: 141 mmol/L (ref 135–145)
TCO2: 26 mmol/L (ref 22–32)

## 2018-02-22 LAB — BPAM FFP
BLOOD PRODUCT EXPIRATION DATE: 202001052359
Blood Product Expiration Date: 202001052359
ISSUE DATE / TIME: 202001020705
ISSUE DATE / TIME: 202001020705
Unit Type and Rh: 6200
Unit Type and Rh: 6200

## 2018-02-22 LAB — CBC
HCT: 30 % — ABNORMAL LOW (ref 39.0–52.0)
HCT: 29.5 % — ABNORMAL LOW (ref 39.0–52.0)
HEMATOCRIT: 38 % — AB (ref 39.0–52.0)
HEMOGLOBIN: 11.5 g/dL — AB (ref 13.0–17.0)
Hemoglobin: 9.6 g/dL — ABNORMAL LOW (ref 13.0–17.0)
Hemoglobin: 9.6 g/dL — ABNORMAL LOW (ref 13.0–17.0)
MCH: 30.1 pg (ref 26.0–34.0)
MCH: 29.2 pg (ref 26.0–34.0)
MCH: 29.7 pg (ref 26.0–34.0)
MCHC: 30.3 g/dL (ref 30.0–36.0)
MCHC: 32 g/dL (ref 30.0–36.0)
MCHC: 32.5 g/dL (ref 30.0–36.0)
MCV: 99.5 fL (ref 80.0–100.0)
MCV: 91.2 fL (ref 80.0–100.0)
MCV: 91.3 fL (ref 80.0–100.0)
NRBC: 0 % (ref 0.0–0.2)
Platelets: 208 10*3/uL (ref 150–400)
Platelets: 135 10*3/uL — ABNORMAL LOW (ref 150–400)
Platelets: 137 10*3/uL — ABNORMAL LOW (ref 150–400)
RBC: 3.82 MIL/uL — ABNORMAL LOW (ref 4.22–5.81)
RBC: 3.29 MIL/uL — ABNORMAL LOW (ref 4.22–5.81)
RBC: 3.23 MIL/uL — ABNORMAL LOW (ref 4.22–5.81)
RDW: 13.9 % (ref 11.5–15.5)
RDW: 14.8 % (ref 11.5–15.5)
RDW: 16 % — ABNORMAL HIGH (ref 11.5–15.5)
WBC: 8 10*3/uL (ref 4.0–10.5)
WBC: 6.8 10*3/uL (ref 4.0–10.5)
WBC: 5.4 10*3/uL (ref 4.0–10.5)
nRBC: 0 % (ref 0.0–0.2)
nRBC: 0 % (ref 0.0–0.2)

## 2018-02-22 LAB — COMPREHENSIVE METABOLIC PANEL
ALT: 29 U/L (ref 0–44)
AST: 43 U/L — ABNORMAL HIGH (ref 15–41)
Albumin: 3.3 g/dL — ABNORMAL LOW (ref 3.5–5.0)
Alkaline Phosphatase: 74 U/L (ref 38–126)
Anion gap: 13 (ref 5–15)
BUN: 33 mg/dL — ABNORMAL HIGH (ref 6–20)
CO2: 26 mmol/L (ref 22–32)
Calcium: 9.4 mg/dL (ref 8.9–10.3)
Chloride: 104 mmol/L (ref 98–111)
Creatinine, Ser: 1.47 mg/dL — ABNORMAL HIGH (ref 0.61–1.24)
GFR calc Af Amer: >60 mL/min (ref >60)
GFR calc non Af Amer: 53 mL/min — ABNORMAL LOW (ref >60)
Glucose, Bld: 298 mg/dL — ABNORMAL HIGH (ref 70–99)
POTASSIUM: 4.3 mmol/L (ref 3.5–5.1)
Sodium: 143 mmol/L (ref 135–145)
Total Bilirubin: 0.2 mg/dL — ABNORMAL LOW (ref 0.3–1.2)
Total Protein: 6.8 g/dL (ref 6.5–8.1)

## 2018-02-22 LAB — TRIGLYCERIDES: Triglycerides: 64 mg/dL (ref <150)

## 2018-02-22 LAB — GLUCOSE, CAPILLARY: GLUCOSE-CAPILLARY: 79 mg/dL (ref 70–99)

## 2018-02-22 LAB — I-STAT CG4 LACTIC ACID, ED: Lactic Acid, Venous: 6.07 mmol/L (ref 0.5–1.9)

## 2018-02-22 LAB — ABO/RH: ABO/RH(D): B POS

## 2018-02-22 LAB — I-STAT ARTERIAL BLOOD GAS, ED
ACID-BASE EXCESS: 2 mmol/L (ref 0.0–2.0)
Bicarbonate: 26.6 mmol/L (ref 20.0–28.0)
O2 Saturation: 100 %
Patient temperature: 93.7
TCO2: 28 mmol/L (ref 22–32)
pCO2 arterial: 37 mmHg (ref 32.0–48.0)
pH, Arterial: 7.453 — ABNORMAL HIGH (ref 7.350–7.450)
pO2, Arterial: 479 mmHg — ABNORMAL HIGH (ref 83.0–108.0)

## 2018-02-22 LAB — PREPARE RBC (CROSSMATCH)

## 2018-02-22 LAB — PREPARE FRESH FROZEN PLASMA
Unit division: 0
Unit division: 0

## 2018-02-22 LAB — URINALYSIS, ROUTINE W REFLEX MICROSCOPIC
Bilirubin Urine: NEGATIVE
Glucose, UA: NEGATIVE mg/dL
Hgb urine dipstick: NEGATIVE
Ketones, ur: NEGATIVE mg/dL
Leukocytes, UA: NEGATIVE
NITRITE: NEGATIVE
Protein, ur: NEGATIVE mg/dL
SPECIFIC GRAVITY, URINE: 1.03 (ref 1.005–1.030)
pH: 6 (ref 5.0–8.0)

## 2018-02-22 LAB — ETHANOL: Alcohol, Ethyl (B): <10 mg/dL (ref <10)

## 2018-02-22 LAB — PROTIME-INR
INR: 1.05
Prothrombin Time: 13.7 seconds (ref 11.4–15.2)

## 2018-02-22 LAB — CDS SEROLOGY

## 2018-02-22 LAB — MRSA PCR SCREENING: MRSA by PCR: NEGATIVE

## 2018-02-22 SURGERY — INSERTION, STENT, ARTERY, ILIAC
Anesthesia: General | Site: Scrotum

## 2018-02-22 MED ORDER — HYDRALAZINE HCL 20 MG/ML IJ SOLN: 10.0000 mg | INTRAMUSCULAR | Status: DC | PRN

## 2018-02-22 MED ORDER — CEFAZOLIN SODIUM 1 G IJ SOLR: 1.0000 g | Freq: Three times a day (TID) | INTRAMUSCULAR | Status: DC

## 2018-02-22 MED ORDER — LACTATED RINGERS IV SOLN
INTRAVENOUS | Status: DC | PRN
  Administered 2018-02-22 (×2): via INTRAVENOUS

## 2018-02-22 MED ORDER — PHENYLEPHRINE HCL 10 MG/ML IJ SOLN
INTRAMUSCULAR | Status: DC | PRN
  Administered 2018-02-22: 80 ug via INTRAVENOUS
  Administered 2018-02-22: 120 ug via INTRAVENOUS

## 2018-02-22 MED ORDER — SUCCINYLCHOLINE CHLORIDE 20 MG/ML IJ SOLN
INTRAMUSCULAR | Status: AC | PRN
  Administered 2018-02-22: 100 mg via INTRAVENOUS

## 2018-02-22 MED ORDER — HEPARIN SODIUM (PORCINE) 1000 UNIT/ML IJ SOLN
INTRAMUSCULAR | Status: DC | PRN
  Administered 2018-02-22: 5000 [IU] via INTRAVENOUS

## 2018-02-22 MED ORDER — PROPOFOL 1000 MG/100ML IV EMUL
INTRAVENOUS | Status: AC
  Filled 2018-02-22: qty 100

## 2018-02-22 MED ORDER — CEFAZOLIN SODIUM-DEXTROSE 1-4 GM/50ML-% IV SOLN
1.0000 g | Freq: Three times a day (TID) | INTRAVENOUS | Status: DC
  Administered 2018-02-22: 1 g via INTRAVENOUS
  Administered 2018-02-22: 2 g via INTRAVENOUS
  Administered 2018-02-23 – 2018-02-25 (×9): 1 g via INTRAVENOUS
  Filled 2018-02-22 (×11): qty 50

## 2018-02-22 MED ORDER — SODIUM CHLORIDE 0.9 % IV SOLN
INTRAVENOUS | Status: AC | PRN
  Administered 2018-02-22: 1000 mL via INTRAVENOUS

## 2018-02-22 MED ORDER — PROPOFOL 1000 MG/100ML IV EMUL: 0.0000 ug/kg/min | INTRAVENOUS | Status: DC

## 2018-02-22 MED ORDER — IOPAMIDOL (ISOVUE-370) INJECTION 76%
150.0000 mL | Freq: Once | INTRAVENOUS | Status: AC | PRN
  Administered 2018-02-22: 150 mL via INTRAVENOUS

## 2018-02-22 MED ORDER — CEFAZOLIN SODIUM-DEXTROSE 2-4 GM/100ML-% IV SOLN
INTRAVENOUS | Status: AC
  Filled 2018-02-22: qty 100

## 2018-02-22 MED ORDER — STERILE WATER FOR INJECTION IJ SOLN
INTRAMUSCULAR | Status: AC
  Filled 2018-02-22: qty 10

## 2018-02-22 MED ORDER — MIDAZOLAM HCL 2 MG/2ML IJ SOLN
INTRAMUSCULAR | Status: AC
  Administered 2018-02-22: 2 mg
  Filled 2018-02-22: qty 2

## 2018-02-22 MED ORDER — ONDANSETRON HCL 4 MG/2ML IJ SOLN: 4.0000 mg | Freq: Four times a day (QID) | INTRAMUSCULAR | Status: DC | PRN

## 2018-02-22 MED ORDER — DOCUSATE SODIUM 50 MG/5ML PO LIQD
100.0000 mg | Freq: Two times a day (BID) | ORAL | Status: DC | PRN
  Filled 2018-02-22: qty 10

## 2018-02-22 MED ORDER — PANTOPRAZOLE SODIUM 40 MG IV SOLR
40.0000 mg | Freq: Every day | INTRAVENOUS | Status: DC
  Administered 2018-02-23 – 2018-02-25 (×3): 40 mg via INTRAVENOUS
  Filled 2018-02-22 (×3): qty 40

## 2018-02-22 MED ORDER — LORAZEPAM 2 MG/ML IJ SOLN
1.0000 mg | Freq: Once | INTRAMUSCULAR | Status: DC
  Filled 2018-02-22: qty 0.5

## 2018-02-22 MED ORDER — MIDAZOLAM HCL 2 MG/2ML IJ SOLN
INTRAMUSCULAR | Status: AC
  Filled 2018-02-22: qty 4

## 2018-02-22 MED ORDER — ETOMIDATE 2 MG/ML IV SOLN
INTRAVENOUS | Status: AC | PRN
  Administered 2018-02-22: 20 mg via INTRAVENOUS

## 2018-02-22 MED ORDER — PANTOPRAZOLE SODIUM 40 MG PO TBEC
40.0000 mg | DELAYED_RELEASE_TABLET | Freq: Every day | ORAL | Status: DC
  Administered 2018-02-26 – 2018-03-12 (×15): 40 mg via ORAL
  Filled 2018-02-22 (×15): qty 1

## 2018-02-22 MED ORDER — POTASSIUM CHLORIDE IN NACL 20-0.9 MEQ/L-% IV SOLN
INTRAVENOUS | Status: DC
  Administered 2018-02-22 – 2018-02-25 (×6): via INTRAVENOUS
  Filled 2018-02-22 (×8): qty 1000

## 2018-02-22 MED ORDER — FENTANYL CITRATE (PF) 100 MCG/2ML IJ SOLN: 50.0000 ug | Freq: Once | INTRAMUSCULAR | Status: DC

## 2018-02-22 MED ORDER — VASOPRESSIN 20 UNIT/ML IV SOLN
INTRAVENOUS | Status: AC
  Filled 2018-02-22: qty 1

## 2018-02-22 MED ORDER — PROPOFOL 1000 MG/100ML IV EMUL
0.0000 ug/kg/min | INTRAVENOUS | Status: DC
  Administered 2018-02-22: 5 ug/kg/min via INTRAVENOUS
  Administered 2018-02-22: 10 ug/kg/min via INTRAVENOUS
  Administered 2018-02-23: 20 ug/kg/min via INTRAVENOUS
  Filled 2018-02-22 (×2): qty 100

## 2018-02-22 MED ORDER — MIDAZOLAM HCL 5 MG/5ML IJ SOLN
INTRAMUSCULAR | Status: AC | PRN
  Administered 2018-02-22: 2 mg via INTRAVENOUS

## 2018-02-22 MED ORDER — ONDANSETRON 4 MG PO TBDP: 4.0000 mg | ORAL_TABLET | Freq: Four times a day (QID) | ORAL | Status: DC | PRN

## 2018-02-22 MED ORDER — PROTAMINE SULFATE 10 MG/ML IV SOLN
INTRAVENOUS | Status: DC | PRN
  Administered 2018-02-22: 50 mg via INTRAVENOUS

## 2018-02-22 MED ORDER — MIDAZOLAM HCL 2 MG/2ML IJ SOLN
INTRAMUSCULAR | Status: AC
  Filled 2018-02-22: qty 2

## 2018-02-22 MED ORDER — MIDAZOLAM HCL 5 MG/5ML IJ SOLN
INTRAMUSCULAR | Status: AC | PRN
  Administered 2018-02-22: 4 mg via INTRAVENOUS

## 2018-02-22 MED ORDER — LORAZEPAM 2 MG/ML IJ SOLN
INTRAMUSCULAR | Status: AC
  Filled 2018-02-22: qty 1

## 2018-02-22 MED ORDER — SODIUM CHLORIDE 0.9 % IV SOLN
INTRAVENOUS | Status: DC | PRN
  Administered 2018-02-22: 500 mL

## 2018-02-22 MED ORDER — ROCURONIUM BROMIDE 100 MG/10ML IV SOLN
INTRAVENOUS | Status: DC | PRN
  Administered 2018-02-22: 30 mg via INTRAVENOUS
  Administered 2018-02-22: 40 mg via INTRAVENOUS
  Administered 2018-02-22: 10 mg via INTRAVENOUS
  Administered 2018-02-22: 20 mg via INTRAVENOUS
  Administered 2018-02-22: 10 mg via INTRAVENOUS
  Administered 2018-02-22 (×2): 20 mg via INTRAVENOUS

## 2018-02-22 MED ORDER — ROCURONIUM BROMIDE 50 MG/5ML IV SOSY
PREFILLED_SYRINGE | INTRAVENOUS | Status: AC
  Filled 2018-02-22: qty 10

## 2018-02-22 MED ORDER — TETANUS-DIPHTH-ACELL PERTUSSIS 5-2.5-18.5 LF-MCG/0.5 IM SUSP
INTRAMUSCULAR | Status: AC
  Administered 2018-02-22: 0.5 mL via INTRAMUSCULAR
  Filled 2018-02-22: qty 0.5

## 2018-02-22 MED ORDER — SODIUM CHLORIDE 0.9 % IV SOLN
INTRAVENOUS | Status: DC | PRN
  Administered 2018-02-22: 50 ug/min via INTRAVENOUS
  Administered 2018-02-22: 13:00:00 via INTRAVENOUS

## 2018-02-22 MED ORDER — ORAL CARE MOUTH RINSE
15.0000 mL | OROMUCOSAL | Status: DC
  Administered 2018-02-22 – 2018-02-23 (×10): 15 mL via OROMUCOSAL

## 2018-02-22 MED ORDER — VECURONIUM BROMIDE 10 MG IV SOLR
INTRAVENOUS | Status: AC | PRN
  Administered 2018-02-22: 10 mg via INTRAVENOUS

## 2018-02-22 MED ORDER — CHLORHEXIDINE GLUCONATE 0.12% ORAL RINSE (MEDLINE KIT)
15.0000 mL | Freq: Two times a day (BID) | OROMUCOSAL | Status: DC
  Administered 2018-02-22 – 2018-03-12 (×28): 15 mL via OROMUCOSAL

## 2018-02-22 MED ORDER — FENTANYL BOLUS VIA INFUSION
50.0000 ug | INTRAVENOUS | Status: DC | PRN
  Filled 2018-02-22: qty 50

## 2018-02-22 MED ORDER — FENTANYL BOLUS VIA INFUSION: 50.0000 ug | INTRAVENOUS | Status: DC | PRN

## 2018-02-22 MED ORDER — SODIUM CHLORIDE 0.9 % IV SOLN
10.0000 mL/h | Freq: Once | INTRAVENOUS | Status: AC
  Administered 2018-02-22: 10:00:00 via INTRAVENOUS

## 2018-02-22 MED ORDER — ALBUMIN HUMAN 5 % IV SOLN
INTRAVENOUS | Status: DC | PRN
  Administered 2018-02-22: 12:00:00 via INTRAVENOUS

## 2018-02-22 MED ORDER — FENTANYL CITRATE (PF) 250 MCG/5ML IJ SOLN
INTRAMUSCULAR | Status: AC
  Filled 2018-02-22: qty 5

## 2018-02-22 MED ORDER — BACITRACIN ZINC 500 UNIT/GM EX OINT
TOPICAL_OINTMENT | Freq: Two times a day (BID) | CUTANEOUS | Status: DC
  Administered 2018-02-22: 1 via TOPICAL
  Administered 2018-02-23: 23:00:00 via TOPICAL
  Administered 2018-02-23: 1 via TOPICAL
  Administered 2018-02-24 (×2): 31.5556 via TOPICAL
  Administered 2018-02-25: 1 via TOPICAL
  Administered 2018-02-25 – 2018-03-05 (×15): via TOPICAL
  Administered 2018-03-05: 1 via TOPICAL
  Administered 2018-03-06 – 2018-03-12 (×13): via TOPICAL
  Filled 2018-02-22 (×2): qty 28.4

## 2018-02-22 MED ORDER — SODIUM CHLORIDE 0.9 % IV SOLN
INTRAVENOUS | Status: AC
  Filled 2018-02-22: qty 1.2

## 2018-02-22 MED ORDER — IODIXANOL 320 MG/ML IV SOLN
INTRAVENOUS | Status: DC | PRN
  Administered 2018-02-22: 22 mL via INTRAVENOUS

## 2018-02-22 MED ORDER — FENTANYL 2500MCG IN NS 250ML (10MCG/ML) PREMIX INFUSION
25.0000 ug/h | INTRAVENOUS | Status: DC
  Administered 2018-02-22: 100 ug/h via INTRAVENOUS
  Administered 2018-02-22: 50 ug/h via INTRAVENOUS
  Filled 2018-02-22 (×2): qty 250

## 2018-02-22 MED ORDER — VASOPRESSIN 20 UNIT/ML IV SOLN
INTRAVENOUS | Status: DC | PRN
  Administered 2018-02-22 (×3): 1 [IU] via INTRAVENOUS

## 2018-02-22 MED ORDER — MIDAZOLAM HCL 2 MG/2ML IJ SOLN
INTRAMUSCULAR | Status: DC | PRN
  Administered 2018-02-22: 2 mg via INTRAVENOUS

## 2018-02-22 MED ORDER — LORAZEPAM 2 MG/ML IJ SOLN: 1.0000 mg | Freq: Once | INTRAMUSCULAR | Status: DC

## 2018-02-22 MED ORDER — VECURONIUM BROMIDE 10 MG IV SOLR
INTRAVENOUS | Status: AC
  Filled 2018-02-22: qty 10

## 2018-02-22 MED ORDER — 0.9 % SODIUM CHLORIDE (POUR BTL) OPTIME
TOPICAL | Status: DC | PRN
  Administered 2018-02-22: 2000 mL

## 2018-02-22 MED ORDER — FENTANYL 2500MCG IN NS 250ML (10MCG/ML) PREMIX INFUSION: 25.0000 ug/h | INTRAVENOUS | Status: DC

## 2018-02-22 SURGICAL SUPPLY — 106 items
BAG BANDED W/RUBBER/TAPE 36X54 (MISCELLANEOUS) ×5 IMPLANT
BAG URINE DRAINAGE (UROLOGICAL SUPPLIES) IMPLANT
BALLN MUSTANG 6.0X40 135 (BALLOONS) ×4
BALLN MUSTANG 6.0X40 135CM (BALLOONS) ×1
BALLOON MUSTANG 6.0X40 135 (BALLOONS) IMPLANT
BLADE CLIPPER SURG (BLADE) IMPLANT
BLADE SURG 15 STRL LF DISP TIS (BLADE) ×3 IMPLANT
BLADE SURG 15 STRL SS (BLADE)
CANISTER SUCT 3000ML PPV (MISCELLANEOUS) ×5 IMPLANT
CATH FOLEY 2WAY SLVR  5CC 16FR (CATHETERS)
CATH FOLEY 2WAY SLVR 5CC 16FR (CATHETERS) IMPLANT
CATH OMNI FLUSH 5F 65CM (CATHETERS) ×2 IMPLANT
CATH QUICKCROSS .035X135CM (MICROCATHETER) ×2 IMPLANT
CATH QUICKCROSS SUPP .035X90CM (MICROCATHETER) IMPLANT
CLIP VESOCCLUDE MED 24/CT (CLIP) ×2 IMPLANT
CLIP VESOCCLUDE SM WIDE 24/CT (CLIP) ×2 IMPLANT
CLOSURE MYNX CONTROL 6F/7F (Vascular Products) ×2 IMPLANT
COVER BACK TABLE 60X90IN (DRAPES) ×3 IMPLANT
COVER DOME SNAP 22 D (MISCELLANEOUS) ×5 IMPLANT
COVER MAYO STAND STRL (DRAPES) ×3 IMPLANT
COVER PROBE W GEL 5X96 (DRAPES) ×2 IMPLANT
COVER TABLE BACK 60X90 (DRAPES) ×5 IMPLANT
COVER WAND RF STERILE (DRAPES) ×9 IMPLANT
DERMABOND ADVANCED (GAUZE/BANDAGES/DRESSINGS) ×2
DERMABOND ADVANCED .7 DNX12 (GAUZE/BANDAGES/DRESSINGS) ×3 IMPLANT
DEVICE TORQUE KENDALL .025-038 (MISCELLANEOUS) IMPLANT
DRAIN PENROSE 18X1/2 LTX STRL (DRAIN) ×3 IMPLANT
DRAPE C-ARM 42X72 X-RAY (DRAPES) IMPLANT
DRAPE FEMORAL ANGIO 80X135IN (DRAPES) IMPLANT
DRAPE LAPAROTOMY 100X72 PEDS (DRAPES) ×3 IMPLANT
DRSG TEGADERM 4X4.75 (GAUZE/BANDAGES/DRESSINGS) IMPLANT
ELECT NDL TIP 2.8 STRL (NEEDLE) ×3 IMPLANT
ELECT NEEDLE TIP 2.8 STRL (NEEDLE) ×5 IMPLANT
ELECT REM PT RETURN 9FT ADLT (ELECTROSURGICAL) ×5
ELECTRODE REM PT RTRN 9FT ADLT (ELECTROSURGICAL) ×6 IMPLANT
GAUZE 4X4 16PLY RFD (DISPOSABLE) IMPLANT
GAUZE SPONGE 4X4 12PLY STRL (GAUZE/BANDAGES/DRESSINGS) ×2 IMPLANT
GLIDEWIRE ADV .035X260CM (WIRE) ×2 IMPLANT
GLOVE BIO SURGEON STRL SZ7.5 (GLOVE) ×5 IMPLANT
GLOVE BIO SURGEON STRL SZ8 (GLOVE) ×5 IMPLANT
GLOVE BIOGEL M 7.0 STRL (GLOVE) ×2 IMPLANT
GLOVE BIOGEL PI IND STRL 6.5 (GLOVE) IMPLANT
GLOVE BIOGEL PI IND STRL 7.0 (GLOVE) IMPLANT
GLOVE BIOGEL PI INDICATOR 6.5 (GLOVE) ×2
GLOVE BIOGEL PI INDICATOR 7.0 (GLOVE) ×2
GOWN STRL REUS W/ TWL LRG LVL3 (GOWN DISPOSABLE) ×9 IMPLANT
GOWN STRL REUS W/ TWL XL LVL3 (GOWN DISPOSABLE) ×6 IMPLANT
GOWN STRL REUS W/TWL LRG LVL3 (GOWN DISPOSABLE) ×4
GOWN STRL REUS W/TWL XL LVL3 (GOWN DISPOSABLE) ×4
GUIDEWIRE ANGLED .035X150CM (WIRE) IMPLANT
KIT BASIN OR (CUSTOM PROCEDURE TRAY) ×10 IMPLANT
KIT ENCORE 26 ADVANTAGE (KITS) ×2 IMPLANT
KIT TURNOVER KIT B (KITS) ×10 IMPLANT
MANIFOLD NEPTUNE II (INSTRUMENTS) IMPLANT
NDL HYPO 25X1 1.5 SAFETY (NEEDLE) ×3 IMPLANT
NDL PERC 18GX7CM (NEEDLE) IMPLANT
NEEDLE HYPO 25X1 1.5 SAFETY (NEEDLE) IMPLANT
NEEDLE PERC 18GX7CM (NEEDLE) IMPLANT
NS IRRIG 1000ML POUR BTL (IV SOLUTION) ×10 IMPLANT
PACK PERIPHERAL VASCULAR (CUSTOM PROCEDURE TRAY) ×2 IMPLANT
PAD ARMBOARD 7.5X6 YLW CONV (MISCELLANEOUS) ×5 IMPLANT
PENCIL BUTTON HOLSTER BLD 10FT (ELECTRODE) ×3 IMPLANT
SET MICROPUNCTURE 5F STIFF (MISCELLANEOUS) ×4 IMPLANT
SHEATH AVANTI 11CM 5FR (SHEATH) ×3 IMPLANT
SHEATH AVANTI 11CM 8FR (SHEATH) IMPLANT
SHEATH PINNACLE 6F 10CM (SHEATH) ×4 IMPLANT
STENT VIABAHN 6X50X120 (Permanent Stent) ×2 IMPLANT
STOPCOCK MORSE 400PSI 3WAY (MISCELLANEOUS) IMPLANT
SUPPORT SCROTAL LG STRP (MISCELLANEOUS) ×3 IMPLANT
SUPPORTER ATHLETIC LG (MISCELLANEOUS)
SUT CHROMIC 3 0 SH 27 (SUTURE) IMPLANT
SUT CHROMIC 4 0 PS 2 18 (SUTURE) IMPLANT
SUT CHROMIC 4 0 SH 27 (SUTURE) IMPLANT
SUT CHROMIC GUT AB #0 18 (SUTURE) IMPLANT
SUT MNCRL AB 4-0 PS2 18 (SUTURE) IMPLANT
SUT PROLENE 4 0 RB 1 (SUTURE)
SUT PROLENE 4-0 RB1 .5 CRCL 36 (SUTURE) IMPLANT
SUT PROLENE 5 0 C 1 24 (SUTURE) ×2 IMPLANT
SUT PROLENE 6 0 BV (SUTURE) ×2 IMPLANT
SUT PROLENE 6 0 CC (SUTURE) IMPLANT
SUT SILK 0 SH 30 (SUTURE) IMPLANT
SUT SILK 0 TIES 10X30 (SUTURE) IMPLANT
SUT VIC AB 2-0 CTX 36 (SUTURE) IMPLANT
SUT VIC AB 2-0 SH 27 (SUTURE)
SUT VIC AB 2-0 SH 27XBRD (SUTURE) IMPLANT
SUT VIC AB 3-0 SH 27 (SUTURE) ×4
SUT VIC AB 3-0 SH 27X BRD (SUTURE) IMPLANT
SUT VICRYL 2 0 18  UND BR (SUTURE)
SUT VICRYL 2 0 18 UND BR (SUTURE) IMPLANT
SUT VICRYL 4-0 PS2 18IN ABS (SUTURE) IMPLANT
SYR 10ML LL (SYRINGE) ×12 IMPLANT
SYR 20CC LL (SYRINGE) ×5 IMPLANT
SYR 30ML LL (SYRINGE) IMPLANT
SYR CONTROL 10ML LL (SYRINGE) ×3 IMPLANT
SYR MEDRAD MARK V 150ML (SYRINGE) IMPLANT
TAPE CLOTH SURG 4X10 WHT LF (GAUZE/BANDAGES/DRESSINGS) ×2 IMPLANT
TOWEL GREEN STERILE (TOWEL DISPOSABLE) ×5 IMPLANT
TRAY FOLEY MTR SLVR 16FR STAT (SET/KITS/TRAYS/PACK) ×2 IMPLANT
TUBE CONNECTING 12'X1/4 (SUCTIONS)
TUBE CONNECTING 12X1/4 (SUCTIONS) IMPLANT
TUBING EXTENTION W/L.L. (IV SETS) ×3 IMPLANT
TUBING HIGH PRESSURE 120CM (CONNECTOR) IMPLANT
WATER STERILE IRR 1000ML POUR (IV SOLUTION) ×5 IMPLANT
WIRE BENTSON .035X145CM (WIRE) ×7 IMPLANT
WIRE G V18X300CM (WIRE) ×2 IMPLANT
YANKAUER SUCT BULB TIP NO VENT (SUCTIONS) IMPLANT
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
## 2018-02-22 NOTE — Transfer of Care (Signed)
Immediate Anesthesia Transfer of Care Note  Patient: Austin Gates  Procedure(s) Performed: INSERTION LEFT SUPERIOR FEMORAL ARTERY USING 6MM X 5CM VIABHON STENT with mynx device closure on right femoral artery (Left Groin) left lower EXTREMITY ANGIOGRAM (Left Groin) SCROTUM EXPLORATION (N/A Scrotum) bilateral HYDROCELECTOMY ADULT (Bilateral Scrotum)  Patient Location: ICU  Anesthesia Type:General  Level of Consciousness: sedated and unresponsive  Airway & Oxygen Therapy: Patient remains intubated per anesthesia plan and Patient placed on Ventilator (see vital sign flow sheet for setting)  Post-op Assessment: Report given to RN and Post -op Vital signs reviewed and stable  Post vital signs: Reviewed and stable  Last Vitals:  Vitals Value Taken Time  BP 143/104 02/22/2018  2:11 PM  Temp    Pulse 53 02/22/2018  2:12 PM  Resp 16 02/22/2018  2:13 PM  SpO2 100 % 02/22/2018  2:12 PM  Vitals shown include unvalidated device data.  Last Pain:  Vitals:   02/22/18 0833  TempSrc: (S) Rectal  PainSc:          Complications: No apparent anesthesia complications

## 2018-02-22 NOTE — Progress Notes (Signed)
RT NOTE: Patient arrived to the unit and placed on previous settings from ED. RT will obtain ABG in one hour per protocol.
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
## 2018-02-22 NOTE — Anesthesia Postprocedure Evaluation (Signed)
Anesthesia Post Note  Patient: Austin Gates  Procedure(s) Performed: INSERTION LEFT SUPERIOR FEMORAL ARTERY USING 6MM X 5CM VIABHON STENT with mynx device closure on right femoral artery (Left Groin) left lower EXTREMITY ANGIOGRAM (Left Groin) SCROTUM EXPLORATION (N/A Scrotum) bilateral HYDROCELECTOMY ADULT (Bilateral Scrotum)     Patient location during evaluation: SICU Anesthesia Type: General Level of consciousness: sedated Pain management: pain level controlled Vital Signs Assessment: post-procedure vital signs reviewed and stable Respiratory status: patient remains intubated per anesthesia plan Cardiovascular status: stable Postop Assessment: no apparent nausea or vomiting Anesthetic complications: no    Last Vitals:  Vitals:   02/22/18 1430 02/22/18 1445  BP: (!) 133/98 126/90  Pulse: 63 69  Resp: 16 14  Temp:    SpO2: 100% 100%    Last Pain:  Vitals:   02/22/18 1400  TempSrc: Rectal  PainSc:                  Austin Gates,W. EDMOND

## 2018-02-22 NOTE — ED Notes (Signed)
Tourniquet to L thigh removed.
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
## 2018-02-22 NOTE — ED Notes (Signed)
To CT

## 2018-02-22 NOTE — Progress Notes (Signed)
Responded to referral to continue support to patient.  Family arrived and was placed in consult B room.   EDP gave update of patient status to patient Sister and brother in law.  Sister is legal guardian due to patient life long history of mental health issues.  Accompanied EDP to speak with family and then escorted family to bedside.  Supported family with presence, empathetic listening and emotional support..  Will follow as needed.  Patient going to surgery.  Jaclynn Major, [place], [facility], Pager [phone]

## 2018-02-22 NOTE — ED Notes (Signed)
Ativan 1 mg given IV  on way to OR
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
## 2018-02-22 NOTE — Anesthesia Procedure Notes (Signed)
Arterial Line Insertion Start/End1/03/2018 11:45 AM, 02/22/2018 11:50 AM Performed by: Roderic Palau, MD, anesthesiologist  Patient location: Pre-op. Preanesthetic checklist: patient identified, IV checked, site marked, risks and benefits discussed, surgical consent, monitors and equipment checked, pre-op evaluation, timeout performed and anesthesia consent Lidocaine 1% used for infiltration Left, radial was placed Catheter size: 20 Fr Hand hygiene performed  and maximum sterile barriers used   Attempts: 3 Procedure performed without using ultrasound guided technique. Following insertion, dressing applied and Biopatch. Post procedure assessment: normal and unchanged  Patient tolerated the procedure well with no immediate complications.

## 2018-02-22 NOTE — ED Notes (Signed)
Intubation complete, 25 at lip.
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
## 2018-02-22 NOTE — ED Notes (Signed)
Family at beside. Family given emotional support. CSI at bedside also-- Chaplain has been with family

## 2018-02-22 NOTE — Progress Notes (Signed)
Patient ID: Austin Gates, male   DOB: 10-08-1960, 58 y.o.   MRN: [id number] I spoke with his family at the bedside. They report he has had speech and swallowing issues over the past few months. Will plan ST eval after he is extubated. Will also plan psychiatry eval as well.  Georganna Skeans, MD, MPH, FACS Trauma: [phone] General Surgery: [phone]

## 2018-02-22 NOTE — ED Notes (Signed)
Pt is moving, reaching for tubes, mittens placed - propofol started.

## 2018-02-23 ENCOUNTER — Telehealth: Payer: Self-pay | Admitting: Vascular Surgery

## 2018-02-23 ENCOUNTER — Encounter (HOSPITAL_COMMUNITY): Payer: Self-pay | Admitting: Vascular Surgery

## 2018-02-23 ENCOUNTER — Inpatient Hospital Stay (HOSPITAL_COMMUNITY): Payer: Medicaid Other

## 2018-02-23 LAB — BASIC METABOLIC PANEL
Anion gap: 7 (ref 5–15)
BUN: 19 mg/dL (ref 6–20)
CO2: 23 mmol/L (ref 22–32)
Calcium: 8.3 mg/dL — ABNORMAL LOW (ref 8.9–10.3)
Chloride: 114 mmol/L — ABNORMAL HIGH (ref 98–111)
Creatinine, Ser: 1.05 mg/dL (ref 0.61–1.24)
GFR calc Af Amer: >60 mL/min (ref >60)
GFR calc non Af Amer: >60 mL/min (ref >60)
Glucose, Bld: 92 mg/dL (ref 70–99)
Potassium: 4 mmol/L (ref 3.5–5.1)
SODIUM: 144 mmol/L (ref 135–145)

## 2018-02-23 LAB — CBC
HCT: 29.5 % — ABNORMAL LOW (ref 39.0–52.0)
Hemoglobin: 9.8 g/dL — ABNORMAL LOW (ref 13.0–17.0)
MCH: 30.8 pg (ref 26.0–34.0)
MCHC: 33.2 g/dL (ref 30.0–36.0)
MCV: 92.8 fL (ref 80.0–100.0)
PLATELETS: 139 10*3/uL — AB (ref 150–400)
RBC: 3.18 MIL/uL — ABNORMAL LOW (ref 4.22–5.81)
RDW: 16.2 % — ABNORMAL HIGH (ref 11.5–15.5)
WBC: 7 10*3/uL (ref 4.0–10.5)
nRBC: 0 % (ref 0.0–0.2)

## 2018-02-23 LAB — HIV ANTIBODY (ROUTINE TESTING W REFLEX): HIV SCREEN 4TH GENERATION: NONREACTIVE

## 2018-02-23 MED ORDER — FENTANYL CITRATE (PF) 100 MCG/2ML IJ SOLN: 50.0000 ug | INTRAMUSCULAR | Status: DC | PRN

## 2018-02-23 MED ORDER — ACETAMINOPHEN 325 MG PO TABS
650.0000 mg | ORAL_TABLET | Freq: Four times a day (QID) | ORAL | Status: DC | PRN
  Administered 2018-02-24 – 2018-03-12 (×8): 650 mg via ORAL
  Filled 2018-02-23 (×8): qty 2

## 2018-02-23 MED ORDER — OXYCODONE HCL 5 MG PO TABS
5.0000 mg | ORAL_TABLET | ORAL | Status: DC | PRN
  Administered 2018-02-27: 10 mg via ORAL
  Filled 2018-02-23: qty 2

## 2018-02-23 MED ORDER — ORAL CARE MOUTH RINSE
15.0000 mL | OROMUCOSAL | Status: DC
  Administered 2018-02-24 – 2018-02-27 (×12): 15 mL via OROMUCOSAL

## 2018-02-23 MED ORDER — MIDAZOLAM HCL 2 MG/2ML IJ SOLN: 2.0000 mg | INTRAMUSCULAR | Status: DC | PRN

## 2018-02-23 MED ORDER — ENOXAPARIN SODIUM 40 MG/0.4ML ~~LOC~~ SOLN
40.0000 mg | SUBCUTANEOUS | Status: DC
Start: 2018-02-23 — End: 2018-03-13
  Administered 2018-02-23 – 2018-03-12 (×18): 40 mg via SUBCUTANEOUS
  Filled 2018-02-23 (×18): qty 0.4

## 2018-02-23 NOTE — Procedures (Signed)
Extubation Procedure Note  Patient Details:   Name: Austin Gates DOB: [birth year] MRN: [id number]   Airway Documentation:    Vent end date: 02/23/18 Vent end time: 1408   Evaluation  O2 sats: stable throughout Complications: No apparent complications Patient did tolerate procedure well. Bilateral Breath Sounds: Rhonchi   Yes   Patient extubated per order to 3L [place] with no apparent complications. Positive cuff leak was noted prior to extubation. Patient is alert and is able to speak and follow commands. Vitals are stable. RT will continue to monitor.   Chanelle Hodsdon Clyda Greener 02/23/2018, 2:16 PM
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
## 2018-02-23 NOTE — Progress Notes (Signed)
This note also relates to the following rows which could not be included: SpO2 - Cannot attach notes to unvalidated device data  RT NOTE: RT called by RN to re-attempt wean as patient is alert and sedation has been lowered. RT placed patient on wean but patient has very low effort, with RR of 4-5. Low MVE and RR alarms with ventilator back up mode coming on to ventilate patient. RT placed patient back on full support. Vitals are stable. RT will continue to monitor.
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
## 2018-02-23 NOTE — Telephone Encounter (Signed)
sch appt spk to pt wife mld ltr 04/06/2018 10am ABI 11am LLE Art 1130 p/o MD

## 2018-02-23 NOTE — Telephone Encounter (Signed)
-----   Message from Waynetta Sandy, MD sent at 02/23/2018  8:38 AM EST ----- [name] [phone] [birth year]  F/u in 4-6 weeks with left lower extremity arterial duplex and abi for recent left sfa stent

## 2018-02-24 ENCOUNTER — Inpatient Hospital Stay (HOSPITAL_COMMUNITY): Payer: Medicaid Other

## 2018-02-24 LAB — CBC
HCT: 31.5 % — ABNORMAL LOW (ref 39.0–52.0)
Hemoglobin: 10.2 g/dL — ABNORMAL LOW (ref 13.0–17.0)
MCH: 30.5 pg (ref 26.0–34.0)
MCHC: 32.4 g/dL (ref 30.0–36.0)
MCV: 94.3 fL (ref 80.0–100.0)
Platelets: 135 10*3/uL — ABNORMAL LOW (ref 150–400)
RBC: 3.34 MIL/uL — ABNORMAL LOW (ref 4.22–5.81)
RDW: 15.9 % — ABNORMAL HIGH (ref 11.5–15.5)
WBC: 7.2 10*3/uL (ref 4.0–10.5)
nRBC: 0 % (ref 0.0–0.2)

## 2018-02-24 MED ORDER — CLOZAPINE 100 MG PO TABS
100.0000 mg | ORAL_TABLET | Freq: Every day | ORAL | Status: DC
  Administered 2018-02-24 – 2018-03-12 (×17): 100 mg via ORAL
  Filled 2018-02-24 (×18): qty 1

## 2018-02-24 MED ORDER — TRAZODONE HCL 100 MG PO TABS
100.0000 mg | ORAL_TABLET | Freq: Every day | ORAL | Status: DC
  Administered 2018-02-24 – 2018-03-12 (×17): 100 mg via ORAL
  Filled 2018-02-24 (×7): qty 1
  Filled 2018-02-24: qty 2
  Filled 2018-02-24 (×9): qty 1

## 2018-02-24 MED ORDER — DIVALPROEX SODIUM 500 MG PO DR TAB
500.0000 mg | DELAYED_RELEASE_TABLET | Freq: Every day | ORAL | Status: DC
  Administered 2018-02-24 – 2018-03-02 (×7): 500 mg via ORAL
  Filled 2018-02-24 (×8): qty 1

## 2018-02-24 MED ORDER — CLOPIDOGREL BISULFATE 75 MG PO TABS
75.0000 mg | ORAL_TABLET | Freq: Every day | ORAL | Status: DC
  Administered 2018-02-24 – 2018-03-12 (×17): 75 mg via ORAL
  Filled 2018-02-24 (×17): qty 1

## 2018-02-24 MED ORDER — CLOZAPINE 25 MG PO TABS
50.0000 mg | ORAL_TABLET | Freq: Every morning | ORAL | Status: DC
  Administered 2018-02-24 – 2018-03-12 (×17): 50 mg via ORAL
  Filled 2018-02-24 (×18): qty 2
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
## 2018-02-24 NOTE — Progress Notes (Signed)
2+ DP pulses Plavix when appropriate Follow up Dr Donzetta Matters Will sign off  Ruta Hinds, MD [facility] [place] Office: [phone] Pager: [phone]
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
## 2018-02-24 NOTE — Progress Notes (Signed)
Modified Barium Swallow Progress Note  Patient Details  Name: Austin Gates MRN: [id number] Date of Birth: Aug 20, 1960  Today's Date: 02/24/2018  Modified Barium Swallow completed.  Full report located under Chart Review in the Imaging Section.  Brief recommendations include the following:  Clinical Impression  Pt presents with what appears to be a chronic, moderate-severe oropharyngeal dysphagia, with premature spillage before the swallowing leading to airway compromise with all consistencies tested (thin, nectar, honey-thick liquids and puree). There is significant anterior loss of saliva/liquids. Sensory deficits, with pt requiring verbal and occasional tactile cues to initiate swallow at least 50% of the time, and prolonged pooling in the valleculae/pyriform sinuses. Pt was fed by clinician via teaspoon. Only puree was not aspirated. There was one instance of penetration with puree (significant, when bolus spilled directly into laryngeal vestibule, with 50% ejected during the swallow and the remainder ejected via reflexive cough post-swallow), otherwise small 1/2-3/4 teaspoon bites were well-contained in the valleculae without further penetration or aspiration. Swallow initiation with honey-thick liquids was occasionally triggered at the valleculae, however aspiration occurred with intermittent delays to pyriform sinuses. SLP questioned whether initiation might be more timely with pt self-feeding, however when pt self-fed honey thick liquids he was impulsive even with max A, and there was copious aspiration with minimal response from patient. Pt at high risk for aspiration. Advise puree diet with pudding thick liquids by teaspoon, with careful hand-feeding and cues to initiate swallow when needed. Crush meds in pudding. Clear throat/cough occasionally. Will follow for tolerance and education. Consider palliative consult  *No history of dysphagia in current chart, however chart is marked for merge in  EPIC, which shows recent admission at [facility] where pt was evaluated by SLP with MBS on 11/23/17, findings of moderate-severe oropharyngeal dysphagia. PEG placement was considered, however [name] opted for pt to have food for pleasure. MBS findings today are similar to this report.   Swallow Evaluation Recommendations   Recommended Consults: Other (Comment)(palliative consult)   SLP Diet Recommendations: Dysphagia 1 (Puree) solids;Pudding thick liquid   Liquid Administration via: Spoon   Medication Administration: Crushed with puree   Supervision: Staff to assist with self feeding;Full supervision/cueing for compensatory strategies   Compensations: Slow rate;Small sips/bites;Minimize environmental distractions;Clear throat intermittently   Postural Changes: Remain semi-upright after after feeds/meals (Comment);Seated upright at 90 degrees   Oral Care Recommendations: Oral care QID   Other Recommendations: Remove water pitcher;Have oral suction available   Deneise Lever, [place], [place] Pager: [phone] Office: [phone]  Aliene Altes 02/24/2018,1:01 PM

## 2018-02-24 NOTE — Progress Notes (Signed)
Spoke with Dr. Grandville Silos regarding possible gastrografin prior to [facility]. Per Dr. Grandville Silos, no need for gastrografin and ok for SLP to proceed with MBS as soon as possible. Will attempt completion of MBS this date however if unable, will complete 1/5. Please call [phone] with questions.   [place] MA, CCC-SLP
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
## 2018-02-25 LAB — CBC
HCT: 34.3 % — ABNORMAL LOW (ref 39.0–52.0)
Hemoglobin: 10.9 g/dL — ABNORMAL LOW (ref 13.0–17.0)
MCH: 29.7 pg (ref 26.0–34.0)
MCHC: 31.8 g/dL (ref 30.0–36.0)
MCV: 93.5 fL (ref 80.0–100.0)
Platelets: 138 10*3/uL — ABNORMAL LOW (ref 150–400)
RBC: 3.67 MIL/uL — ABNORMAL LOW (ref 4.22–5.81)
RDW: 15 % (ref 11.5–15.5)
WBC: 4.9 10*3/uL (ref 4.0–10.5)
nRBC: 0 % (ref 0.0–0.2)

## 2018-02-25 LAB — TRIGLYCERIDES: Triglycerides: 104 mg/dL (ref <150)

## 2018-02-25 MED ORDER — MORPHINE SULFATE (PF) 2 MG/ML IV SOLN
1.0000 mg | INTRAVENOUS | Status: DC | PRN
  Administered 2018-02-25: 2 mg via INTRAVENOUS
  Filled 2018-02-25: qty 1

## 2018-02-25 MED ORDER — POLYETHYLENE GLYCOL 3350 17 G PO PACK: 17.0000 g | PACK | Freq: Every day | ORAL | Status: DC | PRN

## 2018-02-25 MED ORDER — DOCUSATE SODIUM 100 MG PO CAPS
100.0000 mg | ORAL_CAPSULE | Freq: Two times a day (BID) | ORAL | Status: DC
  Administered 2018-02-25 – 2018-03-01 (×9): 100 mg via ORAL
  Filled 2018-02-25 (×8): qty 1
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
## 2018-02-25 NOTE — Progress Notes (Signed)
CSW met with patient, guardian, and patient's family to discuss PT recommendation to SNF. CSW noted patient's guardian hesitation and provided psyco-education on SNF's. CSW noted guardian was receptive after learning more about SNF and stated she would prefer patient go to a facility in [place]. CSW notes they did not currently have a SSN for the patient available. CSW contacted the owner of the patient's ALF and they did not have it but would follow up with CSW soon. CSW has been unable to verify if patient has a PASSR or apply without the SSN. CSW faxed initial referrals to SNF facilities in [place]. CSW will continue to follow.  Lamonte Richer, LCSW, [place] Worker II [phone]
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
## 2018-02-25 NOTE — Clinical Social Work Note (Signed)
Clinical Social Work Assessment  Patient Details  Name: Austin Gates MRN: [id number] Date of Birth: [birth year]  Date of referral:  02/25/18               Reason for consult:  Facility Placement                Permission sought to share information with:  [place] granted to share information::  Yes, Verbal Permission Granted  Name::        Agency::     Relationship::     Contact Information:     Housing/Transportation Living arrangements for the past 2 months:  Chemical engineer) Source of Information:  Facility, Guardian, Siblings Patient Interpreter Needed:  None Criminal Activity/Legal Involvement Pertinent to Current Situation/Hospitalization:  No - Comment as needed Significant Relationships:  Warehouse manager, Siblings Lives with:  Facility Resident Do you feel safe going back to the place where you live?  Yes Need for family participation in patient care:  Yes (Comment)(Sister is GOP)  Care giving concerns: Patient has legal guardian. Patient presented quite confused. Patient recommended SNF and 24 hour assistance by PT.    Social Worker assessment / plan:  CSW working on SNF work-up to work towards patient discharging to a SNF. CSW notes patient lives in [place] and per legal guardian and family they request a preference towards a SNF in [place], secondary [place].  Employment status:  Disabled (Comment on whether or not currently receiving Disability) Insurance information:  Medicaid In [place] PT Recommendations:  [place], 24 Hour Supervision Information / Referral to community resources:     Patient/Family's Response to care:  Patient was labile and was unable to maintain conversation as he kept getting confused with the role of the CSW. Patient's family and guardian report openness to SNF placement.  Patient/Family's Understanding of and Emotional Response to Diagnosis, Current Treatment, and Prognosis:  CSW  noted some hesitancy with SNF placement. CSW explained SNF and noted with understanding the family and guardian had no additional concerns. CSW notes patient did not ask questions.   Emotional Assessment Appearance:  Appears older than stated age Attitude/Demeanor/Rapport:  Other(Patient was unable to be clearly determined as he was quite labile and inconsistent.) Affect (typically observed):  Restless, Agitated, Other Orientation:  (Unable to assess as patient struggled responding to CSW's prompts.) Alcohol / Substance use:  Not Applicable Psych involvement (Current and /or in the community):  Yes (Comment)(Pt has psych history. Unclear what current level of psych involvement is present.)  Discharge Needs  Concerns to be addressed:  No discharge needs identified Readmission within the last 30 days:  No Current discharge risk:  None Barriers to Discharge:  No Barriers Identified   Oretha Milch, LCSW 02/25/2018, 12:14 PM

## 2018-02-26 DIAGNOSIS — G4700 Insomnia, unspecified: Secondary | ICD-10-CM

## 2018-02-26 DIAGNOSIS — F250 Schizoaffective disorder, bipolar type: Secondary | ICD-10-CM

## 2018-02-26 LAB — TYPE AND SCREEN
ABO/RH(D): B POS
Antibody Screen: NEGATIVE
Unit division: 0
Unit division: 0
Unit division: 0
Unit division: 0
Unit division: 0
Unit division: 0

## 2018-02-26 LAB — BPAM RBC
Blood Product Expiration Date: 202001282359
Blood Product Expiration Date: 202001282359
Blood Product Expiration Date: 202001282359
Blood Product Expiration Date: 202001282359
Blood Product Expiration Date: 202001292359
Blood Product Expiration Date: 202001292359
ISSUE DATE / TIME: 202001021156
ISSUE DATE / TIME: 202001021222
ISSUE DATE / TIME: 202001021222
ISSUE DATE / TIME: 202001020705
Unit Type and Rh: 5100
Unit Type and Rh: 5100
Unit Type and Rh: 5100
Unit Type and Rh: 5100
Unit Type and Rh: 5100
Unit Type and Rh: 5100

## 2018-02-26 LAB — CBC
HEMATOCRIT: 32.5 % — AB (ref 39.0–52.0)
Hemoglobin: 10.4 g/dL — ABNORMAL LOW (ref 13.0–17.0)
MCH: 30.1 pg (ref 26.0–34.0)
MCHC: 32 g/dL (ref 30.0–36.0)
MCV: 93.9 fL (ref 80.0–100.0)
Platelets: 164 10*3/uL (ref 150–400)
RBC: 3.46 MIL/uL — ABNORMAL LOW (ref 4.22–5.81)
RDW: 14.6 % (ref 11.5–15.5)
WBC: 5.2 10*3/uL (ref 4.0–10.5)
nRBC: 0 % (ref 0.0–0.2)

## 2018-02-26 LAB — BASIC METABOLIC PANEL
Anion gap: 4 — ABNORMAL LOW (ref 5–15)
BUN: 17 mg/dL (ref 6–20)
CO2: 25 mmol/L (ref 22–32)
Calcium: 9.1 mg/dL (ref 8.9–10.3)
Chloride: 112 mmol/L — ABNORMAL HIGH (ref 98–111)
Creatinine, Ser: 0.84 mg/dL (ref 0.61–1.24)
GFR calc Af Amer: >60 mL/min (ref >60)
GFR calc non Af Amer: >60 mL/min (ref >60)
GLUCOSE: 91 mg/dL (ref 70–99)
Potassium: 4.3 mmol/L (ref 3.5–5.1)
Sodium: 141 mmol/L (ref 135–145)

## 2018-02-26 LAB — BLOOD PRODUCT ORDER (VERBAL) VERIFICATION

## 2018-02-26 MED ORDER — POTASSIUM CHLORIDE IN NACL 20-0.9 MEQ/L-% IV SOLN: INTRAVENOUS | Status: DC | PRN

## 2018-02-26 MED ORDER — RESOURCE THICKENUP CLEAR PO POWD
ORAL | Status: DC | PRN
  Filled 2018-02-26: qty 125

## 2018-02-26 MED ORDER — CEPHALEXIN 500 MG PO CAPS
500.0000 mg | ORAL_CAPSULE | Freq: Two times a day (BID) | ORAL | Status: DC
  Administered 2018-02-26 – 2018-02-27 (×2): 500 mg via ORAL
  Filled 2018-02-26 (×2): qty 1

## 2018-02-26 NOTE — Progress Notes (Signed)
OT Cancellation Note  Patient Details Name: Austin Gates MRN: [id number] DOB: 1960-05-13   Cancelled Treatment:    Reason Eval/Treat Not Completed: Other (comment)(agitated, deferred at this time.)  Jaci Carrel 02/26/2018, 5:32 PM
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
## 2018-02-26 NOTE — Plan of Care (Signed)
  Problem: Nutrition: Goal: Adequate nutrition will be maintained Outcome: Progressing   Problem: Coping: Goal: Level of anxiety will decrease Outcome: Progressing   Problem: Elimination: Goal: Will not experience complications related to bowel motility Outcome: Progressing Goal: Will not experience complications related to urinary retention Outcome: Progressing   

## 2018-02-27 LAB — CBC WITH DIFFERENTIAL/PLATELET
Abs Immature Granulocytes: 0.06 10*3/uL (ref 0.00–0.07)
Basophils Absolute: 0 10*3/uL (ref 0.0–0.1)
Basophils Relative: 1 %
EOS ABS: 0.1 10*3/uL (ref 0.0–0.5)
EOS PCT: 1 %
HCT: 32.8 % — ABNORMAL LOW (ref 39.0–52.0)
Hemoglobin: 10.4 g/dL — ABNORMAL LOW (ref 13.0–17.0)
Immature Granulocytes: 1 %
Lymphocytes Relative: 20 %
Lymphs Abs: 1.2 10*3/uL (ref 0.7–4.0)
MCH: 30.1 pg (ref 26.0–34.0)
MCHC: 31.7 g/dL (ref 30.0–36.0)
MCV: 94.8 fL (ref 80.0–100.0)
Monocytes Absolute: 0.5 10*3/uL (ref 0.1–1.0)
Monocytes Relative: 9 %
Neutro Abs: 4.2 10*3/uL (ref 1.7–7.7)
Neutrophils Relative %: 68 %
Platelets: 171 10*3/uL (ref 150–400)
RBC: 3.46 MIL/uL — ABNORMAL LOW (ref 4.22–5.81)
RDW: 14.3 % (ref 11.5–15.5)
WBC: 6.1 10*3/uL (ref 4.0–10.5)
nRBC: 0.3 % — ABNORMAL HIGH (ref 0.0–0.2)

## 2018-02-27 MED ORDER — HALOPERIDOL LACTATE 5 MG/ML IJ SOLN
5.0000 mg | Freq: Four times a day (QID) | INTRAMUSCULAR | Status: DC | PRN
  Administered 2018-02-27: 5 mg via INTRAVENOUS

## 2018-02-27 MED ORDER — CEPHALEXIN 250 MG/5ML PO SUSR
500.0000 mg | Freq: Two times a day (BID) | ORAL | Status: AC
  Administered 2018-02-27 – 2018-03-02 (×8): 500 mg via ORAL
  Filled 2018-02-27 (×9): qty 10

## 2018-02-27 MED ORDER — HALOPERIDOL LACTATE 5 MG/ML IJ SOLN
INTRAMUSCULAR | Status: AC
  Administered 2018-02-27: 11:00:00
  Filled 2018-02-27: qty 1

## 2018-02-27 MED ORDER — OXYCODONE HCL 5 MG PO TABS
5.0000 mg | ORAL_TABLET | ORAL | Status: DC | PRN
  Administered 2018-02-27 – 2018-03-01 (×5): 5 mg via ORAL
  Filled 2018-02-27 (×5): qty 1

## 2018-02-27 MED ORDER — HALOPERIDOL LACTATE 5 MG/ML IJ SOLN
5.0000 mg | Freq: Four times a day (QID) | INTRAMUSCULAR | Status: DC | PRN
  Administered 2018-02-27 – 2018-02-28 (×4): 5 mg via INTRAMUSCULAR
  Filled 2018-02-27 (×3): qty 1
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
## 2018-02-27 NOTE — Progress Notes (Signed)
SLP Cancellation Note  Patient Details Name: Austin Gates MRN: [id number] DOB: September 10, 1960   Cancelled treatment:       Reason Eval/Treat Not Completed: Other (comment) - No family present at 64. Will continue efforts to meet with family regarding diet tolerance/advancement and education.  Austin Gates B. Quentin Ore [facility], CCC-SLP Speech Language Pathologist [phone]  Austin Gates 02/27/2018, 10:10 AM

## 2018-02-27 NOTE — Progress Notes (Signed)
   Patient has palpable pedal pulses right groin is soft without hematoma.  Plan will be for follow-up in 4 to 6 weeks with duplex of his stent.  Continue Plavix.  Please call with questions.  Beauford Lando C. Donzetta Matters, MD [facility] Office: [phone] Pager: [phone]

## 2018-02-27 NOTE — Progress Notes (Signed)
This nurse responded to bed alarm and noted pt standing in the hall with no clothes on. Pt had also removed condom catheter. PT was assisted back to bed by this nurse and tech. Pt denied pain or discomfort and was agreeable to have gown on. Will continue to monitor. Katherina Right, RN.

## 2018-02-27 NOTE — Progress Notes (Signed)
Patient sister (who is the guardian) called for an update about her brother's evening.   Nurse updated.  Nurse spoke with sister about code status, consulting to palliative in order for him to eat to match his appetite and medication reconciliation.   Sister told Nurse that last September patient was placed in behavior health facility and all medications related to bipolar and schizophrenic diagnosis were discontinued because of extreme weight loss and low blood pressure.  Sister unaware if those medications have been restarted but patient does not have them listed on [facility] for this admission.   Physician to speak to family today, sister is on the way and will be here after 10 am.   Can patient have pysch consult for medication reconciliation related to mental health diagnosis?

## 2018-02-27 NOTE — Clinical Social Work Note (Signed)
Clinical Social Worker continuing to follow patient and family for support and discharge planning needs.  Per therapy recommendations, patient will likely need SNF placement at discharge, however due to patient IDD diagnosis and recent behaviors, limited placement options at this time.  CSW spoke with patient [facility] Care Coordinator, Elta Guadeloupe, who states that patient has a long standing psychiatric history and [facility] team has been attempting to get him placed at [facility] until this incident.  Patient care coordinator confirms that patient is in need of a new medication regimen as he has been delusional and aggressive in his previous setting.  Patient guardian, his sister, present at bedside and confirms that patient needs new medication adjustments.  Patient sister provides additional permission for staff to contact Care Coordinator and provide any necessary information.  CSW remains availble fors support and to facilitate patient discharge needs once medically stable.  Barbette Or, [place]
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
## 2018-02-28 MED ORDER — LORAZEPAM 1 MG PO TABS
1.0000 mg | ORAL_TABLET | Freq: Four times a day (QID) | ORAL | Status: DC | PRN
  Administered 2018-02-28 – 2018-03-12 (×17): 1 mg via ORAL
  Filled 2018-02-28 (×19): qty 1
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
## 2018-02-28 NOTE — Progress Notes (Signed)
Pt becoming more aggressive and hyperactive.  Provider notified.  RN will continue to monitor.
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
## 2018-02-28 NOTE — Progress Notes (Signed)
Pt is laughing uncontrollably at times.  MD team aware. Pt also grabbing at RN while in room and confuse at times. PRN given but unchanged.  No sitter at this time.  RN will continue to monitor.
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
## 2018-03-01 ENCOUNTER — Ambulatory Visit: Admission: RE | Admit: 2018-03-01 | Payer: Medicaid Other | Source: Home / Self Care | Admitting: Gastroenterology

## 2018-03-01 ENCOUNTER — Encounter: Admission: RE | Payer: Self-pay | Source: Home / Self Care

## 2018-03-01 SURGERY — COLONOSCOPY WITH PROPOFOL
Anesthesia: General
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
## 2018-03-01 NOTE — Clinical Social Work Note (Signed)
Clinical Social Worker continuing to follow patient and family for support and discharge planning needs.  Patient no longer requiring physical assistance and is supervision level only.  CSW working with Zipporah Plants from [place] (patient IDD Coordinator) on plan of care at discharge.  Mark requesting another full psychiatric evaluation for medication management and potential eligibility for [facility] has contacted Psychiatrist and await return call to further discuss.  Patient not able to return to previous living situation at this time-actively working with [facility] to determine patient next venue for the safety of patient and staff.  CSW remains available for support and to facilitate patient discharge needs.  Barbette Or, [place]
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
## 2018-03-02 ENCOUNTER — Other Ambulatory Visit: Payer: Self-pay

## 2018-03-02 DIAGNOSIS — W3400XA Accidental discharge from unspecified firearms or gun, initial encounter: Secondary | ICD-10-CM

## 2018-03-02 DIAGNOSIS — Z48812 Encounter for surgical aftercare following surgery on the circulatory system: Secondary | ICD-10-CM

## 2018-03-02 MED ORDER — OXYCODONE HCL 5 MG PO TABS: 2.5000 mg | ORAL_TABLET | ORAL | Status: DC | PRN
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
## 2018-03-03 MED ORDER — DIVALPROEX SODIUM 500 MG PO DR TAB
500.0000 mg | DELAYED_RELEASE_TABLET | Freq: Two times a day (BID) | ORAL | Status: DC
  Administered 2018-03-03 – 2018-03-12 (×19): 500 mg via ORAL
  Filled 2018-03-03 (×19): qty 1
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
## 2018-03-04 ENCOUNTER — Encounter (HOSPITAL_COMMUNITY): Payer: Self-pay | Admitting: *Deleted

## 2018-03-04 ENCOUNTER — Other Ambulatory Visit: Payer: Self-pay
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
## 2018-03-06 NOTE — Progress Notes (Signed)
03/06/2018 8:55 AM  [name] [id number]  Post-Op Day 12    Temp:  [98.2 F (36.8 C)-98.4 F (36.9 C)] 98.2 F (36.8 C) (01/14 0403) Pulse Rate:  [99-109] 99 (01/14 0403) Resp:  [21] 21 (01/13 2313) BP: (107-121)/(59-82) 107/59 (01/14 0403) SpO2:  [98 %-100 %] 100 % (01/14 0403),     Intake/Output Summary (Last 24 hours) at 03/06/2018 0855 Last data filed at 03/06/2018 0500 Gross per 24 hour  Intake 360 ml  Output -  Net 360 ml    No results found for this or any previous visit (from the past 24 hour(s)).  SUBJECTIVE:  No history  OBJECTIVE:  Moderate "cauliflower" ear deformity LEFT.  IMPRESSION:  Satisfactory check  PLAN:  Sutures out.  Chronic changes will likely not improve much more.  Recheck with me as needed.  Ileene Hutchinson [facility]
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
## 2018-03-07 NOTE — Clinical Social Work Note (Signed)
Clinical Social Worker continuing to follow patient and family for support and discharge planning needs.  CSW met at bedside with patient, patient sister/guardian [name]), patient caregiver Arboriculturist) and Therapist, nutritional Elta Guadeloupe).  Patient support system is diligently trying to make arrangements for patient discharge plan.    Currently, patient AFL is willing to have patient return following a visit with his primary Psychiatrist (Dr. Tamera Punt in [place]).  The home is being outfitted with an alarm system for doors and windows this week as well as hiring an additional caregiver for the home.  [employer] care coordinator, Elta Guadeloupe, states that he feels that patient will better transition back into the home if he is first able to meet with his Psychiatrist.    CSW contacted Dr. Tamera Punt [phone]) who is out of the office until Monday.  CSW has provided update to Care Coordinator who is working with caregivers and APS to determine plans.  Unfortunately, Monday is considered a holiday for [facility] and APS, therefore we are pushing for decision making ASAP and all entities are aware.  CSW remains available for support and will follow up with supervisor if continued barriers remain in place.  Barbette Or, [place]

## 2018-03-08 ENCOUNTER — Encounter (HOSPITAL_COMMUNITY): Payer: Self-pay | Admitting: General Practice
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
## 2018-03-08 NOTE — Plan of Care (Signed)
  Problem: Nutrition: Goal: Adequate nutrition will be maintained Outcome: Progressing   Problem: Coping: Goal: Level of anxiety will decrease Outcome: Progressing   Problem: Elimination: Goal: Will not experience complications related to bowel motility Outcome: Progressing Goal: Will not experience complications related to urinary retention Outcome: Progressing   

## 2018-03-09 NOTE — Progress Notes (Signed)
Noted slight redness and clear drainage to left thigh wound. Noted sutures midline testicles are slighty  pulled and sutures are visible. Will report to oncoming nurse to have medical team assess on rounds.  Denver Faster, BSN

## 2018-03-09 NOTE — Clinical Social Work Note (Signed)
Clinical Social Worker continuing to follow patient and family for support and discharge planning needs.  CSW spoke with [facility]) who states that patient has a scheduled appointment with community Psychiatrist at 10:40am on Tuesday March 13, 2018.  Per Angelyn Punt is hopeful for approval for continued caregiver support in the AFL by Tuesday.  [facility] Constable is closed on Monday (holiday) and will be available Tuesday morning to assist in facilitating discharge.  Patient caregiver Bailey Mech) plans to provide transport.  CSW remains available for support and to assist with discharge planning needs.  Barbette Or, [place]

## 2018-03-09 NOTE — Progress Notes (Signed)
Complaint of left groin/thigh pain. Tylenol given for PRN. Noted slight swelling to area, no redness or drainage noted. Will continue to monitor.  Denver Faster, BSN
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
## 2018-03-12 NOTE — Clinical Social Work Note (Signed)
Clinical Social Worker continuing to follow patient and family for support and discharge planning needs.  CSW spoke with Elta Guadeloupe [facility]) on Friday who states that patient has a scheduled appointment with community Psychiatrist at 10:40am on Tuesday March 13, 2018.  Per Angelyn Punt is hopeful for approval for continued caregiver support in the [facility] by Tuesday.  [facility] Con[facility] is closed today (holiday), therefore unable to confirm discharge plans, but will be available Tuesday morning to assist in facilitating discharge.  Patient caregiver Bailey Mech) plans to provide transport.  CSW remains available for support and to assist with discharge planning needs.  Barbette Or, [place]
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
## 2018-03-13 ENCOUNTER — Encounter: Payer: Self-pay | Admitting: Gastroenterology

## 2018-03-13 MED ORDER — CLOPIDOGREL BISULFATE 75 MG PO TABS: 75.0000 mg | ORAL_TABLET | Freq: Every day | ORAL | 0 refills | Status: DC

## 2018-03-13 MED ORDER — DIVALPROEX SODIUM 500 MG PO DR TAB: 500.0000 mg | DELAYED_RELEASE_TABLET | Freq: Two times a day (BID) | ORAL | 0 refills | Status: DC

## 2018-03-13 MED ORDER — RESOURCE THICKENUP CLEAR PO POWD: 1.0000 | ORAL | 1 refills | Status: DC | PRN
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
## 2018-03-13 NOTE — Progress Notes (Signed)
0840 Patient left for doctor appointment and group home with caretaker, [facility]. Rx's sent electronically to [facility] in [place] instead of via transitional care pharmacy due to time constraints. Had to be in [place] by 0930 for psychiatric appointment. Pt conditon unchanged, stable, home care/AVS summary reviewed with Robin. Escorted via wheelchair to car by NT Beverlee Nims. Simmie Davies RN

## 2018-04-05 ENCOUNTER — Emergency Department
Admission: EM | Admit: 2018-04-05 | Discharge: 2018-04-05 | Discharge status: Against Medical Advice | Payer: Medicaid Other

## 2018-04-05 DIAGNOSIS — F71 Moderate intellectual disabilities: Secondary | ICD-10-CM

## 2018-04-05 DIAGNOSIS — F250 Schizoaffective disorder, bipolar type: Secondary | ICD-10-CM | POA: Diagnosis present

## 2018-04-06 ENCOUNTER — Encounter (HOSPITAL_COMMUNITY): Payer: Medicaid Other

## 2018-04-06 ENCOUNTER — Emergency Department
Admission: EM | Admit: 2018-04-06 | Discharge: 2018-04-06 | Disposition: A | Discharge status: Home / Self Care | Payer: Medicaid Other | Attending: Emergency Medicine | Admitting: Emergency Medicine

## 2018-04-06 ENCOUNTER — Other Ambulatory Visit: Payer: Self-pay

## 2018-04-06 ENCOUNTER — Encounter: Payer: Medicaid Other | Admitting: Vascular Surgery

## 2018-04-06 DIAGNOSIS — Z87891 Personal history of nicotine dependence: Secondary | ICD-10-CM | POA: Diagnosis not present

## 2018-04-06 DIAGNOSIS — I10 Essential (primary) hypertension: Secondary | ICD-10-CM | POA: Diagnosis not present

## 2018-04-06 DIAGNOSIS — R4689 Other symptoms and signs involving appearance and behavior: Secondary | ICD-10-CM

## 2018-04-06 DIAGNOSIS — Z79899 Other long term (current) drug therapy: Secondary | ICD-10-CM | POA: Insufficient documentation

## 2018-04-06 DIAGNOSIS — F71 Moderate intellectual disabilities: Secondary | ICD-10-CM | POA: Diagnosis not present

## 2018-04-06 DIAGNOSIS — R451 Restlessness and agitation: Secondary | ICD-10-CM | POA: Insufficient documentation

## 2018-04-06 DIAGNOSIS — Z7902 Long term (current) use of antithrombotics/antiplatelets: Secondary | ICD-10-CM | POA: Diagnosis not present

## 2018-04-06 LAB — COMPREHENSIVE METABOLIC PANEL
ALT: 10 U/L (ref 0–44)
AST: 21 U/L (ref 15–41)
Albumin: 4.2 g/dL (ref 3.5–5.0)
Alkaline Phosphatase: 68 U/L (ref 38–126)
Anion gap: 6 (ref 5–15)
BUN: 21 mg/dL — ABNORMAL HIGH (ref 6–20)
CALCIUM: 9.7 mg/dL (ref 8.9–10.3)
CO2: 29 mmol/L (ref 22–32)
Chloride: 104 mmol/L (ref 98–111)
Creatinine, Ser: 0.95 mg/dL (ref 0.61–1.24)
GFR calc Af Amer: >60 mL/min (ref >60)
GFR calc non Af Amer: >60 mL/min (ref >60)
Glucose, Bld: 160 mg/dL — ABNORMAL HIGH (ref 70–99)
Potassium: 4.1 mmol/L (ref 3.5–5.1)
Sodium: 139 mmol/L (ref 135–145)
Total Bilirubin: 0.6 mg/dL (ref 0.3–1.2)
Total Protein: 7.7 g/dL (ref 6.5–8.1)

## 2018-04-06 LAB — URINE DRUG SCREEN, QUALITATIVE (ARMC ONLY)
Amphetamines, Ur Screen: NOT DETECTED
Barbiturates, Ur Screen: NOT DETECTED
Benzodiazepine, Ur Scrn: POSITIVE — AB
Cannabinoid 50 Ng, Ur ~~LOC~~: NOT DETECTED
Cocaine Metabolite,Ur ~~LOC~~: NOT DETECTED
MDMA (Ecstasy)Ur Screen: NOT DETECTED
Methadone Scn, Ur: NOT DETECTED
Opiate, Ur Screen: NOT DETECTED
Phencyclidine (PCP) Ur S: NOT DETECTED
TRICYCLIC, UR SCREEN: NOT DETECTED

## 2018-04-06 LAB — CBC
HCT: 43.5 % (ref 39.0–52.0)
Hemoglobin: 13.9 g/dL (ref 13.0–17.0)
MCH: 29.3 pg (ref 26.0–34.0)
MCHC: 32 g/dL (ref 30.0–36.0)
MCV: 91.8 fL (ref 80.0–100.0)
Platelets: 156 10*3/uL (ref 150–400)
RBC: 4.74 MIL/uL (ref 4.22–5.81)
RDW: 13.8 % (ref 11.5–15.5)
WBC: 5.8 10*3/uL (ref 4.0–10.5)
nRBC: 0 % (ref 0.0–0.2)

## 2018-04-06 LAB — SALICYLATE LEVEL: Salicylate Lvl: <7 mg/dL (ref 2.8–30.0)

## 2018-04-06 LAB — ACETAMINOPHEN LEVEL

## 2018-04-06 LAB — ETHANOL: Alcohol, Ethyl (B): <10 mg/dL (ref <10)

## 2018-04-06 NOTE — ED Notes (Signed)
Patient's caregiver voiced understanding of discharge instructions, Patient's belongings given back to Patient, Nurse talked with caregiver that came to transport back to group home and nurse had talked to legal guardian per phone call that Patient would be discharged.

## 2018-04-06 NOTE — ED Notes (Signed)
Patient keeps coming to the door asking for snacks, nurse had already given him graham crackers and a extra tray, He has child-like behaviors, and does not have concept of personal space, Patient does not comprehend limitations, nurse will continue to monitor and redirect him as necessary, Patient is safe.

## 2018-04-06 NOTE — Consult Note (Signed)
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 male patient admitted with "graham crackers and peanut butter".  HPI: Patient seen chart reviewed.  Patient known for many previous encounters.  58 year old man with significant intellectual disability and schizoaffective disorder long history of behavior problems.  Brought here from his group home with reports that he was medicine noncompliant and had been agitated and "suicidal".  On interview today the patient did use the word suicidal.  He was not able to tell me what it meant.  He did not indicate any desire to die.  He repeatedly made it clear that his main desire was to be given graham crackers and peanut butter.  Patient has been a little intrusive but not threatening or agitated or violent.  No sign of self injury.  Compliant with medicine.  Social history: Patient has family who are supportive of him.  Currently living in a group home.  Medical history: Past history of falls which appear to ultimately to be mostly behaviorally related.  High blood pressure.  Substance abuse history: None  Past Psychiatric History: Longstanding problem with schizoaffective disorder and intellectual disability.  Patient has been on clozapine for years which has been very effective when he takes it.  Usually well-tolerated.  Even at his best he is very limited in his conversation and functioning.  No known history of actual suicide attempts.   He will at times use the word and what seems to be mostly an attempt to get his way.  Risk to Self:   Risk to Others:   Prior Inpatient Therapy:   Prior Outpatient Therapy:    Past Medical History:  Past Medical History:  Diagnosis Date  . Hypertension   . Schizo-affective schizophrenia [facility])     Past Surgical History:  Procedure Laterality Date  . COLONOSCOPY WITH PROPOFOL N/A 12/28/2017   Procedure: COLONOSCOPY WITH PROPOFOL;  Surgeon: Jonathon Bellows, MD;  Location: [facility] ENDOSCOPY;  Service: Gastroenterology;  Laterality: N/A;  . HYDROCELE EXCISION Bilateral 02/22/2018   Procedure: bilateral HYDROCELECTOMY ADULT;  Surgeon: Cleon Gustin, MD;  Location: [facility];  Service: Urology;  Laterality: Bilateral;  . INSERTION OF ILIAC STENT Left 02/22/2018   Procedure: INSERTION LEFT SUPERIOR FEMORAL ARTERY USING 6MM X 5CM VIABHON STENT with mynx device closure on right femoral artery;  Surgeon: Waynetta Sandy, MD;  Location: [facility];  Service: Vascular;  Laterality: Left;  . KNEE ARTHROSCOPY    . SCROTAL EXPLORATION N/A 02/22/2018   Procedure: SCROTUM EXPLORATION;  Surgeon: Cleon Gustin, MD;  Location: [facility];  Service: Urology;  Laterality: N/A;  . UPPER EXTREMITY ANGIOGRAM Left 02/22/2018   Procedure: left lower EXTREMITY ANGIOGRAM;  Surgeon: Waynetta Sandy, MD;  Location: [facility] OR;  Service: Vascular;  Laterality: Left;   Family History:  Family History  Problem Relation Age of Onset  . Diabetes Mother    Family Psychiatric  History: Unknown Social History:  Social History   Substance and Sexual Activity  Alcohol Use No     Social History   Substance and  Sexual Activity  Drug Use No    Social History   Socioeconomic History  . Marital status: Single    Spouse name: Not on file  . Number of children: Not on file  . Years of education: Not on file  . Highest education level: Not on file  Occupational History  . Not on file  Social Needs  . Financial  resource strain: Not on file  . Food insecurity:    Worry: Not on file    Inability: Not on file  . Transportation needs:    Medical: Not on file    Non-medical: Not on file  Tobacco Use  . Smoking status: Former Smoker  Substance and Sexual Activity  . Alcohol use: No  . Drug use: No  . Sexual activity: Never  Lifestyle  . Physical activity:    Days per week: Not on file    Minutes per session: Not on file  . Stress: Not on file  Relationships  . Social connections:    Talks on phone: Not on file    Gets together: Not on file    Attends religious service: Not on file    Active member of club or organization: Not on file    Attends meetings of clubs or organizations: Not on file    Relationship status: Not on file  Other Topics Concern  . Not on file  Social History Narrative   ** Merged History Encounter **       Additional Social History:    Allergies:  No Known Allergies  Labs: No results found for this or any previous visit (from the past 48 hour(s)).  No current facility-administered medications for this encounter.    Current Outpatient Medications  Medication Sig Dispense Refill  . Cholecalciferol (VITAMIN D-3) 5000 units TABS Take 5,000 Units by mouth daily.     . clopidogrel (PLAVIX) 75 MG tablet Take 1 tablet (75 mg total) by mouth daily. 30 tablet 0  . clozapine (CLOZARIL) 100 MG tablet Take 1 tablet (100 mg total) by mouth at bedtime. 30 tablet 0  . clozapine (CLOZARIL) 50 MG tablet Take 50-100 mg by mouth See admin instructions. 50mg  in the morning 100mg  at bedtime    . divalproex (DEPAKOTE) 500 MG DR tablet Take 2 tablets (1,000 mg total) by mouth at bedtime. 60 tablet 0  . divalproex (DEPAKOTE) 500 MG DR tablet Take 1 tablet (500 mg total) by mouth 2 (two) times daily. 30 tablet 0  . ibuprofen (ADVIL,MOTRIN) 600 MG tablet Take 1 tablet (600 mg total) by mouth every 8 (eight) hours as needed. (Patient not taking: Reported on 02/15/2018) 30 tablet 0  .  INGREZZA 80 MG CAPS Take 80 mg by mouth daily.    . Maltodextrin-Xanthan Gum (RESOURCE THICKENUP CLEAR) POWD Take 120 g by mouth as needed. 1 Can 1  . traZODone (DESYREL) 100 MG tablet Take 100 mg by mouth at bedtime.    . vitamin E 200 UNIT capsule Take 200 Units by mouth daily.    Marland Kitchen zolpidem (AMBIEN) 10 MG tablet Take 10 mg by mouth at bedtime.      Musculoskeletal: Strength & Muscle Tone: within normal limits Gait & Station: normal Patient leans: N/A  Psychiatric Specialty Exam: Physical Exam  Nursing note and vitals reviewed. Constitutional: He appears well-developed and well-nourished.  HENT:  Head: Normocephalic and atraumatic.  Eyes: Pupils are equal, round, and reactive to light. Conjunctivae are normal.  Neck: Normal range of motion.  Cardiovascular: Regular rhythm and normal heart sounds.  Respiratory: Effort normal. No respiratory distress.  GI: Soft.  Musculoskeletal: Normal range of motion.  Neurological: He is alert.  Skin: Skin is warm and dry.  Psychiatric: His affect is blunt. His speech is tangential. He is not agitated and not aggressive. Cognition and memory are impaired. He expresses impulsivity and inappropriate judgment. He expresses no homicidal and no suicidal ideation. He exhibits abnormal recent memory and abnormal remote memory.    Review of Systems  Unable to perform ROS: Dementia    There were no vitals taken for this visit.There is no height or weight on file to calculate BMI.  General Appearance: Casual  Eye Contact:  Fair  Speech:  Slurred  Volume:  Decreased  Mood:  Euthymic  Affect:  Constricted  Thought Process:  Disorganized  Orientation:  Negative  Thought Content:  Illogical, Rumination and Tangential  Suicidal Thoughts:  No  Homicidal Thoughts:  No  Memory:  Immediate;   Fair Recent;   Poor Remote;   Poor  Judgement:  Impaired  Insight:  Lacking  Psychomotor Activity:  Normal  Concentration:  Concentration: Poor  Recall:  Poor   Fund of Knowledge:  Poor  Language:  Poor  Akathisia:  Negative  Handed:  Right  AIMS (if indicated):     Assets:  Financial Resources/Insurance Housing Social Support  ADL's:  Impaired  Cognition:  Impaired,  Moderate  Sleep:        Treatment Plan Summary: Plan Patient well-known from previous encounters.  Appears to be at his baseline.  I do not think that he is really an elevated risk of suicide.  No indication that he is actually agitated or psychotic any different than usual.  No indication that he actually wants to harm himself.  Patient has a history of using manipulative behaviors to get what he wants or just to get an interaction with caregivers.  Right now seems to be stable at his baseline with no indication for hospitalization.  No IVC.  Case reviewed with emergency room doctor.  No change to his medicine recommend he be discharged back to his group home.  Disposition: No evidence of imminent risk to self or others at present.   Patient does not meet criteria for psychiatric inpatient admission.  Alethia Berthold, MD 04/06/2018 3:51 PM

## 2018-04-06 NOTE — ED Notes (Addendum)
Pt has been dressed out by EDT with RN present. Pt has black tennis shoes, white socks, black sweatshirt, 1 pair of jeans and a black belt, black coat, black hat, grey underwear, white washcloth and a white t-shirt. Pt cooperative with ED staff.

## 2018-04-06 NOTE — ED Notes (Signed)
More crackers with PB and a sprite provided

## 2018-04-06 NOTE — ED Notes (Signed)
Lunch tray from the kitchen provided  - pt ate it all very quickly and he is already asking for another sandwich tray
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
## 2018-04-06 NOTE — ED Notes (Signed)
Crackers with PB provided along with a drink

## 2018-04-06 NOTE — BH Assessment (Signed)
TTS called pt's group home staff member ([name]: [phone]) who confirmed he will pick-up patient at discharge. He reports he's route and should arrive with in 15-30mins.

## 2018-04-06 NOTE — ED Notes (Signed)
[place] sandwich tray provided along with a water

## 2018-04-06 NOTE — ED Notes (Signed)
BEHAVIORAL HEALTH ROUNDING Patient sleeping: No. Patient alert and oriented: yes Behavior appropriate: Yes.  ; If no, describe:  Nutrition and fluids offered: yes Toileting and hygiene offered: Yes  Sitter present: q15 minute observations and security  monitoring Law enforcement present: Yes  ODS  

## 2018-04-06 NOTE — ED Notes (Signed)

## 2018-04-06 NOTE — Discharge Instructions (Addendum)
Austin Gates has been evaluated by an ER physician and a psychiatrist.  His medical work-up is unrevealing.  The psychiatrist has cleared him for discharge with continued outpatient treatment.  He should return to the ER for new or worsening behavior problems, suicidal ideation, or any other new or worsening symptoms that are concerning to facility staff.

## 2018-04-06 NOTE — ED Triage Notes (Addendum)
Pt arrived with staff from [facility] with behavioral issues. Pt has not been taking his medication. Pt has been aggressive with staff and guardian wants him to be evaluated. Pt has also been telling staff that he wants to kill himself and go to jail. [place] Is the address for the [facility] that he resides.

## 2018-04-07 ENCOUNTER — Other Ambulatory Visit: Payer: Self-pay

## 2018-04-07 ENCOUNTER — Encounter (HOSPITAL_COMMUNITY): Payer: Self-pay | Admitting: *Deleted

## 2018-04-07 ENCOUNTER — Emergency Department (HOSPITAL_COMMUNITY): Payer: Medicaid Other

## 2018-04-07 ENCOUNTER — Emergency Department (HOSPITAL_COMMUNITY)
Admission: EM | Admit: 2018-04-07 | Discharge: 2018-05-08 | Disposition: A | Discharge status: Home / Self Care | Payer: Medicaid Other | Attending: Emergency Medicine | Admitting: Emergency Medicine

## 2018-04-07 DIAGNOSIS — R000 Tachycardia, unspecified: Secondary | ICD-10-CM | POA: Diagnosis not present

## 2018-04-07 DIAGNOSIS — Z87891 Personal history of nicotine dependence: Secondary | ICD-10-CM | POA: Diagnosis not present

## 2018-04-07 DIAGNOSIS — F259 Schizoaffective disorder, unspecified: Secondary | ICD-10-CM | POA: Diagnosis not present

## 2018-04-07 DIAGNOSIS — I10 Essential (primary) hypertension: Secondary | ICD-10-CM | POA: Diagnosis not present

## 2018-04-07 DIAGNOSIS — Y999 Unspecified external cause status: Secondary | ICD-10-CM | POA: Insufficient documentation

## 2018-04-07 DIAGNOSIS — Z79899 Other long term (current) drug therapy: Secondary | ICD-10-CM | POA: Diagnosis not present

## 2018-04-07 DIAGNOSIS — S0990XA Unspecified injury of head, initial encounter: Secondary | ICD-10-CM | POA: Diagnosis present

## 2018-04-07 DIAGNOSIS — Y929 Unspecified place or not applicable: Secondary | ICD-10-CM | POA: Diagnosis not present

## 2018-04-07 DIAGNOSIS — S129XXA Fracture of neck, unspecified, initial encounter: Secondary | ICD-10-CM | POA: Diagnosis not present

## 2018-04-07 DIAGNOSIS — W1789XA Other fall from one level to another, initial encounter: Secondary | ICD-10-CM | POA: Diagnosis not present

## 2018-04-07 DIAGNOSIS — Z7901 Long term (current) use of anticoagulants: Secondary | ICD-10-CM | POA: Insufficient documentation

## 2018-04-07 DIAGNOSIS — Y9389 Activity, other specified: Secondary | ICD-10-CM | POA: Insufficient documentation

## 2018-04-07 DIAGNOSIS — S0292XD Unspecified fracture of facial bones, subsequent encounter for fracture with routine healing: Secondary | ICD-10-CM

## 2018-04-07 DIAGNOSIS — Y939 Activity, unspecified: Secondary | ICD-10-CM | POA: Insufficient documentation

## 2018-04-07 DIAGNOSIS — R4689 Other symptoms and signs involving appearance and behavior: Secondary | ICD-10-CM

## 2018-04-07 HISTORY — DX: Unspecified intellectual disabilities: F79

## 2018-04-07 HISTORY — DX: Obsessive-compulsive disorder, unspecified: F42.9

## 2018-04-07 LAB — COMPREHENSIVE METABOLIC PANEL
ALT: 10 U/L (ref 0–44)
AST: 25 U/L (ref 15–41)
Albumin: 4.2 g/dL (ref 3.5–5.0)
Alkaline Phosphatase: 72 U/L (ref 38–126)
Anion gap: 8 (ref 5–15)
BUN: 20 mg/dL (ref 6–20)
CO2: 25 mmol/L (ref 22–32)
Calcium: 9.7 mg/dL (ref 8.9–10.3)
Chloride: 105 mmol/L (ref 98–111)
Creatinine, Ser: 1.12 mg/dL (ref 0.61–1.24)
GFR calc Af Amer: >60 mL/min (ref >60)
GFR calc non Af Amer: >60 mL/min (ref >60)
Glucose, Bld: 124 mg/dL — ABNORMAL HIGH (ref 70–99)
Potassium: 4.3 mmol/L (ref 3.5–5.1)
Sodium: 138 mmol/L (ref 135–145)
Total Bilirubin: 0.7 mg/dL (ref 0.3–1.2)
Total Protein: 8.2 g/dL — ABNORMAL HIGH (ref 6.5–8.1)

## 2018-04-07 LAB — CBC
HCT: 44.9 % (ref 39.0–52.0)
Hemoglobin: 13.8 g/dL (ref 13.0–17.0)
MCH: 28.9 pg (ref 26.0–34.0)
MCHC: 30.7 g/dL (ref 30.0–36.0)
MCV: 94.1 fL (ref 80.0–100.0)
Platelets: 173 10*3/uL (ref 150–400)
RBC: 4.77 MIL/uL (ref 4.22–5.81)
RDW: 13.8 % (ref 11.5–15.5)
WBC: 6.9 10*3/uL (ref 4.0–10.5)
nRBC: 0 % (ref 0.0–0.2)

## 2018-04-07 LAB — ETHANOL: Alcohol, Ethyl (B): <10 mg/dL (ref <10)

## 2018-04-07 LAB — RAPID URINE DRUG SCREEN, HOSP PERFORMED
AMPHETAMINES: NOT DETECTED
Barbiturates: NOT DETECTED
Benzodiazepines: POSITIVE — AB
Cocaine: NOT DETECTED
Opiates: NOT DETECTED
TETRAHYDROCANNABINOL: NOT DETECTED

## 2018-04-07 LAB — VALPROIC ACID LEVEL: Valproic Acid Lvl: 47 ug/mL — ABNORMAL LOW (ref 50.0–100.0)

## 2018-04-07 LAB — ACETAMINOPHEN LEVEL

## 2018-04-07 LAB — SALICYLATE LEVEL: Salicylate Lvl: <7 mg/dL (ref 2.8–30.0)

## 2018-04-07 MED ORDER — CLOZAPINE 100 MG PO TABS
100.0000 mg | ORAL_TABLET | Freq: Every day | ORAL | Status: DC
  Administered 2018-04-07 – 2018-04-12 (×6): 100 mg via ORAL
  Filled 2018-04-07 (×6): qty 1

## 2018-04-07 MED ORDER — HALOPERIDOL LACTATE 5 MG/ML IJ SOLN
5.0000 mg | Freq: Four times a day (QID) | INTRAMUSCULAR | Status: DC | PRN
  Administered 2018-04-08 – 2018-04-19 (×9): 5 mg via INTRAMUSCULAR
  Filled 2018-04-07 (×10): qty 1

## 2018-04-07 MED ORDER — TRAZODONE HCL 100 MG PO TABS
100.0000 mg | ORAL_TABLET | Freq: Every day | ORAL | Status: DC
  Administered 2018-04-07 – 2018-05-07 (×31): 100 mg via ORAL
  Filled 2018-04-07 (×31): qty 1

## 2018-04-07 MED ORDER — LORAZEPAM 1 MG PO TABS
2.0000 mg | ORAL_TABLET | Freq: Four times a day (QID) | ORAL | Status: DC | PRN
  Administered 2018-04-07 – 2018-05-08 (×55): 2 mg via ORAL
  Filled 2018-04-07 (×58): qty 2

## 2018-04-07 MED ORDER — HALOPERIDOL 5 MG PO TABS
5.0000 mg | ORAL_TABLET | Freq: Four times a day (QID) | ORAL | Status: DC | PRN
  Administered 2018-04-07 – 2018-05-08 (×48): 5 mg via ORAL
  Filled 2018-04-07 (×53): qty 1

## 2018-04-07 MED ORDER — DIPHENHYDRAMINE HCL 50 MG/ML IJ SOLN
50.0000 mg | Freq: Four times a day (QID) | INTRAMUSCULAR | Status: DC | PRN
  Administered 2018-04-08 – 2018-04-17 (×6): 50 mg via INTRAMUSCULAR
  Filled 2018-04-07 (×6): qty 1

## 2018-04-07 MED ORDER — CLOZAPINE 100 MG PO TABS: 50.0000 mg | ORAL_TABLET | ORAL | Status: DC

## 2018-04-07 MED ORDER — CLOPIDOGREL BISULFATE 75 MG PO TABS
75.0000 mg | ORAL_TABLET | Freq: Every day | ORAL | Status: DC
  Administered 2018-04-08 – 2018-05-08 (×32): 75 mg via ORAL
  Filled 2018-04-07 (×32): qty 1

## 2018-04-07 MED ORDER — ZOLPIDEM TARTRATE 5 MG PO TABS: 10.0000 mg | ORAL_TABLET | Freq: Every day | ORAL | Status: DC

## 2018-04-07 MED ORDER — LORAZEPAM 2 MG/ML IJ SOLN
2.0000 mg | Freq: Four times a day (QID) | INTRAMUSCULAR | Status: DC | PRN
  Administered 2018-04-08 – 2018-04-17 (×8): 2 mg via INTRAMUSCULAR
  Filled 2018-04-07 (×7): qty 1

## 2018-04-07 MED ORDER — ZIPRASIDONE HCL 20 MG PO CAPS
20.0000 mg | ORAL_CAPSULE | Freq: Once | ORAL | Status: AC
  Administered 2018-04-07: 20 mg via ORAL
  Filled 2018-04-07: qty 1

## 2018-04-07 MED ORDER — CLOZAPINE 25 MG PO TABS
50.0000 mg | ORAL_TABLET | Freq: Every morning | ORAL | Status: DC
  Administered 2018-04-08 – 2018-04-13 (×5): 50 mg via ORAL
  Filled 2018-04-07 (×4): qty 2
  Filled 2018-04-07: qty 1
  Filled 2018-04-07 (×3): qty 2

## 2018-04-07 MED ORDER — DIVALPROEX SODIUM 250 MG PO DR TAB
500.0000 mg | DELAYED_RELEASE_TABLET | Freq: Two times a day (BID) | ORAL | Status: DC
  Administered 2018-04-07 – 2018-04-15 (×16): 500 mg via ORAL
  Filled 2018-04-07 (×16): qty 2

## 2018-04-07 MED ORDER — VALBENAZINE TOSYLATE 80 MG PO CAPS
80.0000 mg | ORAL_CAPSULE | Freq: Every day | ORAL | Status: DC
  Administered 2018-04-09 – 2018-05-08 (×25): 80 mg via ORAL
  Filled 2018-04-07 (×30): qty 1

## 2018-04-07 MED ORDER — DIPHENHYDRAMINE HCL 25 MG PO CAPS
50.0000 mg | ORAL_CAPSULE | Freq: Four times a day (QID) | ORAL | Status: DC | PRN
  Administered 2018-04-07 – 2018-05-06 (×55): 50 mg via ORAL
  Filled 2018-04-07 (×57): qty 2

## 2018-04-07 NOTE — ED Notes (Signed)
Copy of IVC papers faxed to [facility], copy sent to Medical Records, original placed in folder for Magistrate, and all 3 sets on clipboard.  

## 2018-04-07 NOTE — ED Notes (Addendum)
Pt noted to be yelling, cursing, calling staff "Y'all are some white fucking bitches!" Pt throwing bedside table around in room. Security and [employer] arrived. Table removed from room by Security. Pt refusing to stay in room and refusing to lower his voice. Pt took meds given. Pt given snacks as requested.

## 2018-04-07 NOTE — ED Notes (Signed)
Shirlean Mylar, Caregiver - [phone] - called and advised pt had AM meds including Clozaril. Advised she will bring Valbenazine to ED. Also advised pt will continuously ask for for food and that he was hospitalized at [facility] for 3 weeks last month and was given ice cream, pudding, graham crackers, and peanut butter so pt wanted to come back to [facility]. States she was in ED earlier and pt became agitated so she left. Advised she will talk w/Pharm Tech to confirm pt's meds.

## 2018-04-07 NOTE — ED Notes (Signed)
[employer] and other [employer] Officers w/pt - Security also w/pt.

## 2018-04-07 NOTE — ED Notes (Signed)
Loyce Dys Tech, advised aware Clozaril order needs to be corrected.
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
## 2018-04-07 NOTE — ED Notes (Signed)
Pt attempted to call Lauris Poag and his sister, [name] - no answer. Pt returned to room. Pt has been ambulating to nurses' desk continuously.

## 2018-04-07 NOTE — ED Notes (Signed)
Pt ambulatory to Rm 50 - wearing burgundy scrubs. Pt noted w/mumbled speech. Deputy w/pt - asking if he can leave. Requested for him to stay for few minutes to ensure pt remains calm.

## 2018-04-07 NOTE — ED Notes (Signed)
Pt having intermittent outbursts of yelling and cursing

## 2018-04-07 NOTE — ED Notes (Signed)
Pt given food and beverage; pt calm at this time

## 2018-04-07 NOTE — Progress Notes (Signed)
Patient meets criteria for inpatient treatment. No appropriate or available beds at [facility]. CSW faxed referrals to the following facilities for review:  [facility], [facility], [facility], [facility], [facility], [facility], [facility], [facility], [facility], [facility], [place], [facility], [place] [facility], [facility], [place], [facility], and [place].  TTS will continue to seek bed placement.  Chalmers Guest. Guerry Bruin, MSW, [place] Work/Disposition Phone: [phone] Fax: [phone]

## 2018-04-07 NOTE — ED Notes (Signed)
Pt continuously asking for peanut butter and crackers.

## 2018-04-07 NOTE — ED Notes (Signed)
Pt continues to have outbursts of yelling and cursing

## 2018-04-07 NOTE — ED Notes (Signed)
IVC papers received from [facility] aware of need for IVC papers to be served.

## 2018-04-07 NOTE — ED Notes (Addendum)
Pharm Tech aware of pt's order for Clozaril needs to be corrected as will not display so may administer. Pharm Tech attempting to reach group home to request them to bring pt's home med - Pope and inquire pt's last dose administered.

## 2018-04-07 NOTE — ED Notes (Signed)
Pt speaking w/ TTS

## 2018-04-07 NOTE — ED Notes (Signed)
Patient denies pain and is resting comfortably.  
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
## 2018-04-07 NOTE — ED Notes (Signed)
Requested for Pharm to correct order for Clozaril so may be administered.

## 2018-04-07 NOTE — ED Notes (Signed)
Pt on phone w/Carolyn "[name]" Mare Ferrari - aware this is his 2nd and last phone call for the day. Pt noted to be cursing, yelling on phone.

## 2018-04-07 NOTE — ED Notes (Signed)
Pt ate dinner. Pt talking loudly - asking for peanut butter and crackers. Advised pt no peanut butter in ED. Pt noted to be yelling - repeating same statements.

## 2018-04-07 NOTE — ED Notes (Signed)
IVC paperwork in progress 

## 2018-04-07 NOTE — ED Notes (Signed)
Pt changed into purple scrubs and wanded by security  

## 2018-04-07 NOTE — ED Notes (Signed)
Pt lying on bed - cursing - stating "I'm going to kill all of y'all! I'm going to stab everyone of y'all! Fuck it!" Off-Duty [employer] has remained at bedside d/t pt's behavior for approx for 3-1/2 hours.

## 2018-04-07 NOTE — ED Notes (Signed)
Contacted patients group home to ensure they were aware of patient arrival. Per [name] with group home pt was sent here via the [facility] [place] [facility] due to aggressive behavior, pt was seen at [facility] for same yesterday and discharged back to facility. This morning the patient jumped out of his 1st floor window and landed on gravel at the group home, staff deny pt LOC, states he got up and was walking down the street, able to be brought back to group home by staff and [facility] was contacted. Per staff the group home director is taking out IVC paperwork and is on her way here. No IVC at this time. Pt is calm and cooperative.

## 2018-04-08 MED ORDER — DIPHENHYDRAMINE HCL 50 MG/ML IJ SOLN
25.0000 mg | Freq: Once | INTRAMUSCULAR | Status: AC
  Administered 2018-04-09: 25 mg via INTRAMUSCULAR
  Filled 2018-04-08 (×2): qty 1

## 2018-04-08 MED ORDER — LORAZEPAM 2 MG/ML IJ SOLN
2.0000 mg | Freq: Once | INTRAMUSCULAR | Status: DC
  Filled 2018-04-08: qty 1

## 2018-04-08 MED ORDER — HALOPERIDOL LACTATE 5 MG/ML IJ SOLN
10.0000 mg | Freq: Once | INTRAMUSCULAR | Status: DC
  Filled 2018-04-08: qty 2

## 2018-04-08 NOTE — ED Notes (Addendum)
Pt ate snacks given - repeatedly asking for food and the cursing. Pt noted to be talking loudly. Encouraged pt to lower voice.

## 2018-04-08 NOTE — ED Notes (Signed)
Pt ate breakfast - pt repeatedly stating he wants more food. Sitter and RN advising pt he may have snack at 1000. Pt noted to be talking loudly.

## 2018-04-08 NOTE — ED Notes (Signed)
Per staffing, sitter will be available at 2300.

## 2018-04-08 NOTE — BHH Counselor (Signed)
Pt denies SI/HI and AVH.  Pt states he hates his group home and he will not return.  TTS will continue to seek placement.  Lorenza Cambridge, [facility] Triage Specialist

## 2018-04-08 NOTE — ED Notes (Signed)
[name], Group Home, brought pt's home med - Ingrezza - sent to Pharmacy.

## 2018-04-08 NOTE — ED Notes (Signed)
Pt's Legal Guardian - Austin Gates - came to visit w/pt at 1245. Pt noted to be sleeping. She advised she will contact Robin to request for her to bring pt's Valbenazine med from group home d/t not available through our pharmacy. Pt now awake - ambulated to bathroom w/assistance and returned to room. Pt had urinary incont episode - new set of paper scrubs given.

## 2018-04-08 NOTE — ED Notes (Signed)
Pt had episode of urinary incontinence while walking to bathroom. Pants and socks changed. Pt assisted back to bed. Pt calm and cooperative at this time.

## 2018-04-08 NOTE — ED Notes (Signed)
Pt on phone talking w/Legal Guardian Mrs Mare Ferrari.

## 2018-04-08 NOTE — ED Notes (Signed)
Pt woke - asked Sitter about food - then returned to sleeping.

## 2018-04-08 NOTE — ED Notes (Signed)
Pt eating snack given and drinking Sprite. Pt voiced understanding he may have food again at dinner time.

## 2018-04-08 NOTE — ED Notes (Signed)
Unable to re-direct pt - pt yelling and cursing - repeatedly. Off-Duty [employer] Officer at bedside. Pt given injections - tolerated well.

## 2018-04-08 NOTE — ED Notes (Signed)
Pt lying on bed - noted to be yelling, cursing, calling staff names, and threatening "I'll kill your fucking ass!" Pt asking for more food - "I want fucking graham crackers and peanut butter!" Staff allowed pt to vent feeling/concerns and encouraged him to display appropriate behavior.

## 2018-04-08 NOTE — ED Notes (Signed)
Pt noted to be yelling, cursing, and calling Sitter names "You fucking black bitch!" and banging on his bed. Security and Off-Duty [employer] Officer assisting as requested. Pt tolerated injections given well.

## 2018-04-08 NOTE — ED Provider Notes (Signed)
Patient is awake, alert, awaiting placement.  Patient requesting multiple snacks, advised by staff he is unable to have snacks at snack time and mealtime.  No other needs at this time, vitals reviewed.   Tacy Learn, PA-C 04/08/18 [place], [place], DO 04/08/18 [phone]

## 2018-04-08 NOTE — ED Notes (Signed)
Austin Gates, sister, called to speak to pt. Family notified pt is sleeping and has made his two phone calls for the day. RN to notify day shift RN pt that sister has called.

## 2018-04-08 NOTE — ED Notes (Signed)
Pt continues to yell - "Fuck you! Suck my damn dick, you muther fucker!" "Son of a bitch!"

## 2018-04-08 NOTE — ED Notes (Signed)
Pt in shower.  

## 2018-04-08 NOTE — ED Notes (Signed)
Off-Duty [employer] Officer has arrived to sit w/pt.

## 2018-04-08 NOTE — ED Notes (Signed)
Pt called [name] "Austin Gates" Mare Ferrari from phone at nurses' desk - aware this is his 2nd and last phone call for the day. Pt noted to be talking loudly - able to be redirected somewhat at this time.

## 2018-04-08 NOTE — ED Notes (Signed)
Pt continuing to ask about snack time. Pt redirected and reminded snack time is at 2100. Pt cooperative at this time. RN assisted pt to bathroom and back.

## 2018-04-08 NOTE — ED Notes (Signed)
Pt cooperated w/EKG after much encouragement.

## 2018-04-08 NOTE — ED Notes (Signed)
Pt slept - now awake. Pt continues w/repetitive speech - asking for "nurse" and asking for food.

## 2018-04-08 NOTE — ED Notes (Signed)
Mrs Mare Ferrari advised RN Shirlean Mylar advised either she or Celesta Gentile will bring pt's home med.

## 2018-04-08 NOTE — Progress Notes (Signed)
CSW contacted referral facilities with the following results:  Still reviewing: [place]: [facility] (at capacity) [facility] (at capacity) [facility] (only have self-pay bed available) [facility] (at capacity) [facility] (due to exclusionary criteria) [facility] (at capacity) [place] (at capacity) [facility] (due to aggression) [facility] (at capacity) [facility] (due to aggression)  TTS will continue to seek placement.  Chalmers Guest. Guerry Bruin, MSW, [place] Work/Disposition Phone: [phone] Fax: [phone]

## 2018-04-08 NOTE — ED Notes (Signed)
Snack given at this time  

## 2018-04-09 NOTE — ED Notes (Signed)
Pt continues to ask RN for snacks; pt advised of next time he is able to get snack; Pt lying in bed calling sitter "[facility]* and hitting his bed-Monique,RN

## 2018-04-09 NOTE — Consult Note (Signed)
NP attempted to reassess via tele-assessment.. nursing staff reports patient's agitated.  Reports [employer] at bedside unable to assess at this time.  TTS to continue seeking inpatient admission.
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
## 2018-04-09 NOTE — ED Notes (Signed)
Breakfast Tray Ordered. 

## 2018-04-09 NOTE — ED Notes (Signed)
Pt requesting chair to sit to watch TV, found pt recliner

## 2018-04-09 NOTE — ED Notes (Signed)
[phone] [name] called for update, informed pt is calm and eating breakfast at this time, will call about 1200 to see if pt is awake for visiting hours

## 2018-04-09 NOTE — Progress Notes (Signed)
CSW contacted and asked to speak to patient's sister and legal guardian, Virgil Benedict, aka "[name]".  Gave sister a general update about looking for a treatment bed.  Sister asked if he "couldn't be closer to [facility]?"  [place] told her that I would try to find a place near [place] but could not guarantee that he woud be accepted. As he has not yet been picked up.  Also explained that as he has been in the ED for an extended period, we would need to accept almost any treatment bed that is offered.  Sister voiced understanding but asked if we "could really try to get him close."  [name] T. Judi Cong, MSW, [place] Disposition Clinical Social Work [phone] (cell) [phone] (office)

## 2018-04-09 NOTE — ED Notes (Signed)
Pt making phone call, sister has called in so this is his second phone call of the day. Voiced understanding of two call policy

## 2018-04-09 NOTE — ED Notes (Signed)
Pt's sister called in, other sister planning to visit at 31

## 2018-04-09 NOTE — ED Notes (Signed)
Pt tolerated IM injection, took willingly

## 2018-04-09 NOTE — ED Notes (Addendum)
Pt insisting to [employer] that he is allowed to have double portions. Staff has informed him throughout the day that there are no double portions. Pt states he wants something to drink offered water, pt refusing water unless he also has cookies. Informed it is not snack time. Swearing, continues to demand something to eat and something other than water to drink.

## 2018-04-09 NOTE — ED Notes (Signed)
Pt continually calling out for food and drink, given 1000 snack, informed of what time lunch is

## 2018-04-09 NOTE — ED Notes (Signed)
Pt is saying nasty remarks to tech and slamming table against wall. Took table out and asked pt is he needed something to help him relax and sleep. He said yes. Was given cracker sand drink also to calm him down. Taken medication by mouth.
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
## 2018-04-09 NOTE — Progress Notes (Signed)
Pt. meets criteria for inpatient treatment per Austin Ala, NP.  No appropriate beds available at [facility]. Referred out to the following hospitals:  [facility]  [facility]  [facility]  [facility]    [facility]   Disposition CSW will continue to follow for placement.  Austin Gates. Judi Cong, MSW, [place] Disposition Clinical Social Work [phone] (cell) [phone] (office)'

## 2018-04-10 NOTE — Consult Note (Addendum)
Telepsych Consultation   Reason for Consult:  Aggressive behavior  Referring Physician:  EPD Location of Patient:  Location of Provider: [facility]  Patient Identification: Austin Gates MRN:  [id number] Principal Diagnosis: <principal problem not specified> Diagnosis:  Active Problems:   * No active hospital problems. *   Total Time spent with patient: 15 minutes  Subjective:   Austin Gates is a 58 y.o. male Patient seen via tele-assessment.  He is awake alert and oriented.  Continues to report he does not want to go back to his current group home.   Patient is very adamant with not wanting to returning back to current group home. Chart review patient's medication compliant since his admission to emergency department.  Patient presents irritable and cantankouse  throughout this assessment.  Was reported by nursing staff this is patient's baseline. Staff reports patient has been redirectable. Chart reviewed: history of schizoaffective with aggressive behaviors.   Per social work staff patient has been accepted back to current living facility.  As reported by social worker patient has been residing there for many years.  See CSW note.  Case staffed with MD Dwyane Dee.  No acute safety concerns.  Patient is cleared psychiatrically.  Pending disposition per Education officer, museum and group home discharge. Patient to keep follow-up appointment with current psychiatrist.   NP contacted EDP [facility] discharge disposition recommendation.  HPI:  Per assessment note on admission: Patient seen chart reviewed.  Patient known for many previous encounters.  58 year old man with significant intellectual disability and schizoaffective disorder long history of behavior problems.  Brought here from his group home with reports that he was medicine noncompliant and had been agitated and "suicidal".  On interview today the patient did use the word suicidal.  He was not able to tell me what it meant.  He  did not indicate any desire to die.  He repeatedly made it clear that his main desire was to be given graham crackers and peanut butter.  Patient has been a little intrusive but not threatening or agitated or violent.  No sign of self injury.  Compliant with medicine.  Past Psychiatric History:   Risk to Self: Suicidal Ideation: Yes-Currently Present Suicidal Intent: Yes-Currently Present Is patient at risk for suicide?: Yes Suicidal Plan?: Yes-Currently Present Specify Current Suicidal Plan: jumped out a window Access to Means: Yes Specify Access to Suicidal Means: environment What has been your use of drugs/alcohol within the last 12 months?: denies How many times?: 3 Other Self Harm Risks: mental status Triggers for Past Attempts: Unpredictable Intentional Self Injurious Behavior: None Risk to Others: Homicidal Ideation: No Thoughts of Harm to Others: Yes-Currently Present Comment - Thoughts of Harm to Others: thoughts of harming others-non-specific Current Homicidal Intent: No Current Homicidal Plan: No Access to Homicidal Means: No History of harm to others?: Yes Assessment of Violence: On admission Violent Behavior Description: combative upon arrival Does patient have access to weapons?: No Criminal Charges Pending?: No Does patient have a court date: No Prior Inpatient Therapy: Prior Inpatient Therapy: Yes Prior Therapy Dates: 2018 Prior Therapy Facilty/Provider(s): [facility] Reason for Treatment: schizoaffective disorder, aggression Prior Outpatient Therapy: Prior Outpatient Therapy: Yes Prior Therapy Dates: UTA Prior Therapy Facilty/Provider(s): Dr. Tamera Punt Reason for Treatment: schizoaffective disorder Does patient have an ACCT team?: No Does patient have Intensive In-House Services?  : No Does patient have [facility] services? : No Does patient have P4CC services?: No  Past Medical History:  Past Medical History:  Diagnosis Date  . Hypertension   .  Schizo-affective  schizophrenia [facility])     Past Surgical History:  Procedure Laterality Date  . COLONOSCOPY WITH PROPOFOL N/A 12/28/2017   Procedure: COLONOSCOPY WITH PROPOFOL;  Surgeon: Jonathon Bellows, MD;  Location: [facility] ENDOSCOPY;  Service: Gastroenterology;  Laterality: N/A;  . HYDROCELE EXCISION Bilateral 02/22/2018   Procedure: bilateral HYDROCELECTOMY ADULT;  Surgeon: Cleon Gustin, MD;  Location: [place];  Service: Urology;  Laterality: Bilateral;  . INSERTION OF ILIAC STENT Left 02/22/2018   Procedure: INSERTION LEFT SUPERIOR FEMORAL ARTERY USING 6MM X 5CM VIABHON STENT with mynx device closure on right femoral artery;  Surgeon: Waynetta Sandy, MD;  Location: [place];  Service: Vascular;  Laterality: Left;  . KNEE ARTHROSCOPY    . SCROTAL EXPLORATION N/A 02/22/2018   Procedure: SCROTUM EXPLORATION;  Surgeon: Cleon Gustin, MD;  Location: [place];  Service: Urology;  Laterality: N/A;  . UPPER EXTREMITY ANGIOGRAM Left 02/22/2018   Procedure: left lower EXTREMITY ANGIOGRAM;  Surgeon: Waynetta Sandy, MD;  Location: [facility] OR;  Service: Vascular;  Laterality: Left;   Family History:  Family History  Problem Relation Age of Onset  . Diabetes Mother    Family Psychiatric  History:  Social History:  Social History   Substance and Sexual Activity  Alcohol Use No     Social History   Substance and Sexual Activity  Drug Use No    Social History   Socioeconomic History  . Marital status: Single    Spouse name: Not on file  . Number of children: Not on file  . Years of education: Not on file  . Highest education level: Not on file  Occupational History  . Not on file  Social Needs  . Financial resource strain: Not on file  . Food insecurity:    Worry: Not on file    Inability: Not on file  . Transportation needs:    Medical: Not on file    Non-medical: Not on file  Tobacco Use  . Smoking status: Former Smoker  Substance and Sexual Activity  . Alcohol use: No  . Drug use:  No  . Sexual activity: Never  Lifestyle  . Physical activity:    Days per week: Not on file    Minutes per session: Not on file  . Stress: Not on file  Relationships  . Social connections:    Talks on phone: Not on file    Gets together: Not on file    Attends religious service: Not on file    Active member of club or organization: Not on file    Attends meetings of clubs or organizations: Not on file    Relationship status: Not on file  Other Topics Concern  . Not on file  Social History Narrative   ** Merged History Encounter **       Additional Social History:    Allergies:  No Known Allergies  Labs: No results found for this or any previous visit (from the past 48 hour(s)).  Medications:  Current Facility-Administered Medications  Medication Dose Route Frequency Provider Last Rate Last Dose  . clopidogrel (PLAVIX) tablet 75 mg  75 mg Oral Daily Suella Broad A, PA-C   75 mg at 04/10/18 1056  . cloZAPine (CLOZARIL) tablet 100 mg  100 mg Oral QHS Couture, Cortni S, PA-C   100 mg at 04/09/18 2102  . cloZAPine (CLOZARIL) tablet 50 mg  50 mg Oral q morning - 10a Couture, Cortni S, PA-C   50 mg at  04/10/18 1056  . diphenhydrAMINE (BENADRYL) capsule 50 mg  50 mg Oral Q6H PRN Money, [name], [name]   50 mg at 04/09/18 1525   Or  . diphenhydrAMINE (BENADRYL) injection 50 mg  50 mg Intramuscular Q6H PRN Money, Lowry Ram, [name]   50 mg at 04/10/18 1111  . divalproex (DEPAKOTE) DR tablet 500 mg  500 mg Oral BID Suella Broad A, PA-C   500 mg at 04/10/18 1056  . haloperidol (HALDOL) tablet 5 mg  5 mg Oral Q6H PRN Money, Lowry Ram, [name]   5 mg at 04/09/18 1054   Or  . haloperidol lactate (HALDOL) injection 5 mg  5 mg Intramuscular Q6H PRN Money, Lowry Ram, [name]   5 mg at 04/10/18 1631  . haloperidol lactate (HALDOL) injection 10 mg  10 mg Intramuscular Once Money, Lowry Ram, [name]      . LORazepam (ATIVAN) tablet 2 mg  2 mg Oral Q6H PRN Money, Lowry Ram, [name]   2 mg at 04/09/18 1525   Or  .  LORazepam (ATIVAN) injection 2 mg  2 mg Intramuscular Q6H PRN Money, Lowry Ram, [name]   2 mg at 04/10/18 0913  . LORazepam (ATIVAN) injection 2 mg  2 mg Intramuscular Once Money, Lowry Ram, [name]      . traZODone (DESYREL) tablet 100 mg  100 mg Oral QHS Suella Broad A, PA-C   100 mg at 04/09/18 2102  . Valbenazine Tosylate CAPS 80 mg  80 mg Oral Daily Tacy Learn, PA-C   80 mg at 04/10/18 1057   Current Outpatient Medications  Medication Sig Dispense Refill  . clopidogrel (PLAVIX) 75 MG tablet Take 1 tablet (75 mg total) by mouth daily. 30 tablet 0  . clozapine (CLOZARIL) 100 MG tablet Take 1 tablet (100 mg total) by mouth at bedtime. (Patient taking differently: Take 50-100 mg by mouth See admin instructions. 50mg  in the morning and 100mg  in the evening) 30 tablet 0  . clozapine (CLOZARIL) 50 MG tablet Take 50-100 mg by mouth See admin instructions. 50mg  in the morning 100mg  at bedtime    . divalproex (DEPAKOTE) 500 MG DR tablet Take 1 tablet (500 mg total) by mouth 2 (two) times daily. 30 tablet 0  . INGREZZA 80 MG CAPS Take 80 mg by mouth daily.    Marland Kitchen LORazepam (ATIVAN) 0.5 MG tablet Take 0.5 mg by mouth 2 (two) times daily as needed for sedation.    . traZODone (DESYREL) 100 MG tablet Take 100 mg by mouth at bedtime.    . vitamin E 200 UNIT capsule Take 200 Units by mouth daily.    . Maltodextrin-Xanthan Gum (RESOURCE THICKENUP CLEAR) POWD Take 120 g by mouth as needed. (Patient not taking: Reported on 04/07/2018) 1 Can 1    Musculoskeletal: Strength & Muscle Tone:  Gait & Station:  Patient leans:  Psychiatric Specialty Exam: Physical Exam  Psychiatric: He has a normal mood and affect. His behavior is normal.    ROS  Blood pressure 123/77, pulse (!) 116, temperature (!) 97.5 F (36.4 C), temperature source Oral, resp. rate 19, SpO2 97 %.There is no height or weight on file to calculate BMI.  General Appearance: Guarded  Eye Contact:  Fair  Speech:  Clear and Coherent  Volume:   Normal  Mood:  Irritable  Affect:  Labile  Thought Process:  Coherent  Orientation:  Other:  Person and place  Thought Content:  Rumination  Suicidal Thoughts:  Yes.  without intent/plan, was  reported that patient state's suicidal ideation often. No plan or intent.   Homicidal Thoughts:  No  Memory:  Immediate;   Fair Recent;   Fair  Judgement:  Impaired  Insight:  Lacking  Psychomotor Activity:  Normal  Concentration:  Concentration: Fair  Recall:  Poor  Fund of Knowledge:  Poor  Language:  Fair  Akathisia:  No  Handed:  Right  AIMS (if indicated):     Assets:  Armed forces logistics/support/administrative officer Housing  ADL's:    Cognition:  Chart reviewed 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  Sleep:      NP contacted EDP [facility] discharge disposition recommendation.- cleared back to group home  Treatment Plan Summary: Daily contact with patient to assess and evaluate symptoms and progress in treatment and Medication management  Disposition: Patient does not meet criteria for psychiatric inpatient admission. Refer to IOP. Discussed crisis plan, support from social network, calling 911, coming to the Emergency Department, and calling Suicide Hotline.  This service was provided via telemedicine using a 2-way, interactive audio and video technology.  Names of all persons participating in this telemedicine service and their role in this encounter. Name: Austin Gates  Role: Patient  Name: T.Meldrick Buttery Role: NP          Derrill [place], NP 04/10/2018 6:16 PM

## 2018-04-10 NOTE — ED Notes (Signed)
Pt lying in bed naked and laughing

## 2018-04-10 NOTE — ED Notes (Signed)
Pt asking for shot. Explained that the shot will make him sleepy, pt agreeable to this. Dr Lita Mains okay with giving pt haldol now, as that has been the most effective at controlling his agitation.

## 2018-04-10 NOTE — Progress Notes (Addendum)
2nd shift ED CSW received a handoff from the 1st shift [facility] ED CSW.   Plan: Follow up with the pt's Austin Gates to insure pt will be picked up.  CSW will continue to follow for D/C needs.  Alphonse Guild. Maximilliano Kersh, LCSW, LCAS, CSI Clinical Social Worker Ph: [phone]

## 2018-04-10 NOTE — Progress Notes (Addendum)
CSW called pt's legal guardian Doyce Loose at ph: [phone] and stated pt was cleared for D/C and asked when pt's legal guardianship could pick up the pt and pt's legal guardian stated, "I can't take care of him, he needs help I can't give him" and again stated she will not pick up the pt.  CSW stated pt was D/C'd, did not meet criteria for inpatient psychiatric hospital admission and CSW requested legal guardian demand group home [facility] pick up pt and pt's legal guardian stated pt needs more help and hung up.  CSW will continue to follow for D/C needs.  Alphonse Guild. Lycan Davee, LCSW, LCAS, CSI Clinical Social Worker Ph: [phone]

## 2018-04-10 NOTE — Discharge Instructions (Addendum)
Continue your current meds.   See your counselor   Return to ER if you have agitation, thoughts of harming yourself or others

## 2018-04-10 NOTE — ED Notes (Signed)
Pt pulled code blue alarm. Instructed not to pull alarms as we have been instructing throughout the day.

## 2018-04-10 NOTE — ED Notes (Addendum)
Pt requesting "a shot in the arm." PRN ativan offered, expained this will help keep him calm. Pt agreeable Making phone call to sister
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
## 2018-04-10 NOTE — ED Notes (Signed)
Pt's guardian called while pt was in the shower, updated her on pt.

## 2018-04-10 NOTE — Consult Note (Addendum)
Patient seen via tele-assessment.  He is awake alert and oriented.  Continues to report he does not want to go back to his current group home.   Patient is very adamant with returning back to current group home.  Chart review patient's medication compliant.  Patient presents irritable and continuous throughout this assessment.  Was reported by nursing staff this is patient's baseline.   Per social work staff patient has been accepted back to current living facility.  As reported by social worker patient has been residing there for many years.  See CSW note.  Case staffed with MD Dwyane Dee.  No acute safety concerns.  Patient is cleared psychiatrically.  Pending disposition per Education officer, museum and group home discharge. Patient to keep follow-up appointment with current psychiatrist.   NP contacted EDP [facility] discharge disposition recommendation.

## 2018-04-10 NOTE — ED Notes (Signed)
Pt in room yelling out for food; pt was asked to lower voice; pt continues to yell and calling staff "Bitch"; pt was asked to several times by RN to calm down but did not comply therefore patient received PRN medication for agitation; pt resting in bed at this time-Monique,RN

## 2018-04-10 NOTE — ED Notes (Addendum)
Pt wanted gatorade. Informed we do not have gatorade, now threatening nursing staff from inside room. Security at [employer] station.

## 2018-04-10 NOTE — ED Notes (Signed)
Pt calling [facility]

## 2018-04-10 NOTE — Progress Notes (Addendum)
CSW was notified by patient's RN that patient was psych cleared.  CSW did confer with NP Lewis, who verified that patient was to be psych cleared.    CSW contacted pt's group home care giver, [place] Blas [phone]) and she completed a three-way call between this writer, and patient's guardian, Chrys Racer "Olegario Shearer" Mare Ferrari [phone]).  Both the group home caregiver and patient's guardian expressed concern regarding his being recommended for d/c as his psychiatrist had wanted him to be referred to a higher level of treatment.  CSW explained process of assessment and daily reassessment and that patient's may present differently after they have been in the ED for an extended period.  [name] indicated that she was going to have to confer with her Supervisor prior to let me know when and if they will pick patient up. She promised to call this writer back to advise.  CSW called [facility] and left a vm for patient's Care Coordinator, Zipporah Plants, [phone]) requesting a call back to discuss whether group home has to give 30 day notice, or, if they have filed for an emergency eviction.  Areatha Keas. Judi Cong, MSW, [place] Disposition Clinical Social Work [phone] (cell) [phone] (office)

## 2018-04-10 NOTE — ED Notes (Addendum)
Pt continually demanding to know what time it is, repeatedly told that it is 1100 and it is 1200. Agitation increasing, insisting that lunchtime is sooner than it is/time is later than it is despite being told multiple times. Willing taking IM meds for agitation.

## 2018-04-10 NOTE — Progress Notes (Addendum)
CSW staffed case with the CSW Asst Director who suggested [place] call [facility] Supervisor Jarrett Soho at ph: [phone].    Jarrett Soho stated on 2/19 she will consult the monitoring specialist and complete a Quality of Care ([place]) on the group home to insure pt's AFL is following a correct procedure to insure pt's safety.  Jarrett Soho stated she will call the 1st shift ED CSW at [facility] and the CSW Asst Director to update them.  CSW Asst Director updated. RN updated.  CSW will continue to follow for D/C needs.  Alphonse Guild. Sirinity Outland, LCSW, LCAS, CSI Clinical Social Worker Ph: [phone]

## 2018-04-11 NOTE — ED Notes (Signed)
Pt given water, ambulated to restroom.
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
## 2018-04-11 NOTE — Progress Notes (Addendum)
CSW called [facility] and left a vm for patient's Care Coordinator, Zipporah Plants, [phone]) requesting a call back to discuss whether group home can be spoken to in regards to having to accept the pt back to their care while formulating and implementing a safe D/C plan to assist pt with obtaining a higher level of care from their facility if they deem it necessary, rather than "abandoning" the pt at the [facility] ED.  CSW is aware LCSW supervisor at [facility] is at this time initiating a QOC (Quality of Care) at [facility] to determine if they are providing adequate care for the pt and their other clients at this time as a result of the supervisor Hannah's initiative.  Care Coordinator, Zipporah Plants csn be reached directly at ph: [phone].  CSW left a VM requesting a call back.  RN updated  CSW will continue to follow for D/C needs.  Alphonse Guild. Marillyn Goren, LCSW, LCAS, CSI Clinical Social Worker Ph: [phone]

## 2018-04-11 NOTE — ED Notes (Signed)
PM snack given graham crackers x2, cookies X2, and coke cola
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
## 2018-04-11 NOTE — ED Notes (Signed)
Pt on phone with guardian.

## 2018-04-11 NOTE — ED Notes (Signed)
Pt continuously asking for a snack and sprite. Pt calling staff "bitches".  This RN redirected pt and explained that snack time is strictly at 2100 and that this behavior is not tolerated.

## 2018-04-11 NOTE — ED Notes (Signed)
Pt has pulled call bell for the 4th time today instructed that if he pulled it again he would not be getting snack after dinner at 9 pm.

## 2018-04-11 NOTE — ED Notes (Signed)
Pt denies SI/HI at this time, pt also denies hallucinations at this time. Pt has no complaints or requests at this time. Pt updated about plan of care. Pt eating breakfast.

## 2018-04-11 NOTE — ED Notes (Signed)
Austin Gates called to check on the pt. This RN asked when someone would be coming to pick up the pt. She states "I can't take care of him". This RN asked if she was abandoning the pt, she stated that she wasn't going to pick up the pt. This RN stated that we would have to contact social work and [place] for [place]. [name] asked when she could come to visit the pt, this RN gave her the visiting times and told her that if she was coming to visit the pt then she would be expected to take the pt home with her. [name] then stated that she would not be coming to visit. This RN to contact social work.

## 2018-04-12 MED ORDER — ENSURE ENLIVE PO LIQD
237.0000 mL | Freq: Two times a day (BID) | ORAL | Status: DC
  Administered 2018-04-14 – 2018-05-08 (×47): 237 mL via ORAL
  Filled 2018-04-12 (×49): qty 237

## 2018-04-12 MED ORDER — OLANZAPINE 5 MG PO TBDP: 10.0000 mg | ORAL_TABLET | Freq: Every day | ORAL | Status: DC

## 2018-04-12 MED ORDER — OLANZAPINE 5 MG PO TBDP
10.0000 mg | ORAL_TABLET | Freq: Once | ORAL | Status: AC
  Administered 2018-04-12: 10 mg via ORAL
  Filled 2018-04-12: qty 2

## 2018-04-12 MED ORDER — ZIPRASIDONE MESYLATE 20 MG IM SOLR
20.0000 mg | Freq: Once | INTRAMUSCULAR | Status: AC
  Administered 2018-04-12: 20 mg via INTRAMUSCULAR
  Filled 2018-04-12: qty 20

## 2018-04-12 MED ORDER — HYDROXYZINE HCL 50 MG PO TABS
50.0000 mg | ORAL_TABLET | Freq: Once | ORAL | Status: AC
  Administered 2018-04-12: 50 mg via ORAL
  Filled 2018-04-12: qty 1

## 2018-04-12 NOTE — Progress Notes (Signed)
CSW notified that [place] has requested that a conference call be had regarding pt. CSW to call in at 1pm when number is provided.      Austin Gates Jeannelle Wiens, MSW, [place] [facility] Clinical Social Worker [phone]

## 2018-04-12 NOTE — Progress Notes (Signed)
CSW received phone call from patients IDD care coordinator, Zipporah Plants at [phone], requesting a conference call this afternoon 2/20 at [place] will notified assigned social worker and lead.   Kingsley Spittle, LCSW Clinical Social Worker  [facility] [employer]  [phone]
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
## 2018-04-12 NOTE — ED Notes (Signed)
Sister called to check on patient. RN let her know that he has been abandoned here and she needs to come get him. She reports that "if we had done our job and gotten him evaluated before his behaviors got like this then he wouldn't have been left".

## 2018-04-12 NOTE — ED Notes (Signed)
Pt taking penis out and fondling himself- verbally redirected to stop.

## 2018-04-12 NOTE — ED Notes (Signed)
Pt continuing to pace in his room, pt comes out to the desk to ask for food, I informed him his dinner tray will be here soon and redirected pt back into his room.

## 2018-04-12 NOTE — ED Notes (Signed)
Sandwich bag with cheese, graham crackers, applesauce given to pt along with sprite. Cooperative at this time. Will continue to monitor. Sitter, EMT, and security in purple with this RN.

## 2018-04-12 NOTE — ED Notes (Signed)
Pt constantly stating he is hungry and coming out if his room. This RN and his sitter continue to redirect him back into his room. Per dietary his tray is on the way up.

## 2018-04-12 NOTE — ED Notes (Signed)
Dinner tray at bedside

## 2018-04-12 NOTE — ED Notes (Addendum)
Pt woke up and is in the shower. 3pm snack at bedside, graham crackers and caffeine free coke.

## 2018-04-12 NOTE — ED Notes (Addendum)
Pt yelling in room. Wanting more food. Pt advised when snack time is. Water and crackers given to patient. Benadryl given. Will possibly have to give pt an injection for behavior. Sitter at bedside. Pt kicking side rails and cursing. Yelling. Security called for presence.
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
## 2018-04-12 NOTE — ED Notes (Addendum)
Charge RN aware of pts aggressive behavior and security called to bedside again. Requested that one officer stay here in purple with this RN and sitter due to his unpredictable behavior. Pt cooperative at this time and took all PO meds. Will lay in bed and curse at times. Will stop when this RN asks him to stop. Pt needs to be medicated every six ours no matter what.

## 2018-04-12 NOTE — ED Notes (Signed)
Pt using phone at this time at nurses desk
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
## 2018-04-12 NOTE — Progress Notes (Signed)
CSW has followed up with IDD Care Coordinator Mark at [phone]. CSW left message for him asking that he call CSW back to discuss further needs of pt at this time. CSW awaits call back at this time.     Austin Gates, MSW, [facility] Clinical Social Worker [phone]

## 2018-04-12 NOTE — ED Provider Notes (Signed)
At this time the patient has been medically cleared and was seen and evaluated by psychiatry on April 10, 2018 and cleared from a psychiatric standpoint at that time.  He still has had some agitation here in the ER and at this time the group home is refusing to take him back.  Case management and social work are involved.  He required IM Geodon tonight for acute agitation.  He will benefit from psychiatry consultation for medication adjustments.  In looking at his medications he does not appear to be on antipsychotic and it seems like there is definitely ability to move in this regard.  Apparently he was last seen by his outpatient psychiatrist on the 21st and some additional medications were made but has had increasing agitation at the group home and was referred to the ER for acute psychiatric hospitalization.  It was thought of the psychiatric team that he would not need acute psychiatric hospitalization.  Despite this he continues to be agitated.  Psychiatric consultation is appreciated.   Jola Schmidt, MD 04/12/18 [phone]

## 2018-04-12 NOTE — ED Notes (Addendum)
Pt continues to come out of room. Continued to be verbally redirected over and over by RN and sitter and pt becoming verbally aggressive towards sitter.

## 2018-04-12 NOTE — ED Notes (Signed)
Pt pacing in room and pulled call bell emergency button
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
## 2018-04-13 LAB — CBC WITH DIFFERENTIAL/PLATELET
Abs Immature Granulocytes: 0.11 10*3/uL — ABNORMAL HIGH (ref 0.00–0.07)
Basophils Absolute: 0 10*3/uL (ref 0.0–0.1)
Basophils Relative: 0 %
Eosinophils Absolute: 0.1 10*3/uL (ref 0.0–0.5)
Eosinophils Relative: 2 %
HCT: 37.6 % — ABNORMAL LOW (ref 39.0–52.0)
Hemoglobin: 11.5 g/dL — ABNORMAL LOW (ref 13.0–17.0)
Immature Granulocytes: 2 %
Lymphocytes Relative: 18 %
Lymphs Abs: 1 10*3/uL (ref 0.7–4.0)
MCH: 28.8 pg (ref 26.0–34.0)
MCHC: 30.6 g/dL (ref 30.0–36.0)
MCV: 94.2 fL (ref 80.0–100.0)
MONO ABS: 0.6 10*3/uL (ref 0.1–1.0)
Monocytes Relative: 10 %
Neutro Abs: 4 10*3/uL (ref 1.7–7.7)
Neutrophils Relative %: 68 %
PLATELETS: 150 10*3/uL (ref 150–400)
RBC: 3.99 MIL/uL — ABNORMAL LOW (ref 4.22–5.81)
RDW: 13.7 % (ref 11.5–15.5)
WBC: 5.8 10*3/uL (ref 4.0–10.5)
nRBC: 0.5 % — ABNORMAL HIGH (ref 0.0–0.2)

## 2018-04-13 MED ORDER — CLOZAPINE 100 MG PO TABS
50.0000 mg | ORAL_TABLET | Freq: Two times a day (BID) | ORAL | Status: DC
  Administered 2018-04-14 – 2018-04-15 (×3): 50 mg via ORAL
  Filled 2018-04-13 (×5): qty 1

## 2018-04-13 MED ORDER — CLOZAPINE 25 MG PO TABS
150.0000 mg | ORAL_TABLET | Freq: Every day | ORAL | Status: DC
  Administered 2018-04-13 – 2018-05-07 (×25): 150 mg via ORAL
  Filled 2018-04-13 (×29): qty 2
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
## 2018-04-13 NOTE — ED Provider Notes (Signed)
Pt is currently without a place to be discharged to eventhough he is medically cleared.  He is receiving Haldol/Benadryl/Ativan q6hrs due to baseline agitation.  He just had his morning shower.  Currently resting comfortably.     Domenic Moras, PA-C 04/13/18 Harlin Rain    Charlesetta Shanks, MD 04/13/18 0830

## 2018-04-13 NOTE — ED Notes (Signed)
Breakfast tray ordered 

## 2018-04-13 NOTE — ED Notes (Signed)
Patient was given Austin Gates, Short [employer] Cookies, and Cup of Sprite for Snack and A Regular Diet was ordered for Lunch.
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
## 2018-04-13 NOTE — Progress Notes (Addendum)
Late Entry:  CSW's from [facility], [facility], [facility] staff, [name]  and pt's MD's out side of the hospital all participated in conference call on 2/202/20 at [place] was advised that Group Home is not willing to take pt back at this time as they feel that pt is not stable enough to be back at the home at this time. On several occassions all CSW's on call attempted to explained to [name] (pt's legal guardian), and group home staff that pt has been seen in the ED by psych and pt has been cleared. [name] with [facility] explained to all parties that pt was seen and then reevaluate and they felt that pt DID NOT meet criteria for inpt rehab. Pt's legal guardian [name] asked multiple times that WE ([facility] staff) place pt into a long term mental health facility. CSW [name] advised her that we do not do this- but would have been glad to place pt into a mental health facilities if it wad suggested. [name] advised all parties once more that originally pt was suggested to be sent to inpt psych however pt has since then been seen and cleared meaning pt no longer needs that level of care.    Care Coordination team expressed that they have to honor pt's legal guardians wishes and it appeared that her wishes were for pt to stay in the ED. CSWs once again advised all parties on the call that the ED is not a housing unit for pt's and recommended that the group home or legal guardian come and get pt and then work with [place] to have pt placed into that long term facility that they requested. Initially another psych consult was asked to be placed. ED CSW on 1st shift spoke with [name] who expressed that he would not be placing another consult as pt had already been seen and cleared by psych. [name] expressed that the outcome would likely be the same-pt being cleared.    At this time it has been brought to ED CSW's attention  that pt remains in the ED with no plan. Per [name]  Note it appears that psych consult was placed yesterday evening for further needs and medication managment. CSW will continue to follow for further recommendations as well as speak with [name] on what else CSW's can do to assist pt in ensuring that his needs are met.      Austin Gates, MSW, [place] [facility] Clinical Social Worker [phone]

## 2018-04-13 NOTE — ED Notes (Signed)
Pt given snack after finishing lunch tray

## 2018-04-13 NOTE — ED Notes (Signed)
Pt male sitter left for break and replaced with male. Pt rattling bed and telling staff to "go to hell". Has not yet left room

## 2018-04-13 NOTE — ED Notes (Signed)
Ensure ordered from pharm

## 2018-04-13 NOTE — Progress Notes (Signed)
Late post:   CSW invited to conference call with patient current providers at [facility] on Thursday 2/20; Austin Gates (medically director), Austin Gates care coordinator, legal guardian, out patient psychiatrist, and group home representatives all present during phone call.   Patients providers voiced concerns with patient being psychiatrically at this time and needing inpatient psychiatric placement/ medication adjustments due to continued aggression outpatient/ here in the ED. Providers were informed patient has been medically/ psychiatrically cleared at this time however new consult could be placed for a second opinion and evaluation of medications. CSW reinforced that in the event patient was psychiatrically cleared again, providers/ group home would need to think of a safe disposition plan for patient- whether that would be a new group home or back home with guardian.   CSW discussed patient at length with Austin Gates, EDP, who agreed to place psychiatric consult for medication adjustments. CSW available for additionally assistance if needed.   Kingsley Spittle, LCSW Clinical Social Worker  [employer]  [phone]

## 2018-04-13 NOTE — ED Notes (Signed)
Pt given meds and sprite, up to br

## 2018-04-13 NOTE — ED Notes (Signed)
Pt removed all clothing and is beating on bed and yelling for staff to " Go to hell". Pt kicking bed and trying to rip bed remote off. Security called and at bedside. MD ok with giving oral PO meds 1 hour early. Pt compliant with oral meds. Pt in bed with graham crackers and water.

## 2018-04-13 NOTE — Progress Notes (Signed)
I was contacted to her review patient's chart for medication changes due to his continued agitation and behavior in the emergency department.  Reviewed patient's chart and discovered that in August of last year patient was on 400 mg of Clozaril in the morning and was at a max of 450 mg of Clozaril at bedtime for a total of 850 mg a day.  The note also reports that the patient had been on 800 mg a day for the last 12 months prior to August 2019.  It appears the patient's medication had been decreased significantly and now he is on 50 mg daily and 100 mg at bedtime.  Reviewed chart with Dr. Jake Samples and Dr. Dwyane Dee and decided to increase the Clozaril to 50 mg p.o. twice daily and 150 mg p.o. nightly.  I also reordered a CBC to ensure that there is no signs of agranulocytosis.  I have attempted to contact Dr. Maryan Rued to inform her of the decisions and the conversations about the patient's medications that she has been unavailable.  If Dr. Maryan Rued wishes to contact me she can reach me at [phone].

## 2018-04-13 NOTE — ED Notes (Signed)
Lunch tray at bedside. ?

## 2018-04-13 NOTE — ED Notes (Signed)
Patient pulled Code Blue Light.

## 2018-04-13 NOTE — ED Notes (Signed)
Pt asking about lunch tray numerous times. Updated pt on lunch times and encouraged pt to stay in room. Pt in and out of bathroom multiple times

## 2018-04-14 NOTE — ED Notes (Signed)
Pt asking for snack. Advised pt it is not snack time. Pt ambulated to nurses' desk - stating he wanted to show which snacks he wants. Pt then grabbed a couple of packs of graham crackers after nurse advised him not to do so. Pt then returned to his room and ate them. Pt noted to be cursing and yelling, calling staff names. Refusing x 2 for VS's to be taken.

## 2018-04-14 NOTE — ED Notes (Signed)
Pt slept briefly and is now awake - pt had urinary incont episode - talking loudly, cursing, and threatening Security.
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
## 2018-04-14 NOTE — ED Notes (Signed)
Pt drank 100% of Ensure and ate 100% of lunch. Pt called his Legal Guardian, Mrs Mare Ferrari, then returned to room. Pt noted to be talking loudly asking for more food. Advised pt he may have snack at 1500. Pt voices understanding then continues to ask. Encouraging pt to stay in his room. Offering distractions - color, watch tv, look at a book - declines.
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
## 2018-04-14 NOTE — ED Notes (Signed)
Pt given snack and noted to be lying on bed, alert, calm, cooperative at this time.

## 2018-04-14 NOTE — ED Notes (Signed)
Patient was given a snack and drink and a Regular Diet was ordered for Lunch.

## 2018-04-14 NOTE — ED Notes (Signed)
Sitter and Security asking pt to stay in room and that snacks will be given at 1000 as pt asking continuously.

## 2018-04-14 NOTE — ED Notes (Signed)
Pt in shower.  

## 2018-04-14 NOTE — ED Notes (Signed)
Pt is making verbal threats towards this sitter. Tired redirecting patient , the situation became worst.  More threats were verbalized.

## 2018-04-14 NOTE — ED Notes (Signed)
Pt continues w/yelling and cursing - staff attempting to de-escalate.

## 2018-04-14 NOTE — ED Notes (Signed)
Pt noted to be cursing and yelling. Pt will lower voice after much encouragement then starts again.

## 2018-04-14 NOTE — ED Notes (Signed)
Patient was given  snacks and Coke.

## 2018-04-15 ENCOUNTER — Encounter (HOSPITAL_COMMUNITY): Payer: Self-pay | Admitting: Behavioral Health

## 2018-04-15 LAB — BASIC METABOLIC PANEL
Anion gap: 10 (ref 5–15)
BUN: 19 mg/dL (ref 6–20)
CALCIUM: 9.4 mg/dL (ref 8.9–10.3)
CO2: 22 mmol/L (ref 22–32)
Chloride: 104 mmol/L (ref 98–111)
Creatinine, Ser: 1.13 mg/dL (ref 0.61–1.24)
GFR calc Af Amer: >60 mL/min (ref >60)
GFR calc non Af Amer: >60 mL/min (ref >60)
Glucose, Bld: 119 mg/dL — ABNORMAL HIGH (ref 70–99)
Potassium: 4.8 mmol/L (ref 3.5–5.1)
Sodium: 136 mmol/L (ref 135–145)

## 2018-04-15 LAB — TSH: TSH: 0.76 u[IU]/mL (ref 0.350–4.500)

## 2018-04-15 LAB — VALPROIC ACID LEVEL: Valproic Acid Lvl: 47 ug/mL — ABNORMAL LOW (ref 50.0–100.0)

## 2018-04-15 MED ORDER — CLOZAPINE 25 MG PO TABS
50.0000 mg | ORAL_TABLET | Freq: Once | ORAL | Status: AC
  Administered 2018-04-15: 50 mg via ORAL
  Filled 2018-04-15: qty 2

## 2018-04-15 MED ORDER — DIVALPROEX SODIUM 250 MG PO DR TAB
1000.0000 mg | DELAYED_RELEASE_TABLET | Freq: Every day | ORAL | Status: DC
  Administered 2018-04-15 – 2018-05-07 (×23): 1000 mg via ORAL
  Filled 2018-04-15 (×23): qty 4

## 2018-04-15 MED ORDER — DIVALPROEX SODIUM 250 MG PO DR TAB
500.0000 mg | DELAYED_RELEASE_TABLET | Freq: Every day | ORAL | Status: DC
  Administered 2018-04-16 – 2018-05-08 (×24): 500 mg via ORAL
  Filled 2018-04-15 (×23): qty 2

## 2018-04-15 MED ORDER — CLOZAPINE 25 MG PO TABS
50.0000 mg | ORAL_TABLET | Freq: Every day | ORAL | Status: DC
  Administered 2018-04-15 – 2018-05-08 (×20): 50 mg via ORAL
  Filled 2018-04-15 (×31): qty 2

## 2018-04-15 MED ORDER — CLOZAPINE 100 MG PO TABS
100.0000 mg | ORAL_TABLET | Freq: Every day | ORAL | Status: DC
  Administered 2018-04-16 – 2018-04-21 (×6): 100 mg via ORAL
  Filled 2018-04-15 (×7): qty 1

## 2018-04-15 NOTE — ED Notes (Addendum)
Pt assessed by Dr Johnney Killian. Pt denies any c/o chest pain or shortness of breath. Pt on phone w/Mrs Lauris Poag at nurses' desk. Pt asking for food - advised pt may have snack at 1500.

## 2018-04-15 NOTE — ED Notes (Signed)
Lunch tray ordered 

## 2018-04-15 NOTE — ED Notes (Addendum)
Dinner tray ordered.

## 2018-04-15 NOTE — ED Notes (Addendum)
Pt pacing in room and hallway.

## 2018-04-15 NOTE — ED Provider Notes (Addendum)
Patient nurse advises that his heart rate has been elevated.  Patient's heart rate is been in the 130s.  No sign of change clinically.  Patient is very active and continually requesting more food and meal trays.  I have examined the patient.  He immediately got out of bed when I came to the room.  He was completely naked in bed and stood up to put on his purple scrubs.  He reported he wanted to go on a walk.  He was interactive and cooperative but needed frequent directions.  Patient is not endorsing any chest pain or shortness of breath.  He reports he feels fine and wants more food and go for a walk.  Patient is alert he shows no signs of physical limitation.  He is up and down out of the bed without difficulty.  He objectively does not appear short of breath.  Heart is regular and tachycardic.  Breath sounds are symmetric and clear.  Abdomen is soft and nondistended.  No lower extremity edema or calf pain.  Unclear at this time why patient's heart rate is elevated.  Blood pressures are stable.  He is not clinically ill in appearance.  Will add TSH, chemistry panel and EKG.  And Depakote level.  Chart reviewed does not indicate patient is supposed to be on beta-blocker or other rate control medication.  Will review results and determine if interventions indicated.   Charlesetta Shanks, MD 04/15/18 1414 Patient is lab work is stable.  TSH normal.  Patient shows no signs of dehydration.  He has been aggressively eating and drinking.  Patient does not complain of chest pain or shortness of breath.  He does not have any hypoxia.  Will add DVT studies although clinically no objective swelling but patient has had prolonged hospitalization.  Blood pressures are stable and not showing signs of benzodiazepine withdrawal.  Patient has been periodically treated with benzodiazepine for aggressive behavior.  No sign that patient would ever have alcohol withdrawal due to lack of alcohol abuse and persistently negative  alcohol levels.  EKG does not have ischemic appearance.  I do not see medications that would suggest patient would have rebound tachycardia.  He is not having any associated rebound hypertension.  Will have observe overnight for resting heart rate when patient is calm.  He has been very active today.    Charlesetta Shanks, MD 04/15/18 [phone]
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
## 2018-04-15 NOTE — ED Notes (Signed)
Pt given snack w/[employer]. Pt continues to yell at staff, cursing, and threatening at times.

## 2018-04-15 NOTE — ED Notes (Signed)
Pt talking to staff - able to be re-directed.

## 2018-04-15 NOTE — ED Notes (Signed)
Telepsych being performed. 
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
## 2018-04-15 NOTE — ED Notes (Addendum)
Pt on phone at nurses' desk - attempted to call Lauris Poag - no answer.

## 2018-04-15 NOTE — ED Provider Notes (Signed)
Patient is a 58 year old male who was living at a group home, however group home will not take him back due to his increased agitation and aggressive behavior.  This behavior has been witnessed in the emergency department several times by nursing staff.  He has been in the emergency department for 189 hours at this point.  He has been medically and psychiatrically cleared, however psychiatry has recently increased his Clozaril in hopes to improve his aggression.  He still has been requiring IM injections of Haldol, Benadryl and Ativan as recently as yesterday.  Spoke with his nursing staff at length and report that he will have episodes at least once or twice a day of aggressive behavior that is not conducive for a group home.  Given that he is continuing to need IM antipsychotic medications despite being in the emergency department for over a week and psych has been increasing his medications, I believe it is most likely in the best interest of this patient to have an inpatient psychiatric hospitalization for further stabilization and treatment.  I will reconsult TTS to do another evaluation on him.    [name], Ozella Almond, PA-C 04/15/18 [id number]    Milton Ferguson, MD 04/15/18 [phone]

## 2018-04-15 NOTE — ED Notes (Signed)
Pt continues w/restlessness and repeatedly asking for food. Aware lunch will be delivered soon.

## 2018-04-15 NOTE — ED Notes (Signed)
Pt repeatedly asking for snacks - snacks given by [employer]. Pt noted to be irritable at times then apologizing to staff and Security.

## 2018-04-15 NOTE — Progress Notes (Signed)
CSW received consult to assist in discharge planning. CSW noted TTS has been consulted. CSW will provide support as needed for disposition. CSW notes possible option to seek inpatient psychiatric hospitalization.  Lamonte Richer, LCSW, [place] Worker II [phone]

## 2018-04-15 NOTE — ED Notes (Signed)
Messaged Dr Johnney Killian re: pt's HR 130.

## 2018-04-16 ENCOUNTER — Emergency Department (HOSPITAL_BASED_OUTPATIENT_CLINIC_OR_DEPARTMENT_OTHER): Payer: Medicaid Other

## 2018-04-16 DIAGNOSIS — R000 Tachycardia, unspecified: Secondary | ICD-10-CM | POA: Diagnosis not present

## 2018-04-16 NOTE — Progress Notes (Signed)
Bilateral lower extremity venous duplex completed - Negative - Full preliminary results in Chart Review CV Proc. [facility], [place] 04/16/2018 11:01 AM
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
## 2018-04-16 NOTE — Progress Notes (Addendum)
10:30am-CSW made aware by [facility] that pt is not an candidate for placement at this time as pt doesn't meet criteria for that form of placement. CSW has updated Care Coordination team of [place] with this.   8:38am-CSW spoke with Care Coordinator Elta Guadeloupe and was informed that they still do not have a plan for pt at this time. Per Elta Guadeloupe he can not override what the Legal Guardian wants and legal guardian is expressing that pt needs to be placed into a mental health hospital. CSW advised him that once again we do not do that in the ED or from [facility] for long term care. Mark expressed understanding that and that he would reach out to his team to let them know that pt hs been cleared and is ready to be picked up.   CSW still awaits call from [place] at this time.   7:33am-CSW followed up with pt's care Coordinator Elta Guadeloupe to give update that pt has been seen by psych and has been cleared again. CSW left message with him asking that he call CSW back to determine pt's discharge plan.  CSW continues to follow for further CSW needs. CSW aware that pt has been seen and cleared again by psych at this time. CSW has followed back up with [name] to see what next steps are in getting pt the needed care. CSW awaits response at this time.    Virgie Dad Bravery Ketcham, MSW, [place] [facility] Clinical Social Worker [phone]

## 2018-04-16 NOTE — ED Notes (Signed)
Breakfast Tray ordered  

## 2018-04-16 NOTE — BH Assessment (Addendum)
Patient medically cleared by Marvia Pickles, NP 04/15/2018. However, Per staff meeting with clinical staff and Dr. Dwyane Dee, patient to be referred to [facility] for placement. The reason for the referral is due to a continuation of behaviors and outburst. Contacted [facility] and was informed that this patient has a IDD diagnosis. Based on the notes and most documented behaviors (yelling, outburst, etc.) he will not meet criteria for [facility] Exception criteria. Discussed with [facility] SW Jeanette Caprice) and she will follow up with patient's Care Coordinator to further work on patient's disposition.

## 2018-04-16 NOTE — ED Provider Notes (Signed)
Patient seen by psych again and is psychiatrically cleared. Had elevated HR's yesterday, therefore further workup added medically. Labs were reassuring. He does have DVT study still pending - hopeful this will be done this am. HR was better this am at 101. He is resting comfortably currently without complaint. Social work active in helping with dispo planning.    Jahnya Trindade, Ozella Almond, PA-C 04/16/18 [id number]    Lajean Saver, MD 04/16/18 [phone]

## 2018-04-16 NOTE — ED Notes (Signed)
Pt called sister Rip Harbour. Sister [name] called in, asking for update. Told pt is sleeping, she advised to call cell phone and hung up. Informed social worker that [name] has phone, advised to reiterate that pt is waiting to be picked up. Did not answer when I called back.

## 2018-04-16 NOTE — ED Notes (Signed)
Olegario Shearer (legal guardian) called back. Speaking with pt now, updated by this RN that patient is medically and psychiatrically cleared and that he no longer needs to be a patient here. Legal guardian states this is a "difference of opinion" and she is "not equipped" to pick pt up.

## 2018-04-16 NOTE — ED Notes (Signed)
Pt continues to attempt to hug sitter, redirectable.

## 2018-04-17 ENCOUNTER — Encounter (HOSPITAL_COMMUNITY): Payer: Self-pay | Admitting: *Deleted

## 2018-04-17 NOTE — ED Notes (Signed)
Pt continues w/restlessness.

## 2018-04-17 NOTE — ED Notes (Signed)
Pt called his sister, Jonathon Jordan. Aware last phone call for the day.

## 2018-04-17 NOTE — Progress Notes (Signed)
LATE ENTRY:   CSW received call back from [place] with [facility]. CSW was advised that the team had spoken however Elta Guadeloupe was unable to to give CSW a dispositon for pt. Elta Guadeloupe sought further details on the [facility] referral that [facility] Disposition CSW has mentioned. CSW advised Elta Guadeloupe that he should speak directly to her as ED CSW was not the one who worked on that therefore was unable to give accurate information on the status of the [facility] referral.      Virgie Dad. Patricia Perales, MSW, [facility] Clinical Social Worker [phone]

## 2018-04-17 NOTE — Progress Notes (Signed)
CSW received call from patients care coordinator, Austin Gates with [name] is looking into a voluntary admission to [facility] for further psychiatric treatment. Care coordinator requested MAR/ recent medication list- CSW scanned copy to care coordinator.   Care coordinator also request we refrain from using IM injections, unless absolutely needed, in order too seek placement into psychiatric facility.   CSW will continue to follow.   Kingsley Spittle, [place]  [phone]

## 2018-04-17 NOTE — ED Notes (Signed)
Pt noted to be restless - ambulates back and forth to bathroom and to nurses' desk.

## 2018-04-17 NOTE — ED Notes (Signed)
Regular Diet was ordered for Lunch. 

## 2018-04-17 NOTE — ED Notes (Signed)
Patient was given any snack at this time.

## 2018-04-17 NOTE — ED Notes (Signed)
Pt repeatedly asking for food, restless - able to redirect pt.
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
## 2018-04-17 NOTE — ED Notes (Signed)
Dinner ordered 

## 2018-04-18 ENCOUNTER — Emergency Department (HOSPITAL_COMMUNITY): Payer: Medicaid Other

## 2018-04-18 ENCOUNTER — Encounter (HOSPITAL_COMMUNITY): Payer: Self-pay | Admitting: Radiology

## 2018-04-18 MED ORDER — ACETAMINOPHEN 500 MG PO TABS
500.0000 mg | ORAL_TABLET | Freq: Four times a day (QID) | ORAL | Status: DC | PRN
  Administered 2018-04-18 – 2018-05-07 (×9): 500 mg via ORAL
  Filled 2018-04-18 (×10): qty 1

## 2018-04-18 NOTE — ED Notes (Signed)
Pt talking with sister on the phone.

## 2018-04-18 NOTE — ED Notes (Signed)
Pt to xray

## 2018-04-18 NOTE — ED Notes (Signed)
Pt cooperative and calm. Has to be redirected constantly. Pt tries to hug this RN and sitters. Knows he is only supposed to give thumbs up or "bump fists". Pt walks to restroom with steady gait. Dancing in his room and doorway.

## 2018-04-18 NOTE — ED Notes (Signed)
Breakfast tray ordered 

## 2018-04-18 NOTE — ED Notes (Signed)
Pt came out of room, soaked with urine. States that he wet the bed. Pt helped to shower. Bed cleaned and changed.

## 2018-04-18 NOTE — ED Notes (Addendum)
Pt up and naked in room. States that he wet the bed again. Pt ambulating to restroom. While in restroom, pt urinated all over the floor and slipped back and struck his head on the wall. No LOC. Denies any pain to head. C/o "a little pain to lower back". Pt helped up, dressed, and ambulated to hall chair while we cleaned his bed. Pt then to room and helped to bed. Answers all questions correctly. A&Ox 4. Will let MD know about pt. And continue to monitor.

## 2018-04-18 NOTE — ED Notes (Signed)
Patient was given a [place] Sandwich bag w/ Cookies.

## 2018-04-18 NOTE — ED Notes (Signed)
Pt continues to wander around. Responding well to verbal requests to go back to room at this time.
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
## 2018-04-18 NOTE — ED Notes (Signed)
Pt returned from xray and CT.

## 2018-04-18 NOTE — ED Notes (Signed)
Patient is resting comfortably. 

## 2018-04-18 NOTE — ED Notes (Signed)
Pt at nurses desk, grabbing my cup and anything else he can get at the desk. Redirected pt back to his room. Pt loud at times but will be quiet after asked to lower his voice.

## 2018-04-18 NOTE — ED Notes (Signed)
[facility] notified that pt fell in restroom. Will come to see pt.

## 2018-04-18 NOTE — ED Notes (Signed)
Pt resting comfortably at this time.

## 2018-04-18 NOTE — ED Notes (Signed)
Breakfast Tray Ordered. 

## 2018-04-18 NOTE — ED Notes (Signed)
Patient was given [employer], [employer], and [employer].

## 2018-04-18 NOTE — ED Notes (Signed)
Regular Diet was ordered for Lunch. 

## 2018-04-18 NOTE — Progress Notes (Addendum)
1:36pm-CSW spoke with Care Coordinator Elta Guadeloupe. CSW was advised that they are still looking for further placement. CSW was advised that per Elta Guadeloupe they are still honoring what the legal guardian of pt wants. CSW expressed understanding but expressed that pt can not remain in the ED if medically  stable and cleared by psych. CSW advised Elta Guadeloupe that pt has been seen by psych and medications have been adjusted as needed. Mark expressed understanding the hospital's point of view but again expressed that legal guardian doesn't feel taht pt is safe to be out in the community at this time. CSW advised Elta Guadeloupe that a plan for placement needs to be being dicussed in meeting possible so that legal guardian and Care Coordination team and get pt the help needed. Elta Guadeloupe expressed that they all have been working on thing with other state hospitals however per Elta Guadeloupe the hospitals tell him "we will go with whatever [facility] says and if they say that pt is cleared then pt is cleared". CSW expressed understanding to [facility] but again suggested that pt can not remain in the ED.    CSW spoke with Nathaniel Man to give update and was informed to call Legal Guardian back to see if LME is abel to find a new Group Home for pt to go to would she be agreeable to this. CSW spoke with [name] and was informed that she is open to a new group home however not until pt is stable. CSW expressed to her that per report pt is stable at this time. Time. [name] expressed "I don't care what y'all say he is not stable". CSW thanked legal guardian for call and discontinued conversation at this time me as Zack expressed that Darryl with APS would need to be involved if legal guardian isn't agreeable to anything else aside from [facility]acement    CSW received call from [place] with the social work department. CSW was asked for further updates on pt at this time. CSW advised Zack that per chart it appeared that Care Coordination with [facility]  had been looking into getting pt to [facility] but would follow back up with CSW once decsion was made. Zack epressed that eh had spoken with [name] from [facility] and was informd that this was not an option for pt, therefore CSW was saked what else was beign done. CSW Fonnie Birkenhead that CSW had been awaiting call from [facility] with pt's Care Coordination Team [phone] to see if any updates were available on this [alcemetn for pt. CSW reached out ot [place] and left message at this time explaining to him that pt is ready to be picked up and is no longer needing to be in the ED. CSW also reached out to pt' legal Guardian in chart to reiterate the same thing. CSW spoke with [name] and was informed that she and Care Coordination team have been looking for placement for pt with no leads at this time. [name] expressed that she has been checking into places for pt and still get no call backs. She expressed that she thought that since pt was IVC'd initially that this would get pt placement. CSW explained to her that IVC's do not guarantee placement  for pt's. [name] expressed that EMS brought pt to the wrong hospital any way and he was suppose to be taken to [facility] as they have a unit specifically for [facility] pt's. CSW explained and confirmed [name] frustration with this entire situation and suggested  that she keep in contact with Elta Guadeloupe from [place] to ensure that pt is being looked at for long term placement. CSW advised her that hospital staff is no longer searching for placement for pt as pt is not meeting criteria for placement at this time. [name] expressed understanding and that she still is not coming to get pt as pt is dangerous and she is getting up in age and can not care for pt. She report not being trained in caring for pt.     CSW continued to express the importance of coming to get pt or having a place for CSW to discharge pt to as he is ready to leave the ED. [name] expressed that he would reach out  to  [place] with [facility] and then get back with CSW. CSW advised her that CSW has already reached out to [place] with no answer or response at this time. Legal guardian [name] understands that the hospital is not the  place for pt and that pt is not benefiting from being in the ED at this time. CSW advised her  that CSW would be reaching out to APS  At the request of Zack to follow up with Reva Bores. CSW left message for Darryl asking that he call CSW back to discuss this pt. CSW awaits call at this time.      Virgie Dad Zyion Leidner, MSW, [facility] Clinical Social Worker [phone]

## 2018-04-18 NOTE — ED Notes (Signed)
MD in room speaking with pt.  ?

## 2018-04-19 NOTE — ED Notes (Signed)
Breakfast tray ordered, no sharps

## 2018-04-19 NOTE — ED Notes (Signed)
Attempting to remove paper towel holder from wall. Redirectable but verbally aggressive at this time

## 2018-04-19 NOTE — ED Notes (Signed)
Pt is talking excessively loud and keeps getting up to the bathroom; Pt attempting to hug sitters and asking sitters to be his wife; Pt standing in hallway singing and dancing; pt has been redirected to his room and given medication-Monique,RN
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
## 2018-04-19 NOTE — ED Notes (Signed)
Pt called legal guardian, talking to her on the phone. Informed that pt can be picked up

## 2018-04-19 NOTE — ED Notes (Addendum)
Pt talking to sister on the phone.

## 2018-04-19 NOTE — ED Notes (Signed)
Becoming intermittently agitated and verbally aggressive with staff and security manning nurses station, beating on wall. Per Dr Gustavus Messing may give PRNS early

## 2018-04-19 NOTE — ED Notes (Signed)
Pt in room and tore the paper towel holder off the wall; pt states he did not mean to pull it off the wall; RN will have facilities to fix; Towel holder placed at [employer]

## 2018-04-20 ENCOUNTER — Emergency Department (HOSPITAL_COMMUNITY): Payer: Medicaid Other

## 2018-04-20 NOTE — ED Provider Notes (Signed)
2:26 PM patient had a witnessed episode of choking while eating his meal this evening, I was called to Behavioral health where I perform Heimlich maneuver x 3. Patient began coughing and tried to drink some water. Patient was ambulated to the bathroom, where he sat down and started to defecate. She was walked back to behavioral health where he laid on the ground and then later coughed and resumed speaking, according to nursing staff a big piece of meat came out of his mouth.  Patient is currently at the bedside drinking out of a cup with a straw along with talking, Dr. Sherry Ruffing and I walked over to behavioral and valuated patient, reports he is eating appropriately.      Janeece Fitting, PA-C 04/23/18 [place], Gwenyth Allegra, MD 04/23/18 [phone]

## 2018-04-20 NOTE — Progress Notes (Signed)
LATE ENTRY:   CSW made aware by Anner Crete on yesterday that she has reached out to Freda Jackson with [facility] and is awaiting a response from her at this time. CSW still following for further needs.      Virgie Dad Cianna Kasparian, MSW, [place] [facility] Clinical Social Worker [phone]

## 2018-04-20 NOTE — ED Notes (Addendum)
Pt continues to choke on food, then laughing at tech cleaning up spitted out food. Will continue to monitor to determine if this is being done intentionally.
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
## 2018-04-20 NOTE — ED Notes (Addendum)
Pt was asleep, went to get up and slipped on the floor. Pt was barefoot, had had socks placed by tech before he went to bed. Unbenownst to staff, pt had urinated on self and floor. C/o headache. Dr Gustavus Messing to bedside
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
## 2018-04-20 NOTE — ED Notes (Addendum)
Pt had episode of choking, spit out large chunk of unchewed meat after a few minutes of coughing and choking. No distress following episode, eating and breathing normally.  Pt picked up water bottle that wasn't his, said, "Who are you talking to?" at RN when he was corrected, walked toward nurses station with fists raised. Security to purple, walked back to room on his own.

## 2018-04-21 LAB — CBC
HCT: 37.9 % — ABNORMAL LOW (ref 39.0–52.0)
Hemoglobin: 11.4 g/dL — ABNORMAL LOW (ref 13.0–17.0)
MCH: 28.3 pg (ref 26.0–34.0)
MCHC: 30.1 g/dL (ref 30.0–36.0)
MCV: 94 fL (ref 80.0–100.0)
Platelets: 188 10*3/uL (ref 150–400)
RBC: 4.03 MIL/uL — AB (ref 4.22–5.81)
RDW: 14.3 % (ref 11.5–15.5)
WBC: 4.4 10*3/uL (ref 4.0–10.5)
nRBC: 0.7 % — ABNORMAL HIGH (ref 0.0–0.2)

## 2018-04-21 LAB — VALPROIC ACID LEVEL: Valproic Acid Lvl: 62 ug/mL (ref 50.0–100.0)

## 2018-04-21 MED ORDER — CLOZAPINE 25 MG PO TABS
150.0000 mg | ORAL_TABLET | Freq: Every day | ORAL | Status: DC
  Administered 2018-04-22 – 2018-05-08 (×17): 150 mg via ORAL
  Filled 2018-04-21 (×22): qty 2

## 2018-04-21 NOTE — ED Notes (Signed)
Pt returned to room as directed - Pt returned to room then pulled Code Blue button.

## 2018-04-21 NOTE — ED Notes (Signed)
CBC resulted - no agranulocytosis noted - Order received to increase Clozaril from 100mg  to 150mg  each AM po from T Money, NP, [facility].

## 2018-04-21 NOTE — ED Notes (Signed)
Pt at nurses desk and will not leave staff and objects on desk alone. Ask pt to return to his room multiple times. Pt continues to touch, and try to hug staff. Reminded that he is not allowed to do this. Pt will stop but will try again shortly.

## 2018-04-21 NOTE — ED Notes (Signed)
Pt singing loudly w/another pt.
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
## 2018-04-21 NOTE — ED Notes (Signed)
Service response contacted, lunch tray already ordered for pt

## 2018-04-21 NOTE — ED Notes (Signed)
Pt continuously out of his room - attempting to talk w/staff and other pt's.

## 2018-04-21 NOTE — ED Notes (Signed)
Breakfast Tray Ordered. 

## 2018-04-21 NOTE — ED Notes (Addendum)
Pt becoming more aggressive with staff. Not listening to requests. Slamming the door and wanting to keep it closed. Sitter to bedside and ask pt to keep it open. Pt began to curse and yell. ""get the hell out of here" "I'll do what I want" "I don't like you" "I'll kick your ass". Pt puts his fists up like he wants to fight.  Ask pt to calm down and return to room and to behave. Told pt that we will not tolerate that behavior.

## 2018-04-21 NOTE — ED Notes (Signed)
Pt took brief nap. Pt now awake. Asking for Coke and graham crackers. Advised pt he may have water and Coke and graham crackers will be given when dinner tray arrives. Pt refusing to go back into his room as instructed - attempting to talk and touch w/other staff and pt's. Pt finally returned to room after much encouragement. Pt given water.

## 2018-04-21 NOTE — ED Notes (Signed)
Pt yelling at nurse and threatening. Pt attempted to take items from desk - RN able to remove from pt. Security called to bedside - spoke w/pt. Pt stating "I'm strong. I don't want to fight".

## 2018-04-21 NOTE — ED Notes (Signed)
Pt out in hallway trying to hug people. Directed that he cannot do that. Pt cooperative for the most part. Can be redirected. Asked to return to his room.

## 2018-04-21 NOTE — ED Notes (Addendum)
Pt awake and up to restroom. Pt wet the bed and floor all the way to the restroom. Floors and bed cleaned. Bed remade. Depends put on patient.

## 2018-04-21 NOTE — ED Notes (Signed)
Pt noted to be attempting to touch and threaten Sitter. Pt refuses to follow direction from Sitter. Security called to bedside x 2 this am d/t pt's behavior. Pt follows direction from them and returns to room then ambulates to hallway again. Sitter attempting to remain close w/pt d/t pt is Fall Risk, however, maintaining safe distance for safety.

## 2018-04-21 NOTE — ED Notes (Addendum)
Pt continues to be restless - ambulatory to nurses' desk - refusing to return to room as instructed. Pt took nurses' pen that was lying on desk near nurse then returned it after much encouragement. Pt then took Sitter sign in book from desk and took to his room. Sitter able to retrieve from pt.

## 2018-04-21 NOTE — ED Notes (Signed)
Pt continues to be restless - pt ambulated to nurses' desk and attempted to take nurses' water cup - nurse was able to remove it first. Pt escorted to room.

## 2018-04-21 NOTE — ED Notes (Addendum)
Pt ate lunch. Pt has to be redirected continuously. Pt repeatedly asking same questions and approaching other staff and attempts to talk w/other pt's and fist-bump w/them.

## 2018-04-21 NOTE — ED Notes (Signed)
Pt attempting to touch staff inappropriately even after much encouragement not to do so. Pt also at nurses' desk attempting to make phone call to [facility] - pt also attempting to remove items from desk. Pt encouraged to display appropriate behavior.

## 2018-04-21 NOTE — ED Notes (Signed)
Pt asking same questions repeatedly. Pt noted to continue to be restless - attempting to ambulate back and forth from his room.
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
## 2018-04-22 NOTE — ED Notes (Signed)
Pt attempted to make phone call from nurses' desk. Pt noted to be leaning on counter - stating "I'm going to fall". Pt escorted back to room - stand-by assist. Pt laid down on bed. Advised pt he may attempt call later. Voiced understanding.

## 2018-04-22 NOTE — ED Notes (Signed)
Pt's sister, Mrs [name] "[name]" Mare Ferrari and brother visiting w/pt.

## 2018-04-22 NOTE — ED Notes (Signed)
Pt now awake - noted w/incont of urine. Pt assisted to bathroom - pt removed his paper scrubs in bathroom on his own. Pt given gown so may cover himself to shower room. Pt in shower. Sitter w/pt.

## 2018-04-22 NOTE — ED Notes (Signed)
Pt noted to be restless - ambulating multiple times to bathroom, to nurses' desk then back to room after much encouragement.

## 2018-04-22 NOTE — ED Notes (Signed)
Breakfast tray ordered 

## 2018-04-22 NOTE — ED Notes (Signed)
Pt ambulated to shower and attempted to pull call bell cord. Pt encouraged to display appropriate behavior.

## 2018-04-22 NOTE — ED Notes (Signed)
Pt stated his sister advised him when she was visiting that he is going to another hospital - possibly [facility].

## 2018-04-22 NOTE — ED Notes (Signed)
Pt out to desk asking same questions over and over. Ask pt to return to his room and that I would come see him as soon as I can. Pt asking for graham crackers and drink. Reminded pt of snack time and what time it was. Offered water. "I don't want no water".

## 2018-04-22 NOTE — ED Notes (Addendum)
Pt woke and RN assisted pt to bathroom d/t unsteady gait noted. Pt able to ambulate back to room w/stand-by assist. Pt ate graham crackers given w/Ensure - Pt noted to be asking same questions repeatedly - talking loudly. Encouraging pt to display appropriate behavior. Encouraging pt to sit in recliner rather than ambulating d/t safety.

## 2018-04-22 NOTE — ED Notes (Signed)
Lunch tray ordered at 0957.

## 2018-04-22 NOTE — ED Notes (Signed)
Pt up to restroom. Pt has wet the bed. Scrubs soaked in urine. Pt walks across the floor dripping urine all the way to the restroom. This is a huge fall risk, and pt has already had multiple falls in this admission. Pt has been given urinals and will use them but will not stay in bed. Will use them and bring them out to the hallway and drop them in the trash can. Pt cleaned up and changed into clean scrubs. Bed linens changed and bed cleaned. Helped pt back to bed.

## 2018-04-22 NOTE — ED Notes (Signed)
Breakfast tray at bedside 

## 2018-04-22 NOTE — ED Notes (Signed)
Pt talking w/Malinda on phone at nurses' desk.

## 2018-04-22 NOTE — ED Notes (Addendum)
Pt up and walking to restroom. "I'm wet" Pt wet the bed again. Bed linens changed and pts scrubs changed. Pt asking for graham crackers and drink. Advised pt he knows what time breakfast gets here and that he will have to wait until breakfast. Pt not happy and trying to grab at this RN. Directed to return to his room.

## 2018-04-22 NOTE — ED Notes (Signed)
Pt noted to be restless - ambulates to bathroom, nurses' desk, then back to room as directed. Pt noted to be talking loudly and attempting to touch staff even after much encouragement not to do so. Pt repeatedly asking when breakfast tray will be arriving and asking staff to give him their personal drinks.
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
## 2018-04-22 NOTE — ED Notes (Signed)
Pt attempted to enter another pt's room d/t this RN was caring for this other pt. Pt talked loudly, attempting to interrupt. Pt was directed back to room by [employer]. Pt then pulled Code Blue button in his room. Pt encouraged to display appropriate behavior. Pt continues w/restlessness noted.

## 2018-04-22 NOTE — ED Notes (Addendum)
Pt noted to be verbally threatening staff and attempting to inappropriately male staff. Pt encouraged to display appropriate behavior. Pt laid down on bed and is mumbling loudly. Lights dimmed for comfort.

## 2018-04-22 NOTE — ED Notes (Signed)
Pt dancing and singing.

## 2018-04-22 NOTE — ED Notes (Signed)
Pt continues to come out of room to desk. Restless.  Ask pt to return to room. "I don't want to" "I want to stay out here". Told pt that he would have to return to room. Redirected to room. Pt continues to walk along desk and try to get things off of desk. Instructed pt to leave everything alone.
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
## 2018-04-23 NOTE — ED Triage Notes (Signed)
Pt up to BR and was redirected to his room upon leaving BR.

## 2018-04-23 NOTE — Discharge Planning (Signed)
EDCM following for disposition needs.  

## 2018-04-23 NOTE — ED Triage Notes (Signed)
PT out of room to BR. Pt had to be directed to his room when coming out of BR.

## 2018-04-23 NOTE — ED Notes (Signed)
Patient at desk calling his sister-Monique,RN

## 2018-04-23 NOTE — ED Notes (Signed)
Breakfast Tray ordered  

## 2018-04-23 NOTE — ED Triage Notes (Signed)
Pt out of room to BR. Pt was redirected to his room upon leaving BR.

## 2018-04-23 NOTE — ED Triage Notes (Signed)
Pt in room  Watching TV.

## 2018-04-23 NOTE — ED Notes (Signed)
Pt urinated in his bed.  Pt then got oob incredibly unsteady. W/ assistance from sitter pt safely escorted to restroom.  Sitter assisted pt w/ changing scrubs as well as Armed forces logistics/support/administrative officer while this Probation officer changed linen and sanitized bed. Pt then assisted as gait remained unsteady back to bed.  Will continue to monitor.

## 2018-04-23 NOTE — ED Triage Notes (Signed)
PT out of room walking around in hall . Pt attempted to call his contact person.

## 2018-04-23 NOTE — ED Triage Notes (Signed)
Pt out in hall and was redirected to his room.

## 2018-04-23 NOTE — ED Provider Notes (Signed)
8:42 AM Pt is up ambulating to the bathroom independently. No complaints per nursing staff and patient reports he is feeling "good". Vitals are normal.    Recardo Evangelist, PA-C 04/23/18 5868    Isla Pence, MD 04/23/18 [phone]

## 2018-04-23 NOTE — ED Triage Notes (Signed)
PT out of room to staff desk leaning over desk . Pt redirected to his room.

## 2018-04-23 NOTE — ED Triage Notes (Signed)
Pt out of room to BR . Pt was redirected to his room  Upon leaving BR.

## 2018-04-23 NOTE — ED Triage Notes (Signed)
PT out of room redirected to his room.

## 2018-04-23 NOTE — Progress Notes (Addendum)
10:08am-CSW spoke with APS worker Frederick Peers and was informed that she is waiting to hear from her superior before proceeding with this case. CSW made aware that Edwyna Ready has spoken wit Rowanna and he will follow up with CSW as needed.   CSW still following for further needs. CSW has received calls from ED Leadership requesting updates on pt's placement. CSW advised them that CSW has been calling pt's Care Coordinator Elta Guadeloupe  with [place] as well as spoke with pt's Legal guardian about placement. CSW was advised by both parties that as of Thursday on last week, no placement had been found for pt. CSW advised both parties that CSW understood their concerns however CSW has been made aware numerous of times that pt is ready to be picked up and would need to be picked up as pt can not stay in the ED.    CSW received a call from [place] with the Division of Health and Human Services expressing that pt's leagl guardian called her regarding the care that pt has been getting the hospital. Westley Hummer asked if she should be speaking with CSW or someone higher higher. CSW advised Rowanna that CSW in the ED is doing as directed by Social Work Leadership and suggested that she speak with Nathaniel Man about this. CSW provided Rowanna with contact information  for Zack.    CSW also reached out to [employer] with [facility] APS and was informed that the assigned worker for this case is Frederick Peers [phone]  is pt's assigned case worker. CSW called on 04/19/2018 and left messages for both [facility] and her supervisor Darryl asking that they call CSW back to discuss the plan for pt- CSW did not received call back. As of 04/23/2018 CSW still has not heard from [facility] or Darrly [phone]. CSW did reach out to [facility] again this morning and left message asking that she call CSW back. CSW also reached out to [employer] for updates since speaking with Rowanna. CSW awaiting call back from all parties that have been called or text at this  time.      Virgie Dad Neshawn Aird, MSW, [place] [facility] Clinical Social Worker [phone]
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
## 2018-04-24 MED ORDER — VALBENAZINE TOSYLATE 80 MG PO CAPS: 80.0000 mg | ORAL_CAPSULE | Freq: Every day | ORAL | 0 refills | Status: DC

## 2018-04-24 NOTE — ED Notes (Signed)
Pt remains restless - asking for dinner tray repeatedly. Sitter playing music for pt to assist w/distracting.

## 2018-04-24 NOTE — ED Notes (Signed)
Pt asking for food, returned to room after much encouragement.

## 2018-04-24 NOTE — ED Notes (Signed)
Pt attempting to touch staff inappropriately and requires redirection to his room frequently.

## 2018-04-24 NOTE — ED Notes (Signed)
Pt attempted to touch staff inappropriately, encouraged display of appropriate behavior

## 2018-04-24 NOTE — Care Management (Signed)
EDCM consulted to assist with obtaining Rx provided by family Museum/gallery conservator).  [facility] requested order from EDP to send to [facility] to secure and package for dispensing.  No further EDCM needs identified at this time.

## 2018-04-24 NOTE — ED Notes (Signed)
Regular Diet was ordered for Lunch. 

## 2018-04-24 NOTE — Care Management (Signed)
ED CM contacted Rosina Lowenstein [phone] as per legal guardian.  Spoke with [name] and was informed that the medication was ordered by patient's Psychiatrist and sent to the specialty pharmacy and should should be ready for pick up tomorrow or Thursday and she will deliver it to the ED.

## 2018-04-24 NOTE — Care Management (Signed)
EDCM received message from Richardson Landry in [place] that Rx would be $1300 charge to system.  EDCM will speak with ED Director and CM Director for direction.

## 2018-04-24 NOTE — Discharge Planning (Signed)
EDCM reached out to py sister and group home for assistance with obtaining Rx.

## 2018-04-24 NOTE — ED Notes (Signed)
Pt singing and dancing.

## 2018-04-24 NOTE — ED Notes (Signed)
Pt continuously leaving room & trying to touch people in passing & hugging even after told not to.

## 2018-04-24 NOTE — Progress Notes (Signed)
CSW received a call from [place] who was returning a call from the 1st shift ED CSW regarding the pt's case at ph:  [phone].  Miss Kristine Linea was instructed by her supervisor Mr. Laurann Montana at ph: [phone] to call the CSW and to follow up.  CSW called and left a HIPPA-compliant VM suggesting that the DSS worker call the 1st shift ED CSW on 3/4 to follow up regarding the pt's case.  2nd shift ED CSW will leave handoff for 1st shift ED CSW.  CSW will continue to follow for D/C needs.  Alphonse Guild. Renell Allum, LCSW, LCAS, CSI Clinical Social Worker Ph: [phone]

## 2018-04-24 NOTE — ED Notes (Signed)
Pt ambulate to & from restroom.

## 2018-04-24 NOTE — ED Notes (Signed)
Pt's legal guardian called to check on patient; RN once again asked if she would be picking up patient and she advised "No, I was just calling to check on him" RN advised that patient is doing fine and if there is any emergent situation staff would call her otherwise patient is ok at this time; Once again legal guardian is calling during shift change regarding patient's status and has been asked not to  several times via prior RN; No further information is needed to share at this time regarding patient's care; Pt is currently sleeping at this time-Monique,RN

## 2018-04-24 NOTE — ED Notes (Signed)
Pt attempted to touch Sitter inappropriately as he was ambulating to bathroom. Encouraged pt to display appropriate behavior. Pt went to room, laid down on bed for brief period then ambulated to desk to use phone to call Mrs [name]. Pt continues w/restlessness.

## 2018-04-24 NOTE — ED Notes (Signed)
Austin Gates, [place] [facility], advised of her supervisor's recommendations - Requesting for Operating Room Services, [place] to have "consultative conference" to discuss plan - Documented recommendation of [place] Precautions and Frequent urination to be medically assessed. Also, advised will assess to see if pt can be admitted to one of their crisis beds. States pt may remain there up to 30 days while placement is being sought. Advised may call [place] [phone] - for any issues 24/7. Junie Panning, SW, aware.

## 2018-04-24 NOTE — ED Notes (Signed)
RN informed Pt's RN that [place] [facility] Rep was here to see Pt. RN informed to send Rep to Rm

## 2018-04-24 NOTE — ED Notes (Signed)
Patient was given a Cookies and Drink.

## 2018-04-24 NOTE — ED Notes (Signed)
Pt noted to be restless, repetitive speech, and talking loudly. Pt attempting to ambulate to nurses' desk multiple times - re-directed to return to room by staff.

## 2018-04-24 NOTE — Progress Notes (Signed)
CSW spoke with Olivia Mackie with  START regarding patients current status at [place] answered questions and referred to RN taking care of patient to answer medication questions.   Kingsley Spittle, LCSW Clinical Social Worker  [facility]  [phone]

## 2018-04-24 NOTE — ED Notes (Signed)
[place] [facility] representative is with pt at this time.

## 2018-04-25 NOTE — Care Management (Addendum)
Received call from Rosina Lowenstein the medication will not be ready until Friday 3/6, she is also requesting an new FL2 be completed, CM will leave a handoff for the daytime ED CSW to follow up. Updated Sare RN on [place] as well.
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
## 2018-04-25 NOTE — ED Notes (Signed)
Patient walking around unit, acting like he is going to fall, then sitting down on the ground and laying down. Pt redirected to room and settled in bed where he is now resting.
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
## 2018-04-26 NOTE — ED Notes (Signed)
MD Ralene Bathe made aware of pt's HR was 116 upon checking his vitals. Pt denies pain, ambulatory in room. No new orders at this time, will continue to monitor.

## 2018-04-26 NOTE — ED Notes (Signed)
Pt out to desk. Multiple questions over and over. Pt wanting to hug and "fist bump". Ask pt politely to return to room. Pt asking for snack. Advised pt that he knew when snack time was and that he would get something at 2100.

## 2018-04-26 NOTE — ED Notes (Signed)
Lunch ordered 

## 2018-04-26 NOTE — ED Notes (Signed)
Message sent to pharmacy to send missing meds.

## 2018-04-26 NOTE — ED Notes (Signed)
Sitter taking patient for a walk around the unit

## 2018-04-26 NOTE — ED Notes (Signed)
Breakfast Tray Ordered. 

## 2018-04-26 NOTE — ED Notes (Signed)
Pt showered, accompanied by staff to prevent falls. Security will come with sitter to walk pt around unit to get some fresh air and hopefully reduce some agitation.

## 2018-04-26 NOTE — ED Notes (Signed)
2 visitors bedside at this time

## 2018-04-26 NOTE — ED Notes (Signed)
Pt wandering in and out of his room. Not wanting to stay in his room. Pt has hands down his pants. Told pt to take his hands out of his pants and go wash them. Pt never washes hands after using the restroom. Pt up at nurses station trying to touch everything on desk. Ask pt to return to his room and leave everything alone. Pt returned to his room, but in room yelling to staff to come in there. Told pt to stop yelling and watch TV or he would not get his cookies. Pt cooperative after.

## 2018-04-26 NOTE — ED Notes (Signed)
Pt refusing to take his po medications. Ask pt again and he still refuses. Told pt if he took his medications, he could have another pack of graham crackers. Pt sat up in bed and took his medications without problem.

## 2018-04-26 NOTE — ED Notes (Signed)
Pt out to desk "I'm wet". Directed pt to restroom to change. New, clean scrubs given to patient and bed changed and cleaned. Depends placed on pt at this time. Pt back to bed.

## 2018-04-26 NOTE — Progress Notes (Signed)
CSW received call from pt's [place] of updates regarding conference call on tomorrow. CSW was advised that a Conference Call would be taking place on 04/27/2018 to establish further plans in pt's care. CSW was advised by Elta Guadeloupe that he, Jacolyn Reedy (hsi supervisor), Elwanda [place] (her supervisor), Monico Hoar (pt's psychiatrist), Augusto Gamble ( [facility] Supervisor), Maggie (Director of [place] [facility]), Barrie Dunker, and Ned Card from [facility] would all be present on the call. CSW has updated Nathaniel Man and he has requested that [place] RNCM be on call as well. Number to call in for Conference Call  is [phone].     Virgie Dad Myrick Mcnairy, MSW, [place] [facility] Clinical Social Worker [phone]
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
## 2018-04-27 NOTE — ED Notes (Signed)
Lunch ordered 

## 2018-04-27 NOTE — Progress Notes (Signed)
Care Coordination Assistant Director and Dr. Dwyane Dee participated in an interagency call conference that included representatives from [place], [facility], [place] [facility] and [facility].   [place] [facility] indicates their Medical Director will be back on Monday to review Austin Gates appropriateness for their 30 day '[place].' [place] [facility]'s chief concern is the management of the patient's behavior. They also would like to have a group home in place prior to accepting the patient into their program. [place] [facility] intends to reach out to the Assistant Director of Care Coordination on Monday for collateral information regarding the patient's medication.  Elana Alm, MSW, Location manager Health [phone]

## 2018-04-27 NOTE — Progress Notes (Signed)
Received call from Rosina Lowenstein from [place] # [phone], states Valbenazine Tosylate did not come in from pharmacy. Home meds has to be supplied from Pt/Family. States she will update NCM when she receives medication. Jonnie Finner RN CCM Case Mgmt phone [phone]

## 2018-04-27 NOTE — ED Notes (Signed)
Pt up to desk and walking around hallways. Cooperative at this time.
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
## 2018-04-27 NOTE — ED Notes (Signed)
Breakfast tray ordered 

## 2018-04-27 NOTE — ED Notes (Addendum)
Pt up to restroom with his hands down his pants. "I'm wet". Pt given new scrubs and depends placed on pt. Bed not wet at this time. Chux to bed. Pt cooperative at this time. After changed, pt went back to bed.

## 2018-04-27 NOTE — ED Notes (Signed)
Pt at desk, asking questions over and over again. Asking for graham crackers and cookies. Reminded that snack time isn't until morning and that breakfast will be here around 0800. Asked pt to return to his room. Pt drooling all over desk.

## 2018-04-27 NOTE — ED Notes (Signed)
Pt becoming agitated.

## 2018-04-28 NOTE — ED Notes (Signed)
Patient called his sister-Monique,RN

## 2018-04-28 NOTE — ED Notes (Signed)
Pt noted w/excessive talking loudly and laughing, and restlessness.

## 2018-04-28 NOTE — ED Notes (Signed)
Austin Gates, to send meds requested - Depakote, Ensure, and Valbenazine.

## 2018-04-28 NOTE — Care Management (Signed)
Contacted Rosina Lowenstein [phone] concerning the status of patient's medication delivery. CM was informed that the medication has not arrived yet via mail order awaiting delivery by [facility] today, will update [place] when it arrives. CM made her aware that patient's medication has ran out, states she will bring meds to ED as soon as they arrive. Updated Becky RN on [place]. CM will continue to follow.

## 2018-04-28 NOTE — ED Notes (Signed)
Pt continues w/excessive and loud talking. Pt is easily re-directable thus far.

## 2018-04-28 NOTE — ED Notes (Signed)
Patient was eating meat on meal tray and got choked; Patient able to cough up food and spit in the trash can; pt has been asked to not eat meat but eat soft items on tray-Monique,RN
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
## 2018-04-28 NOTE — ED Notes (Signed)
Pharmacy advised pt is out of home med - [place]. Mariann Laster, CM, aware and assisting.

## 2018-04-28 NOTE — ED Notes (Addendum)
Pt noted to be restless - repetitive talking, talking loudly at times. Sitter w/pt. Pt ambulates to bathroom multiple times. Sitter re-directs pt to room d/t pt attempts to ambulate to nurses' desk. Pt also continues to attempt to touch and hug staff inappropriately. Pt encouraged to display appropriate behavior. Pt noted to be compliant at this time.

## 2018-04-28 NOTE — ED Provider Notes (Signed)
No acute events overnight.  Patient pleasant, walking around the room without complaints.  Disposition pending placement. SW helping with dispo planning.    Ward, Ozella Almond, PA-C 04/28/18 8403    Margette Fast, MD 04/28/18 [phone]
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
## 2018-04-29 MED ORDER — TRIHEXYPHENIDYL HCL 2 MG PO TABS
1.0000 mg | ORAL_TABLET | Freq: Two times a day (BID) | ORAL | Status: AC
  Administered 2018-04-29 – 2018-04-30 (×4): 1 mg via ORAL
  Filled 2018-04-29 (×5): qty 1

## 2018-04-29 NOTE — ED Notes (Signed)
Pt on phone w/Malinda.

## 2018-04-29 NOTE — Care Management (Signed)
ED CM attempted to contact caregiver Chase Blas today concerning patient's medications status no answer. ED CM noted [employer] in [place] contacted the office for a return call on the status of patient's medication. CM will handoff to daytime CM.

## 2018-04-29 NOTE — ED Provider Notes (Signed)
Patient seen this morning on psychiatric rounds.  He has no complaints.  He was pleasant and calm.  Per nursing staff and pharmacy, but he is out of his home [place].  This medication is not stocked at our pharmacy and therefore, his home meds were being distributed.  He is now out of them.  Per case management, his mail order should arrive today or tomorrow.  Discussed this predicament with our ER pharmacist who recommends substituting trihexphyenidyl 1mg  BID until his home meds can be brought to ER.  I have put this in for the next 2 days.  Should be discontinued if his [name] comes in sooner.  Jacqlyn Larsen, RN aware of plan. Plan still awaiting placement / discharge plan/    Austin Gates, Ozella Almond, PA-C 04/29/18 6659    Duffy Bruce, MD 04/29/18 [phone]

## 2018-04-29 NOTE — ED Notes (Signed)
Pt sitting in chair in room - continuously talking loudly - calling out to all staff he can see. While Sitter is w/pt, pt will stay in room as directed.

## 2018-04-29 NOTE — ED Notes (Addendum)
Pt given meds d/t pt noted to be restless and refusing to follow directions from staff. Pt continues w/excessive loud talking and refusing to stay in room. Pt attempting to touch staff inappropriately. Encouraging pt to display appropriate behavior.

## 2018-04-29 NOTE — ED Notes (Signed)
Pt dancing.

## 2018-04-29 NOTE — ED Notes (Signed)
Pt unable to eat beef tips for dinner d/t no teeth and aspiration precautions. 2nd tray ordered for pt. Pt voiced understanding.

## 2018-04-30 NOTE — ED Notes (Addendum)
Patient was taken for walk around ED with sitter and security present-Monique,RN

## 2018-04-30 NOTE — Progress Notes (Signed)
CSW aware that Surveyor, quantity Zack participated in Conference call on Friday with [place] [facility], [place], and other parties involved in pt's care at this time. Per note placed in pt's chart on 04/27/2018 by Zack "[place] [facility] indicates their Medical Director will be back on Monday to review Mr. Juhasz appropriateness for their 30 day '[place].' [place] [facility]'s chief concern is the management of the patient's behavior. They also would like to have a group home in place prior to accepting the patient into their program.  [facility] intends to reach out to the Assistant Director of Care Coordination on Monday for collateral information regarding the patient's medication".  CSW will remain involved to provide information as needed.    Virgie Dad Detavious Rinn, MSW, [place] [facility] Clinical Social Worker [phone]

## 2018-04-30 NOTE — ED Notes (Signed)
Dinner tray ordered for pt

## 2018-04-30 NOTE — ED Notes (Signed)
Pt st's he wants to drink it at dinner time.

## 2018-04-30 NOTE — ED Notes (Signed)
Wes, RN placed breakfast-tray order.

## 2018-04-30 NOTE — ED Provider Notes (Signed)
Notes, labs, and vitals reviewed.   Pt pending placement. Per notes. [place] [facility] medical director to review pt's appropriateness for their services today. Pt is now out of his home [place], which we do not have on stock. Substitute ordered per pharm recs and will be give until pt's refill of medication can be brought to the ED.  Pt remains slightly tachycardic, although improved from previous.    Franchot Heidelberg, PA-C 04/30/18 0981    Jola Schmidt, MD 04/30/18 [phone]

## 2018-05-01 NOTE — ED Notes (Signed)
Patient was given a snack and Drink. A Regular Diet was ordered for Lunch. 

## 2018-05-01 NOTE — Progress Notes (Signed)
CSW left voicemail for patients care coordinator, Elta Guadeloupe, for update on disposition plans. Voicemail left for return call.   Kingsley Spittle, [place]  [phone]

## 2018-05-01 NOTE — ED Notes (Signed)
Pt eating lunch tray  

## 2018-05-01 NOTE — ED Notes (Signed)
Pt had shower and eating breakfast

## 2018-05-01 NOTE — ED Notes (Signed)
Patient at desk making phone call to legal guardian-Monique,RN

## 2018-05-01 NOTE — ED Notes (Signed)
Pt requested to go on walk. RN asked Maylon Cos if it was possible for him to get outside. He went on walk with security, Maylon Cos, and sitter Opal Sidles, and was able to get outside for fresh air in ambulance bay. He seemed really happy when he returned.

## 2018-05-01 NOTE — ED Notes (Signed)
Pt has not slept all night and complaining of leg pain. PRN and scheduled meds given. Pt redirected back to bed and encouraged to rest. Sitter at bedside.

## 2018-05-01 NOTE — ED Notes (Signed)
Pt is finally asleep

## 2018-05-01 NOTE — ED Notes (Signed)
Pt sitting up in chair watching TV- still has not resting for much time.

## 2018-05-01 NOTE — ED Notes (Signed)
Patient is restless and has been up and down to the bathroom for a couple of hours; pt asked for PRN to help him sleep-Monique,RN

## 2018-05-01 NOTE — ED Notes (Signed)
Patient took nightly walk with security and sitter-Monique,RN

## 2018-05-02 NOTE — ED Notes (Signed)
Pt ambulatory to desk to use phone at this time.

## 2018-05-02 NOTE — ED Notes (Signed)
Patient's family visiting

## 2018-05-02 NOTE — ED Notes (Signed)
Patient has increase agitation due to the behavior of another patient in unit; Patient could not be brought back down to normal behavior; Pt is afraid for staff safety from other patient in unit; pt is redirected and ensured staff was ok; PRN meds given for agitation-Monique,RN

## 2018-05-02 NOTE — ED Notes (Signed)
Pt received lunch tray 

## 2018-05-02 NOTE — ED Notes (Signed)
Patient taken for walk with Security and sitter through the ED-Monique,RN

## 2018-05-02 NOTE — ED Notes (Signed)
Lunch tray ordered 

## 2018-05-03 NOTE — ED Notes (Signed)
Pt's legal guardian called to check on pt.

## 2018-05-03 NOTE — ED Notes (Signed)
Pt continues to be very restless, talking loudly and calling out to nurse continuously. Sitters attempt to redirect pt - pt not doing as instructed - requiring RN to instruct pt. Pt noted be fall risk d/t bending over when ambulating and holding on to hand rail in hall. Jasper Loser, PA, assessed pt this am.

## 2018-05-03 NOTE — Progress Notes (Signed)
CSW spoke with pt's Care Coordinator [place] with [place]. CSW was advised that at this time neither [facility] or Care Coordinator has a new placement for pt. CSW was advised that [place] [facility] will no longer be able to take pt as they feel that his needs are to great. CSW attempted to make contact with pt's Legal guardinan-left message asking for call back at this time.    CSW spoke with Nathaniel Man in Leadership and was informed that he remains in contact with [facility] and APS worker.     Virgie Dad Embrie Mikkelsen, MSW, [place] [facility] Clinical Social Worker [phone]

## 2018-05-03 NOTE — ED Notes (Signed)
Patient has continued to be restless and agitated. PRN meds administered(see mar)

## 2018-05-03 NOTE — ED Notes (Signed)
Patient appears to be slouching while walking this morning. He reports that his right hip hurts. ED notified. EDP at bedside

## 2018-05-03 NOTE — ED Notes (Signed)
meds given at 9am with 2nd RN(Becky) witnessed

## 2018-05-03 NOTE — ED Notes (Signed)
Pt woke and has soiled himself. Pt in shower.

## 2018-05-03 NOTE — ED Notes (Signed)
Lunch tray ordered 

## 2018-05-04 LAB — URINALYSIS, ROUTINE W REFLEX MICROSCOPIC
Bilirubin Urine: NEGATIVE
Glucose, UA: NEGATIVE mg/dL
Hgb urine dipstick: NEGATIVE
Ketones, ur: 5 mg/dL — AB
LEUKOCYTE UA: NEGATIVE
Nitrite: NEGATIVE
Protein, ur: NEGATIVE mg/dL
Specific Gravity, Urine: 1.016 (ref 1.005–1.030)
pH: 6 (ref 5.0–8.0)

## 2018-05-04 NOTE — ED Notes (Signed)
Pt constantly up at desk. Asking repetitive questions. Pt still drooling at times. Pt back and forth to restroom.

## 2018-05-04 NOTE — ED Notes (Signed)
Pt crying. States that his back has been hurting. Points to bilat flank area. Sitter states that his urine has a foul odor. Will send a urine for UA.

## 2018-05-04 NOTE — ED Notes (Signed)
Pt up and states "I'm wet again' "the bed is wet too". Pt to restroom. New scrubs and new depends on patient. Bed linens changed. Pt up watching TV at this time.
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
## 2018-05-04 NOTE — ED Notes (Signed)
Sitter pulled to go to 4N14. Pt cooperative at this time. Depends applied for bedtime.

## 2018-05-04 NOTE — ED Notes (Signed)
Breakfast tray ordered 

## 2018-05-04 NOTE — ED Notes (Signed)
Depends applied to pt. Pt usually wets the bed at least twice a night and walks from room, dripping urine all over the floor. Depends on to minimize urine leaking and reduce fall risk.

## 2018-05-04 NOTE — ED Notes (Signed)
Snack provided to pt

## 2018-05-04 NOTE — ED Notes (Addendum)
Pt up to desk while this RN is on the phone. Asked pt to return to his room. Pt walking bent over more than usual. Denies pain. According to notes, L. Percell Miller PA saw pt yesterday 05/03/18 during computer downtime. Cannot find note. Will speak with md about possible xray or PT consult or if this maybe from pt not receiving his Velbenzine x 2 days. (Subsitute was given Saturday, Sunday and Monday. Was not given med Tuesday and Wednesday per Jacqlyn Larsen, Therapist, sports).

## 2018-05-04 NOTE — ED Notes (Signed)
Pt constantly up to desk wanting to bump fists. Trying to grab objects off desk. Asked pt to return to room. Cooperative. When he returns to room, he yells out "Nurse, Nurse, come here". Told pt that this RN will come to see him when she can. No distress noted. Sitter at bedside. Pt back and forth to restroom. Pt walking bent over more than normal. Will continue to monitor.

## 2018-05-04 NOTE — ED Notes (Signed)
Pt up to restroom. Has depends on but still wet his bed. "I'm wet". Pts bed cleaned and new sheets applied. New depends and scrubs with socks on pt. Pt back to bed.

## 2018-05-04 NOTE — Progress Notes (Signed)
CSW received a call from pt's RN at [phone] stating the pt's legal guardian had called and was available to talk if social work needs to reach her.  CSW will continue to follow for D/C needs.  Alphonse Guild. Izen Petz, LCSW, LCAS, CSI Clinical Social Worker Ph: [phone]

## 2018-05-05 NOTE — ED Notes (Signed)
Pt getting agitated and irritable and verbally aggressive with sitter. Medication given, see MAR.

## 2018-05-05 NOTE — ED Notes (Signed)
Breakfast Tray ordered  

## 2018-05-05 NOTE — ED Notes (Signed)
Regular Diet was ordered for Lunch. Catheryn Bacon and Coke was given during snack time.

## 2018-05-05 NOTE — ED Notes (Signed)
Pt out to desk. "I'm wet", "my bed is wet". Pt soaked through depends for the second time tonight. New scrubs and new linens on bed.

## 2018-05-05 NOTE — ED Notes (Signed)
Pt out to desk, "I'm wet" "Bed is wet too". Pt to restroom, cleaned up, new depends, new scrubs on. Bed changed and cleaned. Pt back to bed.

## 2018-05-05 NOTE — Progress Notes (Signed)
CSW staffed case with CSW supervisor Georga Kaufmann. Per supervisor they are going to collow up with [facility] to check in status of their disposition on finding appropriate placement for patient.  Lamonte Richer, LCSW, [place] Work[facility] [place] [phone]

## 2018-05-06 NOTE — ED Provider Notes (Signed)
Austin Gates 58 year old male is been seen during psychiatric rounding this morning.  CMP nonacute Salicylate level negative Acetaminophen level negative Ethanol level negative UDS was positive for benzos CBC unremarkable BMP unremarkable TSH within normal limits Valproic acid 62 Urinalysis unremarkable  Vital signs obtained at 7 AM this morning show tachycardia of 117, this appears to be baseline for the past month.  Patient appears to be asymptomatic regarding this.  He has been evaluated in the hallway today.  He is ambulating around the hallway and in no acute distress.  He is calm/cooperative and with no complaints.  Pending placement at this time.  Note: Portions of this report may have been transcribed using voice recognition software. Every effort was made to ensure accuracy; however, inadvertent computerized transcription errors may still be present.    [name] 05/06/18 0915    Julianne Rice, MD 05/06/18 [phone]

## 2018-05-06 NOTE — ED Notes (Addendum)
Currently waiting on a sitter. Pt being monitored by team.

## 2018-05-06 NOTE — ED Notes (Signed)
Breakfast Tray Ordered. 

## 2018-05-06 NOTE — ED Notes (Signed)
Currently sleeping, will administer 1000 meds upon awakening.

## 2018-05-06 NOTE — ED Notes (Signed)
Pt taken around the ED for daily walk; pt put in wheelchair by request; Security and Actuary with [employer]

## 2018-05-06 NOTE — ED Notes (Addendum)
Pt noted to be restless and continuously talking and approaching staff and anyone else who he can view from his room. Pt ambulates to nurses desk multiple times - pt returns to room as directed.

## 2018-05-06 NOTE — ED Notes (Signed)
Pt getting agitated and irritable with sitter. Medication given, see MAR.

## 2018-05-07 NOTE — ED Notes (Signed)
Dinner tray ordered.

## 2018-05-07 NOTE — ED Notes (Signed)
Pt walks in unit and sing "Ice, Ice , Baby" very redirectible.
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
## 2018-05-07 NOTE — Progress Notes (Signed)
CSW spoke with pt's legal guardian [name]. [name] expressed that she did have a conference call with [facility] and they informed her that they have not been able to find a placement for pt at this time. Per [name] "all the facilities we find they say he needs inpt hospitalization". CSW acknowledge [name]'s concerns but expressed that the hospital is not suggesting that and hasn't been suggesting that at this time. [name] expressed that [facility] also thinks that pt is better services at a hospital than in their program. CSW expressed understanding to [facility] and suggested that she follow up with Daniel to see what options he has in placing pt. [name] expressed "im just not feeling confident with anyone at this time". [name] desires to come and visit with pt and speak with Nursing Staff. [name] not able to tell CSW what time she would be at the ED to see pt but did expressed that she would be coming and desired to speak with someone about "how is pt considered stable when he is being kept on the locked ward and still get medications". CSW Kreg Shropshire to speak with staff as CSW is not determine why pt is still getting medications.   Virgie Dad Horst Ostermiller, MSW, [place] [facility] Clinical Social Worker [phone]

## 2018-05-07 NOTE — ED Notes (Signed)
Pty eating breakfast and tolerating well.

## 2018-05-07 NOTE — ED Notes (Signed)
Family at bedside and discussing disposition with PA and CSW.

## 2018-05-07 NOTE — ED Notes (Signed)
Pt has made multiple attempts to contact his guardian. No success.

## 2018-05-07 NOTE — Progress Notes (Signed)
CSW aware that pt's sister and brother in law asked to speak with CSW. CSW met with sister [name] and her husband. SCW was advised that [name] has been unhappy with the help hat she has been getting from [facility]. Per Brother in law, he feels that [facility] is telling them what to do instead of asking them what they would like. CSW advised [name] and brother in law that at this time hospital staff is still suggesting that pt is cleared by psych and no longer need hospital services. [name] appeared to be understanding of that but still sough information on how pt was cleared. CSW advised [name] that CSW doesn't see and determine when pt's are cleared and not cleared. Brother in law appeared very upset as he feels that [facility] and [facility] have been undermining what [name] has to say.   CSW made it clear that inpt hospitalization is not and option for pt at this time and both [name] and brother in ;aw mentioned that this is what they would like but they understood that previously therefor they had been working with [facility] to try and get pt back to the old placement [facility]) or to another once. [name] expressed that she spoke with Robin from the [facility] a little bit ago and was informed that if pt's medications were adjusted to the right amount then they would take pt back at the group home. [name] expressed that she would follow back up with Robin to see if this is still the case and if not then she would reach back out to [place] with [place] to see if they have any other suggestions/placment options. CSW expressed to [facility] that she would follow back up with her later in the week to see where pt will be going. [name] understanding where as brother in law expressed no desire to work with [facility] any longer.   PLAN: CSW will follow back up with [name] later in the  week to see what placment she has chosen for pt to be discharged to.     Virgie Dad Alabama Doig, MSW,  [place] [facility] Clinical Social Worker [phone]

## 2018-05-07 NOTE — ED Notes (Signed)
Pt goes for walk in [facility] with sitter and security. Oked by charge nmurse

## 2018-05-07 NOTE — ED Notes (Signed)
Patient taken on walk with sitter-Monique,RN

## 2018-05-07 NOTE — ED Notes (Addendum)
Millie - RN aware of pt's pulse rate.

## 2018-05-08 NOTE — ED Notes (Signed)
Austin Gates, SW, contacted pt's Legal Guardian, Austin Gates, who advised she spoke w/Austin Gates from pt's group home. She had requested for [facility] SW to contact her. Austin Gates, SW, contacted [facility], who advised "we were only waiting for his medication to be adjusted then we would take him back". This RN assisted Austin Gates in [place] pt's meds have been adjusted. Austin requested for FL-2 to be completed and for pt's home med - [place] - to be returned w/pt. SW completed FL-2 and RN assured Austin Gates will send pt's home med w/him. Per Austin Gates advised someone will pick him up this afternoon from group home.

## 2018-05-08 NOTE — ED Notes (Signed)
Pt noted to be singing and dancing w/staff - stating how happy he is to go back to group home. Pt now on phone talking w/Mrs Mare Ferrari.

## 2018-05-08 NOTE — Progress Notes (Addendum)
8:40am-CSW received call from [facility] and was informed that someone would be to picked up today once medication has been filled.   CSW spoke with legal guardian this morning to follow backup with conversation that was held on yesterday. CSW was made aware that [place] who is legal guardian spoke with [facility] Blas from pt's previous group home and was informed that they are agreeable to take pt back if pt's medications had been adjusted. CSW spoke with RN and was informed that pt's medication were adjusted and that pt is getting medications morning, afternoon, and evening at this time. CSW updated Shirlean Mylar of this and sent over [facility] as well as medication log to Shirlean Mylar to allow her to review it with her leadership. Shirlean Mylar to call CSW back or CSW will follow up with Robin around noon in order to confirm a plan in taking pt back today. CSW will follow as needed.     Virgie Dad Nyeisha Goodall, MSW, [place] [facility] Clinical Social Worker [phone]

## 2018-05-08 NOTE — ED Notes (Signed)
Pt danced to music / videos. States he is ready to go back to the group home.

## 2018-05-08 NOTE — TOC Transition Note (Signed)
Transition of Care [facility]) - CM/SW Discharge Note   Patient Details  Name: Austin Gates MRN: [id number] Date of Birth: [birth year]  Transition of Care [facility]) CM/SW Contact:  Fuller Mandril, RN Phone Number: 05/08/2018, 11:28 AM   Clinical Narrative:     [facility] consulted regarding obtaining Rx to go home to cover until monthly Rx delivered 05/09/2018  Final next level of care: Group Home Barriers to Discharge: Barriers Resolved   Patient Goals and CMS Choice Patient states their goals for this hospitalization and ongoing recovery are:: "go home and go outside"      Discharge Placement                 [facility]      Discharge Plan and Services               [facility] spoke with Bailey Mech of Group home who states Rx will be delivered tomorrow and as a result pt will not receive nightly Rx if discharged today.    EDCM and RN constructed plan to give pt nightly Rx prior to discharge today.  Bailey Mech called back stating Rx will be delivered today and she can pick up pt between 5:00 and 7:00 today.         Social Determinants of Health (SDOH) Interventions     Readmission Risk Interventions No flowsheet data found.
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
## 2018-05-08 NOTE — ED Notes (Signed)
ALL belongings - 2 labeled belongings bags and home med - given to [place], [place] caregiver w/discharge instructions.

## 2018-05-08 NOTE — ED Notes (Signed)
Pt ambulated to bathroom so he could see how he looks after being shaved - Pt noted to be smiling and stating "I look good, don't I?" Pt returned to room as instructed.
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
## 2018-05-08 NOTE — ED Notes (Signed)
Haldol given d/t pt noted to overly excited and talking loudly. Pt redirectable, however, behavior continues.

## 2018-05-08 NOTE — ED Notes (Signed)
[facility] Blas, from group home, called and advised her pharmacy is unable to obtain a couple of pt's meds and asked if we could provide 1-2 days' worth of the med. Referred her to CM.

## 2018-05-08 NOTE — ED Notes (Addendum)
Pt's Legal Guardian, Mrs Lauris Poag, called and advised she is aware and is in agreement w/pt returning to group home. Aware they have planned to pick him up between 1700-1900. Pt talking w/her on phone.

## 2018-05-08 NOTE — ED Notes (Signed)
NT shaving pt as pt requested.

## 2018-05-08 NOTE — ED Notes (Signed)
Regular Diet was ordered for Dinner. 

## 2018-05-08 NOTE — ED Notes (Signed)
Patient was given a snack and drink. A Regular Diet was ordered for Lunch. 

## 2018-05-08 NOTE — ED Notes (Addendum)
Pt given Ativan d/t pt noted to be hyperactive d/t will be leaving later this evening.
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
## 2018-05-08 NOTE — ED Notes (Addendum)
[name], called back and advised her pharmacy called and advised they will be able to get Austin Gates's meds this evening. Requested for Austin Gates to be given 1700 dose of Clozaril and advised she has arranged for Austin Gates to be picked up between 1700-1900. CM aware. Austin Gates aware and appears to be happy and excited.

## 2018-07-05 ENCOUNTER — Non-Acute Institutional Stay: Payer: Medicaid Other | Admitting: Adult Health Nurse Practitioner

## 2018-07-05 ENCOUNTER — Other Ambulatory Visit: Payer: Self-pay

## 2018-07-05 DIAGNOSIS — Z515 Encounter for palliative care: Secondary | ICD-10-CM
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
## 2018-07-09 ENCOUNTER — Telehealth: Payer: Self-pay | Admitting: Adult Health Nurse Practitioner

## 2018-07-09 NOTE — Telephone Encounter (Signed)
Austin Gates has a left a message.  I called back and spoke with her.  Added this addendum to his last visit with our discussion and forwarded to PCP.   Addendum: Austin Gates, called provider on 07/09/2018 about concerns for patient having more choking episodes and not swallowing well.  Have discussed that this will happen since he is at risk for aspiration. States that he was started on Ingrezza for tardive dyskinesia. She also stated that he has been drooling more. May need adjustment to his clozapine as this is a side effect of the medication. If not being followed by a psychiatrist in the community would recommend one to help balance behaviors and side effects with his medications.  She also wanted to make sure he was still listed as a full code and confirmed this in his chart.  Austin Gates K. Olena Heckle NP

## 2018-07-13 ENCOUNTER — Telehealth: Payer: Self-pay

## 2018-07-13 NOTE — Telephone Encounter (Signed)
At the request of Amy NP, phone call placed to guardian to follow up regarding request to PCP for Speech Therapy. VM left.

## 2018-07-18 ENCOUNTER — Encounter: Payer: Self-pay | Admitting: *Deleted

## 2018-07-18 ENCOUNTER — Other Ambulatory Visit: Payer: Self-pay

## 2018-07-18 ENCOUNTER — Emergency Department
Admission: EM | Admit: 2018-07-18 | Discharge: 2018-07-19 | Disposition: A | Discharge status: Home / Self Care | Payer: Medicaid Other | Attending: Emergency Medicine | Admitting: Emergency Medicine

## 2018-07-18 DIAGNOSIS — Z87891 Personal history of nicotine dependence: Secondary | ICD-10-CM | POA: Diagnosis not present

## 2018-07-18 DIAGNOSIS — F329 Major depressive disorder, single episode, unspecified: Secondary | ICD-10-CM | POA: Diagnosis not present

## 2018-07-18 DIAGNOSIS — F32A Depression, unspecified: Secondary | ICD-10-CM

## 2018-07-18 DIAGNOSIS — F79 Unspecified intellectual disabilities: Secondary | ICD-10-CM | POA: Insufficient documentation

## 2018-07-18 DIAGNOSIS — F250 Schizoaffective disorder, bipolar type: Secondary | ICD-10-CM | POA: Diagnosis not present

## 2018-07-18 DIAGNOSIS — Z79899 Other long term (current) drug therapy: Secondary | ICD-10-CM | POA: Diagnosis not present

## 2018-07-18 DIAGNOSIS — I10 Essential (primary) hypertension: Secondary | ICD-10-CM | POA: Diagnosis not present

## 2018-07-18 DIAGNOSIS — G2401 Drug induced subacute dyskinesia: Secondary | ICD-10-CM | POA: Diagnosis not present

## 2018-07-18 DIAGNOSIS — Z7902 Long term (current) use of antithrombotics/antiplatelets: Secondary | ICD-10-CM | POA: Insufficient documentation

## 2018-07-18 DIAGNOSIS — Z046 Encounter for general psychiatric examination, requested by authority: Secondary | ICD-10-CM | POA: Diagnosis present

## 2018-07-18 DIAGNOSIS — R4587 Impulsiveness: Secondary | ICD-10-CM

## 2018-07-18 LAB — COMPREHENSIVE METABOLIC PANEL
ALT: 16 U/L (ref 0–44)
AST: 23 U/L (ref 15–41)
Albumin: 4.1 g/dL (ref 3.5–5.0)
Alkaline Phosphatase: 86 U/L (ref 38–126)
Anion gap: 9 (ref 5–15)
BUN: 16 mg/dL (ref 6–20)
CO2: 28 mmol/L (ref 22–32)
Calcium: 9.8 mg/dL (ref 8.9–10.3)
Chloride: 105 mmol/L (ref 98–111)
Creatinine, Ser: 1.05 mg/dL (ref 0.61–1.24)
GFR calc Af Amer: >60 mL/min (ref >60)
GFR calc non Af Amer: >60 mL/min (ref >60)
Glucose, Bld: 108 mg/dL — ABNORMAL HIGH (ref 70–99)
Potassium: 3.9 mmol/L (ref 3.5–5.1)
Sodium: 142 mmol/L (ref 135–145)
Total Bilirubin: 0.5 mg/dL (ref 0.3–1.2)
Total Protein: 8 g/dL (ref 6.5–8.1)

## 2018-07-18 LAB — SALICYLATE LEVEL: Salicylate Lvl: <7 mg/dL (ref 2.8–30.0)

## 2018-07-18 LAB — CBC WITH DIFFERENTIAL/PLATELET
Abs Immature Granulocytes: 0.01 10*3/uL (ref 0.00–0.07)
Basophils Absolute: 0 10*3/uL (ref 0.0–0.1)
Basophils Relative: 0 %
Eosinophils Absolute: 0 10*3/uL (ref 0.0–0.5)
Eosinophils Relative: 1 %
HCT: 44 % (ref 39.0–52.0)
Hemoglobin: 13.9 g/dL (ref 13.0–17.0)
Immature Granulocytes: 0 %
Lymphocytes Relative: 16 %
Lymphs Abs: 0.8 10*3/uL (ref 0.7–4.0)
MCH: 28.5 pg (ref 26.0–34.0)
MCHC: 31.6 g/dL (ref 30.0–36.0)
MCV: 90.3 fL (ref 80.0–100.0)
Monocytes Absolute: 0.4 10*3/uL (ref 0.1–1.0)
Monocytes Relative: 7 %
Neutro Abs: 4 10*3/uL (ref 1.7–7.7)
Neutrophils Relative %: 76 %
Platelets: 169 10*3/uL (ref 150–400)
RBC: 4.87 MIL/uL (ref 4.22–5.81)
RDW: 15.1 % (ref 11.5–15.5)
WBC: 5.2 10*3/uL (ref 4.0–10.5)
nRBC: 0 % (ref 0.0–0.2)

## 2018-07-18 LAB — URINE DRUG SCREEN, QUALITATIVE (ARMC ONLY)
Amphetamines, Ur Screen: NOT DETECTED
Barbiturates, Ur Screen: NOT DETECTED
Benzodiazepine, Ur Scrn: POSITIVE — AB
Cannabinoid 50 Ng, Ur ~~LOC~~: NOT DETECTED
Cocaine Metabolite,Ur ~~LOC~~: NOT DETECTED
MDMA (Ecstasy)Ur Screen: NOT DETECTED
Methadone Scn, Ur: NOT DETECTED
Opiate, Ur Screen: NOT DETECTED
Phencyclidine (PCP) Ur S: NOT DETECTED
Tricyclic, Ur Screen: NOT DETECTED

## 2018-07-18 LAB — CBC
HCT: 43.4 % (ref 39.0–52.0)
Hemoglobin: 13.8 g/dL (ref 13.0–17.0)
MCH: 28.5 pg (ref 26.0–34.0)
MCHC: 31.8 g/dL (ref 30.0–36.0)
MCV: 89.7 fL (ref 80.0–100.0)
Platelets: 168 10*3/uL (ref 150–400)
RBC: 4.84 MIL/uL (ref 4.22–5.81)
RDW: 15 % (ref 11.5–15.5)
WBC: 5.2 10*3/uL (ref 4.0–10.5)
nRBC: 0 % (ref 0.0–0.2)

## 2018-07-18 LAB — ACETAMINOPHEN LEVEL: Acetaminophen (Tylenol), Serum: <10 ug/mL — ABNORMAL LOW (ref 10–30)

## 2018-07-18 LAB — ETHANOL: Alcohol, Ethyl (B): <10 mg/dL (ref <10)

## 2018-07-18 LAB — VALPROIC ACID LEVEL: Valproic Acid Lvl: 29 ug/mL — ABNORMAL LOW (ref 50.0–100.0)

## 2018-07-18 MED ORDER — LORAZEPAM 0.5 MG PO TABS
0.5000 mg | ORAL_TABLET | Freq: Two times a day (BID) | ORAL | Status: DC | PRN
  Administered 2018-07-19: 0.5 mg via ORAL
  Filled 2018-07-18: qty 1

## 2018-07-18 MED ORDER — CLOZAPINE 100 MG PO TABS
100.0000 mg | ORAL_TABLET | Freq: Every day | ORAL | Status: DC
  Administered 2018-07-18: 100 mg via ORAL
  Filled 2018-07-18: qty 1

## 2018-07-18 MED ORDER — VALBENAZINE TOSYLATE 80 MG PO CAPS: 80.0000 mg | ORAL_CAPSULE | Freq: Every day | ORAL | Status: DC

## 2018-07-18 MED ORDER — DIVALPROEX SODIUM 500 MG PO DR TAB
500.0000 mg | DELAYED_RELEASE_TABLET | ORAL | Status: DC
  Administered 2018-07-19: 500 mg via ORAL
  Filled 2018-07-18: qty 1

## 2018-07-18 MED ORDER — DIVALPROEX SODIUM ER 500 MG PO TB24
1000.0000 mg | ORAL_TABLET | Freq: Every day | ORAL | Status: DC
  Administered 2018-07-18: 1000 mg via ORAL
  Filled 2018-07-18: qty 2

## 2018-07-18 MED ORDER — VITAMIN E 45 MG (100 UNIT) PO CAPS
200.0000 [IU] | ORAL_CAPSULE | Freq: Every day | ORAL | Status: DC
  Administered 2018-07-19: 200 [IU] via ORAL
  Filled 2018-07-18: qty 2

## 2018-07-18 MED ORDER — DIVALPROEX SODIUM 500 MG PO DR TAB: 500.0000 mg | DELAYED_RELEASE_TABLET | Freq: Two times a day (BID) | ORAL | Status: DC

## 2018-07-18 MED ORDER — RESOURCE THICKENUP CLEAR PO POWD
1.0000 | ORAL | Status: DC | PRN
  Filled 2018-07-18: qty 125

## 2018-07-18 MED ORDER — CLOZAPINE 100 MG PO TABS
50.0000 mg | ORAL_TABLET | Freq: Every morning | ORAL | Status: DC
  Administered 2018-07-19: 50 mg via ORAL
  Filled 2018-07-18 (×2): qty 1

## 2018-07-18 MED ORDER — TRAZODONE HCL 100 MG PO TABS
100.0000 mg | ORAL_TABLET | Freq: Every day | ORAL | Status: DC
  Administered 2018-07-18: 100 mg via ORAL
  Filled 2018-07-18: qty 1

## 2018-07-18 MED ORDER — ACETAMINOPHEN 325 MG PO TABS
650.0000 mg | ORAL_TABLET | Freq: Once | ORAL | Status: AC
  Administered 2018-07-18: 650 mg via ORAL
  Filled 2018-07-18: qty 2

## 2018-07-18 MED ORDER — CLOPIDOGREL BISULFATE 75 MG PO TABS
75.0000 mg | ORAL_TABLET | Freq: Every day | ORAL | Status: DC
  Administered 2018-07-19: 11:00:00 75 mg via ORAL
  Filled 2018-07-18: qty 1

## 2018-07-18 NOTE — ED Notes (Signed)
BEHAVIORAL HEALTH ROUNDING Patient sleeping: No. Patient alert and oriented: yes Behavior appropriate: Yes.  ; If no, describe:  Nutrition and fluids offered: yes Toileting and hygiene offered: Yes  Sitter present: q15 minute observations and security camera monitoring Law enforcement present: Yes  ODS  
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
## 2018-07-18 NOTE — ED Notes (Signed)
ED  Is the patient under IVC or is there intent for IVC: voluntary Is the patient medically cleared: Yes.   Is there vacancy in the ED BHU: Yes.   Is the population mix appropriate for patient: Yes.   Is the patient awaiting placement in inpatient or outpatient setting: Yes.  Return to his group home in the am Has the patient had a psychiatric consult: Yes.   Survey of unit performed for contraband, proper placement and condition of furniture, tampering with fixtures in bathroom, shower, and each patient room: Yes.  ; Findings:  APPEARANCE/BEHAVIOR Calm and cooperative NEURO ASSESSMENT Orientation: oriented x3   Hallucinations: No.None noted (Hallucinations) denies Speech: Normal  Slurred at times Gait: normal RESPIRATORY ASSESSMENT Even  Unlabored respirations  CARDIOVASCULAR ASSESSMENT Pulses equal   regular rate  Skin warm and dry   GASTROINTESTINAL ASSESSMENT no GI complaint EXTREMITIES Full ROM  PLAN OF CARE Provide calm/safe environment. Vital signs assessed twice daily. ED BHU Assessment once each 12-hour shift. Collaborate with TTS daily or as condition indicates. Assure the ED provider has rounded once each shift. Provide and encourage hygiene. Provide redirection as needed. Assess for escalating behavior; address immediately and inform ED provider.  Assess family dynamic and appropriateness for visitation as needed: Yes.  ; If necessary, describe findings:  Educate the patient/family about BHU procedures/visitation: Yes.  ; If necessary, describe findings:

## 2018-07-18 NOTE — ED Notes (Signed)
Patient continues to stay out of his room and needs frequent redirection to stay in his room, he does not appear to understand why he needs to stay in his room.
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
## 2018-07-18 NOTE — ED Notes (Signed)
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
## 2018-07-18 NOTE — ED Notes (Signed)
1 green stripe tee shirt, 1 white teeshirt, 1 black sweatshirt, 1 black jean, 1 black belt, 1 pair black tennis shoes, blue plaid boxers, 1 pair white socks.
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
## 2018-07-18 NOTE — ED Notes (Signed)
Pt given meal tray x2.
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
## 2018-07-18 NOTE — ED Notes (Signed)
Spoke with patient legal guardian C. Mare Ferrari and she said patient eats a lot when he is anxious, it calms him down.
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
## 2018-07-18 NOTE — Consult Note (Signed)
PHARMACY - CRITICAL CARE PROGRESS NOTE  Clozapine Reporting and Monitoring   No Known Allergies  Patient Measurements: Height: 6\' 1"  (185.4 cm) Weight: 158 lb (71.7 kg) IBW/kg (Calculated) : 79.9   Vital Signs: Temp: 98.6 F (37 C) (05/27 2030) Temp Source: Oral (05/27 2030) BP: 143/90 (05/27 2030) Pulse Rate: 106 (05/27 2030) Intake/Output from previous day: No intake/output data recorded. Intake/Output from this shift: No intake/output data recorded. Vent settings for last 24 hours:    Labs: Recent Labs    07/18/18 1322  WBC 5.2  5.2  HGB 13.9  13.8  HCT 44.0  43.4  PLT 169  168  CREATININE 1.05  ALBUMIN 4.1  PROT 8.0  AST 23  ALT 16  ALKPHOS 86  BILITOT 0.5   Estimated Creatinine Clearance: 79.7 mL/min (by C-G formula based on SCr of 1.05 mg/dL).  No results for input(s): GLUCAP in the last 72 hours.  Microbiology: No results found for this or any previous visit (from the past 720 hour(s)).  Medications:  (Not in a hospital admission)  Scheduled:  . [START ON 07/19/2018] clopidogrel  75 mg Oral Daily  . clozapine  100 mg Oral QHS  . [START ON 07/19/2018] cloZAPine  50 mg Oral q morning - 10a  . divalproex  1,000 mg Oral QHS  . [START ON 07/19/2018] divalproex  500 mg Oral BH-q7a  . traZODone  100 mg Oral QHS  . Valbenazine Tosylate  80 mg Oral Daily  . Valbenazine Tosylate  80 mg Oral Daily  . [START ON 07/19/2018] vitamin E  200 Units Oral Daily   Infusions:   PRN: LORazepam, Resource ThickenUp Clear  Assessment: Pharmacy monitoring labs and values for patient on Clozapine to ensure safety of administration.  Goal of Therapy:  ANC value less than 10.0 K/uL  Plan:  Will continue to monitor ANC levels with lab reports and monitoring at least every 4 weeks, per protocol.  Pearla Dubonnet, PharmD Clinical Pharmacist 07/18/2018 9:47 PM

## 2018-07-19 DIAGNOSIS — Z79899 Other long term (current) drug therapy: Secondary | ICD-10-CM | POA: Diagnosis not present

## 2018-07-19 DIAGNOSIS — F250 Schizoaffective disorder, bipolar type: Secondary | ICD-10-CM | POA: Diagnosis not present

## 2018-07-19 DIAGNOSIS — R4587 Impulsiveness: Secondary | ICD-10-CM | POA: Diagnosis not present

## 2018-07-19 MED ORDER — CLOZAPINE 100 MG PO TABS
100.0000 mg | ORAL_TABLET | Freq: Every day | ORAL | Status: AC
  Administered 2018-07-19: 100 mg via ORAL

## 2018-07-19 MED ORDER — CLOZAPINE 100 MG PO TABS: 150.0000 mg | ORAL_TABLET | Freq: Three times a day (TID) | ORAL | Status: DC

## 2018-07-19 NOTE — ED Notes (Signed)
He discharged with a caregiver from his group home

## 2018-07-19 NOTE — ED Notes (Signed)
Pt. Up using bathroom. 

## 2018-07-19 NOTE — ED Notes (Signed)
Pt. Up using bathroom.  Pt. Returned to room with steady gait.

## 2018-07-19 NOTE — ED Notes (Signed)
Pt. Up using bathroom, pt. Returned to room with steady gait. 

## 2018-07-19 NOTE — ED Notes (Signed)
Pt observed sitting in the dayroom  - eating graham crackers  NAD observed

## 2018-07-19 NOTE — ED Notes (Signed)
BEHAVIORAL HEALTH ROUNDING Patient sleeping: No. Patient alert and oriented: yes Behavior appropriate: Yes.  ; If no, describe:  Nutrition and fluids offered: yes Toileting and hygiene offered: Yes  Sitter present: q15 minute observations and security camera monitoring Law enforcement present: Yes  ODS  

## 2018-07-19 NOTE — ED Notes (Signed)
Pt. Up using bathroom.  Pt. Needed to be directed back to room.

## 2018-07-19 NOTE — ED Notes (Signed)
ED Is the patient under IVC or is there intent for IVC: Yes.   Is the patient medically cleared: Yes.   Is there vacancy in the ED BHU: Yes.   Is the population mix appropriate for patient: Yes.   Is the patient awaiting placement in inpatient or outpatient setting: Yes.  Return to his group home   Has the patient had a psychiatric consult: Yes.   Survey of unit performed for contraband, proper placement and condition of furniture, tampering with fixtures in bathroom, shower, and each patient room: Yes.  ; Findings:  APPEARANCE/BEHAVIOR Calm and cooperative NEURO ASSESSMENT Orientation: oriented x3  Denies pain Hallucinations: No.None noted (Hallucinations)  denies Speech: Normal Gait: normal RESPIRATORY ASSESSMENT Even  Unlabored respirations  CARDIOVASCULAR ASSESSMENT Pulses equal   regular rate  Skin warm and dry   GASTROINTESTINAL ASSESSMENT no GI complaint EXTREMITIES Full ROM  PLAN OF CARE Provide calm/safe environment. Vital signs assessed twice daily. ED BHU Assessment once each 12-hour shift. Collaborate with TTS daily or as condition indicates. Assure the ED provider has rounded once each shift. Provide and encourage hygiene. Provide redirection as needed. Assess for escalating behavior; address immediately and inform ED provider.  Assess family dynamic and appropriateness for visitation as needed: Yes.  ; If necessary, describe findings:  Educate the patient/family about BHU procedures/visitation: Yes.  ; If necessary, describe findings:
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
## 2018-07-19 NOTE — ED Notes (Signed)
BEHAVIORAL HEALTH ROUNDING Patient sleeping: No. Patient alert and oriented: yes Behavior appropriate: Yes. - sitting in the dayroom  ; If no, describe:  Nutrition and fluids offered: yes Toileting and hygiene offered: Yes  Sitter present: q15 minute observations and security camera monitoring Law enforcement present: Yes  ODS  ENVIRONMENTAL ASSESSMENT Potentially harmful objects out of patient reach: Yes.   Personal belongings secured: Yes.   Patient dressed in hospital provided attire only: Yes.   Plastic bags out of patient reach: Yes.   Patient care equipment (cords, cables, call bells, lines, and drains) shortened, removed, or accounted for: Yes.   Equipment and supplies removed from bottom of stretcher: Yes.   Potentially toxic materials out of patient reach: Yes.   Sharps container removed or out of patient reach: Yes.

## 2018-08-29 ENCOUNTER — Emergency Department
Admission: EM | Admit: 2018-08-29 | Discharge: 2018-08-30 | Disposition: A | Discharge status: Home / Self Care | Payer: Medicaid Other | Attending: Emergency Medicine | Admitting: Emergency Medicine

## 2018-08-29 DIAGNOSIS — S61412A Laceration without foreign body of left hand, initial encounter: Secondary | ICD-10-CM | POA: Diagnosis not present

## 2018-08-29 DIAGNOSIS — Y929 Unspecified place or not applicable: Secondary | ICD-10-CM | POA: Insufficient documentation

## 2018-08-29 DIAGNOSIS — Y998 Other external cause status: Secondary | ICD-10-CM | POA: Diagnosis not present

## 2018-08-29 DIAGNOSIS — F259 Schizoaffective disorder, unspecified: Secondary | ICD-10-CM | POA: Insufficient documentation

## 2018-08-29 DIAGNOSIS — Z87891 Personal history of nicotine dependence: Secondary | ICD-10-CM | POA: Diagnosis not present

## 2018-08-29 DIAGNOSIS — W25XXXA Contact with sharp glass, initial encounter: Secondary | ICD-10-CM | POA: Diagnosis not present

## 2018-08-29 DIAGNOSIS — I10 Essential (primary) hypertension: Secondary | ICD-10-CM | POA: Diagnosis not present

## 2018-08-29 DIAGNOSIS — R4689 Other symptoms and signs involving appearance and behavior: Secondary | ICD-10-CM | POA: Diagnosis not present

## 2018-08-29 DIAGNOSIS — Z7902 Long term (current) use of antithrombotics/antiplatelets: Secondary | ICD-10-CM | POA: Insufficient documentation

## 2018-08-29 DIAGNOSIS — Y9389 Activity, other specified: Secondary | ICD-10-CM | POA: Insufficient documentation

## 2018-08-29 DIAGNOSIS — R456 Violent behavior: Secondary | ICD-10-CM | POA: Insufficient documentation

## 2018-08-29 DIAGNOSIS — Z79899 Other long term (current) drug therapy: Secondary | ICD-10-CM | POA: Insufficient documentation

## 2018-08-29 DIAGNOSIS — F71 Moderate intellectual disabilities: Secondary | ICD-10-CM | POA: Insufficient documentation

## 2018-08-29 LAB — CBC
HCT: 40.5 % (ref 39.0–52.0)
Hemoglobin: 13.2 g/dL (ref 13.0–17.0)
MCH: 29.7 pg (ref 26.0–34.0)
MCHC: 32.6 g/dL (ref 30.0–36.0)
MCV: 91.2 fL (ref 80.0–100.0)
Platelets: 180 10*3/uL (ref 150–400)
RBC: 4.44 MIL/uL (ref 4.22–5.81)
RDW: 14.6 % (ref 11.5–15.5)
WBC: 6.6 10*3/uL (ref 4.0–10.5)
nRBC: 0 % (ref 0.0–0.2)

## 2018-08-29 LAB — URINE DRUG SCREEN, QUALITATIVE (ARMC ONLY)
Amphetamines, Ur Screen: NOT DETECTED
Barbiturates, Ur Screen: NOT DETECTED
Benzodiazepine, Ur Scrn: NOT DETECTED
Cannabinoid 50 Ng, Ur ~~LOC~~: NOT DETECTED
Cocaine Metabolite,Ur ~~LOC~~: NOT DETECTED
MDMA (Ecstasy)Ur Screen: NOT DETECTED
Methadone Scn, Ur: NOT DETECTED
Opiate, Ur Screen: NOT DETECTED
Phencyclidine (PCP) Ur S: NOT DETECTED
Tricyclic, Ur Screen: NOT DETECTED

## 2018-08-29 LAB — COMPREHENSIVE METABOLIC PANEL
ALT: 16 U/L (ref 0–44)
AST: 19 U/L (ref 15–41)
Albumin: 4.3 g/dL (ref 3.5–5.0)
Alkaline Phosphatase: 88 U/L (ref 38–126)
Anion gap: 8 (ref 5–15)
BUN: 19 mg/dL (ref 6–20)
CO2: 29 mmol/L (ref 22–32)
Calcium: 9.9 mg/dL (ref 8.9–10.3)
Chloride: 105 mmol/L (ref 98–111)
Creatinine, Ser: 1 mg/dL (ref 0.61–1.24)
GFR calc Af Amer: >60 mL/min (ref >60)
GFR calc non Af Amer: >60 mL/min (ref >60)
Glucose, Bld: 136 mg/dL — ABNORMAL HIGH (ref 70–99)
Potassium: 4.1 mmol/L (ref 3.5–5.1)
Sodium: 142 mmol/L (ref 135–145)
Total Bilirubin: 0.4 mg/dL (ref 0.3–1.2)
Total Protein: 8 g/dL (ref 6.5–8.1)

## 2018-08-29 LAB — URINALYSIS, COMPLETE (UACMP) WITH MICROSCOPIC
Bacteria, UA: NONE SEEN
Bilirubin Urine: NEGATIVE
Glucose, UA: NEGATIVE mg/dL
Hgb urine dipstick: NEGATIVE
Ketones, ur: 5 mg/dL — AB
Leukocytes,Ua: NEGATIVE
Nitrite: NEGATIVE
Protein, ur: NEGATIVE mg/dL
Specific Gravity, Urine: 1.018 (ref 1.005–1.030)
WBC, UA: NONE SEEN WBC/hpf (ref 0–5)
pH: 6 (ref 5.0–8.0)

## 2018-08-29 LAB — ETHANOL: Alcohol, Ethyl (B): <10 mg/dL (ref <10)

## 2018-08-29 LAB — ACETAMINOPHEN LEVEL: Acetaminophen (Tylenol), Serum: <10 ug/mL — ABNORMAL LOW (ref 10–30)

## 2018-08-29 LAB — SALICYLATE LEVEL: Salicylate Lvl: <7 mg/dL (ref 2.8–30.0)

## 2018-08-29 MED ORDER — DIVALPROEX SODIUM 500 MG PO DR TAB
500.0000 mg | DELAYED_RELEASE_TABLET | Freq: Two times a day (BID) | ORAL | Status: DC
  Administered 2018-08-29 – 2018-08-30 (×2): 500 mg via ORAL
  Filled 2018-08-29 (×2): qty 1

## 2018-08-29 MED ORDER — CLOZAPINE 25 MG PO TABS: 50.0000 mg | ORAL_TABLET | Freq: Two times a day (BID) | ORAL | Status: DC

## 2018-08-29 MED ORDER — RESOURCE THICKENUP CLEAR PO POWD
1.0000 | ORAL | Status: DC | PRN
  Filled 2018-08-29: qty 125

## 2018-08-29 MED ORDER — DIPHENHYDRAMINE HCL 25 MG PO CAPS
50.0000 mg | ORAL_CAPSULE | Freq: Four times a day (QID) | ORAL | Status: DC | PRN
  Administered 2018-08-29: 22:00:00 50 mg via ORAL
  Filled 2018-08-29: qty 2

## 2018-08-29 MED ORDER — ACETAMINOPHEN 500 MG PO TABS
500.0000 mg | ORAL_TABLET | Freq: Four times a day (QID) | ORAL | Status: DC | PRN
  Administered 2018-08-30: 13:00:00 500 mg via ORAL
  Filled 2018-08-29: qty 1

## 2018-08-29 MED ORDER — CLOPIDOGREL BISULFATE 75 MG PO TABS
75.0000 mg | ORAL_TABLET | Freq: Every day | ORAL | Status: DC
  Administered 2018-08-30: 11:00:00 75 mg via ORAL
  Filled 2018-08-29: qty 1

## 2018-08-29 MED ORDER — BENZTROPINE MESYLATE 1 MG PO TABS
1.0000 mg | ORAL_TABLET | Freq: Two times a day (BID) | ORAL | Status: DC
  Administered 2018-08-29 – 2018-08-30 (×2): 1 mg via ORAL
  Filled 2018-08-29 (×2): qty 1

## 2018-08-29 MED ORDER — LORAZEPAM 1 MG PO TABS
1.0000 mg | ORAL_TABLET | Freq: Every day | ORAL | Status: DC
  Administered 2018-08-29 – 2018-08-30 (×2): 1 mg via ORAL
  Filled 2018-08-29 (×2): qty 1

## 2018-08-29 MED ORDER — HALOPERIDOL 5 MG PO TABS
5.0000 mg | ORAL_TABLET | Freq: Every day | ORAL | Status: DC
  Administered 2018-08-29 – 2018-08-30 (×2): 5 mg via ORAL
  Filled 2018-08-29 (×2): qty 1

## 2018-08-29 MED ORDER — LIDOCAINE HCL (PF) 1 % IJ SOLN
INTRAMUSCULAR | Status: AC
  Administered 2018-08-29: 22:00:00 5 mL via INTRADERMAL
  Filled 2018-08-29: qty 5

## 2018-08-29 MED ORDER — CLOZAPINE 100 MG PO TABS
100.0000 mg | ORAL_TABLET | Freq: Three times a day (TID) | ORAL | Status: DC
  Administered 2018-08-29 – 2018-08-30 (×2): 100 mg via ORAL
  Filled 2018-08-29 (×2): qty 1

## 2018-08-29 MED ORDER — CLOZAPINE 25 MG PO TABS: 100.0000 mg | ORAL_TABLET | Freq: Three times a day (TID) | ORAL | Status: DC

## 2018-08-29 MED ORDER — TRAZODONE HCL 100 MG PO TABS
100.0000 mg | ORAL_TABLET | Freq: Every day | ORAL | Status: DC
  Administered 2018-08-29: 22:00:00 100 mg via ORAL
  Filled 2018-08-29: qty 1

## 2018-08-29 MED ORDER — LIDOCAINE HCL (PF) 1 % IJ SOLN
5.0000 mL | Freq: Once | INTRAMUSCULAR | Status: AC
  Administered 2018-08-29: 22:00:00 5 mL via INTRADERMAL

## 2018-08-29 NOTE — ED Notes (Signed)
Called Guardian(Carolyn)@[phone] to let her know pt. Was here and being treated.  Guardian believes medication changed in past two weeks.  Guardian also gave name(Robin) and # [phone] of group home.

## 2018-08-29 NOTE — Consult Note (Signed)
[place] Psychiatry Consult   Reason for Consult: Aggression Referring Physician:  Dr. Cherylann Banas Patient Identification: [place] Gates MRN:  [id number] Principal Diagnosis: <principal problem not specified> Diagnosis:  Active Problems:   Aggression   Total Time spent with patient: 1 hour  Subjective: " Hey, hey,hey...."  [place] Gates is a 58 y.o. male patient presented to [facility] ED via EMS voluntarily.Per Shirlean Mylar, who is a staff member of the group home.  She stated "the patient has been acting a little strange during the past couple of days."  She states "he was sitting in the living room quietly and out of nowhere he got up and punch out the glass."  Ms. Shirlean Mylar states that the patient medications has been tweaked the past few weeks.  And also he was on Ativan 1 mg twice a day and that has been changed to once a day.  Upon the patient assessment he remains at baseline during his ED visit.  He has to be constantly redirected.  This is his baseline. The patient was seen face-to-face by this provider; chart reviewed and consulted with Dr. Cherylann Banas on 08/29/2018 due to the care of the patient. It was discussed with the provider that the patient does not meet criteria to be admitted to the inpatient unit. The patient remains at baseline during his ED stay.  On evaluation the patient is alert and oriented x 1-2, anxious and somewhat cooperative with congruent-mood along with affect. The patient does not appear to be responding to internal or external stimuli. The patient is presenting with some delusional thinking. Which is the patient baseline.  He denies auditory or visual hallucinations. The patient denies any suicidal, homicidal, or self-harm ideations. The patient is presenting with psychotic behaviors which is his baseline.  During an encounter with the patient, he was unable to answer most questions appropriately.  Collateral was obtained by Ms. Robin (group home staff) [phone] who  expresses concerns for patient's behaviors during the incident that took place and led him to the ED.  She agreed to pick up the patient.  She states to have a staff member call her in the a.m. and to inform her as to what time the patient will be ready for discharge.   Plan: The patient is not a safety risk to self or others and does not require psychiatric inpatient admission for stabilization and treatment. The patient will continue on all home medications and to increase Ativan 1 mg 2 times daily as needed for agitation.   HPI:  Per Dr. Cherylann Banas; [place] Gates is a 58 y.o. male with PMH as noted below who presents after he became angry and punched a window.  He has a laceration to the left third MCP joint area.  The patient denies that he was actively trying to harm himself, and denies SI or HI at this time.  He has a few abrasions to the right hand but no other injuries.  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 Hypertension   . Intellectual disability   . Obsessive-compulsive disorder   . Schizo-affective schizophrenia [facility])     Past Surgical History:  Procedure Laterality Date  . COLONOSCOPY WITH PROPOFOL N/A 12/28/2017   Procedure: COLONOSCOPY WITH PROPOFOL;  Surgeon: Jonathon Bellows, MD;  Location[facility] ENDOSCOPY;  Service: Gastroenterology;  Laterality: N/A;  . HYDROCELE EXCISION Bilateral 02/22/2018   Procedure: bilateral HYDROCELECTOMY ADULT;  Surgeon: Cleon Gustin, MD;  Location: [place];  Service: Urology;  Laterality: Bilateral;  . INSERTION OF ILIAC STENT Left 02/22/2018   Procedure: INSERTION LEFT SUPERIOR FEMORAL ARTERY USING 6MM X 5CM VIABHON STENT with mynx device closure on right femoral artery;   Surgeon: Waynetta Sandy, MD;  Location: [place];  Service: Vascular;  Laterality: Left;  . KNEE ARTHROSCOPY    . SCROTAL EXPLORATION N/A 02/22/2018   Procedure: SCROTUM EXPLORATION;  Surgeon: Cleon Gustin, MD;  Location: [place];  Service: Urology;  Laterality: N/A;  . UPPER EXTREMITY ANGIOGRAM Left 02/22/2018   Procedure: left lower EXTREMITY ANGIOGRAM;  Surgeon: Waynetta Sandy, MD;  [facility] OR;  Service: Vascular;  Laterality: Left;   Family History:  Family History  Problem Relation Age of Onset  . Diabetes Mother    Family Psychiatric  History: Unknown Social History:  Social History   Substance and Sexual Activity  Alcohol Use No     Social History   Substance and Sexual Activity  Drug Use No    Social History   Socioeconomic History  . Marital status: Single    Spouse name: Not on file  . Number of children: Not on file  . Years of education: Not on file  . Highest education level: Not on file  Occupational History  . Occupation: Disabled  Social Needs  . Financial resource strain: Not on file  . Food insecurity    Worry: Not on file    Inability: Not on file  . Transportation needs    Medical: Not on file    Non-medical: Not on file  Tobacco Use  . Smoking status: Former Smoker    Types: Cigarettes  . Smokeless tobacco: Never Used  Substance and Sexual Activity  . Alcohol use: No  . Drug use: No  . Sexual activity: Never  Lifestyle  . Physical activity    Days per week: Not on file    Minutes per session: Not on file  . Stress: Not on file  Relationships  . Social Herbalist on phone: Not on file    Gets together: Not on file    Attends religious service: Not on file    Active member of club or organization: Not on file    Attends meetings of clubs or organizations: Not on file    Relationship status: Not on file  Other Topics Concern  . Not on file  Social History Narrative   ** Merged History Encounter **        Additional Social History:    Allergies:  No Known Allergies  Labs:  Results for orders placed or performed during the hospital encounter of 08/29/18 (from the past 48 hour(s))  CBC     Status: None   Collection Time: 08/29/18  6:54 PM  Result Value Ref Range   WBC 6.6 4.0 - 10.5 K/uL   RBC 4.44 4.22 - 5.81 MIL/uL   Hemoglobin 13.2 13.0 - 17.0 g/dL   HCT 40.5 39.0 - 52.0 %   MCV 91.2 80.0 - 100.0 fL   MCH 29.7 26.0 - 34.0 pg   MCHC 32.6 30.0 - 36.0 g/dL   RDW 14.6 11.5 - 15.5 %   Platelets 180 150 - 400 K/uL   nRBC 0.0 0.0 - 0.2 %  Comment: Performed at [facility], [address] [place], [place] [address]  Ethanol     Status: None   Collection Time: 08/29/18  6:54 PM  Result Value Ref Range   Alcohol, Ethyl (B) <10 <10 mg/dL    Comment: (NOTE) Lowest detectable limit for serum alcohol is 10 mg/dL. For medical purposes only. Performed at [facility], [address] [place], [place] [address]   Acetaminophen level     Status: Abnormal   Collection Time: 08/29/18  6:54 PM  Result Value Ref Range   Acetaminophen (Tylenol), Serum <10 (L) 10 - 30 ug/mL    Comment: (NOTE) Therapeutic concentrations vary significantly. A range of 10-30 ug/mL  may be an effective concentration for many patients. However, some  are best treated at concentrations outside of this range. Acetaminophen concentrations >150 ug/mL at 4 hours after ingestion  and >50 ug/mL at 12 hours after ingestion are often associated with  toxic reactions. Performed at [facility], [address] [place], [place] [address]   Comprehensive metabolic panel     Status: Abnormal   Collection Time: 08/29/18  6:54 PM  Result Value Ref Range   Sodium 142 135 - 145 mmol/L   Potassium 4.1 3.5 - 5.1 mmol/L   Chloride 105 98 - 111 mmol/L   CO2 29 22 - 32 mmol/L   Glucose, Bld 136 (H) 70 - 99 mg/dL   BUN 19 6 - 20 mg/dL   Creatinine, Ser 1.00 0.61 - 1.24 mg/dL   Calcium  9.9 8.9 - 10.3 mg/dL   Total Protein 8.0 6.5 - 8.1 g/dL   Albumin 4.3 3.5 - 5.0 g/dL   AST 19 15 - 41 U/L   ALT 16 0 - 44 U/L   Alkaline Phosphatase 88 38 - 126 U/L   Total Bilirubin 0.4 0.3 - 1.2 mg/dL   GFR calc non Af Amer >60 >60 mL/min   GFR calc Af Amer >60 >60 mL/min   Anion gap 8 5 - 15    Comment: Performed at [facility], [address] [place], [place] [address]  Urinalysis, Complete w Microscopic     Status: Abnormal   Collection Time: 08/29/18  6:54 PM  Result Value Ref Range   Color, Urine YELLOW (A) YELLOW   APPearance CLEAR (A) CLEAR   Specific Gravity, Urine 1.018 1.005 - 1.030   pH 6.0 5.0 - 8.0   Glucose, UA NEGATIVE NEGATIVE mg/dL   Hgb urine dipstick NEGATIVE NEGATIVE   Bilirubin Urine NEGATIVE NEGATIVE   Ketones, ur 5 (A) NEGATIVE mg/dL   Protein, ur NEGATIVE NEGATIVE mg/dL   Nitrite NEGATIVE NEGATIVE   Leukocytes,Ua NEGATIVE NEGATIVE   RBC / HPF 0-5 0 - 5 RBC/hpf   WBC, UA NONE SEEN 0 - 5 WBC/hpf   Bacteria, UA NONE SEEN NONE SEEN   Squamous Epithelial / LPF 0-5 0 - 5   Mucus PRESENT    Hyaline Casts, UA PRESENT     Comment: Performed at [facility], [address] [place], [place] [address]  Urine Drug Screen, Qualitative ([facility] only)     Status: None   Collection Time: 08/29/18  6:54 PM  Result Value Ref Range   Tricyclic, Ur Screen NONE DETECTED NONE DETECTED   Amphetamines, Ur Screen NONE DETECTED NONE DETECTED   MDMA (Ecstasy)Ur Screen NONE DETECTED NONE DETECTED   Cocaine Metabolite,Ur [place] NONE DETECTED NONE DETECTED   Opiate, Ur Screen NONE DETECTED NONE DETECTED  Phencyclidine (PCP) Ur S NONE DETECTED NONE DETECTED   Cannabinoid 50 Ng, Ur [place] NONE DETECTED NONE DETECTED   Barbiturates, Ur Screen NONE DETECTED NONE DETECTED   Benzodiazepine, Ur Scrn NONE DETECTED NONE DETECTED   Methadone Scn, Ur NONE DETECTED NONE DETECTED    Comment: (NOTE) Tricyclics + metabolites, urine    Cutoff 1000 ng/mL Amphetamines +  metabolites, urine  Cutoff 1000 ng/mL MDMA (Ecstasy), urine              Cutoff 500 ng/mL Cocaine Metabolite, urine          Cutoff 300 ng/mL Opiate + metabolites, urine        Cutoff 300 ng/mL Phencyclidine (PCP), urine         Cutoff 25 ng/mL Cannabinoid, urine                 Cutoff 50 ng/mL Barbiturates + metabolites, urine  Cutoff 200 ng/mL Benzodiazepine, urine              Cutoff 200 ng/mL Methadone, urine                   Cutoff 300 ng/mL The urine drug screen provides only a preliminary, unconfirmed analytical test result and should not be used for non-medical purposes. Clinical consideration and professional judgment should be applied to any positive drug screen result due to possible interfering substances. A more specific alternate chemical method must be used in order to obtain a confirmed analytical result. Gas chromatography / mass spectrometry (GC/MS) is the preferred confirmat ory method. Performed at [facility], [address] [place], [place] [address]   Salicylate level     Status: None   Collection Time: 08/29/18  6:54 PM  Result Value Ref Range   Salicylate Lvl <4.5 2.8 - 30.0 mg/dL    Comment: Performed at [facility], [address] [place], [place] [address]    Current Facility-Administered Medications  Medication Dose Route Frequency Provider Last Rate Last Dose  . acetaminophen (TYLENOL) tablet 500 mg  500 mg Oral Q6H PRN Arta Silence, MD      . benztropine (COGENTIN) tablet 1 mg  1 mg Oral BID Arta Silence, MD   1 mg at 08/29/18 2152  . [START ON 08/30/2018] clopidogrel (PLAVIX) tablet 75 mg  75 mg Oral Daily Arta Silence, MD      . cloZAPine (CLOZARIL) tablet 100 mg  100 mg Oral TID Arta Silence, MD   100 mg at 08/29/18 2153  . diphenhydrAMINE (BENADRYL) capsule 50 mg  50 mg Oral Q6H PRN Arta Silence, MD   50 mg at 08/29/18 2152  . divalproex (DEPAKOTE) DR tablet 500 mg  500 mg Oral BID  Arta Silence, MD   500 mg at 08/29/18 2153  . haloperidol (HALDOL) tablet 5 mg  5 mg Oral Daily Arta Silence, MD      . LORazepam (ATIVAN) tablet 1 mg  1 mg Oral Daily Arta Silence, MD      . Resource ThickenUp Clear 120 g  1 Can Oral PRN Arta Silence, MD      . traZODone (DESYREL) tablet 100 mg  100 mg Oral QHS Arta Silence, MD   100 mg at 08/29/18 2153   Current Outpatient Medications  Medication Sig Dispense Refill  . acetaminophen (TYLENOL) 500 MG tablet Take 500 mg by mouth every 6 (six) hours as needed for mild pain.    . benztropine (COGENTIN) 1 MG tablet Take 1  mg by mouth 2 (two) times daily.    . clopidogrel (PLAVIX) 75 MG tablet Take 1 tablet (75 mg total) by mouth daily. 30 tablet 0  . cloZAPine (CLOZARIL) 100 MG tablet Take 1.5 tablets (150 mg total) by mouth 3 (three) times daily. At 8 AM, 5PM, 8PM per Dr. Darleene Cleaver (Patient taking differently: Take 100-200 mg by mouth 3 (three) times daily. One tablet (100 mg) At 8 AM, and 2PM, two tablets (200 mg) at bedtime per Dr. Darleene Cleaver)    . clozapine (CLOZARIL) 50 MG tablet Take 50 mg by mouth 2 (two) times daily. At 8 am and 2 pm    . diphenhydrAMINE (BENADRYL) 50 MG capsule Take 50 mg by mouth every 6 (six) hours as needed.    . divalproex (DEPAKOTE) 500 MG DR tablet Take 1 tablet (500 mg total) by mouth 2 (two) times daily. 30 tablet 0  . haloperidol (HALDOL) 5 MG tablet Take 5 mg by mouth daily. As needed for severe agitation    . traZODone (DESYREL) 100 MG tablet Take 100 mg by mouth at bedtime.    Marland Kitchen LORazepam (ATIVAN) 1 MG tablet Take 1 mg by mouth daily. As needed for severe agitation    . Maltodextrin-Xanthan Gum (RESOURCE THICKENUP CLEAR) POWD Take 120 g by mouth as needed. (Patient not taking: Reported on 04/07/2018) 1 Can 1    Musculoskeletal: Strength & Muscle Tone: within normal limits Gait & Station: normal Patient leans: N/A  Psychiatric Specialty Exam: Physical Exam  Nursing note and  vitals reviewed. Constitutional: He is oriented to person, place, and time. He appears well-developed and well-nourished.  HENT:  Head: Normocephalic and atraumatic.  Eyes: Pupils are equal, round, and reactive to light. Conjunctivae are normal.  Neck: Normal range of motion. Neck supple.  Cardiovascular: Normal rate and regular rhythm.  Respiratory: Effort normal and breath sounds normal.  Musculoskeletal: Normal range of motion.  Neurological: He is alert and oriented to person, place, and time.  Skin: Skin is warm and dry.    Review of Systems  Psychiatric/Behavioral: Positive for hallucinations. The patient is nervous/anxious and has insomnia.   All other systems reviewed and are negative.   Blood pressure (!) 113/99, pulse (!) 120, temperature 97.8 F (36.6 C), temperature source Oral, resp. rate 18, SpO2 98 %.There is no height or weight on file to calculate BMI.  General Appearance: Disheveled  Eye Contact:  Minimal  Speech:  Garbled  Volume:  Decreased  Mood:  NA  Affect:  Non-Congruent  Thought Process:  Disorganized and Irrelevant  Orientation:  Other:  Self  Thought Content:  Illogical, Delusions, Paranoid Ideation and Rumination  Suicidal Thoughts:  No  Homicidal Thoughts:  No  Memory:  Immediate;   Poor Recent;   Poor  Judgement:  Impaired  Insight:  Lacking  Psychomotor Activity:  Normal  Concentration:  Concentration: Poor and Attention Span: Poor  Recall:  Poor  Fund of Knowledge:  Poor  Language:  Poor  Akathisia:  Negative  Handed:  Right  AIMS (if indicated):     Assets:  Desire for Improvement Resilience Social Support  ADL's:  Intact  Cognition:  Impaired,  Severe  Sleep:   Insomnia     Treatment Plan Summary: Daily contact with patient to assess and evaluate symptoms and progress in treatment, Medication management and Plan The patient does not meet criteria for psychiatric inpatient admission.  Disposition: No evidence of imminent risk to  self or others at present.  Patient does not meet criteria for psychiatric inpatient admission. Supportive therapy provided about ongoing stressors. Lamont Dowdy, NP 08/29/2018 10:03 PM
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
## 2018-08-29 NOTE — ED Notes (Signed)
Patients personal items put in one bag.  Contents: blue shorts, blue stripped underwear, pair of white socks, white t-shirt, white undershirt and a pair of black shoes.

## 2018-08-29 NOTE — ED Notes (Signed)
Pt. Has a 5 cm laceration to the 3 digit knuckle on lt. Hand.
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
## 2018-08-30 DIAGNOSIS — R456 Violent behavior: Secondary | ICD-10-CM

## 2018-08-30 DIAGNOSIS — R4689 Other symptoms and signs involving appearance and behavior: Secondary | ICD-10-CM

## 2018-08-30 NOTE — ED Notes (Addendum)
Austin Gates, from [facility] group [facility] here to pick up pt at this time. Caregiver verbalizes d/c understanding and follow up with psych. PT in NAD, pt ambulatory to car. Legal guardian aware of pt discharge, see earlier note

## 2018-08-30 NOTE — ED Notes (Signed)
Pt taken off gauze around stitches, refuses to keep on. Will continue to monitor

## 2018-08-30 NOTE — ED Notes (Signed)
Spoke with Shirlean Mylar from group home, states she can not pick him up until at least lunch time. States she is trying to" prepare the home to make sure it is safe for him" States she is not neglecting him. This RN encouraged staff to come as soon as possible for discharge.

## 2018-08-30 NOTE — BH Assessment (Signed)
Writer spoke with patient to complete updated/reassessment. Patient reports of doing well. He states he want to go back home. Patient denies SI/HI and AV/H.

## 2018-08-30 NOTE — ED Notes (Signed)
Pt has been up to the nurses' station multiple times with various requests for water and snacks. Pt has been advised of unit rules and has been given water. Pt requires redirection repeatedly to try to rest. He remains pleasant and cooperative.

## 2018-08-30 NOTE — Discharge Instructions (Addendum)
You have been seen in the emergency department for a  psychiatric concern. You have been evaluated both medically as well as psychiatrically. Please follow-up with your outpatient resources provided. Return to the emergency department for any worsening symptoms, or any thoughts of hurting yourself or anyone else so that we may attempt to help you. 

## 2018-08-30 NOTE — ED Notes (Signed)
Pt unable to sign discharge due to intellectual disability

## 2018-08-30 NOTE — ED Notes (Addendum)
Group home staff is not answering at this time

## 2018-08-30 NOTE — ED Notes (Signed)
Pt given belongings at discharge and changed into clothes

## 2018-08-30 NOTE — ED Notes (Signed)
PT stitches noted to be bleeding, all stitches are intact with open skin noted in between 2 stitches. Bleeding controlled.  Suanne Marker, PA made aware and given instructions to clean and wrap in gauze.Wound is clean and dry and wrapped at this time. Will continue to monitor .

## 2018-08-30 NOTE — ED Notes (Signed)
Pt using phone at this time.  

## 2018-08-30 NOTE — ED Notes (Signed)
Austin Gates, legal guardian made aware of discharge and waiting on group home for ride at this time

## 2018-08-30 NOTE — ED Notes (Signed)
Robin from group home called back at this time, states they will be here at 3pm to pick up pt

## 2018-08-30 NOTE — ED Notes (Signed)
Pt given a meal tray and a soda.

## 2018-08-30 NOTE — ED Notes (Signed)
PT taking shower at this time, given fresh scrubs and underwear.

## 2018-08-30 NOTE — Consult Note (Signed)
[facility] Face-to-Face Psychiatry Consult   Reason for Consult:  Aggression Referring Physician:  EDp Patient Identification: Austin Gates MRN:  [id number] Principal Diagnosis: <principal problem not specified> Diagnosis:  Active Problems:   Aggression   Total Time spent with patient: 20 minutes  Subjective:   Austin Gates is a 58 y.o. male patient reports that he is doing good today.  Patient denies any suicidal homicidal ideations and denies any hallucinations.  Patient states that he is ready to go back to the group home.  Patient does show me his right hand over and states that he had punched a window when he became angry.  Patient denies having any aggressive thoughts at this moment.  HPI:  Per Dr. Cherylann Gates; Austin Cruise Baldwinis a 58 y.o.malewith PMH as noted below who presents after he became angry and punched a window. He has a laceration to the left third MCP joint area. The patient denies that he was actively trying to harm himself, and denies SI or HI at this time. He has a few abrasions to the right hand but no other injuries.  Patient is seen by me face-to-face.  I have consulted with Dr. Dwyane Dee.  Patient does not meet inpatient criteria and is remained calm on the unit and has been compliant with treatment.  Plan is for patient to discharge today and return back to group home.  Now is currently discharged and waiting transportation back to group.  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 Hypertension   . Intellectual disability   . Obsessive-compulsive disorder   . Schizo-affective schizophrenia [facility])     Past Surgical History:  Procedure Laterality Date  . COLONOSCOPY WITH PROPOFOL N/A 12/28/2017   Procedure: COLONOSCOPY WITH PROPOFOL;   Surgeon: Jonathon Bellows, MD;  Location: [facility] ENDOSCOPY;  Service: Gastroenterology;  Laterality: N/A;  . HYDROCELE EXCISION Bilateral 02/22/2018   Procedure: bilateral HYDROCELECTOMY ADULT;  Surgeon: Cleon Gustin, MD;  Location: [place];  Service: Urology;  Laterality: Bilateral;  . INSERTION OF ILIAC STENT Left 02/22/2018   Procedure: INSERTION LEFT SUPERIOR FEMORAL ARTERY USING 6MM X 5CM VIABHON STENT with mynx device closure on right femoral artery;  Surgeon: Waynetta Sandy, MD;  Location: [place];  Service: Vascular;  Laterality: Left;  . KNEE ARTHROSCOPY    . SCROTAL EXPLORATION N/A 02/22/2018   Procedure: SCROTUM EXPLORATION;  Surgeon: Cleon Gustin, MD;  Location: [place];  Service: Urology;  Laterality: N/A;  . UPPER EXTREMITY ANGIOGRAM Left 02/22/2018   Procedure: left lower EXTREMITY ANGIOGRAM;  Surgeon: Waynetta Sandy, MD;  Location[facility] OR;  Service: Vascular;  Laterality: Left;   Family History:  Family History  Problem Relation Age of Onset  . Diabetes Mother    Family Psychiatric  History: Denies Social History:  Social History   Substance and Sexual Activity  Alcohol Use No     Social History   Substance and Sexual Activity  Drug Use No    Social History   Socioeconomic History  . Marital status: Single    Spouse name: Not on file  . Number of children: Not on file  . Years of education: Not on file  . Highest education level: Not on file  Occupational History  . Occupation: Disabled  Social Needs  . Financial resource strain: Not on file  . Food insecurity    Worry: Not on file    Inability: Not on file  . Transportation needs    Medical: Not on file    Non-medical: Not on file  Tobacco Use  . Smoking status: Former Smoker    Types: Cigarettes  . Smokeless tobacco: Never Used  Substance and Sexual Activity  . Alcohol use: No  . Drug use: No  . Sexual activity: Never  Lifestyle  . Physical activity    Days per week: Not on file     Minutes per session: Not on file  . Stress: Not on file  Relationships  . Social Herbalist on phone: Not on file    Gets together: Not on file    Attends religious service: Not on file    Active member of club or organization: Not on file    Attends meetings of clubs or organizations: Not on file    Relationship status: Not on file  Other Topics Concern  . Not on file  Social History Narrative   ** Merged History Encounter **       Additional Social History:    Allergies:  No Known Allergies  Labs:  Results for orders placed or performed during the hospital encounter of 08/29/18 (from the past 48 hour(s))  CBC     Status: None   Collection Time: 08/29/18  6:54 PM  Result Value Ref Range   WBC 6.6 4.0 - 10.5 K/uL   RBC 4.44 4.22 - 5.81 MIL/uL   Hemoglobin 13.2 13.0 - 17.0 g/dL   HCT 40.5 39.0 - 52.0 %   MCV 91.2 80.0 - 100.0 fL   MCH 29.7 26.0 - 34.0 pg   MCHC 32.6 30.0 - 36.0 g/dL   RDW 14.6 11.5 - 15.5 %   Platelets 180 150 - 400 K/uL   nRBC 0.0 0.0 - 0.2 %    Comment: Performed at [facility], [address] [place], [place] [address]  Ethanol     Status: None   Collection Time: 08/29/18  6:54 PM  Result Value Ref Range   Alcohol, Ethyl (B) <10 <10 mg/dL    Comment: (NOTE) Lowest detectable limit for serum alcohol is 10 mg/dL. For medical purposes only. Performed at [facility], [address] [place], [place] [address]   Acetaminophen level     Status: Abnormal   Collection Time: 08/29/18  6:54 PM  Result Value Ref Range   Acetaminophen (Tylenol), Serum <10 (L) 10 - 30 ug/mL    Comment: (NOTE) Therapeutic concentrations vary significantly. A range of 10-30 ug/mL  may be an effective concentration for many patients. However, some  are best treated at concentrations outside of this range. Acetaminophen concentrations >150 ug/mL at 4 hours after ingestion  and >50 ug/mL at 12 hours after ingestion are often associated with   toxic reactions. Performed at [facility], [address] [place], [place] [address]   Comprehensive metabolic panel     Status: Abnormal   Collection Time: 08/29/18  6:54 PM  Result Value Ref Range   Sodium 142 135 - 145 mmol/L   Potassium 4.1 3.5 - 5.1 mmol/L   Chloride 105 98 - 111 mmol/L   CO2 29 22 - 32 mmol/L   Glucose, Bld 136 (H) 70 - 99 mg/dL   BUN 19 6 - 20 mg/dL   Creatinine, Ser 1.00  0.61 - 1.24 mg/dL   Calcium 9.9 8.9 - 10.3 mg/dL   Total Protein 8.0 6.5 - 8.1 g/dL   Albumin 4.3 3.5 - 5.0 g/dL   AST 19 15 - 41 U/L   ALT 16 0 - 44 U/L   Alkaline Phosphatase 88 38 - 126 U/L   Total Bilirubin 0.4 0.3 - 1.2 mg/dL   GFR calc non Af Amer >60 >60 mL/min   GFR calc Af Amer >60 >60 mL/min   Anion gap 8 5 - 15    Comment: Performed at [facility], [address] [place], [place] [address]  Urinalysis, Complete w Microscopic     Status: Abnormal   Collection Time: 08/29/18  6:54 PM  Result Value Ref Range   Color, Urine YELLOW (A) YELLOW   APPearance CLEAR (A) CLEAR   Specific Gravity, Urine 1.018 1.005 - 1.030   pH 6.0 5.0 - 8.0   Glucose, UA NEGATIVE NEGATIVE mg/dL   Hgb urine dipstick NEGATIVE NEGATIVE   Bilirubin Urine NEGATIVE NEGATIVE   Ketones, ur 5 (A) NEGATIVE mg/dL   Protein, ur NEGATIVE NEGATIVE mg/dL   Nitrite NEGATIVE NEGATIVE   Leukocytes,Ua NEGATIVE NEGATIVE   RBC / HPF 0-5 0 - 5 RBC/hpf   WBC, UA NONE SEEN 0 - 5 WBC/hpf   Bacteria, UA NONE SEEN NONE SEEN   Squamous Epithelial / LPF 0-5 0 - 5   Mucus PRESENT    Hyaline Casts, UA PRESENT     Comment: Performed at [facility], [address], [place], [place] [address]  Urine Drug Screen, Qualitative ([facility] only)     Status: None   Collection Time: 08/29/18  6:54 PM  Result Value Ref Range   Tricyclic, Ur Screen NONE DETECTED NONE DETECTED   Amphetamines, Ur Screen NONE DETECTED NONE DETECTED   MDMA (Ecstasy)Ur Screen NONE DETECTED NONE DETECTED   Cocaine  Metabolite,Ur [place] NONE DETECTED NONE DETECTED   Opiate, Ur Screen NONE DETECTED NONE DETECTED   Phencyclidine (PCP) Ur S NONE DETECTED NONE DETECTED   Cannabinoid 50 Ng, Ur [place] NONE DETECTED NONE DETECTED   Barbiturates, Ur Screen NONE DETECTED NONE DETECTED   Benzodiazepine, Ur Scrn NONE DETECTED NONE DETECTED   Methadone Scn, Ur NONE DETECTED NONE DETECTED    Comment: (NOTE) Tricyclics + metabolites, urine    Cutoff 1000 ng/mL Amphetamines + metabolites, urine  Cutoff 1000 ng/mL MDMA (Ecstasy), urine              Cutoff 500 ng/mL Cocaine Metabolite, urine          Cutoff 300 ng/mL Opiate + metabolites, urine        Cutoff 300 ng/mL Phencyclidine (PCP), urine         Cutoff 25 ng/mL Cannabinoid, urine                 Cutoff 50 ng/mL Barbiturates + metabolites, urine  Cutoff 200 ng/mL Benzodiazepine, urine              Cutoff 200 ng/mL Methadone, urine                   Cutoff 300 ng/mL The urine drug screen provides only a preliminary, unconfirmed analytical test result and should not be used for non-medical purposes. Clinical consideration and professional judgment should be applied to any positive drug screen result due to possible interfering substances. A more specific alternate chemical method must be used in order to obtain a confirmed analytical  result. Gas chromatography / mass spectrometry (GC/MS) is the preferred confirmat ory method. Performed at [facility], [address] [place], [place] [address]   Salicylate level     Status: None   Collection Time: 08/29/18  6:54 PM  Result Value Ref Range   Salicylate Lvl <4.3 2.8 - 30.0 mg/dL    Comment: Performed at [facility], [address] [place], [place] [address]    Current Facility-Administered Medications  Medication Dose Route Frequency Provider Last Rate Last Dose  . acetaminophen (TYLENOL) tablet 500 mg  500 mg Oral Q6H PRN Arta Silence, MD      . benztropine (COGENTIN) tablet 1 mg   1 mg Oral BID Arta Silence, MD   1 mg at 08/29/18 2152  . clopidogrel (PLAVIX) tablet 75 mg  75 mg Oral Daily Arta Silence, MD      . cloZAPine (CLOZARIL) tablet 100 mg  100 mg Oral TID Arta Silence, MD   100 mg at 08/29/18 2153  . diphenhydrAMINE (BENADRYL) capsule 50 mg  50 mg Oral Q6H PRN Arta Silence, MD   50 mg at 08/29/18 2152  . divalproex (DEPAKOTE) DR tablet 500 mg  500 mg Oral BID Arta Silence, MD   500 mg at 08/29/18 2153  . haloperidol (HALDOL) tablet 5 mg  5 mg Oral Daily Arta Silence, MD   5 mg at 08/29/18 2204  . LORazepam (ATIVAN) tablet 1 mg  1 mg Oral Daily Arta Silence, MD   1 mg at 08/29/18 2204  . Resource ThickenUp Clear 120 g  1 Can Oral PRN Arta Silence, MD      . traZODone (DESYREL) tablet 100 mg  100 mg Oral QHS Arta Silence, MD   100 mg at 08/29/18 2153   Current Outpatient Medications  Medication Sig Dispense Refill  . acetaminophen (TYLENOL) 500 MG tablet Take 500 mg by mouth every 6 (six) hours as needed for mild pain.    . benztropine (COGENTIN) 1 MG tablet Take 1 mg by mouth 2 (two) times daily.    . clopidogrel (PLAVIX) 75 MG tablet Take 1 tablet (75 mg total) by mouth daily. 30 tablet 0  . cloZAPine (CLOZARIL) 100 MG tablet Take 1.5 tablets (150 mg total) by mouth 3 (three) times daily. At 8 AM, 5PM, 8PM per Dr. Darleene Cleaver (Patient taking differently: Take 100-200 mg by mouth 3 (three) times daily. One tablet (100 mg) At 8 AM, and 2PM, two tablets (200 mg) at bedtime per Dr. Darleene Cleaver)    . clozapine (CLOZARIL) 50 MG tablet Take 50 mg by mouth 2 (two) times daily. At 8 am and 2 pm    . diphenhydrAMINE (BENADRYL) 50 MG capsule Take 50 mg by mouth every 6 (six) hours as needed.    . divalproex (DEPAKOTE) 500 MG DR tablet Take 1 tablet (500 mg total) by mouth 2 (two) times daily. 30 tablet 0  . haloperidol (HALDOL) 5 MG tablet Take 5 mg by mouth daily. As needed for severe agitation    . traZODone (DESYREL)  100 MG tablet Take 100 mg by mouth at bedtime.    Marland Kitchen LORazepam (ATIVAN) 1 MG tablet Take 1 mg by mouth daily. As needed for severe agitation    . Maltodextrin-Xanthan Gum (RESOURCE THICKENUP CLEAR) POWD Take 120 g by mouth as needed. (Patient not taking: Reported on 04/07/2018) 1 Can 1    Musculoskeletal: Strength & Muscle Tone: decreased Gait & Station: normal Patient leans: N/A  Psychiatric Specialty  Exam: Physical Exam  Nursing note and vitals reviewed. Constitutional: He is oriented to person, place, and time. He appears well-developed and well-nourished.  Respiratory: Effort normal.  Musculoskeletal: Normal range of motion.  Neurological: He is alert and oriented to person, place, and time.  Skin: Skin is warm. Laceration (right hand) noted.    Review of Systems  Constitutional: Negative.   HENT: Negative.   Eyes: Negative.   Respiratory: Negative.   Cardiovascular: Negative.   Gastrointestinal: Negative.   Genitourinary: Negative.   Musculoskeletal: Negative.   Skin: Negative.        Right hand has sutures  Neurological: Negative.   Endo/Heme/Allergies: Negative.   Psychiatric/Behavioral: Negative.     Blood pressure (!) 113/99, pulse (!) 120, temperature 97.8 F (36.6 C), temperature source Oral, resp. rate 18, SpO2 98 %.There is no height or weight on file to calculate BMI.  General Appearance: Casual  Eye Contact:  Good  Speech:  Garbled  Volume:  Decreased  Mood:  Euthymic  Affect:  Congruent  Thought Process:  Linear and Descriptions of Associations: Intact  Orientation:  Full (Time, Place, and Person)  Thought Content:  Denies any abnormal thoughts  Suicidal Thoughts:  No  Homicidal Thoughts:  No  Memory:  Immediate;   Good Recent;   Fair Remote;   Good  Judgement:  Impaired  Insight:  Lacking  Psychomotor Activity:  Decreased  Concentration:  Concentration: Fair  Recall:  Good  Fund of Knowledge:  Poor  Language:  Poor  Akathisia:  No  Handed:   Right  AIMS (if indicated):     Assets:  Desire for Improvement Financial Resources/Insurance Housing Resilience Social Support Transportation  ADL's:  Intact  Cognition:  WNL  Sleep:        Treatment Plan Summary: Return to group home  Continue current home medications    Disposition: No evidence of imminent risk to self or others at present.   Patient does not meet criteria for psychiatric inpatient admission. Supportive therapy provided about ongoing stressors. Discussed crisis plan, support from social network, calling 911, coming to the Emergency Department, and calling Suicide Hotline.  [place], [name] 08/30/2018 10:07 AM

## 2018-08-30 NOTE — ED Notes (Signed)
Pt given peanut butter and graham crackers  

## 2018-08-30 NOTE — ED Notes (Addendum)
Group home called again at this time, states they will not be here until 2:30 or 3pm . This RN informed that pt is discharged and needs ride as soon as possible.

## 2018-08-30 NOTE — ED Provider Notes (Signed)
-----------------------------------------   4:53 AM on 08/30/2018 -----------------------------------------  Patient has been seen by psychiatry.  They believe the patient is safe for discharge back to his group facility.  Patient will be discharged in the morning once transport is available.   Harvest Dark, MD 08/30/18 [phone]

## 2018-08-30 NOTE — ED Notes (Signed)
Pt given meal tray but continues to sleep.

## 2018-08-31 ENCOUNTER — Other Ambulatory Visit: Payer: Self-pay

## 2018-08-31 ENCOUNTER — Emergency Department
Admission: EM | Admit: 2018-08-31 | Discharge: 2018-08-31 | Disposition: A | Discharge status: Other Acute Inpatient Hospital | Payer: Medicaid Other | Attending: Emergency Medicine | Admitting: Emergency Medicine

## 2018-08-31 ENCOUNTER — Emergency Department: Payer: Medicaid Other

## 2018-08-31 ENCOUNTER — Inpatient Hospital Stay
Admission: AD | Admit: 2018-08-31 | Discharge: 2018-10-04 | DRG: 885 | Disposition: A | Discharge status: Home / Self Care | Payer: Medicaid Other | Source: Intra-hospital | Attending: Psychiatry | Admitting: Psychiatry

## 2018-08-31 ENCOUNTER — Encounter: Payer: Self-pay | Admitting: Emergency Medicine

## 2018-08-31 DIAGNOSIS — K219 Gastro-esophageal reflux disease without esophagitis: Secondary | ICD-10-CM | POA: Diagnosis present

## 2018-08-31 DIAGNOSIS — I739 Peripheral vascular disease, unspecified: Secondary | ICD-10-CM | POA: Diagnosis present

## 2018-08-31 DIAGNOSIS — E43 Unspecified severe protein-calorie malnutrition: Secondary | ICD-10-CM | POA: Diagnosis present

## 2018-08-31 DIAGNOSIS — Z046 Encounter for general psychiatric examination, requested by authority: Secondary | ICD-10-CM | POA: Diagnosis present

## 2018-08-31 DIAGNOSIS — Z87891 Personal history of nicotine dependence: Secondary | ICD-10-CM | POA: Diagnosis not present

## 2018-08-31 DIAGNOSIS — F71 Moderate intellectual disabilities: Secondary | ICD-10-CM | POA: Diagnosis present

## 2018-08-31 DIAGNOSIS — Z79899 Other long term (current) drug therapy: Secondary | ICD-10-CM | POA: Diagnosis not present

## 2018-08-31 DIAGNOSIS — F250 Schizoaffective disorder, bipolar type: Secondary | ICD-10-CM | POA: Diagnosis present

## 2018-08-31 DIAGNOSIS — Z7902 Long term (current) use of antithrombotics/antiplatelets: Secondary | ICD-10-CM | POA: Insufficient documentation

## 2018-08-31 DIAGNOSIS — R1312 Dysphagia, oropharyngeal phase: Secondary | ICD-10-CM | POA: Diagnosis present

## 2018-08-31 DIAGNOSIS — Z20828 Contact with and (suspected) exposure to other viral communicable diseases: Secondary | ICD-10-CM | POA: Insufficient documentation

## 2018-08-31 DIAGNOSIS — I10 Essential (primary) hypertension: Secondary | ICD-10-CM | POA: Insufficient documentation

## 2018-08-31 DIAGNOSIS — F259 Schizoaffective disorder, unspecified: Secondary | ICD-10-CM | POA: Diagnosis not present

## 2018-08-31 DIAGNOSIS — T1491XA Suicide attempt, initial encounter: Secondary | ICD-10-CM

## 2018-08-31 DIAGNOSIS — R1310 Dysphagia, unspecified: Secondary | ICD-10-CM

## 2018-08-31 DIAGNOSIS — G4700 Insomnia, unspecified: Secondary | ICD-10-CM | POA: Diagnosis present

## 2018-08-31 DIAGNOSIS — F29 Unspecified psychosis not due to a substance or known physiological condition: Secondary | ICD-10-CM | POA: Diagnosis present

## 2018-08-31 LAB — URINE DRUG SCREEN, QUALITATIVE (ARMC ONLY)
Amphetamines, Ur Screen: NOT DETECTED
Barbiturates, Ur Screen: NOT DETECTED
Benzodiazepine, Ur Scrn: NOT DETECTED
Cannabinoid 50 Ng, Ur ~~LOC~~: NOT DETECTED
Cocaine Metabolite,Ur ~~LOC~~: NOT DETECTED
MDMA (Ecstasy)Ur Screen: NOT DETECTED
Methadone Scn, Ur: NOT DETECTED
Opiate, Ur Screen: NOT DETECTED
Phencyclidine (PCP) Ur S: NOT DETECTED
Tricyclic, Ur Screen: NOT DETECTED

## 2018-08-31 LAB — COMPREHENSIVE METABOLIC PANEL
ALT: 14 U/L (ref 0–44)
AST: 18 U/L (ref 15–41)
Albumin: 4 g/dL (ref 3.5–5.0)
Alkaline Phosphatase: 82 U/L (ref 38–126)
Anion gap: 8 (ref 5–15)
BUN: 19 mg/dL (ref 6–20)
CO2: 27 mmol/L (ref 22–32)
Calcium: 9.6 mg/dL (ref 8.9–10.3)
Chloride: 105 mmol/L (ref 98–111)
Creatinine, Ser: 1.2 mg/dL (ref 0.61–1.24)
GFR calc Af Amer: >60 mL/min (ref >60)
GFR calc non Af Amer: >60 mL/min (ref >60)
Glucose, Bld: 82 mg/dL (ref 70–99)
Potassium: 4.6 mmol/L (ref 3.5–5.1)
Sodium: 140 mmol/L (ref 135–145)
Total Bilirubin: 0.3 mg/dL (ref 0.3–1.2)
Total Protein: 7.7 g/dL (ref 6.5–8.1)

## 2018-08-31 LAB — CBC WITH DIFFERENTIAL/PLATELET
Abs Immature Granulocytes: 0.01 10*3/uL (ref 0.00–0.07)
Basophils Absolute: 0 10*3/uL (ref 0.0–0.1)
Basophils Relative: 0 %
Eosinophils Absolute: 0 10*3/uL (ref 0.0–0.5)
Eosinophils Relative: 0 %
HCT: 39.2 % (ref 39.0–52.0)
Hemoglobin: 12.6 g/dL — ABNORMAL LOW (ref 13.0–17.0)
Immature Granulocytes: 0 %
Lymphocytes Relative: 15 %
Lymphs Abs: 0.7 10*3/uL (ref 0.7–4.0)
MCH: 29.5 pg (ref 26.0–34.0)
MCHC: 32.1 g/dL (ref 30.0–36.0)
MCV: 91.8 fL (ref 80.0–100.0)
Monocytes Absolute: 0.3 10*3/uL (ref 0.1–1.0)
Monocytes Relative: 6 %
Neutro Abs: 3.6 10*3/uL (ref 1.7–7.7)
Neutrophils Relative %: 79 %
Platelets: 207 10*3/uL (ref 150–400)
RBC: 4.27 MIL/uL (ref 4.22–5.81)
RDW: 14.8 % (ref 11.5–15.5)
WBC: 4.5 10*3/uL (ref 4.0–10.5)
nRBC: 0 % (ref 0.0–0.2)

## 2018-08-31 LAB — URINALYSIS, COMPLETE (UACMP) WITH MICROSCOPIC
Bacteria, UA: NONE SEEN
Bilirubin Urine: NEGATIVE
Glucose, UA: NEGATIVE mg/dL
Hgb urine dipstick: NEGATIVE
Ketones, ur: NEGATIVE mg/dL
Leukocytes,Ua: NEGATIVE
Nitrite: NEGATIVE
Protein, ur: 30 mg/dL — AB
Specific Gravity, Urine: 1.021 (ref 1.005–1.030)
pH: 6 (ref 5.0–8.0)

## 2018-08-31 LAB — SALICYLATE LEVEL: Salicylate Lvl: <7 mg/dL (ref 2.8–30.0)

## 2018-08-31 LAB — ETHANOL: Alcohol, Ethyl (B): <10 mg/dL (ref <10)

## 2018-08-31 LAB — ACETAMINOPHEN LEVEL: Acetaminophen (Tylenol), Serum: <10 ug/mL — ABNORMAL LOW (ref 10–30)

## 2018-08-31 LAB — SARS CORONAVIRUS 2 BY RT PCR (HOSPITAL ORDER, PERFORMED IN ~~LOC~~ HOSPITAL LAB): SARS Coronavirus 2: NEGATIVE

## 2018-08-31 MED ORDER — CLOZAPINE 100 MG PO TABS
200.0000 mg | ORAL_TABLET | Freq: Every day | ORAL | Status: DC
  Administered 2018-08-31 – 2018-09-01 (×2): 200 mg via ORAL
  Filled 2018-08-31 (×2): qty 2

## 2018-08-31 MED ORDER — BENZTROPINE MESYLATE 1 MG PO TABS
1.0000 mg | ORAL_TABLET | Freq: Two times a day (BID) | ORAL | Status: DC
  Administered 2018-08-31 – 2018-09-03 (×6): 1 mg via ORAL
  Filled 2018-08-31 (×6): qty 1

## 2018-08-31 MED ORDER — TRAZODONE HCL 100 MG PO TABS
100.0000 mg | ORAL_TABLET | Freq: Every day | ORAL | Status: DC
  Administered 2018-08-31: 23:00:00 100 mg via ORAL
  Filled 2018-08-31: qty 1

## 2018-08-31 MED ORDER — ALUM & MAG HYDROXIDE-SIMETH 200-200-20 MG/5ML PO SUSP
30.0000 mL | ORAL | Status: DC | PRN
  Administered 2018-09-05 – 2018-10-01 (×9): 30 mL via ORAL
  Filled 2018-08-31 (×9): qty 30

## 2018-08-31 MED ORDER — CLOPIDOGREL BISULFATE 75 MG PO TABS
75.0000 mg | ORAL_TABLET | Freq: Every day | ORAL | Status: DC
  Administered 2018-08-31: 75 mg via ORAL
  Filled 2018-08-31: qty 1

## 2018-08-31 MED ORDER — CLOZAPINE 25 MG PO TABS: 150.0000 mg | ORAL_TABLET | Freq: Two times a day (BID) | ORAL | Status: DC

## 2018-08-31 MED ORDER — CLOZAPINE 25 MG PO TABS: 100.0000 mg | ORAL_TABLET | Freq: Three times a day (TID) | ORAL | Status: DC

## 2018-08-31 MED ORDER — DIVALPROEX SODIUM 500 MG PO DR TAB: 500.0000 mg | DELAYED_RELEASE_TABLET | Freq: Two times a day (BID) | ORAL | Status: DC

## 2018-08-31 MED ORDER — CLOPIDOGREL BISULFATE 75 MG PO TABS
75.0000 mg | ORAL_TABLET | Freq: Every day | ORAL | Status: DC
  Administered 2018-09-01 – 2018-10-04 (×34): 75 mg via ORAL
  Filled 2018-08-31 (×34): qty 1

## 2018-08-31 MED ORDER — DIVALPROEX SODIUM 500 MG PO DR TAB
500.0000 mg | DELAYED_RELEASE_TABLET | Freq: Two times a day (BID) | ORAL | Status: DC
  Administered 2018-08-31 – 2018-09-14 (×28): 500 mg via ORAL
  Filled 2018-08-31 (×28): qty 1

## 2018-08-31 MED ORDER — CLOZAPINE 25 MG PO TABS: 50.0000 mg | ORAL_TABLET | Freq: Two times a day (BID) | ORAL | Status: DC

## 2018-08-31 MED ORDER — BENZTROPINE MESYLATE 1 MG PO TABS: 1.0000 mg | ORAL_TABLET | Freq: Two times a day (BID) | ORAL | Status: DC

## 2018-08-31 MED ORDER — MAGNESIUM HYDROXIDE 400 MG/5ML PO SUSP
30.0000 mL | Freq: Every day | ORAL | Status: DC | PRN
  Administered 2018-08-31 – 2018-09-11 (×2): 30 mL via ORAL
  Filled 2018-08-31 (×2): qty 30

## 2018-08-31 MED ORDER — ACETAMINOPHEN 325 MG PO TABS
650.0000 mg | ORAL_TABLET | Freq: Four times a day (QID) | ORAL | Status: DC | PRN
  Administered 2018-09-09: 13:00:00 650 mg via ORAL
  Filled 2018-08-31: qty 2

## 2018-08-31 MED ORDER — TRAZODONE HCL 100 MG PO TABS: 100.0000 mg | ORAL_TABLET | Freq: Every day | ORAL | Status: DC

## 2018-08-31 MED ORDER — CLOZAPINE 25 MG PO TABS: 200.0000 mg | ORAL_TABLET | Freq: Every day | ORAL | Status: DC

## 2018-08-31 NOTE — BH Assessment (Signed)
Patient to be admitted to [facility] by Psych Nurse Practitioner, Waylan Boga. Attending Physician will be Dr. Weber Cooks.   Patient has been assigned to room 302, by [place].   ER staff is aware of the admission:  Lattie Haw, [place] Patient's Nurse   Marianna Fuss, Patient Access.  Patient sister is his legal guardian [name]-[phone]) and she's aware of him getting admitted to [facility].

## 2018-08-31 NOTE — ED Triage Notes (Signed)
Pt ems from care home for trying to jump out of a moving vehicle at [place] speed. Pt seen here 2 days ago for punching out a window.
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
## 2018-08-31 NOTE — ED Notes (Signed)
Pt belonging - 1 shirt, 1 tee shirt, 1 pants, 2 socks, 2 shoes, 1 belt - placed in bag and to bhu
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
## 2018-08-31 NOTE — Progress Notes (Signed)
Clozapine Monitoring Note  Pt ordered clozapine 150 mg at 8 am and 2 pm, and 200 mg at bedtime  7/10 ANC 3600  Lab reported to clozapine registry Pt is eligible to receive clozapine Next labs due in one week per hospital policy  Rayna Sexton, PharmD, BCPS Clinical Pharmacist 08/31/2018 9:05 PM
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
## 2018-08-31 NOTE — Tx Team (Signed)
Initial Treatment Plan 08/31/2018 11:35 PM [name] FFM:[id number]    PATIENT STRESSORS: Financial difficulties Health problems Medication change or noncompliance   PATIENT STRENGTHS: Motivation for treatment/growth Special hobby/interest Supportive family/friends   PATIENT IDENTIFIED PROBLEMS: Confused/disorganized    Intellectual disability    Lack direction and difficult to redirect             DISCHARGE CRITERIA:  Medical problems require only outpatient monitoring Motivation to continue treatment in a less acute level of care Need for constant or close observation no longer present  PRELIMINARY DISCHARGE PLAN: Attend PHP/IOP Attend 12-step recovery group Participate in family therapy Return to previous living arrangement  PATIENT/FAMILY INVOLVEMENT: This treatment plan has been presented to and reviewed with the patient, Austin Gates,   The patient  have been given the opportunity to ask questions and make suggestions.  Clemens Catholic, RN 08/31/2018, 11:35 PM

## 2018-08-31 NOTE — ED Notes (Signed)
Spoke with carolyn frazier, pts guardian, and updated her on pts condition.

## 2018-08-31 NOTE — ED Notes (Signed)
Gave report to [facility] RN. Pt may transfer downstairs after Covid swab. Swab has been transported to lab.   Pt is pleasant but requires constant redirection, as he keeps coming out of his room and repeatedly requesting snacks. Redirection is only momentarily successful. Will continue to monitor for needs/safety.

## 2018-08-31 NOTE — ED Notes (Signed)
ED pharmacist asked to verify meds. She sent NP text clarifying med doses and times.

## 2018-08-31 NOTE — BH Assessment (Signed)
Writer received a phone call from Hoberg Ria Comment [phone]) requesting information about patient and how he doing in the ER.

## 2018-08-31 NOTE — ED Notes (Signed)
[facility]  PENDING  GOING  TO  BEH  MED  TONIGHT

## 2018-08-31 NOTE — ED Notes (Signed)
[facility] PENDING  CONSULT ?

## 2018-09-01 DIAGNOSIS — F250 Schizoaffective disorder, bipolar type: Principal | ICD-10-CM

## 2018-09-01 LAB — VALPROIC ACID LEVEL: Valproic Acid Lvl: 26 ug/mL — ABNORMAL LOW (ref 50.0–100.0)

## 2018-09-01 MED ORDER — HALOPERIDOL 5 MG PO TABS
5.0000 mg | ORAL_TABLET | Freq: Four times a day (QID) | ORAL | Status: DC | PRN
  Administered 2018-09-01 – 2018-09-15 (×7): 5 mg via ORAL
  Filled 2018-09-01 (×7): qty 1

## 2018-09-01 MED ORDER — TRAZODONE HCL 100 MG PO TABS
100.0000 mg | ORAL_TABLET | Freq: Two times a day (BID) | ORAL | Status: DC
  Administered 2018-09-01: 17:00:00 100 mg via ORAL
  Filled 2018-09-01: qty 1

## 2018-09-01 MED ORDER — HALOPERIDOL LACTATE 5 MG/ML IJ SOLN
10.0000 mg | Freq: Four times a day (QID) | INTRAMUSCULAR | Status: DC | PRN
  Filled 2018-09-01 (×2): qty 2

## 2018-09-01 MED ORDER — LORAZEPAM 2 MG/ML IJ SOLN
2.0000 mg | INTRAMUSCULAR | Status: DC | PRN
  Administered 2018-09-03: 2 mg via INTRAMUSCULAR
  Filled 2018-09-01 (×3): qty 1

## 2018-09-01 MED ORDER — PANTOPRAZOLE SODIUM 40 MG PO TBEC
40.0000 mg | DELAYED_RELEASE_TABLET | Freq: Two times a day (BID) | ORAL | Status: DC
  Administered 2018-09-01 – 2018-10-04 (×67): 40 mg via ORAL
  Filled 2018-09-01 (×68): qty 1

## 2018-09-01 MED ORDER — CLONAZEPAM 0.5 MG PO TABS: 0.5000 mg | ORAL_TABLET | Freq: Three times a day (TID) | ORAL | Status: DC

## 2018-09-01 MED ORDER — LORAZEPAM 1 MG PO TABS
1.0000 mg | ORAL_TABLET | Freq: Two times a day (BID) | ORAL | Status: DC
  Administered 2018-09-01 – 2018-10-04 (×68): 1 mg via ORAL
  Filled 2018-09-01 (×37): qty 1
  Filled 2018-09-01: qty 2
  Filled 2018-09-01 (×28): qty 1

## 2018-09-01 MED ORDER — TEMAZEPAM 15 MG PO CAPS
45.0000 mg | ORAL_CAPSULE | Freq: Every day | ORAL | Status: DC
  Administered 2018-09-01: 21:00:00 45 mg via ORAL
  Filled 2018-09-01: qty 3

## 2018-09-01 MED ORDER — LORAZEPAM 1 MG PO TABS
1.0000 mg | ORAL_TABLET | Freq: Four times a day (QID) | ORAL | Status: DC | PRN
  Administered 2018-09-01 – 2018-09-20 (×8): 1 mg via ORAL
  Filled 2018-09-01 (×12): qty 1
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
## 2018-09-01 NOTE — BHH Group Notes (Signed)
LCSW Group Therapy Note   09/01/2018 1:15pm   Type of Therapy and Topic:  Group Therapy:  Trust and Honesty  Participation Level:  Did Not Attend  Description of Group:    In this group patients will be asked to explore the value of being honest.  Patients will be guided to discuss their thoughts, feelings, and behaviors related to honesty and trusting in others. Patients will process together how trust and honesty relate to forming relationships with peers, family members, and self. Each patient will be challenged to identify and express feelings of being vulnerable. Patients will discuss reasons why people are dishonest and identify alternative outcomes if one was truthful (to self or others). This group will be process-oriented, with patients participating in exploration of their own experiences, giving and receiving support, and processing challenge from other group members.   Therapeutic Goals: 1. Patient will identify why honesty is important to relationships and how honesty overall affects relationships.  2. Patient will identify a situation where they lied or were lied too and the  feelings, thought process, and behaviors surrounding the situation 3. Patient will identify the meaning of being vulnerable, how that feels, and how that correlates to being honest with self and others. 4. Patient will identify situations where they could have told the truth, but instead lied and explain reasons of dishonesty.   Summary of Patient Progress Pt was invited to attend group but chose not to attend. CSW will continue to encourage pt to attend group throughout their admission.     Therapeutic Modalities:   Cognitive Behavioral Therapy Solution Focused Therapy Motivational Interviewing Brief Therapy  Myka Lukins  CUEBAS-COLON, LCSW 09/01/2018 12:46 PM

## 2018-09-01 NOTE — Plan of Care (Signed)
Pt rates depression and anxiety 10/10. Pt says he has SI but denies HI and AVH. Pt was educated on care plan and verbalizes understanding. Pt is difficult to redirect but it in the day room watching tv. He did change into fresh scrubs, [name] RN Problem: Activity: Goal: Will identify at least one activity in which they can participate Outcome: Progressing   Problem: Coping: Goal: Ability to identify and develop effective coping behavior will improve Outcome: Progressing Goal: Ability to interact with others will improve Outcome: Progressing Goal: Demonstration of participation in decision-making regarding own care will improve Outcome: Not Progressing Goal: Ability to use eye contact when communicating with others will improve Outcome: Progressing   Problem: Health Behavior/Discharge Planning: Goal: Identification of resources available to assist in meeting health care needs will improve Outcome: Not Progressing   Problem: Self-Concept: Goal: Will verbalize positive feelings about self Outcome: Progressing   Problem: Education: Goal: Utilization of techniques to improve thought processes will improve Outcome: Not Progressing Goal: Knowledge of the prescribed therapeutic regimen will improve Outcome: Progressing   Problem: Activity: Goal: Interest or engagement in leisure activities will improve Outcome: Progressing Goal: Imbalance in normal sleep/wake cycle will improve Outcome: Not Progressing   Problem: Coping: Goal: Coping ability will improve Outcome: Not Progressing Goal: Will verbalize feelings Outcome: Progressing

## 2018-09-01 NOTE — Progress Notes (Signed)
Pt wet the bed during his nap. When speaking with his guardian on the phone she says that he does that often.I changed his linens and he took a shower.  [name] RN

## 2018-09-01 NOTE — Progress Notes (Signed)
D: Patient has been sleeping at beginning of shift, then woke up for snack. Needy and attention seeking. Speech is slurred and difficult to understand. Denies SI, HI and AVH. Shuffling gait. Has difficulty opening containers and needs assistance.  A: Continue to monitor for safety. R:  Safety maintained.

## 2018-09-01 NOTE — Progress Notes (Signed)
Patient is still up confused and constantly asking  for food, patient has been fed twice since admission and given fluids , support and therapeutic education is provided.
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
## 2018-09-01 NOTE — Plan of Care (Signed)
  Problem: Activity: Goal: Will identify at least one activity in which they can participate Outcome: Not Progressing  D: Patient has been sleeping at beginning of shift, then woke up for snack. Needy and attention seeking. Speech is slurred and difficult to understand. Denies SI, HI and AVH. Shuffling gait. Has difficulty opening containers and needs assistance.  A: Continue to monitor for safety. R:  Safety maintained.

## 2018-09-02 MED ORDER — CLOZAPINE 25 MG PO TABS
50.0000 mg | ORAL_TABLET | Freq: Two times a day (BID) | ORAL | Status: DC
  Administered 2018-09-02 – 2018-09-03 (×3): 50 mg via ORAL
  Filled 2018-09-02 (×3): qty 2

## 2018-09-02 MED ORDER — TRAZODONE HCL 50 MG PO TABS
50.0000 mg | ORAL_TABLET | Freq: Two times a day (BID) | ORAL | Status: DC
  Administered 2018-09-02 – 2018-09-03 (×2): 50 mg via ORAL
  Filled 2018-09-02 (×2): qty 1

## 2018-09-02 MED ORDER — TEMAZEPAM 15 MG PO CAPS
30.0000 mg | ORAL_CAPSULE | Freq: Every day | ORAL | Status: DC
  Administered 2018-09-02: 22:00:00 30 mg via ORAL
  Filled 2018-09-02: qty 2

## 2018-09-02 MED ORDER — CLOZAPINE 100 MG PO TABS
300.0000 mg | ORAL_TABLET | Freq: Every day | ORAL | Status: DC
  Administered 2018-09-02 – 2018-09-12 (×11): 300 mg via ORAL
  Filled 2018-09-02 (×11): qty 3

## 2018-09-02 MED ORDER — GABAPENTIN 300 MG PO CAPS
300.0000 mg | ORAL_CAPSULE | Freq: Three times a day (TID) | ORAL | Status: DC
  Administered 2018-09-02 – 2018-10-04 (×95): 300 mg via ORAL
  Filled 2018-09-02 (×13): qty 1
  Filled 2018-09-02: qty 3
  Filled 2018-09-02 (×2): qty 1
  Filled 2018-09-02: qty 3
  Filled 2018-09-02 (×35): qty 1
  Filled 2018-09-02: qty 3
  Filled 2018-09-02 (×43): qty 1

## 2018-09-02 MED ORDER — LORAZEPAM 2 MG PO TABS
2.0000 mg | ORAL_TABLET | Freq: Once | ORAL | Status: AC
  Administered 2018-09-02: 2 mg via ORAL
  Filled 2018-09-02: qty 1

## 2018-09-02 NOTE — Progress Notes (Signed)
Patient has been obsessed with graham crackers and peanut butter. Patient has come up to this writer as well as other staff all day long, right after meals stating that "I'm hungry, I want some peanut butter, graham crackers, and something to drink".

## 2018-09-02 NOTE — Plan of Care (Signed)
D- Patient alert and orientedx3. Patient presents in a pleasant mood on assessment stating that he slept "real good" last night and had no complaints to voice to this Probation officer. Patient's speech is slurred and is also incoherent at times. From what this writer could gather from patient, he denies SI, HI, AVH, and pain.  Patient also denies any signs/symptoms of depression or anxiety. Patient had no stated goals for today.  A- Scheduled medications administered to patient, per MD orders. Support and encouragement provided.  Routine safety checks conducted every 15 minutes.  Patient informed to notify staff with problems or concerns.  R- No adverse drug reactions noted. Patient contracts for safety at this time. Patient compliant with medications and treatment plan. Patient receptive, calm, and cooperative. Patient interacts well with others on the unit.  Patient remains safe at this time.  Problem: Activity: Goal: Will identify at least one activity in which they can participate Outcome: Progressing   Problem: Coping: Goal: Ability to identify and develop effective coping behavior will improve Outcome: Progressing Goal: Ability to interact with others will improve Outcome: Progressing Goal: Demonstration of participation in decision-making regarding own care will improve Outcome: Progressing Goal: Ability to use eye contact when communicating with others will improve Outcome: Progressing   Problem: Health Behavior/Discharge Planning: Goal: Identification of resources available to assist in meeting health care needs will improve Outcome: Progressing   Problem: Self-Concept: Goal: Will verbalize positive feelings about self Outcome: Progressing   Problem: Education: Goal: Utilization of techniques to improve thought processes will improve Outcome: Progressing Goal: Knowledge of the prescribed therapeutic regimen will improve Outcome: Progressing   Problem: Activity: Goal: Interest or  engagement in leisure activities will improve Outcome: Progressing Goal: Imbalance in normal sleep/wake cycle will improve Outcome: Progressing   Problem: Coping: Goal: Coping ability will improve Outcome: Progressing Goal: Will verbalize feelings Outcome: Progressing   Problem: Health Behavior/Discharge Planning: Goal: Ability to make decisions will improve Outcome: Progressing Goal: Compliance with therapeutic regimen will improve Outcome: Progressing   Problem: Role Relationship: Goal: Will demonstrate positive changes in social behaviors and relationships Outcome: Progressing   Problem: Safety: Goal: Ability to disclose and discuss suicidal ideas will improve Outcome: Progressing Goal: Ability to identify and utilize support systems that promote safety will improve Outcome: Progressing   Problem: Self-Concept: Goal: Will verbalize positive feelings about self Outcome: Progressing Goal: Level of anxiety will decrease Outcome: Progressing   Problem: Education: Goal: Ability to verbalize precipitating factors for violent behavior will improve Outcome: Progressing   Problem: Coping: Goal: Ability to verbalize frustrations and anger appropriately will improve Outcome: Progressing   Problem: Health Behavior/Discharge Planning: Goal: Ability to implement measures to prevent violent behavior in the future will improve Outcome: Progressing   Problem: Safety: Goal: Ability to demonstrate self-control will improve Outcome: Progressing Goal: Ability to redirect hostility and anger into socially appropriate behaviors will improve Outcome: Progressing
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
## 2018-09-02 NOTE — Progress Notes (Addendum)
D: At 37, patient was out of bed walking in the hall and fell beside the double doors outside of the nurses station. Patient slid down to his bottom and landed in a seated position on the floor. Patient did not hit his head. Fall was witnessed by MHT who was going to see what the patient needed when he fell. Prior to fall, patient's gait appeared unsteady. Denies any complaints of pain, and is able to bear weight on lower extremities. Patient is restless, but knows his name and where he is. Continuously trying to get out of bed. Vital signs 97.6, 99, 20, 131/82, and O2 sat is 97% on room air. No sign of injury. Full range of motion in upper and lower extremities. Patient had a loose sounding cough, but is afebrile and is not in respiratory distress. Patient is developmentally delayed. Dr. Jake Samples notified and ordered 1:1 observation and Ativan 2 mg po x1 for restless agitation.  A: Monitor 1:1 observation for safety due to high fall risk. R: Safety maintained. 0140 Patient remains on 1:1 observation for safety and increased fall risk. Was offered fluids and assisted to bathroom.  Resting in bed, snoring. Safety maintained. 0240 Assisted with using the urinal. Patient resting in bed snoring.  Staff member within reach. Continues on 1:1 for safety. Safety maintained. 0340 Patient resting in bed snoring. Staff member within reach. Continues on 1:1 for safety. Safety maintained. 0440 Patient resting in bed snoring. Staff member within reach. Continues on 1:1 for safety. Safety maintained. 6387 Patient remains in bed, snoring. Staff member within reach. Continues on 1:1 for safety. Safety maintained. 0640 Patient remained in bed, snoring. Staff member within reach. Continues on 1:1 for safety. Safety maintained.

## 2018-09-02 NOTE — Progress Notes (Signed)
1:1 Patient Hourly Rounding  1000: Patient is in the dayroom, watching tv, with his assigned safety sitter present by his side.  1400: Patient is asleep, in bed, with his assigned safety sitter present at bedside.  1800: Patient is in the dayroom, watching tv, with his assigned safety sitter present at his side.

## 2018-09-02 NOTE — [facility] Group Notes (Signed)
LCSW Group Therapy Note 09/02/2018 1:15pm  Type of Therapy and Topic: Group Therapy: Feelings Around Returning Home & Establishing a Supportive Framework and Supporting Oneself When Supports Not Available  Participation Level: Did Not Attend  Description of Group:  Patients first processed thoughts and feelings about upcoming discharge. These included fears of upcoming changes, lack of change, new living environments, judgements and expectations from others and overall stigma of mental health issues. The group then discussed the definition of a supportive framework, what that looks and feels like, and how do to discern it from an unhealthy non-supportive network. The group identified different types of supports as well as what to do when your family/friends are less than helpful or unavailable  Therapeutic Goals  1. Patient will identify one healthy supportive network that they can use at discharge. 2. Patient will identify one factor of a supportive framework and how to tell it from an unhealthy network. 3. Patient able to identify one coping skill to use when they do not have positive supports from others. 4. Patient will demonstrate ability to communicate their needs through discussion and/or role plays.  Summary of Patient Progress:  Pt was invited to attend group but chose not to attend. CSW will continue to encourage pt to attend group throughout their admission.   Therapeutic Modalities Cognitive Behavioral Therapy Motivational Interviewing   Austin Gates  CUEBAS-COLON, LCSW 09/02/2018 3:40 PM

## 2018-09-02 NOTE — Progress Notes (Signed)
[facility] MD Progress Note  09/02/2018 9:19 AM Austin Gates  MRN:  [id number] Subjective:   Patient was described as attention seeking throughout the day somewhat volatile and in the night slid to the floor no injury related to this but it was charted as a fall.  The patient is sluggish oriented to person place situation when aroused without thoughts of harming self without thoughts of harming others still slurred speech pressured rambling and difficult to assess needs as he is so variable in his ramblings and attention seeking Principal Problem: Volatility at group home Diagnosis: Active Problems:   Schizoaffective disorder, bipolar type ([place])  Total Time spent with patient: 20 minutes  Past Psychiatric History: Long term clozapine patient  Past Medical History:  Past Medical History:  Diagnosis Date  . Hypertension   . Intellectual disability   . Obsessive-compulsive disorder   . Schizo-affective schizophrenia [facility])     Past Surgical History:  Procedure Laterality Date  . COLONOSCOPY WITH PROPOFOL N/A 12/28/2017   Procedure: COLONOSCOPY WITH PROPOFOL;  Surgeon: Jonathon Bellows, MD;  Location: [facility] ENDOSCOPY;  Service: Gastroenterology;  Laterality: N/A;  . HYDROCELE EXCISION Bilateral 02/22/2018   Procedure: bilateral HYDROCELECTOMY ADULT;  Surgeon: Cleon Gustin, MD;  Location: [place];  Service: Urology;  Laterality: Bilateral;  . INSERTION OF ILIAC STENT Left 02/22/2018   Procedure: INSERTION LEFT SUPERIOR FEMORAL ARTERY USING 6MM X 5CM VIABHON STENT with mynx device closure on right femoral artery;  Surgeon: Waynetta Sandy, MD;  Location: [place];  Service: Vascular;  Laterality: Left;  . KNEE ARTHROSCOPY    . SCROTAL EXPLORATION N/A 02/22/2018   Procedure: SCROTUM EXPLORATION;  Surgeon: Cleon Gustin, MD;  Location: [place];  Service: Urology;  Laterality: N/A;  . UPPER EXTREMITY ANGIOGRAM Left 02/22/2018   Procedure: left lower EXTREMITY ANGIOGRAM;  Surgeon: Waynetta Sandy, MD;  [facility];  Service: Vascular;  Laterality: Left;   Family History:  Family History  Problem Relation Age of Onset  . Diabetes Mother    Family Psychiatric  History: ukn Social History:  Social History   Substance and Sexual Activity  Alcohol Use No     Social History   Substance and Sexual Activity  Drug Use No    Social History   Socioeconomic History  . Marital status: Single    Spouse name: Not on file  . Number of children: Not on file  . Years of education: Not on file  . Highest education level: Not on file  Occupational History  . Occupation: Disabled  Social Needs  . Financial resource strain: Not on file  . Food insecurity    Worry: Not on file    Inability: Not on file  . Transportation needs    Medical: Not on file    Non-medical: Not on file  Tobacco Use  . Smoking status: Former Smoker    Types: Cigarettes  . Smokeless tobacco: Never Used  Substance and Sexual Activity  . Alcohol use: No  . Drug use: No  . Sexual activity: Never  Lifestyle  . Physical activity    Days per week: Not on file    Minutes per session: Not on file  . Stress: Not on file  Relationships  . Social Herbalist on phone: Not on file    Gets together: Not on file    Attends religious service: Not on file    Active member of club or organization: Not on file  Attends meetings of clubs or organizations: Not on file    Relationship status: Not on file  Other Topics Concern  . Not on file  Social History Narrative   ** Merged History Encounter **       Additional Social History:                         Sleep: Fair  Appetite:  Fair  Current Medications: Current Facility-Administered Medications  Medication Dose Route Frequency Provider Last Rate Last Dose  . acetaminophen (TYLENOL) tablet 650 mg  650 mg Oral Q6H PRN Patrecia Pour, NP      . alum & mag hydroxide-simeth (MAALOX/MYLANTA) 200-200-20 MG/5ML suspension 30  mL  30 mL Oral Q4H PRN Patrecia Pour, NP      . benztropine (COGENTIN) tablet 1 mg  1 mg Oral BID Patrecia Pour, NP   1 mg at 09/01/18 1643  . clopidogrel (PLAVIX) tablet 75 mg  75 mg Oral Daily Patrecia Pour, NP   75 mg at 09/01/18 0745  . cloZAPine (CLOZARIL) tablet 200 mg  200 mg Oral QHS Patrecia Pour, NP   200 mg at 09/01/18 2119  . divalproex (DEPAKOTE) DR tablet 500 mg  500 mg Oral BID Patrecia Pour, NP   500 mg at 09/01/18 1643  . haloperidol (HALDOL) tablet 5 mg  5 mg Oral Q6H PRN Johnn Hai, MD   5 mg at 09/01/18 2120   Or  . haloperidol lactate (HALDOL) injection 10 mg  10 mg Intramuscular Q6H PRN Johnn Hai, MD      . LORazepam (ATIVAN) tablet 1 mg  1 mg Oral Q6H PRN Johnn Hai, MD   1 mg at 09/01/18 0916   Or  . LORazepam (ATIVAN) injection 2 mg  2 mg Intramuscular Q4H PRN Johnn Hai, MD      . LORazepam (ATIVAN) tablet 1 mg  1 mg Oral BID Johnn Hai, MD   1 mg at 09/01/18 1643  . magnesium hydroxide (MILK OF MAGNESIA) suspension 30 mL  30 mL Oral Daily PRN Patrecia Pour, NP   30 mL at 08/31/18 2320  . pantoprazole (PROTONIX) EC tablet 40 mg  40 mg Oral BID Johnn Hai, MD   40 mg at 09/01/18 1644  . temazepam (RESTORIL) capsule 45 mg  45 mg Oral QHS Johnn Hai, MD   45 mg at 09/01/18 2119  . traZODone (DESYREL) tablet 100 mg  100 mg Oral BID Johnn Hai, MD   100 mg at 09/01/18 1643    Lab Results:  Results for orders placed or performed during the hospital encounter of 08/31/18 (from the past 48 hour(s))  Valproic acid level     Status: Abnormal   Collection Time: 09/01/18  7:01 AM  Result Value Ref Range   Valproic Acid Lvl 26 (L) 50.0 - 100.0 ug/mL    Comment: Performed at [facility], [address] [place], [place] [address]    Blood Alcohol level:  Lab Results  Component Value Date   [facility] <10 08/31/2018   ETH <10 62/22/9798    Metabolic Disorder Labs: Lab Results  Component Value Date   HGBA1C 5.4 09/30/2016   MPG 108.28  09/30/2016   MPG 114 09/17/2016   No results found for: PROLACTIN Lab Results  Component Value Date   CHOL 135 09/30/2016   TRIG 104 02/25/2018   HDL 60 09/30/2016   CHOLHDL 2.3 09/30/2016  VLDL 12 09/30/2016   LDLCALC 63 09/30/2016    Physical Findings: AIMS: Facial and Oral Movements Muscles of Facial Expression: None, normal Lips and Perioral Area: None, normal Jaw: None, normal Tongue: None, normal,Extremity Movements Upper (arms, wrists, hands, fingers): None, normal Lower (legs, knees, ankles, toes): None, normal, Trunk Movements Neck, shoulders, hips: None, normal, Overall Severity Severity of abnormal movements (highest score from questions above): None, normal Incapacitation due to abnormal movements: None, normal Patient's awareness of abnormal movements (rate only patient's report): No Awareness, Dental Status Current problems with teeth and/or dentures?: No Does patient usually wear dentures?: No  CIWA:  CIWA-Ar Total: 2 COWS:  COWS Total Score: 4  Musculoskeletal: Strength & Muscle Tone: within normal limits Gait & Station: normal Patient leans: N/A  Psychiatric Specialty Exam: Physical Exam  ROS  Blood pressure 132/90, pulse 95, temperature 97.6 F (36.4 C), temperature source Oral, resp. rate 15, height 5\' 11"  (1.803 m), weight 67.1 kg, SpO2 99 %.Body mass index is 20.64 kg/m.  General Appearance: Casual  Eye Contact:  Fair  Speech:  Garbled, Pressured and Slurred  Volume:  Increased  Mood:  labile  Affect:  Labile  Thought Process:  Disorganized  Orientation:  Other:  Place general situation person  Thought Content:  Illogical and Tangential  Suicidal Thoughts:  No  Homicidal Thoughts:  No  Memory:  Immediate;   Poor  Judgement:  Impaired  Insight:  Lacking  Psychomotor Activity:  Normal  Concentration:  Attention Span: Poor  Recall:  Poor  Fund of Knowledge:  Poor  Language:  Poor  Akathisia:  Negative  Handed:  Right  AIMS (if  indicated):     Assets:  Social Support  ADL's:  Intact  Cognition: Compromised by psychosis  Sleep:  Number of Hours: 5.5     Treatment Plan Summary: Daily contact with patient to assess and evaluate symptoms and progress in treatment, Medication management and Plan Escalate clozapine continue current precautions add gabapentin  Beva Remund, MD 09/02/2018, 9:19 AM

## 2018-09-03 MED ORDER — ZOLPIDEM TARTRATE 5 MG PO TABS
5.0000 mg | ORAL_TABLET | Freq: Every day | ORAL | Status: DC
  Administered 2018-09-03 – 2018-10-03 (×31): 5 mg via ORAL
  Filled 2018-09-03 (×31): qty 1

## 2018-09-03 NOTE — Progress Notes (Signed)
Patient went to bed but remained restless and anxious. Unable to stay still in bed, trying to get out the bed with his unsteady gait. Unable to stay in the chair. Appears  to be tired and overworked. Received Ativan 2 mg IM in the left gluteus and tolerated well. Safety precautions reinforced on 1:1.

## 2018-09-03 NOTE — [facility] Counselor (Signed)
CSW attempted to call the patients group home, however was unable to speak with her. CSW left a HIPAA compliant voicemail.  Assunta Curtis, MSW, LCSW 09/03/2018 10:17 AM

## 2018-09-03 NOTE — Progress Notes (Signed)
Was the fall witnessed: yes   Patient condition before and after the fall: the same   Patient's reaction to the fall: no reaction but just got up   Name of the doctor that was notified including date and time: John Clapcs   Any interventions and vital signs: vital signs , huddle with staff

## 2018-09-03 NOTE — Progress Notes (Signed)
Recreation Therapy Notes  INPATIENT RECREATION THERAPY ASSESSMENT  Patient Details Name: Austin Gates MRN: [id number] DOB: 1960/06/29 Today's Date: 09/03/2018       Information Obtained From: Chart Review  Able to Participate in Assessment/Interview: No  Patient Presentation: Confused, [place]  Reason for Admission (Per Patient): Active Symptoms  Patient Stressors:    Coping Skills:   Music  Leisure Interests (2+):  Sports - Basketball, Sports - Field seismologist of Recreation/Participation: Financial risk analyst Resources:     [employer]:     Current Use:    If no, Barriers?:    Expressed Interest in [employer] Information:    [employer] of Residence:  Insurance underwriter  Patient Main Form of Transportation: Other (Comment)(Group home)  Patient Strengths:  N/A  Patient Identified Areas of Improvement:  behavior  Patient Goal for Hospitalization:  N/A  Current SI (including self-harm):     Current HI:     Current AVH:    Staff Intervention Plan: Group Attendance, Collaborate with Interdisciplinary Treatment Team  Consent to Intern Participation: N/A  Aireanna Luellen 09/03/2018, 1:58 PM

## 2018-09-03 NOTE — Plan of Care (Signed)
Pt was educated on care plan. Pt is a 1:1 because he is a fall risk. Will continue to monitor and keep safe. [name] RN Problem: Activity: Goal: Will identify at least one activity in which they can participate Outcome: Not Progressing   Problem: Coping: Goal: Ability to identify and develop effective coping behavior will improve Outcome: Not Progressing Goal: Ability to interact with others will improve Outcome: Not Progressing Goal: Demonstration of participation in decision-making regarding own care will improve Outcome: Not Progressing Goal: Ability to use eye contact when communicating with others will improve Outcome: Not Progressing   Problem: Health Behavior/Discharge Planning: Goal: Identification of resources available to assist in meeting health care needs will improve Outcome: Not Progressing   Problem: Self-Concept: Goal: Will verbalize positive feelings about self Outcome: Not Progressing   Problem: Education: Goal: Utilization of techniques to improve thought processes will improve Outcome: Not Progressing Goal: Knowledge of the prescribed therapeutic regimen will improve Outcome: Not Progressing   Problem: Activity: Goal: Interest or engagement in leisure activities will improve Outcome: Not Progressing Goal: Imbalance in normal sleep/wake cycle will improve Outcome: Not Progressing   Problem: Coping: Goal: Coping ability will improve Outcome: Not Progressing Goal: Will verbalize feelings Outcome: Not Progressing   Problem: Health Behavior/Discharge Planning: Goal: Ability to make decisions will improve Outcome: Not Progressing Goal: Compliance with therapeutic regimen will improve Outcome: Not Progressing   Problem: Role Relationship: Goal: Will demonstrate positive changes in social behaviors and relationships Outcome: Not Progressing   Problem: Safety: Goal: Ability to disclose and discuss suicidal ideas will improve Outcome: Not  Progressing Goal: Ability to identify and utilize support systems that promote safety will improve Outcome: Not Progressing   Problem: Self-Concept: Goal: Will verbalize positive feelings about self Outcome: Not Progressing Goal: Level of anxiety will decrease Outcome: Not Progressing   Problem: Education: Goal: Ability to verbalize precipitating factors for violent behavior will improve Outcome: Not Progressing   Problem: Coping: Goal: Ability to verbalize frustrations and anger appropriately will improve Outcome: Not Progressing   Problem: Health Behavior/Discharge Planning: Goal: Ability to implement measures to prevent violent behavior in the future will improve Outcome: Not Progressing   Problem: Safety: Goal: Ability to demonstrate self-control will improve Outcome: Not Progressing Goal: Ability to redirect hostility and anger into socially appropriate behaviors will improve Outcome: Not Progressing

## 2018-09-03 NOTE — [facility] Counselor (Signed)
CSW attempted to obtain a fax or e-mail for consents to be sent, however, guardian reports that she has neither.    CSW obtained verbal permission to contact care coordinator, group home and patient's current psychiatrist/therapist for aftercare referrals.    Guardian reports "He was seeing Dr. Tamera Punt but he decided that he couldn't see him anymore because he wasn't able to help him.  He recently started seeing Dr. Loni Muse out of [place].  His care coordinator will have all that information".    Care Coordinator-[facility]  Assunta Curtis, MSW, LCSW 09/03/2018 10:05 AM
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
## 2018-09-03 NOTE — B[facility] Counselor (Signed)
CSW attempted to call the patients care coordinator.  There was no answer and CSW left HIPAA compliant voicemail.  Assunta Curtis, MSW, LCSW 09/03/2018 11:07 AM

## 2018-09-03 NOTE — Plan of Care (Signed)
  Problem: Activity: Goal: Will identify at least one activity in which they can participate Outcome: Not Progressing  D: Patient restless and eating constantly, asking for peanut butter and graham crackers. Trying to get out of bed without assistance. Speech slurred. Unable to understand patient. A: Continue 1:1 for safety R: Patient resting with eyes closed

## 2018-09-03 NOTE — Tx Team (Addendum)
Interdisciplinary Treatment and Diagnostic Plan Update  09/03/2018 Time of Session: 230p Austin Gates MRN: [id number]  Principal Diagnosis: <principal problem not specified>  Secondary Diagnoses: Active Problems:   Schizoaffective disorder, bipolar type (HCC)   Current Medications:  Current Facility-Administered Medications  Medication Dose Route Frequency Provider Last Rate Last Dose  . acetaminophen (TYLENOL) tablet 650 mg  650 mg Oral Q6H PRN Patrecia Pour, NP      . alum & mag hydroxide-simeth (MAALOX/MYLANTA) 200-200-20 MG/5ML suspension 30 mL  30 mL Oral Q4H PRN Patrecia Pour, NP      . clopidogrel (PLAVIX) tablet 75 mg  75 mg Oral Daily Patrecia Pour, NP   75 mg at 09/03/18 0741  . cloZAPine (CLOZARIL) tablet 300 mg  300 mg Oral QHS Johnn Hai, MD   300 mg at 09/02/18 2130  . divalproex (DEPAKOTE) DR tablet 500 mg  500 mg Oral BID Patrecia Pour, NP   500 mg at 09/03/18 0742  . gabapentin (NEURONTIN) capsule 300 mg  300 mg Oral TID Johnn Hai, MD   300 mg at 09/03/18 1231  . haloperidol (HALDOL) tablet 5 mg  5 mg Oral Q6H PRN Johnn Hai, MD   5 mg at 09/01/18 2120   Or  . haloperidol lactate (HALDOL) injection 10 mg  10 mg Intramuscular Q6H PRN Johnn Hai, MD      . LORazepam (ATIVAN) tablet 1 mg  1 mg Oral Q6H PRN Johnn Hai, MD   1 mg at 09/01/18 0916   Or  . LORazepam (ATIVAN) injection 2 mg  2 mg Intramuscular Q4H PRN Johnn Hai, MD   2 mg at 09/03/18 0044  . LORazepam (ATIVAN) tablet 1 mg  1 mg Oral BID Johnn Hai, MD   1 mg at 09/03/18 0741  . magnesium hydroxide (MILK OF MAGNESIA) suspension 30 mL  30 mL Oral Daily PRN Patrecia Pour, NP   30 mL at 08/31/18 2320  . pantoprazole (PROTONIX) EC tablet 40 mg  40 mg Oral BID Johnn Hai, MD   40 mg at 09/03/18 0743  . zolpidem (AMBIEN) tablet 5 mg  5 mg Oral QHS [name], [name], MD       PTA Medications: Medications Prior to Admission  Medication Sig Dispense Refill Last Dose  . acetaminophen  (TYLENOL) 500 MG tablet Take 500 mg by mouth every 6 (six) hours as needed for mild pain.     . benztropine (COGENTIN) 1 MG tablet Take 1 mg by mouth 2 (two) times daily.     . clopidogrel (PLAVIX) 75 MG tablet Take 1 tablet (75 mg total) by mouth daily. 30 tablet 0   . cloZAPine (CLOZARIL) 100 MG tablet Take 1.5 tablets (150 mg total) by mouth 3 (three) times daily. At 8 AM, 5PM, 8PM per Dr. Darleene Cleaver (Patient taking differently: Take 100-200 mg by mouth 3 (three) times daily. One tablet (100 mg) At 8 AM, and 2PM, two tablets (200 mg) at bedtime per Dr. Darleene Cleaver)     . clozapine (CLOZARIL) 50 MG tablet Take 50 mg by mouth 2 (two) times daily. At 8 am and 2 pm     . diphenhydrAMINE (BENADRYL) 50 MG capsule Take 50 mg by mouth every 6 (six) hours as needed for allergies (sedation).      Marland Kitchen divalproex (DEPAKOTE) 500 MG DR tablet Take 1 tablet (500 mg total) by mouth 2 (two) times daily. 30 tablet 0   . haloperidol (HALDOL) 5 MG tablet Take  5 mg by mouth daily as needed (severe agitation).      . LORazepam (ATIVAN) 1 MG tablet Take 1 mg by mouth daily as needed (severe agitation).      . Maltodextrin-Xanthan Gum (RESOURCE THICKENUP CLEAR) POWD Take 120 g by mouth as needed. (Patient not taking: Reported on 04/07/2018) 1 Can 1   . traZODone (DESYREL) 100 MG tablet Take 100 mg by mouth at bedtime.       Patient Stressors: Financial difficulties Health problems Medication change or noncompliance  Patient Strengths: Motivation for treatment/growth Special hobby/interest Supportive family/friends  Treatment Modalities: Medication Management, Group therapy, Case management,  1 to 1 session with clinician, Psychoeducation, Recreational therapy.   Physician Treatment Plan for Primary Diagnosis: <principal problem not specified> Long Term Goal(s): Improvement in symptoms so as ready for discharge Improvement in symptoms so as ready for discharge   Short Term Goals: Ability to identify and develop  effective coping behaviors will improve Ability to maintain clinical measurements within normal limits will improve Compliance with prescribed medications will improve Ability to verbalize feelings will improve Ability to disclose and discuss suicidal ideas Ability to demonstrate self-control will improve Ability to identify and develop effective coping behaviors will improve  Medication Management: Evaluate patient's response, side effects, and tolerance of medication regimen.  Therapeutic Interventions: 1 to 1 sessions, Unit Group sessions and Medication administration.  Evaluation of Outcomes: Not Met  Physician Treatment Plan for Secondary Diagnosis: Active Problems:   Schizoaffective disorder, bipolar type ([place])  Long Term Goal(s): Improvement in symptoms so as ready for discharge Improvement in symptoms so as ready for discharge   Short Term Goals: Ability to identify and develop effective coping behaviors will improve Ability to maintain clinical measurements within normal limits will improve Compliance with prescribed medications will improve Ability to verbalize feelings will improve Ability to disclose and discuss suicidal ideas Ability to demonstrate self-control will improve Ability to identify and develop effective coping behaviors will improve     Medication Management: Evaluate patient's response, side effects, and tolerance of medication regimen.  Therapeutic Interventions: 1 to 1 sessions, Unit Group sessions and Medication administration.  Evaluation of Outcomes: Not Met   RN Treatment Plan for Primary Diagnosis: <principal problem not specified> Long Term Goal(s): Knowledge of disease and therapeutic regimen to maintain health will improve  Short Term Goals: Ability to demonstrate self-control, Ability to identify and develop effective coping behaviors will improve and Compliance with prescribed medications will improve  Medication Management: RN will  administer medications as ordered by provider, will assess and evaluate patient's response and provide education to patient for prescribed medication. RN will report any adverse and/or side effects to prescribing provider.  Therapeutic Interventions: 1 on 1 counseling sessions, Psychoeducation, Medication administration, Evaluate responses to treatment, Monitor vital signs and CBGs as ordered, Perform/monitor CIWA, COWS, AIMS and Fall Risk screenings as ordered, Perform wound care treatments as ordered.  Evaluation of Outcomes: Not Met   LCSW Treatment Plan for Primary Diagnosis: <principal problem not specified> Long Term Goal(s): Safe transition to appropriate next level of care at discharge, Engage patient in therapeutic group addressing interpersonal concerns.  Short Term Goals: Engage patient in aftercare planning with referrals and resources  Therapeutic Interventions: Assess for all discharge needs, 1 to 1 time with Social worker, Explore available resources and support systems, Assess for adequacy in community support network, Educate family and significant other(s) on suicide prevention, Complete Psychosocial Assessment, Interpersonal group therapy.  Evaluation of Outcomes: Not Met  Progress in Treatment: Attending groups: No. Participating in groups: No. Taking medication as prescribed: Yes. Toleration medication: Yes. Family/Significant other contact made: Yes, individual(s) contacted:  [name], legal guardian Patient understands diagnosis: No. Discussing patient identified problems/goals with staff: No. Medical problems stabilized or resolved: Yes. Denies suicidal/homicidal ideation: Yes. Issues/concerns per patient self-inventory: No. Other: NA  New problem(s) identified: No, Describe:  none reported  New Short Term/Long Term Goal(s): Pt did not attend today's meeting. Nurse Tech reports the pt is sedated and resting in his room.  Patient Goals:  Goal not  obtained at this time.  Discharge Plan or Barriers: Pt will follow up at [facility] in [place] where he is an established patient.  Reason for Continuation of Hospitalization: Medication stabilization  Estimated Length of Stay: 3-5 days  Recreational Therapy: Patient: N/A Patient Goal: Patient will engage in groups without prompting or encouragement from LRT x3 group sessions within 5 recreation therapy group sessions   Attendees: Patient:Did not attend 09/03/2018 2:51 PM  Physician: Alethia Berthold 09/03/2018 2:51 PM  Nursing:  09/03/2018 2:51 PM  RN Care Manager: 09/03/2018 2:51 PM  Social Worker: Darren Lehman Prom Assunta Curtis [place] 09/03/2018 2:51 PM  Recreational Therapist: Roanna Epley 09/03/2018 2:51 PM  Other:  09/03/2018 2:51 PM  Other:  09/03/2018 2:51 PM  Other: 09/03/2018 2:51 PM    Scribe for Treatment Team: Yvette Rack, LCSW 09/03/2018 2:51 PM
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
## 2018-09-03 NOTE — Progress Notes (Addendum)
Recreation Therapy Notes  Date: 09/03/2018  Time: 9:30 am  Location: Craft room  Behavioral response: Appropriate   Intervention Topic: Self-care  Discussion/Intervention:  Group content today was focused on Self-Care. The group defined self-care and some positive ways they care for themselves. Individuals expressed ways and reasons why they neglected any self-care in the past. Patients described ways to improve self-care in the future. The group explained what could happen if they did not do any self-care activities at all. The group participated in the intervention "self-care assessment" where they had a chance to discover some of their weaknesses and strengths in self- care. Patient came up with a self-care plan to improve themselves in the future.  Clinical Observations/Feedback:  Patient came to group late due to unknown reasons. He stated he participates in self care by listening to music and showering.  Individual was social with peers and staff while participating in group.  Leilyn Frayre LRT/CTRS         Ronrico Dupin 09/03/2018 10:55 AM

## 2018-09-03 NOTE — Progress Notes (Signed)
1:1 observation 1900-2300 D: Patient on 1:1 for safety. Constantly trying to get up and walk unassisted. Constantly asking for graham crackers and peanut butter. Speech is slurred, and incomprehensible. Patient is drooling constantly. Eating and dribbling food all over himself. A: Continue 1:1 for safety R: Safety maintained. 0000-0400 D: Patient on 1:1 while awake. Patient has been resting in bed with eyes closed.  A: Continue to monitor R: Safety maintained. 0506 D: Patient got up and fell before staff could reach him. Fell onto his side. Did not hit his head. Assisted back into bed. Denies pain. Able to bear weight on both legs. No signs of injury.Patient continues to be restless. Constantly moving. Constantly asking for peanut butter. Drooling. Constantly needing redirection. Dr. Jake Samples notified. A: Continue 1:1 observation R: Patient in bed, constantly trying to get out. MHT within reach.

## 2018-09-03 NOTE — BHH Group Notes (Signed)
LCSW Group Therapy Note   09/03/2018 1:00 PM  Type of Therapy and Topic:  Group Therapy:  Overcoming Obstacles   Participation Level:  Did Not Attend   Description of Group:    In this group patients will be encouraged to explore what they see as obstacles to their own wellness and recovery. They will be guided to discuss their thoughts, feelings, and behaviors related to these obstacles. The group will process together ways to cope with barriers, with attention given to specific choices patients can make. Each patient will be challenged to identify changes they are motivated to make in order to overcome their obstacles. This group will be process-oriented, with patients participating in exploration of their own experiences as well as giving and receiving support and challenge from other group members.   Therapeutic Goals: 1. Patient will identify personal and current obstacles as they relate to admission. 2. Patient will identify barriers that currently interfere with their wellness or overcoming obstacles.  3. Patient will identify feelings, thought process and behaviors related to these barriers. 4. Patient will identify two changes they are willing to make to overcome these obstacles:      Summary of Patient Progress X   Therapeutic Modalities:   Cognitive Behavioral Therapy Solution Focused Therapy Motivational Interviewing Relapse Prevention Therapy  Assunta Curtis, MSW, LCSW 09/03/2018 11:11 AM

## 2018-09-03 NOTE — Progress Notes (Signed)
Pt asleep.

## 2018-09-03 NOTE — Progress Notes (Signed)
  Patient is steadily on 1&1 , patient has fallen more than ones, therefore close monitoring and 1&1  is followed per protocol, patient is secure in bed at this time , no  Distress , safety is maintained.

## 2018-09-04 LAB — CLOZAPINE (CLOZARIL)
Clozapine Lvl: 764 ng/mL — ABNORMAL HIGH (ref 350–650)
Clozapine Lvl: 749 ng/mL — ABNORMAL HIGH (ref 350–650)
NorClozapine: 239 ng/mL
NorClozapine: 195 ng/mL
Total(Cloz+Norcloz): 1003 ng/mL
Total(Cloz+Norcloz): 944 ng/mL

## 2018-09-04 NOTE — BHH Group Notes (Signed)
Feelings Around Diagnosis 09/04/2018 1PM  Type of Therapy/Topic:  Group Therapy:  Feelings about Diagnosis  Participation Level:  Did Not Attend   Description of Group:   This group will allow patients to explore their thoughts and feelings about diagnoses they have received. Patients will be guided to explore their level of understanding and acceptance of these diagnoses. Facilitator will encourage patients to process their thoughts and feelings about the reactions of others to their diagnosis and will guide patients in identifying ways to discuss their diagnosis with significant others in their lives. This group will be process-oriented, with patients participating in exploration of their own experiences, giving and receiving support, and processing challenge from other group members.   Therapeutic Goals: 1. Patient will demonstrate understanding of diagnosis as evidenced by identifying two or more symptoms of the disorder 2. Patient will be able to express two feelings regarding the diagnosis 3. Patient will demonstrate their ability to communicate their needs through discussion and/or role play  Summary of Patient Progress:       Therapeutic Modalities:   Cognitive Behavioral Therapy Brief Therapy Feelings Identification    Yvette Rack, LCSW 09/04/2018 2:12 PM

## 2018-09-04 NOTE — BHH Counselor (Signed)
CSW received call from the patients guardian and sister and her husband.  Both were adamant that the patient not be released to the group home "not because it is not a group home but they need to find another caregiver for him.  That one caregiver is always involved when he goes to the hospital.  It is a safety risk for that caregiver to be in charge of him."  CSW let the family know that she will follow up with the NP and psychiatrist and have them follow up with the family.    Assunta Curtis, MSW, LCSW 09/04/2018 12:25 PM

## 2018-09-04 NOTE — Progress Notes (Signed)
Pt seems to be feeling better today. He was more redirectable, calm and cooperative. He has been on a 1:1 today. transfering with a gait belt and using the wheelchair. He has been eating and drinking better and complain of no pain. He spoke with his sister today on the phone. [name] RN

## 2018-09-04 NOTE — Progress Notes (Signed)
Pt says "I do not want to go back to that group home. They are mean to me. I want to go to an old folks home".[name] RN

## 2018-09-04 NOTE — BHH Counselor (Signed)
CSW called Shirlean Mylar to let know that the [facility] was complete and awaiting the psychiatrists signautre.    Shirlean Mylar reports taht the guardian has asked her not to pick up the patient at this time.    CSW informed that NP Melburn Popper had just had a conversation with the guardian explaining need for discharge and guardian agreed to have the patient discharged.   Assunta Curtis, MSW, LCSW 09/04/2018 11:22 AM

## 2018-09-04 NOTE — BHH Counselor (Signed)
CSW spoke with  Blas over the group home.  CSW informed that FL2 had been faxed, however, patient is no longer up for discharge.    Robin requested that the patient's pharmacy be sent a letter of medications that are to be discontinued or a copy of his prescriptions as there was some confusion.  Pharmacy information is below.   CSW asked when, if possible the caregiver could be replaced or removed and was informed "that will take at least 14 days".  She reports that "his safety is a bigger concern for me than the caregiver not being an effective communicator".  She reports that the caregiver was hesitant to pick up the patient if discharged today because he no longer felt comfortable in a car with the patient.  She further reports that "all I am going to do is what I am required to do is let him return and give a 60 day notice and allow him to stay those 60 days".  Robin wouldn't or couldn't clarify if she will continue to look for alternative placement for the patient as previously discussed.  CSW reiterated that per the NP the patient was clear to be discharged, however CSW will follow up once the patient has been discharged.  [place] P: [phone] F: [place], MSW, LCSW 09/04/2018 3:38 PM

## 2018-09-04 NOTE — Progress Notes (Signed)
Recreation Therapy Notes  Date: 09/04/2018  Time: 9:30 am  Location: Craft room  Behavioral response: Appropriate   Intervention Topic: Necessities  Discussion/Intervention:  Group content on today was focused on necessities. The group defined necessities and how they determine their necessities. Individuals expressed how many necessities they have and if it changes from day to day. Patients described the difference between wants and needs. The group explained how they have overspent on wants in the past. Individuals described a reoccurring necessity for them. The intervention "What I need" helped patients differentiate between wants and needs.  Clinical Observations/Feedback:  Patient came to group and defined necessities as something you have to have. He identified his necessities as water, clothing and food. Individual was social with peers and staff while participating in group.  Tomio Kirk LRT/CTRS         Camilo Mander 09/04/2018 11:07 AM

## 2018-09-04 NOTE — Plan of Care (Signed)
Pt denies depression, anxiety, SI, HI and AVH. Pt has beed educate on care plan, He has been educated concerning his fall risk, preventive measures and he verbalizes understanding. Will continue to monitor. [name] RN Problem: Activity: Goal: Will identify at least one activity in which they can participate Outcome: Progressing   Problem: Coping: Goal: Ability to identify and develop effective coping behavior will improve Outcome: Progressing Goal: Ability to interact with others will improve Outcome: Progressing Goal: Demonstration of participation in decision-making regarding own care will improve Outcome: Progressing Goal: Ability to use eye contact when communicating with others will improve Outcome: Progressing   Problem: Health Behavior/Discharge Planning: Goal: Identification of resources available to assist in meeting health care needs will improve Outcome: Progressing   Problem: Self-Concept: Goal: Will verbalize positive feelings about self Outcome: Progressing   Problem: Education: Goal: Utilization of techniques to improve thought processes will improve Outcome: Progressing Goal: Knowledge of the prescribed therapeutic regimen will improve Outcome: Progressing   Problem: Activity: Goal: Interest or engagement in leisure activities will improve Outcome: Progressing Goal: Imbalance in normal sleep/wake cycle will improve Outcome: Progressing   Problem: Coping: Goal: Coping ability will improve Outcome: Progressing Goal: Will verbalize feelings Outcome: Progressing   Problem: Health Behavior/Discharge Planning: Goal: Ability to make decisions will improve Outcome: Progressing Goal: Compliance with therapeutic regimen will improve Outcome: Progressing   Problem: Role Relationship: Goal: Will demonstrate positive changes in social behaviors and relationships Outcome: Progressing   Problem: Safety: Goal: Ability to disclose and discuss suicidal ideas  will improve Outcome: Progressing Goal: Ability to identify and utilize support systems that promote safety will improve Outcome: Progressing   Problem: Self-Concept: Goal: Will verbalize positive feelings about self Outcome: Progressing Goal: Level of anxiety will decrease Outcome: Progressing   Problem: Education: Goal: Ability to verbalize precipitating factors for violent behavior will improve Outcome: Progressing

## 2018-09-04 NOTE — Progress Notes (Signed)
[facility] MD Progress Note  09/04/2018 3:52 PM Austin Gates  MRN:  [id number] Subjective: Follow-up for this patient with schizoaffective disorder on top of grief mental ability.  Patient did did not have any specific complaints today other than to request that he not go back to his group home and that he gets some peanut butter and crackers.  Patient's speech is much easier to understand today.  He is able to move himself around more independently.  He has not been aggressive or violent today.  Spoke with his long-term psychologist as well as a representative from [facility].  Treatment team has been working on disposition plans.  It sounds like the current place where he is living has not been up to the task of taking care of his recent behaviors.  Right now he is looking more alert and a little more independent although still at high fall risk. 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 Hypertension   . Intellectual disability   . Obsessive-compulsive disorder   . Schizo-affective schizophrenia [facility])     Past Surgical History:  Procedure Laterality Date  . COLONOSCOPY WITH PROPOFOL N/A 12/28/2017   Procedure: COLONOSCOPY WITH PROPOFOL;  Surgeon: Jonathon Bellows, MD;  Location: [facility] ENDOSCOPY;  Service: Gastroenterology;  Laterality: N/A;  . HYDROCELE EXCISION Bilateral 02/22/2018   Procedure: bilateral HYDROCELECTOMY ADULT;  Surgeon: Cleon Gustin, MD;  Location: [place];  Service: Urology;  Laterality: Bilateral;  . INSERTION OF  ILIAC STENT Left 02/22/2018   Procedure: INSERTION LEFT SUPERIOR FEMORAL ARTERY USING 6MM X 5CM VIABHON STENT with mynx device closure on right femoral artery;  Surgeon: Waynetta Sandy, MD;  Location: [place];  Service: Vascular;  Laterality: Left;  . KNEE ARTHROSCOPY    . SCROTAL EXPLORATION N/A 02/22/2018   Procedure: SCROTUM EXPLORATION;  Surgeon: Cleon Gustin, MD;  Location: [place];  Service: Urology;  Laterality: N/A;  . UPPER EXTREMITY ANGIOGRAM Left 02/22/2018   Procedure: left lower EXTREMITY ANGIOGRAM;  Surgeon: Waynetta Sandy, MD;  Location[facility];  Service: Vascular;  Laterality: Left;   Family History:  Family History  Problem Relation Age of Onset  . Diabetes Mother    Family Psychiatric  History: See previous Social History:  Social History   Substance and Sexual Activity  Alcohol Use No     Social History   Substance and Sexual Activity  Drug Use No    Social History   Socioeconomic History  . Marital status: Single    Spouse name: Not on file  . Number of children: Not on file  . Years of education: Not on file  . Highest education level: Not on file  Occupational History  . Occupation: Disabled  Social Needs  . Financial resource strain: Not on file  . Food insecurity    Worry: Not on file    Inability: Not on file  . Transportation needs    Medical: Not on file    Non-medical: Not on file  Tobacco Use  . Smoking status: Former Smoker    Types: Cigarettes  . Smokeless tobacco: Never  Used  Substance and Sexual Activity  . Alcohol use: No  . Drug use: No  . Sexual activity: Never  Lifestyle  . Physical activity    Days per week: Not on file    Minutes per session: Not on file  . Stress: Not on file  Relationships  . Social Herbalist on phone: Not on file    Gets together: Not on file    Attends religious service: Not on file    Active member of club or organization: Not on file    Attends meetings of clubs or  organizations: Not on file    Relationship status: Not on file  Other Topics Concern  . Not on file  Social History Narrative   ** Merged History Encounter **       Additional Social History:                         Sleep: Fair  Appetite:  Fair  Current Medications: Current Facility-Administered Medications  Medication Dose Route Frequency Provider Last Rate Last Dose  . acetaminophen (TYLENOL) tablet 650 mg  650 mg Oral Q6H PRN Patrecia Pour, NP      . alum & mag hydroxide-simeth (MAALOX/MYLANTA) 200-200-20 MG/5ML suspension 30 mL  30 mL Oral Q4H PRN Patrecia Pour, NP      . clopidogrel (PLAVIX) tablet 75 mg  75 mg Oral Daily Patrecia Pour, NP   75 mg at 09/04/18 0820  . cloZAPine (CLOZARIL) tablet 300 mg  300 mg Oral QHS Johnn Hai, MD   300 mg at 09/03/18 2058  . divalproex (DEPAKOTE) DR tablet 500 mg  500 mg Oral BID Patrecia Pour, NP   500 mg at 09/04/18 0819  . gabapentin (NEURONTIN) capsule 300 mg  300 mg Oral TID Johnn Hai, MD   300 mg at 09/04/18 1159  . haloperidol (HALDOL) tablet 5 mg  5 mg Oral Q6H PRN Johnn Hai, MD   5 mg at 09/01/18 2120   Or  . haloperidol lactate (HALDOL) injection 10 mg  10 mg Intramuscular Q6H PRN Johnn Hai, MD      . LORazepam (ATIVAN) tablet 1 mg  1 mg Oral Q6H PRN Johnn Hai, MD   1 mg at 09/01/18 0916   Or  . LORazepam (ATIVAN) injection 2 mg  2 mg Intramuscular Q4H PRN Johnn Hai, MD   2 mg at 09/03/18 0044  . LORazepam (ATIVAN) tablet 1 mg  1 mg Oral BID Johnn Hai, MD   1 mg at 09/04/18 0820  . magnesium hydroxide (MILK OF MAGNESIA) suspension 30 mL  30 mL Oral Daily PRN Patrecia Pour, NP   30 mL at 08/31/18 2320  . pantoprazole (PROTONIX) EC tablet 40 mg  40 mg Oral BID Johnn Hai, MD   40 mg at 09/04/18 4696  . zolpidem (AMBIEN) tablet 5 mg  5 mg Oral QHS Shawneequa Baldridge, [name], MD   5 mg at 09/03/18 2058    Lab Results:  Results for orders placed or performed during the hospital encounter of 08/31/18  (from the past 48 hour(s))  Clozapine (clozaril)     Status: Abnormal   Collection Time: 09/03/18  1:52 PM  Result Value Ref Range   Clozapine Lvl 749 (H) 350 - 650 ng/mL    Comment: (NOTE) This test was developed and its performance characteristics determined by [facility]. It has not been cleared or approved by  the Food and Drug Administration.    NorClozapine 195 Not Estab. ng/mL    Comment: (NOTE) This test was developed and its performance characteristics determined by [facility]. It has not been cleared or approved by the Food and Drug Administration.    Total(Cloz+Norcloz) 944 ng/mL    Comment: (NOTE) Patients dosed with 400 mg clozapine daily for 4 weeks were most likely to exhibit a therapeutic effect when the sum of clozapine and norclozapine concentrations were at least 450 ng/mL. Vira Agar, et al. Rexford Maus Consensus Guidelines for Therapeutic Drug Monitoring in Psychiatry: Update 2011, Pharmacopsychiatry Sep 2011; 44(6):195-235.                                Detection Limit = 20 Performed At: [facility] [place], [place] [id number] Rush Farmer MD [id number]     Blood Alcohol level:  Lab Results  Component Value Date   [facility] <10 08/31/2018   ETH <10 54/62/7035    Metabolic Disorder Labs: Lab Results  Component Value Date   HGBA1C 5.4 09/30/2016   MPG 108.28 09/30/2016   MPG 114 09/17/2016   No results found for: PROLACTIN Lab Results  Component Value Date   CHOL 135 09/30/2016   TRIG 104 02/25/2018   HDL 60 09/30/2016   CHOLHDL 2.3 09/30/2016   VLDL 12 09/30/2016   LDLCALC 63 09/30/2016    Physical Findings: AIMS: Facial and Oral Movements Muscles of Facial Expression: None, normal Lips and Perioral Area: None, normal Jaw: None, normal Tongue: None, normal,Extremity Movements Upper (arms, wrists, hands, fingers): None, normal Lower (legs, knees, ankles, toes): None, normal, Trunk Movements Neck,  shoulders, hips: None, normal, Overall Severity Severity of abnormal movements (highest score from questions above): None, normal Incapacitation due to abnormal movements: None, normal Patient's awareness of abnormal movements (rate only patient's report): No Awareness, Dental Status Current problems with teeth and/or dentures?: No Does patient usually wear dentures?: No  CIWA:  CIWA-Ar Total: 2 COWS:  COWS Total Score: 4  Musculoskeletal: Strength & Muscle Tone: within normal limits Gait & Station: normal Patient leans: N/A  Psychiatric Specialty Exam: Physical Exam  Nursing note and vitals reviewed. Constitutional: He appears well-developed and well-nourished.  HENT:  Head: Normocephalic and atraumatic.  Eyes: Pupils are equal, round, and reactive to light. Conjunctivae are normal.  Neck: Normal range of motion.  Cardiovascular: Regular rhythm and normal heart sounds.  Respiratory: Effort normal. No respiratory distress.  GI: Soft.  Musculoskeletal: Normal range of motion.  Neurological: He is alert.  Skin: Skin is warm and dry.  Psychiatric: His affect is blunt. His speech is delayed, tangential and slurred. He is not agitated. Thought content is not paranoid. Cognition and memory are impaired. He expresses inappropriate judgment.    Review of Systems  Constitutional: Positive for malaise/fatigue.  HENT: Negative.   Eyes: Negative.   Respiratory: Negative.   Cardiovascular: Negative.   Gastrointestinal: Negative.   Musculoskeletal: Negative.   Skin: Negative.   Neurological: Negative.   Psychiatric/Behavioral: Negative.     Blood pressure 132/88, pulse (!) 122, temperature 97.6 F (36.4 C), resp. rate 16, height 5\' 11"  (1.803 m), weight 67.1 kg, SpO2 95 %.Body mass index is 20.64 kg/m.  General Appearance: Casual  Eye Contact:  Fair  Speech:  Slow and Slurred  Volume:  Decreased  Mood:  Euthymic  Affect:  Flat  Thought Process:  Disorganized  Orientation:   Negative  Thought Content:  Tangential  Suicidal Thoughts:  No  Homicidal Thoughts:  No  Memory:  Immediate;   Fair Recent;   Poor Remote;   Poor  Judgement:  Poor  Insight:  Lacking  Psychomotor Activity:  Decreased  Concentration:  Concentration: Poor  Recall:  Poor  Fund of Knowledge:  Poor  Language:  Poor  Akathisia:  No  Handed:  Right  AIMS (if indicated):     Assets:  Social Support  ADL's:  Impaired  Cognition:  Impaired,  Moderate  Sleep:  Number of Hours: 5.15     Treatment Plan Summary: Daily contact with patient to assess and evaluate symptoms and progress in treatment, Medication management and Plan Patient appears to be coming back to something closer to his baseline today.  This is in terms of his being able to feed himself and more nearly able to ambulate with improved strength.  Also able to articulate words a little more clearly and engage in some back-and-forth conversation.  Seems to be getting more medically stable.  Disposition problem is that his current living facility has not been able to manage his behavior and falls risk.  We are going to work with his care coordinator and see if something better can be achieved in the next couple days to prevent rehospitalization.  Alethia Berthold, MD 09/04/2018, 3:52 PM
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
## 2018-09-04 NOTE — BHH Counselor (Signed)
CSW faxed the FL2 to Robin.  CSW received confirmation fax was successful.   Assunta Curtis, MSW, LCSW 09/04/2018 1:48 PM

## 2018-09-04 NOTE — BHH Counselor (Signed)
CSW spoke with the care coordinator Zipporah Plants, [phone].  CSW reviewed with car coordinator that current ASL of the patient is thinking that perhaps the patient would be better suited at a nursing home.  Care coordinator reports that that the patient is "not going to qualify" for a nursing home.  Care Coordinator asked if Monico Hoar, psychologist for [facility], office [phone] cell [phone], has talked with this CSW.  This CSW let him know that she has not.  He asked that CSW follow up with her for any follow up question on his clinical care.   CSW called Shirlean Mylar who reports that she is willing to have the patient return to her home, however, she will need to touch base with her staff on transportation as she felt that she may have had longer to arrange and will need to see if someone is available.  She said she will call back.  Robin requested a FL2.  Assunta Curtis, MSW, LCSW 09/04/2018 11:09 AM

## 2018-09-05 MED ORDER — ENSURE ENLIVE PO LIQD
237.0000 mL | Freq: Two times a day (BID) | ORAL | Status: DC
  Administered 2018-09-06 – 2018-09-16 (×20): 237 mL via ORAL

## 2018-09-05 NOTE — Progress Notes (Signed)
Recreation Therapy Notes   Date: 09/05/2018  Time: 9:30 am   Location: Craft room   Behavioral response: N/A   Intervention Topic: Stress  Discussion/Intervention: Patient did not attend group.   Clinical Observations/Feedback:  Patient did not attend group.   Sherri Mcarthy LRT/CTRS        [name] 09/05/2018 12:06 PM

## 2018-09-05 NOTE — Plan of Care (Signed)
1&1 is maintained for safety of self and others , patient responding appropriately for therapy and medication regimen, support and encouragement is provided, food is provided and hydration with fluids and juices, care of ADLs is also given, patient is resting and is calm , no out burst and no falls , safety is maintained no distress.   Problem: Activity: Goal: Will identify at least one activity in which they can participate Outcome: Progressing   Problem: Coping: Goal: Ability to identify and develop effective coping behavior will improve Outcome: Progressing Goal: Ability to interact with others will improve Outcome: Progressing Goal: Demonstration of participation in decision-making regarding own care will improve Outcome: Progressing Goal: Ability to use eye contact when communicating with others will improve Outcome: Progressing   Problem: Health Behavior/Discharge Planning: Goal: Identification of resources available to assist in meeting health care needs will improve Outcome: Progressing   Problem: Self-Concept: Goal: Will verbalize positive feelings about self Outcome: Progressing   Problem: Education: Goal: Utilization of techniques to improve thought processes will improve Outcome: Progressing Goal: Knowledge of the prescribed therapeutic regimen will improve Outcome: Progressing   Problem: Activity: Goal: Interest or engagement in leisure activities will improve Outcome: Progressing Goal: Imbalance in normal sleep/wake cycle will improve Outcome: Progressing   Problem: Coping: Goal: Coping ability will improve Outcome: Progressing Goal: Will verbalize feelings Outcome: Progressing   Problem: Health Behavior/Discharge Planning: Goal: Ability to make decisions will improve Outcome: Progressing Goal: Compliance with therapeutic regimen will improve Outcome: Progressing   Problem: Role Relationship: Goal: Will demonstrate positive changes in social behaviors  and relationships Outcome: Progressing   Problem: Safety: Goal: Ability to disclose and discuss suicidal ideas will improve Outcome: Progressing Goal: Ability to identify and utilize support systems that promote safety will improve Outcome: Progressing   Problem: Self-Concept: Goal: Will verbalize positive feelings about self Outcome: Progressing Goal: Level of anxiety will decrease Outcome: Progressing   Problem: Education: Goal: Ability to verbalize precipitating factors for violent behavior will improve Outcome: Progressing   Problem: Coping: Goal: Ability to verbalize frustrations and anger appropriately will improve Outcome: Progressing   Problem: Health Behavior/Discharge Planning: Goal: Ability to implement measures to prevent violent behavior in the future will improve Outcome: Progressing   Problem: Safety: Goal: Ability to demonstrate self-control will improve Outcome: Progressing Goal: Ability to redirect hostility and anger into socially appropriate behaviors will improve Outcome: Progressing

## 2018-09-05 NOTE — BHH Counselor (Signed)
CSW was stopped by patient. Patient reports "I just wanted to let you know that I am not here to hurt nobody or myself".  He reports "I am just here to watch TV that's it".    Patient did mention that he did not want to return to group home, instead reporting "I want to go to a nursing home".  CSW informed him that per a conversation with the patient's care coordinator, pt is not eligible for a group home, however, care coordinator and team are looking into placement in a facility that allows for 24 hour supervision.   Assunta Curtis, MSW, LCSW 09/05/2018 9:31 AM

## 2018-09-05 NOTE — Progress Notes (Signed)
Patient alert and oriented x 2 with periods of confusion to time and situation, he denies SI/HI/AVH, his thoughts are disorganized and incoherent, speech is garbled and unclear he appears impulsive and sometimes irritable, he was frequently redirected and reoriented for safety, he continues to be on 1:1 no distress noted , patient denies SI/HI/AVH will continue to monitor closely

## 2018-09-05 NOTE — BHH Counselor (Signed)
CSW spoke with the care coordinator Zipporah Plants, [phone].  CSW confirmed phone conference Thursday at 2:30pm.  CSW spoke on status of looking for placement for pt and was informed that the process has started but has not yielded any results at this time.  Assunta Curtis, MSW, LCSW 09/05/2018 11:39 AM

## 2018-09-05 NOTE — Progress Notes (Signed)
[facility] MD Progress Note  09/05/2018 3:29 PM Austin Gates  MRN:  [id number] Subjective: Follow-up for patient with intellectual disability and schizoaffective disorder.  Patient seen chart reviewed.  Patient wanted to speak in private this morning.  As I transpired the entirety of what he wanted to discuss was his desire for peanut butter and graham crackers.  Also mentioned that he would like to make sure he is getting Ensure.  Patient is not complaining of any mood symptoms.  Not complaining of psychosis.  Not agitated particularly although at night he will sometimes try to get up out of bed and is still at risk for falls.  His fall risk has left him still with a one-to-one sitter.  We are trying to stay in touch with his treatment team outside the hospital in hopes of finding some kind of discharge plan. 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 Hypertension   . Intellectual disability   . Obsessive-compulsive disorder   . Schizo-affective schizophrenia [facility])     Past Surgical History:  Procedure Laterality Date  . COLONOSCOPY WITH PROPOFOL N/A 12/28/2017   Procedure: COLONOSCOPY WITH PROPOFOL;  Surgeon: Jonathon Bellows, MD;  Location: [facility] ENDOSCOPY;  Service: Gastroenterology;  Laterality: N/A;  . HYDROCELE EXCISION Bilateral 02/22/2018   Procedure: bilateral HYDROCELECTOMY ADULT;  Surgeon: Cleon Gustin, MD;  Location: [place];  Service: Urology;  Laterality: Bilateral;  . INSERTION OF ILIAC STENT Left 02/22/2018   Procedure: INSERTION LEFT SUPERIOR FEMORAL ARTERY USING 6MM X 5CM VIABHON STENT with  mynx device closure on right femoral artery;  Surgeon: Waynetta Sandy, MD;  Location: [place];  Service: Vascular;  Laterality: Left;  . KNEE ARTHROSCOPY    . SCROTAL EXPLORATION N/A 02/22/2018   Procedure: SCROTUM EXPLORATION;  Surgeon: Cleon Gustin, MD;  Location: [place];  Service: Urology;  Laterality: N/A;  . UPPER EXTREMITY ANGIOGRAM Left 02/22/2018   Procedure: left lower EXTREMITY ANGIOGRAM;  Surgeon: Waynetta Sandy, MD;  [facility];  Service: Vascular;  Laterality: Left;   Family History:  Family History  Problem Relation Age of Onset  . Diabetes Mother    Family Psychiatric  History: See previous Social History:  Social History   Substance and Sexual Activity  Alcohol Use No     Social History   Substance and Sexual Activity  Drug Use No    Social History   Socioeconomic History  . Marital status: Single    Spouse name: Not on file  . Number of children: Not on file  . Years of education: Not on file  . Highest education level: Not on file  Occupational History  . Occupation: Disabled  Social Needs  . Financial resource strain: Not on file  . Food insecurity    Worry: Not on file    Inability: Not on file  . Transportation needs    Medical: Not on file    Non-medical: Not on file  Tobacco Use  . Smoking status: Former Smoker    Types: Cigarettes  . Smokeless tobacco: Never Used  Substance and Sexual Activity  . Alcohol use: No  . Drug use: No  .  Sexual activity: Never  Lifestyle  . Physical activity    Days per week: Not on file    Minutes per session: Not on file  . Stress: Not on file  Relationships  . Social Herbalist on phone: Not on file    Gets together: Not on file    Attends religious service: Not on file    Active member of club or organization: Not on file    Attends meetings of clubs or organizations: Not on file    Relationship status: Not on file  Other Topics Concern  . Not on file  Social  History Narrative   ** Merged History Encounter **       Additional Social History:                         Sleep: Fair  Appetite:  Fair  Current Medications: Current Facility-Administered Medications  Medication Dose Route Frequency Provider Last Rate Last Dose  . acetaminophen (TYLENOL) tablet 650 mg  650 mg Oral Q6H PRN Patrecia Pour, NP      . alum & mag hydroxide-simeth (MAALOX/MYLANTA) 200-200-20 MG/5ML suspension 30 mL  30 mL Oral Q4H PRN Patrecia Pour, NP   30 mL at 09/05/18 1509  . clopidogrel (PLAVIX) tablet 75 mg  75 mg Oral Daily Patrecia Pour, NP   75 mg at 09/05/18 0815  . cloZAPine (CLOZARIL) tablet 300 mg  300 mg Oral QHS Johnn Hai, MD   300 mg at 09/04/18 2149  . divalproex (DEPAKOTE) DR tablet 500 mg  500 mg Oral BID Patrecia Pour, NP   500 mg at 09/05/18 0815  . gabapentin (NEURONTIN) capsule 300 mg  300 mg Oral TID Johnn Hai, MD   300 mg at 09/05/18 1251  . haloperidol (HALDOL) tablet 5 mg  5 mg Oral Q6H PRN Johnn Hai, MD   5 mg at 09/04/18 2149   Or  . haloperidol lactate (HALDOL) injection 10 mg  10 mg Intramuscular Q6H PRN Johnn Hai, MD      . LORazepam (ATIVAN) tablet 1 mg  1 mg Oral Q6H PRN Johnn Hai, MD   1 mg at 09/05/18 1409   Or  . LORazepam (ATIVAN) injection 2 mg  2 mg Intramuscular Q4H PRN Johnn Hai, MD   2 mg at 09/03/18 0044  . LORazepam (ATIVAN) tablet 1 mg  1 mg Oral BID Johnn Hai, MD   1 mg at 09/05/18 0814  . magnesium hydroxide (MILK OF MAGNESIA) suspension 30 mL  30 mL Oral Daily PRN Patrecia Pour, NP   30 mL at 08/31/18 2320  . pantoprazole (PROTONIX) EC tablet 40 mg  40 mg Oral BID Johnn Hai, MD   40 mg at 09/05/18 0815  . zolpidem (AMBIEN) tablet 5 mg  5 mg Oral QHS [name], [name], MD   5 mg at 09/04/18 2149    Lab Results: No results found for this or any previous visit (from the past 48 hour(s)).  Blood Alcohol level:  Lab Results  Component Value Date   ETH <10 08/31/2018   ETH <10  32/20/2542    Metabolic Disorder Labs: Lab Results  Component Value Date   HGBA1C 5.4 09/30/2016   MPG 108.28 09/30/2016   MPG 114 09/17/2016   No results found for: PROLACTIN Lab Results  Component Value Date   CHOL 135 09/30/2016   TRIG 104 02/25/2018   HDL 60  09/30/2016   CHOLHDL 2.3 09/30/2016   VLDL 12 09/30/2016   LDLCALC 63 09/30/2016    Physical Findings: AIMS: Facial and Oral Movements Muscles of Facial Expression: None, normal Lips and Perioral Area: None, normal Jaw: None, normal Tongue: None, normal,Extremity Movements Upper (arms, wrists, hands, fingers): None, normal Lower (legs, knees, ankles, toes): None, normal, Trunk Movements Neck, shoulders, hips: None, normal, Overall Severity Severity of abnormal movements (highest score from questions above): None, normal Incapacitation due to abnormal movements: None, normal Patient's awareness of abnormal movements (rate only patient's report): No Awareness, Dental Status Current problems with teeth and/or dentures?: No Does patient usually wear dentures?: No  CIWA:  CIWA-Ar Total: 2 COWS:  COWS Total Score: 4  Musculoskeletal: Strength & Muscle Tone: within normal limits Gait & Station: normal Patient leans: N/A  Psychiatric Specialty Exam: Physical Exam  Nursing note and vitals reviewed. Constitutional: He appears well-developed and well-nourished.  HENT:  Head: Normocephalic and atraumatic.  Eyes: Pupils are equal, round, and reactive to light. Conjunctivae are normal.  Neck: Normal range of motion.  Cardiovascular: Normal heart sounds.  Respiratory: Effort normal.  GI: Soft.  Musculoskeletal: Normal range of motion.  Neurological: He is alert.  Skin: Skin is warm and dry.  Psychiatric: His affect is blunt. His speech is delayed. He is slowed. Cognition and memory are impaired. He expresses impulsivity and inappropriate judgment. He expresses no homicidal and no suicidal ideation.    Review of  Systems  Constitutional: Negative.   HENT: Negative.   Eyes: Negative.   Respiratory: Negative.   Cardiovascular: Negative.   Gastrointestinal: Negative.   Musculoskeletal: Negative.   Skin: Negative.   Neurological: Negative.   Psychiatric/Behavioral: Negative for depression, hallucinations, memory loss, substance abuse and suicidal ideas. The patient is not nervous/anxious and does not have insomnia.     Blood pressure 108/77, pulse (!) 117, temperature 98.2 F (36.8 C), temperature source Oral, resp. rate 15, height 5\' 11"  (1.803 m), weight 67.1 kg, SpO2 100 %.Body mass index is 20.64 kg/m.  General Appearance: Casual  Eye Contact:  Fair  Speech:  Clear and Coherent  Volume:  Normal  Mood:  Euthymic  Affect:  Constricted  Thought Process:  Coherent  Orientation:  Full (Time, Place, and Person)  Thought Content:  Logical  Suicidal Thoughts:  No  Homicidal Thoughts:  No  Memory:  Immediate;   Fair Recent;   Fair Remote;   Fair  Judgement:  Fair  Insight:  Fair  Psychomotor Activity:  Normal  Concentration:  Concentration: Fair  Recall:  [employer] of Knowledge:  Fair  Language:  Fair  Akathisia:  No  Handed:  Right  AIMS (if indicated):     Assets:  Desire for Improvement Housing Physical Health Resilience Social Support  ADL's:  Intact  Cognition:  WNL  Sleep:  Number of Hours: 7.25     Treatment Plan Summary: Daily contact with patient to assess and evaluate symptoms and progress in treatment, Medication management and Plan No change to treatment plan no change to medicine.  Spoke with treatment team today.  Also spoke with the care manager outside the hospital.  There is a plan for a full meeting of people who are involved in decision-making tomorrow at 230.  Until then it is not possible to discharge the patient as his current living situation is not safe.  He also cannot be taken off of a one-to-one sitter here in the hospital because he is at such  high risk  for falls.  Alethia Berthold, MD 09/05/2018, 3:29 PM

## 2018-09-05 NOTE — Progress Notes (Signed)
Austin Gates has continued to show improvement. His speech is much clearer and his physical strength and coordination seems better also. He still seems delayed in responses and is not redirectable. He got a prn for anxiety this afternoon because he was getting loud and irritable when his request were denied. [name] RN

## 2018-09-05 NOTE — BHH Counselor (Signed)
CSW received a call from the patient's legal guardian asking for update on the patient.  CSW informed that at this time CSW does not have an update.  Patient's guardian reports that she was informed that [facility] has placement for the pt.  CSW informed that to her knowledge [facility] is an outpatient facility for therapy and medications.  Guardian reports that she will follow up on her own.    Assunta Curtis, MSW, LCSW 09/05/2018 11:26 AM

## 2018-09-05 NOTE — BHH Counselor (Signed)
CSW notes that patient was expected to discharge yesterday to his current group home.  Group home reports that they would continue to look for placement within the company that would allow for the patient to have 24 hour supervision as that has become their primary concern.  Transportation was arranged and FL2 completed.  However, patient was not discharged following a conversation between psychiatrist and the patient's legal guardian.  CSW will follow up with the patient treatment team to assess where they are in placement for the patient.   Assunta Curtis, MSW, LCSW 09/05/2018 9:28 AM

## 2018-09-05 NOTE — BHH Group Notes (Signed)
LCSW Group Therapy Note  09/05/2018 11:48 AM  Type of Therapy/Topic:  Group Therapy:  Emotion Regulation  Participation Level:  Did Not Attend   Description of Group:   The purpose of this group is to assist patients in learning to regulate negative emotions and experience positive emotions. Patients will be guided to discuss ways in which they have been vulnerable to their negative emotions. These vulnerabilities will be juxtaposed with experiences of positive emotions or situations, and patients will be challenged to use positive emotions to combat negative ones. Special emphasis will be placed on coping with negative emotions in conflict situations, and patients will process healthy conflict resolution skills.  Therapeutic Goals: 1. Patient will identify two positive emotions or experiences to reflect on in order to balance out negative emotions 2. Patient will label two or more emotions that they find the most difficult to experience 3. Patient will demonstrate positive conflict resolution skills through discussion and/or role plays  Summary of Patient Progress: x   Therapeutic Modalities:   Cognitive Behavioral Therapy Feelings Identification Dialectical Behavioral Therapy   [name], MSW, LCSW Clinical Social Work 09/05/2018 11:48 AM

## 2018-09-05 NOTE — Plan of Care (Signed)
Pt denies depression, anxiety,SI, Hi and AVH. Pt and staff have been educated on prevention for fall risk . A wheelchair and gait belt are being used to prevent falls. Pt was educated on care plan and verbalizes understanding. [name] RN Problem: Activity: Goal: Will identify at least one activity in which they can participate Outcome: Progressing   Problem: Coping: Goal: Ability to identify and develop effective coping behavior will improve Outcome: Progressing Goal: Ability to interact with others will improve Outcome: Progressing Goal: Demonstration of participation in decision-making regarding own care will improve Outcome: Progressing Goal: Ability to use eye contact when communicating with others will improve Outcome: Progressing   Problem: Health Behavior/Discharge Planning: Goal: Identification of resources available to assist in meeting health care needs will improve Outcome: Progressing   Problem: Self-Concept: Goal: Will verbalize positive feelings about self Outcome: Progressing   Problem: Education: Goal: Utilization of techniques to improve thought processes will improve Outcome: Progressing Goal: Knowledge of the prescribed therapeutic regimen will improve Outcome: Progressing   Problem: Activity: Goal: Interest or engagement in leisure activities will improve Outcome: Progressing Goal: Imbalance in normal sleep/wake cycle will improve Outcome: Progressing   Problem: Coping: Goal: Coping ability will improve Outcome: Progressing Goal: Will verbalize feelings Outcome: Progressing   Problem: Health Behavior/Discharge Planning: Goal: Ability to make decisions will improve Outcome: Progressing Goal: Compliance with therapeutic regimen will improve Outcome: Progressing   Problem: Role Relationship: Goal: Will demonstrate positive changes in social behaviors and relationships Outcome: Progressing   Problem: Safety: Goal: Ability to disclose and  discuss suicidal ideas will improve Outcome: Progressing Goal: Ability to identify and utilize support systems that promote safety will improve Outcome: Progressing   Problem: Self-Concept: Goal: Will verbalize positive feelings about self Outcome: Progressing Goal: Level of anxiety will decrease Outcome: Progressing   Problem: Education: Goal: Ability to verbalize precipitating factors for violent behavior will improve Outcome: Progressing   Problem: Coping: Goal: Ability to verbalize frustrations and anger appropriately will improve Outcome: Progressing   Problem: Health Behavior/Discharge Planning: Goal: Ability to implement measures to prevent violent behavior in the future will improve Outcome: Progressing   Problem: Safety: Goal: Ability to demonstrate self-control will improve Outcome: Progressing Goal: Ability to redirect hostility and anger into socially appropriate behaviors will improve Outcome: Progressing

## 2018-09-06 NOTE — BHH Counselor (Signed)
CSW was on conference call with Zipporah Plants, Care Coordinator, [name], legal guardian,  [facility] staff and Monico Hoar.    The following was discussed: -Patient has been tentatively accepted by [facility]. -Need until Monday 7/20 to acclimate the staff to the patient, his health and gait concerns, train the staff on the patient and allow the guardian to tour to approve.  -This is the only option at this time.   CSW was asked if Monday would be acceptable, she pointed out that she does not decide discharges, however will staff with Lissa Merlin and Dr. Weber Cooks.  CSW pointed out that patient appears at his baseline and appears to be doing well and was previously cleared for a discharge.  Assunta Curtis, MSW, LCSW 09/06/2018 3:01 PM

## 2018-09-06 NOTE — Progress Notes (Signed)
[facility] MD Progress Note  09/06/2018 5:20 PM Austin Gates  MRN:  [id number] Subjective: Patient seen chart reviewed.  Patient has no new complaints.  He has been awake all day.  He is able to sit in the milieu without having major behavior problems.  Speech of course is still limited to only a handful of topics.  Still needs almost constant attention.  Still has high fall risk.  Physically seems to stabilized. Principal Problem: Schizoaffective disorder, bipolar type ([place]) Diagnosis: Principal Problem:   Schizoaffective disorder, bipolar type ([place]) Active Problems:   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   Protein-calorie malnutrition, severe  Total Time spent with patient: 20 minutes  Past Psychiatric History: Lifelong developmental disability chronic behavior problems recent decline in functioning.  Past Medical History:  Past Medical History:  Diagnosis Date  . Hypertension   . Intellectual disability   . Obsessive-compulsive disorder   . Schizo-affective schizophrenia [facility])     Past Surgical History:  Procedure Laterality Date  . COLONOSCOPY WITH PROPOFOL N/A 12/28/2017   Procedure: COLONOSCOPY WITH PROPOFOL;  Surgeon: Jonathon Bellows, MD;  Location: [facility] ENDOSCOPY;  Service: Gastroenterology;  Laterality: N/A;  . HYDROCELE EXCISION Bilateral 02/22/2018   Procedure: bilateral HYDROCELECTOMY ADULT;  Surgeon: Cleon Gustin, MD;  Location: [place];  Service: Urology;  Laterality: Bilateral;  . INSERTION OF ILIAC STENT Left 02/22/2018   Procedure: INSERTION LEFT SUPERIOR FEMORAL ARTERY USING 6MM X 5CM VIABHON STENT with mynx device closure on right femoral artery;  Surgeon: Waynetta Sandy, MD;  Location: [place];  Service: Vascular;  Laterality: Left;  . KNEE ARTHROSCOPY    . SCROTAL EXPLORATION N/A 02/22/2018   Procedure: SCROTUM EXPLORATION;  Surgeon: Cleon Gustin, MD;  Location: [place];  Service: Urology;  Laterality: N/A;  . UPPER EXTREMITY ANGIOGRAM Left 02/22/2018    Procedure: left lower EXTREMITY ANGIOGRAM;  Surgeon: Waynetta Sandy, MD;  [facility];  Service: Vascular;  Laterality: Left;   Family History:  Family History  Problem Relation Age of Onset  . Diabetes Mother    Family Psychiatric  History: Unknown Social History:  Social History   Substance and Sexual Activity  Alcohol Use No     Social History   Substance and Sexual Activity  Drug Use No    Social History   Socioeconomic History  . Marital status: Single    Spouse name: Not on file  . Number of children: Not on file  . Years of education: Not on file  . Highest education level: Not on file  Occupational History  . Occupation: Disabled  Social Needs  . Financial resource strain: Not on file  . Food insecurity    Worry: Not on file    Inability: Not on file  . Transportation needs    Medical: Not on file    Non-medical: Not on file  Tobacco Use  . Smoking status: Former Smoker    Types: Cigarettes  . Smokeless tobacco: Never Used  Substance and Sexual Activity  . Alcohol use: No  . Drug use: No  . Sexual activity: Never  Lifestyle  . Physical activity    Days per week: Not on file    Minutes per session: Not on file  . Stress: Not on file  Relationships  . Social Herbalist on phone: Not on file    Gets together: Not on file    Attends religious service: Not on file    Active member of  club or organization: Not on file    Attends meetings of clubs or organizations: Not on file    Relationship status: Not on file  Other Topics Concern  . Not on file  Social History Narrative   ** Merged History Encounter **       Additional Social History:                         Sleep: Fair  Appetite:  Good  Current Medications: Current Facility-Administered Medications  Medication Dose Route Frequency Provider Last Rate Last Dose  . acetaminophen (TYLENOL) tablet 650 mg  650 mg Oral Q6H PRN Patrecia Pour, NP      . alum  & mag hydroxide-simeth (MAALOX/MYLANTA) 200-200-20 MG/5ML suspension 30 mL  30 mL Oral Q4H PRN Patrecia Pour, NP   30 mL at 09/05/18 1509  . clopidogrel (PLAVIX) tablet 75 mg  75 mg Oral Daily Patrecia Pour, NP   75 mg at 09/06/18 5732  . cloZAPine (CLOZARIL) tablet 300 mg  300 mg Oral QHS Johnn Hai, MD   300 mg at 09/05/18 2128  . divalproex (DEPAKOTE) DR tablet 500 mg  500 mg Oral BID Patrecia Pour, NP   500 mg at 09/06/18 1716  . feeding supplement (ENSURE ENLIVE) (ENSURE ENLIVE) liquid 237 mL  237 mL Oral BID BM Money, [name], [name]   237 mL at 09/06/18 1417  . gabapentin (NEURONTIN) capsule 300 mg  300 mg Oral TID Johnn Hai, MD   300 mg at 09/06/18 1716  . haloperidol (HALDOL) tablet 5 mg  5 mg Oral Q6H PRN Johnn Hai, MD   5 mg at 09/04/18 2149   Or  . haloperidol lactate (HALDOL) injection 10 mg  10 mg Intramuscular Q6H PRN Johnn Hai, MD      . LORazepam (ATIVAN) tablet 1 mg  1 mg Oral Q6H PRN Johnn Hai, MD   1 mg at 09/05/18 1409   Or  . LORazepam (ATIVAN) injection 2 mg  2 mg Intramuscular Q4H PRN Johnn Hai, MD   2 mg at 09/03/18 0044  . LORazepam (ATIVAN) tablet 1 mg  1 mg Oral BID Johnn Hai, MD   1 mg at 09/06/18 1716  . magnesium hydroxide (MILK OF MAGNESIA) suspension 30 mL  30 mL Oral Daily PRN Patrecia Pour, NP   30 mL at 08/31/18 2320  . pantoprazole (PROTONIX) EC tablet 40 mg  40 mg Oral BID Johnn Hai, MD   40 mg at 09/06/18 1716  . zolpidem (AMBIEN) tablet 5 mg  5 mg Oral QHS Alaiza Yau, [name], MD   5 mg at 09/05/18 2128    Lab Results: No results found for this or any previous visit (from the past 49 hour(s)).  Blood Alcohol level:  Lab Results  Component Value Date   ETH <10 08/31/2018   ETH <10 20/25/4270    Metabolic Disorder Labs: Lab Results  Component Value Date   HGBA1C 5.4 09/30/2016   MPG 108.28 09/30/2016   MPG 114 09/17/2016   No results found for: PROLACTIN Lab Results  Component Value Date   CHOL 135 09/30/2016   TRIG  104 02/25/2018   HDL 60 09/30/2016   CHOLHDL 2.3 09/30/2016   VLDL 12 09/30/2016   LDLCALC 63 09/30/2016    Physical Findings: AIMS: Facial and Oral Movements Muscles of Facial Expression: None, normal Lips and Perioral Area: None, normal Jaw: None, normal Tongue: None,  normal,Extremity Movements Upper (arms, wrists, hands, fingers): None, normal Lower (legs, knees, ankles, toes): None, normal, Trunk Movements Neck, shoulders, hips: None, normal, Overall Severity Severity of abnormal movements (highest score from questions above): None, normal Incapacitation due to abnormal movements: None, normal Patient's awareness of abnormal movements (rate only patient's report): No Awareness, Dental Status Current problems with teeth and/or dentures?: No Does patient usually wear dentures?: No  CIWA:  CIWA-Ar Total: 2 COWS:  COWS Total Score: 4  Musculoskeletal: Strength & Muscle Tone: decreased Gait & Station: unsteady Patient leans: N/A  Psychiatric Specialty Exam: Physical Exam  Nursing note and vitals reviewed. Constitutional: He appears well-developed and well-nourished.  HENT:  Head: Normocephalic and atraumatic.  Eyes: Pupils are equal, round, and reactive to light. Conjunctivae are normal.  Neck: Normal range of motion.  Cardiovascular: Regular rhythm and normal heart sounds.  Respiratory: Effort normal.  GI: Soft.  Musculoskeletal: Normal range of motion.  Neurological: He is alert.  Skin: Skin is warm and dry.  Psychiatric: His affect is labile. His speech is tangential. He is agitated. He is not aggressive. Thought content is not paranoid. Cognition and memory are impaired. He expresses impulsivity. He expresses no homicidal and no suicidal ideation.    Review of Systems  Constitutional: Negative.   HENT: Negative.   Eyes: Negative.   Respiratory: Negative.   Cardiovascular: Negative.   Gastrointestinal: Negative.   Musculoskeletal: Negative.   Skin: Negative.    Neurological: Negative.   Psychiatric/Behavioral: Negative.     Blood pressure 124/88, pulse (!) 110, temperature 98.5 F (36.9 C), temperature source Oral, resp. rate 18, height 5\' 11"  (1.803 m), weight 67.1 kg, SpO2 93 %.Body mass index is 20.64 kg/m.  General Appearance: Disheveled  Eye Contact:  Fair  Speech:  Garbled and Slurred  Volume:  Decreased  Mood:  Euthymic  Affect:  Constricted  Thought Process:  Disorganized  Orientation:  Negative  Thought Content:  Tangential  Suicidal Thoughts:  No  Homicidal Thoughts:  No  Memory:  Immediate;   Fair Recent;   Poor Remote;   Poor  Judgement:  Impaired  Insight:  Shallow  Psychomotor Activity:  Decreased  Concentration:  Concentration: Poor  Recall:  Poor  Fund of Knowledge:  Poor  Language:  Poor  Akathisia:  No  Handed:  Right  AIMS (if indicated):     Assets:  Desire for Improvement Social Support  ADL's:  Impaired  Cognition:  Impaired,  Moderate  Sleep:  Number of Hours: 8     Treatment Plan Summary: Daily contact with patient to assess and evaluate symptoms and progress in treatment, Medication management and Plan Patient seems to be coming back closer to his baseline.  Behavior is pretty well contained and he is not appearing to be oversedated.  Spoke with social work and reviewed notes today that it looks like there is a good chance that care management may have found a disposition for him by next week.  We will start making arrangements for this.  No change to medicine today.  Alethia Berthold, MD 09/06/2018, 5:20 PM

## 2018-09-06 NOTE — BHH Group Notes (Signed)
LCSW Group Therapy Note  09/06/2018 1:00 PM  Type of Therapy/Topic:  Group Therapy:  Balance in Life  Participation Level:  Active  Description of Group:    This group will address the concept of balance and how it feels and looks when one is unbalanced. Patients will be encouraged to process areas in their lives that are out of balance and identify reasons for remaining unbalanced. Facilitators will guide patients in utilizing problem-solving interventions to address and correct the stressor making their life unbalanced. Understanding and applying boundaries will be explored and addressed for obtaining and maintaining a balanced life. Patients will be encouraged to explore ways to assertively make their unbalanced needs known to significant others in their lives, using other group members and facilitator for support and feedback.  Therapeutic Goals: 1. Patient will identify two or more emotions or situations they have that consume much of in their lives. 2. Patient will identify signs/triggers that life has become out of balance:  3. Patient will identify two ways to set boundaries in order to achieve balance in their lives:  4. Patient will demonstrate ability to communicate their needs through discussion and/or role plays  Summary of Patient Progress: Patient was present in group.  Patient was attentive in group.  Patient, however, struggled with processing in group.  Patient kept looking at his 1:1 sitter, asking "What should I say?", "Tell me what to say."    Therapeutic Modalities:   Cognitive Behavioral Therapy Solution-Focused Therapy Assertiveness Training  Assunta Curtis MSW, LCSW 09/06/2018 10:36 AM

## 2018-09-06 NOTE — Plan of Care (Signed)
Patient appropriate with 1:1 sitter.

## 2018-09-06 NOTE — Progress Notes (Signed)
Patient is for vital signs stable and alert, poor gait and under fall precautions, 1&1 in place per protocol noted noted distress.

## 2018-09-06 NOTE — Progress Notes (Signed)
Patient is resting comfortably and without distress, safety is maintained and 1&1 is in place for safety.

## 2018-09-06 NOTE — Progress Notes (Addendum)
10:00 Patient in wheelchair.Safety sitter patient.Deneies SI,HI and AVH,Compliant with medications.Appetite and energy level good.  14:00 Patient appropriate with sitter.  18:00 Patient appropriate with sitter.

## 2018-09-06 NOTE — Progress Notes (Signed)
Recreation Therapy Notes  Date: 09/06/2018  Time: 9:30 am  Location: Craft room  Behavioral response: Appropriate   Intervention Topic: Self-esteem  Discussion/Intervention:  Group content today was focused on self-esteem. Patient defined self-esteem and where it comes form. The group described reasons self-esteem is important. Individuals stated things that impact self-esteem and positive ways to improve self-esteem. The group participated in the intervention "Improving Me" where patients were able to identify ways, they could improve their self-esteem. Clinical Observations/Feedback:  Patient came to group and explained the self-care, hygiene, and friends can impact your self-esteem. He stated that having negative self-esteem can affect your metal health. Individual was social with peers and staff while participating in the intervention.  Aldene Hendon LRT/CTRS         Josel Keo 09/06/2018 11:01 AM

## 2018-09-07 LAB — CBC WITH DIFFERENTIAL/PLATELET
Abs Immature Granulocytes: 0.22 10*3/uL — ABNORMAL HIGH (ref 0.00–0.07)
Basophils Absolute: 0 10*3/uL (ref 0.0–0.1)
Basophils Relative: 0 %
Eosinophils Absolute: 0.1 10*3/uL (ref 0.0–0.5)
Eosinophils Relative: 2 %
HCT: 38.1 % — ABNORMAL LOW (ref 39.0–52.0)
Hemoglobin: 11.8 g/dL — ABNORMAL LOW (ref 13.0–17.0)
Immature Granulocytes: 3 %
Lymphocytes Relative: 16 %
Lymphs Abs: 1.1 10*3/uL (ref 0.7–4.0)
MCH: 29.6 pg (ref 26.0–34.0)
MCHC: 31 g/dL (ref 30.0–36.0)
MCV: 95.5 fL (ref 80.0–100.0)
Monocytes Absolute: 0.6 10*3/uL (ref 0.1–1.0)
Monocytes Relative: 9 %
Neutro Abs: 4.6 10*3/uL (ref 1.7–7.7)
Neutrophils Relative %: 70 %
Platelets: 209 10*3/uL (ref 150–400)
RBC: 3.99 MIL/uL — ABNORMAL LOW (ref 4.22–5.81)
RDW: 14.4 % (ref 11.5–15.5)
WBC: 6.7 10*3/uL (ref 4.0–10.5)
nRBC: 0.4 % — ABNORMAL HIGH (ref 0.0–0.2)

## 2018-09-07 MED ORDER — LORAZEPAM 1 MG PO TABS: 1.0000 mg | ORAL_TABLET | Freq: Two times a day (BID) | ORAL | 2 refills | Status: DC

## 2018-09-07 MED ORDER — DIVALPROEX SODIUM 500 MG PO DR TAB: 500.0000 mg | DELAYED_RELEASE_TABLET | Freq: Two times a day (BID) | ORAL | 2 refills | Status: DC

## 2018-09-07 MED ORDER — CLOZAPINE 100 MG PO TABS: 300.0000 mg | ORAL_TABLET | Freq: Every day | ORAL | 2 refills | Status: DC

## 2018-09-07 MED ORDER — CLOPIDOGREL BISULFATE 75 MG PO TABS: 75.0000 mg | ORAL_TABLET | Freq: Every day | ORAL | 2 refills | Status: DC

## 2018-09-07 MED ORDER — PANTOPRAZOLE SODIUM 40 MG PO TBEC: 40.0000 mg | DELAYED_RELEASE_TABLET | Freq: Two times a day (BID) | ORAL | 2 refills | Status: DC

## 2018-09-07 MED ORDER — GABAPENTIN 300 MG PO CAPS: 300.0000 mg | ORAL_CAPSULE | Freq: Three times a day (TID) | ORAL | 2 refills | Status: DC

## 2018-09-07 MED ORDER — ZOLPIDEM TARTRATE 5 MG PO TABS: 5.0000 mg | ORAL_TABLET | Freq: Every day | ORAL | 2 refills | Status: DC

## 2018-09-07 NOTE — BHH Suicide Risk Assessment (Signed)
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   HENT: Negative.   Eyes: Negative.   Respiratory: Negative.   Cardiovascular: Negative.   Gastrointestinal: Negative.   Musculoskeletal: Negative.   Skin: Negative.   Neurological: Negative.   Psychiatric/Behavioral: Negative.     Blood pressure (!) 134/99, pulse 99, temperature 99.1 F (37.3 C), temperature source Oral, resp. rate 18, height 5\' 11"  (7.858 m), weight 67.1 kg, SpO2 97 %.Body mass index is 20.64 kg/m.  General Appearance: Casual  Eye Contact::  Fair  Speech:  Slow and Slurred409  Volume:  Decreased  Mood:  Euthymic  Affect:  Constricted  Thought Process:  Coherent  Orientation:  Negative  Thought Content:  Rumination and Tangential  Suicidal Thoughts:  No  Homicidal Thoughts:  No  Memory:  Immediate;   Fair Recent;   Poor Remote;   Poor  Judgement:  Impaired  Insight:  Lacking  Psychomotor Activity:  Decreased  Concentration:  Poor  Recall:  Poor  Fund of Knowledge:Poor  Language: Poor  Akathisia:  No  Handed:  Right  AIMS (if indicated):     Assets:  Desire for Improvement Social Support  Sleep:  Number of Hours: 7.5  Cognition: Impaired,  Moderate  ADL's:  Impaired   Mental Status Per Nursing Assessment::   On Admission:  NA  Demographic Factors:  Male  Loss Factors: NA  Historical Factors: NA  Risk Reduction Factors:   Living with another person, especially a relative, Positive social support and Positive therapeutic  relationship  Continued Clinical Symptoms:  Schizophrenia:   Paranoid or undifferentiated type  Cognitive Features That Contribute To Risk:  None    Suicide Risk:  Minimal: No identifiable suicidal ideation.  Patients presenting with no risk factors but with morbid ruminations; may be classified as minimal risk based on the severity of the depressive symptoms  [place], [facility] Follow up on 09/13/2018.   Why: You have an appoitnment scheduled with Dr. Loni Muse on 09/13/18 at 11:45AM. It will be a virtual appointment. Thank You! Contact information: [address] [place] [address] [phone]           Plan Of Care/Follow-up recommendations:  Activity:  Activity as tolerated Diet:  Regular diet Other:  Follow-up with neuropsychiatric care as usual continue current medicine.  Alethia Berthold, MD 09/07/2018, 12:42 PM

## 2018-09-07 NOTE — BHH Counselor (Signed)
CSW spoke with the patient's care coordinator to let him know that July 20th was approved for discharge.    Care Coordinator reports that the date may no longer be effective as [facility] is requesting new information and it is unlikely that it can be produced before Monday to allow for admission.  Some of the things being requested are immunization history, covid screening and prior authorization among other things.  CSW informed that Covid testing was performed at hospital and could be provided.  CSW obtained fax information to send the results.  CSW called guardian to inform that the 20th was accepted however there are new barriers. CSW informed that it is possible that the patient to be discharged on Monday and patient may be released to the guardian.  CSW assessed for any barriers that would make it unsafe for patient at guardians home such as stairs.  Guardian reports that there are no stairs, however, she has not been trained on how to care for the patient.   Assunta Curtis, MSW, LCSW 09/07/2018 10:31 AM

## 2018-09-07 NOTE — BHH Group Notes (Signed)
Balance In Life 09/07/2018 1PM  Type of Therapy/Topic:  Group Therapy:  Balance in Life  Participation Level:  Minimal  Description of Group:   This group will address the concept of balance and how it feels and looks when one is unbalanced. Patients will be encouraged to process areas in their lives that are out of balance and identify reasons for remaining unbalanced. Facilitators will guide patients in utilizing problem-solving interventions to address and correct the stressor making their life unbalanced. Understanding and applying boundaries will be explored and addressed for obtaining and maintaining a balanced life. Patients will be encouraged to explore ways to assertively make their unbalanced needs known to significant others in their lives, using other group members and facilitator for support and feedback.  Therapeutic Goals: 1. Patient will identify two or more emotions or situations they have that consume much of in their lives. 2. Patient will identify signs/triggers that life has become out of balance:  3. Patient will identify two ways to set boundaries in order to achieve balance in their lives:  4. Patient will demonstrate ability to communicate their needs through discussion and/or role plays  Summary of Patient Progress:  Pt attended session but minimal input. Pt had difficulty understanding and following today's topic. Pt sat quietly and respected boundaries of group.   Therapeutic Modalities:   Cognitive Behavioral Therapy Solution-Focused Therapy Assertiveness Training  Annabell Oconnor [name], LCSW

## 2018-09-07 NOTE — Progress Notes (Signed)
Recreation Therapy Notes  Date: 09/07/2018  Time: 9:30 am  Location: Craft room  Behavioral response: Appropriate   Intervention Topic: Leisure   Discussion/Intervention:  Group content today was focused on leisure. The group defined what leisure is and some positive leisure activities they participate in. Individuals identified the difference between good and bad leisure. Participants expressed how they feel after participating in the leisure of their choice. The group discussed how they go about picking a leisure activity and if others are involved in their leisure activities. The patient stated how many leisure activities they too choose from and reasons why it is important to have leisure time. Individuals participated in the intervention "Exploration of Leisure" where they had a chance to identify new leisure activities as well as benefits of leisure. Clinical Observations/Feedback:  Patient came to group and was pulled for labs. He later returned and identified playing spades and listening to R&B music as leisure activities he enjoys. Individual was social with peers and staff while participating in the intervention.  Edelin Fryer LRT/CTRS         Kerrilynn Derenzo 09/07/2018 10:59 AM

## 2018-09-07 NOTE — Progress Notes (Signed)
Clozapine Monitoring Note   7/17 [place] [address]  Lab reported to clozapine registry Pt is eligible to receive clozapine Next labs due in one week per hospital [place], [place] Pharmacist 09/07/2018 1:03 PM

## 2018-09-07 NOTE — Plan of Care (Signed)
Patient is appropriate in the unit.Denies SI,HI and AVH.Compliant with medications.Attended groups.Appetite and energy level good.Support and encouragement given.Safety maintained with sitter.

## 2018-09-07 NOTE — Progress Notes (Signed)
Patient did not fall this shift , remain safe from fall with 1&1 fall protocol, sleep is long without interruptions.

## 2018-09-07 NOTE — Progress Notes (Signed)
[facility] MD Progress Note  09/07/2018 12:37 PM Austin Gates  MRN:  [id number] Subjective: Follow-up for this patient with moderate intellectual disability and schizoaffective disorder.  Patient himself had no new complaints.  The only thing he wanted to tell me today was that he wanted to go to a group home and would still like some peanut butter and graham crackers.  Patient's behavior has proved and stabilized.  He is not bizarre or aggressive.  Does not appear to be responding to internal stimuli.  Able to be out in the milieu around other people and interact without inappropriate behavior.  Still being kept in a wheelchair for now because of his high falls risk.  I spoke with his guardian this morning who tells me that the plan is still in place for discharge early next week although it may not be available Monday and may not be available until Tuesday.  Patient aware and has been cooperative.  No new physical issues. 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  Many hospitalizations over the course of his life.  Partial response to medicine.  Tends to be very fragile however and need close monitoring of medication  Past Medical History:  Past Medical History:  Diagnosis Date  . Hypertension   . Intellectual disability   . Obsessive-compulsive disorder   . Schizo-affective schizophrenia [facility])     Past Surgical History:  Procedure Laterality Date  . COLONOSCOPY WITH PROPOFOL N/A 12/28/2017   Procedure: COLONOSCOPY WITH PROPOFOL;  Surgeon: Jonathon Bellows, MD;  [facility];  Service: Gastroenterology;  Laterality: N/A;  . HYDROCELE EXCISION  Bilateral 02/22/2018   Procedure: bilateral HYDROCELECTOMY ADULT;  Surgeon: Cleon Gustin, MD;  Location: [place];  Service: Urology;  Laterality: Bilateral;  . INSERTION OF ILIAC STENT Left 02/22/2018   Procedure: INSERTION LEFT SUPERIOR FEMORAL ARTERY USING 6MM X 5CM VIABHON STENT with mynx device closure on right femoral artery;  Surgeon: Waynetta Sandy, MD;  Location: [place];  Service: Vascular;  Laterality: Left;  . KNEE ARTHROSCOPY    . SCROTAL EXPLORATION N/A 02/22/2018   Procedure: SCROTUM EXPLORATION;  Surgeon: Cleon Gustin, MD;  Location: [place];  Service: Urology;  Laterality: N/A;  . UPPER EXTREMITY ANGIOGRAM Left 02/22/2018   Procedure: left lower EXTREMITY ANGIOGRAM;  Surgeon: Waynetta Sandy, MD;  Location: [facility] OR;  Service: Vascular;  Laterality: Left;   Family History:  Family History  Problem Relation Age of Onset  . Diabetes Mother    Family Psychiatric  History: See previous Social History:  Social History   Substance and Sexual Activity  Alcohol Use No     Social History   Substance and Sexual Activity  Drug Use No    Social History   Socioeconomic History  . Marital status: Single    Spouse name: Not on file  . Number of children: Not on file  . Years of education: Not on file  . Highest education level: Not on file  Occupational History  . Occupation: Disabled  Social Needs  . Financial resource strain: Not on file  . Food insecurity    Worry: Not on file    Inability: Not on file  . Transportation needs  Medical: Not on file    Non-medical: Not on file  Tobacco Use  . Smoking status: Former Smoker    Types: Cigarettes  . Smokeless tobacco: Never Used  Substance and Sexual Activity  . Alcohol use: No  . Drug use: No  . Sexual activity: Never  Lifestyle  . Physical activity    Days per week: Not on file    Minutes per session: Not on file  . Stress: Not on file  Relationships  . Social Herbalist on  phone: Not on file    Gets together: Not on file    Attends religious service: Not on file    Active member of club or organization: Not on file    Attends meetings of clubs or organizations: Not on file    Relationship status: Not on file  Other Topics Concern  . Not on file  Social History Narrative   ** Merged History Encounter **       Additional Social History:                         Sleep: Fair  Appetite:  Good  Current Medications: Current Facility-Administered Medications  Medication Dose Route Frequency Provider Last Rate Last Dose  . acetaminophen (TYLENOL) tablet 650 mg  650 mg Oral Q6H PRN Patrecia Pour, NP      . alum & mag hydroxide-simeth (MAALOX/MYLANTA) 200-200-20 MG/5ML suspension 30 mL  30 mL Oral Q4H PRN Patrecia Pour, NP   30 mL at 09/05/18 1509  . clopidogrel (PLAVIX) tablet 75 mg  75 mg Oral Daily Patrecia Pour, NP   75 mg at 09/07/18 0623  . cloZAPine (CLOZARIL) tablet 300 mg  300 mg Oral QHS Johnn Hai, MD   300 mg at 09/06/18 2137  . divalproex (DEPAKOTE) DR tablet 500 mg  500 mg Oral BID Patrecia Pour, NP   500 mg at 09/07/18 0829  . feeding supplement (ENSURE ENLIVE) (ENSURE ENLIVE) liquid 237 mL  237 mL Oral BID BM Money, [name], [name]   237 mL at 09/06/18 1417  . gabapentin (NEURONTIN) capsule 300 mg  300 mg Oral TID Johnn Hai, MD   300 mg at 09/07/18 0828  . haloperidol (HALDOL) tablet 5 mg  5 mg Oral Q6H PRN Johnn Hai, MD   5 mg at 09/04/18 2149   Or  . haloperidol lactate (HALDOL) injection 10 mg  10 mg Intramuscular Q6H PRN Johnn Hai, MD      . LORazepam (ATIVAN) tablet 1 mg  1 mg Oral Q6H PRN Johnn Hai, MD   1 mg at 09/06/18 2137   Or  . LORazepam (ATIVAN) injection 2 mg  2 mg Intramuscular Q4H PRN Johnn Hai, MD   2 mg at 09/03/18 0044  . LORazepam (ATIVAN) tablet 1 mg  1 mg Oral BID Johnn Hai, MD   1 mg at 09/07/18 7628  . magnesium hydroxide (MILK OF MAGNESIA) suspension 30 mL  30 mL Oral Daily PRN Patrecia Pour, NP   30 mL at 08/31/18 2320  . pantoprazole (PROTONIX) EC tablet 40 mg  40 mg Oral BID Johnn Hai, MD   40 mg at 09/07/18 3151  . zolpidem (AMBIEN) tablet 5 mg  5 mg Oral QHS Renny Gunnarson, [name], MD   5 mg at 09/06/18 2136    Lab Results:  Results for orders placed or performed during the hospital encounter of 08/31/18 (  from the past 48 hour(s))  CBC with Differential/Platelet     Status: Abnormal   Collection Time: 09/07/18  9:49 AM  Result Value Ref Range   WBC 6.7 4.0 - 10.5 K/uL   RBC 3.99 (L) 4.22 - 5.81 MIL/uL   Hemoglobin 11.8 (L) 13.0 - 17.0 g/dL   HCT 38.1 (L) 39.0 - 52.0 %   MCV 95.5 80.0 - 100.0 fL   MCH 29.6 26.0 - 34.0 pg   MCHC 31.0 30.0 - 36.0 g/dL   RDW 14.4 11.5 - 15.5 %   Platelets 209 150 - 400 K/uL   nRBC 0.4 (H) 0.0 - 0.2 %   Neutrophils Relative % 70 %   Neutro Abs 4.6 1.7 - 7.7 K/uL   Lymphocytes Relative 16 %   Lymphs Abs 1.1 0.7 - 4.0 K/uL   Monocytes Relative 9 %   Monocytes Absolute 0.6 0.1 - 1.0 K/uL   Eosinophils Relative 2 %   Eosinophils Absolute 0.1 0.0 - 0.5 K/uL   Basophils Relative 0 %   Basophils Absolute 0.0 0.0 - 0.1 K/uL   Immature Granulocytes 3 %   Abs Immature Granulocytes 0.22 (H) 0.00 - 0.07 K/uL    Comment: Performed at [facility], [address] [place], [place] [address]    Blood Alcohol level:  Lab Results  Component Value Date   [facility] <10 08/31/2018   ETH <10 19/62/2297    Metabolic Disorder Labs: Lab Results  Component Value Date   HGBA1C 5.4 09/30/2016   MPG 108.28 09/30/2016   MPG 114 09/17/2016   No results found for: PROLACTIN Lab Results  Component Value Date   CHOL 135 09/30/2016   TRIG 104 02/25/2018   HDL 60 09/30/2016   CHOLHDL 2.3 09/30/2016   VLDL 12 09/30/2016   LDLCALC 63 09/30/2016    Physical Findings: AIMS: Facial and Oral Movements Muscles of Facial Expression: None, normal Lips and Perioral Area: None, normal Jaw: None, normal Tongue: None, normal,Extremity  Movements Upper (arms, wrists, hands, fingers): None, normal Lower (legs, knees, ankles, toes): None, normal, Trunk Movements Neck, shoulders, hips: None, normal, Overall Severity Severity of abnormal movements (highest score from questions above): None, normal Incapacitation due to abnormal movements: None, normal Patient's awareness of abnormal movements (rate only patient's report): No Awareness, Dental Status Current problems with teeth and/or dentures?: No Does patient usually wear dentures?: No  CIWA:  CIWA-Ar Total: 2 COWS:  COWS Total Score: 4  Musculoskeletal: Strength & Muscle Tone: decreased Gait & Station: unsteady Patient leans: N/A  Psychiatric Specialty Exam: Physical Exam  Nursing note and vitals reviewed. Constitutional: He appears well-developed and well-nourished.  HENT:  Head: Normocephalic and atraumatic.  Eyes: Pupils are equal, round, and reactive to light. Conjunctivae are normal.  Neck: Normal range of motion.  Cardiovascular: Regular rhythm and normal heart sounds.  Respiratory: Effort normal.  GI: Soft.  Musculoskeletal: Normal range of motion.  Neurological: He is alert.  Skin: Skin is warm and dry.  Psychiatric: His affect is blunt. His speech is delayed and slurred. He is not agitated and not aggressive. Thought content is not paranoid. Cognition and memory are impaired. He expresses inappropriate judgment. He expresses no homicidal and no suicidal ideation.    Review of Systems  Constitutional: Negative.   HENT: Negative.   Eyes: Negative.   Respiratory: Negative.   Cardiovascular: Negative.   Gastrointestinal: Negative.   Musculoskeletal: Negative.   Skin: Negative.   Neurological: Negative.   Psychiatric/Behavioral: Negative.  Blood pressure (!) 134/99, pulse 99, temperature 99.1 F (37.3 C), temperature source Oral, resp. rate 18, height 5\' 11"  (1.803 m), weight 67.1 kg, SpO2 97 %.Body mass index is 20.64 kg/m.  General Appearance:  Casual  Eye Contact:  Fair  Speech:  Slow and Slurred  Volume:  Decreased  Mood:  Euthymic  Affect:  Constricted  Thought Process:  Coherent  Orientation:  Negative  Thought Content:  Rumination and Tangential  Suicidal Thoughts:  No  Homicidal Thoughts:  No  Memory:  Immediate;   Fair Recent;   Fair Remote;   Poor  Judgement:  Impaired  Insight:  Lacking  Psychomotor Activity:  Decreased  Concentration:  Concentration: Poor  Recall:  Poor  Fund of Knowledge:  Poor  Language:  Poor  Akathisia:  No  Handed:  Right  AIMS (if indicated):     Assets:  Desire for Improvement Social Support  ADL's:  Impaired  Cognition:  Impaired,  Moderate  Sleep:  Number of Hours: 7.5     Treatment Plan Summary: Plan He does appear to be stabilized at this point.  I will review medications before the weekend.  If any levels need to be checked we can do that otherwise for the moment he seems stable enough that I will go ahead and prepare prescriptions.  Suicide does not appear to be an issue with this patient and I think I can go ahead and prepare a discharge suicide assessment.  Things will be in place for what we hope will be a discharge to a better environment in the early upcoming week.  Alethia Berthold, MD 09/07/2018, 12:37 PM

## 2018-09-07 NOTE — Tx Team (Signed)
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 acetaminophen (TYLENOL) tablet 650 mg  650 mg Oral Q6H PRN Patrecia Pour, NP      . alum & mag hydroxide-simeth (MAALOX/MYLANTA) 200-200-20 MG/5ML suspension 30 mL  30 mL Oral Q4H PRN Patrecia Pour, NP   30 mL at 09/05/18 1509  . clopidogrel (PLAVIX) tablet 75 mg  75 mg Oral Daily Patrecia Pour, NP   75 mg at 09/07/18 7048  . cloZAPine (CLOZARIL) tablet 300 mg  300 mg Oral QHS Johnn Hai, MD   300 mg at 09/06/18 2137  . divalproex (DEPAKOTE) DR tablet 500 mg  500 mg Oral BID Patrecia Pour, NP   500 mg at 09/07/18 0829  . feeding supplement (ENSURE ENLIVE) (ENSURE ENLIVE) liquid 237 mL  237 mL Oral BID BM Money, [name], [name]   237 mL at 09/06/18 1417  . gabapentin (NEURONTIN) capsule 300 mg  300 mg Oral TID Johnn Hai, MD   300 mg at 09/07/18 0828  . haloperidol (HALDOL) tablet 5 mg  5 mg Oral Q6H PRN Johnn Hai, MD   5 mg at 09/04/18 2149   Or  . haloperidol lactate (HALDOL) injection 10 mg  10 mg Intramuscular Q6H PRN Johnn Hai, MD      . LORazepam (ATIVAN) tablet 1 mg  1 mg Oral Q6H PRN Johnn Hai, MD   1 mg at 09/06/18 2137   Or  . LORazepam (ATIVAN) injection 2 mg  2 mg Intramuscular Q4H PRN Johnn Hai, MD   2 mg at 09/03/18 0044  . LORazepam (ATIVAN) tablet 1 mg  1 mg Oral BID Johnn Hai, MD   1 mg at 09/07/18 8891  . magnesium hydroxide (MILK OF MAGNESIA) suspension 30 mL  30 mL Oral Daily PRN Patrecia Pour, NP   30 mL at 08/31/18 2320  . pantoprazole  (PROTONIX) EC tablet 40 mg  40 mg Oral BID Johnn Hai, MD   40 mg at 09/07/18 6945  . zolpidem (AMBIEN) tablet 5 mg  5 mg Oral QHS [name], [name], MD   5 mg at 09/06/18 2136   PTA Medications: Medications Prior to Admission  Medication Sig Dispense Refill Last Dose  . acetaminophen (TYLENOL) 500 MG tablet Take 500 mg by mouth every 6 (six) hours as needed for mild pain.     . benztropine (COGENTIN) 1 MG tablet Take 1 mg by mouth 2 (two) times daily.     . clopidogrel (PLAVIX) 75 MG tablet Take 1 tablet (75 mg total) by mouth daily. 30 tablet 0   . cloZAPine (CLOZARIL) 100 MG tablet Take 1.5 tablets (150 mg total) by mouth 3 (three) times daily. At 8 AM, 5PM, 8PM per Dr. Darleene Cleaver (Patient taking differently: Take 100-200 mg by mouth 3 (three) times daily. One tablet (100 mg) At 8 AM, and 2PM, two tablets (200 mg) at bedtime per Dr. Darleene Cleaver)     . clozapine (CLOZARIL) 50 MG tablet Take 50 mg by mouth 2 (two) times daily. At 8 am and 2 pm     . diphenhydrAMINE (BENADRYL) 50 MG capsule  Take 50 mg by mouth every 6 (six) hours as needed for allergies (sedation).      Marland Kitchen divalproex (DEPAKOTE) 500 MG DR tablet Take 1 tablet (500 mg total) by mouth 2 (two) times daily. 30 tablet 0   . haloperidol (HALDOL) 5 MG tablet Take 5 mg by mouth daily as needed (severe agitation).      . LORazepam (ATIVAN) 1 MG tablet Take 1 mg by mouth daily as needed (severe agitation).      . Maltodextrin-Xanthan Gum (RESOURCE THICKENUP CLEAR) POWD Take 120 g by mouth as needed. (Patient not taking: Reported on 04/07/2018) 1 Can 1   . traZODone (DESYREL) 100 MG tablet Take 100 mg by mouth at bedtime.       Patient Stressors: Financial difficulties Health problems Medication change or noncompliance  Patient Strengths: Motivation for treatment/growth Special hobby/interest Supportive family/friends  Treatment Modalities: Medication Management, Group therapy, Case management,  1 to 1 session with clinician,  Psychoeducation, Recreational therapy.   Physician Treatment Plan for Primary Diagnosis: Schizoaffective disorder, bipolar type (Austin Gates) Long Term Goal(s): Improvement in symptoms so as ready for discharge Improvement in symptoms so as ready for discharge   Short Term Goals: Ability to identify and develop effective coping behaviors will improve Ability to maintain clinical measurements within normal limits will improve Compliance with prescribed medications will improve Ability to verbalize feelings will improve Ability to disclose and discuss suicidal ideas Ability to demonstrate self-control will improve Ability to identify and develop effective coping behaviors will improve  Medication Management: Evaluate patient's response, side effects, and tolerance of medication regimen.  Therapeutic Interventions: 1 to 1 sessions, Unit Group sessions and Medication administration.  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   Protein-calorie malnutrition, severe  Long Term Goal(s): Improvement in symptoms so as ready for discharge Improvement in symptoms so as ready for discharge   Short Term Goals: Ability to identify and develop effective coping behaviors will improve Ability to maintain clinical measurements within normal limits will improve Compliance with prescribed medications will improve Ability to verbalize feelings will improve Ability to disclose and discuss suicidal ideas Ability to demonstrate self-control will improve Ability to identify and develop effective coping behaviors will improve     Medication Management: Evaluate patient's response, side effects, and tolerance of medication regimen.  Therapeutic Interventions: 1 to 1 sessions, Unit Group sessions and Medication administration.  Evaluation of  Outcomes: Progressing   RN Treatment Plan for Primary Diagnosis: Schizoaffective disorder, bipolar type (Austin Gates) Long Term Goal(s): Knowledge of disease and therapeutic regimen to maintain health will improve  Short Term Goals: Ability to demonstrate self-control, Ability to identify and develop effective coping behaviors will improve and Compliance with prescribed medications will improve  Medication Management: RN will administer medications as ordered by provider, will assess and evaluate patient's response and provide education to patient for prescribed medication. RN will report any adverse and/or side effects to prescribing provider.  Therapeutic Interventions: 1 on 1 counseling sessions, Psychoeducation, Medication administration, Evaluate responses to treatment, Monitor vital signs and CBGs as ordered, Perform/monitor CIWA, COWS, AIMS and Fall Risk screenings as ordered, Perform wound care treatments as ordered.  Evaluation of Outcomes: Progressing   LCSW Treatment Plan for Primary Diagnosis: Schizoaffective disorder, bipolar type ([place]) Long Term Goal(s): Safe transition to appropriate next level of care at discharge, Engage patient in therapeutic group addressing  interpersonal concerns.  Short Term Goals: Engage patient in aftercare planning with referrals and resources  Therapeutic Interventions: Assess for all discharge needs, 1 to 1 time with Social worker, Explore available resources and support systems, Assess for adequacy in community support network, Educate family and significant other(s) on suicide prevention, Complete Psychosocial Assessment, Interpersonal group therapy.  Evaluation of Outcomes: Progressing   Progress in Treatment: Attending groups: Yes. Participating in groups: Yes. Taking medication as prescribed: Yes. Toleration medication: Yes. Family/Significant other contact made: Yes, individual(s) contacted:  [name], legal guardian Patient understands  diagnosis: No. Discussing patient identified problems/goals with staff: No. Medical problems stabilized or resolved: Yes. Denies suicidal/homicidal ideation: Yes. Issues/concerns per patient self-inventory: No. Other: NA  New problem(s) identified: No, Describe:  none reported  New Short Term/Long Term Goal(s): Pt did not attend today's meeting. Nurse Tech reports the pt is sedated and resting in his room.   Patient Goals:  Goal not obtained at this time. Update 09/07/18:  Patient reports goal is to "got to a nursing home".  Update 09/07/18: medication management for mood stabilization; development of comprehensive mental wellness plan.   Discharge Plan or Barriers: Pt will follow up at [facility] in [place] where he is an established patient.  Update 09/07/18:  Patient has an aftercare appointment scheduled with [facility] in [place].  Patient remains on the unit to assist with Care Coordinator and legal guardian in finding placement for the patient as the guardian has declined for the patient to return to his previous group home. Patient is at baseline for care. Patient has tentatively been approved for [facility] and placement was scheduled for 7/20, however, that may not happen.   Reason for Continuation of Hospitalization: Medication stabilization  Estimated Length of Stay: 3-5 days  Recreational Therapy: Patient: N/A Patient Goal: Patient will engage in groups without prompting or encouragement from LRT x3 group sessions within 5 recreation therapy group sessions   Attendees: Patient: 09/07/2018 11:12 AM  Physician: Dr. Weber Cooks 09/07/2018 11:12 AM  Nursing:  09/07/2018 11:12 AM  RN Care Manager: 09/07/2018 11:12 AM  Social Worker:  Assunta Curtis LCSW 09/07/2018 11:12 AM  Recreational Therapist:  09/07/2018 11:12 AM  Other:  09/07/2018 11:12 AM  Other:  09/07/2018 11:12 AM  Other: 09/07/2018 11:12 AM    Scribe for Treatment Team: [name], LCSW 09/07/2018 11:12 AM

## 2018-09-07 NOTE — Plan of Care (Signed)
Patient is continued on 1&1 for safety due to poor gait and walks sluggishly and staggering every were , safety protocol is in place to protect against falls, ADLs are  provided , food and fluids are provided, patient takes his medications regularly without any side effects, denies any SI/HI/AVH no distress.   Problem: Activity: Goal: Will identify at least one activity in which they can participate Outcome: Progressing   Problem: Coping: Goal: Ability to identify and develop effective coping behavior will improve Outcome: Progressing Goal: Ability to interact with others will improve Outcome: Progressing Goal: Demonstration of participation in decision-making regarding own care will improve Outcome: Progressing Goal: Ability to use eye contact when communicating with others will improve Outcome: Progressing   Problem: Health Behavior/Discharge Planning: Goal: Identification of resources available to assist in meeting health care needs will improve Outcome: Progressing   Problem: Self-Concept: Goal: Will verbalize positive feelings about self Outcome: Progressing   Problem: Education: Goal: Utilization of techniques to improve thought processes will improve Outcome: Progressing Goal: Knowledge of the prescribed therapeutic regimen will improve Outcome: Progressing   Problem: Activity: Goal: Interest or engagement in leisure activities will improve Outcome: Progressing Goal: Imbalance in normal sleep/wake cycle will improve Outcome: Progressing   Problem: Coping: Goal: Coping ability will improve Outcome: Progressing Goal: Will verbalize feelings Outcome: Progressing   Problem: Health Behavior/Discharge Planning: Goal: Ability to make decisions will improve Outcome: Progressing Goal: Compliance with therapeutic regimen will improve Outcome: Progressing   Problem: Role Relationship: Goal: Will demonstrate positive changes in social behaviors and relationships Outcome:  Progressing   Problem: Safety: Goal: Ability to disclose and discuss suicidal ideas will improve Outcome: Progressing Goal: Ability to identify and utilize support systems that promote safety will improve Outcome: Progressing   Problem: Self-Concept: Goal: Will verbalize positive feelings about self Outcome: Progressing Goal: Level of anxiety will decrease Outcome: Progressing   Problem: Education: Goal: Ability to verbalize precipitating factors for violent behavior will improve Outcome: Progressing   Problem: Coping: Goal: Ability to verbalize frustrations and anger appropriately will improve Outcome: Progressing   Problem: Health Behavior/Discharge Planning: Goal: Ability to implement measures to prevent violent behavior in the future will improve Outcome: Progressing   Problem: Safety: Goal: Ability to demonstrate self-control will improve Outcome: Progressing Goal: Ability to redirect hostility and anger into socially appropriate behaviors will improve Outcome: Progressing

## 2018-09-07 NOTE — [facility] Counselor (Signed)
CSW has faxed medications, H&P, most recent progress note and results of Covid screening.   CSW received confirmation fax was successful.  Assunta Curtis, MSW, LCSW 09/07/2018 1:53 PM

## 2018-09-08 DIAGNOSIS — F250 Schizoaffective disorder, bipolar type: Secondary | ICD-10-CM

## 2018-09-08 LAB — VALPROIC ACID LEVEL: Valproic Acid Lvl: 29 ug/mL — ABNORMAL LOW (ref 50.0–100.0)

## 2018-09-08 NOTE — Progress Notes (Signed)
1:1 observation note: 1900-0000 D: Patient in dayroom watching TV. Sitter within reach. Denies SI, HI and AVH. Asked for hs medication early so he could go to sleep. At 2100 patient was already in bed asleep and was unable to be aroused to take his medication. Was observed ambulating from wheelchair to bathroom without difficulty. Gait is still unsteady. A: Continue 1:1 for safety R: Safety maintained 0000-0400 D: Patient is resting in bed with eyes closed. Sitter within reach A: Continue 1:1 for safety R: Safety maintained 0400-0700 D: Patient awake at 0600 for vital signs. Asked for assistance with self-inventory sheet. Rated anxiety and depression as 10/10 although patient does not appear anxious or depressed. Unclear if patient understands how to rate anxiety and depression. Denies SI, HI and AVH. Sitter within reach. A: Continue 1:1 for safety. R: Safety maintained
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
## 2018-09-08 NOTE — Plan of Care (Signed)
  Problem: Coping: Goal: Ability to identify and develop effective coping behavior will improve Outcome: Not Progressing Goal: Ability to interact with others will improve Outcome: Not Progressing Goal: Demonstration of participation in decision-making regarding own care will improve Outcome: Not Progressing Goal: Ability to use eye contact when communicating with others will improve Outcome: Not Progressing    D: Patient has been out in the dayroom watching TV. Speech is clearer. Patient is not drooling. Appears less confused. Denies SI, HI and AVH. Ambulating to bathroom with assistance. A:Continue 1:1 for safety R: Safety maintained.

## 2018-09-08 NOTE — [facility] Group Notes (Signed)
LCSW Group Therapy Note   09/08/2018 1:15pm   Type of Therapy and Topic:  Group Therapy:  Trust and Honesty  Participation Level:  Active  Description of Group:    In this group patients will be asked to explore the value of being honest.  Patients will be guided to discuss their thoughts, feelings, and behaviors related to honesty and trusting in others. Patients will process together how trust and honesty relate to forming relationships with peers, family members, and self. Each patient will be challenged to identify and express feelings of being vulnerable. Patients will discuss reasons why people are dishonest and identify alternative outcomes if one was truthful (to self or others). This group will be process-oriented, with patients participating in exploration of their own experiences, giving and receiving support, and processing challenge from other group members.   Therapeutic Goals: 1. Patient will identify why honesty is important to relationships and how honesty overall affects relationships.  2. Patient will identify a situation where they lied or were lied too and the  feelings, thought process, and behaviors surrounding the situation 3. Patient will identify the meaning of being vulnerable, how that feels, and how that correlates to being honest with self and others. 4. Patient will identify situations where they could have told the truth, but instead lied and explain reasons of dishonesty.   Summary of Patient Progress  The patient reported that he feels "good." The patient was able to explore the value of being honest.  Patient discussed thoughts, feelings, and behaviors related to honesty and trusting in others. The patient processed together with other group members how trust and honesty relate to forming relationships with peers, family members, and self. Pt actively and appropriately engaged in the group. Patient was able to provide support and validation to other group members.  Patient practiced active listening when interacting with the facilitator and other group members.    Therapeutic Modalities:   Cognitive Behavioral Therapy Solution Focused Therapy Motivational Interviewing Brief Therapy  Rusti Arizmendi  CUEBAS-COLON, LCSW 09/08/2018 12:35 PM
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
## 2018-09-08 NOTE — Progress Notes (Signed)
1:1 Patient Hourly Rounding  1000: Patient is in the main hall, in his wheelchair, with his assigned safety sitter present at his side.  1400: Patient is in his room with his assigned safety sitter present at his bedside.  1800: Patient is in the dayroom, watching television with staff and the other members on the unit, with his assigned safety sitter present at his side.

## 2018-09-09 DIAGNOSIS — F250 Schizoaffective disorder, bipolar type: Secondary | ICD-10-CM

## 2018-09-09 NOTE — Progress Notes (Signed)
1:1 Patient Hourly Rounding  1000: Patient is outside in the courtyard with staff and the other members on the unit, with his assigned safety sitter present at his side.  1400: Patient is in social work group with his assigned Air cabin crew present at his side.  1800: Patient is in the dayroom, watching television with other members on the unit and his assigned safety sitter present at his side.

## 2018-09-09 NOTE — Progress Notes (Signed)
1:1 observation 1900-2300 D: Patient is needy and attention seeking. Repeatedly asking for snacks. Denies SI, HI and AVH. Speech is clearer and less slurred. In wheelchair. Ambulates from wheelchair to the bathroom. Sitter within reach A: Continue 1:1 for safety. R: Safety maintained. 0000-0400 D: Patient resting in bed with eyes closed. Sitter within reach.  A: Continue 1:1 for safety R: Safety maintained. 0400-0700 D: In wheelchair, with sitter within reach. In dayroom, asking for peanut butter and graham crackers. Denies SI, HI and AVH. A: Continue 1:1 for safety R: Safety maintained

## 2018-09-09 NOTE — Progress Notes (Signed)
Austin Gates Progress Note  09/09/2018 9:34 AM Austin Gates  MRN:  [id number]   Austin Gates is a 58yo M with psych h/o Schizoaffective d/o, IDD, who was admitted to [place] unit 8 days ago for mood disturbance at group home.  Patient seen.  Chart reviewed. Patient discussed with nursing; no overnight events reported.  Subjective: Patient continues to be intrusive and making multiple requests, but he is not aggressive and redirectable.  He reports good mood. He describes his breakfast in details. He is asking about new face masks several times during the day. He says he does not want to go to his old group home as he believes they mistreated him there.  He reports "good" mood. Denies any complaints. Denies any thoughts of harming self or others. He appears to be at his intellectual baseline.  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 Hypertension   . Intellectual disability   . Obsessive-compulsive disorder   . Schizo-affective schizophrenia [facility])     Past Surgical History:  Procedure Laterality Date  . COLONOSCOPY WITH PROPOFOL N/A 12/28/2017   Procedure: COLONOSCOPY WITH PROPOFOL;  Surgeon: Jonathon Bellows, Gates;  Location: [facility] ENDOSCOPY;  Service: Gastroenterology;  Laterality: N/A;  . HYDROCELE EXCISION Bilateral 02/22/2018   Procedure: bilateral HYDROCELECTOMY ADULT;  Surgeon: Cleon Gustin, Gates;  Location: [facility];  Service: Urology;  Laterality: Bilateral;  . INSERTION OF ILIAC STENT Left 02/22/2018   Procedure: INSERTION LEFT SUPERIOR FEMORAL ARTERY USING 6MM X 5CM VIABHON STENT with mynx device closure on right femoral artery;  Surgeon: Waynetta Sandy, Gates;  Location: [place];  Service: Vascular;  Laterality: Left;  . KNEE ARTHROSCOPY    . SCROTAL EXPLORATION N/A 02/22/2018   Procedure: SCROTUM EXPLORATION;  Surgeon: Cleon Gustin, Gates;  Location: [place];  Service: Urology;  Laterality: N/A;  . UPPER EXTREMITY ANGIOGRAM Left 02/22/2018   Procedure: left lower EXTREMITY ANGIOGRAM;  Surgeon: Waynetta Sandy, Gates;  Location: [facility] OR;  Service: Vascular;  Laterality: Left;   Family History:  Family History  Problem Relation Age of Onset  . Diabetes Mother    Family Psychiatric  History: see H&P Social History:  Social History   Substance and Sexual Activity  Alcohol Use No     Social History   Substance and Sexual Activity  Drug Use No    Social History   Socioeconomic History  . Marital status: Single    Spouse name: Not on file  . Number of children: Not on file  . Years of education: Not on file  . Highest education level: Not on file  Occupational History  . Occupation: Disabled  Social Needs  . Financial resource strain: Not on file  . Food insecurity    Worry: Not on file    Inability: Not on file  . Transportation needs    Medical: Not on file    Non-medical: Not on file  Tobacco Use  . Smoking status: Former Smoker    Types: Cigarettes  . Smokeless tobacco: Never Used  Substance and Sexual Activity  . Alcohol use: No  . Drug use: No  . Sexual activity: Never  Lifestyle  . Physical activity  Days per week: Not on file    Minutes per session: Not on file  . Stress: Not on file  Relationships  . Social Herbalist on phone: Not on file    Gets together: Not on file    Attends religious service: Not on file    Active member of club or organization: Not on file    Attends meetings of clubs or organizations: Not on file    Relationship status: Not on file  Other Topics Concern  . Not on file  Social History Narrative   ** Merged History Encounter **       Additional  Social History:      Sleep: Good  Appetite:  Good  Current Medications: Current Facility-Administered Medications  Medication Dose Route Frequency Provider Last Rate Last Dose  . acetaminophen (TYLENOL) tablet 650 mg  650 mg Oral Q6H PRN Patrecia Pour, NP      . alum & mag hydroxide-simeth (MAALOX/MYLANTA) 200-200-20 MG/5ML suspension 30 mL  30 mL Oral Q4H PRN Patrecia Pour, NP   30 mL at 09/08/18 1224  . clopidogrel (PLAVIX) tablet 75 mg  75 mg Oral Daily Patrecia Pour, NP   75 mg at 09/09/18 0830  . cloZAPine (CLOZARIL) tablet 300 mg  300 mg Oral QHS Johnn Hai, Gates   300 mg at 09/08/18 2104  . divalproex (DEPAKOTE) DR tablet 500 mg  500 mg Oral BID Patrecia Pour, NP   500 mg at 09/09/18 0830  . feeding supplement (ENSURE ENLIVE) (ENSURE ENLIVE) liquid 237 mL  237 mL Oral BID BM Money, [name], [name]   237 mL at 09/08/18 1400  . gabapentin (NEURONTIN) capsule 300 mg  300 mg Oral TID Johnn Hai, Gates   300 mg at 09/09/18 0830  . haloperidol (HALDOL) tablet 5 mg  5 mg Oral Q6H PRN Johnn Hai, Gates   5 mg at 09/04/18 2149   Or  . haloperidol lactate (HALDOL) injection 10 mg  10 mg Intramuscular Q6H PRN Johnn Hai, Gates      . LORazepam (ATIVAN) tablet 1 mg  1 mg Oral Q6H PRN Johnn Hai, Gates   1 mg at 09/06/18 2137   Or  . LORazepam (ATIVAN) injection 2 mg  2 mg Intramuscular Q4H PRN Johnn Hai, Gates   2 mg at 09/03/18 0044  . LORazepam (ATIVAN) tablet 1 mg  1 mg Oral BID Johnn Hai, Gates   1 mg at 09/09/18 0830  . magnesium hydroxide (MILK OF MAGNESIA) suspension 30 mL  30 mL Oral Daily PRN Patrecia Pour, NP   30 mL at 08/31/18 2320  . pantoprazole (PROTONIX) EC tablet 40 mg  40 mg Oral BID Johnn Hai, Gates   40 mg at 09/09/18 0830  . zolpidem (AMBIEN) tablet 5 mg  5 mg Oral QHS [name], [name], Gates   5 mg at 09/08/18 2104    Lab Results:  Results for orders placed or performed during the hospital encounter of 08/31/18 (from the past 48 hour(s))  CBC with Differential/Platelet      Status: Abnormal   Collection Time: 09/07/18  9:49 AM  Result Value Ref Range   WBC 6.7 4.0 - 10.5 K/uL   RBC 3.99 (L) 4.22 - 5.81 MIL/uL   Hemoglobin 11.8 (L) 13.0 - 17.0 g/dL   HCT 38.1 (L) 39.0 - 52.0 %   MCV 95.5 80.0 - 100.0 fL   MCH 29.6 26.0 - 34.0 pg  MCHC 31.0 30.0 - 36.0 g/dL   RDW 14.4 11.5 - 15.5 %   Platelets 209 150 - 400 K/uL   nRBC 0.4 (H) 0.0 - 0.2 %   Neutrophils Relative % 70 %   Neutro Abs 4.6 1.7 - 7.7 K/uL   Lymphocytes Relative 16 %   Lymphs Abs 1.1 0.7 - 4.0 K/uL   Monocytes Relative 9 %   Monocytes Absolute 0.6 0.1 - 1.0 K/uL   Eosinophils Relative 2 %   Eosinophils Absolute 0.1 0.0 - 0.5 K/uL   Basophils Relative 0 %   Basophils Absolute 0.0 0.0 - 0.1 K/uL   Immature Granulocytes 3 %   Abs Immature Granulocytes 0.22 (H) 0.00 - 0.07 K/uL    Comment: Performed at [facility], [address] [place], [place] [address]  Valproic acid level     Status: Abnormal   Collection Time: 09/08/18  7:15 AM  Result Value Ref Range   Valproic Acid Lvl 29 (L) 50.0 - 100.0 ug/mL    Comment: Performed at [facility], [address] [place], [place] [address]    Blood Alcohol level:  Lab Results  Component Value Date   [facility] <10 08/31/2018   ETH <10 46/56/8127    Metabolic Disorder Labs: Lab Results  Component Value Date   HGBA1C 5.4 09/30/2016   MPG 108.28 09/30/2016   MPG 114 09/17/2016   No results found for: PROLACTIN Lab Results  Component Value Date   CHOL 135 09/30/2016   TRIG 104 02/25/2018   HDL 60 09/30/2016   CHOLHDL 2.3 09/30/2016   VLDL 12 09/30/2016   LDLCALC 63 09/30/2016    Physical Findings: AIMS: Facial and Oral Movements Muscles of Facial Expression: None, normal Lips and Perioral Area: None, normal Jaw: None, normal Tongue: None, normal,Extremity Movements Upper (arms, wrists, hands, fingers): None, normal Lower (legs, knees, ankles, toes): None, normal, Trunk Movements Neck, shoulders, hips: None,  normal, Overall Severity Severity of abnormal movements (highest score from questions above): None, normal Incapacitation due to abnormal movements: None, normal Patient's awareness of abnormal movements (rate only patient's report): No Awareness, Dental Status Current problems with teeth and/or dentures?: No Does patient usually wear dentures?: No  CIWA:  CIWA-Ar Total: 2 COWS:  COWS Total Score: 4  Musculoskeletal: Strength & Muscle Tone: within normal limits Gait & Station: wheel chair Patient leans: N/A  Psychiatric Specialty Exam: Physical Exam   ROS   Blood pressure 112/84, pulse 100, temperature 97.8 F (36.6 C), temperature source Oral, resp. rate 18, height 5\' 11"  (1.803 m), weight 67.1 kg, SpO2 99 %.Body mass index is 20.64 kg/m.  General Appearance: Casual  Eye Contact:  Good  Speech:  Garbled  Volume:  Normal  Mood:  Euthymic  Affect:  Appropriate and Constricted  Thought Process:  Coherent  Orientation:  Other:  self, place  Thought Content:  Illogical  Suicidal Thoughts:  No  Homicidal Thoughts:  No  Memory:  Immediate;   Fair Recent;   Poor Remote;   Poor  Judgement:  Fair  Insight:  Shallow  Psychomotor Activity:  Normal  Concentration:  Concentration: Poor and Attention Span: Poor  Recall:  Poor  Fund of Knowledge:  Poor  Language:  Fair  Akathisia:  No  Handed:  Ambidextrous  AIMS (if indicated):     Assets:  Desire for Improvement Physical Health  ADL's:  Intact  Cognition:  Impaired,  Moderate  Sleep:  Number of Hours: 7.5  Treatment Plan Summary: Daily contact with patient to assess and evaluate symptoms and progress in treatment   58yo M with psych h/o Schizoaffective d/o, IDD, who was admitted to [facility] unit a week ago for mood disturbance at group home. Today patient still intrusive and redirectable, which appears his baseline considering his IDD. He does not appear to be acutely psychotic or at risk of harming self or others. His VPA  level was low at 29, but taking his appropriate behavior I again will continue all psych medications without changes.  Impression: IDD, moderate. Schizoaffective disorder.   Plan: -continue inpatient psych admission; 15-minute checks; daily contact with patient to assess and evaluate symptoms and progress in treatment; psychoeducation.   -continue scheduled psych medications: Clozapine 300mg  PO QHS for psychosis; Depakote 500mg  PO BID for mood stabilization; VPA level 29 on 7/18. Ambien 5mg  PO QHS for sleep; Gababentin 300mg  PO TID for anxiety, pain.   -continue PRN medications.   -Disposition: group home early next week.   Larita Fife, Gates 09/09/2018, 9:34 AM

## 2018-09-09 NOTE — Plan of Care (Signed)
  Problem: Coping: Goal: Ability to identify and develop effective coping behavior will improve Outcome: Not Progressing Goal: Ability to interact with others will improve Outcome: Not Progressing Goal: Demonstration of participation in decision-making regarding own care will improve Outcome: Not Progressing Goal: Ability to use eye contact when communicating with others will improve Outcome: Not Progressing  D: Patient has been more talkative. Attention seeking. Asking continuously for snacks. Redirectable. Denies SI, HI and AVH. A: Continue 1:1 for safety. R: Safety maintained.

## 2018-09-09 NOTE — Plan of Care (Signed)
D- Patient alert and oriented. Patient presents in a pleasant, but preoccupied mood on assessment stating to this writer that he would like it if I come and sit with him during lunch time. Patient denies SI, HI, AVH, and pain at this time. Patient also denies any signs/symptoms of depression/anxiety. Patient had no stated goals for today, however, he stated that he wanted to go outside again.  A- Scheduled medications administered to patient, per MD orders. Support and encouragement provided.  Routine safety checks conducted every 15 minutes.  Patient informed to notify staff with problems or concerns.  R- No adverse drug reactions noted. Patient contracts for safety at this time. Patient compliant with medications and treatment plan. Patient receptive, calm, and cooperative. Patient interacts well with others on the unit.  Patient remains safe at this time.  Problem: Activity: Goal: Will identify at least one activity in which they can participate Outcome: Progressing   Problem: Coping: Goal: Ability to identify and develop effective coping behavior will improve Outcome: Progressing Goal: Ability to interact with others will improve Outcome: Progressing Goal: Demonstration of participation in decision-making regarding own care will improve Outcome: Progressing Goal: Ability to use eye contact when communicating with others will improve Outcome: Progressing   Problem: Health Behavior/Discharge Planning: Goal: Identification of resources available to assist in meeting health care needs will improve Outcome: Progressing   Problem: Self-Concept: Goal: Will verbalize positive feelings about self Outcome: Progressing   Problem: Education: Goal: Utilization of techniques to improve thought processes will improve Outcome: Progressing Goal: Knowledge of the prescribed therapeutic regimen will improve Outcome: Progressing   Problem: Activity: Goal: Interest or engagement in leisure  activities will improve Outcome: Progressing Goal: Imbalance in normal sleep/wake cycle will improve Outcome: Progressing   Problem: Coping: Goal: Coping ability will improve Outcome: Progressing Goal: Will verbalize feelings Outcome: Progressing   Problem: Health Behavior/Discharge Planning: Goal: Ability to make decisions will improve Outcome: Progressing Goal: Compliance with therapeutic regimen will improve Outcome: Progressing   Problem: Role Relationship: Goal: Will demonstrate positive changes in social behaviors and relationships Outcome: Progressing   Problem: Safety: Goal: Ability to disclose and discuss suicidal ideas will improve Outcome: Progressing Goal: Ability to identify and utilize support systems that promote safety will improve Outcome: Progressing   Problem: Self-Concept: Goal: Will verbalize positive feelings about self Outcome: Progressing Goal: Level of anxiety will decrease Outcome: Progressing   Problem: Education: Goal: Ability to verbalize precipitating factors for violent behavior will improve Outcome: Progressing   Problem: Coping: Goal: Ability to verbalize frustrations and anger appropriately will improve Outcome: Progressing   Problem: Health Behavior/Discharge Planning: Goal: Ability to implement measures to prevent violent behavior in the future will improve Outcome: Progressing   Problem: Safety: Goal: Ability to demonstrate self-control will improve Outcome: Progressing Goal: Ability to redirect hostility and anger into socially appropriate behaviors will improve Outcome: Progressing

## 2018-09-09 NOTE — [facility] Group Notes (Signed)
LCSW Group Therapy Note 09/09/2018 1:15pm  Type of Therapy and Topic: Group Therapy: Feelings Around Returning Home & Establishing a Supportive Framework and Supporting Oneself When Supports Not Available  Participation Level: Active  Description of Group:  Patients first processed thoughts and feelings about upcoming discharge. These included fears of upcoming changes, lack of change, new living environments, judgements and expectations from others and overall stigma of mental health issues. The group then discussed the definition of a supportive framework, what that looks and feels like, and how do to discern it from an unhealthy non-supportive network. The group identified different types of supports as well as what to do when your family/friends are less than helpful or unavailable  Therapeutic Goals  1. Patient will identify one healthy supportive network that they can use at discharge. 2. Patient will identify one factor of a supportive framework and how to tell it from an unhealthy network. 3. Patient able to identify one coping skill to use when they do not have positive supports from others. 4. Patient will demonstrate ability to communicate their needs through discussion and/or role plays.  Summary of Patient Progress:  The patient reported he feels "okay." Pt engaged during group session. As patients processed their anxiety about discharge and described healthy supports patient shared he is ready to be discharge but he doesn't want to go back to his group home.  Patients identified at least one self-care tool they were willing to use after discharge.   Therapeutic Modalities Cognitive Behavioral Therapy Motivational Interviewing   Ellar Hakala  CUEBAS-COLON, LCSW 09/09/2018 9:40 AM

## 2018-09-10 NOTE — Progress Notes (Signed)
[facility] MD Progress Note  09/10/2018 4:24 PM Austin Gates  MRN:  [id number]   Subjective: Patient seen and chart reviewed.  Patient has no new complaints.  He has been awake all day.  He is able to maintain appropriate behaviors.  No suicidal/homicidal ideations noted.  Continues to have a 1:1 for fall risks.  Psychiatrically stable, social work is currently seeking placement  Principal Problem: Schizoaffective disorder, bipolar type ([place]) Diagnosis: Principal Problem:   Schizoaffective disorder, bipolar type ([place]) Active Problems:   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   Protein-calorie malnutrition, severe  Total Time spent with patient: 20 minutes  Past Psychiatric History: Lifelong developmental disability chronic behavior problems recent decline in functioning.  Past Medical History:  Past Medical History:  Diagnosis Date  . Hypertension   . Intellectual disability   . Obsessive-compulsive disorder   . Schizo-affective schizophrenia [facility])     Past Surgical History:  Procedure Laterality Date  . COLONOSCOPY WITH PROPOFOL N/A 12/28/2017   Procedure: COLONOSCOPY WITH PROPOFOL;  Surgeon: Jonathon Bellows, MD;  Location: [facility] ENDOSCOPY;  Service: Gastroenterology;  Laterality: N/A;  . HYDROCELE EXCISION Bilateral 02/22/2018   Procedure: bilateral HYDROCELECTOMY ADULT;  Surgeon: Cleon Gustin, MD;  Location: [place];  Service: Urology;  Laterality: Bilateral;  . INSERTION OF ILIAC STENT Left 02/22/2018   Procedure: INSERTION LEFT SUPERIOR FEMORAL ARTERY USING 6MM X 5CM VIABHON STENT with mynx device closure on right femoral artery;  Surgeon: Waynetta Sandy, MD;  Location: [place];  Service: Vascular;  Laterality: Left;  . KNEE ARTHROSCOPY    . SCROTAL EXPLORATION N/A 02/22/2018   Procedure: SCROTUM EXPLORATION;  Surgeon: Cleon Gustin, MD;  Location: [place];  Service: Urology;  Laterality: N/A;  . UPPER EXTREMITY ANGIOGRAM Left 02/22/2018   Procedure: left lower EXTREMITY  ANGIOGRAM;  Surgeon: Waynetta Sandy, MD;  Location: [facility] OR;  Service: Vascular;  Laterality: Left;   Family History:  Family History  Problem Relation Age of Onset  . Diabetes Mother    Family Psychiatric  History: Unknown Social History:  Social History   Substance and Sexual Activity  Alcohol Use No     Social History   Substance and Sexual Activity  Drug Use No    Social History   Socioeconomic History  . Marital status: Single    Spouse name: Not on file  . Number of children: Not on file  . Years of education: Not on file  . Highest education level: Not on file  Occupational History  . Occupation: Disabled  Social Needs  . Financial resource strain: Not on file  . Food insecurity    Worry: Not on file    Inability: Not on file  . Transportation needs    Medical: Not on file    Non-medical: Not on file  Tobacco Use  . Smoking status: Former Smoker    Types: Cigarettes  . Smokeless tobacco: Never Used  Substance and Sexual Activity  . Alcohol use: No  . Drug use: No  . Sexual activity: Never  Lifestyle  . Physical activity    Days per week: Not on file    Minutes per session: Not on file  . Stress: Not on file  Relationships  . Social Herbalist on phone: Not on file    Gets together: Not on file    Attends religious service: Not on file    Active member of club or organization: Not on file  Attends meetings of clubs or organizations: Not on file    Relationship status: Not on file  Other Topics Concern  . Not on file  Social History Narrative   ** Merged History Encounter **       Additional Social History:                         Sleep: Fair  Appetite:  Good  Current Medications: Current Facility-Administered Medications  Medication Dose Route Frequency Provider Last Rate Last Dose  . acetaminophen (TYLENOL) tablet 650 mg  650 mg Oral Q6H PRN Patrecia Pour, NP   650 mg at 09/09/18 1253  . alum & mag  hydroxide-simeth (MAALOX/MYLANTA) 200-200-20 MG/5ML suspension 30 mL  30 mL Oral Q4H PRN Patrecia Pour, NP   30 mL at 09/09/18 1124  . clopidogrel (PLAVIX) tablet 75 mg  75 mg Oral Daily Patrecia Pour, NP   75 mg at 09/10/18 1013  . cloZAPine (CLOZARIL) tablet 300 mg  300 mg Oral QHS Johnn Hai, MD   300 mg at 09/09/18 2111  . divalproex (DEPAKOTE) DR tablet 500 mg  500 mg Oral BID Patrecia Pour, NP   500 mg at 09/10/18 1014  . feeding supplement (ENSURE ENLIVE) (ENSURE ENLIVE) liquid 237 mL  237 mL Oral BID BM Money, [name], [name]   237 mL at 09/10/18 1014  . gabapentin (NEURONTIN) capsule 300 mg  300 mg Oral TID Johnn Hai, MD   300 mg at 09/10/18 1211  . haloperidol (HALDOL) tablet 5 mg  5 mg Oral Q6H PRN Johnn Hai, MD   5 mg at 09/04/18 2149   Or  . haloperidol lactate (HALDOL) injection 10 mg  10 mg Intramuscular Q6H PRN Johnn Hai, MD      . LORazepam (ATIVAN) tablet 1 mg  1 mg Oral Q6H PRN Johnn Hai, MD   1 mg at 09/06/18 2137   Or  . LORazepam (ATIVAN) injection 2 mg  2 mg Intramuscular Q4H PRN Johnn Hai, MD   2 mg at 09/03/18 0044  . LORazepam (ATIVAN) tablet 1 mg  1 mg Oral BID Johnn Hai, MD   1 mg at 09/10/18 1014  . magnesium hydroxide (MILK OF MAGNESIA) suspension 30 mL  30 mL Oral Daily PRN Patrecia Pour, NP   30 mL at 08/31/18 2320  . pantoprazole (PROTONIX) EC tablet 40 mg  40 mg Oral BID Johnn Hai, MD   40 mg at 09/10/18 1014  . zolpidem (AMBIEN) tablet 5 mg  5 mg Oral QHS [name], [name], MD   5 mg at 09/09/18 2112    Lab Results: No results found for this or any previous visit (from the past 48 hour(s)).  Blood Alcohol level:  Lab Results  Component Value Date   ETH <10 08/31/2018   ETH <10 82/95/6213    Metabolic Disorder Labs: Lab Results  Component Value Date   HGBA1C 5.4 09/30/2016   MPG 108.28 09/30/2016   MPG 114 09/17/2016   No results found for: PROLACTIN Lab Results  Component Value Date   CHOL 135 09/30/2016   TRIG 104  02/25/2018   HDL 60 09/30/2016   CHOLHDL 2.3 09/30/2016   VLDL 12 09/30/2016   LDLCALC 63 09/30/2016    Physical Findings: AIMS: Facial and Oral Movements Muscles of Facial Expression: None, normal Lips and Perioral Area: None, normal Jaw: None, normal Tongue: None, normal,Extremity Movements Upper (arms, wrists, hands,  fingers): None, normal Lower (legs, knees, ankles, toes): None, normal, Trunk Movements Neck, shoulders, hips: None, normal, Overall Severity Severity of abnormal movements (highest score from questions above): None, normal Incapacitation due to abnormal movements: None, normal Patient's awareness of abnormal movements (rate only patient's report): No Awareness, Dental Status Current problems with teeth and/or dentures?: No Does patient usually wear dentures?: No  CIWA:  CIWA-Ar Total: 2 COWS:  COWS Total Score: 4  Musculoskeletal: Strength & Muscle Tone: decreased Gait & Station: unsteady Patient leans: N/A  Psychiatric Specialty Exam: Physical Exam  Nursing note and vitals reviewed. Constitutional: He appears well-developed and well-nourished.  HENT:  Head: Normocephalic and atraumatic.  Eyes: Pupils are equal, round, and reactive to light. Conjunctivae are normal.  Neck: Normal range of motion.  Cardiovascular: Regular rhythm and normal heart sounds.  Respiratory: Effort normal.  GI: Soft.  Musculoskeletal: Normal range of motion.  Neurological: He is alert.  Skin: Skin is warm and dry.  Psychiatric: His speech is normal and behavior is normal. Thought content normal. His mood appears anxious. He is not aggressive. Thought content is not paranoid. Cognition and memory are impaired. He expresses impulsivity. He expresses no homicidal and no suicidal ideation.    Review of Systems  Constitutional: Negative.   HENT: Negative.   Eyes: Negative.   Respiratory: Negative.   Cardiovascular: Negative.   Gastrointestinal: Negative.   Musculoskeletal:  Negative.   Skin: Negative.   Neurological: Negative.   Psychiatric/Behavioral: The patient is nervous/anxious.     Blood pressure 115/75, pulse (!) 113, temperature 97.9 F (36.6 C), temperature source Oral, resp. rate 18, height 5\' 11"  (1.803 m), weight 67.1 kg, SpO2 95 %.Body mass index is 20.64 kg/m.  General Appearance: Casual  Eye Contact:  Fair  Speech:  Garbled and Slurred  Volume:  Decreased  Mood:  Euthymic  Affect:  Constricted  Thought Process:  Disorganized at times, baseline  Orientation:  Self  Thought Content:  Logical at times  Suicidal Thoughts:  No  Homicidal Thoughts:  No  Memory:  Immediate;   Fair Recent;   Poor Remote;   Poor  Judgement:  Impaired  Insight:  Shallow  Psychomotor Activity:  Decreased  Concentration:  Fair  Recall:  Poor  Fund of Knowledge:  Poor  Language:  Poor  Akathisia:  No  Handed:  Right  AIMS (if indicated):     Assets:  Desire for Improvement Social Support  ADL's:  Impaired  Cognition:  Impaired,  Moderate  Sleep:  Number of Hours: 8    Treatment Plan Summary: Plan:  Review of chart, vital signs, medications, and notes. 1-Admit for crisis management and stabilization.  Seeking placement 2-Individual and group therapy encouraged 3-Medication management for  Schizoaffective disorder, bipolar type:   -Continued Depakote 500 mg BID -Continued Clozaril 300 mg at bedtime -PRN oral and IM agitation medications  Anxiety:   -Continued gabapentin 300 mg TID Insomnia: -Continued Ambien 5 mg at bedtime 4-Coping skills for mental illness 5-Continue crisis stabilization and management 6-Address health issues--monitoring blood pressures and vital signs 7-Treatment plan in progress to prevent relapse of depression and anxiety 8-Psychosocial education regarding relapse prevention and self-care 8-Health care follow up as needed for any medical concerns 9-Call for consult with hospitalist for additional specialty patient services  as needed.  Waylan Boga, NP 09/10/2018, 4:24 PM

## 2018-09-10 NOTE — Progress Notes (Signed)
1:1 Patient Hourly Rounding    1000: Patient is in recreational therapy with the other members on the unit and his assigned safety sitter present at his side.  1400: Patient is in the dayroom talking with staff and other members on the unit, with his assigned safety sitter present by his side.  1800: Patient is asleep in his room, with his assigned safety sitter present at bedside.

## 2018-09-10 NOTE — [facility] Counselor (Signed)
CSW followed up with Care Coordinator, Zipporah Plants [phone].  CSW followed up on referral for [facility].  CSW was informed that the patient was declined by [facility] and that he is currently looking for additional housing resources.    Care Coordinator acknowledged that the guardian is declining to have the patient returning to group home and that is a barrier at this time.   Assunta Curtis, MSW, LCSW 09/10/2018 10:23 AM

## 2018-09-10 NOTE — Plan of Care (Signed)
D- Patient alert and oriented. Patient presented in a pleasant mood on assessment stating that he slept ok last night. Patient denied any signs/symptoms of depression/anxiety, he's just been preoccupied with when he takes his medication and wanting to go outside. Patient also denied SI, HI, AVH, and pain to this Probation officer. Patient had no stated goals for today.  A- Scheduled medications administered to patient, per MD orders. Support and encouragement provided.  Routine safety checks conducted every 15 minutes.  Patient informed to notify staff with problems or concerns.  R- No adverse drug reactions noted. Patient contracts for safety at this time. Patient compliant with medications and treatment plan. Patient receptive, calm, and cooperative. Patient interacts well with others on the unit.  Patient remains safe at this time.  Problem: Activity: Goal: Will identify at least one activity in which they can participate Outcome: Progressing   Problem: Coping: Goal: Ability to identify and develop effective coping behavior will improve Outcome: Progressing Goal: Ability to interact with others will improve Outcome: Progressing Goal: Demonstration of participation in decision-making regarding own care will improve Outcome: Progressing Goal: Ability to use eye contact when communicating with others will improve Outcome: Progressing   Problem: Health Behavior/Discharge Planning: Goal: Identification of resources available to assist in meeting health care needs will improve Outcome: Progressing   Problem: Self-Concept: Goal: Will verbalize positive feelings about self Outcome: Progressing   Problem: Education: Goal: Utilization of techniques to improve thought processes will improve Outcome: Progressing Goal: Knowledge of the prescribed therapeutic regimen will improve Outcome: Progressing   Problem: Activity: Goal: Interest or engagement in leisure activities will improve Outcome:  Progressing Goal: Imbalance in normal sleep/wake cycle will improve Outcome: Progressing   Problem: Coping: Goal: Coping ability will improve Outcome: Progressing Goal: Will verbalize feelings Outcome: Progressing   Problem: Health Behavior/Discharge Planning: Goal: Ability to make decisions will improve Outcome: Progressing Goal: Compliance with therapeutic regimen will improve Outcome: Progressing   Problem: Role Relationship: Goal: Will demonstrate positive changes in social behaviors and relationships Outcome: Progressing   Problem: Safety: Goal: Ability to disclose and discuss suicidal ideas will improve Outcome: Progressing Goal: Ability to identify and utilize support systems that promote safety will improve Outcome: Progressing   Problem: Self-Concept: Goal: Will verbalize positive feelings about self Outcome: Progressing Goal: Level of anxiety will decrease Outcome: Progressing   Problem: Education: Goal: Ability to verbalize precipitating factors for violent behavior will improve Outcome: Progressing   Problem: Coping: Goal: Ability to verbalize frustrations and anger appropriately will improve Outcome: Progressing   Problem: Health Behavior/Discharge Planning: Goal: Ability to implement measures to prevent violent behavior in the future will improve Outcome: Progressing   Problem: Safety: Goal: Ability to demonstrate self-control will improve Outcome: Progressing Goal: Ability to redirect hostility and anger into socially appropriate behaviors will improve Outcome: Progressing

## 2018-09-10 NOTE — Progress Notes (Addendum)
Recreation Therapy Notes  Date: 09/10/2018  Time: 9:30 am  Location: Craft room  Behavioral response: Appropriate   Intervention Topic: Time Management  Discussion/Intervention:  Group content today was focused on time management. The group defined time management and identified healthy ways to manage time. Individuals expressed how much of the 24 hours they use in a day. Patients expressed how much time they use just for themselves personally. The group expressed how they have managed their time in the past. Individuals participated in the intervention "Managing Life" where they had a chance to see how much of the 24 hours they use and where it goes. Clinical Observations/Feedback:  Patient came to group and stated he watches television with most of his time.  Individual was social with peers and staff while participating in the intervention.  Neythan Kozlov LRT/CTRS         TRUE Shackleford 09/10/2018 11:32 AM

## 2018-09-10 NOTE — [facility] Group Notes (Signed)
LCSW Group Therapy Note   09/10/2018 12:37 PM   Type of Therapy and Topic:  Group Therapy:  Overcoming Obstacles   Participation Level:  Minimal   Description of Group:    In this group patients will be encouraged to explore what they see as obstacles to their own wellness and recovery. They will be guided to discuss their thoughts, feelings, and behaviors related to these obstacles. The group will process together ways to cope with barriers, with attention given to specific choices patients can make. Each patient will be challenged to identify changes they are motivated to make in order to overcome their obstacles. This group will be process-oriented, with patients participating in exploration of their own experiences as well as giving and receiving support and challenge from other group members.   Therapeutic Goals: 1. Patient will identify personal and current obstacles as they relate to admission. 2. Patient will identify barriers that currently interfere with their wellness or overcoming obstacles.  3. Patient will identify feelings, thought process and behaviors related to these barriers. 4. Patient will identify two changes they are willing to make to overcome these obstacles:      Summary of Patient Progress Pt was present in group and participated to the best of his ability. Pt reported that he currently has a social obstacle and that he wants to get a job and make some money. When asked feelings associated with obstacles he is facing, pt reported "schizophrenia". Pt reported he is going to work towards his goals and take one step at a time.      Therapeutic Modalities:   Cognitive Behavioral Therapy Solution Focused Therapy Motivational Interviewing Relapse Prevention Therapy  [name], MSW, LCSW Clinical Social Work 09/10/2018 12:37 PM

## 2018-09-10 NOTE — Progress Notes (Signed)
1:1 observation 1900-2300 D: Patient has been calm and cooperative. Denies SI, HI and AVH. Sitter within reach. Ambulating to bathroom. A: Continue 1:1 for safety R: Safety maintained. 0000-0400 D: Resting in be with eyes closed. Sitter within reach. A: Continue 1:1 for safety R: Safety maintained. 0400-0700 D: Sitter within reach. Continues to be child-like and a little bit less attention seeking. A: Continue 1:1 for safety R: Safety maintained

## 2018-09-11 NOTE — Progress Notes (Signed)
Patient is up and on a w/c with a sitter no falls , stable and without any incidents

## 2018-09-11 NOTE — Progress Notes (Signed)
[facility] MD Progress Note  09/11/2018 4:23 PM Austin Gates  MRN:  [id number]   Subjective: Patient reports that he is doing good, but does not want to go back to the group home. He states he has been sleeping good and has a good appetite. He continues stating that he doesn't want to go back to the group home.He denies any suicidal or homicidal ideations and denies any hallucinations.   Objective: Patient's chart and findings reviewed and discussed with treatment team. Patient has no new complaints.  He has been awake all day.  He is able to maintain appropriate behaviors.  No suicidal/homicidal ideations noted.  Continues to have a 1:1 for fall risks.  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  Past Medical History:  Past Medical History:  Diagnosis Date  . Hypertension   . Intellectual disability   . Obsessive-compulsive disorder   . Schizo-affective schizophrenia [facility])     Past Surgical History:  Procedure Laterality Date  . COLONOSCOPY WITH PROPOFOL N/A 12/28/2017   Procedure: COLONOSCOPY WITH PROPOFOL;  Surgeon: Jonathon Bellows, MD;  Location[facility] ENDOSCOPY;  Service: Gastroenterology;  Laterality: N/A;  . HYDROCELE EXCISION Bilateral 02/22/2018   Procedure: bilateral HYDROCELECTOMY ADULT;  Surgeon: Cleon Gustin, MD;  Location: [place];  Service: Urology;  Laterality: Bilateral;  . INSERTION OF ILIAC STENT Left 02/22/2018   Procedure: INSERTION LEFT SUPERIOR FEMORAL ARTERY USING 6MM X 5CM VIABHON STENT with mynx device closure on right femoral artery;  Surgeon:  Waynetta Sandy, MD;  Location: [place];  Service: Vascular;  Laterality: Left;  . KNEE ARTHROSCOPY    . SCROTAL EXPLORATION N/A 02/22/2018   Procedure: SCROTUM EXPLORATION;  Surgeon: Cleon Gustin, MD;  Location: [place];  Service: Urology;  Laterality: N/A;  . UPPER EXTREMITY ANGIOGRAM Left 02/22/2018   Procedure: left lower EXTREMITY ANGIOGRAM;  Surgeon: Waynetta Sandy, MD;  Location: [facility] OR;  Service: Vascular;  Laterality: Left;   Family History:  Family History  Problem Relation Age of Onset  . Diabetes Mother    Family Psychiatric  History: Unknown Social History:  Social History   Substance and Sexual Activity  Alcohol Use No     Social History   Substance and Sexual Activity  Drug Use No    Social History   Socioeconomic History  . Marital status: Single    Spouse name: Not on file  . Number of children: Not on file  . Years of education: Not on file  . Highest education level: Not on file  Occupational History  . Occupation: Disabled  Social Needs  . Financial resource strain: Not on file  . Food insecurity    Worry: Not on file    Inability: Not on file  . Transportation needs    Medical: Not on file    Non-medical: Not on file  Tobacco Use  . Smoking status: Former Smoker    Types: Cigarettes  . Smokeless tobacco: Never Used  Substance and Sexual Activity  . Alcohol use: No  . Drug use: No  . Sexual activity: Never  Lifestyle  . Physical activity    Days per week: Not on file  Minutes per session: Not on file  . Stress: Not on file  Relationships  . Social Herbalist on phone: Not on file    Gets together: Not on file    Attends religious service: Not on file    Active member of club or organization: Not on file    Attends meetings of clubs or organizations: Not on file    Relationship status: Not on file  Other Topics Concern  . Not on file  Social History Narrative   ** Merged History Encounter **        Additional Social History:                         Sleep: Fair  Appetite:  Good  Current Medications: Current Facility-Administered Medications  Medication Dose Route Frequency Provider Last Rate Last Dose  . acetaminophen (TYLENOL) tablet 650 mg  650 mg Oral Q6H PRN Patrecia Pour, NP   650 mg at 09/09/18 1253  . alum & mag hydroxide-simeth (MAALOX/MYLANTA) 200-200-20 MG/5ML suspension 30 mL  30 mL Oral Q4H PRN Patrecia Pour, NP   30 mL at 09/09/18 1124  . clopidogrel (PLAVIX) tablet 75 mg  75 mg Oral Daily Patrecia Pour, NP   75 mg at 09/11/18 0831  . cloZAPine (CLOZARIL) tablet 300 mg  300 mg Oral QHS Johnn Hai, MD   300 mg at 09/10/18 2142  . divalproex (DEPAKOTE) DR tablet 500 mg  500 mg Oral BID Patrecia Pour, NP   500 mg at 09/11/18 0831  . feeding supplement (ENSURE ENLIVE) (ENSURE ENLIVE) liquid 237 mL  237 mL Oral BID BM Sayed Apostol, [name], [name]   237 mL at 09/11/18 1055  . gabapentin (NEURONTIN) capsule 300 mg  300 mg Oral TID Johnn Hai, MD   300 mg at 09/11/18 1136  . haloperidol (HALDOL) tablet 5 mg  5 mg Oral Q6H PRN Johnn Hai, MD   5 mg at 09/04/18 2149   Or  . haloperidol lactate (HALDOL) injection 10 mg  10 mg Intramuscular Q6H PRN Johnn Hai, MD      . LORazepam (ATIVAN) tablet 1 mg  1 mg Oral Q6H PRN Johnn Hai, MD   1 mg at 09/06/18 2137   Or  . LORazepam (ATIVAN) injection 2 mg  2 mg Intramuscular Q4H PRN Johnn Hai, MD   2 mg at 09/03/18 0044  . LORazepam (ATIVAN) tablet 1 mg  1 mg Oral BID Johnn Hai, MD   1 mg at 09/11/18 0831  . magnesium hydroxide (MILK OF MAGNESIA) suspension 30 mL  30 mL Oral Daily PRN Patrecia Pour, NP   30 mL at 09/11/18 1136  . pantoprazole (PROTONIX) EC tablet 40 mg  40 mg Oral BID Johnn Hai, MD   40 mg at 09/11/18 0831  . zolpidem (AMBIEN) tablet 5 mg  5 mg Oral QHS [name], [name], MD   5 mg at 09/10/18 2142    Lab Results: No results found for this or any previous visit (from the past 48  hour(s)).  Blood Alcohol level:  Lab Results  Component Value Date   ETH <10 08/31/2018   ETH <10 28/31/5176    Metabolic Disorder Labs: Lab Results  Component Value Date   HGBA1C 5.4 09/30/2016   MPG 108.28 09/30/2016   MPG 114 09/17/2016   No results found for: PROLACTIN Lab Results  Component Value Date   CHOL 135  09/30/2016   TRIG 104 02/25/2018   HDL 60 09/30/2016   CHOLHDL 2.3 09/30/2016   VLDL 12 09/30/2016   LDLCALC 63 09/30/2016    Physical Findings: AIMS: Facial and Oral Movements Muscles of Facial Expression: None, normal Lips and Perioral Area: None, normal Jaw: None, normal Tongue: None, normal,Extremity Movements Upper (arms, wrists, hands, fingers): None, normal Lower (legs, knees, ankles, toes): None, normal, Trunk Movements Neck, shoulders, hips: None, normal, Overall Severity Severity of abnormal movements (highest score from questions above): None, normal Incapacitation due to abnormal movements: None, normal Patient's awareness of abnormal movements (rate only patient's report): No Awareness, Dental Status Current problems with teeth and/or dentures?: No Does patient usually wear dentures?: No  CIWA:  CIWA-Ar Total: 2 COWS:  COWS Total Score: 4  Musculoskeletal: Strength & Muscle Tone: decreased Gait & Station: unsteady Patient leans: N/A  Psychiatric Specialty Exam: Physical Exam  Nursing note and vitals reviewed. Constitutional: He appears well-developed and well-nourished.  HENT:  Head: Normocephalic and atraumatic.  Eyes: Pupils are equal, round, and reactive to light. Conjunctivae are normal.  Neck: Normal range of motion.  Cardiovascular: Regular rhythm and normal heart sounds.  Respiratory: Effort normal.  GI: Soft.  Musculoskeletal: Normal range of motion.  Neurological: He is alert.  Psychiatric: His speech is normal and behavior is normal. Thought content normal. His mood appears anxious. He is not aggressive. Thought content  is not paranoid. Cognition and memory are impaired. He expresses impulsivity. He expresses no homicidal and no suicidal ideation.    Review of Systems  Constitutional: Negative.   HENT: Negative.   Eyes: Negative.   Respiratory: Negative.   Cardiovascular: Negative.   Gastrointestinal: Negative.   Musculoskeletal: Negative.   Skin: Negative.   Neurological: Negative.   Psychiatric/Behavioral: The patient is nervous/anxious.     Blood pressure 97/79, pulse (!) 103, temperature 97.7 F (36.5 C), temperature source Oral, resp. rate 18, height 5\' 11"  (1.803 m), weight 67.1 kg, SpO2 100 %.Body mass index is 20.64 kg/m.  General Appearance: Casual  Eye Contact:  Fair  Speech:  Garbled and Slurred  Volume:  Decreased  Mood:  Euthymic  Affect:  Constricted  Thought Process:  Disorganized at times, baseline  Orientation:  Self  Thought Content:  Logical at times  Suicidal Thoughts:  No  Homicidal Thoughts:  No  Memory:  Immediate;   Fair Recent;   Poor Remote;   Poor  Judgement:  Impaired  Insight:  Shallow  Psychomotor Activity:  Decreased  Concentration:  Fair  Recall:  Poor  Fund of Knowledge:  Poor  Language:  Poor  Akathisia:  No  Handed:  Right  AIMS (if indicated):     Assets:  Desire for Improvement Social Support  ADL's:  Impaired  Cognition:  Impaired,  Moderate  Sleep:  Number of Hours: 6.5    Treatment Plan Summary: Plan:  Review of chart, vital signs, medications, and notes. Patient has remained stable, but is now not allowed back to group home. His legal guardian Is his sister. CSW is assisting with seeking placement Individual and group therapy encouraged Schizoaffective disorder, bipolar type:   -Continued Depakote 500 mg BID -Continued Clozaril 300 mg at bedtime -PRN oral and IM agitation medications  Anxiety:   -Continued gabapentin 300 mg TID Insomnia: -Continued Ambien 5 mg at bedtime Coping skills for mental illness and crisis stabilization and  management   Lewis Shock, [name] 09/11/2018, 4:23 PM

## 2018-09-11 NOTE — Progress Notes (Signed)
CSW spoke with [facility] to inquire if they were still willing to take pt back. Shirlean Mylar reported that pts guardian did not want him to return to their group home, but she will contact her superiors to see if they are obligated to take pt back due to not giving him a 60 day notice. Shirlean Mylar reported she would call CSW back after speaking to her supervisor.  Shirlean Mylar returned [facility] phone call and reported that she spoke with her superior and they reported once the guardian stated they did not want pt to return it voids them having to give him a 60 day notice and they cannot take him back at this time.    [name], MSW, LCSW Clinical Social Work 09/11/2018 2:20 PM

## 2018-09-11 NOTE — BHH Counselor (Signed)
CSW spoke with Care Coordinator regarding if [facility] would pay for a sitter or someone to go into previous group home and sit with the patient to provide the 24 hour observation.  He reports that he is not sure and will ask his supervior and follow up with this CSW tomorrow.  He reports that the "the issue at hand is that his current setting doesn't require an overnight staff at the current providers".  He also reports that the patient has "maxed out" his budget.  He reports that he is still looking for a group home that offers overnight staff though he has not had much luck.  Assunta Curtis, MSW, LCSW 09/11/2018 11:16 AM

## 2018-09-11 NOTE — Progress Notes (Signed)
Recreation Therapy Notes  Date: 09/11/2018  Time: 9:30 am  Location: Craft room  Behavioral response: Appropriate   Intervention Topic: Anger Management   Discussion/Intervention:  Group content on today was focused on anger management. The group defined anger and reasons they become angry. Individuals expressed negative way they have dealt with anger in the past. Patients stated some positive ways they could deal with anger in the future. The group described how anger can affect your health and daily plans. Individuals participated in the intervention "Score your anger" where they had a chance to answer questions about themselves and get a score of their anger.  Clinical Observations/Feedback:  Patient came to group and explained that when he is angry he takes a time out and tries to talk it over. Individual was social with peers and staff while participating in the intervention.  Chief [facility] LRT/CTRS         Jillaine Waren 09/11/2018 10:50 AM

## 2018-09-11 NOTE — Plan of Care (Signed)
Patient is appropriate in the unit.No aggressive behaviors noted.Compliant with medications.Attended groups.Denies SI,HI and AVH.Appetite and energy level good.Patient ambulates in the room with a steady gait.Maintained safety with 1:1 sitter.Support and encouragement given.

## 2018-09-11 NOTE — Progress Notes (Signed)
Patient is continued on 1&1 to protect against fall due to poor gait and walking sluggishly, patient is safe from fall no incident.

## 2018-09-11 NOTE — Progress Notes (Signed)
When staff was trying to redirect patient from taking water frequently and dropping on the floor Patient hit staff multiple times on her left arm.Patient pushed the staff to the wall.Patient is redirected back to the room.PRN medications given.Patient stated that he is going to behave.Patient continue with the 1:1 sitter.

## 2018-09-12 NOTE — B[facility] Group Notes (Signed)
LCSW Group Therapy Note  09/12/2018 2:23 PM  Type of Therapy/Topic:  Group Therapy:  Emotion Regulation  Participation Level:  Minimal   Description of Group:   The purpose of this group is to assist patients in learning to regulate negative emotions and experience positive emotions. Patients will be guided to discuss ways in which they have been vulnerable to their negative emotions. These vulnerabilities will be juxtaposed with experiences of positive emotions or situations, and patients will be challenged to use positive emotions to combat negative ones. Special emphasis will be placed on coping with negative emotions in conflict situations, and patients will process healthy conflict resolution skills.  Therapeutic Goals: 1. Patient will identify two positive emotions or experiences to reflect on in order to balance out negative emotions 2. Patient will label two or more emotions that they find the most difficult to experience 3. Patient will demonstrate positive conflict resolution skills through discussion and/or role plays  Summary of Patient Progress: Patient was present in group.  Patient displayed difficulty in programming.  Patient was asked to draw his emotions and patient drew a clock and a book.  Therapeutic Modalities:   Cognitive Behavioral Therapy Feelings Identification Dialectical Behavioral Therapy  Assunta Curtis, MSW, LCSW 09/12/2018 2:23 PM

## 2018-09-12 NOTE — Progress Notes (Signed)
[facility] MD Progress Note  09/12/2018 10:25 AM Austin Gates  MRN:  [id number] Subjective: Patient is a 58 year old male with a past psychiatric history significant for schizoaffective disorder as well as developmental delay with an IQ lower than 82 who was admitted on 09/01/2018 secondary to volatile behavior and disruptive behavior at his group home.  Objective: Patient is seen and examined.  Patient is a 58 year old male with a past psychiatric history significant for schizoaffective disorder and intellectual disability.  In follow-up today the patient denied auditory or visual hallucinations.  He denied any suicidal or homicidal ideation.  His current medications include Clozaril 300 mg p.o. nightly, Plavix, Depakote, PRN Haldol, PRN lorazepam, Protonix and Ambien.  Review of the electronic medical record revealed that the plan was for the patient to be discharged today.  There was a placement issue in place.  Social work had apparently spoken to the group home, and the patient had not received a 90-day or 60-day notice that he would not be accepted back to the facility.  The staff at the group home stated that the patient's guardian who is also his sister who notified the group home that he would not be returning to that facility sometime during the course the hospitalization.  Review of the electronic medical record revealed that his intellectual disability has an IQ of approximately 67.  He denied auditory or visual hallucinations.  He denied suicidal or homicidal ideation.  His vital signs are stable, he is afebrile.  He slept 6.5 hours last night.  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 Hypertension   . Intellectual disability   . Obsessive-compulsive disorder   . Schizo-affective schizophrenia [facility])     Past Surgical History:  Procedure Laterality Date  . COLONOSCOPY WITH PROPOFOL N/A 12/28/2017   Procedure: COLONOSCOPY WITH PROPOFOL;  Surgeon: Jonathon Bellows, MD;  Location: [facility] ENDOSCOPY;  Service: Gastroenterology;  Laterality: N/A;  . HYDROCELE EXCISION Bilateral 02/22/2018   Procedure: bilateral HYDROCELECTOMY ADULT;  Surgeon: Cleon Gustin, MD;  Location: [place];  Service: Urology;  Laterality: Bilateral;  . INSERTION OF ILIAC STENT Left 02/22/2018   Procedure: INSERTION LEFT SUPERIOR FEMORAL ARTERY USING 6MM X 5CM VIABHON STENT with mynx device closure on right femoral artery;  Surgeon: Waynetta Sandy, MD;  Location: [place];  Service: Vascular;  Laterality: Left;  . KNEE ARTHROSCOPY    . SCROTAL EXPLORATION N/A 02/22/2018   Procedure: SCROTUM EXPLORATION;  Surgeon: Cleon Gustin, MD;  Location: [place];  Service: Urology;  Laterality: N/A;  . UPPER EXTREMITY ANGIOGRAM Left 02/22/2018   Procedure: left lower EXTREMITY ANGIOGRAM;  Surgeon: Waynetta Sandy, MD;  Location: [facility] OR;  Service: Vascular;  Laterality: Left;   Family History:  Family History  Problem Relation Age of Onset  . Diabetes Mother    Family Psychiatric  History: See admission H&P Social History:  Social History   Substance and Sexual Activity  Alcohol Use No     Social History   Substance and Sexual Activity  Drug Use No    Social History   Socioeconomic History  . Marital status: Single    Spouse name: Not on file  . Number of  children: Not on file  . Years of education: Not on file  . Highest education level: Not on file  Occupational History  . Occupation: Disabled  Social Needs  . Financial resource strain: Not on file  . Food insecurity    Worry: Not on file    Inability: Not on file  . Transportation needs     Medical: Not on file    Non-medical: Not on file  Tobacco Use  . Smoking status: Former Smoker    Types: Cigarettes  . Smokeless tobacco: Never Used  Substance and Sexual Activity  . Alcohol use: No  . Drug use: No  . Sexual activity: Never  Lifestyle  . Physical activity    Days per week: Not on file    Minutes per session: Not on file  . Stress: Not on file  Relationships  . Social Herbalist on phone: Not on file    Gets together: Not on file    Attends religious service: Not on file    Active member of club or organization: Not on file    Attends meetings of clubs or organizations: Not on file    Relationship status: Not on file  Other Topics Concern  . Not on file  Social History Narrative   ** Merged History Encounter **       Additional Social History:                         Sleep: Good  Appetite:  Fair  Current Medications: Current Facility-Administered Medications  Medication Dose Route Frequency Provider Last Rate Last Dose  . acetaminophen (TYLENOL) tablet 650 mg  650 mg Oral Q6H PRN Patrecia Pour, NP   650 mg at 09/09/18 1253  . alum & mag hydroxide-simeth (MAALOX/MYLANTA) 200-200-20 MG/5ML suspension 30 mL  30 mL Oral Q4H PRN Patrecia Pour, NP   30 mL at 09/11/18 1823  . clopidogrel (PLAVIX) tablet 75 mg  75 mg Oral Daily Patrecia Pour, NP   75 mg at 09/12/18 0857  . cloZAPine (CLOZARIL) tablet 300 mg  300 mg Oral QHS Johnn Hai, MD   300 mg at 09/11/18 2127  . divalproex (DEPAKOTE) DR tablet 500 mg  500 mg Oral BID Patrecia Pour, NP   500 mg at 09/12/18 0857  . feeding supplement (ENSURE ENLIVE) (ENSURE ENLIVE) liquid 237 mL  237 mL Oral BID BM Money, Lowry Ram, [name]   237 mL at 09/12/18 0932  . gabapentin (NEURONTIN) capsule 300 mg  300 mg Oral TID Johnn Hai, MD   300 mg at 09/12/18 0857  . haloperidol (HALDOL) tablet 5 mg  5 mg Oral Q6H PRN Johnn Hai, MD   5 mg at 09/11/18 2127   Or  . haloperidol lactate (HALDOL)  injection 10 mg  10 mg Intramuscular Q6H PRN Johnn Hai, MD      . LORazepam (ATIVAN) tablet 1 mg  1 mg Oral Q6H PRN Johnn Hai, MD   1 mg at 09/11/18 2128   Or  . LORazepam (ATIVAN) injection 2 mg  2 mg Intramuscular Q4H PRN Johnn Hai, MD   2 mg at 09/03/18 0044  . LORazepam (ATIVAN) tablet 1 mg  1 mg Oral BID Johnn Hai, MD   1 mg at 09/12/18 0856  . magnesium hydroxide (MILK OF MAGNESIA) suspension 30 mL  30 mL Oral Daily PRN Patrecia Pour, NP   30 mL at  09/11/18 1136  . pantoprazole (PROTONIX) EC tablet 40 mg  40 mg Oral BID Johnn Hai, MD   40 mg at 09/12/18 0857  . zolpidem (AMBIEN) tablet 5 mg  5 mg Oral QHS [name], [name], MD   5 mg at 09/11/18 2127    Lab Results: No results found for this or any previous visit (from the past 48 hour(s)).  Blood Alcohol level:  Lab Results  Component Value Date   ETH <10 08/31/2018   ETH <10 16/11/9602    Metabolic Disorder Labs: Lab Results  Component Value Date   HGBA1C 5.4 09/30/2016   MPG 108.28 09/30/2016   MPG 114 09/17/2016   No results found for: PROLACTIN Lab Results  Component Value Date   CHOL 135 09/30/2016   TRIG 104 02/25/2018   HDL 60 09/30/2016   CHOLHDL 2.3 09/30/2016   VLDL 12 09/30/2016   LDLCALC 63 09/30/2016    Physical Findings: AIMS: Facial and Oral Movements Muscles of Facial Expression: None, normal Lips and Perioral Area: None, normal Jaw: None, normal Tongue: None, normal,Extremity Movements Upper (arms, wrists, hands, fingers): None, normal Lower (legs, knees, ankles, toes): None, normal, Trunk Movements Neck, shoulders, hips: None, normal, Overall Severity Severity of abnormal movements (highest score from questions above): None, normal Incapacitation due to abnormal movements: None, normal Patient's awareness of abnormal movements (rate only patient's report): No Awareness, Dental Status Current problems with teeth and/or dentures?: No Does patient usually wear dentures?: No   CIWA:  CIWA-Ar Total: 2 COWS:  COWS Total Score: 4  Musculoskeletal: Strength & Muscle Tone: decreased Gait & Station: unsteady Patient leans: N/A  Psychiatric Specialty Exam: Physical Exam  Nursing note and vitals reviewed. Constitutional: He appears well-developed and well-nourished.  HENT:  Head: Normocephalic and atraumatic.  Respiratory: Effort normal.  Neurological: He is alert.    ROS  Blood pressure 97/79, pulse (!) 103, temperature 97.7 F (36.5 C), temperature source Oral, resp. rate 18, height 5\' 11"  (1.803 m), weight 67.1 kg, SpO2 100 %.Body mass index is 20.64 kg/m.  General Appearance: Casual  Eye Contact:  Fair  Speech:  Garbled and Slurred  Volume:  Decreased  Mood:  Euthymic  Affect:  Congruent  Thought Process:  Disorganized and Descriptions of Associations: Circumstantial  Orientation:  Negative  Thought Content:  Logical  Suicidal Thoughts:  No  Homicidal Thoughts:  No  Memory:  Immediate;   Poor Recent;   Poor Remote;   Poor  Judgement:  Impaired  Insight:  Lacking  Psychomotor Activity:  Normal  Concentration:  Concentration: Fair and Attention Span: Fair  Recall:  Poor  Fund of Knowledge:  Poor  Language:  Fair  Akathisia:  Negative  Handed:  Right  AIMS (if indicated):     Assets:  Desire for Improvement Resilience  ADL's:  Impaired  Cognition:  Impaired,  Moderate  Sleep:  Number of Hours: 6.75     Treatment Plan Summary: Daily contact with patient to assess and evaluate symptoms and progress in treatment, Medication management and Plan : Patient is seen and examined.  Patient is a 58 year old male with the above-stated past psychiatric history who is seen in follow-up.   Diagnosis: #1 schizoaffective disorder, #2 intellectual deficit, #3 peripheral vascular disease, #4 GERD  Patient is seen and examined.  Patient is a 58 year old male with the above-stated past psychiatric history seen in follow-up.  There appear to be some issues  with placement.  Review of the chart shows that the  sister who is the guardian is unwilling to allow him to return to the group home he had previously been at.  I spoke to her on the phone today, and she referred me to the [facility] entity.  She told me she would call me back later in the day.  I will have social work Armed forces operational officer and see what can be done.  From a psychiatric standpoint he is stable enough to return to his group home, and the sister needs to be actively involved with the care given that she is the guardian.  Review of his laboratories and medications appear to be stable at this point.  His clozapine level on 7/13 was 749, nor clozapine was 195, and his Depakote on 7/18 was 29.  He is mildly anemic, and his liver function enzymes are normal.  Hopefully we can get some of these issues resolved. 1.  Continue Clozaril 300 mg p.o. nightly for psychosis. 2.  Continue Plavix 75 mg p.o. daily for peripheral and cardiac perfusion. 3.  Continue Depakote ER 500 mg p.o. twice daily for mood stability. 4.  Continue Neurontin 300 mg p.o. 3 times daily for chronic pain, mood stability, and agitation. 5.  Continue PRN haloperidol either 5 mg p.o. or 10 mg IM for PRN agitation. 6.  Continue lorazepam 1 mg p.o. or 2 mg IM PRN agitation. 7.  Continue Protonix 40 mg p.o. daily for gastric protection. 8.  Continue Ambien 5 mg p.o. nightly for insomnia. 9.  Disposition planning-in progress. Sharma Covert, MD 09/12/2018, 10:25 AM

## 2018-09-12 NOTE — Tx Team (Signed)
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 acetaminophen (TYLENOL) tablet 650 mg  650 mg Oral Q6H PRN Patrecia Pour, NP   650 mg at 09/09/18 1253  . alum & mag hydroxide-simeth (MAALOX/MYLANTA) 200-200-20 MG/5ML suspension 30 mL  30 mL Oral Q4H PRN Patrecia Pour, NP   30 mL at 09/11/18 1823  . clopidogrel (PLAVIX) tablet 75 mg  75 mg Oral Daily Patrecia Pour, NP   75 mg at 09/12/18 0857  . cloZAPine (CLOZARIL) tablet 300 mg  300 mg Oral QHS Johnn Hai, MD   300 mg at 09/11/18 2127  . divalproex (DEPAKOTE) DR tablet 500 mg  500 mg Oral BID Patrecia Pour, NP   500 mg at 09/12/18 0857  . feeding supplement (ENSURE ENLIVE) (ENSURE ENLIVE) liquid 237 mL  237 mL Oral BID BM Money, Lowry Ram, [name]   237 mL at 09/12/18 1528  . gabapentin (NEURONTIN) capsule 300 mg  300 mg Oral TID Johnn Hai, MD   300 mg at 09/12/18 1232  . haloperidol (HALDOL) tablet 5 mg  5 mg Oral Q6H PRN Johnn Hai, MD   5 mg at 09/11/18 2127   Or  . haloperidol lactate (HALDOL) injection 10 mg  10 mg Intramuscular Q6H PRN Johnn Hai, MD      . LORazepam (ATIVAN) tablet 1 mg  1 mg Oral Q6H PRN Johnn Hai, MD   1 mg at 09/11/18 2128   Or  . LORazepam (ATIVAN) injection 2 mg  2 mg Intramuscular Q4H PRN Johnn Hai, MD   2 mg at 09/03/18 0044  . LORazepam (ATIVAN) tablet 1 mg  1 mg Oral BID Johnn Hai, MD   1 mg at 09/12/18 0856  . magnesium hydroxide (MILK OF MAGNESIA) suspension 30 mL  30 mL Oral Daily PRN Patrecia Pour, NP   30 mL at 09/11/18 1136   . pantoprazole (PROTONIX) EC tablet 40 mg  40 mg Oral BID Johnn Hai, MD   40 mg at 09/12/18 0857  . zolpidem (AMBIEN) tablet 5 mg  5 mg Oral QHS [name], [name], MD   5 mg at 09/11/18 2127   PTA Medications: Medications Prior to Admission  Medication Sig Dispense Refill Last Dose  . acetaminophen (TYLENOL) 500 MG tablet Take 500 mg by mouth every 6 (six) hours as needed for mild pain.     . benztropine (COGENTIN) 1 MG tablet Take 1 mg by mouth 2 (two) times daily.     . clopidogrel (PLAVIX) 75 MG tablet Take 1 tablet (75 mg total) by mouth daily. 30 tablet 0   . cloZAPine (CLOZARIL) 100 MG tablet Take 1.5 tablets (150 mg total) by mouth 3 (three) times daily. At 8 AM, 5PM, 8PM per Dr. Darleene Cleaver (Patient taking differently: Take 100-200 mg by mouth 3 (three) times daily. One tablet (100 mg) At 8 AM, and 2PM, two tablets (200 mg) at bedtime per Dr. Darleene Cleaver)     . clozapine (CLOZARIL) 50 MG tablet Take 50 mg by mouth 2 (two) times daily. At 8 am and 2 pm     . diphenhydrAMINE (BENADRYL) 50  MG capsule Take 50 mg by mouth every 6 (six) hours as needed for allergies (sedation).      . haloperidol (HALDOL) 5 MG tablet Take 5 mg by mouth daily as needed (severe agitation).      . LORazepam (ATIVAN) 1 MG tablet Take 1 mg by mouth daily as needed (severe agitation).      . Maltodextrin-Xanthan Gum (RESOURCE THICKENUP CLEAR) POWD Take 120 g by mouth as needed. (Patient not taking: Reported on 04/07/2018) 1 Can 1   . traZODone (DESYREL) 100 MG tablet Take 100 mg by mouth at bedtime.     . [DISCONTINUED] divalproex (DEPAKOTE) 500 MG DR tablet Take 1 tablet (500 mg total) by mouth 2 (two) times daily. 30 tablet 0     Patient Stressors: Financial difficulties Health problems Medication change or noncompliance  Patient Strengths: Motivation for treatment/growth Special hobby/interest Supportive family/friends  Treatment Modalities: Medication Management, Group therapy, Case management,  1 to 1  session with clinician, Psychoeducation, Recreational therapy.   Physician Treatment Plan for Primary Diagnosis: Schizoaffective disorder, bipolar type ([place]) Long Term Goal(s): Improvement in symptoms so as ready for discharge Improvement in symptoms so as ready for discharge   Short Term Goals: Ability to identify and develop effective coping behaviors will improve Ability to maintain clinical measurements within normal limits will improve Compliance with prescribed medications will improve Ability to verbalize feelings will improve Ability to disclose and discuss suicidal ideas Ability to demonstrate self-control will improve Ability to identify and develop effective coping behaviors will improve  Medication Management: Evaluate patient's response, side effects, and tolerance of medication regimen.  Therapeutic Interventions: 1 to 1 sessions, Unit Group sessions and Medication administration.  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   Protein-calorie malnutrition, severe  Long Term Goal(s): Improvement in symptoms so as ready for discharge Improvement in symptoms so as ready for discharge   Short Term Goals: Ability to identify and develop effective coping behaviors will improve Ability to maintain clinical measurements within normal limits will improve Compliance with prescribed medications will improve Ability to verbalize feelings will improve Ability to disclose and discuss suicidal ideas Ability to demonstrate self-control will improve Ability to identify and develop effective coping behaviors will improve     Medication Management: Evaluate patient's response, side effects, and tolerance of medication regimen.  Therapeutic Interventions: 1 to 1 sessions, Unit Group sessions and Medication  administration.  Evaluation of Outcomes: Progressing   RN Treatment Plan for Primary Diagnosis: Schizoaffective disorder, bipolar type ([place]) Long Term Goal(s): Knowledge of disease and therapeutic regimen to maintain health will improve  Short Term Goals: Ability to demonstrate self-control, Ability to identify and develop effective coping behaviors will improve and Compliance with prescribed medications will improve  Medication Management: RN will administer medications as ordered by provider, will assess and evaluate patient's response and provide education to patient for prescribed medication. RN will report any adverse and/or side effects to prescribing provider.  Therapeutic Interventions: 1 on 1 counseling sessions, Psychoeducation, Medication administration, Evaluate responses to treatment, Monitor vital signs and CBGs as ordered, Perform/monitor CIWA, COWS, AIMS and Fall Risk screenings as ordered, Perform wound care treatments as ordered.  Evaluation of Outcomes: Progressing   LCSW Treatment Plan for Primary Diagnosis: Schizoaffective disorder, bipolar type ([place]) Long Term Goal(s): Safe transition to appropriate next level of care at discharge, Engage patient in  therapeutic group addressing interpersonal concerns.  Short Term Goals: Engage patient in aftercare planning with referrals and resources  Therapeutic Interventions: Assess for all discharge needs, 1 to 1 time with Social worker, Explore available resources and support systems, Assess for adequacy in community support network, Educate family and significant other(s) on suicide prevention, Complete Psychosocial Assessment, Interpersonal group therapy.  Evaluation of Outcomes: Progressing   Progress in Treatment: Attending groups: Yes. Participating in groups: Yes. Taking medication as prescribed: Yes. Toleration medication: Yes. Family/Significant other contact made: Yes, individual(s) contacted:  [name],  legal guardian Patient understands diagnosis: No. Discussing patient identified problems/goals with staff: No. Medical problems stabilized or resolved: Yes. Denies suicidal/homicidal ideation: Yes. Issues/concerns per patient self-inventory: No. Other: NA  New problem(s) identified: No, Describe:  none reported  New Short Term/Long Term Goal(s): Pt did not attend today's meeting. Nurse Tech reports the pt is sedated and resting in his room. Update 09/07/18: medication management for mood stabilization; development of comprehensive mental wellness plan.  Patient Goals:  Goal not obtained at this time. Update 09/07/18:  Patient reports goal is to "got to a nursing home".      Discharge Plan or Barriers: Pt will follow up at [facility] in [place] where he is an established patient.  Update 09/07/18:  Patient has an aftercare appointment scheduled with [facility] in [place].  Patient remains on the unit to assist with Care Coordinator and legal guardian in finding placement for the patient as the guardian has declined for the patient to return to his previous group home. Patient is at baseline for care. Patient has tentatively been approved for [facility] and placement was scheduled for 7/20, however, that may not happen.  Update 7/22:  Patient remains on the unit awaiting placement. Placement at [facility] was not successful and they denied the patient due to concerns that patient was high needs.  Care Coordinator continues to look for placement.  Patient has become aggressive with hospital staff at this time.   Reason for Continuation of Hospitalization: Medication stabilization  Estimated Length of Stay: 3-5 days  Recreational Therapy: Patient: N/A Patient Goal: Patient will engage in groups without prompting or encouragement from LRT x3 group sessions within 5 recreation therapy group sessions   Attendees: Patient: 09/12/2018 3:40 PM  Physician: Dr. Mallie Darting,  MD 09/12/2018 3:40 PM  Nursing:  09/12/2018 3:40 PM  RN Care Manager: 09/12/2018 3:40 PM  Social Worker:  Assunta Curtis LCSW 09/12/2018 3:40 PM  Recreational Therapist:  09/12/2018 3:40 PM  Other: [name], LCSW 09/12/2018 3:40 PM  Other:  09/12/2018 3:40 PM  Other: 09/12/2018 3:40 PM    Scribe for Treatment Team: [name], LCSW 09/12/2018 3:40 PM

## 2018-09-12 NOTE — Progress Notes (Signed)
Patient alert and oriented x 3 with periods of confusion to situation, he denies SI/HI/AVH, his thoughts are disorganized and incoherent, speech is garbled and unclear he appears less irritable, he was frequently redirected and reoriented for safety, he continues to be on 1:1 no aggressive or hostile behavior towards staff, patient denies SI/HI/AVH will continue to monitor closely

## 2018-09-12 NOTE — Plan of Care (Signed)
In the milieu, restless and frequently calling staff. Redirectable. Taking medications and eating well.

## 2018-09-12 NOTE — Progress Notes (Signed)
Recreation Therapy Notes   Date: 09/12/2018  Time: 9:30 am  Location: Craft room  Behavioral response: Redirection needed  Intervention Topic: Goals  Discussion/Intervention:  Group content on today was focused on goals. Patients described what goals are and how they define goals. Individuals expressed how they go about setting goals and reaching them. The group identified how important goals are and if they make short term goals to reach long term goals. Patients described how many goals they work on at a time and what affects them not reaching their goal. Individuals described how much time they put into planning and obtaining their goals. The group participated in the intervention "My Goal Board" and made personal goal boards to help them achieve their goal. Clinical Observations/Feedback:  Patient came to group and stated his goal is to get up out of here. He needed much redirection and could not focus on the topic/task at hand.  Elihue Ebert LRT/CTRS         Safi Culotta 09/12/2018 12:10 PM

## 2018-09-12 NOTE — Progress Notes (Signed)
Patient has remained in the milieu and monitored on 1:1. Continues to display disorganized behaviors and frequently redirected. Continues to require a wheelchair for fall precautions. Taking medications as prescribed. Pleasant at times but had episodes of irritability. Safety maintained.

## 2018-09-12 NOTE — Progress Notes (Signed)
Patient remains on 1&1 for safety protocol , patient has poor gaite and staggers if left on his own and becomes a safety risk,  Wheel chair is provided for mobility, ADLs are  provided fluids and nutrition is provided also, patient remains safe no falls, noted.

## 2018-09-12 NOTE — Plan of Care (Signed)
  Problem: Self-Concept: Goal: Will verbalize positive feelings about self Outcome: Progressing  Patient verbalized positive feelings about self.  

## 2018-09-13 MED ORDER — HALOPERIDOL 5 MG PO TABS
5.0000 mg | ORAL_TABLET | Freq: Four times a day (QID) | ORAL | Status: DC | PRN
  Administered 2018-09-27: 21:00:00 5 mg via ORAL
  Filled 2018-09-13: qty 1

## 2018-09-13 MED ORDER — HALOPERIDOL LACTATE 5 MG/ML IJ SOLN
10.0000 mg | Freq: Four times a day (QID) | INTRAMUSCULAR | Status: DC | PRN
  Administered 2018-09-13: 10 mg via INTRAMUSCULAR
  Filled 2018-09-13: qty 2

## 2018-09-13 MED ORDER — CLOZAPINE 100 MG PO TABS
400.0000 mg | ORAL_TABLET | Freq: Every day | ORAL | Status: DC
  Administered 2018-09-13 – 2018-09-14 (×2): 400 mg via ORAL
  Filled 2018-09-13 (×2): qty 4

## 2018-09-13 MED ORDER — ZIPRASIDONE MESYLATE 20 MG IM SOLR
20.0000 mg | Freq: Once | INTRAMUSCULAR | Status: AC
  Administered 2018-09-13: 16:00:00 20 mg via INTRAMUSCULAR
  Filled 2018-09-13: qty 20
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
## 2018-09-13 NOTE — Progress Notes (Signed)
Patient is in bed relaxed and comfortable ADLs are provided , 1&1 continues for safety protocol for fall preventions.

## 2018-09-13 NOTE — BHH Counselor (Signed)
CSW followed up with Care Coordinator, Zipporah Plants, [phone] in regards to question the day before if [facility] would be able to cover for the patient to have 24 hour supervision.  CSW was informed "that could be a possibility but the issue at hand is that the guardian is absolutely refusing to allow him to return their".  He reports that the guardian is refusing due to staff at the group home and that she was only willing to allow him to return if the staff was changed.  Care Coordinator reports that he has sent referral to several locations all over the state and there hasn't been much success.  He reports that he is still waiting to hear from several homes.  He reports a strong belief that the behavior are from a mental health stance.  CSW stated a belief that behaviors may be related to IDD, however, would allow for the psychiatrist to address this issue.  He does report a belief that patient may need a state hospital stay and that pt's issues are psychiatric in nature.  CSW reports that she will staff with the team and follow up.   Assunta Curtis, MSW, LCSW 09/13/2018 1:34 PM
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
## 2018-09-13 NOTE — Progress Notes (Signed)
Recreation Therapy Notes   Date: 09/13/2018  Time: 9:30 am  Location: Craft room  Behavioral response: Appropriate   Intervention Topic: Relaxation   Discussion/Intervention:  Group content today was focused on relaxation. The group defined relaxation and identified healthy ways to relax. Individuals expressed how much time they spend relaxing. Patients expressed how much their life would be if they did not make time for themselves to relax. The group stated ways they could improve their relaxation techniques in the future.  Individuals participated in the intervention "Time to Relax" where they had a chance to experience different relaxation techniques.  Clinical Observations/Feedback:  Patient came to group and was focused on what peers and staff had to say about relaxation. Individual was social with peers and staff while participating in the intervention.  Seri Kimmer LRT/CTRS         Austin Gates 09/13/2018 10:28 AM
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
## 2018-09-13 NOTE — BHH Group Notes (Signed)
Balance In Life 09/13/2018 1PM  Type of Therapy/Topic:  Group Therapy:  Balance in Life  Participation Level:  None  Description of Group:   This group will address the concept of balance and how it feels and looks when one is unbalanced. Patients will be encouraged to process areas in their lives that are out of balance and identify reasons for remaining unbalanced. Facilitators will guide patients in utilizing problem-solving interventions to address and correct the stressor making their life unbalanced. Understanding and applying boundaries will be explored and addressed for obtaining and maintaining a balanced life. Patients will be encouraged to explore ways to assertively make their unbalanced needs known to significant others in their lives, using other group members and facilitator for support and feedback.  Therapeutic Goals: 1. Patient will identify two or more emotions or situations they have that consume much of in their lives. 2. Patient will identify signs/triggers that life has become out of balance:  3. Patient will identify two ways to set boundaries in order to achieve balance in their lives:  4. Patient will demonstrate ability to communicate their needs through discussion and/or role plays  Summary of Patient Progress: Pt did not participate in group discussion and left group early.   Therapeutic Modalities:   Cognitive Behavioral Therapy Solution-Focused Therapy Assertiveness Training  Laura Caldas [name], LCSW
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
## 2018-09-14 LAB — CBC WITH DIFFERENTIAL/PLATELET
Abs Immature Granulocytes: 0.09 10*3/uL — ABNORMAL HIGH (ref 0.00–0.07)
Basophils Absolute: 0 10*3/uL (ref 0.0–0.1)
Basophils Relative: 0 %
Eosinophils Absolute: 0.1 10*3/uL (ref 0.0–0.5)
Eosinophils Relative: 1 %
HCT: 37.7 % — ABNORMAL LOW (ref 39.0–52.0)
Hemoglobin: 11.8 g/dL — ABNORMAL LOW (ref 13.0–17.0)
Immature Granulocytes: 1 %
Lymphocytes Relative: 11 %
Lymphs Abs: 0.8 10*3/uL (ref 0.7–4.0)
MCH: 29.9 pg (ref 26.0–34.0)
MCHC: 31.3 g/dL (ref 30.0–36.0)
MCV: 95.4 fL (ref 80.0–100.0)
Monocytes Absolute: 0.4 10*3/uL (ref 0.1–1.0)
Monocytes Relative: 6 %
Neutro Abs: 5.8 10*3/uL (ref 1.7–7.7)
Neutrophils Relative %: 81 %
Platelets: 186 10*3/uL (ref 150–400)
RBC: 3.95 MIL/uL — ABNORMAL LOW (ref 4.22–5.81)
RDW: 14.7 % (ref 11.5–15.5)
WBC: 7.3 10*3/uL (ref 4.0–10.5)
nRBC: 0.6 % — ABNORMAL HIGH (ref 0.0–0.2)

## 2018-09-14 MED ORDER — DIVALPROEX SODIUM 250 MG PO DR TAB
250.0000 mg | DELAYED_RELEASE_TABLET | ORAL | Status: AC
  Administered 2018-09-14: 250 mg via ORAL
  Filled 2018-09-14: qty 1

## 2018-09-14 MED ORDER — DIVALPROEX SODIUM 250 MG PO DR TAB
750.0000 mg | DELAYED_RELEASE_TABLET | Freq: Two times a day (BID) | ORAL | Status: DC
  Administered 2018-09-14 – 2018-10-04 (×40): 750 mg via ORAL
  Filled 2018-09-14 (×40): qty 3

## 2018-09-14 NOTE — Progress Notes (Signed)
1:1 Patient Hourly Rounding   1000: Patient is in the dayroom/community room, with staff and the other members on the unit, and his assigned safety sitter is present at his side.   1400: Patient is outside, in the courtyard with staff and other members on the unit, with his assigned safety sitter present at his side.  1800: Patient is in the community room with staff and his assigned safety sitter present at his side.
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
## 2018-09-14 NOTE — Progress Notes (Signed)
Patient on 1:1 for aggressive behavior , noted restless in the restrain chair, yelling and screaming, threatening staff, he was not receptive to staff.patient was medicated with lorazepam IM for agitation,  He was noted breathing normally unlabored he can move his limbs in the restrain not too tight, 15 minutes safety checks maintained  will continue to monitor./

## 2018-09-14 NOTE — Progress Notes (Signed)
Patient released form restrain, he appeared calm and cooperative stated " I want to go to bed" assisted patient to bed, respiration even non labored will continue to monitor.

## 2018-09-14 NOTE — [facility] Counselor (Signed)
CSW did follow up with Gretta Cool and was unable to speak with her. CSW left a HIPPA compliant voicemail.   Assunta Curtis, MSW, LCSW 09/14/2018 1:10 PM

## 2018-09-14 NOTE — Progress Notes (Signed)
Patient is cooperating with care of ADLs and take his meds as prescribed , patient is confused , disorganized and has shown some violent tendencies , remain on 1&1 monitor  for unsteady gait and fail precautions and patient has fallen in the past.

## 2018-09-14 NOTE — [facility] Counselor (Signed)
CSW helped facilitate a Facetime interview with [place] at [phone], Gretta Cool.    The following was reviewed: Demographic information Things he may need help with , ex. ADLs Legal history Likes/Dislikes Day program  It was asked for follow up allergy information and any history of substance abuse information.  Assunta Curtis, MSW, LCSW 09/14/2018 3:07 PM

## 2018-09-14 NOTE — Plan of Care (Signed)
D- Patient alert and oriented. Patient presented in a pleasant mood on assessment stating that he slept good last night and had no major complaints to voice to this Probation officer. Patient denied SI, HI, AVH, and pain at this time. Patient also denied depression/anxiety stating that he is feeling  "good" overall. Patient had no stated goals for today.  A- Scheduled medications administered to patient, per MD orders. Support and encouragement provided.  Routine safety checks conducted every 15 minutes.  Patient informed to notify staff with problems or concerns.  R- No adverse drug reactions noted. Patient contracts for safety at this time. Patient compliant with medications and treatment plan. Patient receptive, calm, and cooperative. Patient interacts well with others on the unit.  Patient remains safe at this time.  Problem: Activity: Goal: Will identify at least one activity in which they can participate Outcome: Progressing   Problem: Coping: Goal: Ability to identify and develop effective coping behavior will improve Outcome: Progressing Goal: Ability to interact with others will improve Outcome: Progressing Goal: Demonstration of participation in decision-making regarding own care will improve Outcome: Progressing Goal: Ability to use eye contact when communicating with others will improve Outcome: Progressing   Problem: Health Behavior/Discharge Planning: Goal: Identification of resources available to assist in meeting health care needs will improve Outcome: Progressing   Problem: Self-Concept: Goal: Will verbalize positive feelings about self Outcome: Progressing   Problem: Education: Goal: Utilization of techniques to improve thought processes will improve Outcome: Progressing Goal: Knowledge of the prescribed therapeutic regimen will improve Outcome: Progressing   Problem: Activity: Goal: Interest or engagement in leisure activities will improve Outcome: Progressing Goal:  Imbalance in normal sleep/wake cycle will improve Outcome: Progressing Problem: Coping: Goal: Coping ability will improve Outcome: Progressing Goal: Will verbalize feelings Outcome: Progressing   Problem: Health Behavior/Discharge Planning: Goal: Ability to make decisions will improve Outcome: Progressing Goal: Compliance with therapeutic regimen will improve Outcome: Progressing   Problem: Role Relationship: Goal: Will demonstrate positive changes in social behaviors and relationships Outcome: Progressing   Problem: Safety: Goal: Ability to disclose and discuss suicidal ideas will improve Outcome: Progressing Goal: Ability to identify and utilize support systems that promote safety will improve Outcome: Progressing   Problem: Self-Concept: Goal: Will verbalize positive feelings about self Outcome: Progressing Goal: Level of anxiety will decrease Outcome: Progressing   Problem: Education: Goal: Ability to verbalize precipitating factors for violent behavior will improve Outcome: Progressing   Problem: Coping: Goal: Ability to verbalize frustrations and anger appropriately will improve Outcome: Progressing   Problem: Health Behavior/Discharge Planning: Goal: Ability to implement measures to prevent violent behavior in the future will improve Outcome: Progressing   Problem: Safety: Goal: Ability to demonstrate self-control will improve Outcome: Progressing Goal: Ability to redirect hostility and anger into socially appropriate behaviors will improve Outcome: Progressing

## 2018-09-14 NOTE — [facility] Counselor (Signed)
CSW called pt's guardian if it was okay if CSW contacted [place] at [phone].   CSW obtained permission.  Austin Gates, MSW, LCSW 09/14/2018 12:01 PM

## 2018-09-14 NOTE — Progress Notes (Signed)
CSW spoke with Austin Gates, care coordinator through [facility] to inquire about process for [facility] referral. [name] reported that it is a very lengthy process and they require a lot of documentation and also only review applications about 4 times a year.  [name], MSW, LCSW Clinical Social Work 09/14/2018 12:38 PM

## 2018-09-14 NOTE — [facility] Group Notes (Signed)
LCSW Group Therapy Note  09/14/2018 12:41 PM  Type of Therapy and Topic:  Group Therapy:  Feelings around Relapse and Recovery  Participation Level:  None   Description of Group:    Patients in this group will discuss emotions they experience before and after a relapse. They will process how experiencing these feelings, or avoidance of experiencing them, relates to having a relapse. Facilitator will guide patients to explore emotions they have related to recovery. Patients will be encouraged to process which emotions are more powerful. They will be guided to discuss the emotional reaction significant others in their lives may have to their relapse or recovery. Patients will be assisted in exploring ways to respond to the emotions of others without this contributing to a relapse.  Therapeutic Goals: 1. Patient will identify two or more emotions that lead to a relapse for them 2. Patient will identify two emotions that result when they relapse 3. Patient will identify two emotions related to recovery 4. Patient will demonstrate ability to communicate their needs through discussion and/or role plays   Summary of Patient Progress: Pt was present in group, but did not participate and constantly asked "what time is it" and "when is group over".    Therapeutic Modalities:   Cognitive Behavioral Therapy Solution-Focused Therapy Assertiveness Training Relapse Prevention Therapy   [name], MSW, LCSW Clinical Social Work 09/14/2018 12:41 PM
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
## 2018-09-14 NOTE — Progress Notes (Signed)
Clozapine Monitoring Note   07/17 [place] 4600 07/24 [place] 5800  Lab reported to clozapine registry Pt is eligible to receive clozapine Next labs due in one week per hospital policy on 4/48   Prudy Feeler, [place] Pharmacist 09/14/2018 10:06 AM

## 2018-09-14 NOTE — BHH Counselor (Signed)
CSW called the patient's former group home and spoke with Shirlean Mylar, [phone].   CSW asked is group home was willing to allow the patient to return to the group home if guardian approved and if [facility]ia Constable was able to provide the 24 hour supervision/sitter that the family and team feels is needed for the patient.  Shirlean Mylar reports that "once the guardian said no then that took it off the table.  We were willing to have him stay for the 60 days while we gave the notice and looked for better placement but once she said no then it was taken off the table."  CSW asked for clarification and Shirlean Mylar stated that at this time the group home is not able to accept the patient due to the sister stating that she did not want him to return despite being given the 60 day notice that him returning is no longer a viable option.  Assunta Curtis, MSW, LCSW 09/14/2018 11:38 AM

## 2018-09-14 NOTE — BHH Counselor (Signed)
CSW attempted to contact [place] at [phone], however received message that number was not in service.  CSW has reached out to care coordinator for correct contact information.  Assunta Curtis, MSW, LCSW 09/14/2018 12:04 PM

## 2018-09-15 MED ORDER — ATROPINE SULFATE 1 % OP SOLN
2.0000 [drp] | Freq: Four times a day (QID) | OPHTHALMIC | Status: DC
  Administered 2018-09-15 – 2018-10-04 (×75): 2 [drp] via SUBLINGUAL
  Filled 2018-09-15 (×5): qty 2

## 2018-09-15 MED ORDER — CLOZAPINE 100 MG PO TABS
300.0000 mg | ORAL_TABLET | Freq: Every day | ORAL | Status: DC
  Administered 2018-09-15 – 2018-10-03 (×19): 300 mg via ORAL
  Filled 2018-09-15 (×19): qty 3

## 2018-09-15 NOTE — Progress Notes (Signed)
Pt was educated on new diet. [name] Rn

## 2018-09-15 NOTE — Progress Notes (Signed)
Patient remain safe with out any incidents, slept through the night very calm

## 2018-09-15 NOTE — Evaluation (Signed)
Clinical/Bedside Swallow Evaluation Patient Details  Name: Austin Gates MRN: [id number] Date of Birth: [birth year]  Today's Date: 09/15/2018 Time: SLP Start Time (ACUTE ONLY): 54 SLP Stop Time (ACUTE ONLY): 1110 SLP Time Calculation (min) (ACUTE ONLY): 30 min  Past Medical History:  Past Medical History:  Diagnosis Date  . Hypertension   . Intellectual disability   . Obsessive-compulsive disorder   . Schizo-affective schizophrenia [facility])    Past Surgical History:  Past Surgical History:  Procedure Laterality Date  . COLONOSCOPY WITH PROPOFOL N/A 12/28/2017   Procedure: COLONOSCOPY WITH PROPOFOL;  Surgeon: Jonathon Bellows, MD;  Location: [facility] ENDOSCOPY;  Service: Gastroenterology;  Laterality: N/A;  . HYDROCELE EXCISION Bilateral 02/22/2018   Procedure: bilateral HYDROCELECTOMY ADULT;  Surgeon: Cleon Gustin, MD;  Location: [place];  Service: Urology;  Laterality: Bilateral;  . INSERTION OF ILIAC STENT Left 02/22/2018   Procedure: INSERTION LEFT SUPERIOR FEMORAL ARTERY USING 6MM X 5CM VIABHON STENT with mynx device closure on right femoral artery;  Surgeon: Waynetta Sandy, MD;  Location: [place];  Service: Vascular;  Laterality: Left;  . KNEE ARTHROSCOPY    . SCROTAL EXPLORATION N/A 02/22/2018   Procedure: SCROTUM EXPLORATION;  Surgeon: Cleon Gustin, MD;  Location: [place];  Service: Urology;  Laterality: N/A;  . UPPER EXTREMITY ANGIOGRAM Left 02/22/2018   Procedure: left lower EXTREMITY ANGIOGRAM;  Surgeon: Waynetta Sandy, MD;  Location: [facility] OR;  Service: Vascular;  Laterality: Left;   HPI:      Assessment / Plan / Recommendation Clinical Impression  pt presents with a moderate risk for aspiration as evidenced by pt having throat clear and cough with each trial of thin liquid. it should be noted that pt drank thin liuquid via straw and at a very rapid rate. pt is difficult to cue to decrease rate of intake. pt was given multiple trials of solids  with various  textures, pt had no overt ssx aspiration with regular texture peanut butter and graham cracker noted. pt was given trials of nectar thick liquid which once again he drank rapidly and had instances of cough with liquid. pt was administered trials of honey which he had no overt ssx aspiration. pt will require some cing and assistance to compply with current diet order. staff should attempt to thicken all liquids. ST educated RN and staff on diet recommendations and to attempt to cue the pt to eat and drink slowly. pt may benefit from a GI consult due to accessive belching during session. ST recommends a Dys 3 diet with honey thick liquids t this time. ST to continue to follow., SLP Visit Diagnosis: Dysphagia, oropharyngeal phase (R13.12)    Aspiration Risk  Moderate aspiration risk    Diet Recommendation Dysphagia 3 (Mech soft);Honey-thick liquid   Liquid Administration via: Cup Medication Administration: Crushed with puree Supervision: Patient able to self feed Compensations: Slow rate;Small sips/bites;Follow solids with liquid Postural Changes: Seated upright at 90 degrees    Other  Recommendations Recommended Consults: Consider GI evaluation Other Recommendations: Remove water pitcher   Follow up Recommendations        Frequency and Duration min 3x week  2 weeks       Prognosis Prognosis for Safe Diet Advancement: Fair Barriers to Reach Goals: Cognitive deficits;Behavior      Swallow Study   General Date of Onset: 09/15/18 Type of Study: Bedside Swallow Evaluation Diet Prior to this Study: Dysphagia 1 (puree);Thin liquids Temperature Spikes Noted: No Respiratory Status: Room air History of Recent  Intubation: No Behavior/Cognition: Alert;Cooperative;Pleasant mood Oral Cavity Assessment: Within Functional Limits Oral Care Completed by SLP: No Oral Cavity - Dentition: Adequate natural dentition Vision: Functional for self-feeding Self-Feeding Abilities: Able to feed  self Patient Positioning: Upright in chair Baseline Vocal Quality: Normal Volitional Cough: Strong Volitional Swallow: Able to elicit    Oral/Motor/Sensory Function Overall Oral Motor/Sensory Function: Within functional limits   Ice Chips Ice chips: Within functional limits Presentation: Spoon   Thin Liquid Thin Liquid: Impaired Pharyngeal  Phase Impairments: Suspected delayed Swallow;Multiple swallows;Cough - Immediate;Throat Clearing - Immediate    Nectar Thick Nectar Thick Liquid: Impaired Pharyngeal Phase Impairments: Cough - Immediate;Throat Clearing - Immediate   Honey Thick Honey Thick Liquid: Within functional limits Presentation: Cup   Puree Puree: Within functional limits Presentation: Self Fed;Spoon   Solid     Solid: Within functional limits Presentation: Self Fed;Spoon      Austin Gates 09/15/2018,11:32 AM

## 2018-09-15 NOTE — Progress Notes (Signed)
Patient is seen in hall screaming and charging at staff member. Patient refuses to listen to commands, and is difficult to redirect. Patient is seen raising fists towards staff and cursing. Patient is verbally abusive and make multiple threats of physical violence towards staff. Day room is closed and patient is returned to room. Patient continues to be verbally abusive to staff.
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
## 2018-09-15 NOTE — Progress Notes (Signed)
Patient in room, calm, requesting to be taken to the day room to watch TV. Upon assessment, patient is cooperative and contracting for safety. Patient was reminded about unit expectations and he agreed to remain adherent. Currently in the dayroom with sitter and no aggressive behaviors noted. Staff continue to provide support and encouragements.

## 2018-09-15 NOTE — BHH Group Notes (Signed)
LCSW Group Therapy Note   09/15/2018 1:15pm   Type of Therapy and Topic:  Group Therapy:  Trust and Honesty  Participation Level:  Active  Description of Group:    In this group patients will be asked to explore the value of being honest.  Patients will be guided to discuss their thoughts, feelings, and behaviors related to honesty and trusting in others. Patients will process together how trust and honesty relate to forming relationships with peers, family members, and self. Each patient will be challenged to identify and express feelings of being vulnerable. Patients will discuss reasons why people are dishonest and identify alternative outcomes if one was truthful (to self or others). This group will be process-oriented, with patients participating in exploration of their own experiences, giving and receiving support, and processing challenge from other group members.   Therapeutic Goals: 1. Patient will identify why honesty is important to relationships and how honesty overall affects relationships.  2. Patient will identify a situation where they lied or were lied too and the  feelings, thought process, and behaviors surrounding the situation 3. Patient will identify the meaning of being vulnerable, how that feels, and how that correlates to being honest with self and others. 4. Patient will identify situations where they could have told the truth, but instead lied and explain reasons of dishonesty.   Summary of Patient Progress The patient was able to explore the value of being honest.  Patient discussed thoughts, feelings, and behaviors related to honesty and trusting in others. The patient processed together with other group members how trust and honesty relate to forming relationships with peers, family members, and self. Pt actively and appropriately engaged in the group. Patient was able to provide support and validation to other group members. Patient practiced active listening when  interacting with the facilitator and other group members.    Therapeutic Modalities:   Cognitive Behavioral Therapy Solution Focused Therapy Motivational Interviewing Brief Therapy  Camron Essman  CUEBAS-COLON, LCSW 09/15/2018 3:48 PM

## 2018-09-15 NOTE — Plan of Care (Signed)
Continues to be redirected but compliant with treatment. Able to express needs and feelings.

## 2018-09-15 NOTE — Progress Notes (Signed)
[facility] MD Progress Note  09/15/2018 9:53 AM Austin Gates  MRN:  [id number] Subjective:  Patient is a 58 year old male with a past psychiatric history significant for schizoaffective disorder as well as developmental delay with an IQ lower than 70 who was admitted on 09/01/2018 secondary to volatile behavior and disruptive behavior at his group home.  Objective: Patient is seen and examined.  Patient is a 58 year old male with the above-stated past psychiatric history who is seen in follow-up.  He was much calmer yesterday.  There were fewer outburst, and this morning he is quiet and non-agitated at all.  Unfortunately he is drooling more significantly, and that may be related to the increase in the Clozaril.  I had requested speech pathology to see the patient, and have asked the nurses to double check the orders and make sure they went through.  They noted that he has had some choking today, but I think it is probably because of the increased salivation.  Otherwise he remains unchanged.  His blood pressure is stable and his oxygen saturation is at 97% on room air.  He is mildly tachycardic with a rate of 112.  He did sleep 7.25 hours last night.  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 Hypertension   . Intellectual disability   . Obsessive-compulsive disorder   . Schizo-affective schizophrenia [facility])     Past Surgical History:  Procedure Laterality Date  . COLONOSCOPY WITH PROPOFOL N/A 12/28/2017   Procedure: COLONOSCOPY WITH PROPOFOL;  Surgeon: Jonathon Bellows, MD;  Location: [facility] ENDOSCOPY;  Service: Gastroenterology;  Laterality: N/A;  .  HYDROCELE EXCISION Bilateral 02/22/2018   Procedure: bilateral HYDROCELECTOMY ADULT;  Surgeon: Cleon Gustin, MD;  Location: [place];  Service: Urology;  Laterality: Bilateral;  . INSERTION OF ILIAC STENT Left 02/22/2018   Procedure: INSERTION LEFT SUPERIOR FEMORAL ARTERY USING 6MM X 5CM VIABHON STENT with mynx device closure on right femoral artery;  Surgeon: Waynetta Sandy, MD;  Location: [place];  Service: Vascular;  Laterality: Left;  . KNEE ARTHROSCOPY    . SCROTAL EXPLORATION N/A 02/22/2018   Procedure: SCROTUM EXPLORATION;  Surgeon: Cleon Gustin, MD;  Location: [place];  Service: Urology;  Laterality: N/A;  . UPPER EXTREMITY ANGIOGRAM Left 02/22/2018   Procedure: left lower EXTREMITY ANGIOGRAM;  Surgeon: Waynetta Sandy, MD;  Location: [facility] OR;  Service: Vascular;  Laterality: Left;   Family History:  Family History  Problem Relation Age of Onset  . Diabetes Mother    Family Psychiatric  History: See admission H&P Social History:  Social History   Substance and Sexual Activity  Alcohol Use No     Social History   Substance and Sexual Activity  Drug Use No    Social History   Socioeconomic History  . Marital status: Single    Spouse name: Not on file  . Number of children: Not on file  . Years of education: Not on file  . Highest education level: Not on file  Occupational History  . Occupation: Disabled  Social Needs  . Financial resource strain: Not on file  . Food insecurity    Worry: Not on file    Inability: Not on file  .  Transportation needs    Medical: Not on file    Non-medical: Not on file  Tobacco Use  . Smoking status: Former Smoker    Types: Cigarettes  . Smokeless tobacco: Never Used  Substance and Sexual Activity  . Alcohol use: No  . Drug use: No  . Sexual activity: Never  Lifestyle  . Physical activity    Days per week: Not on file    Minutes per session: Not on file  . Stress: Not on file  Relationships  . Social  Herbalist on phone: Not on file    Gets together: Not on file    Attends religious service: Not on file    Active member of club or organization: Not on file    Attends meetings of clubs or organizations: Not on file    Relationship status: Not on file  Other Topics Concern  . Not on file  Social History Narrative   ** Merged History Encounter **       Additional Social History:                         Sleep: Good  Appetite:  Good  Current Medications: Current Facility-Administered Medications  Medication Dose Route Frequency Provider Last Rate Last Dose  . acetaminophen (TYLENOL) tablet 650 mg  650 mg Oral Q6H PRN Patrecia Pour, NP   650 mg at 09/09/18 1253  . alum & mag hydroxide-simeth (MAALOX/MYLANTA) 200-200-20 MG/5ML suspension 30 mL  30 mL Oral Q4H PRN Patrecia Pour, NP   30 mL at 09/11/18 1823  . clopidogrel (PLAVIX) tablet 75 mg  75 mg Oral Daily Patrecia Pour, NP   75 mg at 09/15/18 1245  . cloZAPine (CLOZARIL) tablet 400 mg  400 mg Oral QHS Sharma Covert, MD   400 mg at 09/14/18 2112  . divalproex (DEPAKOTE) DR tablet 750 mg  750 mg Oral BID Sharma Covert, MD   750 mg at 09/15/18 8099  . feeding supplement (ENSURE ENLIVE) (ENSURE ENLIVE) liquid 237 mL  237 mL Oral BID BM Money, [name], [name]   237 mL at 09/14/18 1500  . gabapentin (NEURONTIN) capsule 300 mg  300 mg Oral TID Johnn Hai, MD   300 mg at 09/15/18 0826  . haloperidol (HALDOL) tablet 5 mg  5 mg Oral Q6H PRN Johnn Hai, MD   5 mg at 09/13/18 1206   Or  . haloperidol lactate (HALDOL) injection 10 mg  10 mg Intramuscular Q6H PRN Johnn Hai, MD      . haloperidol (HALDOL) tablet 5 mg  5 mg Oral Q6H PRN Sharma Covert, MD       Or  . haloperidol lactate (HALDOL) injection 10 mg  10 mg Intramuscular Q6H PRN Sharma Covert, MD   10 mg at 09/13/18 1703  . LORazepam (ATIVAN) tablet 1 mg  1 mg Oral Q6H PRN Johnn Hai, MD   1 mg at 09/11/18 2128   Or  . LORazepam  (ATIVAN) injection 2 mg  2 mg Intramuscular Q4H PRN Johnn Hai, MD   2 mg at 09/03/18 0044  . LORazepam (ATIVAN) tablet 1 mg  1 mg Oral BID Johnn Hai, MD   1 mg at 09/15/18 8338  . magnesium hydroxide (MILK OF MAGNESIA) suspension 30 mL  30 mL Oral Daily PRN Patrecia Pour, NP   30 mL at 09/11/18 1136  . pantoprazole (PROTONIX) EC tablet  40 mg  40 mg Oral BID Johnn Hai, MD   40 mg at 09/15/18 0820  . zolpidem (AMBIEN) tablet 5 mg  5 mg Oral QHS [name], [name], MD   5 mg at 09/14/18 2112    Lab Results:  Results for orders placed or performed during the hospital encounter of 08/31/18 (from the past 48 hour(s))  CBC with Differential/Platelet     Status: Abnormal   Collection Time: 09/14/18  8:49 AM  Result Value Ref Range   WBC 7.3 4.0 - 10.5 K/uL   RBC 3.95 (L) 4.22 - 5.81 MIL/uL   Hemoglobin 11.8 (L) 13.0 - 17.0 g/dL   HCT 37.7 (L) 39.0 - 52.0 %   MCV 95.4 80.0 - 100.0 fL   MCH 29.9 26.0 - 34.0 pg   MCHC 31.3 30.0 - 36.0 g/dL   RDW 14.7 11.5 - 15.5 %   Platelets 186 150 - 400 K/uL   nRBC 0.6 (H) 0.0 - 0.2 %   Neutrophils Relative % 81 %   Neutro Abs 5.8 1.7 - 7.7 K/uL   Lymphocytes Relative 11 %   Lymphs Abs 0.8 0.7 - 4.0 K/uL   Monocytes Relative 6 %   Monocytes Absolute 0.4 0.1 - 1.0 K/uL   Eosinophils Relative 1 %   Eosinophils Absolute 0.1 0.0 - 0.5 K/uL   Basophils Relative 0 %   Basophils Absolute 0.0 0.0 - 0.1 K/uL   Immature Granulocytes 1 %   Abs Immature Granulocytes 0.09 (H) 0.00 - 0.07 K/uL    Comment: Performed at [address] [facility], [address] [place], [place] [address]    Blood Alcohol level:  Lab Results  Component Value Date   [facility] <10 08/31/2018   ETH <10 14/43/1540    Metabolic Disorder Labs: Lab Results  Component Value Date   HGBA1C 5.4 09/30/2016   MPG 108.28 09/30/2016   MPG 114 09/17/2016   No results found for: PROLACTIN Lab Results  Component Value Date   CHOL 135 09/30/2016   TRIG 104 02/25/2018   HDL 60 09/30/2016    CHOLHDL 2.3 09/30/2016   VLDL 12 09/30/2016   LDLCALC 63 09/30/2016    Physical Findings: AIMS: Facial and Oral Movements Muscles of Facial Expression: None, normal Lips and Perioral Area: None, normal Jaw: None, normal Tongue: None, normal,Extremity Movements Upper (arms, wrists, hands, fingers): None, normal Lower (legs, knees, ankles, toes): None, normal, Trunk Movements Neck, shoulders, hips: None, normal, Overall Severity Severity of abnormal movements (highest score from questions above): None, normal Incapacitation due to abnormal movements: None, normal Patient's awareness of abnormal movements (rate only patient's report): No Awareness, Dental Status Current problems with teeth and/or dentures?: No Does patient usually wear dentures?: No  CIWA:  CIWA-Ar Total: 2 COWS:  COWS Total Score: 4  Musculoskeletal: Strength & Muscle Tone: within normal limits Gait & Station: normal Patient leans: N/A  Psychiatric Specialty Exam: Physical Exam  Nursing note and vitals reviewed. Constitutional: He appears well-developed.  HENT:  Head: Normocephalic and atraumatic.  Respiratory: Effort normal.  Neurological: He is alert.    ROS  Blood pressure 111/82, pulse (!) 112, temperature 98.6 F (37 C), temperature source Oral, resp. rate 17, height 5\' 11"  (1.803 m), weight 67.1 kg, SpO2 97 %.Body mass index is 20.64 kg/m.  General Appearance: Casual  Eye Contact:  Fair  Speech:  Slow and Slurred  Volume:  Decreased  Mood:  Dysphoric  Affect:  Flat  Thought Process:  Goal Directed  and Descriptions of Associations: Circumstantial  Orientation:  Negative  Thought Content:  Rumination  Suicidal Thoughts:  No  Homicidal Thoughts:  No  Memory:  Immediate;   Poor Recent;   Poor Remote;   Poor  Judgement:  Impaired  Insight:  Lacking  Psychomotor Activity:  Psychomotor Retardation  Concentration:  Concentration: Poor and Attention Span: Poor  Recall:  Poor  Fund of  Knowledge:  Poor  Language:  Poor  Akathisia:  Negative  Handed:  Right  AIMS (if indicated):     Assets:  Desire for Improvement Resilience  ADL's:  Impaired  Cognition:  Impaired,  Moderate  Sleep:  Number of Hours: 6.75     Treatment Plan Summary: Daily contact with patient to assess and evaluate symptoms and progress in treatment, Medication management and Plan : Patient is seen and examined.  Patient is a 59 year old male with the above-stated past psychiatric history who is seen in follow-up.   Diagnosis: #1 schizoaffective disorder, #2 intellectual deficit, #3 peripheral vascular disease, #4 GERD  Patient is seen in follow-up.  He is much less agitated today, but drooling more.  I had asked for a speech pathology assessment yesterday, that is still pending.  The excessive drooling is probably from the Clozaril, so I am going to decrease his dosage back down to 300 mg p.o. nightly.  I am also going to use some atropine eyedrops to try and dry out some of his secretions.  His oxygen saturation appears to be stable at this point, does not look like he is aspirated, but hopefully he will wake up a little bit when we decrease his Clozaril dosage.  I am going to change his diet today.  We will make sure that he has something that would decrease his risk for aspiration.  Otherwise no other changes in his medications at this time.  We will order for a Depakote level either tomorrow morning or Monday morning. 1. Decrease Clozaril to 300 mg p.o. nightly for psychosis. 2. Continue Plavix 75 mg p.o. daily for peripheral and cardiac perfusion. 3. Increase Depakote DR to 750 mg p.o. twice daily for mood stability. 4. Continue Neurontin 300 mg p.o. 3 times daily for chronic pain, mood stability, and agitation. 5. Continue PRN haloperidol either 5 mg p.o. or 10 mg IM for PRN agitation. 6. Continue lorazepam 1 mg p.o. or 2 mg IM PRN agitation. 7. Continue Protonix 40 mg p.o. daily for gastric  protection. 8. Continue Ambien 5 mg p.o. nightly for insomnia. 9. Add atropine eyedrops to attempt to control secretions from Clozaril. 10.  Awaiting speech pathology assessment. 11.  Disposition planning-in progress.  Sharma Covert, MD 09/15/2018, 9:53 AM

## 2018-09-16 NOTE — Plan of Care (Signed)
Patient is exhibiting improvement. Out of wheelchair and ambulates independently. Active in the milieu and cooperative. Mood improved.

## 2018-09-16 NOTE — Progress Notes (Signed)
Patient was in the milieu at the beginning of the shift. Pleasant upon approach. Cooperative and maintaining appropriate behavior. He is not needing to a wheelchair for mobility assistance. Gait is steady. Patient is well groomed and had expresses his needs appropriately. No sign of distress. Currently attending group activities outside. Staff continue to monitor on 1:1 for safety.

## 2018-09-16 NOTE — Progress Notes (Signed)
1:1 Patient Hourly Rounding  1000: Patient is in the dayroom, eating breakfast, with his assigned safety sitter present at his side.  1400: Patient is walking around the unit with his assigned safety sitter.  1800: Patient is in the dayroom, with staff, and other members on the unit, with his assigned safety sitter present at his side.

## 2018-09-16 NOTE — Progress Notes (Signed)
Patient's diet has been changed to honey thick, so dietary informed this writer that Ensure is not compatible with patient's new diet.

## 2018-09-16 NOTE — Progress Notes (Signed)
Patient slept throughout the night and appeared to be comfortable in bed. Woke up for vital signs. Cooperative and pleasant upon approach. Currently sitting in the dayroom with staff and peers. No sign of distress. Safety monitored on 1:1 as ordered.

## 2018-09-16 NOTE — Progress Notes (Signed)
Patient took his bedtime medications then fell asleep. Has been sleeping without any difficulties, sitter at bedside.
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
## 2018-09-16 NOTE — Plan of Care (Signed)
D- Patient alert and oriented. Patient presented in a pleasant mood on assessment continuing to report that he is hungry all the time and always wanting peanut butter and graham crackers. Patient can be logical/coherent and then his speech can become slurred and incomprehensible at times. Patient also was preoccupied with wanting to know the time and when staff was going to take him outside. Patient denied SI, HI, AVH, and pain at the time of assessment. Patient did not endorse any signs/symptoms of depression/anxiety. Patient had no stated goals for today.  A- Scheduled medications administered to patient, per MD orders. Support and encouragement provided.  Routine safety checks conducted every 15 minutes.  Patient informed to notify staff with problems or concerns.  R- No adverse drug reactions noted. Patient contracts for safety at this time. Patient compliant with medications and treatment plan. Patient receptive, calm, and cooperative. Patient interacts well with others on the unit.  Patient remains safe at this time.  Problem: Activity: Goal: Will identify at least one activity in which they can participate Outcome: Progressing   Problem: Coping: Goal: Ability to identify and develop effective coping behavior will improve Outcome: Progressing Goal: Ability to interact with others will improve Outcome: Progressing Goal: Demonstration of participation in decision-making regarding own care will improve Outcome: Progressing Goal: Ability to use eye contact when communicating with others will improve Outcome: Progressing   Problem: Health Behavior/Discharge Planning: Goal: Identification of resources available to assist in meeting health care needs will improve Outcome: Progressing   Problem: Self-Concept: Goal: Will verbalize positive feelings about self Outcome: Progressing   Problem: Education: Goal: Utilization of techniques to improve thought processes will improve Outcome:  Progressing Goal: Knowledge of the prescribed therapeutic regimen will improve Outcome: Progressing   Problem: Activity: Goal: Interest or engagement in leisure activities will improve Outcome: Progressing Goal: Imbalance in normal sleep/wake cycle will improve Outcome: Progressing   Problem: Coping: Goal: Coping ability will improve Outcome: Progressing Goal: Will verbalize feelings Outcome: Progressing   Problem: Health Behavior/Discharge Planning: Goal: Ability to make decisions will improve Outcome: Progressing Goal: Compliance with therapeutic regimen will improve Outcome: Progressing   Problem: Role Relationship: Goal: Will demonstrate positive changes in social behaviors and relationships Outcome: Progressing   Problem: Safety: Goal: Ability to disclose and discuss suicidal ideas will improve Outcome: Progressing Goal: Ability to identify and utilize support systems that promote safety will improve Outcome: Progressing   Problem: Self-Concept: Goal: Will verbalize positive feelings about self Outcome: Progressing Goal: Level of anxiety will decrease Outcome: Progressing   Problem: Education: Goal: Ability to verbalize precipitating factors for violent behavior will improve Outcome: Progressing   Problem: Coping: Goal: Ability to verbalize frustrations and anger appropriately will improve Outcome: Progressing   Problem: Health Behavior/Discharge Planning: Goal: Ability to implement measures to prevent violent behavior in the future will improve Outcome: Progressing   Problem: Safety: Goal: Ability to demonstrate self-control will improve Outcome: Progressing Goal: Ability to redirect hostility and anger into socially appropriate behaviors will improve Outcome: Progressing

## 2018-09-16 NOTE — [facility] Group Notes (Signed)
LCSW Group Therapy Note 09/16/2018 1:15pm  Type of Therapy and Topic: Group Therapy: Feelings Around Returning Home & Establishing a Supportive Framework and Supporting Oneself When Supports Not Available  Participation Level: Active  Description of Group:  Patients first processed thoughts and feelings about upcoming discharge. These included fears of upcoming changes, lack of change, new living environments, judgements and expectations from others and overall stigma of mental health issues. The group then discussed the definition of a supportive framework, what that looks and feels like, and how do to discern it from an unhealthy non-supportive network. The group identified different types of supports as well as what to do when your family/friends are less than helpful or unavailable  Therapeutic Goals  1. Patient will identify one healthy supportive network that they can use at discharge. 2. Patient will identify one factor of a supportive framework and how to tell it from an unhealthy network. 3. Patient able to identify one coping skill to use when they do not have positive supports from others. 4. Patient will demonstrate ability to communicate their needs through discussion and/or role plays.  Summary of Patient Progress:  The patient reported he feels "good." Pt engaged during group session. As patients processed their anxiety about discharge and described healthy supports patient shared he is ready to be discharge. He stated, "they found a group home I am ready to leave." He listed his sister as his main support.  Patients identified at least one self-care tool they were willing to use after discharge.   Therapeutic Modalities Cognitive Behavioral Therapy Motivational Interviewing   [name]  CUEBAS-COLON, LCSW 09/16/2018 9:09 AM

## 2018-09-16 NOTE — Progress Notes (Signed)
[facility] MD Progress Note  09/16/2018 10:41 AM Austin Gates  MRN:  [id number] Subjective:  Patient is a 58 year old male with a past psychiatric history significant for schizoaffective disorder as well as developmental delay with an IQ lower than 70 who was admitted on 09/01/2018 secondary to volatile behavior and disruptive behavior at his group home.  Objective: Patient is seen and examined.  Patient is a 58 year old male with the above-stated past psychiatric history who is seen in follow-up.  He is essentially unchanged.  His Clozaril was decreased yesterday because the hypersalivation.  He does not seem to be drooling is badly today.  He was also given atropine drops for that.  Speech pathology evaluated the patient, and it appeared to be that they this needs some minor changes in the consistency of his diet.  They also recommended a gastroenterology consult for a swallowing study.  He was mildly sedated this morning, but ever since we rounded he has been up walking around.  He is mildly unstable on his feet.  He continues to be on one-to-one.  He is mildly tachycardic with a rate of 112.  His blood pressure is stable, he is afebrile.  His oxygen saturation on room air is 97%.  He slept 7 hours last night.  No new laboratories today.  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 Hypertension   . Intellectual disability   . Obsessive-compulsive disorder   . Schizo-affective schizophrenia [facility])     Past Surgical History:  Procedure Laterality Date  . COLONOSCOPY WITH PROPOFOL N/A 12/28/2017   Procedure: COLONOSCOPY WITH PROPOFOL;  Surgeon: Jonathon Bellows, MD;  Location: [facility] ENDOSCOPY;  Service: Gastroenterology;  Laterality: N/A;  . HYDROCELE EXCISION Bilateral 02/22/2018   Procedure: bilateral HYDROCELECTOMY ADULT;  Surgeon: Cleon Gustin, MD;  Location: [place];  Service: Urology;  Laterality: Bilateral;  . INSERTION OF ILIAC STENT Left 02/22/2018   Procedure: INSERTION LEFT SUPERIOR FEMORAL ARTERY USING 6MM X 5CM VIABHON STENT with mynx device closure on right femoral artery;  Surgeon: Waynetta Sandy, MD;  Location: [place];  Service: Vascular;  Laterality: Left;  . KNEE ARTHROSCOPY    . SCROTAL EXPLORATION N/A 02/22/2018   Procedure: SCROTUM EXPLORATION;  Surgeon: Cleon Gustin, MD;  Location: [place];  Service: Urology;  Laterality: N/A;  . UPPER EXTREMITY ANGIOGRAM Left 02/22/2018   Procedure: left lower EXTREMITY ANGIOGRAM;  Surgeon: Waynetta Sandy, MD;  Location[facility] OR;  Service: Vascular;  Laterality: Left;   Family History:  Family History  Problem Relation Age of Onset  . Diabetes Mother    Family Psychiatric  History: See admission H&P Social History:  Social History   Substance and Sexual Activity  Alcohol Use No     Social History   Substance and Sexual Activity  Drug Use No    Social History   Socioeconomic History  . Marital status: Single    Spouse name: Not on file  . Number of children: Not on file  . Years of education: Not on file  . Highest education level: Not on file  Occupational History  . Occupation: Disabled  Social Needs  . Financial resource strain: Not on file  .  Food insecurity    Worry: Not on file    Inability: Not on file  . Transportation needs    Medical: Not on file    Non-medical: Not on file  Tobacco Use  . Smoking status: Former Smoker    Types: Cigarettes  . Smokeless tobacco: Never Used  Substance and Sexual Activity  . Alcohol use: No  . Drug use: No  . Sexual activity: Never  Lifestyle  . Physical activity    Days per week: Not on file     Minutes per session: Not on file  . Stress: Not on file  Relationships  . Social Herbalist on phone: Not on file    Gets together: Not on file    Attends religious service: Not on file    Active member of club or organization: Not on file    Attends meetings of clubs or organizations: Not on file    Relationship status: Not on file  Other Topics Concern  . Not on file  Social History Narrative   ** Merged History Encounter **       Additional Social History:                         Sleep: Good  Appetite:  Good  Current Medications: Current Facility-Administered Medications  Medication Dose Route Frequency Provider Last Rate Last Dose  . acetaminophen (TYLENOL) tablet 650 mg  650 mg Oral Q6H PRN Patrecia Pour, NP   650 mg at 09/09/18 1253  . alum & mag hydroxide-simeth (MAALOX/MYLANTA) 200-200-20 MG/5ML suspension 30 mL  30 mL Oral Q4H PRN Patrecia Pour, NP   30 mL at 09/11/18 1823  . atropine 1 % ophthalmic solution 2 drop  2 drop Sublingual QID Sharma Covert, MD   2 drop at 09/16/18 0954  . clopidogrel (PLAVIX) tablet 75 mg  75 mg Oral Daily Patrecia Pour, NP   75 mg at 09/16/18 0955  . cloZAPine (CLOZARIL) tablet 300 mg  300 mg Oral QHS Sharma Covert, MD   300 mg at 09/15/18 2205  . divalproex (DEPAKOTE) DR tablet 750 mg  750 mg Oral BID Sharma Covert, MD   750 mg at 09/16/18 0954  . feeding supplement (ENSURE ENLIVE) (ENSURE ENLIVE) liquid 237 mL  237 mL Oral BID BM Money, Lowry Ram, [name]   237 mL at 09/15/18 1448  . gabapentin (NEURONTIN) capsule 300 mg  300 mg Oral TID Johnn Hai, MD   300 mg at 09/16/18 0954  . haloperidol (HALDOL) tablet 5 mg  5 mg Oral Q6H PRN Johnn Hai, MD   5 mg at 09/15/18 1846   Or  . haloperidol lactate (HALDOL) injection 10 mg  10 mg Intramuscular Q6H PRN Johnn Hai, MD      . haloperidol (HALDOL) tablet 5 mg  5 mg Oral Q6H PRN Sharma Covert, MD       Or  . haloperidol lactate (HALDOL) injection  10 mg  10 mg Intramuscular Q6H PRN Sharma Covert, MD   10 mg at 09/13/18 1703  . LORazepam (ATIVAN) tablet 1 mg  1 mg Oral Q6H PRN Johnn Hai, MD   1 mg at 09/15/18 1846   Or  . LORazepam (ATIVAN) injection 2 mg  2 mg Intramuscular Q4H PRN Johnn Hai, MD   2 mg at 09/03/18 0044  . LORazepam (ATIVAN) tablet 1 mg  1 mg Oral BID  Johnn Hai, MD   1 mg at 09/16/18 [phone]  . magnesium hydroxide (MILK OF MAGNESIA) suspension 30 mL  30 mL Oral Daily PRN Patrecia Pour, NP   30 mL at 09/11/18 1136  . pantoprazole (PROTONIX) EC tablet 40 mg  40 mg Oral BID Johnn Hai, MD   40 mg at 09/16/18 0954  . zolpidem (AMBIEN) tablet 5 mg  5 mg Oral QHS [name], [name], MD   5 mg at 09/15/18 2205    Lab Results: No results found for this or any previous visit (from the past 48 hour(s)).  Blood Alcohol level:  Lab Results  Component Value Date   ETH <10 08/31/2018   ETH <10 78/93/8101    Metabolic Disorder Labs: Lab Results  Component Value Date   HGBA1C 5.4 09/30/2016   MPG 108.28 09/30/2016   MPG 114 09/17/2016   No results found for: PROLACTIN Lab Results  Component Value Date   CHOL 135 09/30/2016   TRIG 104 02/25/2018   HDL 60 09/30/2016   CHOLHDL 2.3 09/30/2016   VLDL 12 09/30/2016   LDLCALC 63 09/30/2016    Physical Findings: AIMS: Facial and Oral Movements Muscles of Facial Expression: None, normal Lips and Perioral Area: None, normal Jaw: None, normal Tongue: None, normal,Extremity Movements Upper (arms, wrists, hands, fingers): None, normal Lower (legs, knees, ankles, toes): None, normal, Trunk Movements Neck, shoulders, hips: None, normal, Overall Severity Severity of abnormal movements (highest score from questions above): None, normal Incapacitation due to abnormal movements: None, normal Patient's awareness of abnormal movements (rate only patient's report): No Awareness, Dental Status Current problems with teeth and/or dentures?: No Does patient usually wear  dentures?: No  CIWA:  CIWA-Ar Total: 2 COWS:  COWS Total Score: 4  Musculoskeletal: Strength & Muscle Tone: decreased Gait & Station: unsteady Patient leans: N/A  Psychiatric Specialty Exam: Physical Exam  Nursing note and vitals reviewed. Constitutional: He appears well-developed and well-nourished.  HENT:  Head: Normocephalic and atraumatic.  Respiratory: Effort normal.  Neurological: He is alert.    ROS  Blood pressure 111/82, pulse (!) 112, temperature 98.6 F (37 C), temperature source Oral, resp. rate 17, height 5\' 11"  (1.803 m), weight 67.1 kg, SpO2 97 %.Body mass index is 20.64 kg/m.  General Appearance: Disheveled  Eye Contact:  Fair  Speech:  Garbled and Slurred  Volume:  Decreased  Mood:  Dysphoric  Affect:  Flat  Thought Process:  Goal Directed and Descriptions of Associations: Circumstantial  Orientation:  Negative  Thought Content:  Rumination  Suicidal Thoughts:  No  Homicidal Thoughts:  No  Memory:  Immediate;   Poor Recent;   Poor Remote;   Poor  Judgement:  Impaired  Insight:  Lacking  Psychomotor Activity:  Increased  Concentration:  Concentration: Poor and Attention Span: Poor  Recall:  Poor  Fund of Knowledge:  Poor  Language:  Poor  Akathisia:  Negative  Handed:  Right  AIMS (if indicated):     Assets:  Desire for Improvement Resilience  ADL's:  Impaired  Cognition:  Impaired,  Moderate  Sleep:  Number of Hours: 7     Treatment Plan Summary: Daily contact with patient to assess and evaluate symptoms and progress in treatment, Medication management and Plan : Patient is seen and examined.  Patient is a 58 year old male with the above-stated past psychiatric history who is seen in follow-up.    Diagnosis: #1 schizoaffective disorder, #2 intellectual deficit, #3 peripheral vascular disease, #4 GERD  Patient  is seen in follow-up.  He is essentially unchanged.  His drooling seems to be under better control.  Speech pathology saw the patient  and recommended a gastroenterology consult for possible dysphasia.  We will order that today.  I will order a Depakote level for the a.m.  His CBC was done on Friday.  No other changes to his medications.  We are still awaiting placement. 1. Continue Clozaril 300 mg p.o. nightly for psychosis. 2. Continue Plavix 75 mg p.o. daily for peripheral and cardiac perfusion. 3. Continue DepakoteDRto 750 mg p.o. twice daily for mood stability. 4. Continue Neurontin 300 mg p.o. 3 times daily for chronic pain, mood stability, and agitation. 5. Continue PRN haloperidol either 5 mg p.o. or 10 mg IM for PRN agitation. 6. Continue lorazepam 1 mg p.o. or 2 mg IM PRN agitation. 7. Continue Protonix 40 mg p.o. daily for gastric protection. 8. Continue Ambien 5 mg p.o. nightly for insomnia. 9. Add atropine eyedrops to attempt to control secretions from Clozaril. 10.    Order gastroenterology consult for dysphasia.Marland Kitchen 11.    Get Depakote level and liver function enzymes in a.m. tomorrow. 12.  Disposition planning-in progress.  Sharma Covert, MD 09/16/2018, 10:41 AM

## 2018-09-17 LAB — HEPATIC FUNCTION PANEL
ALT: 13 U/L (ref 0–44)
AST: 14 U/L — ABNORMAL LOW (ref 15–41)
Albumin: 3.2 g/dL — ABNORMAL LOW (ref 3.5–5.0)
Alkaline Phosphatase: 70 U/L (ref 38–126)
Bilirubin, Direct: <0.1 mg/dL (ref 0.0–0.2)
Total Bilirubin: 0.6 mg/dL (ref 0.3–1.2)
Total Protein: 6.4 g/dL — ABNORMAL LOW (ref 6.5–8.1)

## 2018-09-17 LAB — VALPROIC ACID LEVEL: Valproic Acid Lvl: 34 ug/mL — ABNORMAL LOW (ref 50.0–100.0)

## 2018-09-17 MED ORDER — ADULT MULTIVITAMIN W/MINERALS CH
1.0000 | ORAL_TABLET | Freq: Every day | ORAL | Status: DC
  Administered 2018-09-18 – 2018-10-04 (×17): 1 via ORAL
  Filled 2018-09-17 (×17): qty 1

## 2018-09-17 NOTE — Progress Notes (Signed)
Initial Nutrition Assessment  DOCUMENTATION CODES:   Not applicable  INTERVENTION:   Magic cup TID with meals, each supplement provides 290 kcal and 9 grams of protein  Honey Thick Mighty Shake TID, each supplement provides 200kcal and 7g protein  MVI daily    NUTRITION DIAGNOSIS:   Inadequate oral intake related to dysphagia as evidenced by other (comment)(per chart review).  GOAL:   Patient will meet greater than or equal to 90% of their needs  MONITOR:   PO intake, Supplement acceptance  REASON FOR ASSESSMENT:   Consult Assessment of nutrition requirement/status  ASSESSMENT:   58 y.o. male admitted for disruptive behavior at his group home. He has a history of chronic dysphagia, schizoaffective schizophrenia and intellectual disability.   RD familiar with this patient from a previous admit ~8-9 months ago where patient was on TPN for dysphagia. It was recommended that patient have PEG tube placement at that time but family declined. Pt was diagnosed with severe malnutrition during that admit. Pt seen by SLP this admit and placed on a dysphagia 3/ honey thick diet. RD will discontinue Ensure as this is not honey thick and add Magic Cups and Mighty shakes to pt's meal trays. (mighty shakes will have to be ordered and will not be in until 7/29). Per chart, pt appears fairly weight stable since his last admit. Suspect pt with continued malnutrition but unable to diagnose at this time as NFPE cannot be performed.   Medications reviewed and include: plavix, protonix   Labs reviewed:   Diet Order:   Diet Order            DIET DYS 3 Room service appropriate? Yes; Fluid consistency: Honey Thick; Fluid restriction: Other (see comments)  Diet effective now             EDUCATION NEEDS:   No education needs have been identified at this time  Skin:  Skin Assessment: Reviewed RN Assessment  Last BM:  7/27  Height:   Ht Readings from Last 1 Encounters:  08/31/18 5\' 11"   (1.803 m)    Weight:   Wt Readings from Last 1 Encounters:  08/31/18 67.1 kg    Ideal Body Weight:  78.18 kg  BMI:  Body mass index is 20.64 kg/m.  Estimated Nutritional Needs:   Kcal:  2000-2300kcal/day  Protein:  100-115g/day  Fluid:  >2L/day  Koleen Distance MS, RD, LDN Pager #- [phone] Office#- [phone] After Hours Pager: [phone]

## 2018-09-17 NOTE — Plan of Care (Signed)
D- Patient alert and oriented. Patient presents in a pleasant mood on assessment stating that he slept good last night and had no complaints. Patient denies SI, HI, AVH, and pain at this time. Patient also denies any signs/symptoms of depression/anxiety stating that overall "I feel good". Patient's goal for today is "being discharged".  A- Scheduled medications administered to patient, per MD orders. Support and encouragement provided.  Routine safety checks conducted every 15 minutes.  Patient informed to notify staff with problems or concerns.  R- No adverse drug reactions noted. Patient contracts for safety at this time. Patient compliant with medications and treatment plan. Patient receptive, calm, and cooperative. Patient interacts well with others on the unit.  Patient remains safe at this time.  Problem: Activity: Goal: Will identify at least one activity in which they can participate 09/17/2018 1000 by Lyda Kalata, RN Outcome: Progressing 09/17/2018 1000 by Lyda Kalata, RN Outcome: Progressing   Problem: Coping: Goal: Ability to identify and develop effective coping behavior will improve 09/17/2018 1000 by Lyda Kalata, RN Outcome: Progressing 09/17/2018 1000 by Lyda Kalata, RN Outcome: Progressing Goal: Ability to interact with others will improve 09/17/2018 1000 by Lyda Kalata, RN Outcome: Progressing 09/17/2018 1000 by Lyda Kalata, RN Outcome: Progressing Goal: Demonstration of participation in decision-making regarding own care will improve 09/17/2018 1000 by Lyda Kalata, RN Outcome: Progressing 09/17/2018 1000 by Lyda Kalata, RN Outcome: Progressing Goal: Ability to use eye contact when communicating with others will improve 09/17/2018 1000 by Lyda Kalata, RN Outcome: Progressing 09/17/2018 1000 by Lyda Kalata, RN Outcome: Progressing   Problem: Health Behavior/Discharge Planning: Goal: Identification of  resources available to assist in meeting health care needs will improve 09/17/2018 1000 by Lyda Kalata, RN Outcome: Progressing 09/17/2018 1000 by Lyda Kalata, RN Outcome: Progressing   Problem: Self-Concept: Goal: Will verbalize positive feelings about self 09/17/2018 1000 by Lyda Kalata, RN Outcome: Progressing 09/17/2018 1000 by Lyda Kalata, RN Outcome: Progressing   Problem: Education: Goal: Utilization of techniques to improve thought processes will improve 09/17/2018 1000 by Lyda Kalata, RN Outcome: Progressing 09/17/2018 1000 by Lyda Kalata, RN Outcome: Progressing Goal: Knowledge of the prescribed therapeutic regimen will improve 09/17/2018 1000 by Lyda Kalata, RN Outcome: Progressing 09/17/2018 1000 by Lyda Kalata, RN Outcome: Progressing   Problem: Activity: Goal: Interest or engagement in leisure activities will improve 09/17/2018 1000 by Lyda Kalata, RN Outcome: Progressing 09/17/2018 1000 by Lyda Kalata, RN Outcome: Progressing Goal: Imbalance in normal sleep/wake cycle will improve 09/17/2018 1000 by Lyda Kalata, RN Outcome: Progressing 09/17/2018 1000 by Lyda Kalata, RN Outcome: Progressing   Problem: Coping: Goal: Coping ability will improve 09/17/2018 1000 by Lyda Kalata, RN Outcome: Progressing 09/17/2018 1000 by Lyda Kalata, RN Outcome: Progressing Goal: Will verbalize feelings 09/17/2018 1000 by Lyda Kalata, RN Outcome: Progressing 09/17/2018 1000 by Lyda Kalata, RN Outcome: Progressing   Problem: Health Behavior/Discharge Planning: Goal: Ability to make decisions will improve 09/17/2018 1000 by Lyda Kalata, RN Outcome: Progressing 09/17/2018 1000 by Lyda Kalata, RN Outcome: Progressing Goal: Compliance with therapeutic regimen will improve 09/17/2018 1000 by Lyda Kalata, RN Outcome: Progressing 09/17/2018 1000 by Lyda Kalata, RN Outcome: Progressing   Problem: Role Relationship: Goal: Will demonstrate positive changes in social behaviors and relationships 09/17/2018 1000 by Lyda Kalata, RN Outcome: Progressing 09/17/2018 1000 by Lyda Kalata, RN Outcome: Progressing   Problem: Safety: Goal: Ability to disclose and discuss suicidal ideas will improve 09/17/2018 1000 by Lyda Kalata, RN Outcome: Progressing 09/17/2018 1000 by Lyda Kalata, RN Outcome: Progressing Goal:  Ability to identify and utilize support systems that promote safety will improve 09/17/2018 1000 by Lyda Kalata, RN Outcome: Progressing 09/17/2018 1000 by Lyda Kalata, RN Outcome: Progressing   Problem: Self-Concept: Goal: Will verbalize positive feelings about self 09/17/2018 1000 by Lyda Kalata, RN Outcome: Progressing 09/17/2018 1000 by Lyda Kalata, RN Outcome: Progressing Goal: Level of anxiety will decrease 09/17/2018 1000 by Lyda Kalata, RN Outcome: Progressing 09/17/2018 1000 by Lyda Kalata, RN Outcome: Progressing   Problem: Education: Goal: Ability to verbalize precipitating factors for violent behavior will improve 09/17/2018 1000 by Lyda Kalata, RN Outcome: Progressing 09/17/2018 1000 by Lyda Kalata, RN Outcome: Progressing   Problem: Coping: Goal: Ability to verbalize frustrations and anger appropriately will improve 09/17/2018 1000 by Lyda Kalata, RN Outcome: Progressing 09/17/2018 1000 by Lyda Kalata, RN Outcome: Progressing   Problem: Health Behavior/Discharge Planning: Goal: Ability to implement measures to prevent violent behavior in the future will improve 09/17/2018 1000 by Lyda Kalata, RN Outcome: Progressing 09/17/2018 1000 by Lyda Kalata, RN Outcome: Progressing   Problem: Safety: Goal: Ability to demonstrate self-control will improve 09/17/2018 1000 by Lyda Kalata, RN Outcome:  Progressing 09/17/2018 1000 by Lyda Kalata, RN Outcome: Progressing Goal: Ability to redirect hostility and anger into socially appropriate behaviors will improve 09/17/2018 1000 by Lyda Kalata, RN Outcome: Progressing 09/17/2018 1000 by Lyda Kalata, RN Outcome: Progressing
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
## 2018-09-17 NOTE — Progress Notes (Signed)
1:1 Patient Hourly Rounding  1000: Patient is in the dayroom, with staff and other members on the unit, with his assigned safety sitter present at his side.  1400: Patient is down in the dayroom with staff and the other members on the unit with his assigned safety sitter present at his side. Patient's 1:1 order has been discontinued and has been placed on continuous observation.

## 2018-09-17 NOTE — BHH Counselor (Signed)
CSW faxed over "Exception" form to [place], the patient's LME.  CSW received confirmationt hat the fax was successful.  Assunta Curtis, MSW, LCSW 09/17/2018 4:04 PM

## 2018-09-17 NOTE — Progress Notes (Signed)
Patient slept throughout the night. He remains asleep, sitter at bedside. No sign of discomfort.

## 2018-09-17 NOTE — BHH Group Notes (Signed)
Overcoming Obstacles  09/17/2018 1PM  Type of Therapy and Topic:  Group Therapy:  Overcoming Obstacles  Participation Level:  None    Description of Group:    In this group patients will be encouraged to explore what they see as obstacles to their own wellness and recovery. They will be guided to discuss their thoughts, feelings, and behaviors related to these obstacles. The group will process together ways to cope with barriers, with attention given to specific choices patients can make. Each patient will be challenged to identify changes they are motivated to make in order to overcome their obstacles. This group will be process-oriented, with patients participating in exploration of their own experiences as well as giving and receiving support and challenge from other group members.   Therapeutic Goals: 1. Patient will identify personal and current obstacles as they relate to admission. 2. Patient will identify barriers that currently interfere with their wellness or overcoming obstacles.  3. Patient will identify feelings, thought process and behaviors related to these barriers. 4. Patient will identify two changes they are willing to make to overcome these obstacles:      Summary of Patient Progress Pt attended group but no input provided. Pt required some redirection when he interrupted while other members were speaking.    Therapeutic Modalities:   Cognitive Behavioral Therapy Solution Focused Therapy Motivational Interviewing Relapse Prevention Therapy    Sanjuana Kava, MSW, LCSW 09/17/2018 2:09 PM

## 2018-09-17 NOTE — BHH Counselor (Signed)
CSW completed phone screening for the patient with [facility].  CSW inquired about the exception form. CSW was informed that LME will send back to CSW and CSW will need to send to [place] informed that she will follow once she has received it.   Assunta Curtis, MSW, LCSW 09/17/2018 4:15 PM

## 2018-09-17 NOTE — Tx Team (Signed)
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 acetaminophen (TYLENOL) tablet 650 mg  650 mg Oral Q6H PRN Patrecia Pour, NP   650 mg at 09/09/18 1253  . alum & mag hydroxide-simeth (MAALOX/MYLANTA) 200-200-20 MG/5ML suspension 30 mL  30 mL Oral Q4H PRN Patrecia Pour, NP   30 mL at 09/11/18 1823  . atropine 1 % ophthalmic solution 2 drop  2 drop Sublingual QID Sharma Covert, MD   2 drop at 09/17/18 [phone]  . clopidogrel (PLAVIX) tablet 75 mg  75 mg Oral Daily Patrecia Pour, NP   75 mg at 09/17/18 1025  . cloZAPine (CLOZARIL) tablet 300 mg  300 mg Oral QHS Sharma Covert, MD   300 mg at 09/16/18 2111  . divalproex (DEPAKOTE) DR tablet 750 mg  750 mg Oral BID Sharma Covert, MD   750 mg at 09/17/18 0836  . feeding supplement (ENSURE ENLIVE) (ENSURE ENLIVE) liquid 237 mL  237 mL Oral BID BM Money, [name], [name]   237 mL at 09/16/18 1028  . gabapentin (NEURONTIN) capsule 300 mg  300 mg Oral TID Johnn Hai, MD   300 mg at 09/17/18 0837  . haloperidol (HALDOL) tablet 5 mg  5 mg Oral Q6H PRN Johnn Hai, MD   5 mg at 09/15/18 1846   Or  . haloperidol lactate (HALDOL) injection 10 mg  10 mg Intramuscular Q6H PRN Johnn Hai, MD      . haloperidol (HALDOL) tablet 5 mg  5 mg Oral Q6H PRN Sharma Covert, MD       Or  . haloperidol lactate (HALDOL) injection 10 mg  10 mg Intramuscular Q6H PRN Sharma Covert, MD   10 mg at 09/13/18 1703  . LORazepam (ATIVAN) tablet 1 mg  1 mg Oral Q6H PRN Johnn Hai, MD   1 mg at  09/15/18 1846   Or  . LORazepam (ATIVAN) injection 2 mg  2 mg Intramuscular Q4H PRN Johnn Hai, MD   2 mg at 09/03/18 0044  . LORazepam (ATIVAN) tablet 1 mg  1 mg Oral BID Johnn Hai, MD   1 mg at 09/17/18 0836  . magnesium hydroxide (MILK OF MAGNESIA) suspension 30 mL  30 mL Oral Daily PRN Patrecia Pour, NP   30 mL at 09/11/18 1136  . pantoprazole (PROTONIX) EC tablet 40 mg  40 mg Oral BID Johnn Hai, MD   40 mg at 09/17/18 0836  . zolpidem (AMBIEN) tablet 5 mg  5 mg Oral QHS [name], [name], MD   5 mg at 09/16/18 2111   PTA Medications: Medications Prior to Admission  Medication Sig Dispense Refill Last Dose  . acetaminophen (TYLENOL) 500 MG tablet Take 500 mg by mouth every 6 (six) hours as needed for mild pain.     . benztropine (COGENTIN) 1 MG tablet Take 1 mg by mouth 2 (two) times daily.     . clopidogrel (PLAVIX) 75 MG tablet Take 1 tablet (75 mg total) by mouth daily. 30 tablet 0   . cloZAPine (CLOZARIL) 100 MG tablet Take 1.5 tablets (150 mg total) by mouth  3 (three) times daily. At 8 AM, 5PM, 8PM per Dr. Darleene Cleaver (Patient taking differently: Take 100-200 mg by mouth 3 (three) times daily. One tablet (100 mg) At 8 AM, and 2PM, two tablets (200 mg) at bedtime per Dr. Darleene Cleaver)     . clozapine (CLOZARIL) 50 MG tablet Take 50 mg by mouth 2 (two) times daily. At 8 am and 2 pm     . diphenhydrAMINE (BENADRYL) 50 MG capsule Take 50 mg by mouth every 6 (six) hours as needed for allergies (sedation).      . haloperidol (HALDOL) 5 MG tablet Take 5 mg by mouth daily as needed (severe agitation).      . LORazepam (ATIVAN) 1 MG tablet Take 1 mg by mouth daily as needed (severe agitation).      . Maltodextrin-Xanthan Gum (RESOURCE THICKENUP CLEAR) POWD Take 120 g by mouth as needed. (Patient not taking: Reported on 04/07/2018) 1 Can 1   . traZODone (DESYREL) 100 MG tablet Take 100 mg by mouth at bedtime.     . [DISCONTINUED] divalproex (DEPAKOTE) 500 MG DR tablet Take 1 tablet (500 mg total)  by mouth 2 (two) times daily. 30 tablet 0     Patient Stressors: Financial difficulties Health problems Medication change or noncompliance  Patient Strengths: Motivation for treatment/growth Special hobby/interest Supportive family/friends  Treatment Modalities: Medication Management, Group therapy, Case management,  1 to 1 session with clinician, Psychoeducation, Recreational therapy.   Physician Treatment Plan for Primary Diagnosis: Schizoaffective disorder, bipolar type ([place]) Long Term Goal(s): Improvement in symptoms so as ready for discharge Improvement in symptoms so as ready for discharge   Short Term Goals: Ability to identify and develop effective coping behaviors will improve Ability to maintain clinical measurements within normal limits will improve Compliance with prescribed medications will improve Ability to verbalize feelings will improve Ability to disclose and discuss suicidal ideas Ability to demonstrate self-control will improve Ability to identify and develop effective coping behaviors will improve  Medication Management: Evaluate patient's response, side effects, and tolerance of medication regimen.  Therapeutic Interventions: 1 to 1 sessions, Unit Group sessions and Medication administration.  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   Protein-calorie malnutrition, severe  Long Term Goal(s): Improvement in symptoms so as ready for discharge Improvement in symptoms so as ready for discharge   Short Term Goals: Ability to identify and develop effective coping behaviors will improve Ability to maintain clinical measurements within normal limits will improve Compliance with prescribed medications will improve Ability to verbalize feelings will improve Ability to disclose and discuss  suicidal ideas Ability to demonstrate self-control will improve Ability to identify and develop effective coping behaviors will improve     Medication Management: Evaluate patient's response, side effects, and tolerance of medication regimen.  Therapeutic Interventions: 1 to 1 sessions, Unit Group sessions and Medication administration.  Evaluation of Outcomes: Progressing   RN Treatment Plan for Primary Diagnosis: Schizoaffective disorder, bipolar type ([place]) Long Term Goal(s): Knowledge of disease and therapeutic regimen to maintain health will improve  Short Term Goals: Ability to demonstrate self-control, Ability to identify and develop effective coping behaviors will improve and Compliance with prescribed medications will improve  Medication Management: RN will administer medications as ordered by provider, will assess and evaluate patient's response and provide education to patient for prescribed medication. RN will report any adverse and/or side effects  to prescribing provider.  Therapeutic Interventions: 1 on 1 counseling sessions, Psychoeducation, Medication administration, Evaluate responses to treatment, Monitor vital signs and CBGs as ordered, Perform/monitor CIWA, COWS, AIMS and Fall Risk screenings as ordered, Perform wound care treatments as ordered.  Evaluation of Outcomes: Progressing   LCSW Treatment Plan for Primary Diagnosis: Schizoaffective disorder, bipolar type ([place]) Long Term Goal(s): Safe transition to appropriate next level of care at discharge, Engage patient in therapeutic group addressing interpersonal concerns.  Short Term Goals: Engage patient in aftercare planning with referrals and resources  Therapeutic Interventions: Assess for all discharge needs, 1 to 1 time with Social worker, Explore available resources and support systems, Assess for adequacy in community support network, Educate family and significant other(s) on suicide prevention, Complete  Psychosocial Assessment, Interpersonal group therapy.  Evaluation of Outcomes: Progressing   Progress in Treatment: Attending groups: Yes. Participating in groups: Yes. Taking medication as prescribed: Yes. Toleration medication: Yes. Family/Significant other contact made: Yes, individual(s) contacted:  [name], legal guardian Patient understands diagnosis: No. Discussing patient identified problems/goals with staff: No. Medical problems stabilized or resolved: Yes. Denies suicidal/homicidal ideation: Yes. Issues/concerns per patient self-inventory: No. Other: NA  New problem(s) identified: No, Describe:  none reported  New Short Term/Long Term Goal(s): Pt did not attend today's meeting. Nurse Tech reports the pt is sedated and resting in his room. Update 09/07/18: medication management for mood stabilization; development of comprehensive mental wellness plan.  Patient Goals:  Goal not obtained at this time. Update 09/07/18:  Patient reports goal is to "go to a nursing home".      Discharge Plan or Barriers: Pt will follow up at [facility] in [place] where he is an established patient.  Update 09/07/18:  Patient has an aftercare appointment scheduled with [facility] in [place].  Patient remains on the unit to assist with Care Coordinator and legal guardian in finding placement for the patient as the guardian has declined for the patient to return to his previous group home. Patient is at baseline for care. Patient has tentatively been approved for [facility] and placement was scheduled for 7/20, however, that may not happen.  Update 7/22:  Patient remains on the unit awaiting placement. Placement at [facility] was not successful and they denied the patient due to concerns that patient was high needs.  Care Coordinator continues to look for placement.  Patient has become aggressive with hospital staff at this time. Update 09/17/18:  Patient remains on the  unit awaiting placement. Patient has had one virtual meeting with a possible placement.  CSW is awaiting to here about that.  CSW and Care Coordinator are in the process of scheduling another interview with another group home.   Reason for Continuation of Hospitalization: Medication stabilization  Estimated Length of Stay: TBD  Recreational Therapy: Patient: N/A Patient Goal: Patient will engage in groups without prompting or encouragement from LRT x3 group sessions within 5 recreation therapy group sessions   Attendees: Patient: 09/17/2018 9:02 AM  Physician: Dr. Weber Cooks, MD 09/17/2018 9:02 AM  Nursing:  09/17/2018 9:02 AM  RN Care Manager: 09/17/2018 9:02 AM  Social Worker:  Assunta Curtis LCSW 09/17/2018 9:02 AM  Recreational Therapist:  09/17/2018 9:02 AM  Other:  09/17/2018 9:02 AM  Other:  09/17/2018 9:02 AM  Other: 09/17/2018 9:02 AM    Scribe for Treatment Team: [name], LCSW 09/17/2018 9:02 AM

## 2018-09-17 NOTE — BHH Counselor (Signed)
CSW has sent referral for [facility].  CSW received confirmation the fax was successful.  Assunta Curtis, MSW, LCSW 09/17/2018 2:29 PM

## 2018-09-17 NOTE — Plan of Care (Signed)
Patient is off 1&1 , and has being under close observations by staff for safety and fall precaution, and has been redirected appropriately, no falls has accused, this shift,  patient took his medication without issues , patient is in bed resting comfortably denies any SI/HI/AVH, has not voiced any concerns only requiring 15 minutes safety check and frequent observations by saffs, no distress. Problem: Activity: Goal: Will identify at least one activity in which they can participate Outcome: Progressing   Problem: Coping: Goal: Ability to identify and develop effective coping behavior will improve Outcome: Progressing Goal: Ability to interact with others will improve Outcome: Progressing Goal: Demonstration of participation in decision-making regarding own care will improve Outcome: Progressing Goal: Ability to use eye contact when communicating with others will improve Outcome: Progressing   Problem: Health Behavior/Discharge Planning: Goal: Identification of resources available to assist in meeting health care needs will improve Outcome: Progressing   Problem: Self-Concept: Goal: Will verbalize positive feelings about self Outcome: Progressing   Problem: Education: Goal: Utilization of techniques to improve thought processes will improve Outcome: Progressing Goal: Knowledge of the prescribed therapeutic regimen will improve Outcome: Progressing   Problem: Activity: Goal: Interest or engagement in leisure activities will improve Outcome: Progressing Goal: Imbalance in normal sleep/wake cycle will improve Outcome: Progressing   Problem: Coping: Goal: Coping ability will improve Outcome: Progressing Goal: Will verbalize feelings Outcome: Progressing   Problem: Health Behavior/Discharge Planning: Goal: Ability to make decisions will improve Outcome: Progressing Goal: Compliance with therapeutic regimen will improve Outcome: Progressing   Problem: Role Relationship: Goal:  Will demonstrate positive changes in social behaviors and relationships Outcome: Progressing   Problem: Safety: Goal: Ability to disclose and discuss suicidal ideas will improve Outcome: Progressing Goal: Ability to identify and utilize support systems that promote safety will improve Outcome: Progressing   Problem: Self-Concept: Goal: Will verbalize positive feelings about self Outcome: Progressing Goal: Level of anxiety will decrease Outcome: Progressing   Problem: Education: Goal: Ability to verbalize precipitating factors for violent behavior will improve Outcome: Progressing   Problem: Coping: Goal: Ability to verbalize frustrations and anger appropriately will improve Outcome: Progressing   Problem: Health Behavior/Discharge Planning: Goal: Ability to implement measures to prevent violent behavior in the future will improve Outcome: Progressing   Problem: Safety: Goal: Ability to demonstrate self-control will improve Outcome: Progressing Goal: Ability to redirect hostility and anger into socially appropriate behaviors will improve Outcome: Progressing
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
## 2018-09-17 NOTE — Progress Notes (Signed)
Patient is in the dayroom with staff and other members on the unit.

## 2018-09-18 NOTE — Progress Notes (Signed)
Austin Gates came to meet with pt and interview pt for potential group home placement. [name] asked CSW about pts behaviors and whether or not pt is able to be redirected. [name] inquired about pts medications and what pt will be discharged with. [name] reported he would need a list of medications that pt is on and reported that he would discuss pt with his QP and reach back out to [place] team within a few days to determine if he will take pt.    [name], MSW, LCSW Clinical Social Work 09/18/2018 2:43 PM

## 2018-09-18 NOTE — BHH Counselor (Signed)
CSW checked with [name] at [facility] to determine if any additional information was needed. CSW was informed that none was needed.  Assunta Curtis, MSW, LCSW 09/18/2018 2:43 PM

## 2018-09-18 NOTE — BHH Counselor (Signed)
CSW contacted [facility], [facility] and [facility] to identify any beds.  Patient was infomred that none of the places had beds.    CSW resubmitted paperwork to [facility].  CSW received fax confirmation.   Assunta Curtis, MSW, LCSW 09/18/2018 1:00 PM

## 2018-09-18 NOTE — Progress Notes (Signed)
  Continuous Observation Hourly Rounding:  0800: Patient is in his room.  0900: Patient is in the dayroom, not interacting with anyone.  1000: Patient is in recreational therapy group, in the craft room.  1100: Patient is in the medication room with this writer receiving his afternoon medication.  1200: Patient is in the dayroom watching television, not interacting with any one else at this time.  1300: Patient is in the community room, in an interview with a potential group home staff member.  1400: Patient is outside in the courtyard with staff and the other members on the unit  1500: Patient is in the dayroom, having something to drink, not bothering anything nor anybody.  1600: Patient is in the dayroom, not interacting with anyone, watching television.  1700: Patient is on the phone.  1800: Patient is in the dayroom, watching television.  1900: Patient is outside in the courtyard with staff and other members on the unit.

## 2018-09-18 NOTE — BHH Counselor (Signed)
CSW obtained the requested information.    CSW asked about additional hospitals that can be contacted as requested by [facility].  He suggested that CSW contact [place] and [place], MSW, LCSW 09/18/2018 11:44 AM

## 2018-09-18 NOTE — Plan of Care (Signed)
Patient is continued to be observed very close by staff due to staggering gait and potential for fall, fall has not occurred on this shift , patient is medicated per order , assisted to bed and resting comfortably without any distress, patient denies SI/HI/AVH, monitoring continued.   Problem: Activity: Goal: Will identify at least one activity in which they can participate Outcome: Progressing   Problem: Coping: Goal: Ability to identify and develop effective coping behavior will improve Outcome: Progressing Goal: Ability to interact with others will improve Outcome: Progressing Goal: Demonstration of participation in decision-making regarding own care will improve Outcome: Progressing Goal: Ability to use eye contact when communicating with others will improve Outcome: Progressing   Problem: Health Behavior/Discharge Planning: Goal: Identification of resources available to assist in meeting health care needs will improve Outcome: Progressing   Problem: Self-Concept: Goal: Will verbalize positive feelings about self Outcome: Progressing   Problem: Education: Goal: Utilization of techniques to improve thought processes will improve Outcome: Progressing Goal: Knowledge of the prescribed therapeutic regimen will improve Outcome: Progressing   Problem: Activity: Goal: Interest or engagement in leisure activities will improve Outcome: Progressing Goal: Imbalance in normal sleep/wake cycle will improve Outcome: Progressing   Problem: Coping: Goal: Coping ability will improve Outcome: Progressing Goal: Will verbalize feelings Outcome: Progressing   Problem: Health Behavior/Discharge Planning: Goal: Ability to make decisions will improve Outcome: Progressing Goal: Compliance with therapeutic regimen will improve Outcome: Progressing   Problem: Role Relationship: Goal: Will demonstrate positive changes in social behaviors and relationships Outcome: Progressing   Problem:  Safety: Goal: Ability to disclose and discuss suicidal ideas will improve Outcome: Progressing Goal: Ability to identify and utilize support systems that promote safety will improve Outcome: Progressing   Problem: Self-Concept: Goal: Will verbalize positive feelings about self Outcome: Progressing Goal: Level of anxiety will decrease Outcome: Progressing   Problem: Education: Goal: Ability to verbalize precipitating factors for violent behavior will improve Outcome: Progressing   Problem: Coping: Goal: Ability to verbalize frustrations and anger appropriately will improve Outcome: Progressing   Problem: Health Behavior/Discharge Planning: Goal: Ability to implement measures to prevent violent behavior in the future will improve Outcome: Progressing   Problem: Safety: Goal: Ability to demonstrate self-control will improve Outcome: Progressing Goal: Ability to redirect hostility and anger into socially appropriate behaviors will improve Outcome: Progressing

## 2018-09-18 NOTE — BHH Counselor (Signed)
CSW attempted to contact the patient's care coordinator to obtain the [employer] start information requested by the staff at [facility].  CSW left a HIPAA compliant voicemail.  CSW is working to gather the other matierials requested.  Assunta Curtis, MSW, LCSW 09/18/2018 11:19 AM

## 2018-09-18 NOTE — Progress Notes (Signed)
[facility] MD Progress Note  09/18/2018 5:55 PM Austin Gates  MRN:  [id number] Subjective: Follow-up for this patient with schizoaffective disorder and cognitive disability.  Patient has no new complaints today.  Behavior has been calm and appropriate.  Not aggressive today.  Tolerating medication.  Spoke with his guardian on the telephone just to update her about the current situation. 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 Hypertension   . Intellectual disability   . Obsessive-compulsive disorder   . Schizo-affective schizophrenia [facility])     Past Surgical History:  Procedure Laterality Date  . COLONOSCOPY WITH PROPOFOL N/A 12/28/2017   Procedure: COLONOSCOPY WITH PROPOFOL;  Surgeon: Jonathon Bellows, MD;  Location: [facility] ENDOSCOPY;  Service: Gastroenterology;  Laterality: N/A;  . HYDROCELE EXCISION Bilateral 02/22/2018   Procedure: bilateral HYDROCELECTOMY ADULT;  Surgeon: Cleon Gustin, MD;  Location: [place];  Service: Urology;  Laterality: Bilateral;  . INSERTION OF ILIAC STENT Left 02/22/2018   Procedure: INSERTION LEFT SUPERIOR FEMORAL ARTERY USING 6MM X 5CM VIABHON STENT with mynx device closure on right femoral artery;  Surgeon: Waynetta Sandy, MD;  Location: [place];  Service: Vascular;  Laterality: Left;  . KNEE ARTHROSCOPY    . SCROTAL EXPLORATION N/A 02/22/2018   Procedure: SCROTUM EXPLORATION;  Surgeon: Cleon Gustin, MD;  Location: [place];  Service: Urology;  Laterality: N/A;  . UPPER EXTREMITY ANGIOGRAM Left 02/22/2018   Procedure: left lower EXTREMITY  ANGIOGRAM;  Surgeon: Waynetta Sandy, MD;  Location: [facility] OR;  Service: Vascular;  Laterality: Left;   Family History:  Family History  Problem Relation Age of Onset  . Diabetes Mother    Family Psychiatric  History: See previous Social History:  Social History   Substance and Sexual Activity  Alcohol Use No     Social History   Substance and Sexual Activity  Drug Use No    Social History   Socioeconomic History  . Marital status: Single    Spouse name: Not on file  . Number of children: Not on file  . Years of education: Not on file  . Highest education level: Not on file  Occupational History  . Occupation: Disabled  Social Needs  . Financial resource strain: Not on file  . Food insecurity    Worry: Not on file    Inability: Not on file  . Transportation needs    Medical: Not on file    Non-medical: Not on file  Tobacco Use  . Smoking status: Former Smoker    Types: Cigarettes  . Smokeless tobacco: Never Used  Substance and Sexual Activity  . Alcohol use: No  . Drug use: No  . Sexual activity: Never  Lifestyle  . Physical activity    Days per week: Not on file    Minutes per session: Not on file  . Stress: Not on file  Relationships  . Social Herbalist on phone: Not on file    Gets together: Not on file    Attends religious service: Not on file    Active member of club or organization: Not on file    Attends meetings of clubs  or organizations: Not on file    Relationship status: Not on file  Other Topics Concern  . Not on file  Social History Narrative   ** Merged History Encounter **       Additional Social History:                         Sleep: Fair  Appetite:  Fair  Current Medications: Current Facility-Administered Medications  Medication Dose Route Frequency Provider Last Rate Last Dose  . acetaminophen (TYLENOL) tablet 650 mg  650 mg Oral Q6H PRN Patrecia Pour, NP   650 mg at 09/09/18 1253  . alum & mag  hydroxide-simeth (MAALOX/MYLANTA) 200-200-20 MG/5ML suspension 30 mL  30 mL Oral Q4H PRN Patrecia Pour, NP   30 mL at 09/11/18 1823  . atropine 1 % ophthalmic solution 2 drop  2 drop Sublingual QID Sharma Covert, MD   2 drop at 09/18/18 1642  . clopidogrel (PLAVIX) tablet 75 mg  75 mg Oral Daily Patrecia Pour, NP   75 mg at 09/18/18 0816  . cloZAPine (CLOZARIL) tablet 300 mg  300 mg Oral QHS Sharma Covert, MD   300 mg at 09/17/18 2045  . divalproex (DEPAKOTE) DR tablet 750 mg  750 mg Oral BID Sharma Covert, MD   750 mg at 09/18/18 1642  . gabapentin (NEURONTIN) capsule 300 mg  300 mg Oral TID Johnn Hai, MD   300 mg at 09/18/18 1642  . haloperidol (HALDOL) tablet 5 mg  5 mg Oral Q6H PRN Johnn Hai, MD   5 mg at 09/15/18 1846   Or  . haloperidol lactate (HALDOL) injection 10 mg  10 mg Intramuscular Q6H PRN Johnn Hai, MD      . haloperidol (HALDOL) tablet 5 mg  5 mg Oral Q6H PRN Sharma Covert, MD       Or  . haloperidol lactate (HALDOL) injection 10 mg  10 mg Intramuscular Q6H PRN Sharma Covert, MD   10 mg at 09/13/18 1703  . LORazepam (ATIVAN) tablet 1 mg  1 mg Oral Q6H PRN Johnn Hai, MD   1 mg at 09/15/18 1846   Or  . LORazepam (ATIVAN) injection 2 mg  2 mg Intramuscular Q4H PRN Johnn Hai, MD   2 mg at 09/03/18 0044  . LORazepam (ATIVAN) tablet 1 mg  1 mg Oral BID Johnn Hai, MD   1 mg at 09/18/18 1642  . magnesium hydroxide (MILK OF MAGNESIA) suspension 30 mL  30 mL Oral Daily PRN Patrecia Pour, NP   30 mL at 09/11/18 1136  . multivitamin with minerals tablet 1 tablet  1 tablet Oral Daily [name], [name], MD   1 tablet at 09/18/18 0815  . pantoprazole (PROTONIX) EC tablet 40 mg  40 mg Oral BID Johnn Hai, MD   40 mg at 09/18/18 1642  . zolpidem (AMBIEN) tablet 5 mg  5 mg Oral QHS [name], [name], MD   5 mg at 09/17/18 2045    Lab Results:  Results for orders placed or performed during the hospital encounter of 08/31/18 (from the past 48 hour(s))   Valproic acid level     Status: Abnormal   Collection Time: 09/17/18  6:25 AM  Result Value Ref Range   Valproic Acid Lvl 34 (L) 50.0 - 100.0 ug/mL    Comment: Performed at [facility], [address] [place], [place] [address]  Hepatic function  panel     Status: Abnormal   Collection Time: 09/17/18  6:25 AM  Result Value Ref Range   Total Protein 6.4 (L) 6.5 - 8.1 g/dL   Albumin 3.2 (L) 3.5 - 5.0 g/dL   AST 14 (L) 15 - 41 U/L   ALT 13 0 - 44 U/L   Alkaline Phosphatase 70 38 - 126 U/L   Total Bilirubin 0.6 0.3 - 1.2 mg/dL   Bilirubin, Direct <0.1 0.0 - 0.2 mg/dL   Indirect Bilirubin NOT CALCULATED 0.3 - 0.9 mg/dL    Comment: Performed at [facility], [address] [place], [place] [address]    Blood Alcohol level:  Lab Results  Component Value Date   [facility] <10 08/31/2018   ETH <10 91/47/8295    Metabolic Disorder Labs: Lab Results  Component Value Date   HGBA1C 5.4 09/30/2016   MPG 108.28 09/30/2016   MPG 114 09/17/2016   No results found for: PROLACTIN Lab Results  Component Value Date   CHOL 135 09/30/2016   TRIG 104 02/25/2018   HDL 60 09/30/2016   CHOLHDL 2.3 09/30/2016   VLDL 12 09/30/2016   LDLCALC 63 09/30/2016    Physical Findings: AIMS: Facial and Oral Movements Muscles of Facial Expression: None, normal Lips and Perioral Area: None, normal Jaw: None, normal Tongue: None, normal,Extremity Movements Upper (arms, wrists, hands, fingers): None, normal Lower (legs, knees, ankles, toes): None, normal, Trunk Movements Neck, shoulders, hips: None, normal, Overall Severity Severity of abnormal movements (highest score from questions above): None, normal Incapacitation due to abnormal movements: None, normal Patient's awareness of abnormal movements (rate only patient's report): No Awareness, Dental Status Current problems with teeth and/or dentures?: No Does patient usually wear dentures?: No  CIWA:  CIWA-Ar Total: 2 COWS:  COWS  Total Score: 4  Musculoskeletal: Strength & Muscle Tone: within normal limits Gait & Station: normal Patient leans: N/A  Psychiatric Specialty Exam: Physical Exam  Nursing note and vitals reviewed. Constitutional: He appears well-developed and well-nourished.  HENT:  Head: Normocephalic and atraumatic.  Eyes: Pupils are equal, round, and reactive to light. Conjunctivae are normal.  Neck: Normal range of motion.  Cardiovascular: Regular rhythm and normal heart sounds.  Respiratory: Effort normal. No respiratory distress.  GI: Soft.  Musculoskeletal: Normal range of motion.  Neurological: He is alert.  Skin: Skin is warm and dry.  Psychiatric: His affect is blunt. His speech is delayed. He is slowed. Cognition and memory are impaired. He expresses inappropriate judgment. He expresses no homicidal and no suicidal ideation.    Review of Systems  Unable to perform ROS: Psychiatric disorder    Blood pressure 111/87, pulse 100, temperature 97.6 F (36.4 C), temperature source Oral, resp. rate 18, height 5\' 11"  (1.803 m), weight 67.1 kg, SpO2 100 %.Body mass index is 20.64 kg/m.  General Appearance: Casual  Eye Contact:  Good  Speech:  Slow and Slurred  Volume:  Decreased  Mood:  Dysphoric  Affect:  Constricted  Thought Process:  Irrelevant  Orientation:  Negative  Thought Content:  Illogical  Suicidal Thoughts:  No  Homicidal Thoughts:  No  Memory:  Immediate;   Fair Recent;   Poor Remote;   Poor  Judgement:  Impaired  Insight:  Lacking  Psychomotor Activity:  Restlessness  Concentration:  Concentration: Fair  Recall:  [employer] of Knowledge:  Fair  Language:  Fair  Akathisia:  No  Handed:  Right  AIMS (if indicated):     Assets:  Desire for Improvement Social Support  ADL's:  Impaired  Cognition:  Impaired,  Moderate  Sleep:  Number of Hours: 8.15     Treatment Plan Summary: Daily contact with patient to assess and evaluate symptoms and progress in treatment,  Medication management and Plan Patient appears to be probably at his baseline.  No indication to change medication.  No new labs today.  Supportive counseling and reviewed with him the unfortunate slowness of the process of looking for up place for him to live.  Alethia Berthold, MD 09/18/2018, 5:55 PM

## 2018-09-18 NOTE — BHH Counselor (Signed)
CSW has attempted to contact [facility], [place] and [facility].   CSW had to leave a message with all.   Assunta Curtis, MSW, LCSW 09/18/2018 11:56 AM

## 2018-09-18 NOTE — Plan of Care (Signed)
D- Patient alert and oriented. Patient presents in a pleasant mood on assessment stating that he slept "good" last night and had no complaints to voice to this Probation officer. Patient denies SI, HI, AVH, and pain at this time. Patient also denies any signs/symptoms of depression/anxiety. Patient's goal for today is to "be on my best behavior".  A- Scheduled medications administered to patient, per MD orders. Support and encouragement provided.  Routine safety checks conducted every 15 minutes.  Patient informed to notify staff with problems or concerns.  R- No adverse drug reactions noted. Patient contracts for safety at this time. Patient compliant with medications and treatment plan. Patient receptive, calm, and cooperative. Patient interacts well with others on the unit.  Patient remains safe at this time.  Problem: Activity: Goal: Will identify at least one activity in which they can participate Outcome: Progressing   Problem: Coping: Goal: Ability to identify and develop effective coping behavior will improve Outcome: Progressing Goal: Ability to interact with others will improve Outcome: Progressing Goal: Demonstration of participation in decision-making regarding own care will improve Outcome: Progressing Goal: Ability to use eye contact when communicating with others will improve Outcome: Progressing   Problem: Health Behavior/Discharge Planning: Goal: Identification of resources available to assist in meeting health care needs will improve Outcome: Progressing   Problem: Self-Concept: Goal: Will verbalize positive feelings about self Outcome: Progressing   Problem: Education: Goal: Utilization of techniques to improve thought processes will improve Outcome: Progressing Goal: Knowledge of the prescribed therapeutic regimen will improve Outcome: Progressing   Problem: Activity: Goal: Interest or engagement in leisure activities will improve Outcome: Progressing Goal: Imbalance in  normal sleep/wake cycle will improve Outcome: Progressing   Problem: Coping: Goal: Coping ability will improve Outcome: Progressing Goal: Will verbalize feelings Outcome: Progressing   Problem: Health Behavior/Discharge Planning: Goal: Ability to make decisions will improve Outcome: Progressing Goal: Compliance with therapeutic regimen will improve Outcome: Progressing   Problem: Role Relationship: Goal: Will demonstrate positive changes in social behaviors and relationships Outcome: Progressing   Problem: Safety: Goal: Ability to disclose and discuss suicidal ideas will improve Outcome: Progressing Goal: Ability to identify and utilize support systems that promote safety will improve Outcome: Progressing   Problem: Self-Concept: Goal: Will verbalize positive feelings about self Outcome: Progressing Goal: Level of anxiety will decrease Outcome: Progressing   Problem: Education: Goal: Ability to verbalize precipitating factors for violent behavior will improve Outcome: Progressing   Problem: Coping: Goal: Ability to verbalize frustrations and anger appropriately will improve Outcome: Progressing   Problem: Health Behavior/Discharge Planning: Goal: Ability to implement measures to prevent violent behavior in the future will improve Outcome: Progressing   Problem: Safety: Goal: Ability to demonstrate self-control will improve Outcome: Progressing Goal: Ability to redirect hostility and anger into socially appropriate behaviors will improve Outcome: Progressing

## 2018-09-18 NOTE — BHH Group Notes (Signed)
LCSW Group Therapy Note  09/18/2018 1:00 PM  Type of Therapy/Topic:  Group Therapy:  Feelings about Diagnosis  Participation Level:  Minimal   Description of Group:   This group will allow patients to explore their thoughts and feelings about diagnoses they have received. Patients will be guided to explore their level of understanding and acceptance of these diagnoses. Facilitator will encourage patients to process their thoughts and feelings about the reactions of others to their diagnosis and will guide patients in identifying ways to discuss their diagnosis with significant others in their lives. This group will be process-oriented, with patients participating in exploration of their own experiences, giving and receiving support, and processing challenge from other group members.   Therapeutic Goals: 1. Patient will demonstrate understanding of diagnosis as evidenced by identifying two or more symptoms of the disorder 2. Patient will be able to express two feelings regarding the diagnosis 3. Patient will demonstrate their ability to communicate their needs through discussion and/or role play  Summary of Patient Progress: Patient was present for group. Patient did not participate in group discussion.  Patient appeared to be preoccupied with asking what time it was and what time group ended.   Therapeutic Modalities:   Cognitive Behavioral Therapy Brief Therapy Feelings Identification   Assunta Curtis, MSW, LCSW 09/18/2018 1:00 PM

## 2018-09-18 NOTE — BHH Counselor (Signed)
CSW contacted [facility] to follow up on diversion form.  CSW was informed that the following information was required: -copy of the IVC paperwork is needed, including information on when commitment ends -72 hours worth of nurses notes -3 other hospitals need to have been contacted prior to this referral being sent -[place] start information is needed on the paperwork  CSW spoke with Joneen Boers an Therapist, sports at [place].  Joneen Boers provided the above information and was unable to answer this CSW's follow up questions, simply stating "I'm just reading the form."  CSW pointed out that the information he listed is not requested on the paperwork and Joneen Boers repeated himself.  Assunta Curtis, MSW, LCSW 09/18/2018 11:10 AM

## 2018-09-18 NOTE — Progress Notes (Signed)
Recreation Therapy Notes    Date: 09/18/2018  Time: 9:30 am  Location: Craft room  Behavioral response: Appropriate   Intervention Topic: Problem Solving   Discussion/Intervention:  Group content on today was focused on problem solving. The group described what problem solving is. Patients expressed how problems affect them and how they deal with problems. Individuals identified healthy ways to deal with problems. Patients explained what normally happens to them when they do not deal with problems. The group expressed reoccurring problems for them. The group participated in the intervention "Ways to Solve problems" where patients were given a chance to explore different ways to solve problems.  Clinical Observations/Feedback:  Patient came to group and was focused on what peers and staff had to say about problem solving. Individual was social with peers and staff while participating in the intervention.  Landi Biscardi LRT/CTRS        Jamal Haskin 09/18/2018 11:02 AM

## 2018-09-19 NOTE — BHH Counselor (Signed)
CSW received call from supervisor at [facility].  She reports that she is signing off on and send paperwork to this CSW momentarily.  CSW received the information and has sent to [facility] to continue the referral process.   CSW called [facility] to check that they have received it.  CSW was informed "we've got about 100 pages of faxes coming in. It may be behind the next one". CSW will follow up tomorrow.  Assunta Curtis, MSW, LCSW 09/19/2018 3:18 PM

## 2018-09-19 NOTE — BHH Counselor (Signed)
CSW spoke with Frankey Poot at [place].  He reports that "everything looks good".  He reports that all requested paperwork is in and that nothing additional is needed.  He reports that his supervisor will follow up.  CSW provided contact information.  Assunta Curtis, MSW, LCSW 09/19/2018 3:05 PM

## 2018-09-19 NOTE — Progress Notes (Signed)
Patient slept through the night with out any incident or falls only routine frequent checks and  15 minutes safety checks for safety.no distress.

## 2018-09-19 NOTE — Progress Notes (Signed)
[facility] MD Progress Note  09/19/2018 4:48 PM Austin Gates  MRN:  [id number] Subjective: Patient seen chart reviewed.  Patient with no new complaints.  No new behavior problems.  No physical problems. 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 Hypertension   . Intellectual disability   . Obsessive-compulsive disorder   . Schizo-affective schizophrenia [facility])     Past Surgical History:  Procedure Laterality Date  . COLONOSCOPY WITH PROPOFOL N/A 12/28/2017   Procedure: COLONOSCOPY WITH PROPOFOL;  Surgeon: Jonathon Bellows, MD;  Location: [facility] ENDOSCOPY;  Service: Gastroenterology;  Laterality: N/A;  . HYDROCELE EXCISION Bilateral 02/22/2018   Procedure: bilateral HYDROCELECTOMY ADULT;  Surgeon: Cleon Gustin, MD;  Location: [place];  Service: Urology;  Laterality: Bilateral;  . INSERTION OF ILIAC STENT Left 02/22/2018   Procedure: INSERTION LEFT SUPERIOR FEMORAL ARTERY USING 6MM X 5CM VIABHON STENT with mynx device closure on right femoral artery;  Surgeon: Waynetta Sandy, MD;  Location: [place];  Service: Vascular;  Laterality: Left;  . KNEE ARTHROSCOPY    . SCROTAL EXPLORATION N/A 02/22/2018   Procedure: SCROTUM EXPLORATION;  Surgeon: Cleon Gustin, MD;  Location: [place];  Service: Urology;  Laterality: N/A;  . UPPER EXTREMITY ANGIOGRAM Left 02/22/2018   Procedure: left lower EXTREMITY ANGIOGRAM;  Surgeon: Waynetta Sandy, MD;  [facility];  Service: Vascular;  Laterality: Left;   Family History:  Family History  Problem Relation Age of Onset  . Diabetes Mother     Family Psychiatric  History: See previous Social History:  Social History   Substance and Sexual Activity  Alcohol Use No     Social History   Substance and Sexual Activity  Drug Use No    Social History   Socioeconomic History  . Marital status: Single    Spouse name: Not on file  . Number of children: Not on file  . Years of education: Not on file  . Highest education level: Not on file  Occupational History  . Occupation: Disabled  Social Needs  . Financial resource strain: Not on file  . Food insecurity    Worry: Not on file    Inability: Not on file  . Transportation needs    Medical: Not on file    Non-medical: Not on file  Tobacco Use  . Smoking status: Former Smoker    Types: Cigarettes  . Smokeless tobacco: Never Used  Substance and Sexual Activity  . Alcohol use: No  . Drug use: No  . Sexual activity: Never  Lifestyle  . Physical activity    Days per week: Not on file    Minutes per session: Not on file  . Stress: Not on file  Relationships  . Social Herbalist on phone: Not on file    Gets together: Not on file    Attends religious service: Not on file    Active member of club or organization: Not on file    Attends meetings of clubs or organizations: Not on file    Relationship status: Not on file  Other Topics Concern  . Not on file  Social History Narrative   ** Merged  History Encounter **       Additional Social History:                         Sleep: Fair  Appetite:  Fair  Current Medications: Current Facility-Administered Medications  Medication Dose Route Frequency Provider Last Rate Last Dose  . acetaminophen (TYLENOL) tablet 650 mg  650 mg Oral Q6H PRN Patrecia Pour, NP   650 mg at 09/09/18 1253  . alum & mag hydroxide-simeth (MAALOX/MYLANTA) 200-200-20 MG/5ML suspension 30 mL  30 mL Oral Q4H PRN Patrecia Pour, NP   30 mL at 09/11/18 1823  . atropine 1 % ophthalmic solution 2 drop  2 drop Sublingual  QID Sharma Covert, MD   2 drop at 09/19/18 1643  . clopidogrel (PLAVIX) tablet 75 mg  75 mg Oral Daily Patrecia Pour, NP   75 mg at 09/19/18 0801  . cloZAPine (CLOZARIL) tablet 300 mg  300 mg Oral QHS Sharma Covert, MD   300 mg at 09/18/18 2058  . divalproex (DEPAKOTE) DR tablet 750 mg  750 mg Oral BID Sharma Covert, MD   750 mg at 09/19/18 1643  . gabapentin (NEURONTIN) capsule 300 mg  300 mg Oral TID Johnn Hai, MD   300 mg at 09/19/18 1644  . haloperidol (HALDOL) tablet 5 mg  5 mg Oral Q6H PRN Johnn Hai, MD   5 mg at 09/15/18 1846   Or  . haloperidol lactate (HALDOL) injection 10 mg  10 mg Intramuscular Q6H PRN Johnn Hai, MD      . haloperidol (HALDOL) tablet 5 mg  5 mg Oral Q6H PRN Sharma Covert, MD       Or  . haloperidol lactate (HALDOL) injection 10 mg  10 mg Intramuscular Q6H PRN Sharma Covert, MD   10 mg at 09/13/18 1703  . LORazepam (ATIVAN) tablet 1 mg  1 mg Oral Q6H PRN Johnn Hai, MD   1 mg at 09/15/18 1846   Or  . LORazepam (ATIVAN) injection 2 mg  2 mg Intramuscular Q4H PRN Johnn Hai, MD   2 mg at 09/03/18 0044  . LORazepam (ATIVAN) tablet 1 mg  1 mg Oral BID Johnn Hai, MD   1 mg at 09/19/18 1643  . magnesium hydroxide (MILK OF MAGNESIA) suspension 30 mL  30 mL Oral Daily PRN Patrecia Pour, NP   30 mL at 09/11/18 1136  . multivitamin with minerals tablet 1 tablet  1 tablet Oral Daily [name], [name], MD   1 tablet at 09/19/18 0801  . pantoprazole (PROTONIX) EC tablet 40 mg  40 mg Oral BID Johnn Hai, MD   40 mg at 09/19/18 1644  . zolpidem (AMBIEN) tablet 5 mg  5 mg Oral QHS [name], John T, MD   5 mg at 09/18/18 2058    Lab Results: No results found for this or any previous visit (from the past 48 hour(s)).  Blood Alcohol level:  Lab Results  Component Value Date   ETH <10 08/31/2018   ETH <10 41/63/8453    Metabolic Disorder Labs: Lab Results  Component Value Date   HGBA1C 5.4 09/30/2016   MPG 108.28 09/30/2016   MPG  114 09/17/2016   No results found for: PROLACTIN Lab Results  Component Value Date   CHOL 135 09/30/2016   TRIG 104 02/25/2018   HDL 60 09/30/2016   CHOLHDL 2.3 09/30/2016   VLDL 12 09/30/2016  [place] 63 09/30/2016    Physical Findings: AIMS: Facial and Oral Movements Muscles of Facial Expression: None, normal Lips and Perioral Area: None, normal Jaw: None, normal Tongue: None, normal,Extremity Movements Upper (arms, wrists, hands, fingers): None, normal Lower (legs, knees, ankles, toes): None, normal, Trunk Movements Neck, shoulders, hips: None, normal, Overall Severity Severity of abnormal movements (highest score from questions above): None, normal Incapacitation due to abnormal movements: None, normal Patient's awareness of abnormal movements (rate only patient's report): No Awareness, Dental Status Current problems with teeth and/or dentures?: No Does patient usually wear dentures?: No  CIWA:  CIWA-Ar Total: 2 COWS:  COWS Total Score: 4  Musculoskeletal: Strength & Muscle Tone: within normal limits Gait & Station: normal Patient leans: Right  Psychiatric Specialty Exam: Physical Exam  Nursing note and vitals reviewed. Constitutional: He appears well-developed and well-nourished.  HENT:  Head: Normocephalic and atraumatic.  Eyes: Pupils are equal, round, and reactive to light. Conjunctivae are normal.  Neck: Normal range of motion.  Cardiovascular: Normal heart sounds.  Respiratory: Effort normal.  GI: Soft.  Musculoskeletal: Normal range of motion.  Neurological: He is alert.  Skin: Skin is warm and dry.  Psychiatric: His affect is blunt. His speech is delayed. He is slowed. Cognition and memory are impaired. He expresses no homicidal and no suicidal ideation.    Review of Systems  Constitutional: Negative.   HENT: Negative.   Eyes: Negative.   Respiratory: Negative.   Cardiovascular: Negative.   Gastrointestinal: Negative.   Musculoskeletal: Negative.    Skin: Negative.   Neurological: Negative.   Psychiatric/Behavioral: Negative for depression, hallucinations, substance abuse and suicidal ideas.    Blood pressure 117/85, pulse 95, temperature (!) 97.4 F (36.3 C), temperature source Oral, resp. rate 18, height 5\' 11"  (1.803 m), weight 67.1 kg, SpO2 100 %.Body mass index is 20.64 kg/m.  General Appearance: Casual  Eye Contact:  Good  Speech:  Garbled  Volume:  Decreased  Mood:  Euthymic  Affect:  Constricted  Thought Process:  Disorganized  Orientation:  Full (Time, Place, and Person)  Thought Content:  Rumination  Suicidal Thoughts:  No  Homicidal Thoughts:  No  Memory:  Immediate;   Fair Recent;   Fair Remote;   Fair  Judgement:  Fair  Insight:  Fair  Psychomotor Activity:  Decreased  Concentration:  Concentration: Poor  Recall:  [place] of Knowledge:  Poor  Language:  Poor  Akathisia:  No  Handed:  Right  AIMS (if indicated):     Assets:  Desire for Improvement  ADL's:  Impaired  Cognition:  Impaired,  Moderate  Sleep:  Number of Hours: 8     Treatment Plan Summary: Plan No change to medication management.  Patient appears to be stable.  We are working with his outpatient treatment team on discharge planning  Alethia Berthold, MD 09/19/2018, 4:48 PM

## 2018-09-19 NOTE — B[facility] Group Notes (Signed)
LCSW Group Therapy Note  09/19/2018 11:45 AM  Type of Therapy/Topic:  Group Therapy:  Emotion Regulation  Participation Level:  Did Not Attend   Description of Group:   The purpose of this group is to assist patients in learning to regulate negative emotions and experience positive emotions. Patients will be guided to discuss ways in which they have been vulnerable to their negative emotions. These vulnerabilities will be juxtaposed with experiences of positive emotions or situations, and patients will be challenged to use positive emotions to combat negative ones. Special emphasis will be placed on coping with negative emotions in conflict situations, and patients will process healthy conflict resolution skills.  Therapeutic Goals: 1. Patient will identify two positive emotions or experiences to reflect on in order to balance out negative emotions 2. Patient will label two or more emotions that they find the most difficult to experience 3. Patient will demonstrate positive conflict resolution skills through discussion and/or role plays  Summary of Patient Progress: x   Therapeutic Modalities:   Cognitive Behavioral Therapy Feelings Identification Dialectical Behavioral Therapy   [name], MSW, LCSW Clinical Social Work 09/19/2018 11:45 AM

## 2018-09-19 NOTE — Plan of Care (Signed)
Patient is visible, active and cooperative per encouragements. Frequently presenting to the nurses station to ask for food. Patient went out with peers. He is more alert and behaviors improving. Compliant with medications. Appetite is great. Sleeping well. No sign of distress. Encouragements provided and safety maintained.

## 2018-09-19 NOTE — BHH Counselor (Signed)
CSW followed up with the pt's Care Coordinator.  He reports that "[facility]" thinks that the patient may be a good fit.  He also reports that [facility] that interviewed the patient on 7/24 is still awaiting to talk with the guardian.  Assunta Curtis, MSW, LCSW 09/19/2018 2:46 PM

## 2018-09-20 NOTE — Progress Notes (Signed)
D - Patient was in the day room upon arrival to the unit. Patient was pleasant during assessment and medication administration. Patient denies SI/HI/AVH, pain, anxiety and depression with this Probation officer. Patient had no outbursts this evening and was compliant with procedures on the unit.   A - Patient compliant with medication administration per MD orders. Patient given education. Patient given support and encouragement to be active in his treatment plan. Patient informed to let staff know if there are any issues or problems on the unit.   R - Patient being monitored Q 15 minutes for safety per unit protocol. Patient remains safe on the unit.

## 2018-09-20 NOTE — Plan of Care (Signed)
Patient wanted to refuse medications this evening any only took it because he thought he was going to get another snack after taking the medications.   Problem: Education: Goal: Knowledge of the prescribed therapeutic regimen will improve Outcome: Not Progressing

## 2018-09-20 NOTE — Progress Notes (Signed)
D - Patient was in the day room upon arrival to the unit. Patient was pleasant during assessment and medication administration. Patient denies SI/HI/AVH, pain, anxiety and depression with this Probation officer. Patient had no outbursts this evening and was compliant with procedures on the unit. Patient wanted cereal an hour after snack but was given education. Patient was compliant.   A - Patient compliant with medication administration per MD orders. Patient given education. Patient given support and encouragement to be active in his treatment plan. Patient informed to let staff know if there are any issues or problems on the unit.   R - Patient being monitored Q 15 minutes for safety per unit protocol. Patient remains safe on the unit.

## 2018-09-20 NOTE — Progress Notes (Addendum)
Pt. Given PRN medication for observed anxiety and indigestion for comfort and safety. Medication effective. Will continue to monitor.

## 2018-09-20 NOTE — Progress Notes (Signed)
Recreation Therapy Notes  Date: 09/20/2018  Time: 9:30 am  Location: Craft room  Behavioral response: Appropriate   Intervention Topic: Self-care  Discussion/Intervention:  Group content today was focused on Self-Care. The group defined self-care and some positive ways they care for themselves. Individuals expressed ways and reasons why they neglected any self-care in the past. Patients described ways to improve self-care in the future. The group explained what could happen if they did not do any self-care activities at all. The group participated in the intervention "self-care assessment" where they had a chance to discover some of their weaknesses and strengths in self- care. Patient came up with a self-care plan to improve themselves in the future.  Clinical Observations/Feedback:  Patient came to group and was focused on the group topic. He left early due to unknown reasons and never returned.  Setsuko Robins LRT/CTRS         Alekxander Isola 09/20/2018 10:49 AM

## 2018-09-20 NOTE — Progress Notes (Signed)
Observation Note  Patient at this time observed outside playing with other patients. Pt. When engaged to check on him requests more PRN medication for indigestion. Pt. Given education on slowing down during meals, because he has been observed eating very fast, which might be causing the discomfort, but will present to attending for safety.

## 2018-09-20 NOTE — Progress Notes (Signed)
[facility] MD Progress Note  09/20/2018 4:46 PM Austin Gates  MRN:  [id number] Subjective: No new complaints.  Patient appears to be content.  Interacts with other patients and staff appropriately.  No aggression or threats. 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 Hypertension   . Intellectual disability   . Obsessive-compulsive disorder   . Schizo-affective schizophrenia [facility])     Past Surgical History:  Procedure Laterality Date  . COLONOSCOPY WITH PROPOFOL N/A 12/28/2017   Procedure: COLONOSCOPY WITH PROPOFOL;  Surgeon: Jonathon Bellows, MD;  Location: [facility] ENDOSCOPY;  Service: Gastroenterology;  Laterality: N/A;  . HYDROCELE EXCISION Bilateral 02/22/2018   Procedure: bilateral HYDROCELECTOMY ADULT;  Surgeon: Cleon Gustin, MD;  Location: [place];  Service: Urology;  Laterality: Bilateral;  . INSERTION OF ILIAC STENT Left 02/22/2018   Procedure: INSERTION LEFT SUPERIOR FEMORAL ARTERY USING 6MM X 5CM VIABHON STENT with mynx device closure on right femoral artery;  Surgeon: Waynetta Sandy, MD;  Location: [place];  Service: Vascular;  Laterality: Left;  . KNEE ARTHROSCOPY    . SCROTAL EXPLORATION N/A 02/22/2018   Procedure: SCROTUM EXPLORATION;  Surgeon: Cleon Gustin, MD;  Location: [place];  Service: Urology;  Laterality: N/A;  . UPPER EXTREMITY ANGIOGRAM Left 02/22/2018   Procedure: left lower EXTREMITY ANGIOGRAM;  Surgeon: Waynetta Sandy, MD;  Location: [facility] OR;  Service: Vascular;  Laterality: Left;   Family History:  Family History  Problem Relation Age of Onset  . Diabetes  Mother    Family Psychiatric  History: See previous Social History:  Social History   Substance and Sexual Activity  Alcohol Use No     Social History   Substance and Sexual Activity  Drug Use No    Social History   Socioeconomic History  . Marital status: Single    Spouse name: Not on file  . Number of children: Not on file  . Years of education: Not on file  . Highest education level: Not on file  Occupational History  . Occupation: Disabled  Social Needs  . Financial resource strain: Not on file  . Food insecurity    Worry: Not on file    Inability: Not on file  . Transportation needs    Medical: Not on file    Non-medical: Not on file  Tobacco Use  . Smoking status: Former Smoker    Types: Cigarettes  . Smokeless tobacco: Never Used  Substance and Sexual Activity  . Alcohol use: No  . Drug use: No  . Sexual activity: Never  Lifestyle  . Physical activity    Days per week: Not on file    Minutes per session: Not on file  . Stress: Not on file  Relationships  . Social Herbalist on phone: Not on file    Gets together: Not on file    Attends religious service: Not on file    Active member of club or organization: Not on file    Attends meetings of clubs or organizations: Not on file    Relationship status: Not on file  Other Topics Concern  . Not on file  Social History Narrative   **  Merged History Encounter **       Additional Social History:                         Sleep: Fair  Appetite:  Fair  Current Medications: Current Facility-Administered Medications  Medication Dose Route Frequency Provider Last Rate Last Dose  . acetaminophen (TYLENOL) tablet 650 mg  650 mg Oral Q6H PRN Patrecia Pour, NP   650 mg at 09/09/18 1253  . alum & mag hydroxide-simeth (MAALOX/MYLANTA) 200-200-20 MG/5ML suspension 30 mL  30 mL Oral Q4H PRN Patrecia Pour, NP   30 mL at 09/20/18 1508  . atropine 1 % ophthalmic solution 2 drop  2 drop  Sublingual QID Sharma Covert, MD   2 drop at 09/20/18 1204  . clopidogrel (PLAVIX) tablet 75 mg  75 mg Oral Daily Patrecia Pour, NP   75 mg at 09/20/18 9476  . cloZAPine (CLOZARIL) tablet 300 mg  300 mg Oral QHS Sharma Covert, MD   300 mg at 09/19/18 2137  . divalproex (DEPAKOTE) DR tablet 750 mg  750 mg Oral BID Sharma Covert, MD   750 mg at 09/20/18 5465  . gabapentin (NEURONTIN) capsule 300 mg  300 mg Oral TID Johnn Hai, MD   300 mg at 09/20/18 1204  . haloperidol (HALDOL) tablet 5 mg  5 mg Oral Q6H PRN Johnn Hai, MD   5 mg at 09/15/18 1846   Or  . haloperidol lactate (HALDOL) injection 10 mg  10 mg Intramuscular Q6H PRN Johnn Hai, MD      . haloperidol (HALDOL) tablet 5 mg  5 mg Oral Q6H PRN Sharma Covert, MD       Or  . haloperidol lactate (HALDOL) injection 10 mg  10 mg Intramuscular Q6H PRN Sharma Covert, MD   10 mg at 09/13/18 1703  . LORazepam (ATIVAN) tablet 1 mg  1 mg Oral Q6H PRN Johnn Hai, MD   1 mg at 09/20/18 0354   Or  . LORazepam (ATIVAN) injection 2 mg  2 mg Intramuscular Q4H PRN Johnn Hai, MD   2 mg at 09/03/18 0044  . LORazepam (ATIVAN) tablet 1 mg  1 mg Oral BID Johnn Hai, MD   1 mg at 09/20/18 6568  . magnesium hydroxide (MILK OF MAGNESIA) suspension 30 mL  30 mL Oral Daily PRN Patrecia Pour, NP   30 mL at 09/11/18 1136  . multivitamin with minerals tablet 1 tablet  1 tablet Oral Daily [name], [name], MD   1 tablet at 09/20/18 [phone]  . pantoprazole (PROTONIX) EC tablet 40 mg  40 mg Oral BID Johnn Hai, MD   40 mg at 09/20/18 1700  . zolpidem (AMBIEN) tablet 5 mg  5 mg Oral QHS [name], [name], MD   5 mg at 09/19/18 2137    Lab Results: No results found for this or any previous visit (from the past 48 hour(s)).  Blood Alcohol level:  Lab Results  Component Value Date   ETH <10 08/31/2018   ETH <10 17/49/4496    Metabolic Disorder Labs: Lab Results  Component Value Date   HGBA1C 5.4 09/30/2016   MPG 108.28  09/30/2016   MPG 114 09/17/2016   No results found for: PROLACTIN Lab Results  Component Value Date   CHOL 135 09/30/2016   TRIG 104 02/25/2018   HDL 60 09/30/2016   CHOLHDL 2.3 09/30/2016   VLDL 12 09/30/2016  [place] 63 09/30/2016    Physical Findings: AIMS: Facial and Oral Movements Muscles of Facial Expression: None, normal Lips and Perioral Area: None, normal Jaw: None, normal Tongue: None, normal,Extremity Movements Upper (arms, wrists, hands, fingers): None, normal Lower (legs, knees, ankles, toes): None, normal, Trunk Movements Neck, shoulders, hips: None, normal, Overall Severity Severity of abnormal movements (highest score from questions above): None, normal Incapacitation due to abnormal movements: None, normal Patient's awareness of abnormal movements (rate only patient's report): No Awareness, Dental Status Current problems with teeth and/or dentures?: No Does patient usually wear dentures?: No  CIWA:  CIWA-Ar Total: 2 COWS:  COWS Total Score: 4  Musculoskeletal: Strength & Muscle Tone: within normal limits Gait & Station: normal Patient leans: N/A  Psychiatric Specialty Exam: Physical Exam  Nursing note and vitals reviewed. Constitutional: He appears well-developed and well-nourished.  HENT:  Head: Normocephalic and atraumatic.  Eyes: Pupils are equal, round, and reactive to light. Conjunctivae are normal.  Neck: Normal range of motion.  Cardiovascular: Regular rhythm and normal heart sounds.  Respiratory: Effort normal.  GI: Soft.  Musculoskeletal: Normal range of motion.  Neurological: He is alert.  Skin: Skin is warm and dry.  Psychiatric: His affect is blunt. His speech is delayed. He is slowed. Thought content is not paranoid. Cognition and memory are impaired. He expresses inappropriate judgment. He expresses no homicidal and no suicidal ideation.    Review of Systems  Unable to perform ROS: Psychiatric disorder  Constitutional: Negative.    HENT: Negative.   Eyes: Negative.   Respiratory: Negative.   Cardiovascular: Negative.   Gastrointestinal: Negative.   Musculoskeletal: Negative.   Skin: Negative.   Neurological: Negative.   Psychiatric/Behavioral: Negative.     Blood pressure 105/77, pulse 96, temperature 97.6 F (36.4 C), temperature source Oral, resp. rate 18, height 5\' 11"  (1.803 m), weight 67.1 kg, SpO2 98 %.Body mass index is 20.64 kg/m.  General Appearance: Casual  Eye Contact:  Good  Speech:  Clear and Coherent  Volume:  Normal  Mood:  Euthymic  Affect:  Congruent  Thought Process:  Goal Directed  Orientation:  Full (Time, Place, and Person)  Thought Content:  Logical  Suicidal Thoughts:  No  Homicidal Thoughts:  No  Memory:  Immediate;   Fair Recent;   Fair Remote;   Fair  Judgement:  Fair  Insight:  Fair  Psychomotor Activity:  Normal  Concentration:  Concentration: Fair  Recall:  [employer] of Knowledge:  Fair  Language:  Fair  Akathisia:  No  Handed:  Right  AIMS (if indicated):     Assets:  Desire for Improvement  ADL's:  Intact  Cognition:  WNL  Sleep:  Number of Hours: 8     Treatment Plan Summary: Plan No change to clinical situation.  Patient is stable without any behavior problems.  Primary issue continues to be placement.  Alethia Berthold, MD 09/20/2018, 4:46 PM

## 2018-09-20 NOTE — Progress Notes (Signed)
D: Pt during assessments is flat and blank utilizing garbled speech. Pt. Continues to drool frequently. Pt. Denies all when asked, but later asks for PRN medication for comfort and relief.     A: Q x 4 observation checks by this writer to be completed for safety. Patient was provided with education. Patient was given/offered medications per orders. Patient was encouraged to attend groups, participate in unit activities and continue with plan of care. Pt. Chart and plans of care reviewed. Pt. Given support and encouragement.   R: Patient is complaint with medication and unit procedures this morning. Pt. Eating good encouraged to utilize special precautions for aspiration precautions. Pt. Given medications per special instructions for aspiration precautions.

## 2018-09-20 NOTE — [facility] Group Notes (Signed)
Balance In Life 09/20/2018 1PM  Type of Therapy/Topic:  Group Therapy:  Balance in Life  Participation Level:  None  Description of Group:   This group will address the concept of balance and how it feels and looks when one is unbalanced. Patients will be encouraged to process areas in their lives that are out of balance and identify reasons for remaining unbalanced. Facilitators will guide patients in utilizing problem-solving interventions to address and correct the stressor making their life unbalanced. Understanding and applying boundaries will be explored and addressed for obtaining and maintaining a balanced life. Patients will be encouraged to explore ways to assertively make their unbalanced needs known to significant others in their lives, using other group members and facilitator for support and feedback.  Therapeutic Goals: 1. Patient will identify two or more emotions or situations they have that consume much of in their lives. 2. Patient will identify signs/triggers that life has become out of balance:  3. Patient will identify two ways to set boundaries in order to achieve balance in their lives:  4. Patient will demonstrate ability to communicate their needs through discussion and/or role plays  Summary of Patient Progress: Pt attended group but no input provided.   Therapeutic Modalities:   Cognitive Behavioral Therapy Solution-Focused Therapy Assertiveness Training  Austin Gates [name], LCSW

## 2018-09-20 NOTE — BHH Counselor (Signed)
CSW called [employer] and spoke with Gwyndolyn Saxon.    CSW confirmed that patient was on waitlist.  CSW confirmed that they received the exception to diversion form.   CSW was informed that "each time you all have to file for a new commitment, you have to re-petition for an exception.  Assunta Curtis, MSW, LCSW 09/20/2018 9:51 AM

## 2018-09-20 NOTE — Progress Notes (Signed)
Observation Note  Patient at and around this time observed resting in his room with observer present observing. Will continue to monitor for safety.

## 2018-09-20 NOTE — Plan of Care (Signed)
Pt. Interactions observed improving with staff and peers on the unit. Pt. Complaint with medications. Pt. During assessments denies si/hi/avh, contracts for safety. Pt. Monitored for safety with 1:1 observer for safety. Pt. Able to remain safe on the unit.   Problem: Coping: Goal: Ability to interact with others will improve Outcome: Progressing   Problem: Health Behavior/Discharge Planning: Goal: Compliance with therapeutic regimen will improve Outcome: Progressing   Problem: Safety: Goal: Ability to disclose and discuss suicidal ideas will improve Outcome: Progressing

## 2018-09-20 NOTE — Plan of Care (Signed)
Patient is compliant with medication administration but doesn't understand why he is taking the medications he is prescribed.   Problem: Education: Goal: Knowledge of the prescribed therapeutic regimen will improve Outcome: Not Progressing

## 2018-09-20 NOTE — Progress Notes (Signed)
Observation Note  Patient observed walking around the unit calm and cooperative. No distress noted. Will continue to monitor.

## 2018-09-21 LAB — CBC WITH DIFFERENTIAL/PLATELET
Abs Immature Granulocytes: 0.09 10*3/uL — ABNORMAL HIGH (ref 0.00–0.07)
Basophils Absolute: 0 10*3/uL (ref 0.0–0.1)
Basophils Relative: 1 %
Eosinophils Absolute: 0.1 10*3/uL (ref 0.0–0.5)
Eosinophils Relative: 3 %
HCT: 33.8 % — ABNORMAL LOW (ref 39.0–52.0)
Hemoglobin: 10.7 g/dL — ABNORMAL LOW (ref 13.0–17.0)
Immature Granulocytes: 2 %
Lymphocytes Relative: 23 %
Lymphs Abs: 0.9 10*3/uL (ref 0.7–4.0)
MCH: 30 pg (ref 26.0–34.0)
MCHC: 31.7 g/dL (ref 30.0–36.0)
MCV: 94.7 fL (ref 80.0–100.0)
Monocytes Absolute: 0.4 10*3/uL (ref 0.1–1.0)
Monocytes Relative: 10 %
Neutro Abs: 2.4 10*3/uL (ref 1.7–7.7)
Neutrophils Relative %: 61 %
Platelets: 158 10*3/uL (ref 150–400)
RBC: 3.57 MIL/uL — ABNORMAL LOW (ref 4.22–5.81)
RDW: 14.3 % (ref 11.5–15.5)
WBC: 3.9 10*3/uL — ABNORMAL LOW (ref 4.0–10.5)
nRBC: 1.3 % — ABNORMAL HIGH (ref 0.0–0.2)

## 2018-09-21 MED ORDER — TUBERCULIN PPD 5 UNIT/0.1ML ID SOLN
5.0000 [IU] | Freq: Once | INTRADERMAL | Status: AC
  Administered 2018-09-22: 06:00:00 5 [IU] via INTRADERMAL
  Filled 2018-09-21 (×3): qty 0.1

## 2018-09-21 NOTE — Progress Notes (Signed)
CSW received a call from [facility] with [facility]. [name] reported that they would go ahead and accept pt into their group home in [facility] and he would have his QP, Brittney contact CSW to discuss any information she will need and discuss discharge date and transportation for pt.  [name], MSW, LCSW Clinical Social Work 09/21/2018 11:44 AM

## 2018-09-21 NOTE — Tx Team (Signed)
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 acetaminophen (TYLENOL) tablet 650 mg  650 mg Oral Q6H PRN Patrecia Pour, NP   650 mg at 09/09/18 1253  . alum & mag hydroxide-simeth (MAALOX/MYLANTA) 200-200-20 MG/5ML suspension 30 mL  30 mL Oral Q4H PRN Patrecia Pour, NP   30 mL at 09/20/18 1508  . atropine 1 % ophthalmic solution 2 drop  2 drop Sublingual QID Sharma Covert, MD   2 drop at 09/21/18 1202  . clopidogrel (PLAVIX) tablet 75 mg  75 mg Oral Daily Patrecia Pour, NP   75 mg at 09/21/18 1017  . cloZAPine (CLOZARIL) tablet 300 mg  300 mg Oral QHS Sharma Covert, MD   300 mg at 09/20/18 2111  . divalproex (DEPAKOTE) DR tablet 750 mg  750 mg Oral BID Sharma Covert, MD   750 mg at 09/21/18 1018  . gabapentin (NEURONTIN) capsule 300 mg  300 mg Oral TID Johnn Hai, MD   300 mg at 09/21/18 1201  . haloperidol (HALDOL) tablet 5 mg  5 mg Oral Q6H PRN Johnn Hai, MD   5 mg at 09/15/18 1846   Or  . haloperidol lactate (HALDOL) injection 10 mg  10 mg Intramuscular Q6H PRN Johnn Hai, MD      . haloperidol (HALDOL) tablet 5 mg  5 mg Oral Q6H PRN Sharma Covert, MD       Or  . haloperidol lactate (HALDOL) injection 10 mg  10 mg Intramuscular Q6H PRN Sharma Covert, MD   10 mg at 09/13/18 1703  . LORazepam (ATIVAN) tablet 1 mg  1 mg Oral Q6H PRN Johnn Hai, MD   1 mg at 09/20/18 2111   Or  . LORazepam (ATIVAN) injection 2 mg  2 mg Intramuscular Q4H PRN Johnn Hai, MD   2 mg at 09/03/18 0044  . LORazepam  (ATIVAN) tablet 1 mg  1 mg Oral BID Johnn Hai, MD   1 mg at 09/21/18 1018  . magnesium hydroxide (MILK OF MAGNESIA) suspension 30 mL  30 mL Oral Daily PRN Patrecia Pour, NP   30 mL at 09/11/18 1136  . multivitamin with minerals tablet 1 tablet  1 tablet Oral Daily [name], [name], MD   1 tablet at 09/21/18 1016  . pantoprazole (PROTONIX) EC tablet 40 mg  40 mg Oral BID Johnn Hai, MD   40 mg at 09/21/18 1017  . tuberculin injection 5 Units  5 Units Intradermal Once [name], John T, MD      . zolpidem (AMBIEN) tablet 5 mg  5 mg Oral QHS [name], [name], MD   5 mg at 09/20/18 2111   PTA Medications: Medications Prior to Admission  Medication Sig Dispense Refill Last Dose  . acetaminophen (TYLENOL) 500 MG tablet Take 500 mg by mouth every 6 (six) hours as needed for mild pain.     . benztropine (COGENTIN) 1 MG tablet Take 1 mg by mouth 2 (two) times daily.     . clopidogrel (PLAVIX) 75 MG tablet Take 1 tablet (75 mg total) by mouth daily. 30 tablet 0   .  cloZAPine (CLOZARIL) 100 MG tablet Take 1.5 tablets (150 mg total) by mouth 3 (three) times daily. At 8 AM, 5PM, 8PM per Dr. Darleene Cleaver (Patient taking differently: Take 100-200 mg by mouth 3 (three) times daily. One tablet (100 mg) At 8 AM, and 2PM, two tablets (200 mg) at bedtime per Dr. Darleene Cleaver)     . clozapine (CLOZARIL) 50 MG tablet Take 50 mg by mouth 2 (two) times daily. At 8 am and 2 pm     . diphenhydrAMINE (BENADRYL) 50 MG capsule Take 50 mg by mouth every 6 (six) hours as needed for allergies (sedation).      . haloperidol (HALDOL) 5 MG tablet Take 5 mg by mouth daily as needed (severe agitation).      . LORazepam (ATIVAN) 1 MG tablet Take 1 mg by mouth daily as needed (severe agitation).      . Maltodextrin-Xanthan Gum (RESOURCE THICKENUP CLEAR) POWD Take 120 g by mouth as needed. (Patient not taking: Reported on 04/07/2018) 1 Can 1   . traZODone (DESYREL) 100 MG tablet Take 100 mg by mouth at bedtime.     . [DISCONTINUED]  divalproex (DEPAKOTE) 500 MG DR tablet Take 1 tablet (500 mg total) by mouth 2 (two) times daily. 30 tablet 0     Patient Stressors: Financial difficulties Health problems Medication change or noncompliance  Patient Strengths: Motivation for treatment/growth Special hobby/interest Supportive family/friends  Treatment Modalities: Medication Management, Group therapy, Case management,  1 to 1 session with clinician, Psychoeducation, Recreational therapy.   Physician Treatment Plan for Primary Diagnosis: Schizoaffective disorder, bipolar type (Austin Gates) Long Term Goal(s): Improvement in symptoms so as ready for discharge Improvement in symptoms so as ready for discharge   Short Term Goals: Ability to identify and develop effective coping behaviors will improve Ability to maintain clinical measurements within normal limits will improve Compliance with prescribed medications will improve Ability to verbalize feelings will improve Ability to disclose and discuss suicidal ideas Ability to demonstrate self-control will improve Ability to identify and develop effective coping behaviors will improve  Medication Management: Evaluate patient's response, side effects, and tolerance of medication regimen.  Therapeutic Interventions: 1 to 1 sessions, Unit Group sessions and Medication administration.  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   Protein-calorie malnutrition, severe  Long Term Goal(s): Improvement in symptoms so as ready for discharge Improvement in symptoms so as ready for discharge   Short Term Goals: Ability to identify and develop effective coping behaviors will improve Ability to maintain clinical measurements within normal limits will improve Compliance with prescribed medications will improve Ability to  verbalize feelings will improve Ability to disclose and discuss suicidal ideas Ability to demonstrate self-control will improve Ability to identify and develop effective coping behaviors will improve     Medication Management: Evaluate patient's response, side effects, and tolerance of medication regimen.  Therapeutic Interventions: 1 to 1 sessions, Unit Group sessions and Medication administration.  Evaluation of Outcomes: Progressing   RN Treatment Plan for Primary Diagnosis: Schizoaffective disorder, bipolar type ([place]) Long Term Goal(s): Knowledge of disease and therapeutic regimen to maintain health will improve  Short Term Goals: Ability to demonstrate self-control, Ability to identify and develop effective coping behaviors will improve and Compliance with prescribed medications will improve  Medication Management: RN will administer medications as ordered by provider, will assess and evaluate patient's response and provide education  to patient for prescribed medication. RN will report any adverse and/or side effects to prescribing provider.  Therapeutic Interventions: 1 on 1 counseling sessions, Psychoeducation, Medication administration, Evaluate responses to treatment, Monitor vital signs and CBGs as ordered, Perform/monitor CIWA, COWS, AIMS and Fall Risk screenings as ordered, Perform wound care treatments as ordered.  Evaluation of Outcomes: Progressing   LCSW Treatment Plan for Primary Diagnosis: Schizoaffective disorder, bipolar type ([place]) Long Term Goal(s): Safe transition to appropriate next level of care at discharge, Engage patient in therapeutic group addressing interpersonal concerns.  Short Term Goals: Engage patient in aftercare planning with referrals and resources  Therapeutic Interventions: Assess for all discharge needs, 1 to 1 time with Social worker, Explore available resources and support systems, Assess for adequacy in community support network, Educate family  and significant other(s) on suicide prevention, Complete Psychosocial Assessment, Interpersonal group therapy.  Evaluation of Outcomes: Progressing   Progress in Treatment: Attending groups: Yes. Participating in groups: Yes. Taking medication as prescribed: Yes. Toleration medication: Yes. Family/Significant other contact made: Yes, individual(s) contacted:  [name], legal guardian Patient understands diagnosis: No. Discussing patient identified problems/goals with staff: No. Medical problems stabilized or resolved: Yes. Denies suicidal/homicidal ideation: Yes. Issues/concerns per patient self-inventory: No. Other: NA  New problem(s) identified: No, Describe:  none reported  New Short Term/Long Term Goal(s): Pt did not attend today's meeting. Nurse Tech reports the pt is sedated and resting in his room. Update 09/07/18: medication management for mood stabilization; development of comprehensive mental wellness plan.  Patient Goals:  Goal not obtained at this time. Update 09/07/18:  Patient reports goal is to "go to a nursing home".      Discharge Plan or Barriers: Pt will follow up at [facility] in [place] where he is an established patient.  Update 09/07/18:  Patient has an aftercare appointment scheduled with [facility] in [place].  Patient remains on the unit to assist with Care Coordinator and legal guardian in finding placement for the patient as the guardian has declined for the patient to return to his previous group home. Patient is at baseline for care. Patient has tentatively been approved for [facility] and placement was scheduled for 7/20, however, that may not happen.  Update 7/22:  Patient remains on the unit awaiting placement. Placement at [facility] was not successful and they denied the patient due to concerns that patient was high needs.  Care Coordinator continues to look for placement.  Patient has become aggressive with hospital  staff at this time. Update 09/17/18:  Patient remains on the unit awaiting placement. Patient has had one virtual meeting with a possible placement.  CSW is awaiting to here about that.  CSW and Care Coordinator are in the process of scheduling another interview with another group home. Update 09/21/18: Pt has been accepted to [place] group home with [facility]. QP Brittney will call CSW to schedule discharge and pick up date and let CSW what information she needs. Dr. Weber Cooks has been made aware and will order TB test for pt.  Reason for Continuation of Hospitalization: Medication stabilization  Estimated Length of Stay: TBD  Recreational Therapy: Patient: N/A Patient Goal: Patient will engage in groups without prompting or encouragement from LRT x3 group sessions within 5 recreation therapy group sessions   Attendees: Patient: 09/21/2018 1:00 PM  Physician: Dr. Weber Cooks, MD 09/21/2018 1:00 PM  Nursing:  09/21/2018 1:00 PM  RN Care Manager: 09/21/2018 1:00 PM  Social Worker:  [name]  LCSW 09/21/2018 1:00 PM  Recreational Therapist:  09/21/2018 1:00 PM  Other:  09/21/2018 1:00 PM  Other:  09/21/2018 1:00 PM  Other: 09/21/2018 1:00 PM    Scribe for Treatment Team: Mariann Laster Mickala Laton, LCSW 09/21/2018 1:00 PM

## 2018-09-21 NOTE — Progress Notes (Signed)
Clozapine Monitoring Note   07/17 [facility] 4600 07/24 [place] 5800 07/31 [place] 2400  Lab reported to clozapine registry Pt is eligible to receive clozapine Next labs due in one week per hospital policy on 7/02   Lu Duffel, PharmD, BCPS Clinical Pharmacist 09/21/2018 8:06 AM

## 2018-09-21 NOTE — Progress Notes (Signed)
[facility] MD Progress Note  09/21/2018 2:24 PM Austin Gates  MRN:  [id number] Subjective: Patient seen and chart reviewed.  Patient has no new complaints.  He has been cooperative and calm.  Tolerates medicine well.  We have gotten word that there is likely to be placement next week.  Tuberculosis skin test placed. 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 Hypertension   . Intellectual disability   . Obsessive-compulsive disorder   . Schizo-affective schizophrenia [facility])     Past Surgical History:  Procedure Laterality Date  . COLONOSCOPY WITH PROPOFOL N/A 12/28/2017   Procedure: COLONOSCOPY WITH PROPOFOL;  Surgeon: Jonathon Bellows, MD;  Location: [facility] ENDOSCOPY;  Service: Gastroenterology;  Laterality: N/A;  . HYDROCELE EXCISION Bilateral 02/22/2018   Procedure: bilateral HYDROCELECTOMY ADULT;  Surgeon: Cleon Gustin, MD;  Location: [place];  Service: Urology;  Laterality: Bilateral;  . INSERTION OF ILIAC STENT Left 02/22/2018   Procedure: INSERTION LEFT SUPERIOR FEMORAL ARTERY USING 6MM X 5CM VIABHON STENT with mynx device closure on right femoral artery;  Surgeon: Waynetta Sandy, MD;  Location: [place];  Service: Vascular;  Laterality: Left;  . KNEE ARTHROSCOPY    . SCROTAL EXPLORATION N/A 02/22/2018   Procedure: SCROTUM EXPLORATION;  Surgeon: Cleon Gustin, MD;  Location: [place];  Service: Urology;  Laterality: N/A;  . UPPER EXTREMITY ANGIOGRAM Left 02/22/2018   Procedure: left lower EXTREMITY ANGIOGRAM;  Surgeon: Waynetta Sandy, MD;  Location[facility] OR;  Service:  Vascular;  Laterality: Left;   Family History:  Family History  Problem Relation Age of Onset  . Diabetes Mother    Family Psychiatric  History: See previous Social History:  Social History   Substance and Sexual Activity  Alcohol Use No     Social History   Substance and Sexual Activity  Drug Use No    Social History   Socioeconomic History  . Marital status: Single    Spouse name: Not on file  . Number of children: Not on file  . Years of education: Not on file  . Highest education level: Not on file  Occupational History  . Occupation: Disabled  Social Needs  . Financial resource strain: Not on file  . Food insecurity    Worry: Not on file    Inability: Not on file  . Transportation needs    Medical: Not on file    Non-medical: Not on file  Tobacco Use  . Smoking status: Former Smoker    Types: Cigarettes  . Smokeless tobacco: Never Used  Substance and Sexual Activity  . Alcohol use: No  . Drug use: No  . Sexual activity: Never  Lifestyle  . Physical activity    Days per week: Not on file    Minutes per session: Not on file  . Stress: Not on file  Relationships  . Social Herbalist on phone: Not on file    Gets together: Not on file    Attends religious service: Not on file    Active member of club or organization: Not on file    Attends meetings of clubs or organizations: Not on file  Relationship status: Not on file  Other Topics Concern  . Not on file  Social History Narrative   ** Merged History Encounter **       Additional Social History:                         Sleep: Fair  Appetite:  Fair  Current Medications: Current Facility-Administered Medications  Medication Dose Route Frequency Provider Last Rate Last Dose  . acetaminophen (TYLENOL) tablet 650 mg  650 mg Oral Q6H PRN Patrecia Pour, NP   650 mg at 09/09/18 1253  . alum & mag hydroxide-simeth (MAALOX/MYLANTA) 200-200-20 MG/5ML suspension 30 mL  30 mL  Oral Q4H PRN Patrecia Pour, NP   30 mL at 09/20/18 1508  . atropine 1 % ophthalmic solution 2 drop  2 drop Sublingual QID Sharma Covert, MD   2 drop at 09/21/18 1202  . clopidogrel (PLAVIX) tablet 75 mg  75 mg Oral Daily Patrecia Pour, NP   75 mg at 09/21/18 1017  . cloZAPine (CLOZARIL) tablet 300 mg  300 mg Oral QHS Sharma Covert, MD   300 mg at 09/20/18 2111  . divalproex (DEPAKOTE) DR tablet 750 mg  750 mg Oral BID Sharma Covert, MD   750 mg at 09/21/18 1018  . gabapentin (NEURONTIN) capsule 300 mg  300 mg Oral TID Johnn Hai, MD   300 mg at 09/21/18 1201  . haloperidol (HALDOL) tablet 5 mg  5 mg Oral Q6H PRN Johnn Hai, MD   5 mg at 09/15/18 1846   Or  . haloperidol lactate (HALDOL) injection 10 mg  10 mg Intramuscular Q6H PRN Johnn Hai, MD      . haloperidol (HALDOL) tablet 5 mg  5 mg Oral Q6H PRN Sharma Covert, MD       Or  . haloperidol lactate (HALDOL) injection 10 mg  10 mg Intramuscular Q6H PRN Sharma Covert, MD   10 mg at 09/13/18 1703  . LORazepam (ATIVAN) tablet 1 mg  1 mg Oral Q6H PRN Johnn Hai, MD   1 mg at 09/20/18 2111   Or  . LORazepam (ATIVAN) injection 2 mg  2 mg Intramuscular Q4H PRN Johnn Hai, MD   2 mg at 09/03/18 0044  . LORazepam (ATIVAN) tablet 1 mg  1 mg Oral BID Johnn Hai, MD   1 mg at 09/21/18 1018  . magnesium hydroxide (MILK OF MAGNESIA) suspension 30 mL  30 mL Oral Daily PRN Patrecia Pour, NP   30 mL at 09/11/18 1136  . multivitamin with minerals tablet 1 tablet  1 tablet Oral Daily Sarenity Ramaker, [name], MD   1 tablet at 09/21/18 1016  . pantoprazole (PROTONIX) EC tablet 40 mg  40 mg Oral BID Johnn Hai, MD   40 mg at 09/21/18 1017  . tuberculin injection 5 Units  5 Units Intradermal Once Verlyn Lambert T, MD      . zolpidem (AMBIEN) tablet 5 mg  5 mg Oral QHS Emmajean Ratledge T, MD   5 mg at 09/20/18 2111    Lab Results:  Results for orders placed or performed during the hospital encounter of 08/31/18 (from the past 48  hour(s))  CBC with Differential/Platelet     Status: Abnormal   Collection Time: 09/21/18  6:58 AM  Result Value Ref Range   WBC 3.9 (L) 4.0 - 10.5 K/uL   RBC 3.57 (L) 4.22 - 5.81 MIL/uL  Hemoglobin 10.7 (L) 13.0 - 17.0 g/dL   HCT 33.8 (L) 39.0 - 52.0 %   MCV 94.7 80.0 - 100.0 fL   MCH 30.0 26.0 - 34.0 pg   MCHC 31.7 30.0 - 36.0 g/dL   RDW 14.3 11.5 - 15.5 %   Platelets 158 150 - 400 K/uL   nRBC 1.3 (H) 0.0 - 0.2 %   Neutrophils Relative % 61 %   Neutro Abs 2.4 1.7 - 7.7 K/uL   Lymphocytes Relative 23 %   Lymphs Abs 0.9 0.7 - 4.0 K/uL   Monocytes Relative 10 %   Monocytes Absolute 0.4 0.1 - 1.0 K/uL   Eosinophils Relative 3 %   Eosinophils Absolute 0.1 0.0 - 0.5 K/uL   Basophils Relative 1 %   Basophils Absolute 0.0 0.0 - 0.1 K/uL   Immature Granulocytes 2 %   Abs Immature Granulocytes 0.09 (H) 0.00 - 0.07 K/uL    Comment: Performed at [facility], [address] [place], [place] [address]    Blood Alcohol level:  Lab Results  Component Value Date   [facility] <10 08/31/2018   ETH <10 62/83/1517    Metabolic Disorder Labs: Lab Results  Component Value Date   HGBA1C 5.4 09/30/2016   MPG 108.28 09/30/2016   MPG 114 09/17/2016   No results found for: PROLACTIN Lab Results  Component Value Date   CHOL 135 09/30/2016   TRIG 104 02/25/2018   HDL 60 09/30/2016   CHOLHDL 2.3 09/30/2016   VLDL 12 09/30/2016   LDLCALC 63 09/30/2016    Physical Findings: AIMS: Facial and Oral Movements Muscles of Facial Expression: None, normal Lips and Perioral Area: None, normal Jaw: None, normal Tongue: None, normal,Extremity Movements Upper (arms, wrists, hands, fingers): None, normal Lower (legs, knees, ankles, toes): None, normal, Trunk Movements Neck, shoulders, hips: None, normal, Overall Severity Severity of abnormal movements (highest score from questions above): None, normal Incapacitation due to abnormal movements: None, normal Patient's awareness of abnormal  movements (rate only patient's report): No Awareness, Dental Status Current problems with teeth and/or dentures?: No Does patient usually wear dentures?: No  CIWA:  CIWA-Ar Total: 2 COWS:  COWS Total Score: 4  Musculoskeletal: Strength & Muscle Tone: within normal limits Gait & Station: normal Patient leans: N/A  Psychiatric Specialty Exam: Physical Exam  Nursing note and vitals reviewed. Constitutional: He appears well-developed and well-nourished.  HENT:  Head: Normocephalic and atraumatic.  Eyes: Pupils are equal, round, and reactive to light. Conjunctivae are normal.  Neck: Normal range of motion.  Cardiovascular: Regular rhythm and normal heart sounds.  Respiratory: Effort normal.  GI: Soft.  Musculoskeletal: Normal range of motion.  Neurological: He is alert.  Skin: Skin is warm and dry.  Psychiatric: His affect is blunt. His speech is delayed. He is slowed. Cognition and memory are impaired. He expresses no suicidal plans and no homicidal plans.    Review of Systems  Unable to perform ROS: Psychiatric disorder    Blood pressure 105/77, pulse 96, temperature 97.6 F (36.4 C), temperature source Oral, resp. rate 18, height 5\' 11"  (1.803 m), weight 67.1 kg, SpO2 98 %.Body mass index is 20.64 kg/m.  General Appearance: Casual  Eye Contact:  Good  Speech:  Slow  Volume:  Decreased  Mood:  Euthymic  Affect:  Constricted  Thought Process:  Disorganized  Orientation:  Full (Time, Place, and Person)  Thought Content:  Rumination  Suicidal Thoughts:  No  Homicidal Thoughts:  No  Memory:  Immediate;   Fair Recent;   Fair Remote;   Fair  Judgement:  Fair  Insight:  Fair  Psychomotor Activity:  Normal  Concentration:  Concentration: Fair  Recall:  [employer] of Knowledge:  Fair  Language:  Fair  Akathisia:  No  Handed:  Right  AIMS (if indicated):     Assets:  Desire for Improvement  ADL's:  Intact  Cognition:  Impaired,  Moderate  Sleep:  Number of Hours: 5      Treatment Plan Summary: Daily contact with patient to assess and evaluate symptoms and progress in treatment, Medication management and Plan No change to medication.  Continue current medicine.  Supportive therapy and encouragement.  Hope for discharge this upcoming week.  Alethia Berthold, MD 09/21/2018, 2:24 PM

## 2018-09-21 NOTE — Progress Notes (Signed)
CSW spoke with Pamala Hurry from [facility], she reported pt is still on the waiting list.  [name], MSW, LCSW Clinical Social Work 09/21/2018 11:37 AM

## 2018-09-21 NOTE — Plan of Care (Signed)
Visible in the milieu. Alert and oriented to person and place. Redirectable and follows commands. Denying hallucinations. Denying thoughts of self harm. Patient  is eating and sleeping well.

## 2018-09-21 NOTE — [facility] Group Notes (Signed)
LCSW Group Therapy Note  09/21/2018 1:00 PM  Type of Therapy and Topic:  Group Therapy:  Feelings around Relapse and Recovery  Participation Level:  Minimal   Description of Group:    Patients in this group will discuss emotions they experience before and after a relapse. They will process how experiencing these feelings, or avoidance of experiencing them, relates to having a relapse. Facilitator will guide patients to explore emotions they have related to recovery. Patients will be encouraged to process which emotions are more powerful. They will be guided to discuss the emotional reaction significant others in their lives may have to their relapse or recovery. Patients will be assisted in exploring ways to respond to the emotions of others without this contributing to a relapse.  Therapeutic Goals: 1. Patient will identify two or more emotions that lead to a relapse for them 2. Patient will identify two emotions that result when they relapse 3. Patient will identify two emotions related to recovery 4. Patient will demonstrate ability to communicate their needs through discussion and/or role plays   Summary of Patient Progress: Patient was present in group.  Patient was unable to process in group. Patient appeared preoccupied with the time and asked several times "What time is it?".    Therapeutic Modalities:   Cognitive Behavioral Therapy Solution-Focused Therapy Assertiveness Training Relapse Prevention Therapy   Assunta Curtis, MSW, LCSW 09/21/2018 11:17 AM

## 2018-09-22 NOTE — Progress Notes (Signed)
Tuberculin Purified Protein 5 units administered left fore arm , to be read 09/24/2018, tolerated well.

## 2018-09-22 NOTE — Plan of Care (Signed)
Visible in the milieu, cooperative and compliant with treatment. Alert and oriented. Thought process improving. No sign of distress noted.

## 2018-09-22 NOTE — Progress Notes (Signed)
D: Patient observed ambulating in the halls to go get his meals and water from the community room. Patient also observed in the dayroom watching TV, no interaction noted. States that his sleep last night was "ok". Rates his depression, hopelessness as being low and anxiety is rated at a 0. Denies pain, SI/HI/AVH at this time.  A:  Education provided on medication and treatment plans. Patient given support and encouragement to be proactive with his treatment plan.   R:  Patient continues to be monitored Q 15 minutes for safety per unit protocol. Patient remains safe on the unit

## 2018-09-22 NOTE — Progress Notes (Signed)
Patient is sleeping through the night  With out any issues and no falls , patient is  monitored  And redirected safely, medication is taken without any side effects , denies any suicidal or homicidal ideations , minimal socialization's with peers , support and encouragement is provided no distress noted frequent checks and 15 minutes rounding is maintained.

## 2018-09-22 NOTE — Progress Notes (Signed)
[facility] MD Progress Note  09/22/2018 1:57 PM Austin Gates  MRN:  [id number] Subjective: Patient seen chart reviewed.  Patient has no new complaints.  Behavior calm. 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 Hypertension   . Intellectual disability   . Obsessive-compulsive disorder   . Schizo-affective schizophrenia [facility])     Past Surgical History:  Procedure Laterality Date  . COLONOSCOPY WITH PROPOFOL N/A 12/28/2017   Procedure: COLONOSCOPY WITH PROPOFOL;  Surgeon: Jonathon Bellows, MD;  Location: [facility] ENDOSCOPY;  Service: Gastroenterology;  Laterality: N/A;  . HYDROCELE EXCISION Bilateral 02/22/2018   Procedure: bilateral HYDROCELECTOMY ADULT;  Surgeon: Cleon Gustin, MD;  Location: [place];  Service: Urology;  Laterality: Bilateral;  . INSERTION OF ILIAC STENT Left 02/22/2018   Procedure: INSERTION LEFT SUPERIOR FEMORAL ARTERY USING 6MM X 5CM VIABHON STENT with mynx device closure on right femoral artery;  Surgeon: Waynetta Sandy, MD;  Location: [place];  Service: Vascular;  Laterality: Left;  . KNEE ARTHROSCOPY    . SCROTAL EXPLORATION N/A 02/22/2018   Procedure: SCROTUM EXPLORATION;  Surgeon: Cleon Gustin, MD;  Location: [place];  Service: Urology;  Laterality: N/A;  . UPPER EXTREMITY ANGIOGRAM Left 02/22/2018   Procedure: left lower EXTREMITY ANGIOGRAM;  Surgeon: Waynetta Sandy, MD;  Location: [facility] OR;  Service: Vascular;  Laterality: Left;   Family History:  Family History  Problem Relation Age of Onset  . Diabetes Mother    Family Psychiatric  History: See  previous Social History:  Social History   Substance and Sexual Activity  Alcohol Use No     Social History   Substance and Sexual Activity  Drug Use No    Social History   Socioeconomic History  . Marital status: Single    Spouse name: Not on file  . Number of children: Not on file  . Years of education: Not on file  . Highest education level: Not on file  Occupational History  . Occupation: Disabled  Social Needs  . Financial resource strain: Not on file  . Food insecurity    Worry: Not on file    Inability: Not on file  . Transportation needs    Medical: Not on file    Non-medical: Not on file  Tobacco Use  . Smoking status: Former Smoker    Types: Cigarettes  . Smokeless tobacco: Never Used  Substance and Sexual Activity  . Alcohol use: No  . Drug use: No  . Sexual activity: Never  Lifestyle  . Physical activity    Days per week: Not on file    Minutes per session: Not on file  . Stress: Not on file  Relationships  . Social Herbalist on phone: Not on file    Gets together: Not on file    Attends religious service: Not on file    Active member of club or organization: Not on file    Attends meetings of clubs or organizations: Not on file    Relationship status: Not on file  Other Topics Concern  . Not on file  Social History Narrative   ** Merged History Encounter **  Additional Social History:                         Sleep: Fair  Appetite:  Fair  Current Medications: Current Facility-Administered Medications  Medication Dose Route Frequency Provider Last Rate Last Dose  . acetaminophen (TYLENOL) tablet 650 mg  650 mg Oral Q6H PRN Patrecia Pour, NP   650 mg at 09/09/18 1253  . alum & mag hydroxide-simeth (MAALOX/MYLANTA) 200-200-20 MG/5ML suspension 30 mL  30 mL Oral Q4H PRN Patrecia Pour, NP   30 mL at 09/20/18 1508  . atropine 1 % ophthalmic solution 2 drop  2 drop Sublingual QID Sharma Covert, MD   2 drop at  09/22/18 0802  . clopidogrel (PLAVIX) tablet 75 mg  75 mg Oral Daily Patrecia Pour, NP   75 mg at 09/22/18 0801  . cloZAPine (CLOZARIL) tablet 300 mg  300 mg Oral QHS Sharma Covert, MD   300 mg at 09/21/18 2146  . divalproex (DEPAKOTE) DR tablet 750 mg  750 mg Oral BID Sharma Covert, MD   750 mg at 09/22/18 0801  . gabapentin (NEURONTIN) capsule 300 mg  300 mg Oral TID Johnn Hai, MD   300 mg at 09/22/18 0801  . haloperidol (HALDOL) tablet 5 mg  5 mg Oral Q6H PRN Johnn Hai, MD   5 mg at 09/15/18 1846   Or  . haloperidol lactate (HALDOL) injection 10 mg  10 mg Intramuscular Q6H PRN Johnn Hai, MD      . haloperidol (HALDOL) tablet 5 mg  5 mg Oral Q6H PRN Sharma Covert, MD       Or  . haloperidol lactate (HALDOL) injection 10 mg  10 mg Intramuscular Q6H PRN Sharma Covert, MD   10 mg at 09/13/18 1703  . LORazepam (ATIVAN) tablet 1 mg  1 mg Oral Q6H PRN Johnn Hai, MD   1 mg at 09/20/18 2111   Or  . LORazepam (ATIVAN) injection 2 mg  2 mg Intramuscular Q4H PRN Johnn Hai, MD   2 mg at 09/03/18 0044  . LORazepam (ATIVAN) tablet 1 mg  1 mg Oral BID Johnn Hai, MD   1 mg at 09/22/18 0801  . magnesium hydroxide (MILK OF MAGNESIA) suspension 30 mL  30 mL Oral Daily PRN Patrecia Pour, NP   30 mL at 09/11/18 1136  . multivitamin with minerals tablet 1 tablet  1 tablet Oral Daily Ji Feldner, [name], MD   1 tablet at 09/22/18 0801  . pantoprazole (PROTONIX) EC tablet 40 mg  40 mg Oral BID Johnn Hai, MD   40 mg at 09/22/18 0801  . tuberculin injection 5 Units  5 Units Intradermal Once Sameera Betton, [name], MD   5 Units at 09/22/18 [phone]  . zolpidem (AMBIEN) tablet 5 mg  5 mg Oral QHS Augustino Savastano, [name], MD   5 mg at 09/21/18 2146    Lab Results:  Results for orders placed or performed during the hospital encounter of 08/31/18 (from the past 48 hour(s))  CBC with Differential/Platelet     Status: Abnormal   Collection Time: 09/21/18  6:58 AM  Result Value Ref Range   WBC 3.9  (L) 4.0 - 10.5 K/uL   RBC 3.57 (L) 4.22 - 5.81 MIL/uL   Hemoglobin 10.7 (L) 13.0 - 17.0 g/dL   HCT 33.8 (L) 39.0 - 52.0 %   MCV 94.7 80.0 - 100.0 fL   MCH  30.0 26.0 - 34.0 pg   MCHC 31.7 30.0 - 36.0 g/dL   RDW 14.3 11.5 - 15.5 %   Platelets 158 150 - 400 K/uL   nRBC 1.3 (H) 0.0 - 0.2 %   Neutrophils Relative % 61 %   Neutro Abs 2.4 1.7 - 7.7 K/uL   Lymphocytes Relative 23 %   Lymphs Abs 0.9 0.7 - 4.0 K/uL   Monocytes Relative 10 %   Monocytes Absolute 0.4 0.1 - 1.0 K/uL   Eosinophils Relative 3 %   Eosinophils Absolute 0.1 0.0 - 0.5 K/uL   Basophils Relative 1 %   Basophils Absolute 0.0 0.0 - 0.1 K/uL   Immature Granulocytes 2 %   Abs Immature Granulocytes 0.09 (H) 0.00 - 0.07 K/uL    Comment: Performed at [facility], [address] [place], [place] [address]    Blood Alcohol level:  Lab Results  Component Value Date   [place] <10 08/31/2018   ETH <10 83/15/1761    Metabolic Disorder Labs: Lab Results  Component Value Date   HGBA1C 5.4 09/30/2016   MPG 108.28 09/30/2016   MPG 114 09/17/2016   No results found for: PROLACTIN Lab Results  Component Value Date   CHOL 135 09/30/2016   TRIG 104 02/25/2018   HDL 60 09/30/2016   CHOLHDL 2.3 09/30/2016   VLDL 12 09/30/2016   LDLCALC 63 09/30/2016    Physical Findings: AIMS: Facial and Oral Movements Muscles of Facial Expression: None, normal Lips and Perioral Area: None, normal Jaw: None, normal Tongue: None, normal,Extremity Movements Upper (arms, wrists, hands, fingers): None, normal Lower (legs, knees, ankles, toes): None, normal, Trunk Movements Neck, shoulders, hips: None, normal, Overall Severity Severity of abnormal movements (highest score from questions above): None, normal Incapacitation due to abnormal movements: None, normal Patient's awareness of abnormal movements (rate only patient's report): No Awareness, Dental Status Current problems with teeth and/or dentures?: No Does patient usually  wear dentures?: No  CIWA:  CIWA-Ar Total: 2 COWS:  COWS Total Score: 4  Musculoskeletal: Strength & Muscle Tone: within normal limits Gait & Station: normal Patient leans: N/A  Psychiatric Specialty Exam: Physical Exam  Nursing note and vitals reviewed. Constitutional: He appears well-developed and well-nourished.  HENT:  Head: Normocephalic and atraumatic.  Eyes: Pupils are equal, round, and reactive to light. Conjunctivae are normal.  Neck: Normal range of motion.  Cardiovascular: Regular rhythm and normal heart sounds.  Respiratory: Effort normal. No respiratory distress.  GI: Soft.  Musculoskeletal: Normal range of motion.  Neurological: He is alert.  Skin: Skin is warm and dry.  Psychiatric: He has a normal mood and affect. His behavior is normal. Judgment and thought content normal.    Review of Systems  Constitutional: Negative.   HENT: Negative.   Eyes: Negative.   Respiratory: Negative.   Cardiovascular: Negative.   Gastrointestinal: Negative.   Musculoskeletal: Negative.   Skin: Negative.   Neurological: Negative.   Psychiatric/Behavioral: Negative.     Blood pressure (!) 122/92, pulse 84, temperature 97.7 F (36.5 C), temperature source Oral, resp. rate 18, height 5\' 11"  (1.803 m), weight 67.1 kg, SpO2 92 %.Body mass index is 20.64 kg/m.  General Appearance: Casual  Eye Contact:  Good  Speech:  Clear and Coherent  Volume:  Normal  Mood:  Euthymic  Affect:  Constricted  Thought Process:  Coherent  Orientation:  Full (Time, Place, and Person)  Thought Content:  Logical  Suicidal Thoughts:  No  Homicidal Thoughts:  No  Memory:  Immediate;   Fair Recent;   Fair Remote;   Fair  Judgement:  Fair  Insight:  Fair  Psychomotor Activity:  Normal  Concentration:  Concentration: Fair  Recall:  [employer] of Knowledge:  Fair  Language:  Fair  Akathisia:  No  Handed:  Right  AIMS (if indicated):     Assets:  Desire for Improvement  ADL's:  Intact   Cognition:  WNL  Sleep:  Number of Hours: 7.5     Treatment Plan Summary: Plan Continue current medication management.  Working on discharge [place], MD 09/22/2018, 1:57 PM

## 2018-09-22 NOTE — Plan of Care (Signed)
  Problem: Activity: Goal: Will identify at least one activity in which they can participate Outcome: Progressing   Problem: Coping: Goal: Ability to identify and develop effective coping behavior will improve Outcome: Progressing Goal: Ability to interact with others will improve Outcome: Progressing Goal: Demonstration of participation in decision-making regarding own care will improve Outcome: Progressing Goal: Ability to use eye contact when communicating with others will improve Outcome: Progressing   Problem: Health Behavior/Discharge Planning: Goal: Identification of resources available to assist in meeting health care needs will improve Outcome: Progressing   Problem: Self-Concept: Goal: Will verbalize positive feelings about self Outcome: Progressing   Problem: Education: Goal: Utilization of techniques to improve thought processes will improve Outcome: Progressing Goal: Knowledge of the prescribed therapeutic regimen will improve Outcome: Progressing   Problem: Activity: Goal: Interest or engagement in leisure activities will improve Outcome: Progressing Goal: Imbalance in normal sleep/wake cycle will improve Outcome: Progressing   Problem: Coping: Goal: Coping ability will improve Outcome: Progressing Goal: Will verbalize feelings Outcome: Progressing   Problem: Health Behavior/Discharge Planning: Goal: Ability to make decisions will improve Outcome: Progressing Goal: Compliance with therapeutic regimen will improve Outcome: Progressing   Problem: Role Relationship: Goal: Will demonstrate positive changes in social behaviors and relationships Outcome: Progressing   Problem: Safety: Goal: Ability to disclose and discuss suicidal ideas will improve Outcome: Progressing Goal: Ability to identify and utilize support systems that promote safety will improve Outcome: Progressing   Problem: Self-Concept: Goal: Will verbalize positive feelings about  self Outcome: Progressing Goal: Level of anxiety will decrease Outcome: Progressing   Problem: Education: Goal: Ability to verbalize precipitating factors for violent behavior will improve Outcome: Progressing   Problem: Coping: Goal: Ability to verbalize frustrations and anger appropriately will improve Outcome: Progressing   Problem: Health Behavior/Discharge Planning: Goal: Ability to implement measures to prevent violent behavior in the future will improve Outcome: Progressing   Problem: Safety: Goal: Ability to demonstrate self-control will improve Outcome: Progressing Goal: Ability to redirect hostility and anger into socially appropriate behaviors will improve Outcome: Progressing

## 2018-09-22 NOTE — [facility] Group Notes (Signed)
LCSW Group Therapy Note  09/22/2018 1:15pm  Type of Therapy and Topic:  Group Therapy:  Cognitive Distortions  Participation Level:  Active   Description of Group:    Patients in this group will be introduced to the topic of cognitive distortions.  Patients will identify and describe cognitive distortions, describe the feelings these distortions create for them.  Patients will identify one or more situations in their personal life where they have cognitively distorted thinking and will verbalize challenging this cognitive distortion through positive thinking skills.  Patients will practice the skill of using positive affirmations to challenge cognitive distortions using affirmation cards.    Therapeutic Goals:  1. Patient will identify two or more cognitive distortions they have used 2. Patient will identify one or more emotions that stem from use of a cognitive distortion 3. Patient will demonstrate use of a positive affirmation to counter a cognitive distortion through discussion and/or role play. 4. Patient will describe one way cognitive distortions can be detrimental to wellness   Summary of Patient Progress: The patient reported that he feels "good." Patients were introduced to the topic of cognitive distortions. The patient was able to identify and describe cognitive distortions, described the feelings these distortions create for him.  The patient shared he uses "personalization" often. Patient identified a situation in his personal life where he has cognitively distorted thinking and was able to verbalize and challenged this cognitive distortion through positive thinking skills. Patient was able to provide support and validation to other group members.     Therapeutic Modalities:   Cognitive Behavioral Therapy Motivational Interviewing   Elyn Krogh  CUEBAS-COLON, LCSW 09/22/2018 11:17 AM

## 2018-09-22 NOTE — Plan of Care (Signed)
Patient able to maintain control of anger at this time. Milieu remains safe at this time. No signs of incontinence at this time.

## 2018-09-23 NOTE — Progress Notes (Signed)
D: Patient observed ambulating in the halls to go get his meals and water from the community room. Patient went outside today during recreational therapy and was observed dancing.  Patient also observed in the dayroom watching TV , no interaction with peers noted. States that his sleep last night was good. Rates his depression, hopelessness as being low and anxiety is rated at a 0. Denies pain, SI/HI/AVH at this time.   A:  Education provided on medication and treatment plans. Patient given support and encouragement to be proactive with his treatment plan.   R:  Patient continues to be monitored Q 15 minutes for safety per unit protocol. Patient remains safe on the unit

## 2018-09-23 NOTE — Plan of Care (Signed)
Patient participates in group activities with active participation. Shows the ability to control his anger at this time. Milieu remains safe with q 15 minute safety checks.

## 2018-09-23 NOTE — Plan of Care (Signed)
Stayed in the milieu until bedtime. Cooperative. Continues to walk around frequently wanting to talk to staff but pleasant. No sign of distress. Had a snack and took medications voluntarily. Currently in bed sleeping soundly. Safety precautions maintained.

## 2018-09-23 NOTE — [facility] Group Notes (Signed)
LCSW Group Therapy Note 09/23/2018 1:15pm  Type of Therapy and Topic: Group Therapy: Feelings Around Returning Home & Establishing a Supportive Framework and Supporting Oneself When Supports Not Available  Participation Level: None  Description of Group:  Patients first processed thoughts and feelings about upcoming discharge. These included fears of upcoming changes, lack of change, new living environments, judgements and expectations from others and overall stigma of mental health issues. The group then discussed the definition of a supportive framework, what that looks and feels like, and how do to discern it from an unhealthy non-supportive network. The group identified different types of supports as well as what to do when your family/friends are less than helpful or unavailable  Therapeutic Goals  1. Patient will identify one healthy supportive network that they can use at discharge. 2. Patient will identify one factor of a supportive framework and how to tell it from an unhealthy network. 3. Patient able to identify one coping skill to use when they do not have positive supports from others. 4. Patient will demonstrate ability to communicate their needs through discussion and/or role plays.  Summary of Patient Progress:  Pt attended group, was in and out of the room several times and did not participate in group discussion.   Therapeutic Modalities Cognitive Behavioral Therapy Motivational Interviewing   Vennela Jutte  CUEBAS-COLON, LCSW 09/23/2018 12:05 PM

## 2018-09-23 NOTE — Progress Notes (Signed)
[facility] MD Progress Note  09/23/2018 1:36 PM Austin Gates  MRN:  [id number] Subjective: Patient seen and chart reviewed.  Patient with schizoaffective disorder and developmental disability.  He has no new complaints.  Patient continues to interact appropriately with peers and staff.  Eating normally.  Needs assistance to stay redirected to regular routine but basically taking care of his hygiene adequately.  No violence or threats. 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 Hypertension   . Intellectual disability   . Obsessive-compulsive disorder   . Schizo-affective schizophrenia [facility])     Past Surgical History:  Procedure Laterality Date  . COLONOSCOPY WITH PROPOFOL N/A 12/28/2017   Procedure: COLONOSCOPY WITH PROPOFOL;  Surgeon: Jonathon Bellows, MD;  Location: [facility];  Service: Gastroenterology;  Laterality: N/A;  . HYDROCELE EXCISION Bilateral 02/22/2018   Procedure: bilateral HYDROCELECTOMY ADULT;  Surgeon: Cleon Gustin, MD;  Location: [place];  Service: Urology;  Laterality: Bilateral;  . INSERTION OF ILIAC STENT Left 02/22/2018   Procedure: INSERTION LEFT SUPERIOR FEMORAL ARTERY USING 6MM X 5CM VIABHON STENT with mynx device closure on right femoral artery;  Surgeon: Waynetta Sandy, MD;  Location: [place];  Service: Vascular;  Laterality: Left;  . KNEE ARTHROSCOPY    . SCROTAL EXPLORATION N/A 02/22/2018   Procedure: SCROTUM EXPLORATION;  Surgeon: Cleon Gustin, MD;  Location: [place];  Service: Urology;  Laterality: N/A;   . UPPER EXTREMITY ANGIOGRAM Left 02/22/2018   Procedure: left lower EXTREMITY ANGIOGRAM;  Surgeon: Waynetta Sandy, MD;  Location: [facility] OR;  Service: Vascular;  Laterality: Left;   Family History:  Family History  Problem Relation Age of Onset  . Diabetes Mother    Family Psychiatric  History: See previous Social History:  Social History   Substance and Sexual Activity  Alcohol Use No     Social History   Substance and Sexual Activity  Drug Use No    Social History   Socioeconomic History  . Marital status: Single    Spouse name: Not on file  . Number of children: Not on file  . Years of education: Not on file  . Highest education level: Not on file  Occupational History  . Occupation: Disabled  Social Needs  . Financial resource strain: Not on file  . Food insecurity    Worry: Not on file    Inability: Not on file  . Transportation needs    Medical: Not on file    Non-medical: Not on file  Tobacco Use  . Smoking status: Former Smoker    Types: Cigarettes  . Smokeless tobacco: Never Used  Substance and Sexual Activity  . Alcohol use: No  . Drug use: No  . Sexual activity: Never  Lifestyle  . Physical activity    Days per week: Not on file    Minutes per session: Not on file  . Stress: Not on file  Relationships  . Social Herbalist on phone: Not on file    Gets together: Not on file    Attends religious service: Not on file    Active member of  club or organization: Not on file    Attends meetings of clubs or organizations: Not on file    Relationship status: Not on file  Other Topics Concern  . Not on file  Social History Narrative   ** Merged History Encounter **       Additional Social History:                         Sleep: Fair  Appetite:  Fair  Current Medications: Current Facility-Administered Medications  Medication Dose Route Frequency Provider Last Rate Last Dose  . acetaminophen (TYLENOL) tablet 650 mg   650 mg Oral Q6H PRN Patrecia Pour, NP   650 mg at 09/09/18 1253  . alum & mag hydroxide-simeth (MAALOX/MYLANTA) 200-200-20 MG/5ML suspension 30 mL  30 mL Oral Q4H PRN Patrecia Pour, NP   30 mL at 09/20/18 1508  . atropine 1 % ophthalmic solution 2 drop  2 drop Sublingual QID Sharma Covert, MD   2 drop at 09/23/18 1119  . clopidogrel (PLAVIX) tablet 75 mg  75 mg Oral Daily Patrecia Pour, NP   75 mg at 09/23/18 0746  . cloZAPine (CLOZARIL) tablet 300 mg  300 mg Oral QHS Sharma Covert, MD   300 mg at 09/22/18 2117  . divalproex (DEPAKOTE) DR tablet 750 mg  750 mg Oral BID Sharma Covert, MD   750 mg at 09/23/18 0746  . gabapentin (NEURONTIN) capsule 300 mg  300 mg Oral TID Johnn Hai, MD   300 mg at 09/23/18 1119  . haloperidol (HALDOL) tablet 5 mg  5 mg Oral Q6H PRN Johnn Hai, MD   5 mg at 09/15/18 1846   Or  . haloperidol lactate (HALDOL) injection 10 mg  10 mg Intramuscular Q6H PRN Johnn Hai, MD      . haloperidol (HALDOL) tablet 5 mg  5 mg Oral Q6H PRN Sharma Covert, MD       Or  . haloperidol lactate (HALDOL) injection 10 mg  10 mg Intramuscular Q6H PRN Sharma Covert, MD   10 mg at 09/13/18 1703  . LORazepam (ATIVAN) tablet 1 mg  1 mg Oral Q6H PRN Johnn Hai, MD   1 mg at 09/20/18 2111   Or  . LORazepam (ATIVAN) injection 2 mg  2 mg Intramuscular Q4H PRN Johnn Hai, MD   2 mg at 09/03/18 0044  . LORazepam (ATIVAN) tablet 1 mg  1 mg Oral BID Johnn Hai, MD   1 mg at 09/23/18 0746  . magnesium hydroxide (MILK OF MAGNESIA) suspension 30 mL  30 mL Oral Daily PRN Patrecia Pour, NP   30 mL at 09/11/18 1136  . multivitamin with minerals tablet 1 tablet  1 tablet Oral Daily , [name], MD   1 tablet at 09/23/18 0746  . pantoprazole (PROTONIX) EC tablet 40 mg  40 mg Oral BID Johnn Hai, MD   40 mg at 09/23/18 0747  . tuberculin injection 5 Units  5 Units Intradermal Once , [name], MD   5 Units at 09/22/18 [phone]  . zolpidem (AMBIEN) tablet 5 mg   5 mg Oral QHS , [name], MD   5 mg at 09/22/18 2117    Lab Results: No results found for this or any previous visit (from the past 48 hour(s)).  Blood Alcohol level:  Lab Results  Component Value Date   ETH <10 08/31/2018   ETH <10 08/29/2018  Metabolic Disorder Labs: Lab Results  Component Value Date   HGBA1C 5.4 09/30/2016   MPG 108.28 09/30/2016   MPG 114 09/17/2016   No results found for: PROLACTIN Lab Results  Component Value Date   CHOL 135 09/30/2016   TRIG 104 02/25/2018   HDL 60 09/30/2016   CHOLHDL 2.3 09/30/2016   VLDL 12 09/30/2016   LDLCALC 63 09/30/2016    Physical Findings: AIMS: Facial and Oral Movements Muscles of Facial Expression: None, normal Lips and Perioral Area: None, normal Jaw: None, normal Tongue: None, normal,Extremity Movements Upper (arms, wrists, hands, fingers): None, normal Lower (legs, knees, ankles, toes): None, normal, Trunk Movements Neck, shoulders, hips: None, normal, Overall Severity Severity of abnormal movements (highest score from questions above): None, normal Incapacitation due to abnormal movements: None, normal Patient's awareness of abnormal movements (rate only patient's report): No Awareness, Dental Status Current problems with teeth and/or dentures?: No Does patient usually wear dentures?: No  CIWA:  CIWA-Ar Total: 2 COWS:  COWS Total Score: 4  Musculoskeletal: Strength & Muscle Tone: within normal limits Gait & Station: normal Patient leans: N/A  Psychiatric Specialty Exam: Physical Exam  Nursing note and vitals reviewed. Constitutional: He appears well-developed and well-nourished.  HENT:  Head: Normocephalic and atraumatic.  Eyes: Pupils are equal, round, and reactive to light. Conjunctivae are normal.  Neck: Normal range of motion.  Cardiovascular: Regular rhythm and normal heart sounds.  Respiratory: Effort normal.  GI: Soft.  Musculoskeletal: Normal range of motion.  Neurological: He is  alert.  Skin: Skin is warm and dry.  Psychiatric: His affect is blunt. His speech is delayed. He is slowed. Cognition and memory are impaired.    Review of Systems  Unable to perform ROS: Psychiatric disorder    Blood pressure 116/82, pulse 92, temperature 97.9 F (36.6 C), temperature source Oral, resp. rate 18, height 5\' 11"  (1.803 m), weight 67.1 kg, SpO2 99 %.Body mass index is 20.64 kg/m.  General Appearance: Casual  Eye Contact:  Good  Speech:  Clear and Coherent  Volume:  Normal  Mood:  Euthymic  Affect:  Constricted  Thought Process:  Disorganized  Orientation:  Negative  Thought Content:  Negative  Suicidal Thoughts:  No  Homicidal Thoughts:  No  Memory:  Negative  Judgement:  Negative  Insight:  Negative  Psychomotor Activity:  Negative  Concentration:  Concentration: Negative  Recall:  Negative  Fund of Knowledge:  Negative  Language:  Negative  Akathisia:  No  Handed:  Right  AIMS (if indicated):     Assets:  Desire for Improvement  ADL's:  Impaired  Cognition:  Impaired,  Moderate  Sleep:  Number of Hours: 7.5     Treatment Plan Summary: Daily contact with patient to assess and evaluate symptoms and progress in treatment, Medication management and Plan Treatment team has a good lead on a discharge plan and we are hoping to find a group home by the middle of this next week.  No change to medication  Alethia Berthold, MD 09/23/2018, 1:36 PM

## 2018-09-23 NOTE — Progress Notes (Addendum)
Patient experienced urine incontinence this afternoon while napping. With the assistance of a staff member, patient showered and bed linen changed. Will continue to monitor. TB skin test administered on 09/22/18 at [place]. Results due to be read 09/24/18.

## 2018-09-24 NOTE — Progress Notes (Signed)
Recreation Therapy Notes  Date: 09/24/2018  Time: 9:30 am   Location: Craft room   Behavioral response: N/A   Intervention Topic: Necessities   Discussion/Intervention: Patient did not attend group.   Clinical Observations/Feedback:  Patient did not attend group.   Hadli Vandemark LRT/CTRS         Marysa Wessner 09/24/2018 10:37 AM

## 2018-09-24 NOTE — Plan of Care (Signed)
Patient present in the milieu ambulating with a steady gait. Attends groups with active participation. No distress noted.

## 2018-09-24 NOTE — Progress Notes (Signed)
[facility] MD Progress Note  09/24/2018 4:11 PM Austin Gates  MRN:  [id number] Subjective: Patient seen chart reviewed.  Patient has no new complaints.  Asked to speak to me several times today as usual but each time just wanted to ask when he was being discharged.  He has not been aggressive or violent.  He appears to be stable on his feet.  Appears to be tolerating medicine without difficulty.  Doing well without a one-to-one. 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 Hypertension   . Intellectual disability   . Obsessive-compulsive disorder   . Schizo-affective schizophrenia [facility])     Past Surgical History:  Procedure Laterality Date  . COLONOSCOPY WITH PROPOFOL N/A 12/28/2017   Procedure: COLONOSCOPY WITH PROPOFOL;  Surgeon: Jonathon Bellows, MD;  Location: [facility] ENDOSCOPY;  Service: Gastroenterology;  Laterality: N/A;  . HYDROCELE EXCISION Bilateral 02/22/2018   Procedure: bilateral HYDROCELECTOMY ADULT;  Surgeon: Cleon Gustin, MD;  Location: [place];  Service: Urology;  Laterality: Bilateral;  . INSERTION OF ILIAC STENT Left 02/22/2018   Procedure: INSERTION LEFT SUPERIOR FEMORAL ARTERY USING 6MM X 5CM VIABHON STENT with mynx device closure on right femoral artery;  Surgeon: Waynetta Sandy, MD;  Location: [place];  Service: Vascular;  Laterality: Left;  . KNEE ARTHROSCOPY    . SCROTAL EXPLORATION N/A 02/22/2018   Procedure: SCROTUM EXPLORATION;  Surgeon: Cleon Gustin, MD;  Location: [place];  Service: Urology;  Laterality: N/A;  . UPPER EXTREMITY ANGIOGRAM Left 02/22/2018   Procedure: left lower  EXTREMITY ANGIOGRAM;  Surgeon: Waynetta Sandy, MD;  Location: [facility] OR;  Service: Vascular;  Laterality: Left;   Family History:  Family History  Problem Relation Age of Onset  . Diabetes Mother    Family Psychiatric  History: See previous Social History:  Social History   Substance and Sexual Activity  Alcohol Use No     Social History   Substance and Sexual Activity  Drug Use No    Social History   Socioeconomic History  . Marital status: Single    Spouse name: Not on file  . Number of children: Not on file  . Years of education: Not on file  . Highest education level: Not on file  Occupational History  . Occupation: Disabled  Social Needs  . Financial resource strain: Not on file  . Food insecurity    Worry: Not on file    Inability: Not on file  . Transportation needs    Medical: Not on file    Non-medical: Not on file  Tobacco Use  . Smoking status: Former Smoker    Types: Cigarettes  . Smokeless tobacco: Never Used  Substance and Sexual Activity  . Alcohol use: No  . Drug use: No  . Sexual activity: Never  Lifestyle  . Physical activity    Days per week: Not on file    Minutes per session: Not on file  . Stress: Not on file  Relationships  . Social Herbalist on phone: Not on file    Gets together: Not on file    Attends religious service: Not on file    Active member of  club or organization: Not on file    Attends meetings of clubs or organizations: Not on file    Relationship status: Not on file  Other Topics Concern  . Not on file  Social History Narrative   ** Merged History Encounter **       Additional Social History:                         Sleep: Fair  Appetite:  Fair  Current Medications: Current Facility-Administered Medications  Medication Dose Route Frequency Provider Last Rate Last Dose  . acetaminophen (TYLENOL) tablet 650 mg  650 mg Oral Q6H PRN Patrecia Pour, NP   650 mg at 09/09/18 1253  .  alum & mag hydroxide-simeth (MAALOX/MYLANTA) 200-200-20 MG/5ML suspension 30 mL  30 mL Oral Q4H PRN Patrecia Pour, NP   30 mL at 09/20/18 1508  . atropine 1 % ophthalmic solution 2 drop  2 drop Sublingual QID Sharma Covert, MD   2 drop at 09/24/18 1138  . clopidogrel (PLAVIX) tablet 75 mg  75 mg Oral Daily Patrecia Pour, NP   75 mg at 09/24/18 2505  . cloZAPine (CLOZARIL) tablet 300 mg  300 mg Oral QHS Sharma Covert, MD   300 mg at 09/23/18 2151  . divalproex (DEPAKOTE) DR tablet 750 mg  750 mg Oral BID Sharma Covert, MD   750 mg at 09/24/18 0806  . gabapentin (NEURONTIN) capsule 300 mg  300 mg Oral TID Johnn Hai, MD   300 mg at 09/24/18 1139  . haloperidol (HALDOL) tablet 5 mg  5 mg Oral Q6H PRN Johnn Hai, MD   5 mg at 09/15/18 1846   Or  . haloperidol lactate (HALDOL) injection 10 mg  10 mg Intramuscular Q6H PRN Johnn Hai, MD      . haloperidol (HALDOL) tablet 5 mg  5 mg Oral Q6H PRN Sharma Covert, MD       Or  . haloperidol lactate (HALDOL) injection 10 mg  10 mg Intramuscular Q6H PRN Sharma Covert, MD   10 mg at 09/13/18 1703  . LORazepam (ATIVAN) tablet 1 mg  1 mg Oral Q6H PRN Johnn Hai, MD   1 mg at 09/20/18 2111   Or  . LORazepam (ATIVAN) injection 2 mg  2 mg Intramuscular Q4H PRN Johnn Hai, MD   2 mg at 09/03/18 0044  . LORazepam (ATIVAN) tablet 1 mg  1 mg Oral BID Johnn Hai, MD   1 mg at 09/24/18 0807  . magnesium hydroxide (MILK OF MAGNESIA) suspension 30 mL  30 mL Oral Daily PRN Patrecia Pour, NP   30 mL at 09/11/18 1136  . multivitamin with minerals tablet 1 tablet  1 tablet Oral Daily [name], [name], MD   1 tablet at 09/24/18 [phone]  . pantoprazole (PROTONIX) EC tablet 40 mg  40 mg Oral BID Johnn Hai, MD   40 mg at 09/24/18 7341  . zolpidem (AMBIEN) tablet 5 mg  5 mg Oral QHS [name], [name], MD   5 mg at 09/23/18 2151    Lab Results: No results found for this or any previous visit (from the past 60 hour(s)).  Blood Alcohol  level:  Lab Results  Component Value Date   [facility] <10 08/31/2018   ETH <10 93/79/0240    Metabolic Disorder Labs: Lab Results  Component Value Date   HGBA1C 5.4 09/30/2016   MPG 108.28 09/30/2016  MPG 114 09/17/2016   No results found for: PROLACTIN Lab Results  Component Value Date   CHOL 135 09/30/2016   TRIG 104 02/25/2018   HDL 60 09/30/2016   CHOLHDL 2.3 09/30/2016   VLDL 12 09/30/2016   LDLCALC 63 09/30/2016    Physical Findings: AIMS: Facial and Oral Movements Muscles of Facial Expression: None, normal Lips and Perioral Area: None, normal Jaw: None, normal Tongue: None, normal,Extremity Movements Upper (arms, wrists, hands, fingers): None, normal Lower (legs, knees, ankles, toes): None, normal, Trunk Movements Neck, shoulders, hips: None, normal, Overall Severity Severity of abnormal movements (highest score from questions above): None, normal Incapacitation due to abnormal movements: None, normal Patient's awareness of abnormal movements (rate only patient's report): No Awareness, Dental Status Current problems with teeth and/or dentures?: No Does patient usually wear dentures?: No  CIWA:  CIWA-Ar Total: 2 COWS:  COWS Total Score: 4  Musculoskeletal: Strength & Muscle Tone: within normal limits Gait & Station: normal Patient leans: N/A  Psychiatric Specialty Exam: Physical Exam  Nursing note and vitals reviewed. Constitutional: He appears well-developed and well-nourished.  HENT:  Head: Normocephalic and atraumatic.  Eyes: Pupils are equal, round, and reactive to light. Conjunctivae are normal.  Neck: Normal range of motion.  Cardiovascular: Regular rhythm and normal heart sounds.  Respiratory: Effort normal.  GI: Soft.  Musculoskeletal: Normal range of motion.  Neurological: He is alert.  Skin: Skin is warm and dry.  Psychiatric: His affect is blunt. His speech is delayed. He is slowed. Thought content is not paranoid. Cognition and memory are  impaired. He expresses impulsivity. He expresses no homicidal and no suicidal ideation.    Review of Systems  Constitutional: Negative.   HENT: Negative.   Eyes: Negative.   Respiratory: Negative.   Cardiovascular: Negative.   Gastrointestinal: Negative.   Musculoskeletal: Negative.   Skin: Negative.   Neurological: Negative.   Psychiatric/Behavioral: Negative.     Blood pressure 117/78, pulse 93, temperature 97.7 F (36.5 C), temperature source Oral, resp. rate 17, height 5\' 11"  (1.803 m), weight 67.1 kg, SpO2 99 %.Body mass index is 20.64 kg/m.  General Appearance: Casual  Eye Contact:  Good  Speech:  Garbled, Slow and Slurred  Volume:  Decreased  Mood:  Euthymic  Affect:  Constricted  Thought Process:  Disorganized  Orientation:  Negative  Thought Content:  Rumination  Suicidal Thoughts:  No  Homicidal Thoughts:  No  Memory:  Immediate;   Fair Recent;   Fair Remote;   Fair  Judgement:  Fair  Insight:  Fair  Psychomotor Activity:  Normal  Concentration:  Concentration: Fair  Recall:  [employer] of Knowledge:  Fair  Language:  Fair  Akathisia:  No  Handed:  Right  AIMS (if indicated):     Assets:  Desire for Improvement Housing Physical Health  ADL's:  Intact  Cognition:  Impaired,  Moderate  Sleep:  Number of Hours: 8     Treatment Plan Summary: Daily contact with patient to assess and evaluate symptoms and progress in treatment, Medication management and Plan Once again Lanny Hurst appears to be quite stable.  Able to take care of his needs without much fuss on the unit environment.  Given his current behavior would appear to be quite safe for discharge to a structured living facility.  We are hoping for discharge to come through within the next couple days.  No change to medicine  Alethia Berthold, MD 09/24/2018, 4:11 PM

## 2018-09-24 NOTE — Progress Notes (Signed)
D: Patient observed ambulating the halls to go get his meals and medications. Patient continues to ask for food throughout the day even right after meals. Observed napping throughout the day and continues to be incontinent when sleeping, incontinence care provided. Denies pain, SI/HI/AVH at this time.  A: Encouraged patient participation with unit programming. Education provided on medication and treatment plans. Patient given support and encouragement to be proactive with his treatment plan.   R:  Patient continues to be monitored Q 15 minutes for safety per unit protocol. Patient remains safe on the unit

## 2018-09-24 NOTE — [facility] Group Notes (Signed)
LCSW Group Therapy Note   09/24/2018 11:49 AM   Type of Therapy and Topic:  Group Therapy:  Overcoming Obstacles   Participation Level:  Did Not Attend   Description of Group:    In this group patients will be encouraged to explore what they see as obstacles to their own wellness and recovery. They will be guided to discuss their thoughts, feelings, and behaviors related to these obstacles. The group will process together ways to cope with barriers, with attention given to specific choices patients can make. Each patient will be challenged to identify changes they are motivated to make in order to overcome their obstacles. This group will be process-oriented, with patients participating in exploration of their own experiences as well as giving and receiving support and challenge from other group members.   Therapeutic Goals: 1. Patient will identify personal and current obstacles as they relate to admission. 2. Patient will identify barriers that currently interfere with their wellness or overcoming obstacles.  3. Patient will identify feelings, thought process and behaviors related to these barriers. 4. Patient will identify two changes they are willing to make to overcome these obstacles:      Summary of Patient Progress x     Therapeutic Modalities:   Cognitive Behavioral Therapy Solution Focused Therapy Motivational Interviewing Relapse Prevention Therapy  [name], MSW, LCSW Clinical Social Work 09/24/2018 11:49 AM

## 2018-09-25 NOTE — Progress Notes (Signed)
Recreation Therapy Notes    Date: 09/25/2018  Time: 9:30 am  Location: Craft room  Behavioral response: Appropriate   Intervention Topic: Stress   Discussion/Intervention:  Group content on today was focused on stress. The group defined stress and way to cope with stress. Participants expressed how they know when they are stresses out. Individuals described the different ways they have to cope with stress. The group stated reasons why it is important to cope with stress. Patient explained what good stress is and some examples. The group participated in the intervention "Stress Management". Individuals explored different ways to manage stress.  Clinical Observations/Feedback:  Patient came to group.  Keyanah Kozicki LRT/CTRS          Lezlee Gills 09/25/2018 10:50 AM

## 2018-09-25 NOTE — Progress Notes (Signed)
[facility] MD Progress Note  09/25/2018 3:07 PM Austin Gates  MRN:  [id number] Subjective: Patient seen and chart reviewed.  No change to clinical presentation.  No behavior problems.  Remains intrusive but redirectable.  Not violent not threatening not agitated.  No new physical complaints.  Patient has been reminded that we still do not have a specific date for admission to a new group home but are working on it. 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 Hypertension   . Intellectual disability   . Obsessive-compulsive disorder   . Schizo-affective schizophrenia [facility])     Past Surgical History:  Procedure Laterality Date  . COLONOSCOPY WITH PROPOFOL N/A 12/28/2017   Procedure: COLONOSCOPY WITH PROPOFOL;  Surgeon: Jonathon Bellows, MD;  Location: [facility] ENDOSCOPY;  Service: Gastroenterology;  Laterality: N/A;  . HYDROCELE EXCISION Bilateral 02/22/2018   Procedure: bilateral HYDROCELECTOMY ADULT;  Surgeon: Cleon Gustin, MD;  Location: [place];  Service: Urology;  Laterality: Bilateral;  . INSERTION OF ILIAC STENT Left 02/22/2018   Procedure: INSERTION LEFT SUPERIOR FEMORAL ARTERY USING 6MM X 5CM VIABHON STENT with mynx device closure on right femoral artery;  Surgeon: Waynetta Sandy, MD;  Location: [place];  Service: Vascular;  Laterality: Left;  . KNEE ARTHROSCOPY    . SCROTAL EXPLORATION N/A 02/22/2018   Procedure: SCROTUM EXPLORATION;  Surgeon: Cleon Gustin, MD;  Location: [place];  Service: Urology;  Laterality: N/A;  . UPPER EXTREMITY ANGIOGRAM Left 02/22/2018   Procedure: left lower  EXTREMITY ANGIOGRAM;  Surgeon: Waynetta Sandy, MD;  Location: [facility] OR;  Service: Vascular;  Laterality: Left;   Family History:  Family History  Problem Relation Age of Onset  . Diabetes Mother    Family Psychiatric  History: See previous Social History:  Social History   Substance and Sexual Activity  Alcohol Use No     Social History   Substance and Sexual Activity  Drug Use No    Social History   Socioeconomic History  . Marital status: Single    Spouse name: Not on file  . Number of children: Not on file  . Years of education: Not on file  . Highest education level: Not on file  Occupational History  . Occupation: Disabled  Social Needs  . Financial resource strain: Not on file  . Food insecurity    Worry: Not on file    Inability: Not on file  . Transportation needs    Medical: Not on file    Non-medical: Not on file  Tobacco Use  . Smoking status: Former Smoker    Types: Cigarettes  . Smokeless tobacco: Never Used  Substance and Sexual Activity  . Alcohol use: No  . Drug use: No  . Sexual activity: Never  Lifestyle  . Physical activity    Days per week: Not on file    Minutes per session: Not on file  . Stress: Not on file  Relationships  . Social Herbalist on phone: Not on file    Gets together: Not on file    Attends religious service: Not on file    Active member of club or organization: Not  on file    Attends meetings of clubs or organizations: Not on file    Relationship status: Not on file  Other Topics Concern  . Not on file  Social History Narrative   ** Merged History Encounter **       Additional Social History:                         Sleep: Fair  Appetite:  Fair  Current Medications: Current Facility-Administered Medications  Medication Dose Route Frequency Provider Last Rate Last Dose  . acetaminophen (TYLENOL) tablet 650 mg  650 mg Oral Q6H PRN Patrecia Pour, NP   650 mg at 09/09/18 1253  .  alum & mag hydroxide-simeth (MAALOX/MYLANTA) 200-200-20 MG/5ML suspension 30 mL  30 mL Oral Q4H PRN Patrecia Pour, NP   30 mL at 09/20/18 1508  . atropine 1 % ophthalmic solution 2 drop  2 drop Sublingual QID Sharma Covert, MD   2 drop at 09/25/18 1235  . clopidogrel (PLAVIX) tablet 75 mg  75 mg Oral Daily Patrecia Pour, NP   75 mg at 09/25/18 3419  . cloZAPine (CLOZARIL) tablet 300 mg  300 mg Oral QHS Sharma Covert, MD   300 mg at 09/24/18 2107  . divalproex (DEPAKOTE) DR tablet 750 mg  750 mg Oral BID Sharma Covert, MD   750 mg at 09/25/18 0803  . gabapentin (NEURONTIN) capsule 300 mg  300 mg Oral TID Johnn Hai, MD   300 mg at 09/25/18 1315  . haloperidol (HALDOL) tablet 5 mg  5 mg Oral Q6H PRN Johnn Hai, MD   5 mg at 09/15/18 1846   Or  . haloperidol lactate (HALDOL) injection 10 mg  10 mg Intramuscular Q6H PRN Johnn Hai, MD      . haloperidol (HALDOL) tablet 5 mg  5 mg Oral Q6H PRN Sharma Covert, MD       Or  . haloperidol lactate (HALDOL) injection 10 mg  10 mg Intramuscular Q6H PRN Sharma Covert, MD   10 mg at 09/13/18 1703  . LORazepam (ATIVAN) tablet 1 mg  1 mg Oral Q6H PRN Johnn Hai, MD   1 mg at 09/20/18 2111   Or  . LORazepam (ATIVAN) injection 2 mg  2 mg Intramuscular Q4H PRN Johnn Hai, MD   2 mg at 09/03/18 0044  . LORazepam (ATIVAN) tablet 1 mg  1 mg Oral BID Johnn Hai, MD   1 mg at 09/25/18 6222  . magnesium hydroxide (MILK OF MAGNESIA) suspension 30 mL  30 mL Oral Daily PRN Patrecia Pour, NP   30 mL at 09/11/18 1136  . multivitamin with minerals tablet 1 tablet  1 tablet Oral Daily Aaliyah Cancro, [name], MD   1 tablet at 09/25/18 0803  . pantoprazole (PROTONIX) EC tablet 40 mg  40 mg Oral BID Johnn Hai, MD   40 mg at 09/25/18 9798  . zolpidem (AMBIEN) tablet 5 mg  5 mg Oral QHS Shaleta Ruacho, [name], MD   5 mg at 09/24/18 2108    Lab Results: No results found for this or any previous visit (from the past 48 hour(s)).  Blood Alcohol  level:  Lab Results  Component Value Date   [facility] <10 08/31/2018   ETH <10 92/12/9415    Metabolic Disorder Labs: Lab Results  Component Value Date   HGBA1C 5.4 09/30/2016   MPG 108.28 09/30/2016   MPG 114 09/17/2016  No results found for: PROLACTIN Lab Results  Component Value Date   CHOL 135 09/30/2016   TRIG 104 02/25/2018   HDL 60 09/30/2016   CHOLHDL 2.3 09/30/2016   VLDL 12 09/30/2016   LDLCALC 63 09/30/2016    Physical Findings: AIMS: Facial and Oral Movements Muscles of Facial Expression: None, normal Lips and Perioral Area: None, normal Jaw: None, normal Tongue: None, normal,Extremity Movements Upper (arms, wrists, hands, fingers): None, normal Lower (legs, knees, ankles, toes): None, normal, Trunk Movements Neck, shoulders, hips: None, normal, Overall Severity Severity of abnormal movements (highest score from questions above): None, normal Incapacitation due to abnormal movements: None, normal Patient's awareness of abnormal movements (rate only patient's report): No Awareness, Dental Status Current problems with teeth and/or dentures?: No Does patient usually wear dentures?: No  CIWA:  CIWA-Ar Total: 2 COWS:  COWS Total Score: 4  Musculoskeletal: Strength & Muscle Tone: within normal limits Gait & Station: normal Patient leans: N/A  Psychiatric Specialty Exam: Physical Exam  Nursing note and vitals reviewed. Constitutional: He appears well-developed and well-nourished.  HENT:  Head: Normocephalic and atraumatic.  Eyes: Pupils are equal, round, and reactive to light. Conjunctivae are normal.  Neck: Normal range of motion.  Cardiovascular: Normal heart sounds.  Respiratory: Effort normal.  GI: Soft.  Musculoskeletal: Normal range of motion.  Neurological: He is alert.  Skin: Skin is warm and dry.  Psychiatric: His affect is blunt. His speech is delayed. He is slowed. Cognition and memory are impaired. He expresses impulsivity. He expresses no  homicidal and no suicidal ideation.    Review of Systems  Constitutional: Negative.   HENT: Negative.   Eyes: Negative.   Respiratory: Negative.   Cardiovascular: Negative.   Gastrointestinal: Negative.   Musculoskeletal: Negative.   Skin: Negative.   Neurological: Negative.   Psychiatric/Behavioral: Negative.     Blood pressure 117/72, pulse (!) 106, temperature 98.3 F (36.8 C), temperature source Oral, resp. rate 18, height 5\' 11"  (1.803 m), weight 67.1 kg, SpO2 100 %.Body mass index is 20.64 kg/m.  General Appearance: Casual  Eye Contact:  Fair  Speech:  Clear and Coherent  Volume:  Normal  Mood:  Euthymic  Affect:  Congruent  Thought Process:  Goal Directed  Orientation:  Full (Time, Place, and Person)  Thought Content:  Logical  Suicidal Thoughts:  No  Homicidal Thoughts:  No  Memory:  Immediate;   Fair Recent;   Fair Remote;   Fair  Judgement:  Fair  Insight:  Fair  Psychomotor Activity:  Normal  Concentration:  Concentration: Fair  Recall:  [employer] of Knowledge:  Fair  Language:  Fair  Akathisia:  No  Handed:  Right  AIMS (if indicated):     Assets:  Desire for Improvement  ADL's:  Intact  Cognition:  WNL  Sleep:  Number of Hours: 8     Treatment Plan Summary: Medication management and Plan No change to current plan for medication management.  Patient will be discharged to a group home as soon as facility available  Alethia Berthold, MD 09/25/2018, 3:07 PM

## 2018-09-25 NOTE — Progress Notes (Signed)
Patient has been calm and stable,, follows directions and maintaining safety, no falls in this shift, medication compliance with out any side effects,  Patient is quiet sitting among his peer with out any issues , slandered nursing safety is maintained, patient denies any SI/HI/AVH only requires 15 minutes safety check is in progress and  frequent  Checks for ADLs, no distress noted.

## 2018-09-25 NOTE — [facility] Group Notes (Signed)
LCSW Group Therapy Note  09/25/2018 1:00 PM  Type of Therapy/Topic:  Group Therapy:  Feelings about Diagnosis  Participation Level:  Minimal   Description of Group:   This group will allow patients to explore their thoughts and feelings about diagnoses they have received. Patients will be guided to explore their level of understanding and acceptance of these diagnoses. Facilitator will encourage patients to process their thoughts and feelings about the reactions of others to their diagnosis and will guide patients in identifying ways to discuss their diagnosis with significant others in their lives. This group will be process-oriented, with patients participating in exploration of their own experiences, giving and receiving support, and processing challenge from other group members.   Therapeutic Goals: 1. Patient will demonstrate understanding of diagnosis as evidenced by identifying two or more symptoms of the disorder 2. Patient will be able to express two feelings regarding the diagnosis 3. Patient will demonstrate their ability to communicate their needs through discussion and/or role play  Summary of Patient Progress: Patient was present in group.  Patient appeared attentive while in group.  Patient did not contribute to group discussions. CSW notes that patient did not ask what time it was or when group is over, a change from his normal behaviors in group.    Therapeutic Modalities:   Cognitive Behavioral Therapy Brief Therapy Feelings Identification   Assunta Curtis, MSW, LCSW 09/25/2018 10:36 AM

## 2018-09-25 NOTE — Plan of Care (Signed)
Pt denies depression, anxiety , SI, HI and AVH. the patient was educated on care plans and verbalizes understanding. [name] RN Problem: Activity: Goal: Will identify at least one activity in which they can participate Outcome: Progressing   Problem: Coping: Goal: Ability to identify and develop effective coping behavior will improve Outcome: Progressing Goal: Ability to interact with others will improve Outcome: Progressing Goal: Demonstration of participation in decision-making regarding own care will improve Outcome: Progressing Goal: Ability to use eye contact when communicating with others will improve Outcome: Progressing   Problem: Health Behavior/Discharge Planning: Goal: Identification of resources available to assist in meeting health care needs will improve Outcome: Progressing   Problem: Self-Concept: Goal: Will verbalize positive feelings about self Outcome: Progressing   Problem: Education: Goal: Utilization of techniques to improve thought processes will improve Outcome: Progressing Goal: Knowledge of the prescribed therapeutic regimen will improve Outcome: Progressing   Problem: Activity: Goal: Interest or engagement in leisure activities will improve Outcome: Progressing Goal: Imbalance in normal sleep/wake cycle will improve Outcome: Progressing   Problem: Coping: Goal: Coping ability will improve Outcome: Progressing Goal: Will verbalize feelings Outcome: Progressing   Problem: Health Behavior/Discharge Planning: Goal: Ability to make decisions will improve Outcome: Progressing Goal: Compliance with therapeutic regimen will improve Outcome: Progressing   Problem: Role Relationship: Goal: Will demonstrate positive changes in social behaviors and relationships Outcome: Progressing   Problem: Safety: Goal: Ability to disclose and discuss suicidal ideas will improve Outcome: Progressing Goal: Ability to identify and utilize support systems  that promote safety will improve Outcome: Progressing   Problem: Self-Concept: Goal: Will verbalize positive feelings about self Outcome: Progressing Goal: Level of anxiety will decrease Outcome: Progressing   Problem: Education: Goal: Ability to verbalize precipitating factors for violent behavior will improve Outcome: Progressing   Problem: Coping: Goal: Ability to verbalize frustrations and anger appropriately will improve Outcome: Progressing   Problem: Health Behavior/Discharge Planning: Goal: Ability to implement measures to prevent violent behavior in the future will improve Outcome: Progressing   Problem: Safety: Goal: Ability to demonstrate self-control will improve Outcome: Progressing Goal: Ability to redirect hostility and anger into socially appropriate behaviors will improve Outcome: Progressing

## 2018-09-26 ENCOUNTER — Inpatient Hospital Stay: Payer: Medicaid Other

## 2018-09-26 DIAGNOSIS — R1310 Dysphagia, unspecified: Secondary | ICD-10-CM

## 2018-09-26 NOTE — Tx Team (Signed)
Interdisciplinary Treatment and Diagnostic Plan Update  09/26/2018 Time of Session: 8:30AM Austin Gates MRN: [id number]  Principal Diagnosis: Schizoaffective disorder, bipolar type [facility])  Secondary Diagnoses: Principal Problem:   Schizoaffective disorder, bipolar type ([place]) Active Problems:   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   Protein-calorie malnutrition, severe   Current Medications:  Current Facility-Administered Medications  Medication Dose Route Frequency Provider Last Rate Last Dose  . acetaminophen (TYLENOL) tablet 650 mg  650 mg Oral Q6H PRN Patrecia Pour, NP   650 mg at 09/09/18 1253  . alum & mag hydroxide-simeth (MAALOX/MYLANTA) 200-200-20 MG/5ML suspension 30 mL  30 mL Oral Q4H PRN Patrecia Pour, NP   30 mL at 09/20/18 1508  . atropine 1 % ophthalmic solution 2 drop  2 drop Sublingual QID Sharma Covert, MD   2 drop at 09/26/18 [phone]  . clopidogrel (PLAVIX) tablet 75 mg  75 mg Oral Daily Patrecia Pour, NP   75 mg at 09/26/18 0815  . cloZAPine (CLOZARIL) tablet 300 mg  300 mg Oral QHS Sharma Covert, MD   300 mg at 09/25/18 2121  . divalproex (DEPAKOTE) DR tablet 750 mg  750 mg Oral BID Sharma Covert, MD   750 mg at 09/26/18 3235  . gabapentin (NEURONTIN) capsule 300 mg  300 mg Oral TID Johnn Hai, MD   300 mg at 09/26/18 5732  . haloperidol (HALDOL) tablet 5 mg  5 mg Oral Q6H PRN Johnn Hai, MD   5 mg at 09/15/18 1846   Or  . haloperidol lactate (HALDOL) injection 10 mg  10 mg Intramuscular Q6H PRN Johnn Hai, MD      . haloperidol (HALDOL) tablet 5 mg  5 mg Oral Q6H PRN Sharma Covert, MD       Or  . haloperidol lactate (HALDOL) injection 10 mg  10 mg Intramuscular Q6H PRN Sharma Covert, MD   10 mg at 09/13/18 1703  . LORazepam (ATIVAN) tablet 1 mg  1 mg Oral Q6H PRN Johnn Hai, MD   1 mg at 09/20/18 2111   Or  . LORazepam (ATIVAN) injection 2 mg  2 mg Intramuscular Q4H PRN Johnn Hai, MD   2 mg at 09/03/18 0044  . LORazepam  (ATIVAN) tablet 1 mg  1 mg Oral BID Johnn Hai, MD   1 mg at 09/26/18 0815  . magnesium hydroxide (MILK OF MAGNESIA) suspension 30 mL  30 mL Oral Daily PRN Patrecia Pour, NP   30 mL at 09/11/18 1136  . multivitamin with minerals tablet 1 tablet  1 tablet Oral Daily [name], [name], MD   1 tablet at 09/26/18 [phone]  . pantoprazole (PROTONIX) EC tablet 40 mg  40 mg Oral BID Johnn Hai, MD   40 mg at 09/26/18 4270  . zolpidem (AMBIEN) tablet 5 mg  5 mg Oral QHS [name], [name], MD   5 mg at 09/25/18 2121   PTA Medications: Medications Prior to Admission  Medication Sig Dispense Refill Last Dose  . acetaminophen (TYLENOL) 500 MG tablet Take 500 mg by mouth every 6 (six) hours as needed for mild pain.     . benztropine (COGENTIN) 1 MG tablet Take 1 mg by mouth 2 (two) times daily.     . clopidogrel (PLAVIX) 75 MG tablet Take 1 tablet (75 mg total) by mouth daily. 30 tablet 0   . cloZAPine (CLOZARIL) 100 MG tablet Take 1.5 tablets (150 mg total) by mouth 3 (three) times daily.  At 8 AM, 5PM, 8PM per Dr. Darleene Cleaver (Patient taking differently: Take 100-200 mg by mouth 3 (three) times daily. One tablet (100 mg) At 8 AM, and 2PM, two tablets (200 mg) at bedtime per Dr. Darleene Cleaver)     . clozapine (CLOZARIL) 50 MG tablet Take 50 mg by mouth 2 (two) times daily. At 8 am and 2 pm     . diphenhydrAMINE (BENADRYL) 50 MG capsule Take 50 mg by mouth every 6 (six) hours as needed for allergies (sedation).      . haloperidol (HALDOL) 5 MG tablet Take 5 mg by mouth daily as needed (severe agitation).      . LORazepam (ATIVAN) 1 MG tablet Take 1 mg by mouth daily as needed (severe agitation).      . Maltodextrin-Xanthan Gum (RESOURCE THICKENUP CLEAR) POWD Take 120 g by mouth as needed. (Patient not taking: Reported on 04/07/2018) 1 Can 1   . traZODone (DESYREL) 100 MG tablet Take 100 mg by mouth at bedtime.     . [DISCONTINUED] divalproex (DEPAKOTE) 500 MG DR tablet Take 1 tablet (500 mg total) by mouth 2 (two) times  daily. 30 tablet 0     Patient Stressors: Financial difficulties Health problems Medication change or noncompliance  Patient Strengths: Motivation for treatment/growth Special hobby/interest Supportive family/friends  Treatment Modalities: Medication Management, Group therapy, Case management,  1 to 1 session with clinician, Psychoeducation, Recreational therapy.   Physician Treatment Plan for Primary Diagnosis: Schizoaffective disorder, bipolar type ([place]) Long Term Goal(s): Improvement in symptoms so as ready for discharge Improvement in symptoms so as ready for discharge   Short Term Goals: Ability to identify and develop effective coping behaviors will improve Ability to maintain clinical measurements within normal limits will improve Compliance with prescribed medications will improve Ability to verbalize feelings will improve Ability to disclose and discuss suicidal ideas Ability to demonstrate self-control will improve Ability to identify and develop effective coping behaviors will improve  Medication Management: Evaluate patient's response, side effects, and tolerance of medication regimen.  Therapeutic Interventions: 1 to 1 sessions, Unit Group sessions and Medication administration.  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   Protein-calorie malnutrition, severe  Long Term Goal(s): Improvement in symptoms so as ready for discharge Improvement in symptoms so as ready for discharge   Short Term Goals: Ability to identify and develop effective coping behaviors will improve Ability to maintain clinical measurements within normal limits will improve Compliance with prescribed medications will improve Ability to verbalize feelings will improve Ability to disclose and discuss suicidal ideas Ability to  demonstrate self-control will improve Ability to identify and develop effective coping behaviors will improve     Medication Management: Evaluate patient's response, side effects, and tolerance of medication regimen.  Therapeutic Interventions: 1 to 1 sessions, Unit Group sessions and Medication administration.  Evaluation of Outcomes: Progressing   RN Treatment Plan for Primary Diagnosis: Schizoaffective disorder, bipolar type ([place]) Long Term Goal(s): Knowledge of disease and therapeutic regimen to maintain health will improve  Short Term Goals: Ability to demonstrate self-control, Ability to identify and develop effective coping behaviors will improve and Compliance with prescribed medications will improve  Medication Management: RN will administer medications as ordered by provider, will assess and evaluate patient's response and provide education to patient for prescribed medication. RN will report any adverse and/or side effects to prescribing provider.  Therapeutic Interventions: 1 on 1 counseling sessions, Psychoeducation, Medication administration, Evaluate responses to treatment, Monitor vital signs and CBGs as ordered, Perform/monitor CIWA, COWS, AIMS and Fall Risk screenings as ordered, Perform wound care treatments as ordered.  Evaluation of Outcomes: Progressing   LCSW Treatment Plan for Primary Diagnosis: Schizoaffective disorder, bipolar type ([place]) Long Term Goal(s): Safe transition to appropriate next level of care at discharge, Engage patient in therapeutic group addressing interpersonal concerns.  Short Term Goals: Engage patient in aftercare planning with referrals and resources  Therapeutic Interventions: Assess for all discharge needs, 1 to 1 time with Social worker, Explore available resources and support systems, Assess for adequacy in community support network, Educate family and significant other(s) on suicide prevention, Complete Psychosocial Assessment,  Interpersonal group therapy.  Evaluation of Outcomes: Progressing   Progress in Treatment: Attending groups: Yes. Participating in groups: Yes. Taking medication as prescribed: Yes. Toleration medication: Yes. Family/Significant other contact made: Yes, individual(s) contacted:  [name], legal guardian Patient understands diagnosis: No. Discussing patient identified problems/goals with staff: No. Medical problems stabilized or resolved: Yes. Denies suicidal/homicidal ideation: Yes. Issues/concerns per patient self-inventory: No. Other: NA  New problem(s) identified: No, Describe:  none reported  New Short Term/Long Term Goal(s): Pt did not attend today's meeting. Nurse Tech reports the pt is sedated and resting in his room. Update 09/07/18: medication management for mood stabilization; development of comprehensive mental wellness plan.  Patient Goals:  Goal not obtained at this time. Update 09/07/18:  Patient reports goal is to "go to a nursing home".      Discharge Plan or Barriers: Pt will follow up at [facility] in [place] where he is an established patient.  Update 09/07/18:  Patient has an aftercare appointment scheduled with [facility] in [place].  Patient remains on the unit to assist with Care Coordinator and legal guardian in finding placement for the patient as the guardian has declined for the patient to return to his previous group home. Patient is at baseline for care. Patient has tentatively been approved for [facility] and placement was scheduled for 7/20, however, that may not happen.  Update 7/22:  Patient remains on the unit awaiting placement. Placement at [facility] was not successful and they denied the patient due to concerns that patient was high needs.  Care Coordinator continues to look for placement.  Patient has become aggressive with hospital staff at this time. Update 09/17/18:  Patient remains on the unit awaiting placement.  Patient has had one virtual meeting with a possible placement.  CSW is awaiting to here about that.  CSW and Care Coordinator are in the process of scheduling another interview with another group home. Update 09/21/18: Pt has been accepted to [place] group home with [facility]. QP Brittney will call CSW to schedule discharge and pick up date and let CSW what information she needs. Dr. Weber Cooks has been made aware and will order TB test for pt. Update 09/26/2018:  Patient remains on the unit.  Patient's care coordinator Zipporah Plants, has identified placement for the patient at [facility] and guardian has approved.  At this time they are waiting on paperwork and for [facility] to complete a walk-through of the facility for authorization purposes. Earliest possible discharge is 10/01/2018.  Reason for Continuation of Hospitalization: Medication stabilization  Estimated Length of Stay: TBD  Recreational Therapy: Patient: N/A Patient Goal: Patient will engage in groups without prompting or encouragement from LRT x3 group sessions within 5  recreation therapy group sessions   Attendees: Patient: 09/26/2018 11:11 AM  Physician: Dr. Weber Cooks, MD 09/26/2018 11:11 AM  Nursing:  09/26/2018 11:11 AM  RN Care Manager: 09/26/2018 11:11 AM  Social Worker:  Assunta Curtis LCSW 09/26/2018 11:11 AM  Recreational Therapist:  09/26/2018 11:11 AM  Other:  09/26/2018 11:11 AM  Other:  09/26/2018 11:11 AM  Other: 09/26/2018 11:11 AM    Scribe for Treatment Team: [name], LCSW 09/26/2018 11:11 AM

## 2018-09-26 NOTE — [facility] Counselor (Signed)
CSW called [facility] to inform that patient remains on the unit and in need of placement.   Assunta Curtis, MSW, LCSW 09/26/2018 11:19 AM

## 2018-09-26 NOTE — Progress Notes (Signed)
[facility] MD Progress Note  09/26/2018 3:00 PM Austin Gates  MRN:  [id number] Subjective: Follow-up for patient with schizoaffective disorder and developmental disability.  Patient has no new complaints.  He was able to at least basically comprehend when I explained to him that his group home would not be ready until next week.  Patient's affect and behavior have been calm.  Much thanks to speech therapy for suggesting a reevaluation of his swallowing.  Unfortunately it appears that his swallowing is still impaired to the point that he has silent aspiration and it is recommended that he remain on thick liquids going forward.  I will make sure we add that to his problem list. 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 Hypertension   . Intellectual disability   . Obsessive-compulsive disorder   . Schizo-affective schizophrenia [facility])     Past Surgical History:  Procedure Laterality Date  . COLONOSCOPY WITH PROPOFOL N/A 12/28/2017   Procedure: COLONOSCOPY WITH PROPOFOL;  Surgeon: Jonathon Bellows, MD;  Location: [facility] ENDOSCOPY;  Service: Gastroenterology;  Laterality: N/A;  . HYDROCELE EXCISION Bilateral 02/22/2018   Procedure: bilateral HYDROCELECTOMY ADULT;  Surgeon: Cleon Gustin, MD;  Location: [place];  Service: Urology;  Laterality: Bilateral;  . INSERTION OF ILIAC STENT Left 02/22/2018   Procedure: INSERTION LEFT SUPERIOR FEMORAL ARTERY USING 6MM X 5CM VIABHON STENT with mynx device closure on right femoral artery;  Surgeon: Waynetta Sandy, MD;  Location: [place];   Service: Vascular;  Laterality: Left;  . KNEE ARTHROSCOPY    . SCROTAL EXPLORATION N/A 02/22/2018   Procedure: SCROTUM EXPLORATION;  Surgeon: Cleon Gustin, MD;  Location: [place];  Service: Urology;  Laterality: N/A;  . UPPER EXTREMITY ANGIOGRAM Left 02/22/2018   Procedure: left lower EXTREMITY ANGIOGRAM;  Surgeon: Waynetta Sandy, MD;  Location: [facility] OR;  Service: Vascular;  Laterality: Left;   Family History:  Family History  Problem Relation Age of Onset  . Diabetes Mother    Family Psychiatric  History: See previous Social History:  Social History   Substance and Sexual Activity  Alcohol Use No     Social History   Substance and Sexual Activity  Drug Use No    Social History   Socioeconomic History  . Marital status: Single    Spouse name: Not on file  . Number of children: Not on file  . Years of education: Not on file  . Highest education level: Not on file  Occupational History  . Occupation: Disabled  Social Needs  . Financial resource strain: Not on file  . Food insecurity    Worry: Not on file    Inability: Not on file  . Transportation needs    Medical: Not on file    Non-medical: Not on file  Tobacco Use  . Smoking status: Former Smoker    Types: Cigarettes  . Smokeless tobacco: Never Used  Substance and Sexual Activity  . Alcohol use: No  . Drug use: No  . Sexual activity: Never  Lifestyle  . Physical activity    Days per week: Not on file    Minutes per session: Not on file  .  Stress: Not on file  Relationships  . Social Herbalist on phone: Not on file    Gets together: Not on file    Attends religious service: Not on file    Active member of club or organization: Not on file    Attends meetings of clubs or organizations: Not on file    Relationship status: Not on file  Other Topics Concern  . Not on file  Social History Narrative   ** Merged History Encounter **       Additional Social History:                          Sleep: Fair  Appetite:  Fair  Current Medications: Current Facility-Administered Medications  Medication Dose Route Frequency Provider Last Rate Last Dose  . acetaminophen (TYLENOL) tablet 650 mg  650 mg Oral Q6H PRN Patrecia Pour, NP   650 mg at 09/09/18 1253  . alum & mag hydroxide-simeth (MAALOX/MYLANTA) 200-200-20 MG/5ML suspension 30 mL  30 mL Oral Q4H PRN Patrecia Pour, NP   30 mL at 09/20/18 1508  . atropine 1 % ophthalmic solution 2 drop  2 drop Sublingual QID Sharma Covert, MD   2 drop at 09/26/18 1140  . clopidogrel (PLAVIX) tablet 75 mg  75 mg Oral Daily Patrecia Pour, NP   75 mg at 09/26/18 0815  . cloZAPine (CLOZARIL) tablet 300 mg  300 mg Oral QHS Sharma Covert, MD   300 mg at 09/25/18 2121  . divalproex (DEPAKOTE) DR tablet 750 mg  750 mg Oral BID Sharma Covert, MD   750 mg at 09/26/18 2876  . gabapentin (NEURONTIN) capsule 300 mg  300 mg Oral TID Johnn Hai, MD   300 mg at 09/26/18 1140  . haloperidol (HALDOL) tablet 5 mg  5 mg Oral Q6H PRN Johnn Hai, MD   5 mg at 09/15/18 1846   Or  . haloperidol lactate (HALDOL) injection 10 mg  10 mg Intramuscular Q6H PRN Johnn Hai, MD      . haloperidol (HALDOL) tablet 5 mg  5 mg Oral Q6H PRN Sharma Covert, MD       Or  . haloperidol lactate (HALDOL) injection 10 mg  10 mg Intramuscular Q6H PRN Sharma Covert, MD   10 mg at 09/13/18 1703  . LORazepam (ATIVAN) tablet 1 mg  1 mg Oral Q6H PRN Johnn Hai, MD   1 mg at 09/20/18 2111   Or  . LORazepam (ATIVAN) injection 2 mg  2 mg Intramuscular Q4H PRN Johnn Hai, MD   2 mg at 09/03/18 0044  . LORazepam (ATIVAN) tablet 1 mg  1 mg Oral BID Johnn Hai, MD   1 mg at 09/26/18 0815  . magnesium hydroxide (MILK OF MAGNESIA) suspension 30 mL  30 mL Oral Daily PRN Patrecia Pour, NP   30 mL at 09/11/18 1136  . multivitamin with minerals tablet 1 tablet  1 tablet Oral Daily Markeem Noreen, [name], MD   1 tablet at 09/26/18 [id number]  . pantoprazole  (PROTONIX) EC tablet 40 mg  40 mg Oral BID Johnn Hai, MD   40 mg at 09/26/18 7262  . zolpidem (AMBIEN) tablet 5 mg  5 mg Oral QHS Yusra Ravert, [name], MD   5 mg at 09/25/18 2121    Lab Results: No results found for this or any previous visit (from the past 31 hour(s)).  Blood  Alcohol level:  Lab Results  Component Value Date   ETH <10 08/31/2018   ETH <10 59/56/3875    Metabolic Disorder Labs: Lab Results  Component Value Date   HGBA1C 5.4 09/30/2016   MPG 108.28 09/30/2016   MPG 114 09/17/2016   No results found for: PROLACTIN Lab Results  Component Value Date   CHOL 135 09/30/2016   TRIG 104 02/25/2018   HDL 60 09/30/2016   CHOLHDL 2.3 09/30/2016   VLDL 12 09/30/2016   LDLCALC 63 09/30/2016    Physical Findings: AIMS: Facial and Oral Movements Muscles of Facial Expression: None, normal Lips and Perioral Area: None, normal Jaw: None, normal Tongue: None, normal,Extremity Movements Upper (arms, wrists, hands, fingers): None, normal Lower (legs, knees, ankles, toes): None, normal, Trunk Movements Neck, shoulders, hips: None, normal, Overall Severity Severity of abnormal movements (highest score from questions above): None, normal Incapacitation due to abnormal movements: None, normal Patient's awareness of abnormal movements (rate only patient's report): No Awareness, Dental Status Current problems with teeth and/or dentures?: No Does patient usually wear dentures?: No  CIWA:  CIWA-Ar Total: 2 COWS:  COWS Total Score: 4  Musculoskeletal: Strength & Muscle Tone: within normal limits Gait & Station: normal Patient leans: N/A  Psychiatric Specialty Exam: Physical Exam  Nursing note and vitals reviewed. Constitutional: He appears well-developed and well-nourished.  HENT:  Head: Normocephalic and atraumatic.  Eyes: Pupils are equal, round, and reactive to light. Conjunctivae are normal.  Neck: Normal range of motion.  Cardiovascular: Regular rhythm and normal  heart sounds.  Respiratory: Effort normal.  GI: Soft.  Musculoskeletal: Normal range of motion.  Neurological: He is alert.  Skin: Skin is warm and dry.  Psychiatric: His affect is blunt. His speech is delayed. He is slowed. Cognition and memory are impaired. He expresses impulsivity. He expresses no homicidal and no suicidal ideation.    Review of Systems  Constitutional: Negative.   HENT: Negative.   Eyes: Negative.   Respiratory: Negative.   Cardiovascular: Negative.   Gastrointestinal: Negative.   Musculoskeletal: Negative.   Skin: Negative.   Neurological: Negative.   Psychiatric/Behavioral: Negative for depression, hallucinations, memory loss, substance abuse and suicidal ideas. The patient is not nervous/anxious and does not have insomnia.     Blood pressure 117/72, pulse (!) 106, temperature 98.3 F (36.8 C), temperature source Oral, resp. rate 18, height 5\' 11"  (1.803 m), weight 67.1 kg, SpO2 100 %.Body mass index is 20.64 kg/m.  General Appearance: Casual  Eye Contact:  Good  Speech:  Slow and Slurred  Volume:  Decreased  Mood:  Euthymic  Affect:  Constricted  Thought Process:  Disorganized  Orientation:  Full (Time, Place, and Person)  Thought Content:  Illogical and Rumination  Suicidal Thoughts:  No  Homicidal Thoughts:  No  Memory:  Immediate;   Fair Recent;   Poor Remote;   Poor  Judgement:  Fair  Insight:  Shallow  Psychomotor Activity:  Normal  Concentration:  Concentration: Fair  Recall:  [employer] of Knowledge:  Fair  Language:  Fair  Akathisia:  No  Handed:  Right  AIMS (if indicated):     Assets:  Desire for Improvement Social Support  ADL's:  Impaired  Cognition:  Impaired,  Moderate  Sleep:  Number of Hours: 7.75     Treatment Plan Summary: Daily contact with patient to assess and evaluate symptoms and progress in treatment, Medication management and Plan No change to medication.  Supportive therapy and oriented patient  to treatment  plan.  Provided support and praise for his behavior.  Exchanged messages with speech therapy confirming the ongoing need for thick liquid diet.  We are hoping for discharge by sometime next week  Alethia Berthold, MD 09/26/2018, 3:00 PM

## 2018-09-26 NOTE — Progress Notes (Signed)
Recreation Therapy Notes    Date: 09/26/2018  Time: 9:30 am  Location: Craft room  Behavioral response: Not Engaged  Intervention Topic: Values  Discussion/Intervention:  Group content today was focused on values. The group identified what values are and where they come from. Individuals expressed some values and how many they have. Patients described how they go about add or removing values. The group described the importance of having values and how they go about using them in daily life. Patient participated in the intervention "My Values" where they were able to pick out values that were important to them and made a visual aide.  Clinical Observations/Feedback:  Patient came to group stared at peers and staff during group. No contributions were provide towards the group session. Individual left group early due to unknown reasons and never returned.  Nandi Tonnesen LRT/CTRS         Felice Hope 09/26/2018 11:11 AM

## 2018-09-26 NOTE — BHH Counselor (Signed)
CSW received email from [facility].  E-mail reports that they are completing paperwork with the guardian so that a Client-Specific Contract for [facility] can be completed.  They are also awaiting authorization from [employer] regarding the placement. E-mail indicates that earliest possible date for transfer is 10/01/2018.  Assunta Curtis, MSW, LCSW 09/26/2018 11:18 AM

## 2018-09-26 NOTE — Progress Notes (Signed)
Patient has shown much  Improvement in  safety and his gait , no falls. Contract for safety, denies any SI,HI.AVH., sleep is maintained , patient is responding appropriately to treatment regimen, staff observation is continued . 15 minutes safety checks is in progress.
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
## 2018-09-26 NOTE — Plan of Care (Signed)
Pt denies depression, anxiety, SI, HI and AVH. Pt was educated on care plan and verbalizes understanding. [name] RN Problem: Activity: Goal: Will identify at least one activity in which they can participate Outcome: Progressing   Problem: Coping: Goal: Ability to identify and develop effective coping behavior will improve Outcome: Progressing Goal: Ability to interact with others will improve Outcome: Progressing Goal: Demonstration of participation in decision-making regarding own care will improve Outcome: Progressing Goal: Ability to use eye contact when communicating with others will improve Outcome: Progressing   Problem: Health Behavior/Discharge Planning: Goal: Identification of resources available to assist in meeting health care needs will improve Outcome: Progressing   Problem: Self-Concept: Goal: Will verbalize positive feelings about self Outcome: Progressing   Problem: Education: Goal: Utilization of techniques to improve thought processes will improve Outcome: Progressing Goal: Knowledge of the prescribed therapeutic regimen will improve Outcome: Progressing   Problem: Activity: Goal: Interest or engagement in leisure activities will improve Outcome: Progressing Goal: Imbalance in normal sleep/wake cycle will improve Outcome: Progressing   Problem: Coping: Goal: Coping ability will improve Outcome: Progressing Goal: Will verbalize feelings Outcome: Progressing   Problem: Health Behavior/Discharge Planning: Goal: Ability to make decisions will improve Outcome: Progressing Goal: Compliance with therapeutic regimen will improve Outcome: Progressing   Problem: Role Relationship: Goal: Will demonstrate positive changes in social behaviors and relationships Outcome: Progressing   Problem: Safety: Goal: Ability to disclose and discuss suicidal ideas will improve Outcome: Progressing Goal: Ability to identify and utilize support systems that promote  safety will improve Outcome: Progressing   Problem: Self-Concept: Goal: Will verbalize positive feelings about self Outcome: Progressing Goal: Level of anxiety will decrease Outcome: Progressing   Problem: Education: Goal: Ability to verbalize precipitating factors for violent behavior will improve Outcome: Progressing   Problem: Coping: Goal: Ability to verbalize frustrations and anger appropriately will improve Outcome: Progressing   Problem: Health Behavior/Discharge Planning: Goal: Ability to implement measures to prevent violent behavior in the future will improve Outcome: Progressing   Problem: Safety: Goal: Ability to demonstrate self-control will improve Outcome: Progressing Goal: Ability to redirect hostility and anger into socially appropriate behaviors will improve Outcome: Progressing

## 2018-09-26 NOTE — [facility] Group Notes (Signed)
LCSW Group Therapy Note  09/26/2018 11:32 AM  Type of Therapy/Topic:  Group Therapy:  Emotion Regulation  Participation Level:  Minimal   Description of Group:   The purpose of this group is to assist patients in learning to regulate negative emotions and experience positive emotions. Patients will be guided to discuss ways in which they have been vulnerable to their negative emotions. These vulnerabilities will be juxtaposed with experiences of positive emotions or situations, and patients will be challenged to use positive emotions to combat negative ones. Special emphasis will be placed on coping with negative emotions in conflict situations, and patients will process healthy conflict resolution skills.  Therapeutic Goals: 1. Patient will identify two positive emotions or experiences to reflect on in order to balance out negative emotions 2. Patient will label two or more emotions that they find the most difficult to experience 3. Patient will demonstrate positive conflict resolution skills through discussion and/or role plays  Summary of Patient Progress: Pt was present in group. Pt repeated what other group members said after they said it to answer questions and did not provide any insight on his own to the group discussion.   Therapeutic Modalities:   Cognitive Behavioral Therapy Feelings Identification Dialectical Behavioral Therapy   [name], MSW, LCSW Clinical Social Work 09/26/2018 11:32 AM
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
## 2018-09-27 NOTE — Plan of Care (Signed)
Pt denies depression, anxiety, SI, HI and AVS. Pt was educated on care plan and verbalizes understanding. [name] RN Problem: Activity: Goal: Will identify at least one activity in which they can participate Outcome: Progressing   Problem: Coping: Goal: Ability to identify and develop effective coping behavior will improve Outcome: Progressing Goal: Ability to interact with others will improve Outcome: Progressing Goal: Demonstration of participation in decision-making regarding own care will improve Outcome: Progressing Goal: Ability to use eye contact when communicating with others will improve Outcome: Progressing   Problem: Health Behavior/Discharge Planning: Goal: Identification of resources available to assist in meeting health care needs will improve Outcome: Progressing   Problem: Self-Concept: Goal: Will verbalize positive feelings about self Outcome: Progressing   Problem: Education: Goal: Utilization of techniques to improve thought processes will improve Outcome: Progressing Goal: Knowledge of the prescribed therapeutic regimen will improve Outcome: Progressing   Problem: Activity: Goal: Interest or engagement in leisure activities will improve Outcome: Progressing Goal: Imbalance in normal sleep/wake cycle will improve Outcome: Progressing   Problem: Coping: Goal: Coping ability will improve Outcome: Progressing Goal: Will verbalize feelings Outcome: Progressing   Problem: Health Behavior/Discharge Planning: Goal: Ability to make decisions will improve Outcome: Progressing Goal: Compliance with therapeutic regimen will improve Outcome: Progressing   Problem: Role Relationship: Goal: Will demonstrate positive changes in social behaviors and relationships Outcome: Progressing   Problem: Safety: Goal: Ability to disclose and discuss suicidal ideas will improve Outcome: Progressing Goal: Ability to identify and utilize support systems that promote  safety will improve Outcome: Progressing   Problem: Self-Concept: Goal: Will verbalize positive feelings about self Outcome: Progressing Goal: Level of anxiety will decrease Outcome: Progressing   Problem: Education: Goal: Ability to verbalize precipitating factors for violent behavior will improve Outcome: Progressing   Problem: Coping: Goal: Ability to verbalize frustrations and anger appropriately will improve Outcome: Progressing   Problem: Health Behavior/Discharge Planning: Goal: Ability to implement measures to prevent violent behavior in the future will improve Outcome: Progressing   Problem: Safety: Goal: Ability to demonstrate self-control will improve Outcome: Progressing Goal: Ability to redirect hostility and anger into socially appropriate behaviors will improve Outcome: Progressing

## 2018-09-27 NOTE — Progress Notes (Signed)
[facility] MD Progress Note  09/27/2018 4:37 PM Austin Gates  MRN:  [id number] Subjective: Patient seen chart reviewed.  No new complaints.  No new behavior problems.  Ask me 3 or 4 times today whether he would be going home on Monday.  Clarified to him multiple times that while there was a possibility of Monday it was more likely to be later in the week.  Patient took the news with equanimity.  Physically appears to be stable.  No obvious aspiration. 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 Hypertension   . Intellectual disability   . Obsessive-compulsive disorder   . Schizo-affective schizophrenia [facility])     Past Surgical History:  Procedure Laterality Date  . COLONOSCOPY WITH PROPOFOL N/A 12/28/2017   Procedure: COLONOSCOPY WITH PROPOFOL;  Surgeon: Jonathon Bellows, MD;  Location: [facility] ENDOSCOPY;  Service: Gastroenterology;  Laterality: N/A;  . HYDROCELE EXCISION Bilateral 02/22/2018   Procedure: bilateral HYDROCELECTOMY ADULT;  Surgeon: Cleon Gustin, MD;  Location: [place];  Service: Urology;  Laterality: Bilateral;  . INSERTION OF ILIAC STENT Left 02/22/2018   Procedure: INSERTION LEFT SUPERIOR FEMORAL ARTERY USING 6MM X 5CM VIABHON STENT with mynx device closure on right femoral artery;  Surgeon: Waynetta Sandy, MD;  Location: [place];  Service: Vascular;  Laterality: Left;  . KNEE ARTHROSCOPY    . SCROTAL EXPLORATION N/A 02/22/2018   Procedure: SCROTUM EXPLORATION;  Surgeon: Cleon Gustin, MD;  Location: [place];  Service: Urology;  Laterality: N/A;   . UPPER EXTREMITY ANGIOGRAM Left 02/22/2018   Procedure: left lower EXTREMITY ANGIOGRAM;  Surgeon: Waynetta Sandy, MD;  Location: [facility];  Service: Vascular;  Laterality: Left;   Family History:  Family History  Problem Relation Age of Onset  . Diabetes Mother    Family Psychiatric  History: See previous Social History:  Social History   Substance and Sexual Activity  Alcohol Use No     Social History   Substance and Sexual Activity  Drug Use No    Social History   Socioeconomic History  . Marital status: Single    Spouse name: Not on file  . Number of children: Not on file  . Years of education: Not on file  . Highest education level: Not on file  Occupational History  . Occupation: Disabled  Social Needs  . Financial resource strain: Not on file  . Food insecurity    Worry: Not on file    Inability: Not on file  . Transportation needs    Medical: Not on file    Non-medical: Not on file  Tobacco Use  . Smoking status: Former Smoker    Types: Cigarettes  . Smokeless tobacco: Never Used  Substance and Sexual Activity  . Alcohol use: No  . Drug use: No  . Sexual activity: Never  Lifestyle  . Physical activity    Days per week: Not on file    Minutes per session: Not on file  . Stress: Not on file  Relationships  . Social Herbalist on phone: Not on file    Gets together: Not on file  Attends religious service: Not on file    Active member of club or organization: Not on file    Attends meetings of clubs or organizations: Not on file    Relationship status: Not on file  Other Topics Concern  . Not on file  Social History Narrative   ** Merged History Encounter **       Additional Social History:                         Sleep: Fair  Appetite:  Fair  Current Medications: Current Facility-Administered Medications  Medication Dose Route Frequency Provider Last Rate Last Dose  . acetaminophen (TYLENOL) tablet 650 mg   650 mg Oral Q6H PRN Patrecia Pour, NP   650 mg at 09/09/18 1253  . alum & mag hydroxide-simeth (MAALOX/MYLANTA) 200-200-20 MG/5ML suspension 30 mL  30 mL Oral Q4H PRN Patrecia Pour, NP   30 mL at 09/20/18 1508  . atropine 1 % ophthalmic solution 2 drop  2 drop Sublingual QID Sharma Covert, MD   2 drop at 09/27/18 1209  . clopidogrel (PLAVIX) tablet 75 mg  75 mg Oral Daily Patrecia Pour, NP   75 mg at 09/27/18 1324  . cloZAPine (CLOZARIL) tablet 300 mg  300 mg Oral QHS Sharma Covert, MD   300 mg at 09/26/18 2125  . divalproex (DEPAKOTE) DR tablet 750 mg  750 mg Oral BID Sharma Covert, MD   750 mg at 09/27/18 0906  . gabapentin (NEURONTIN) capsule 300 mg  300 mg Oral TID Johnn Hai, MD   300 mg at 09/27/18 1209  . haloperidol (HALDOL) tablet 5 mg  5 mg Oral Q6H PRN Johnn Hai, MD   5 mg at 09/15/18 1846   Or  . haloperidol lactate (HALDOL) injection 10 mg  10 mg Intramuscular Q6H PRN Johnn Hai, MD      . haloperidol (HALDOL) tablet 5 mg  5 mg Oral Q6H PRN Sharma Covert, MD       Or  . haloperidol lactate (HALDOL) injection 10 mg  10 mg Intramuscular Q6H PRN Sharma Covert, MD   10 mg at 09/13/18 1703  . LORazepam (ATIVAN) tablet 1 mg  1 mg Oral Q6H PRN Johnn Hai, MD   1 mg at 09/20/18 2111   Or  . LORazepam (ATIVAN) injection 2 mg  2 mg Intramuscular Q4H PRN Johnn Hai, MD   2 mg at 09/03/18 0044  . LORazepam (ATIVAN) tablet 1 mg  1 mg Oral BID Johnn Hai, MD   1 mg at 09/27/18 0905  . magnesium hydroxide (MILK OF MAGNESIA) suspension 30 mL  30 mL Oral Daily PRN Patrecia Pour, NP   30 mL at 09/11/18 1136  . multivitamin with minerals tablet 1 tablet  1 tablet Oral Daily [name], [name], MD   1 tablet at 09/27/18 1210  . pantoprazole (PROTONIX) EC tablet 40 mg  40 mg Oral BID Johnn Hai, MD   40 mg at 09/27/18 4010  . zolpidem (AMBIEN) tablet 5 mg  5 mg Oral QHS [name], [name], MD   5 mg at 09/26/18 2125    Lab Results: No results found for this or  any previous visit (from the past 59 hour(s)).  Blood Alcohol level:  Lab Results  Component Value Date   [facility] <10 08/31/2018   ETH <10 27/25/3664    Metabolic Disorder Labs: Lab Results  Component Value  Date   HGBA1C 5.4 09/30/2016   MPG 108.28 09/30/2016   MPG 114 09/17/2016   No results found for: PROLACTIN Lab Results  Component Value Date   CHOL 135 09/30/2016   TRIG 104 02/25/2018   HDL 60 09/30/2016   CHOLHDL 2.3 09/30/2016   VLDL 12 09/30/2016   LDLCALC 63 09/30/2016    Physical Findings: AIMS: Facial and Oral Movements Muscles of Facial Expression: None, normal Lips and Perioral Area: None, normal Jaw: None, normal Tongue: None, normal,Extremity Movements Upper (arms, wrists, hands, fingers): None, normal Lower (legs, knees, ankles, toes): None, normal, Trunk Movements Neck, shoulders, hips: None, normal, Overall Severity Severity of abnormal movements (highest score from questions above): None, normal Incapacitation due to abnormal movements: None, normal Patient's awareness of abnormal movements (rate only patient's report): No Awareness, Dental Status Current problems with teeth and/or dentures?: No Does patient usually wear dentures?: No  CIWA:  CIWA-Ar Total: 2 COWS:  COWS Total Score: 4  Musculoskeletal: Strength & Muscle Tone: within normal limits Gait & Station: normal Patient leans: N/A  Psychiatric Specialty Exam: Physical Exam  Nursing note and vitals reviewed. Constitutional: He appears well-developed and well-nourished.  HENT:  Head: Normocephalic and atraumatic.  Eyes: Pupils are equal, round, and reactive to light. Conjunctivae are normal.  Neck: Normal range of motion.  Cardiovascular: Regular rhythm and normal heart sounds.  Respiratory: Effort normal.  GI: Soft.  Musculoskeletal: Normal range of motion.  Neurological: He is alert.  Skin: Skin is warm and dry.  Psychiatric: His affect is blunt. His speech is delayed. He is  slowed. Cognition and memory are impaired. He expresses impulsivity. He expresses no homicidal and no suicidal ideation.    Review of Systems  Constitutional: Negative.   HENT: Negative.   Eyes: Negative.   Respiratory: Negative.   Cardiovascular: Negative.   Gastrointestinal: Negative.   Musculoskeletal: Negative.   Skin: Negative.   Neurological: Negative.   Psychiatric/Behavioral: Negative.     Blood pressure (!) 125/92, pulse 99, temperature 97.6 F (36.4 C), temperature source Oral, resp. rate 17, height 5\' 11"  (1.803 m), weight 67.1 kg, SpO2 100 %.Body mass index is 20.64 kg/m.  General Appearance: Casual  Eye Contact:  Good  Speech:  Garbled and Slow  Volume:  Decreased  Mood:  Euthymic  Affect:  Constricted  Thought Process:  Disorganized  Orientation:  Full (Time, Place, and Person)  Thought Content:  Illogical, Rumination and Tangential  Suicidal Thoughts:  No  Homicidal Thoughts:  No  Memory:  Immediate;   Fair Recent;   Poor Remote;   Poor  Judgement:  Impaired  Insight:  Shallow  Psychomotor Activity:  Decreased  Concentration:  Concentration: Poor  Recall:  Poor  Fund of Knowledge:  Poor  Language:  Poor  Akathisia:  No  Handed:  Right  AIMS (if indicated):     Assets:  Desire for Improvement Social Support  ADL's:  Impaired  Cognition:  Impaired,  Moderate  Sleep:  Number of Hours: 7.5     Treatment Plan Summary: Daily contact with patient to assess and evaluate symptoms and progress in treatment, Medication management and Plan No change to medication treatment.  Supportive therapy.  Continue to work on her discharge plan we hope for next week.  Alethia Berthold, MD 09/27/2018, 4:37 PM

## 2018-09-27 NOTE — Progress Notes (Signed)
Pt has been calm, cooperative, assertive, redirectable and interactive. [name] RN
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
## 2018-09-27 NOTE — [facility] Group Notes (Signed)
LCSW Group Therapy Note  09/27/2018 1:00 PM  Type of Therapy/Topic:  Group Therapy:  Balance in Life  Participation Level:  Minimal  Description of Group:    This group will address the concept of balance and how it feels and looks when one is unbalanced. Patients will be encouraged to process areas in their lives that are out of balance and identify reasons for remaining unbalanced. Facilitators will guide patients in utilizing problem-solving interventions to address and correct the stressor making their life unbalanced. Understanding and applying boundaries will be explored and addressed for obtaining and maintaining a balanced life. Patients will be encouraged to explore ways to assertively make their unbalanced needs known to significant others in their lives, using other group members and facilitator for support and feedback.  Therapeutic Goals: 1. Patient will identify two or more emotions or situations they have that consume much of in their lives. 2. Patient will identify signs/triggers that life has become out of balance:  3. Patient will identify two ways to set boundaries in order to achieve balance in their lives:  4. Patient will demonstrate ability to communicate their needs through discussion and/or role plays  Summary of Patient Progress: Patient was present for group.  Patient shared that feeling unbalanced can lead to a "temper".  Patient appeared attentive throughout group.    Therapeutic Modalities:   Cognitive Behavioral Therapy Solution-Focused Therapy Assertiveness Training  Assunta Curtis MSW, LCSW 09/27/2018 11:46 AM

## 2018-09-28 LAB — CBC WITH DIFFERENTIAL/PLATELET
Abs Immature Granulocytes: 0.11 10*3/uL — ABNORMAL HIGH (ref 0.00–0.07)
Basophils Absolute: 0 10*3/uL (ref 0.0–0.1)
Basophils Relative: 1 %
Eosinophils Absolute: 0.1 10*3/uL (ref 0.0–0.5)
Eosinophils Relative: 2 %
HCT: 39.6 % (ref 39.0–52.0)
Hemoglobin: 12.4 g/dL — ABNORMAL LOW (ref 13.0–17.0)
Immature Granulocytes: 2 %
Lymphocytes Relative: 19 %
Lymphs Abs: 0.9 10*3/uL (ref 0.7–4.0)
MCH: 30.2 pg (ref 26.0–34.0)
MCHC: 31.3 g/dL (ref 30.0–36.0)
MCV: 96.6 fL (ref 80.0–100.0)
Monocytes Absolute: 0.4 10*3/uL (ref 0.1–1.0)
Monocytes Relative: 8 %
Neutro Abs: 3.4 10*3/uL (ref 1.7–7.7)
Neutrophils Relative %: 68 %
Platelets: 157 10*3/uL (ref 150–400)
RBC: 4.1 MIL/uL — ABNORMAL LOW (ref 4.22–5.81)
RDW: 14.1 % (ref 11.5–15.5)
WBC: 4.9 10*3/uL (ref 4.0–10.5)
nRBC: 0.8 % — ABNORMAL HIGH (ref 0.0–0.2)

## 2018-09-28 NOTE — Progress Notes (Signed)
"  Austin Gates" has been in the milieu, frequently visiting the nurses station to talk to his nurse. Can be intrusive. Frequently redirected. Patient was found in another patient's room and expectations were reinforced. Patient is otherwise pleasant, denying thoughts of self harm. Taking medications voluntarily and eating well. No new concerns noted. Saff continue to provide support and encouragements. Safety monitored with close observations.

## 2018-09-28 NOTE — Plan of Care (Signed)
  Problem: Coping: Goal: Ability to interact with others will improve Outcome: Progressing  Patient noted interacting appropriately with peers and staff.

## 2018-09-28 NOTE — Progress Notes (Signed)
[facility] MD Progress Note  09/28/2018 12:37 PM Austin Gates  MRN:  [id number] Subjective: Follow-up for this patient with schizoaffective disorder.  No new complaints.  Continues to be a little bit intrusive multiple times a day asking when he will be discharged but does not hospital about it.  Just has a hard time keeping things straight in his head.  No new behavior problems.  Stable on his medicine. 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 Hypertension   . Intellectual disability   . Obsessive-compulsive disorder   . Schizo-affective schizophrenia [facility])     Past Surgical History:  Procedure Laterality Date  . COLONOSCOPY WITH PROPOFOL N/A 12/28/2017   Procedure: COLONOSCOPY WITH PROPOFOL;  Surgeon: Jonathon Bellows, MD;  Location: [facility] ENDOSCOPY;  Service: Gastroenterology;  Laterality: N/A;  . HYDROCELE EXCISION Bilateral 02/22/2018   Procedure: bilateral HYDROCELECTOMY ADULT;  Surgeon: Cleon Gustin, MD;  Location: [place];  Service: Urology;  Laterality: Bilateral;  . INSERTION OF ILIAC STENT Left 02/22/2018   Procedure: INSERTION LEFT SUPERIOR FEMORAL ARTERY USING 6MM X 5CM VIABHON STENT with mynx device closure on right femoral artery;  Surgeon: Waynetta Sandy, MD;  Location: [place];  Service: Vascular;  Laterality: Left;  . KNEE ARTHROSCOPY    . SCROTAL EXPLORATION N/A 02/22/2018   Procedure: SCROTUM EXPLORATION;  Surgeon: Cleon Gustin, MD;  Location: [place];  Service: Urology;  Laterality: N/A;  . UPPER EXTREMITY ANGIOGRAM Left  02/22/2018   Procedure: left lower EXTREMITY ANGIOGRAM;  Surgeon: Waynetta Sandy, MD;  Location: [facility] OR;  Service: Vascular;  Laterality: Left;   Family History:  Family History  Problem Relation Age of Onset  . Diabetes Mother    Family Psychiatric  History: See previous Social History:  Social History   Substance and Sexual Activity  Alcohol Use No     Social History   Substance and Sexual Activity  Drug Use No    Social History   Socioeconomic History  . Marital status: Single    Spouse name: Not on file  . Number of children: Not on file  . Years of education: Not on file  . Highest education level: Not on file  Occupational History  . Occupation: Disabled  Social Needs  . Financial resource strain: Not on file  . Food insecurity    Worry: Not on file    Inability: Not on file  . Transportation needs    Medical: Not on file    Non-medical: Not on file  Tobacco Use  . Smoking status: Former Smoker    Types: Cigarettes  . Smokeless tobacco: Never Used  Substance and Sexual Activity  . Alcohol use: No  . Drug use: No  . Sexual activity: Never  Lifestyle  . Physical activity    Days per week: Not on file    Minutes per session: Not on file  . Stress: Not on file  Relationships  . Social Herbalist on phone: Not on file    Gets together: Not on file    Attends religious service: Not on file    Active member  of club or organization: Not on file    Attends meetings of clubs or organizations: Not on file    Relationship status: Not on file  Other Topics Concern  . Not on file  Social History Narrative   ** Merged History Encounter **       Additional Social History:                         Sleep: Fair  Appetite:  Fair  Current Medications: Current Facility-Administered Medications  Medication Dose Route Frequency Provider Last Rate Last Dose  . acetaminophen (TYLENOL) tablet 650 mg  650 mg Oral Q6H PRN Patrecia Pour,  NP   650 mg at 09/09/18 1253  . alum & mag hydroxide-simeth (MAALOX/MYLANTA) 200-200-20 MG/5ML suspension 30 mL  30 mL Oral Q4H PRN Patrecia Pour, NP   30 mL at 09/28/18 1052  . atropine 1 % ophthalmic solution 2 drop  2 drop Sublingual QID Sharma Covert, MD   2 drop at 09/28/18 1202  . clopidogrel (PLAVIX) tablet 75 mg  75 mg Oral Daily Patrecia Pour, NP   75 mg at 09/28/18 8563  . cloZAPine (CLOZARIL) tablet 300 mg  300 mg Oral QHS Sharma Covert, MD   300 mg at 09/27/18 2118  . divalproex (DEPAKOTE) DR tablet 750 mg  750 mg Oral BID Sharma Covert, MD   750 mg at 09/28/18 0806  . gabapentin (NEURONTIN) capsule 300 mg  300 mg Oral TID Johnn Hai, MD   300 mg at 09/28/18 1202  . haloperidol (HALDOL) tablet 5 mg  5 mg Oral Q6H PRN Johnn Hai, MD   5 mg at 09/15/18 1846   Or  . haloperidol lactate (HALDOL) injection 10 mg  10 mg Intramuscular Q6H PRN Johnn Hai, MD      . haloperidol (HALDOL) tablet 5 mg  5 mg Oral Q6H PRN Sharma Covert, MD   5 mg at 09/27/18 2119   Or  . haloperidol lactate (HALDOL) injection 10 mg  10 mg Intramuscular Q6H PRN Sharma Covert, MD   10 mg at 09/13/18 1703  . LORazepam (ATIVAN) tablet 1 mg  1 mg Oral Q6H PRN Johnn Hai, MD   1 mg at 09/20/18 2111   Or  . LORazepam (ATIVAN) injection 2 mg  2 mg Intramuscular Q4H PRN Johnn Hai, MD   2 mg at 09/03/18 0044  . LORazepam (ATIVAN) tablet 1 mg  1 mg Oral BID Johnn Hai, MD   1 mg at 09/28/18 0806  . magnesium hydroxide (MILK OF MAGNESIA) suspension 30 mL  30 mL Oral Daily PRN Patrecia Pour, NP   30 mL at 09/11/18 1136  . multivitamin with minerals tablet 1 tablet  1 tablet Oral Daily Koray Soter, [name], MD   1 tablet at 09/28/18 [phone]  . pantoprazole (PROTONIX) EC tablet 40 mg  40 mg Oral BID Johnn Hai, MD   40 mg at 09/28/18 0263  . zolpidem (AMBIEN) tablet 5 mg  5 mg Oral QHS Dali Kraner, [name], MD   5 mg at 09/27/18 2119    Lab Results:  Results for orders placed or performed  during the hospital encounter of 08/31/18 (from the past 48 hour(s))  CBC with Differential/Platelet     Status: Abnormal   Collection Time: 09/28/18  8:18 AM  Result Value Ref Range   WBC 4.9 4.0 - 10.5 K/uL   RBC 4.10 (L)  4.22 - 5.81 MIL/uL   Hemoglobin 12.4 (L) 13.0 - 17.0 g/dL   HCT 39.6 39.0 - 52.0 %   MCV 96.6 80.0 - 100.0 fL   MCH 30.2 26.0 - 34.0 pg   MCHC 31.3 30.0 - 36.0 g/dL   RDW 14.1 11.5 - 15.5 %   Platelets 157 150 - 400 K/uL   nRBC 0.8 (H) 0.0 - 0.2 %   Neutrophils Relative % 68 %   Neutro Abs 3.4 1.7 - 7.7 K/uL   Lymphocytes Relative 19 %   Lymphs Abs 0.9 0.7 - 4.0 K/uL   Monocytes Relative 8 %   Monocytes Absolute 0.4 0.1 - 1.0 K/uL   Eosinophils Relative 2 %   Eosinophils Absolute 0.1 0.0 - 0.5 K/uL   Basophils Relative 1 %   Basophils Absolute 0.0 0.0 - 0.1 K/uL   Immature Granulocytes 2 %   Abs Immature Granulocytes 0.11 (H) 0.00 - 0.07 K/uL    Comment: Performed at [facility], [address] [place],  [address]    Blood Alcohol level:  Lab Results  Component Value Date   [facility] <10 08/31/2018   ETH <10 17/61/6073    Metabolic Disorder Labs: Lab Results  Component Value Date   HGBA1C 5.4 09/30/2016   MPG 108.28 09/30/2016   MPG 114 09/17/2016   No results found for: PROLACTIN Lab Results  Component Value Date   CHOL 135 09/30/2016   TRIG 104 02/25/2018   HDL 60 09/30/2016   CHOLHDL 2.3 09/30/2016   VLDL 12 09/30/2016   LDLCALC 63 09/30/2016    Physical Findings: AIMS: Facial and Oral Movements Muscles of Facial Expression: None, normal Lips and Perioral Area: None, normal Jaw: None, normal Tongue: None, normal,Extremity Movements Upper (arms, wrists, hands, fingers): None, normal Lower (legs, knees, ankles, toes): None, normal, Trunk Movements Neck, shoulders, hips: None, normal, Overall Severity Severity of abnormal movements (highest score from questions above): None, normal Incapacitation due to abnormal  movements: None, normal Patient's awareness of abnormal movements (rate only patient's report): No Awareness, Dental Status Current problems with teeth and/or dentures?: No Does patient usually wear dentures?: No  CIWA:  CIWA-Ar Total: 2 COWS:  COWS Total Score: 4  Musculoskeletal: Strength & Muscle Tone: within normal limits Gait & Station: normal Patient leans: N/A  Psychiatric Specialty Exam: Physical Exam  Nursing note and vitals reviewed. Constitutional: He appears well-developed and well-nourished.  HENT:  Head: Normocephalic and atraumatic.  Eyes: Pupils are equal, round, and reactive to light. Conjunctivae are normal.  Neck: Normal range of motion.  Cardiovascular: Regular rhythm and normal heart sounds.  Respiratory: Effort normal.  GI: Soft.  Musculoskeletal: Normal range of motion.  Neurological: He is alert.  Skin: Skin is warm and dry.  Psychiatric: He has a normal mood and affect. His behavior is normal. Judgment and thought content normal.    Review of Systems  Constitutional: Negative.   HENT: Negative.   Eyes: Negative.   Respiratory: Negative.   Cardiovascular: Negative.   Gastrointestinal: Negative.   Musculoskeletal: Negative.   Skin: Negative.   Neurological: Negative.   Psychiatric/Behavioral: Negative.     Blood pressure 117/80, pulse 97, temperature 97.6 F (36.4 C), temperature source Oral, resp. rate 17, height 5\' 11"  (1.803 m), weight 67.1 kg, SpO2 100 %.Body mass index is 20.64 kg/m.  General Appearance: Casual  Eye Contact:  Fair  Speech:  Normal Rate  Volume:  Normal  Mood:  Euthymic  Affect:  Constricted  Thought Process:  Goal Directed  Orientation:  Full (Time, Place, and Person)  Thought Content:  Logical  Suicidal Thoughts:  No  Homicidal Thoughts:  No  Memory:  Immediate;   Fair Recent;   Fair Remote;   Fair  Judgement:  Fair  Insight:  Fair  Psychomotor Activity:  Normal  Concentration:  Concentration: Poor  Recall:   Poor  Fund of Knowledge:  Poor  Language:  Poor  Akathisia:  No  Handed:  Right  AIMS (if indicated):     Assets:  Desire for Improvement  ADL's:  Impaired  Cognition:  Impaired,  Moderate  Sleep:  Number of Hours: 8.25     Treatment Plan Summary: Daily contact with patient to assess and evaluate symptoms and progress in treatment, Medication management and Plan Patient is really quite stable.  Fully ready for discharge.  Because of his special needs we have had a delay in finding an appropriate facility.  Possible discharge sometime next week.  Reassurance and education done repeatedly with the patient.  Alethia Berthold, MD 09/28/2018, 12:37 PM

## 2018-09-28 NOTE — [facility] Group Notes (Signed)
LCSW Group Therapy Note  09/28/2018 11:28 AM  Type of Therapy and Topic:  Group Therapy:  Feelings around Relapse and Recovery  Participation Level:  Active   Description of Group:    Patients in this group will discuss emotions they experience before and after a relapse. They will process how experiencing these feelings, or avoidance of experiencing them, relates to having a relapse. Facilitator will guide patients to explore emotions they have related to recovery. Patients will be encouraged to process which emotions are more powerful. They will be guided to discuss the emotional reaction significant others in their lives may have to their relapse or recovery. Patients will be assisted in exploring ways to respond to the emotions of others without this contributing to a relapse.  Therapeutic Goals: 1. Patient will identify two or more emotions that lead to a relapse for them 2. Patient will identify two emotions that result when they relapse 3. Patient will identify two emotions related to recovery 4. Patient will demonstrate ability to communicate their needs through discussion and/or role plays   Summary of Patient Progress: Pt participated to the best of his ability, but did not seem to comprehend what the discussion was. Pt often repeated what other group members said as if he said it first. Pt reported that he's going to "stay out the hospital". Pt had to be redirected multiple times for talking over other group members and interrupting. Pt walked up to CSW as she was leading group twice and had to be redirected to sit down.     Therapeutic Modalities:   Cognitive Behavioral Therapy Solution-Focused Therapy Assertiveness Training Relapse Prevention Therapy   [name], MSW, LCSW Clinical Social Work 09/28/2018 11:28 AM

## 2018-09-28 NOTE — Progress Notes (Addendum)
Patient alert and oriented x 4, affect is blunted, he denies SI/HI/AVH, his thoughts are organized and coherent, speech is garbled and unclear he appears less irritable, he was frequently redirected and reoriented for safety, he is receptive to staff and complaint with staff, 15 minutes safety checks maintained will continue to monitor.

## 2018-09-28 NOTE — Consult Note (Signed)
CLOZAPINE MONITORING (reflects NEW REMS GUIDELINES EFFECTIVE 12/02/2013):  Check ANC at least weekly while inpatient.  For general population patients, i.e., those without benign ethnic neutropenia (BEN): --If [place] 1000-1499, increase ANC monitoring to 3x/wk --If ANC < 1000, HOLD CLOZAPINE and get psych consult   For patients with BEN: --If [place], increase ANC monitoring to 3x/wk --If ANC < 500, HOLD CLOZAPINE and get psych consult  Results:   07/17 [place] 4600 07/24 [place] 5800 07/31 [place] 2400 8/7 [place] 3400  Lab reported to clozapine registry Pt is eligible to receive clozapine Next labs due in one week per hospital policy on 2/33  REMS-certified psychiatry provider can continue drug with [place] below cited thresholds if they document medical opinion that the neutropenia is not clozapine-induced (heme consult is recommended) or that risk of interrupting therapy is greater than the risk of developing severe neutropenia.  --SEE PRESCRIBING INFORMATION FOR ADDITIONAL DETAILS

## 2018-09-29 NOTE — Progress Notes (Signed)
D: Patient observed ambulating the halls continuously, can be intrusive at times and frequents the nursing station to ask what time it is or various other questions. Ambulates to go get his meals, medication and water from the community room. States that his sleep last night was good. Reports that his appetite is good with normal energy. Rates his depression, hopelessness as being low and anxiety is rated at a 0. Denies pain, SI/HI/AVH at this time.  A: Encouraged patient participation with unit programming. Education provided on medication and treatment plans. Patient given support and encouragement to be proactive with his treatment plan.   R:  Patient continues to be monitored Q 15 minutes for safety per unit protocol. Patient remains safe on the unit

## 2018-09-29 NOTE — Progress Notes (Signed)
[facility] MD Progress Note  09/29/2018 9:18 AM Austin Gates  MRN:  [id number] Subjective: Patient is a 58 year old male with a past psychiatric significant for schizoaffective disorder; bipolar type and intellectual disability.  He was admitted on 09/01/2018 secondary to disruptive and volatile behavior at his group home.  Objective: Patient is seen and examined.  Patient is a 58 year old male with the above-stated past psychiatric history who is seen in follow-up.  Patient is familiar to me from rounding on him previously.  He is essentially unchanged.  He remains intrusive but less so than previously.  His only questions are that he wants to get out of the hospital.  He has been explained multiple times by multiple staff members as well as multiple doctors that his placement is still pending.  The hope is that he will be able to be transferred to a long-term facility sometime next week.  Review of his laboratories revealed his last chemistries were on 7/27.  His liver function enzymes were normal at that time.  His most recent CBC on 8/7 showed mild decrease in his hemoglobin, but otherwise negative.  His last Depakote level was on 7/27.  It was 34.  His blood pressure this morning is 121/92, pulse is 81.  He is afebrile.  His oxygen saturations 99% on room air.  He slept 7 hours last night.  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 Hypertension   . Intellectual disability   . Obsessive-compulsive disorder   . Schizo-affective schizophrenia [facility])     Past Surgical History:  Procedure Laterality Date  . COLONOSCOPY WITH PROPOFOL N/A  12/28/2017   Procedure: COLONOSCOPY WITH PROPOFOL;  Surgeon: Jonathon Bellows, MD;  Location: [facility] ENDOSCOPY;  Service: Gastroenterology;  Laterality: N/A;  . HYDROCELE EXCISION Bilateral 02/22/2018   Procedure: bilateral HYDROCELECTOMY ADULT;  Surgeon: Cleon Gustin, MD;  Location: [place];  Service: Urology;  Laterality: Bilateral;  . INSERTION OF ILIAC STENT Left 02/22/2018   Procedure: INSERTION LEFT SUPERIOR FEMORAL ARTERY USING 6MM X 5CM VIABHON STENT with mynx device closure on right femoral artery;  Surgeon: Waynetta Sandy, MD;  Location: [place];  Service: Vascular;  Laterality: Left;  . KNEE ARTHROSCOPY    . SCROTAL EXPLORATION N/A 02/22/2018   Procedure: SCROTUM EXPLORATION;  Surgeon: Cleon Gustin, MD;  Location: [place];  Service: Urology;  Laterality: N/A;  . UPPER EXTREMITY ANGIOGRAM Left 02/22/2018   Procedure: left lower EXTREMITY ANGIOGRAM;  Surgeon: Waynetta Sandy, MD;  Location: [facility] OR;  Service: Vascular;  Laterality: Left;   Family History:  Family History  Problem Relation Age of Onset  . Diabetes Mother    Family Psychiatric  History: See admission H&P Social History:  Social History   Substance and Sexual Activity  Alcohol Use No     Social History   Substance and Sexual Activity  Drug Use No    Social History   Socioeconomic History  . Marital status: Single    Spouse name: Not on file  . Number of children: Not on file  . Years of education: Not on file  . Highest education level: Not on file  Occupational History  . Occupation:  Disabled  Social Needs  . Financial resource strain: Not on file  . Food insecurity    Worry: Not on file    Inability: Not on file  . Transportation needs    Medical: Not on file    Non-medical: Not on file  Tobacco Use  . Smoking status: Former Smoker    Types: Cigarettes  . Smokeless tobacco: Never Used  Substance and Sexual Activity  . Alcohol use: No  . Drug use: No  . Sexual activity: Never   Lifestyle  . Physical activity    Days per week: Not on file    Minutes per session: Not on file  . Stress: Not on file  Relationships  . Social Herbalist on phone: Not on file    Gets together: Not on file    Attends religious service: Not on file    Active member of club or organization: Not on file    Attends meetings of clubs or organizations: Not on file    Relationship status: Not on file  Other Topics Concern  . Not on file  Social History Narrative   ** Merged History Encounter **       Additional Social History:                         Sleep: Good  Appetite:  Good  Current Medications: Current Facility-Administered Medications  Medication Dose Route Frequency Provider Last Rate Last Dose  . acetaminophen (TYLENOL) tablet 650 mg  650 mg Oral Q6H PRN Patrecia Pour, NP   650 mg at 09/09/18 1253  . alum & mag hydroxide-simeth (MAALOX/MYLANTA) 200-200-20 MG/5ML suspension 30 mL  30 mL Oral Q4H PRN Patrecia Pour, NP   30 mL at 09/28/18 1052  . atropine 1 % ophthalmic solution 2 drop  2 drop Sublingual QID Sharma Covert, MD   2 drop at 09/29/18 [id number]  . clopidogrel (PLAVIX) tablet 75 mg  75 mg Oral Daily Patrecia Pour, NP   75 mg at 09/29/18 0820  . cloZAPine (CLOZARIL) tablet 300 mg  300 mg Oral QHS Sharma Covert, MD   300 mg at 09/28/18 2126  . divalproex (DEPAKOTE) DR tablet 750 mg  750 mg Oral BID Sharma Covert, MD   750 mg at 09/29/18 0820  . gabapentin (NEURONTIN) capsule 300 mg  300 mg Oral TID Johnn Hai, MD   300 mg at 09/29/18 0820  . haloperidol (HALDOL) tablet 5 mg  5 mg Oral Q6H PRN Johnn Hai, MD   5 mg at 09/15/18 1846   Or  . haloperidol lactate (HALDOL) injection 10 mg  10 mg Intramuscular Q6H PRN Johnn Hai, MD      . haloperidol (HALDOL) tablet 5 mg  5 mg Oral Q6H PRN Sharma Covert, MD   5 mg at 09/27/18 2119   Or  . haloperidol lactate (HALDOL) injection 10 mg  10 mg Intramuscular Q6H PRN Sharma Covert, MD   10 mg at 09/13/18 1703  . LORazepam (ATIVAN) tablet 1 mg  1 mg Oral Q6H PRN Johnn Hai, MD   1 mg at 09/20/18 2111   Or  . LORazepam (ATIVAN) injection 2 mg  2 mg Intramuscular Q4H PRN Johnn Hai, MD   2 mg at 09/03/18 0044  . LORazepam (ATIVAN) tablet 1 mg  1 mg Oral BID Johnn Hai, MD   1 mg at 09/29/18 0820  .  magnesium hydroxide (MILK OF MAGNESIA) suspension 30 mL  30 mL Oral Daily PRN Patrecia Pour, NP   30 mL at 09/11/18 1136  . multivitamin with minerals tablet 1 tablet  1 tablet Oral Daily [name], [name], MD   1 tablet at 09/29/18 0820  . pantoprazole (PROTONIX) EC tablet 40 mg  40 mg Oral BID Johnn Hai, MD   40 mg at 09/29/18 0820  . zolpidem (AMBIEN) tablet 5 mg  5 mg Oral QHS [name], [name], MD   5 mg at 09/28/18 2127    Lab Results:  Results for orders placed or performed during the hospital encounter of 08/31/18 (from the past 48 hour(s))  CBC with Differential/Platelet     Status: Abnormal   Collection Time: 09/28/18  8:18 AM  Result Value Ref Range   WBC 4.9 4.0 - 10.5 K/uL   RBC 4.10 (L) 4.22 - 5.81 MIL/uL   Hemoglobin 12.4 (L) 13.0 - 17.0 g/dL   HCT 39.6 39.0 - 52.0 %   MCV 96.6 80.0 - 100.0 fL   MCH 30.2 26.0 - 34.0 pg   MCHC 31.3 30.0 - 36.0 g/dL   RDW 14.1 11.5 - 15.5 %   Platelets 157 150 - 400 K/uL   nRBC 0.8 (H) 0.0 - 0.2 %   Neutrophils Relative % 68 %   Neutro Abs 3.4 1.7 - 7.7 K/uL   Lymphocytes Relative 19 %   Lymphs Abs 0.9 0.7 - 4.0 K/uL   Monocytes Relative 8 %   Monocytes Absolute 0.4 0.1 - 1.0 K/uL   Eosinophils Relative 2 %   Eosinophils Absolute 0.1 0.0 - 0.5 K/uL   Basophils Relative 1 %   Basophils Absolute 0.0 0.0 - 0.1 K/uL   Immature Granulocytes 2 %   Abs Immature Granulocytes 0.11 (H) 0.00 - 0.07 K/uL    Comment: Performed at [facility], [address] [place], [place] [address]    Blood Alcohol level:  Lab Results  Component Value Date   [facility] <10 08/31/2018   ETH <10 38/11/1749    Metabolic  Disorder Labs: Lab Results  Component Value Date   HGBA1C 5.4 09/30/2016   MPG 108.28 09/30/2016   MPG 114 09/17/2016   No results found for: PROLACTIN Lab Results  Component Value Date   CHOL 135 09/30/2016   TRIG 104 02/25/2018   HDL 60 09/30/2016   CHOLHDL 2.3 09/30/2016   VLDL 12 09/30/2016   LDLCALC 63 09/30/2016    Physical Findings: AIMS: Facial and Oral Movements Muscles of Facial Expression: None, normal Lips and Perioral Area: None, normal Jaw: None, normal Tongue: None, normal,Extremity Movements Upper (arms, wrists, hands, fingers): None, normal Lower (legs, knees, ankles, toes): None, normal, Trunk Movements Neck, shoulders, hips: None, normal, Overall Severity Severity of abnormal movements (highest score from questions above): None, normal Incapacitation due to abnormal movements: None, normal Patient's awareness of abnormal movements (rate only patient's report): No Awareness, Dental Status Current problems with teeth and/or dentures?: No Does patient usually wear dentures?: No  CIWA:  CIWA-Ar Total: 2 COWS:  COWS Total Score: 4  Musculoskeletal: Strength & Muscle Tone: within normal limits Gait & Station: normal Patient leans: N/A  Psychiatric Specialty Exam: Physical Exam  Nursing note and vitals reviewed. Constitutional: He appears well-developed and well-nourished.  HENT:  Head: Normocephalic and atraumatic.  Respiratory: Effort normal.  Neurological: He is alert.    ROS  Blood pressure (!) 121/92, pulse 81, temperature 97.6 F (36.4  C), temperature source Oral, resp. rate 18, height 5\' 11"  (1.803 m), weight 67.1 kg, SpO2 99 %.Body mass index is 20.64 kg/m.  General Appearance: Disheveled  Eye Contact:  Good  Speech:  Normal Rate  Volume:  Decreased  Mood:  Dysphoric  Affect:  Constricted  Thought Process:  Goal Directed and Descriptions of Associations: Circumstantial  Orientation:  Negative  Thought Content:  Negative  Suicidal  Thoughts:  No  Homicidal Thoughts:  No  Memory:  Immediate;   Poor Recent;   Poor Remote;   Poor  Judgement:  Impaired  Insight:  Lacking  Psychomotor Activity:  Increased  Concentration:  Concentration: Poor and Attention Span: Poor  Recall:  Poor  Fund of Knowledge:  Poor  Language:  Poor  Akathisia:  Negative  Handed:  Right  AIMS (if indicated):     Assets:  Desire for Improvement Resilience  ADL's:  Impaired  Cognition:  Impaired,  Moderate  Sleep:  Number of Hours: 7     Treatment Plan Summary: Daily contact with patient to assess and evaluate symptoms and progress in treatment, Medication management and Plan : Patient is seen and examined.  Patient is a 58 year old male with the above-stated past psychiatric history who is seen in follow-up.   Diagnosis: #1 schizoaffective disorder, #2 intellectual deficit, #3 peripheral vascular disease, #4 GERD  Patient is seen in follow-up.  He is essentially unchanged from when I saw him last.  He remains somewhat intrusive, but less so.  He is still awaiting placement.  No change in his current medications. 1.  Continue atropine 1% ophthalmic drops to 4 times a day secondary to side effects of Clozaril. 2.  Continue Plavix 75 mg p.o. daily for vascular health. 3.  Continue Clozaril 300 mg p.o. nightly for psychosis. 4.  Continue Depakote DR 750 mg p.o. twice daily for mood stability and intrusiveness. 5.  Continue gabapentin 300 mg p.o. 3 times daily for mood stability. 6.  Continue haloperidol 5 mg p.o. every 6 hours as needed agitation. 7.  Continue lorazepam 1 mg p.o. every 6 hours as needed anxiety or agitation. 8.  Continue multivitamin 1 tablet p.o. daily for nutritional supplementation. 9.  Continue Protonix 40 mg p.o. twice daily for reflux disease. 10.  Continue Ambien 5 mg p.o. nightly for insomnia. 11.  Disposition planning-in progress.  Sharma Covert, MD 09/29/2018, 9:18 AM

## 2018-09-29 NOTE — [facility] Group Notes (Signed)
LCSW Group Therapy Note  09/29/2018 1:15pm  Type of Therapy and Topic:  Group Therapy:  Healthy Self Image and Positive Change  Participation Level:  Did Not Attend   Description of Group:  In this group, patients will compare and contrast their current "I am...." statements to the visions they identify as desirable for their lives.  Patients discuss fears and how they can make positive changes in their cognitions that will positively impact their behaviors.  Facilitator played a motivational 3-minute speech and patients were left with the task of thinking about what "I am...." statements they can start using in their lives immediately.  Therapeutic Goals: 1. Patient will state their current self-perception as expressed in an "I Am" statement 2. Patient will contrast this with their desired vision for their live 3. Patient will identify 3 fears that negatively impact their behavior 4. Patient will discuss cognitive distortions that stem from their fears 5. Patient will verbalize statements that challenge their cognitive distortions  Summary of Patient Progress:  Pt was invited to attend group but chose not to attend. CSW will continue to encourage pt to attend group throughout their admission.    Therapeutic Modalities Cognitive Behavioral Therapy Motivational Interviewing  Shuna Tabor  CUEBAS-COLON, LCSW 09/29/2018 1:01 PM

## 2018-09-29 NOTE — Progress Notes (Signed)
D: Patient is intrusive and attention-seeking at time. Cooperative. Denies SI, HI and AVH. Responds to redirection A: Continue to monitor for safety R: Safety maintained

## 2018-09-29 NOTE — Plan of Care (Signed)
  Problem: Self-Concept: Goal: Will verbalize positive feelings about self Outcome: Progressing  Patient verbalized feelings to staff.

## 2018-09-29 NOTE — Plan of Care (Signed)
  Problem: Coping: Goal: Ability to identify and develop effective coping behavior will improve Outcome: Progressing Goal: Ability to interact with others will improve Outcome: Progressing Goal: Demonstration of participation in decision-making regarding own care will improve Outcome: Progressing Goal: Ability to use eye contact when communicating with others will improve Outcome: Progressing  D: Patient is intrusive and attention-seeking at time. Cooperative. Denies SI, HI and AVH. Responds to redirection A: Continue to monitor for safety R: Safety maintained

## 2018-09-29 NOTE — Progress Notes (Signed)
Patient alert and oriented x 4, affect is blunted he is intrusive, coming to the nursing station for multiple request and also attempting to touch staff inappropriately. Patient was frequently redirected for safety and limit setting was implemented. Patient's thoughts are organized and coherent, speech is slurred and tangential, he denies SI/HI/AVH. 15 minutes safety checks maintained will continue to monitor

## 2018-09-29 NOTE — Plan of Care (Signed)
Patient is present the milieu, safety remains intact with q 15 minute safety checks. Patient showered today and changed the linen on his bed. Verbalized wanting to go outside today and stated that he was glad that he will be leaving Monday to go to a group home in [place]. Will continue to monitor.

## 2018-09-30 NOTE — [facility] Group Notes (Signed)
LCSW Group Therapy Note 09/30/2018 1:15pm  Type of Therapy and Topic: Group Therapy: Feelings Around Returning Home & Establishing a Supportive Framework and Supporting Oneself When Supports Not Available  Participation Level: Active  Description of Group:  Patients first processed thoughts and feelings about upcoming discharge. These included fears of upcoming changes, lack of change, new living environments, judgements and expectations from others and overall stigma of mental health issues. The group then discussed the definition of a supportive framework, what that looks and feels like, and how do to discern it from an unhealthy non-supportive network. The group identified different types of supports as well as what to do when your family/friends are less than helpful or unavailable  Therapeutic Goals  1. Patient will identify one healthy supportive network that they can use at discharge. 2. Patient will identify one factor of a supportive framework and how to tell it from an unhealthy network. 3. Patient able to identify one coping skill to use when they do not have positive supports from others. 4. Patient will demonstrate ability to communicate their needs through discussion and/or role plays.  Summary of Patient Progress:  The patient reported he feels good.Pt engaged during group session. As patients processed their anxiety about discharge and described healthy supports patient shared he is ready to be discharge tomorrow and move into a new group home.  Patients identified at least one self-care tool they were willing to use after discharge.   Therapeutic Modalities Cognitive Behavioral Therapy Motivational Interviewing   Jakaiya Netherland  CUEBAS-COLON, LCSW 09/30/2018 2:57 PM

## 2018-09-30 NOTE — Progress Notes (Signed)
D: Patient has been appropriate on the unit. Asking repeatedly when he is going home. Denies SI, HI and AVH.  A: Continue to monitor for safety R: Safety maintained.

## 2018-09-30 NOTE — Plan of Care (Signed)
  Problem: Coping: Goal: Ability to identify and develop effective coping behavior will improve Outcome: Progressing Goal: Ability to interact with others will improve Outcome: Progressing Goal: Demonstration of participation in decision-making regarding own care will improve Outcome: Progressing Goal: Ability to use eye contact when communicating with others will improve Outcome: Progressing  D: Patient has been appropriate on the unit. Asking repeatedly when he is going home. Denies SI, HI and AVH.  A: Continue to monitor for safety R: Safety maintained.

## 2018-09-30 NOTE — Progress Notes (Signed)
D: Patient observed ambulating the halls continuously, can be intrusive at times and frequents the nursing station to ask what time it is or various other questions. Ambulates to go get his meals, medication and water from the community room. States that his sleep last night was good. Reports that his appetite is good with normal energy. Rates his depression, hopelessness as being low and anxiety is rated at a 0. Denies pain, SI/HI/AVH at this time.  A: Encouraged patient participation with unit programming. Education provided on medication and treatment plans. Patient given support and encouragement to be proactive with his treatment plan.   R:  Patient continues to be monitored Q 15 minutes for safety per unit protocol. Patient remains safe on the unit.

## 2018-09-30 NOTE — Plan of Care (Signed)
Patient verbalizes his feelings and needs appropriately. Milieu remains safe.

## 2018-09-30 NOTE — Progress Notes (Signed)
[facility] MD Progress Note  09/30/2018 9:05 AM Austin Gates  MRN:  [id number] Subjective:  Patient is a 58 year old male with a past psychiatric significant for schizoaffective disorder; bipolar type and intellectual disability.  He was admitted on 09/01/2018 secondary to disruptive and volatile behavior at his group home.  Objective: Patient is seen and examined.  Patient is a 58 year old male with the above-stated past psychiatric history was seen in follow-up.  He is essentially unchanged.  He still at times intrusive.  He stated this morning he is going to a group home and [place].  He denied any other complaints.  His vital signs are stable, he is afebrile.  His oxygen saturation on room air is 99%.  He slept 7 hours last night.  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 Hypertension   . Intellectual disability   . Obsessive-compulsive disorder   . Schizo-affective schizophrenia [facility])     Past Surgical History:  Procedure Laterality Date  . COLONOSCOPY WITH PROPOFOL N/A 12/28/2017   Procedure: COLONOSCOPY WITH PROPOFOL;  Surgeon: Jonathon Bellows, MD;  Location: [facility] ENDOSCOPY;  Service: Gastroenterology;  Laterality: N/A;  . HYDROCELE EXCISION Bilateral 02/22/2018   Procedure: bilateral HYDROCELECTOMY ADULT;  Surgeon: Cleon Gustin, MD;  Location: [place];  Service: Urology;  Laterality: Bilateral;  . INSERTION OF ILIAC STENT Left 02/22/2018   Procedure: INSERTION LEFT SUPERIOR FEMORAL ARTERY USING 6MM X 5CM VIABHON STENT with mynx device closure on right femoral artery;  Surgeon: Waynetta Sandy, MD;  Location: [place];   Service: Vascular;  Laterality: Left;  . KNEE ARTHROSCOPY    . SCROTAL EXPLORATION N/A 02/22/2018   Procedure: SCROTUM EXPLORATION;  Surgeon: Cleon Gustin, MD;  Location: [place];  Service: Urology;  Laterality: N/A;  . UPPER EXTREMITY ANGIOGRAM Left 02/22/2018   Procedure: left lower EXTREMITY ANGIOGRAM;  Surgeon: Waynetta Sandy, MD;  Location: [facility] OR;  Service: Vascular;  Laterality: Left;   Family History:  Family History  Problem Relation Age of Onset  . Diabetes Mother    Family Psychiatric  History: See admission H&P Social History:  Social History   Substance and Sexual Activity  Alcohol Use No     Social History   Substance and Sexual Activity  Drug Use No    Social History   Socioeconomic History  . Marital status: Single    Spouse name: Not on file  . Number of children: Not on file  . Years of education: Not on file  . Highest education level: Not on file  Occupational History  . Occupation: Disabled  Social Needs  . Financial resource strain: Not on file  . Food insecurity    Worry: Not on file    Inability: Not on file  . Transportation needs    Medical: Not on file    Non-medical: Not on file  Tobacco Use  . Smoking status: Former Smoker    Types: Cigarettes  . Smokeless tobacco: Never Used  Substance and Sexual Activity  . Alcohol use: No  . Drug use: No  . Sexual activity: Never  Lifestyle  . Physical activity    Days per week: Not on file  Minutes per session: Not on file  . Stress: Not on file  Relationships  . Social Herbalist on phone: Not on file    Gets together: Not on file    Attends religious service: Not on file    Active member of club or organization: Not on file    Attends meetings of clubs or organizations: Not on file    Relationship status: Not on file  Other Topics Concern  . Not on file  Social History Narrative   ** Merged History Encounter **       Additional Social History:                          Sleep: Good  Appetite:  Good  Current Medications: Current Facility-Administered Medications  Medication Dose Route Frequency Provider Last Rate Last Dose  . acetaminophen (TYLENOL) tablet 650 mg  650 mg Oral Q6H PRN Patrecia Pour, NP   650 mg at 09/09/18 1253  . alum & mag hydroxide-simeth (MAALOX/MYLANTA) 200-200-20 MG/5ML suspension 30 mL  30 mL Oral Q4H PRN Patrecia Pour, NP   30 mL at 09/28/18 1052  . atropine 1 % ophthalmic solution 2 drop  2 drop Sublingual QID Sharma Covert, MD   2 drop at 09/30/18 [phone]  . clopidogrel (PLAVIX) tablet 75 mg  75 mg Oral Daily Patrecia Pour, NP   75 mg at 09/30/18 8563  . cloZAPine (CLOZARIL) tablet 300 mg  300 mg Oral QHS Sharma Covert, MD   300 mg at 09/29/18 2100  . divalproex (DEPAKOTE) DR tablet 750 mg  750 mg Oral BID Sharma Covert, MD   750 mg at 09/30/18 1497  . gabapentin (NEURONTIN) capsule 300 mg  300 mg Oral TID Johnn Hai, MD   300 mg at 09/30/18 0263  . haloperidol (HALDOL) tablet 5 mg  5 mg Oral Q6H PRN Johnn Hai, MD   5 mg at 09/15/18 1846   Or  . haloperidol lactate (HALDOL) injection 10 mg  10 mg Intramuscular Q6H PRN Johnn Hai, MD      . haloperidol (HALDOL) tablet 5 mg  5 mg Oral Q6H PRN Sharma Covert, MD   5 mg at 09/27/18 2119   Or  . haloperidol lactate (HALDOL) injection 10 mg  10 mg Intramuscular Q6H PRN Sharma Covert, MD   10 mg at 09/13/18 1703  . LORazepam (ATIVAN) tablet 1 mg  1 mg Oral Q6H PRN Johnn Hai, MD   1 mg at 09/20/18 2111   Or  . LORazepam (ATIVAN) injection 2 mg  2 mg Intramuscular Q4H PRN Johnn Hai, MD   2 mg at 09/03/18 0044  . LORazepam (ATIVAN) tablet 1 mg  1 mg Oral BID Johnn Hai, MD   1 mg at 09/30/18 7858  . magnesium hydroxide (MILK OF MAGNESIA) suspension 30 mL  30 mL Oral Daily PRN Patrecia Pour, NP   30 mL at 09/11/18 1136  . multivitamin with minerals tablet 1 tablet  1 tablet Oral Daily [name], [name], MD   1 tablet at  09/30/18 [phone]  . pantoprazole (PROTONIX) EC tablet 40 mg  40 mg Oral BID Johnn Hai, MD   40 mg at 09/30/18 7741  . zolpidem (AMBIEN) tablet 5 mg  5 mg Oral QHS [name], [name], MD   5 mg at 09/29/18 2100    Lab Results: No results found for this  or any previous visit (from the past 48 hour(s)).  Blood Alcohol level:  Lab Results  Component Value Date   ETH <10 08/31/2018   ETH <10 84/16/6063    Metabolic Disorder Labs: Lab Results  Component Value Date   HGBA1C 5.4 09/30/2016   MPG 108.28 09/30/2016   MPG 114 09/17/2016   No results found for: PROLACTIN Lab Results  Component Value Date   CHOL 135 09/30/2016   TRIG 104 02/25/2018   HDL 60 09/30/2016   CHOLHDL 2.3 09/30/2016   VLDL 12 09/30/2016   LDLCALC 63 09/30/2016    Physical Findings: AIMS: Facial and Oral Movements Muscles of Facial Expression: None, normal Lips and Perioral Area: None, normal Jaw: None, normal Tongue: None, normal,Extremity Movements Upper (arms, wrists, hands, fingers): None, normal Lower (legs, knees, ankles, toes): None, normal, Trunk Movements Neck, shoulders, hips: None, normal, Overall Severity Severity of abnormal movements (highest score from questions above): None, normal Incapacitation due to abnormal movements: None, normal Patient's awareness of abnormal movements (rate only patient's report): No Awareness, Dental Status Current problems with teeth and/or dentures?: No Does patient usually wear dentures?: No  CIWA:  CIWA-Ar Total: 2 COWS:  COWS Total Score: 4  Musculoskeletal: Strength & Muscle Tone: abnormal Gait & Station: shuffle Patient leans: N/A  Psychiatric Specialty Exam: Physical Exam  Nursing note and vitals reviewed. Constitutional: He is oriented to person, place, and time. He appears well-developed and well-nourished.  HENT:  Head: Normocephalic and atraumatic.  Respiratory: Effort normal.  Neurological: He is alert and oriented to person, place, and  time.    ROS  Blood pressure 99/62, pulse 86, temperature 97.6 F (36.4 C), temperature source Oral, resp. rate 15, height 5\' 11"  (1.803 m), weight 67.1 kg, SpO2 99 %.Body mass index is 20.64 kg/m.  General Appearance: Disheveled  Eye Contact:  Fair  Speech:  Normal Rate  Volume:  Decreased  Mood:  Anxious  Affect:  Flat  Thought Process:  Goal Directed and Descriptions of Associations: Circumstantial  Orientation:  Negative  Thought Content:  Rumination  Suicidal Thoughts:  No  Homicidal Thoughts:  No  Memory:  Immediate;   Poor Recent;   Poor Remote;   Poor  Judgement:  Impaired  Insight:  Lacking  Psychomotor Activity:  Increased  Concentration:  Concentration: Fair and Attention Span: Fair  Recall:  Poor  Fund of Knowledge:  Poor  Language:  Fair  Akathisia:  Negative  Handed:  Right  AIMS (if indicated):     Assets:  Desire for Improvement Resilience  ADL's:  Impaired  Cognition:  Impaired,  Moderate  Sleep:  Number of Hours: 7     Treatment Plan Summary: Daily contact with patient to assess and evaluate symptoms and progress in treatment, Medication management and Plan : Patient is seen and examined.  Patient is a 58 year old male with the above-stated past psychiatric history who is seen in follow-up.   Diagnosis: #1 schizoaffective disorder, #2 intellectual deficit, #3 peripheral vascular disease, #4 GERD  Patient is seen in follow-up.  He is essentially unchanged.  No change in his medications.  We are awaiting group home placement or placement at a state psychiatric facility. 1.  Continue atropine 1% ophthalmic drops to 4 times a day secondary to side effects of Clozaril. 2.  Continue Plavix 75 mg p.o. daily for vascular health. 3.  Continue Clozaril 300 mg p.o. nightly for psychosis. 4.  Continue Depakote DR 750 mg p.o. twice daily for mood stability  and intrusiveness. 5.  Continue gabapentin 300 mg p.o. 3 times daily for mood stability. 6.  Continue  haloperidol 5 mg p.o. every 6 hours as needed agitation. 7.  Continue lorazepam 1 mg p.o. every 6 hours as needed anxiety or agitation. 8.  Continue multivitamin 1 tablet p.o. daily for nutritional supplementation. 9.  Continue Protonix 40 mg p.o. twice daily for reflux disease. 10.  Continue Ambien 5 mg p.o. nightly for insomnia. 11.  Disposition planning-in progress.   Sharma Covert, MD 09/30/2018, 9:05 AM

## 2018-10-01 NOTE — Progress Notes (Signed)
D: Patientobserved ambulating the hallscontinuously, can be intrusive at times and frequents the nursing station to ask what time it is or various other questions. Patient also anxious about going to a group.  Ambulatesto go get his meals, medicationand water from the community room. Patient participates in outside and group therapy.  States that his sleep last night was good. Reports that his appetite is good withnormalenergy. Rates his depression, hopelessness as being low and anxiety is rated at a 0. Denies pain, SI/HI/AVH at this time.  A: Encouraged patient participation with unit programming. Education provided on medication and treatment plans. Patient given support and encouragement to be proactive with his treatment plan.   R: Patient continues to be monitored Q 15 minutes for safety per unit protocol. Patient remains safe on the unit.

## 2018-10-01 NOTE — Progress Notes (Signed)
Patient is alert and stable  cooperating with care of ADLs , takes his medications with out any side effects noted, patient is aware of his discharge proceedings and in good mood and affect is normal to circumstances, patient is socializing with peers but in the process appear intrusive, patient is none aggressive and non violent follows directions  And sleep is normal with sleep aid, patient denies any SI/HI/AVH , appetite is good and well hydrated with fluids and juices no further complains and no distress.

## 2018-10-01 NOTE — [facility] Group Notes (Signed)
Overcoming Obstacles  10/01/2018 1PM  Type of Therapy and Topic:  Group Therapy:  Overcoming Obstacles  Participation Level:  None    Description of Group:    In this group patients will be encouraged to explore what they see as obstacles to their own wellness and recovery. They will be guided to discuss their thoughts, feelings, and behaviors related to these obstacles. The group will process together ways to cope with barriers, with attention given to specific choices patients can make. Each patient will be challenged to identify changes they are motivated to make in order to overcome their obstacles. This group will be process-oriented, with patients participating in exploration of their own experiences as well as giving and receiving support and challenge from other group members.   Therapeutic Goals: 1. Patient will identify personal and current obstacles as they relate to admission. 2. Patient will identify barriers that currently interfere with their wellness or overcoming obstacles.  3. Patient will identify feelings, thought process and behaviors related to these barriers. 4. Patient will identify two changes they are willing to make to overcome these obstacles:      Summary of Patient Progress Pt came in and out of group but no input provided. Some redirection required as pt interrupted group members while they were speaking.    Therapeutic Modalities:   Cognitive Behavioral Therapy Solution Focused Therapy Motivational Interviewing Relapse Prevention Therapy    Sanjuana Kava, MSW, LCSW 10/01/2018 1:57 PM

## 2018-10-01 NOTE — Tx Team (Signed)
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 acetaminophen (TYLENOL) tablet 650 mg  650 mg Oral Q6H PRN Patrecia Pour, NP   650 mg at 09/09/18 1253  . alum & mag hydroxide-simeth (MAALOX/MYLANTA) 200-200-20 MG/5ML suspension 30 mL  30 mL Oral Q4H PRN Patrecia Pour, NP   30 mL at 09/30/18 1555  . atropine 1 % ophthalmic solution 2 drop  2 drop Sublingual QID Sharma Covert, MD   2 drop at 10/01/18 1146  . clopidogrel (PLAVIX) tablet 75 mg  75 mg Oral Daily Patrecia Pour, NP   75 mg at 10/01/18 0800  . cloZAPine (CLOZARIL) tablet 300 mg  300 mg Oral QHS Sharma Covert, MD   300 mg at 09/30/18 2122  . divalproex (DEPAKOTE) DR tablet 750 mg  750 mg Oral BID Sharma Covert, MD   750 mg at 10/01/18 0759  . gabapentin (NEURONTIN) capsule 300 mg  300 mg Oral TID Johnn Hai, MD   300 mg at 10/01/18 1147  . haloperidol (HALDOL) tablet 5 mg  5 mg Oral Q6H PRN Johnn Hai, MD   5 mg at 09/15/18 1846   Or  . haloperidol lactate (HALDOL) injection 10 mg  10 mg Intramuscular Q6H PRN Johnn Hai, MD      . haloperidol (HALDOL) tablet 5 mg  5 mg Oral Q6H PRN Sharma Covert, MD   5 mg at 09/27/18 2119   Or  . haloperidol lactate (HALDOL) injection 10 mg  10 mg Intramuscular Q6H PRN Sharma Covert, MD   10 mg at 09/13/18 1703  . LORazepam (ATIVAN) tablet 1 mg  1 mg Oral Q6H PRN Johnn Hai, MD   1 mg at 09/20/18 2111   Or  . LORazepam (ATIVAN) injection 2 mg  2 mg Intramuscular Q4H PRN Johnn Hai, MD   2  mg at 09/03/18 0044  . LORazepam (ATIVAN) tablet 1 mg  1 mg Oral BID Johnn Hai, MD   1 mg at 10/01/18 0759  . magnesium hydroxide (MILK OF MAGNESIA) suspension 30 mL  30 mL Oral Daily PRN Patrecia Pour, NP   30 mL at 09/11/18 1136  . multivitamin with minerals tablet 1 tablet  1 tablet Oral Daily [name], [name], MD   1 tablet at 10/01/18 0800  . pantoprazole (PROTONIX) EC tablet 40 mg  40 mg Oral BID Johnn Hai, MD   40 mg at 10/01/18 0800  . zolpidem (AMBIEN) tablet 5 mg  5 mg Oral QHS [name], [name], MD   5 mg at 09/30/18 2122   PTA Medications: Medications Prior to Admission  Medication Sig Dispense Refill Last Dose  . acetaminophen (TYLENOL) 500 MG tablet Take 500 mg by mouth every 6 (six) hours as needed for mild pain.     . benztropine (COGENTIN) 1 MG tablet Take 1 mg by mouth 2 (two) times daily.     . clopidogrel (PLAVIX) 75 MG tablet Take 1 tablet (75 mg total) by mouth daily. 30 tablet 0   . cloZAPine (CLOZARIL) 100 MG tablet Take 1.5 tablets (150 mg total)  by mouth 3 (three) times daily. At 8 AM, 5PM, 8PM per Dr. Darleene Cleaver (Patient taking differently: Take 100-200 mg by mouth 3 (three) times daily. One tablet (100 mg) At 8 AM, and 2PM, two tablets (200 mg) at bedtime per Dr. Darleene Cleaver)     . clozapine (CLOZARIL) 50 MG tablet Take 50 mg by mouth 2 (two) times daily. At 8 am and 2 pm     . diphenhydrAMINE (BENADRYL) 50 MG capsule Take 50 mg by mouth every 6 (six) hours as needed for allergies (sedation).      . haloperidol (HALDOL) 5 MG tablet Take 5 mg by mouth daily as needed (severe agitation).      . LORazepam (ATIVAN) 1 MG tablet Take 1 mg by mouth daily as needed (severe agitation).      . Maltodextrin-Xanthan Gum (RESOURCE THICKENUP CLEAR) POWD Take 120 g by mouth as needed. (Patient not taking: Reported on 04/07/2018) 1 Can 1   . traZODone (DESYREL) 100 MG tablet Take 100 mg by mouth at bedtime.     . [DISCONTINUED] divalproex (DEPAKOTE) 500 MG DR tablet Take 1 tablet (500  mg total) by mouth 2 (two) times daily. 30 tablet 0     Patient Stressors: Financial difficulties Health problems Medication change or noncompliance  Patient Strengths: Motivation for treatment/growth Special hobby/interest Supportive family/friends  Treatment Modalities: Medication Management, Group therapy, Case management,  1 to 1 session with clinician, Psychoeducation, Recreational therapy.   Physician Treatment Plan for Primary Diagnosis: Schizoaffective disorder, bipolar type ([place]) Long Term Goal(s): Improvement in symptoms so as ready for discharge Improvement in symptoms so as ready for discharge   Short Term Goals: Ability to identify and develop effective coping behaviors will improve Ability to maintain clinical measurements within normal limits will improve Compliance with prescribed medications will improve Ability to verbalize feelings will improve Ability to disclose and discuss suicidal ideas Ability to demonstrate self-control will improve Ability to identify and develop effective coping behaviors will improve  Medication Management: Evaluate patient's response, side effects, and tolerance of medication regimen.  Therapeutic Interventions: 1 to 1 sessions, Unit Group sessions and Medication administration.  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   Protein-calorie malnutrition, severe   Dysphagia  Long Term Goal(s): Improvement in symptoms so as ready for discharge Improvement in symptoms so as ready for discharge   Short Term Goals: Ability to identify and develop effective coping behaviors will improve Ability to maintain clinical measurements within normal limits will improve Compliance with prescribed medications will improve Ability to verbalize feelings will improve Ability to  disclose and discuss suicidal ideas Ability to demonstrate self-control will improve Ability to identify and develop effective coping behaviors will improve     Medication Management: Evaluate patient's response, side effects, and tolerance of medication regimen.  Therapeutic Interventions: 1 to 1 sessions, Unit Group sessions and Medication administration.  Evaluation of Outcomes: Progressing   RN Treatment Plan for Primary Diagnosis: Schizoaffective disorder, bipolar type ([place]) Long Term Goal(s): Knowledge of disease and therapeutic regimen to maintain health will improve  Short Term Goals: Ability to demonstrate self-control, Ability to identify and develop effective coping behaviors will improve and Compliance with prescribed medications will improve  Medication Management: RN will administer medications as ordered by provider, will assess and evaluate patient's response and provide education to patient for prescribed medication. RN will report  any adverse and/or side effects to prescribing provider.  Therapeutic Interventions: 1 on 1 counseling sessions, Psychoeducation, Medication administration, Evaluate responses to treatment, Monitor vital signs and CBGs as ordered, Perform/monitor CIWA, COWS, AIMS and Fall Risk screenings as ordered, Perform wound care treatments as ordered.  Evaluation of Outcomes: Progressing   LCSW Treatment Plan for Primary Diagnosis: Schizoaffective disorder, bipolar type ([place]) Long Term Goal(s): Safe transition to appropriate next level of care at discharge, Engage patient in therapeutic group addressing interpersonal concerns.  Short Term Goals: Engage patient in aftercare planning with referrals and resources  Therapeutic Interventions: Assess for all discharge needs, 1 to 1 time with Social worker, Explore available resources and support systems, Assess for adequacy in community support network, Educate family and significant other(s) on suicide  prevention, Complete Psychosocial Assessment, Interpersonal group therapy.  Evaluation of Outcomes: Progressing   Progress in Treatment: Attending groups: Yes. Participating in groups: Yes. Taking medication as prescribed: Yes. Toleration medication: Yes. Family/Significant other contact made: Yes, individual(s) contacted:  [name], legal guardian Patient understands diagnosis: No. Discussing patient identified problems/goals with staff: No. Medical problems stabilized or resolved: Yes. Denies suicidal/homicidal ideation: Yes. Issues/concerns per patient self-inventory: No. Other: NA  New problem(s) identified: No, Describe:  none reported  New Short Term/Long Term Goal(s): Pt did not attend today's meeting. Nurse Tech reports the pt is sedated and resting in his room. Update 09/07/18: medication management for mood stabilization; development of comprehensive mental wellness plan.  Patient Goals:  Goal not obtained at this time. Update 09/07/18:  Patient reports goal is to "go to a nursing home".      Discharge Plan or Barriers: Pt will follow up at [facility] in [place] where he is an established patient.  Update 09/07/18:  Patient has an aftercare appointment scheduled with [facility] in [place].  Patient remains on the unit to assist with Care Coordinator and legal guardian in finding placement for the patient as the guardian has declined for the patient to return to his previous group home. Patient is at baseline for care. Patient has tentatively been approved for [facility] and placement was scheduled for 7/20, however, that may not happen.  Update 7/22:  Patient remains on the unit awaiting placement. Placement at [facility] was not successful and they denied the patient due to concerns that patient was high needs.  Care Coordinator continues to look for placement.  Patient has become aggressive with hospital staff at this time. Update 09/17/18:   Patient remains on the unit awaiting placement. Patient has had one virtual meeting with a possible placement.  CSW is awaiting to here about that.  CSW and Care Coordinator are in the process of scheduling another interview with another group home. Update 09/21/18: Pt has been accepted to [facility] group [facility] with [facility]. QP Brittney will call CSW to schedule discharge and pick up date and let CSW what information she needs. Dr. Weber Cooks has been made aware and will order TB test for pt. Update 09/26/2018:  Patient remains on the unit.  Patient's care coordinator Zipporah Plants, has identified placement for the patient at [facility] and guardian has approved.  At this time they are waiting on paperwork and for [facility] to complete a walk-through of the facility for authorization purposes. Earliest possible discharge is 10/01/2018.  Update 10/01/2018:  Patient remains on the unit awaiting placement at [facility].  At last report patient is waiting for [facility] to complete Client  Specific Agreement paperwork to be completed, guardian to finish paperwork and to obtain the patients belongings and transfer to the new home.  Reason for Continuation of Hospitalization: Medication stabilization  Estimated Length of Stay: TBD  Recreational Therapy: Patient: N/A Patient Goal: Patient will engage in groups without prompting or encouragement from LRT x3 group sessions within 5 recreation therapy group sessions   Attendees: Patient: 10/01/2018 12:51 PM  Physician: Dr. Weber Cooks, MD 10/01/2018 12:51 PM  Nursing:  10/01/2018 12:51 PM  RN Care Manager: 10/01/2018 12:51 PM  Social Worker:  Assunta Curtis LCSW 10/01/2018 12:51 PM  Recreational Therapist:  10/01/2018 12:51 PM  Other:  10/01/2018 12:51 PM  Other:  10/01/2018 12:51 PM  Other: 10/01/2018 12:51 PM    Scribe for Treatment Team: [name], LCSW 10/01/2018 12:51 PM

## 2018-10-01 NOTE — Plan of Care (Signed)
Patient participates in group and outside therapies. Shows the ability to interact appropriately with peers, no outburst or aggressive behavior demonstrated.

## 2018-10-01 NOTE — BH Assessment (Signed)
CSW received an email from Zipporah Plants, the care coordinator who reports the following:  "We are still on track for him going to [facility] next week I'm pretty sure but no one at [facility] can give me a definite on when the Client-Specific Agreement will be approved and that is the step that is needed for him to move.  Once that is done, we need to make sure we get current medication list and prescriptions from you at the hospital so we can prepare.  We also need a discharge summary that is thorough since he will be going to a brand new placement."  He asked CSW to fax current medication list.  CSW did and received confirmation the fax was successful.  Assunta Curtis, MSW, LCSW 10/01/2018 1:30 PM

## 2018-10-01 NOTE — Progress Notes (Signed)
[facility] MD Progress Note  10/01/2018 4:37 PM Austin Gates  MRN:  [id number] Subjective: Patient seen chart reviewed.  Patient remains pretty stable.  He is intrusive and wanting to talk but in a friendly manner.  He was disappointed to discover that he was not going home today even though I had tried to make that repeatedly clear last week but he managed his disappointment without getting agitated.  He is now locked on to the fact that his guardian will be signing paperwork tomorrow.  We have explained to him that that does not mean he will actually be leaving tomorrow but possibly towards the end of the week.  No new physical complaints. 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 Hypertension   . Intellectual disability   . Obsessive-compulsive disorder   . Schizo-affective schizophrenia [facility])     Past Surgical History:  Procedure Laterality Date  . COLONOSCOPY WITH PROPOFOL N/A 12/28/2017   Procedure: COLONOSCOPY WITH PROPOFOL;  Surgeon: Jonathon Bellows, MD;  Location[facility] ENDOSCOPY;  Service: Gastroenterology;  Laterality: N/A;  . HYDROCELE EXCISION Bilateral 02/22/2018   Procedure: bilateral HYDROCELECTOMY ADULT;  Surgeon: Cleon Gustin, MD;  Location: [place] [facility];  Service: Urology;  Laterality: Bilateral;  . INSERTION OF ILIAC STENT Left 02/22/2018   Procedure: INSERTION LEFT SUPERIOR FEMORAL ARTERY USING 6MM X 5CM VIABHON STENT with mynx device closure on right femoral artery;  Surgeon: Waynetta Sandy, MD;  Location: [facility];  Service: Vascular;  Laterality: Left;  . KNEE ARTHROSCOPY     . SCROTAL EXPLORATION N/A 02/22/2018   Procedure: SCROTUM EXPLORATION;  Surgeon: Cleon Gustin, MD;  Location: [facility];  Service: Urology;  Laterality: N/A;  . UPPER EXTREMITY ANGIOGRAM Left 02/22/2018   Procedure: left lower EXTREMITY ANGIOGRAM;  Surgeon: Waynetta Sandy, MD;  Location: [facility] OR;  Service: Vascular;  Laterality: Left;   Family History:  Family History  Problem Relation Age of Onset  . Diabetes Mother    Family Psychiatric  History: See prior Social History:  Social History   Substance and Sexual Activity  Alcohol Use No     Social History   Substance and Sexual Activity  Drug Use No    Social History   Socioeconomic History  . Marital status: Single    Spouse name: Not on file  . Number of children: Not on file  . Years of education: Not on file  . Highest education level: Not on file  Occupational History  . Occupation: Disabled  Social Needs  . Financial resource strain: Not on file  . Food insecurity    Worry: Not on file    Inability: Not on file  . Transportation needs    Medical: Not on file    Non-medical: Not on file  Tobacco Use  . Smoking status: Former Smoker    Types: Cigarettes  . Smokeless tobacco: Never Used  Substance and Sexual Activity  . Alcohol use: No  . Drug use: No  . Sexual activity: Never  Lifestyle  . Physical activity    Days per week: Not on file    Minutes per session: Not on file  .  Stress: Not on file  Relationships  . Social Herbalist on phone: Not on file    Gets together: Not on file    Attends religious service: Not on file    Active member of club or organization: Not on file    Attends meetings of clubs or organizations: Not on file    Relationship status: Not on file  Other Topics Concern  . Not on file  Social History Narrative   ** Merged History Encounter **       Additional Social History:                         Sleep: Good  Appetite:  Good  Current  Medications: Current Facility-Administered Medications  Medication Dose Route Frequency Provider Last Rate Last Dose  . acetaminophen (TYLENOL) tablet 650 mg  650 mg Oral Q6H PRN Patrecia Pour, NP   650 mg at 09/09/18 1253  . alum & mag hydroxide-simeth (MAALOX/MYLANTA) 200-200-20 MG/5ML suspension 30 mL  30 mL Oral Q4H PRN Patrecia Pour, NP   30 mL at 09/30/18 1555  . atropine 1 % ophthalmic solution 2 drop  2 drop Sublingual QID Sharma Covert, MD   2 drop at 10/01/18 1635  . clopidogrel (PLAVIX) tablet 75 mg  75 mg Oral Daily Patrecia Pour, NP   75 mg at 10/01/18 0800  . cloZAPine (CLOZARIL) tablet 300 mg  300 mg Oral QHS Sharma Covert, MD   300 mg at 09/30/18 2122  . divalproex (DEPAKOTE) DR tablet 750 mg  750 mg Oral BID Sharma Covert, MD   750 mg at 10/01/18 1634  . gabapentin (NEURONTIN) capsule 300 mg  300 mg Oral TID Johnn Hai, MD   300 mg at 10/01/18 1635  . haloperidol (HALDOL) tablet 5 mg  5 mg Oral Q6H PRN Johnn Hai, MD   5 mg at 09/15/18 1846   Or  . haloperidol lactate (HALDOL) injection 10 mg  10 mg Intramuscular Q6H PRN Johnn Hai, MD      . haloperidol (HALDOL) tablet 5 mg  5 mg Oral Q6H PRN Sharma Covert, MD   5 mg at 09/27/18 2119   Or  . haloperidol lactate (HALDOL) injection 10 mg  10 mg Intramuscular Q6H PRN Sharma Covert, MD   10 mg at 09/13/18 1703  . LORazepam (ATIVAN) tablet 1 mg  1 mg Oral Q6H PRN Johnn Hai, MD   1 mg at 09/20/18 2111   Or  . LORazepam (ATIVAN) injection 2 mg  2 mg Intramuscular Q4H PRN Johnn Hai, MD   2 mg at 09/03/18 0044  . LORazepam (ATIVAN) tablet 1 mg  1 mg Oral BID Johnn Hai, MD   1 mg at 10/01/18 1635  . magnesium hydroxide (MILK OF MAGNESIA) suspension 30 mL  30 mL Oral Daily PRN Patrecia Pour, NP   30 mL at 09/11/18 1136  . multivitamin with minerals tablet 1 tablet  1 tablet Oral Daily Meyah Corle, [name], MD   1 tablet at 10/01/18 0800  . pantoprazole (PROTONIX) EC tablet 40 mg  40 mg Oral BID  Johnn Hai, MD   40 mg at 10/01/18 1635  . zolpidem (AMBIEN) tablet 5 mg  5 mg Oral QHS Brittinee Risk, [name], MD   5 mg at 09/30/18 2122    Lab Results: No results found for this or any previous visit (from the past 53  hour(s)).  Blood Alcohol level:  Lab Results  Component Value Date   ETH <10 08/31/2018   ETH <10 46/27/0350    Metabolic Disorder Labs: Lab Results  Component Value Date   HGBA1C 5.4 09/30/2016   MPG 108.28 09/30/2016   MPG 114 09/17/2016   No results found for: PROLACTIN Lab Results  Component Value Date   CHOL 135 09/30/2016   TRIG 104 02/25/2018   HDL 60 09/30/2016   CHOLHDL 2.3 09/30/2016   VLDL 12 09/30/2016   LDLCALC 63 09/30/2016    Physical Findings: AIMS: Facial and Oral Movements Muscles of Facial Expression: None, normal Lips and Perioral Area: None, normal Jaw: None, normal Tongue: None, normal,Extremity Movements Upper (arms, wrists, hands, fingers): None, normal Lower (legs, knees, ankles, toes): None, normal, Trunk Movements Neck, shoulders, hips: None, normal, Overall Severity Severity of abnormal movements (highest score from questions above): None, normal Incapacitation due to abnormal movements: None, normal Patient's awareness of abnormal movements (rate only patient's report): No Awareness, Dental Status Current problems with teeth and/or dentures?: No Does patient usually wear dentures?: No  CIWA:  CIWA-Ar Total: 2 COWS:  COWS Total Score: 4  Musculoskeletal: Strength & Muscle Tone: within normal limits Gait & Station: normal Patient leans: N/A  Psychiatric Specialty Exam: Physical Exam  Nursing note and vitals reviewed. Constitutional: He appears well-developed and well-nourished.  HENT:  Head: Normocephalic and atraumatic.  Eyes: Pupils are equal, round, and reactive to light. Conjunctivae are normal.  Neck: Normal range of motion.  Cardiovascular: Regular rhythm and normal heart sounds.  Respiratory: Effort normal. No  respiratory distress.  GI: Soft.  Musculoskeletal: Normal range of motion.  Neurological: He is alert.  Skin: Skin is warm and dry.  Psychiatric: He has a normal mood and affect. His behavior is normal. Judgment and thought content normal.    Review of Systems  Constitutional: Negative.   HENT: Negative.   Eyes: Negative.   Respiratory: Negative.   Cardiovascular: Negative.   Gastrointestinal: Negative.   Musculoskeletal: Negative.   Skin: Negative.   Neurological: Negative.   Psychiatric/Behavioral: Negative.     Blood pressure (!) 130/97, pulse 94, temperature 97.7 F (36.5 C), temperature source Oral, resp. rate 18, height 5\' 11"  (1.803 m), weight 67.1 kg, SpO2 94 %.Body mass index is 20.64 kg/m.  General Appearance: Casual  Eye Contact:  Good  Speech:  Clear and Coherent  Volume:  Normal  Mood:  Euthymic  Affect:  Constricted  Thought Process:  Coherent  Orientation:  Full (Time, Place, and Person)  Thought Content:  Logical  Suicidal Thoughts:  No  Homicidal Thoughts:  No  Memory:  Immediate;   Fair Recent;   Fair Remote;   Fair  Judgement:  Fair  Insight:  Fair  Psychomotor Activity:  Decreased  Concentration:  Concentration: Poor  Recall:  Poor  Fund of Knowledge:  Poor  Language:  Poor  Akathisia:  No  Handed:  Right  AIMS (if indicated):     Assets:  Housing Physical Health Resilience Social Support  ADL's:  Impaired  Cognition:  Impaired,  Moderate  Sleep:  Number of Hours: 6.25     Treatment Plan Summary: Daily contact with patient to assess and evaluate symptoms and progress in treatment, Medication management and Plan No change to medication management.  Continue thickened liquids diet.  Keep reassuring patient that discharge should be coming by the end of the week.  Alethia Berthold, MD 10/01/2018, 4:37 PM

## 2018-10-01 NOTE — Progress Notes (Signed)
Recreation Therapy Notes  Date: 10/01/2018  Time: 9:30 am  Location: Craft Room  Behavioral response: redirection needed  Intervention Topic: Self-esteem  Discussion/Intervention:  Group content today was focused on self-esteem. Patient defined self-esteem and where it comes form. The group described reasons self-esteem is important. Individuals stated things that impact self-esteem and positive ways to improve self-esteem. The group participated in the intervention "Improving Me" where patients were able to identify and explore ways to improve self-esteem. Clinical Observations/Feedback:  Patient needed much redirection from group facilitator to focus on the topic and task at hand. Individual focused on what time group would be ended.   Manfred Laspina LRT/CTRS         Nayzeth Altman 10/01/2018 11:24 AM

## 2018-10-01 NOTE — [facility] Group Notes (Signed)
[place] Group Notes:  (Nursing/MHT/Case Management/Adjunct)  Date:  10/01/2018  Time:  9:04 PM  Type of Therapy:  Group Therapy  Participation Level:  Active  Participation Quality:  Redirectable  Affect:  Appropriate  Cognitive:  Alert  Insight:  Good  Engagement in Group:  Engaged  Modes of Intervention:  Problem-solving  Summary of Progress/Problems:  Austin Gates 10/01/2018, 9:04 PM

## 2018-10-02 NOTE — Plan of Care (Signed)
Pt denies depression, anxiety, SI, HI and AVH. Pt was educated on care plan and verbalizes understanding. [name] RN  Problem: Activity: Goal: Will identify at least one activity in which they can participate Outcome: Progressing   Problem: Coping: Goal: Ability to identify and develop effective coping behavior will improve Outcome: Progressing Goal: Ability to interact with others will improve Outcome: Progressing Goal: Demonstration of participation in decision-making regarding own care will improve Outcome: Progressing Goal: Ability to use eye contact when communicating with others will improve Outcome: Progressing   Problem: Health Behavior/Discharge Planning: Goal: Identification of resources available to assist in meeting health care needs will improve Outcome: Progressing   Problem: Self-Concept: Goal: Will verbalize positive feelings about self Outcome: Progressing   Problem: Education: Goal: Utilization of techniques to improve thought processes will improve Outcome: Progressing Goal: Knowledge of the prescribed therapeutic regimen will improve Outcome: Progressing   Problem: Activity: Goal: Interest or engagement in leisure activities will improve Outcome: Progressing Goal: Imbalance in normal sleep/wake cycle will improve Outcome: Progressing   Problem: Coping: Goal: Coping ability will improve Outcome: Progressing Goal: Will verbalize feelings Outcome: Progressing   Problem: Health Behavior/Discharge Planning: Goal: Ability to make decisions will improve Outcome: Progressing Goal: Compliance with therapeutic regimen will improve Outcome: Progressing   Problem: Role Relationship: Goal: Will demonstrate positive changes in social behaviors and relationships Outcome: Progressing   Problem: Safety: Goal: Ability to disclose and discuss suicidal ideas will improve Outcome: Progressing Goal: Ability to identify and utilize support systems that promote  safety will improve Outcome: Progressing   Problem: Self-Concept: Goal: Will verbalize positive feelings about self Outcome: Progressing Goal: Level of anxiety will decrease Outcome: Progressing   Problem: Education: Goal: Ability to verbalize precipitating factors for violent behavior will improve Outcome: Progressing   Problem: Coping: Goal: Ability to verbalize frustrations and anger appropriately will improve Outcome: Progressing   Problem: Health Behavior/Discharge Planning: Goal: Ability to implement measures to prevent violent behavior in the future will improve Outcome: Progressing   Problem: Safety: Goal: Ability to demonstrate self-control will improve Outcome: Progressing Goal: Ability to redirect hostility and anger into socially appropriate behaviors will improve Outcome: Progressing

## 2018-10-02 NOTE — Progress Notes (Signed)
Patient is cooperative with treatment , he takes his medications with out any side effects noted, patient verbalizes that he maybe discharged this week, patient is seen in the milieu socializing with peers but in the remains intrusive.  Patient is currently resting in bed at this time, patient denies any SI/HI/AVH on shift.

## 2018-10-02 NOTE — Progress Notes (Signed)
Recreation Therapy Notes   Date: 10/02/2018  Time: 9:30 am  Location: Craft Room  Behavioral response: Appropriate  Intervention Topic: Coping-skills  Discussion/Intervention:  Group content on today was focused on coping skills. The group defined what coping skills are and when they can be used. Individuals described how they normally cope with thing and the coping skills they normally use. Patients expressed why it is important to cope with things and how not coping with things can affect you. The group participated in the intervention "Exploring coping skills" where they had a chance to test new coping skills they could use in the future.  Clinical Observations/Feedback:  Patient came to group and stated that cooking is his coping skill.  Individual was social with peers and staff while participating in the intervention.  Nechama Escutia LRT/CTRS          Austin Gates 10/02/2018 12:41 PM

## 2018-10-02 NOTE — Progress Notes (Signed)
[facility] MD Progress Note  10/02/2018 4:58 PM Austin Gates  MRN:  [id number] Subjective: Follow-up for patient with schizoaffective disorder and intellectual disability.  Patient has no new complaints today.  As usual he was a little bit intrusive wanting to discuss his discharge date but in a pleasant way and was easily redirected.  No violence no threats no inappropriate behavior. 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 Hypertension   . Intellectual disability   . Obsessive-compulsive disorder   . Schizo-affective schizophrenia [facility])     Past Surgical History:  Procedure Laterality Date  . COLONOSCOPY WITH PROPOFOL N/A 12/28/2017   Procedure: COLONOSCOPY WITH PROPOFOL;  Surgeon: Jonathon Bellows, MD;  Location: [facility] ENDOSCOPY;  Service: Gastroenterology;  Laterality: N/A;  . HYDROCELE EXCISION Bilateral 02/22/2018   Procedure: bilateral HYDROCELECTOMY ADULT;  Surgeon: Cleon Gustin, MD;  Location: [place];  Service: Urology;  Laterality: Bilateral;  . INSERTION OF ILIAC STENT Left 02/22/2018   Procedure: INSERTION LEFT SUPERIOR FEMORAL ARTERY USING 6MM X 5CM VIABHON STENT with mynx device closure on right femoral artery;  Surgeon: Waynetta Sandy, MD;  Location: [place];  Service: Vascular;  Laterality: Left;  . KNEE ARTHROSCOPY    . SCROTAL EXPLORATION N/A 02/22/2018   Procedure: SCROTUM EXPLORATION;  Surgeon: Cleon Gustin, MD;  Location: [place];  Service: Urology;  Laterality: N/A;  . UPPER EXTREMITY ANGIOGRAM Left 02/22/2018   Procedure:  left lower EXTREMITY ANGIOGRAM;  Surgeon: Waynetta Sandy, MD;  Location: [facility] OR;  Service: Vascular;  Laterality: Left;   Family History:  Family History  Problem Relation Age of Onset  . Diabetes Mother    Family Psychiatric  History: See previous Social History:  Social History   Substance and Sexual Activity  Alcohol Use No     Social History   Substance and Sexual Activity  Drug Use No    Social History   Socioeconomic History  . Marital status: Single    Spouse name: Not on file  . Number of children: Not on file  . Years of education: Not on file  . Highest education level: Not on file  Occupational History  . Occupation: Disabled  Social Needs  . Financial resource strain: Not on file  . Food insecurity    Worry: Not on file    Inability: Not on file  . Transportation needs    Medical: Not on file    Non-medical: Not on file  Tobacco Use  . Smoking status: Former Smoker    Types: Cigarettes  . Smokeless tobacco: Never Used  Substance and Sexual Activity  . Alcohol use: No  . Drug use: No  . Sexual activity: Never  Lifestyle  . Physical activity    Days per week: Not on file    Minutes per session: Not on file  . Stress: Not on file  Relationships  . Social Herbalist on phone: Not on file    Gets together: Not on file    Attends religious service: Not on file    Active member of club or organization: Not on file  Attends meetings of clubs or organizations: Not on file    Relationship status: Not on file  Other Topics Concern  . Not on file  Social History Narrative   ** Merged History Encounter **       Additional Social History:                         Sleep: Fair  Appetite:  Fair  Current Medications: Current Facility-Administered Medications  Medication Dose Route Frequency Provider Last Rate Last Dose  . acetaminophen (TYLENOL) tablet 650 mg  650 mg Oral Q6H PRN Patrecia Pour, NP   650 mg at  09/09/18 1253  . alum & mag hydroxide-simeth (MAALOX/MYLANTA) 200-200-20 MG/5ML suspension 30 mL  30 mL Oral Q4H PRN Patrecia Pour, NP   30 mL at 10/01/18 1740  . atropine 1 % ophthalmic solution 2 drop  2 drop Sublingual QID Sharma Covert, MD   2 drop at 10/02/18 1236  . clopidogrel (PLAVIX) tablet 75 mg  75 mg Oral Daily Patrecia Pour, NP   75 mg at 10/02/18 0750  . cloZAPine (CLOZARIL) tablet 300 mg  300 mg Oral QHS Sharma Covert, MD   300 mg at 10/01/18 2113  . divalproex (DEPAKOTE) DR tablet 750 mg  750 mg Oral BID Sharma Covert, MD   750 mg at 10/02/18 0750  . gabapentin (NEURONTIN) capsule 300 mg  300 mg Oral TID Johnn Hai, MD   300 mg at 10/02/18 1235  . haloperidol (HALDOL) tablet 5 mg  5 mg Oral Q6H PRN Johnn Hai, MD   5 mg at 09/15/18 1846   Or  . haloperidol lactate (HALDOL) injection 10 mg  10 mg Intramuscular Q6H PRN Johnn Hai, MD      . haloperidol (HALDOL) tablet 5 mg  5 mg Oral Q6H PRN Sharma Covert, MD   5 mg at 09/27/18 2119   Or  . haloperidol lactate (HALDOL) injection 10 mg  10 mg Intramuscular Q6H PRN Sharma Covert, MD   10 mg at 09/13/18 1703  . LORazepam (ATIVAN) tablet 1 mg  1 mg Oral Q6H PRN Johnn Hai, MD   1 mg at 09/20/18 2111   Or  . LORazepam (ATIVAN) injection 2 mg  2 mg Intramuscular Q4H PRN Johnn Hai, MD   2 mg at 09/03/18 0044  . LORazepam (ATIVAN) tablet 1 mg  1 mg Oral BID Johnn Hai, MD   1 mg at 10/02/18 0751  . magnesium hydroxide (MILK OF MAGNESIA) suspension 30 mL  30 mL Oral Daily PRN Patrecia Pour, NP   30 mL at 09/11/18 1136  . multivitamin with minerals tablet 1 tablet  1 tablet Oral Daily Zebbie Ace, [name], MD   1 tablet at 10/02/18 0750  . pantoprazole (PROTONIX) EC tablet 40 mg  40 mg Oral BID Johnn Hai, MD   40 mg at 10/02/18 0750  . zolpidem (AMBIEN) tablet 5 mg  5 mg Oral QHS Danel Requena, [name], MD   5 mg at 10/01/18 2113    Lab Results: No results found for this or any previous visit (from the  past 48 hour(s)).  Blood Alcohol level:  Lab Results  Component Value Date   [facility] <10 08/31/2018   ETH <10 59/56/3875    Metabolic Disorder Labs: Lab Results  Component Value Date   HGBA1C 5.4 09/30/2016   MPG 108.28 09/30/2016   MPG 114 09/17/2016  No results found for: PROLACTIN Lab Results  Component Value Date   CHOL 135 09/30/2016   TRIG 104 02/25/2018   HDL 60 09/30/2016   CHOLHDL 2.3 09/30/2016   VLDL 12 09/30/2016   LDLCALC 63 09/30/2016    Physical Findings: AIMS: Facial and Oral Movements Muscles of Facial Expression: None, normal Lips and Perioral Area: None, normal Jaw: None, normal Tongue: None, normal,Extremity Movements Upper (arms, wrists, hands, fingers): None, normal Lower (legs, knees, ankles, toes): None, normal, Trunk Movements Neck, shoulders, hips: None, normal, Overall Severity Severity of abnormal movements (highest score from questions above): None, normal Incapacitation due to abnormal movements: None, normal Patient's awareness of abnormal movements (rate only patient's report): No Awareness, Dental Status Current problems with teeth and/or dentures?: No Does patient usually wear dentures?: No  CIWA:  CIWA-Ar Total: 2 COWS:  COWS Total Score: 4  Musculoskeletal: Strength & Muscle Tone: within normal limits Gait & Station: normal Patient leans: N/A  Psychiatric Specialty Exam: Physical Exam  Nursing note and vitals reviewed. Constitutional: He appears well-developed and well-nourished.  HENT:  Head: Normocephalic and atraumatic.  Eyes: Pupils are equal, round, and reactive to light. Conjunctivae are normal.  Neck: Normal range of motion.  Cardiovascular: Regular rhythm and normal heart sounds.  Respiratory: Effort normal.  GI: Soft.  Musculoskeletal: Normal range of motion.  Neurological: He is alert.  Skin: Skin is warm and dry.  Psychiatric: His affect is blunt. His speech is delayed. He is slowed. Thought content is not  paranoid. Cognition and memory are impaired. He expresses impulsivity. He expresses no homicidal and no suicidal ideation.    Review of Systems  Constitutional: Negative.   HENT: Negative.   Eyes: Negative.   Respiratory: Negative.   Cardiovascular: Negative.   Gastrointestinal: Negative.   Musculoskeletal: Negative.   Skin: Negative.   Neurological: Negative.   Psychiatric/Behavioral: Negative.     Blood pressure (!) 126/95, pulse (!) 101, temperature 97.8 F (36.6 C), temperature source Oral, resp. rate 16, height 5\' 11"  (1.803 m), weight 67.1 kg, SpO2 94 %.Body mass index is 20.64 kg/m.  General Appearance: Casual  Eye Contact:  Good  Speech:  Garbled, Slow and Slurred  Volume:  Decreased  Mood:  Euthymic  Affect:  Constricted  Thought Process:  Goal Directed  Orientation:  Full (Time, Place, and Person)  Thought Content:  Logical  Suicidal Thoughts:  No  Homicidal Thoughts:  No  Memory:  Immediate;   Good Recent;   Fair Remote;   Fair  Judgement:  Impaired  Insight:  Shallow  Psychomotor Activity:  Decreased  Concentration:  Concentration: Fair  Recall:  [employer] of Knowledge:  Fair  Language:  Fair  Akathisia:  No  Handed:  Right  AIMS (if indicated):     Assets:  Desire for Improvement  ADL's:  Intact  Cognition:  WNL  Sleep:  Number of Hours: 8     Treatment Plan Summary: Daily contact with patient to assess and evaluate symptoms and progress in treatment, Medication management and Plan Patient appears to continue to be stable.  We are likely to see admission to a facility by the end of the week.  Preparations underway.  Loss of support and soothing counseling to the patient.  No change to medicine  Alethia Berthold, MD 10/02/2018, 4:58 PM

## 2018-10-03 MED ORDER — PANTOPRAZOLE SODIUM 40 MG PO TBEC: 40.0000 mg | DELAYED_RELEASE_TABLET | Freq: Two times a day (BID) | ORAL | 0 refills | Status: DC

## 2018-10-03 MED ORDER — GABAPENTIN 300 MG PO CAPS: 300.0000 mg | ORAL_CAPSULE | Freq: Three times a day (TID) | ORAL | 2 refills | Status: DC

## 2018-10-03 MED ORDER — CLOZAPINE 100 MG PO TABS: 300.0000 mg | ORAL_TABLET | Freq: Every day | ORAL | 0 refills | Status: DC

## 2018-10-03 MED ORDER — LORAZEPAM 1 MG PO TABS: 1.0000 mg | ORAL_TABLET | Freq: Two times a day (BID) | ORAL | 2 refills | Status: DC

## 2018-10-03 MED ORDER — PANTOPRAZOLE SODIUM 40 MG PO TBEC: 40.0000 mg | DELAYED_RELEASE_TABLET | Freq: Two times a day (BID) | ORAL | 2 refills | Status: DC

## 2018-10-03 MED ORDER — DIVALPROEX SODIUM 250 MG PO DR TAB: 750.0000 mg | DELAYED_RELEASE_TABLET | Freq: Two times a day (BID) | ORAL | 0 refills | Status: DC

## 2018-10-03 MED ORDER — ADULT MULTIVITAMIN W/MINERALS CH: 1.0000 | ORAL_TABLET | Freq: Every day | ORAL | 0 refills | Status: DC

## 2018-10-03 MED ORDER — CLOZAPINE 100 MG PO TABS: 300.0000 mg | ORAL_TABLET | Freq: Every day | ORAL | 2 refills | Status: DC

## 2018-10-03 MED ORDER — GABAPENTIN 300 MG PO CAPS: 300.0000 mg | ORAL_CAPSULE | Freq: Three times a day (TID) | ORAL | 0 refills | Status: DC

## 2018-10-03 MED ORDER — CLOPIDOGREL BISULFATE 75 MG PO TABS: 75.0000 mg | ORAL_TABLET | Freq: Every day | ORAL | 0 refills | Status: DC

## 2018-10-03 MED ORDER — ZOLPIDEM TARTRATE 5 MG PO TABS: 5.0000 mg | ORAL_TABLET | Freq: Every day | ORAL | 2 refills | Status: DC

## 2018-10-03 MED ORDER — HALOPERIDOL 5 MG PO TABS: 5.0000 mg | ORAL_TABLET | Freq: Four times a day (QID) | ORAL | 0 refills | Status: DC | PRN

## 2018-10-03 MED ORDER — HALOPERIDOL 5 MG PO TABS: 5.0000 mg | ORAL_TABLET | Freq: Four times a day (QID) | ORAL | 2 refills | Status: DC | PRN

## 2018-10-03 MED ORDER — ADULT MULTIVITAMIN W/MINERALS CH: 1.0000 | ORAL_TABLET | Freq: Every day | ORAL | 2 refills | Status: DC

## 2018-10-03 MED ORDER — CLOPIDOGREL BISULFATE 75 MG PO TABS: 75.0000 mg | ORAL_TABLET | Freq: Every day | ORAL | 2 refills | Status: DC

## 2018-10-03 MED ORDER — ATROPINE SULFATE 1 % OP SOLN: 2.0000 [drp] | Freq: Four times a day (QID) | OPHTHALMIC | 2 refills | Status: DC

## 2018-10-03 MED ORDER — ATROPINE SULFATE 1 % OP SOLN: 2.0000 [drp] | Freq: Four times a day (QID) | OPHTHALMIC | 0 refills | Status: DC

## 2018-10-03 MED ORDER — DIVALPROEX SODIUM 250 MG PO DR TAB: 750.0000 mg | DELAYED_RELEASE_TABLET | Freq: Two times a day (BID) | ORAL | 2 refills | Status: DC

## 2018-10-03 NOTE — Progress Notes (Signed)
Recreation Therapy Notes   Date: 10/03/2018  Time: 9:30 am  Location: Craft Room  Behavioral response: Appropriate  Intervention Topic: Goals  Discussion/Intervention:  Group content on today was focused on goals. Patients described what goals are and how they define goals. Individuals expressed how they go about setting goals and reaching them. The group identified how important goals are and if they make short term goals to reach long term goals. Patients described how many goals they work on at a time and what affects them not reaching their goal. Individuals described how much time they put into planning and obtaining their goals. The group participated in the intervention "My Goal Board" and made personal goal boards to help them achieve their goal. Clinical Observations/Feedback:  Patient came to group.  Briarrose Shor LRT/CTRS         Kregg Cihlar 10/03/2018 12:32 PM

## 2018-10-03 NOTE — Plan of Care (Signed)
Pt denies depression, anxiety, SI, HI and AVH. Pt is calm, cooperative and assertive. Pt was educated on care plan and verbalizes understanding. [name] RN Problem: Activity: Goal: Will identify at least one activity in which they can participate Outcome: Progressing   Problem: Coping: Goal: Ability to identify and develop effective coping behavior will improve Outcome: Progressing Goal: Ability to interact with others will improve Outcome: Progressing Goal: Demonstration of participation in decision-making regarding own care will improve Outcome: Progressing Goal: Ability to use eye contact when communicating with others will improve Outcome: Progressing   Problem: Health Behavior/Discharge Planning: Goal: Identification of resources available to assist in meeting health care needs will improve Outcome: Progressing   Problem: Self-Concept: Goal: Will verbalize positive feelings about self Outcome: Progressing   Problem: Education: Goal: Utilization of techniques to improve thought processes will improve Outcome: Progressing Goal: Knowledge of the prescribed therapeutic regimen will improve Outcome: Progressing   Problem: Activity: Goal: Interest or engagement in leisure activities will improve Outcome: Progressing Goal: Imbalance in normal sleep/wake cycle will improve Outcome: Progressing   Problem: Coping: Goal: Coping ability will improve Outcome: Progressing Goal: Will verbalize feelings Outcome: Progressing   Problem: Health Behavior/Discharge Planning: Goal: Ability to make decisions will improve Outcome: Progressing Goal: Compliance with therapeutic regimen will improve Outcome: Progressing   Problem: Role Relationship: Goal: Will demonstrate positive changes in social behaviors and relationships Outcome: Progressing   Problem: Safety: Goal: Ability to disclose and discuss suicidal ideas will improve Outcome: Progressing Goal: Ability to identify and  utilize support systems that promote safety will improve Outcome: Progressing   Problem: Self-Concept: Goal: Will verbalize positive feelings about self Outcome: Progressing Goal: Level of anxiety will decrease Outcome: Progressing   Problem: Education: Goal: Ability to verbalize precipitating factors for violent behavior will improve Outcome: Progressing   Problem: Coping: Goal: Ability to verbalize frustrations and anger appropriately will improve Outcome: Progressing   Problem: Health Behavior/Discharge Planning: Goal: Ability to implement measures to prevent violent behavior in the future will improve Outcome: Progressing   Problem: Safety: Goal: Ability to demonstrate self-control will improve Outcome: Progressing Goal: Ability to redirect hostility and anger into socially appropriate behaviors will improve Outcome: Progressing

## 2018-10-03 NOTE — BHH Suicide Risk Assessment (Signed)
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   HENT: Negative.   Eyes: Negative.   Respiratory: Negative.   Cardiovascular: Negative.   Gastrointestinal: Negative.   Musculoskeletal: Negative.   Skin: Negative.   Neurological: Negative.   Psychiatric/Behavioral: Negative for depression, hallucinations, memory loss, substance abuse and suicidal ideas. The patient is not nervous/anxious and does not have insomnia.     Blood pressure 117/80, pulse 100, temperature 98 F (36.7 C), temperature source Oral, resp. rate 18, height 5\' 11"  (1.803 m), weight 67.1 kg, SpO2 98 %.Body mass index is 20.64 kg/m.  General Appearance: Casual  Eye Contact::  Fair  Speech:  Garbled, Slow and Slurred409  Volume:  Decreased  Mood:  Euthymic  Affect:  Constricted  Thought Process:  Disorganized  Orientation:  Negative  Thought Content:  Rumination  Suicidal Thoughts:  No  Homicidal Thoughts:  No  Memory:  Negative  Judgement:  Negative  Insight:  Shallow  Psychomotor Activity:  Restlessness  Concentration:  Poor  Recall:  Poor  Fund of Knowledge:Poor  Language: Poor  Akathisia:  No  Handed:  Right  AIMS (if indicated):     Assets:  Social Support  Sleep:  Number of Hours: 7.75  Cognition: Impaired,  Moderate  ADL's:  Impaired   Mental Status Per Nursing Assessment::   On Admission:  NA  Demographic Factors:  Male  Loss Factors: Financial problems/change in socioeconomic status  Historical  Factors: Impulsivity  Risk Reduction Factors:   Living with another person, especially a relative, Positive social support and Positive therapeutic relationship  Continued Clinical Symptoms:  Schizophrenia:   Paranoid or undifferentiated type  Cognitive Features That Contribute To Risk:  None    Suicide Risk:  Minimal: No identifiable suicidal ideation.  Patients presenting with no risk factors but with morbid ruminations; may be classified as minimal risk based on the severity of the depressive symptoms  [place] Follow up on 10/12/2018.   Why: You have an appointment scheduled with Dr. Loni Muse on 10/12/18 at 11:30AM. Guardian needs to be present at appointment and bring photo id [both patient and guardian]. Also bring insurance card, discharge summary and wear masks. Thank You! Contact information: [address] [place] [address] [phone]           Plan Of Care/Follow-up recommendations:  Other:  Normal activity and diet.  Prescriptions to be provided at discharge.  Patient does not have any acute risk of suicidal behavior.  Alethia Berthold, MD 10/03/2018, 5:30 PM

## 2018-10-03 NOTE — Progress Notes (Signed)
[facility] MD Progress Note  10/03/2018 5:25 PM Austin Gates  MRN:  [id number] Subjective: Follow-up for patient with schizoaffective disorder and developmental disability.  No new complaints today.  He knows that he is being discharged soon and remains anxious about it but pleasant.  No threats no aggression.  No new physical complaints. 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 Hypertension   . Intellectual disability   . Obsessive-compulsive disorder   . Schizo-affective schizophrenia [facility])     Past Surgical History:  Procedure Laterality Date  . COLONOSCOPY WITH PROPOFOL N/A 12/28/2017   Procedure: COLONOSCOPY WITH PROPOFOL;  Surgeon: Jonathon Bellows, MD;  Location: [facility] ENDOSCOPY;  Service: Gastroenterology;  Laterality: N/A;  . HYDROCELE EXCISION Bilateral 02/22/2018   Procedure: bilateral HYDROCELECTOMY ADULT;  Surgeon: Cleon Gustin, MD;  Location: [place];  Service: Urology;  Laterality: Bilateral;  . INSERTION OF ILIAC STENT Left 02/22/2018   Procedure: INSERTION LEFT SUPERIOR FEMORAL ARTERY USING 6MM X 5CM VIABHON STENT with mynx device closure on right femoral artery;  Surgeon: Waynetta Sandy, MD;  Location: [place];  Service: Vascular;  Laterality: Left;  . KNEE ARTHROSCOPY    . SCROTAL EXPLORATION N/A 02/22/2018   Procedure: SCROTUM EXPLORATION;  Surgeon: Cleon Gustin, MD;  Location: [place];  Service: Urology;  Laterality: N/A;  . UPPER EXTREMITY ANGIOGRAM Left 02/22/2018   Procedure: left lower EXTREMITY ANGIOGRAM;  Surgeon: Waynetta Sandy, MD;  Location: [facility] OR;  Service:  Vascular;  Laterality: Left;   Family History:  Family History  Problem Relation Age of Onset  . Diabetes Mother    Family Psychiatric  History: See previous Social History:  Social History   Substance and Sexual Activity  Alcohol Use No     Social History   Substance and Sexual Activity  Drug Use No    Social History   Socioeconomic History  . Marital status: Single    Spouse name: Not on file  . Number of children: Not on file  . Years of education: Not on file  . Highest education level: Not on file  Occupational History  . Occupation: Disabled  Social Needs  . Financial resource strain: Not on file  . Food insecurity    Worry: Not on file    Inability: Not on file  . Transportation needs    Medical: Not on file    Non-medical: Not on file  Tobacco Use  . Smoking status: Former Smoker    Types: Cigarettes  . Smokeless tobacco: Never Used  Substance and Sexual Activity  . Alcohol use: No  . Drug use: No  . Sexual activity: Never  Lifestyle  . Physical activity    Days per week: Not on file    Minutes per session: Not on file  . Stress: Not on file  Relationships  . Social Herbalist on phone: Not on file    Gets together: Not on file    Attends religious service: Not on file    Active member of club or organization: Not on file    Attends meetings of clubs or organizations: Not on file    Relationship  status: Not on file  Other Topics Concern  . Not on file  Social History Narrative   ** Merged History Encounter **       Additional Social History:                         Sleep: Good  Appetite:  Good  Current Medications: Current Facility-Administered Medications  Medication Dose Route Frequency Provider Last Rate Last Dose  . acetaminophen (TYLENOL) tablet 650 mg  650 mg Oral Q6H PRN Patrecia Pour, NP   650 mg at 09/09/18 1253  . alum & mag hydroxide-simeth (MAALOX/MYLANTA) 200-200-20 MG/5ML suspension 30 mL  30 mL  Oral Q4H PRN Patrecia Pour, NP   30 mL at 10/01/18 1740  . atropine 1 % ophthalmic solution 2 drop  2 drop Sublingual QID Sharma Covert, MD   2 drop at 10/03/18 1716  . clopidogrel (PLAVIX) tablet 75 mg  75 mg Oral Daily Patrecia Pour, NP   75 mg at 10/03/18 0740  . cloZAPine (CLOZARIL) tablet 300 mg  300 mg Oral QHS Sharma Covert, MD   300 mg at 10/02/18 2153  . divalproex (DEPAKOTE) DR tablet 750 mg  750 mg Oral BID Sharma Covert, MD   750 mg at 10/03/18 1715  . gabapentin (NEURONTIN) capsule 300 mg  300 mg Oral TID Johnn Hai, MD   300 mg at 10/03/18 1715  . haloperidol (HALDOL) tablet 5 mg  5 mg Oral Q6H PRN Johnn Hai, MD   5 mg at 09/15/18 1846   Or  . haloperidol lactate (HALDOL) injection 10 mg  10 mg Intramuscular Q6H PRN Johnn Hai, MD      . haloperidol (HALDOL) tablet 5 mg  5 mg Oral Q6H PRN Sharma Covert, MD   5 mg at 09/27/18 2119   Or  . haloperidol lactate (HALDOL) injection 10 mg  10 mg Intramuscular Q6H PRN Sharma Covert, MD   10 mg at 09/13/18 1703  . LORazepam (ATIVAN) tablet 1 mg  1 mg Oral Q6H PRN Johnn Hai, MD   1 mg at 09/20/18 2111   Or  . LORazepam (ATIVAN) injection 2 mg  2 mg Intramuscular Q4H PRN Johnn Hai, MD   2 mg at 09/03/18 0044  . LORazepam (ATIVAN) tablet 1 mg  1 mg Oral BID Johnn Hai, MD   1 mg at 10/03/18 1716  . magnesium hydroxide (MILK OF MAGNESIA) suspension 30 mL  30 mL Oral Daily PRN Patrecia Pour, NP   30 mL at 09/11/18 1136  . multivitamin with minerals tablet 1 tablet  1 tablet Oral Daily Bennett Vanscyoc, [name], MD   1 tablet at 10/03/18 0741  . pantoprazole (PROTONIX) EC tablet 40 mg  40 mg Oral BID Johnn Hai, MD   40 mg at 10/03/18 1715  . zolpidem (AMBIEN) tablet 5 mg  5 mg Oral QHS Lashia Niese, [name], MD   5 mg at 10/02/18 2154    Lab Results: No results found for this or any previous visit (from the past 48 hour(s)).  Blood Alcohol level:  Lab Results  Component Value Date   ETH <10 08/31/2018   ETH  <10 09/32/6712    Metabolic Disorder Labs: Lab Results  Component Value Date   HGBA1C 5.4 09/30/2016   MPG 108.28 09/30/2016   MPG 114 09/17/2016   No results found for: PROLACTIN Lab Results  Component Value Date  CHOL 135 09/30/2016   TRIG 104 02/25/2018   HDL 60 09/30/2016   CHOLHDL 2.3 09/30/2016   VLDL 12 09/30/2016   LDLCALC 63 09/30/2016    Physical Findings: AIMS: Facial and Oral Movements Muscles of Facial Expression: None, normal Lips and Perioral Area: None, normal Jaw: None, normal Tongue: None, normal,Extremity Movements Upper (arms, wrists, hands, fingers): None, normal Lower (legs, knees, ankles, toes): None, normal, Trunk Movements Neck, shoulders, hips: None, normal, Overall Severity Severity of abnormal movements (highest score from questions above): None, normal Incapacitation due to abnormal movements: None, normal Patient's awareness of abnormal movements (rate only patient's report): No Awareness, Dental Status Current problems with teeth and/or dentures?: No Does patient usually wear dentures?: No  CIWA:  CIWA-Ar Total: 2 COWS:  COWS Total Score: 4  Musculoskeletal: Strength & Muscle Tone: within normal limits Gait & Station: normal Patient leans: N/A  Psychiatric Specialty Exam: Physical Exam  Nursing note and vitals reviewed. Constitutional: He appears well-developed and well-nourished.  HENT:  Head: Normocephalic and atraumatic.  Eyes: Pupils are equal, round, and reactive to light. Conjunctivae are normal.  Neck: Normal range of motion.  Cardiovascular: Regular rhythm and normal heart sounds.  Respiratory: Effort normal.  GI: Soft.  Musculoskeletal: Normal range of motion.  Neurological: He is alert.  Skin: Skin is warm and dry.  Psychiatric: He has a normal mood and affect. His behavior is normal. Judgment and thought content normal.    Review of Systems  Constitutional: Negative.   HENT: Negative.   Eyes: Negative.    Respiratory: Negative.   Cardiovascular: Negative.   Gastrointestinal: Negative.   Musculoskeletal: Negative.   Skin: Negative.   Neurological: Negative.   Psychiatric/Behavioral: Negative.     Blood pressure 117/80, pulse 100, temperature 98 F (36.7 C), temperature source Oral, resp. rate 18, height 5\' 11"  (1.803 m), weight 67.1 kg, SpO2 98 %.Body mass index is 20.64 kg/m.  General Appearance: Casual  Eye Contact:  Good  Speech:  Garbled and Slurred  Volume:  Decreased  Mood:  Euthymic  Affect:  Constricted  Thought Process:  Disorganized  Orientation:  Negative  Thought Content:  Rumination  Suicidal Thoughts:  No  Homicidal Thoughts:  No  Memory:  Immediate;   Fair Recent;   Poor Remote;   Poor  Judgement:  Impaired  Insight:  Lacking  Psychomotor Activity:  Restlessness  Concentration:  Concentration: Poor  Recall:  Poor  Fund of Knowledge:  Poor  Language:  Poor  Akathisia:  No  Handed:  Right  AIMS (if indicated):     Assets:  Social Support  ADL's:  Impaired  Cognition:  Impaired,  Moderate  Sleep:  Number of Hours: 7.75     Treatment Plan Summary: Daily contact with patient to assess and evaluate symptoms and progress in treatment, Medication management and Plan No change to medication.  I have put in orders for a 10-day supply and will print out his prescriptions.  2 of his important medicines are controlled substances, Ambien and Ativan.  Group home should be aware that we cannot supply 10-day or 7-day or any in hand supply of those medicines.  I am offering to call in prescriptions early so they can pick up the 30-day prescription soon.  Patient aware he is being discharged this week.  We have not gotten his hopes up by specifying tomorrow in case there is disappointment but there is a good chance we are looking at discharge tomorrow to [facility].  Jenny Reichmann  Danaja Lasota, MD 10/03/2018, 5:25 PM

## 2018-10-03 NOTE — BHH Group Notes (Signed)
LCSW Group Therapy Note  10/03/2018 1:02 PM  Type of Therapy/Topic:  Group Therapy:  Emotion Regulation  Participation Level:  Minimal   Description of Group:   The purpose of this group is to assist patients in learning to regulate negative emotions and experience positive emotions. Patients will be guided to discuss ways in which they have been vulnerable to their negative emotions. These vulnerabilities will be juxtaposed with experiences of positive emotions or situations, and patients will be challenged to use positive emotions to combat negative ones. Special emphasis will be placed on coping with negative emotions in conflict situations, and patients will process healthy conflict resolution skills.  Therapeutic Goals: 1. Patient will identify two positive emotions or experiences to reflect on in order to balance out negative emotions 2. Patient will label two or more emotions that they find the most difficult to experience 3. Patient will demonstrate positive conflict resolution skills through discussion and/or role plays  Summary of Patient Progress: Patient was present in group.  Patient did not participate in group discussion.    Therapeutic Modalities:   Cognitive Behavioral Therapy Feelings Identification Dialectical Behavioral Therapy  Assunta Curtis, MSW, LCSW 10/03/2018 1:02 PM

## 2018-10-04 MED ORDER — DIVALPROEX SODIUM 250 MG PO DR TAB: 750.0000 mg | DELAYED_RELEASE_TABLET | Freq: Two times a day (BID) | ORAL | 2 refills | Status: DC

## 2018-10-04 MED ORDER — LORAZEPAM 1 MG PO TABS: 1.0000 mg | ORAL_TABLET | Freq: Two times a day (BID) | ORAL | 2 refills | Status: DC

## 2018-10-04 MED ORDER — ADULT MULTIVITAMIN W/MINERALS CH: 1.0000 | ORAL_TABLET | Freq: Every day | ORAL | 2 refills | Status: DC

## 2018-10-04 MED ORDER — CLOZAPINE 100 MG PO TABS: 300.0000 mg | ORAL_TABLET | Freq: Every day | ORAL | 2 refills | Status: DC

## 2018-10-04 MED ORDER — HALOPERIDOL 5 MG PO TABS: 5.0000 mg | ORAL_TABLET | Freq: Four times a day (QID) | ORAL | 2 refills | Status: DC | PRN

## 2018-10-04 MED ORDER — GABAPENTIN 300 MG PO CAPS: 300.0000 mg | ORAL_CAPSULE | Freq: Three times a day (TID) | ORAL | 2 refills | Status: DC

## 2018-10-04 MED ORDER — CLOPIDOGREL BISULFATE 75 MG PO TABS: 75.0000 mg | ORAL_TABLET | Freq: Every day | ORAL | 2 refills | Status: DC

## 2018-10-04 MED ORDER — PANTOPRAZOLE SODIUM 40 MG PO TBEC: 40.0000 mg | DELAYED_RELEASE_TABLET | Freq: Two times a day (BID) | ORAL | 2 refills | Status: DC

## 2018-10-04 MED ORDER — ZOLPIDEM TARTRATE 5 MG PO TABS: 5.0000 mg | ORAL_TABLET | Freq: Every day | ORAL | 2 refills | Status: DC

## 2018-10-04 MED ORDER — ATROPINE SULFATE 1 % OP SOLN: 2.0000 [drp] | Freq: Four times a day (QID) | OPHTHALMIC | 2 refills | Status: DC

## 2018-10-04 NOTE — Progress Notes (Signed)
  [facility] Adult Case Management Discharge Plan :  Will you be returning to the same living situation after discharge:  No.  Patient is discharging to a [place].  At discharge, do you have transportation home?: Yes,  group home will provide transportation. Do you have the ability to pay for your medications: Yes,  [facility]  Release of information consent forms completed and in the chart;  Patient's signature needed at discharge.  Patient to Follow up at: [place] up on 10/12/2018.   Why: You have an appointment scheduled with Dr. Loni Muse on 10/12/18 at 11:30AM. Guardian needs to be present at appointment and bring photo id [both patient and guardian]. Also bring insurance card, discharge summary and wear masks. Thank You! Contact information: [place] [address] [phone]           Next level of care provider has access to [place] and Suicide Prevention discussed: Yes,  SPE completed with the patient's sister and legal guardian.  Have you used any form of tobacco in the last 30 days? (Cigarettes, Smokeless Tobacco, Cigars, and/or Pipes): No  Has patient been referred to the Quitline?: N/A patient is not a smoker  Patient has been referred for addiction treatment: [place], LCSW 10/04/2018, 8:51 AM

## 2018-10-04 NOTE — BHH Counselor (Signed)
CSW emailed Henrietta Dine with [facility] to confirm that she received the discharge summary and the message regarding the patient's dietary needs being thick liquids due to concerns of aspirating.    [place] reports that she received the message and the fax.   Assunta Curtis, MSW, LCSW 10/04/2018 3:30 PM

## 2018-10-04 NOTE — Progress Notes (Signed)
Recreation Therapy Notes  INPATIENT RECREATION TR PLAN  Patient Details Name: Austin Gates MRN: [id number] DOB: 03/29/1960 Today's Date: 10/04/2018  Rec Therapy Plan Is patient appropriate for Therapeutic Recreation?: Yes Treatment times per week: At least 3 Estimated Length of Stay: 5-7 Days TR Treatment/Interventions: Group participation (Comment)  Discharge Criteria Pt will be discharged from therapy if:: Discharged Treatment plan/goals/alternatives discussed and agreed upon by:: Patient/family  Discharge Summary Short term goals set: Patient will engage in groups without prompting or encouragement from LRT x3 group sessions within 5 recreation therapy group sessions Short term goals met: Complete Progress toward goals comments: Groups attended Which groups?: Self-esteem, Goal setting, Coping skills, Stress management, Anger management, Leisure education, Other (Comment)(Values, Self-care, Problem-Solving, Relaxation, Time Management, Necessities) Reason goals not met: N/A Therapeutic equipment acquired: N/A Reason patient discharged from therapy: Discharge from hospital Pt/family agrees with progress & goals achieved: Yes Date patient discharged from therapy: 10/04/18   Shanautica Forker 10/04/2018, 1:54 PM

## 2018-10-04 NOTE — Discharge Summary (Signed)
Physician Discharge Summary Note  Patient:  Austin Gates is an 58 y.o., male MRN:  [id number] DOB:  [birth year] Patient phone:  [phone] (home)  Patient address:   [place] [address],  Total Time spent with patient: 45 minutes  Date of Admission:  08/31/2018 Date of Discharge: October 04, 2018  Reason for Admission: Patient was admitted to the hospital through the emergency room after presenting from his most recent living facility with agitated behavior some aggression and also what appeared to be confusion and possibly physical decompensation.  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  He has a long history of behavior problems related to both of these conditions and there has been a longstanding struggle to maintain him outside the hospital.  He has responded to medications but also has a history of several episodes of delirium or altered mental status when medication dosages become extreme.  He does not have a history of suicidal behavior and his aggression is usually transient and situational related to frustration rather than being any sort of plan doubt hostility.  Patient has been on multiple medications but has done well on clozapine and mood stabilizers.  Past Medical History:  Past Medical History:  Diagnosis Date  . Hypertension   . Intellectual disability   . Obsessive-compulsive disorder   . Schizo-affective schizophrenia [facility])     Past Surgical  History:  Procedure Laterality Date  . COLONOSCOPY WITH PROPOFOL N/A 12/28/2017   Procedure: COLONOSCOPY WITH PROPOFOL;  Surgeon: Jonathon Bellows, MD;  Location: [facility] ENDOSCOPY;  Service: Gastroenterology;  Laterality: N/A;  . HYDROCELE EXCISION Bilateral 02/22/2018   Procedure: bilateral HYDROCELECTOMY ADULT;  Surgeon: Cleon Gustin, MD;  Location: [place];  Service: Urology;  Laterality: Bilateral;  . INSERTION OF ILIAC STENT Left 02/22/2018   Procedure: INSERTION LEFT SUPERIOR FEMORAL ARTERY USING 6MM X 5CM VIABHON STENT with mynx device closure on right femoral artery;  Surgeon: Waynetta Sandy, MD;  Location: [place];  Service: Vascular;  Laterality: Left;  . KNEE ARTHROSCOPY    . SCROTAL EXPLORATION N/A 02/22/2018   Procedure: SCROTUM EXPLORATION;  Surgeon: Cleon Gustin, MD;  Location: [place];  Service: Urology;  Laterality: N/A;  . UPPER EXTREMITY ANGIOGRAM Left 02/22/2018   Procedure: left lower EXTREMITY ANGIOGRAM;  Surgeon: Waynetta Sandy, MD;  Location: [facility] OR;  Service: Vascular;  Laterality: Left;   Family History:  Family History  Problem Relation Age of Onset  . Diabetes Mother    Family Psychiatric  History: None reported Social History:  Social History   Substance and Sexual Activity  Alcohol Use No     Social History   Substance and Sexual Activity  Drug Use No    Social History   Socioeconomic History  . Marital status: Single    Spouse name: Not on file  . Number of children: Not on file  . Years of education: Not on file  . Highest education level: Not on file  Occupational History  . Occupation: Disabled  Social Needs  . Financial resource strain:  Not on file  . Food insecurity    Worry: Not on file    Inability: Not on file  . Transportation needs    Medical: Not on file    Non-medical: Not on file  Tobacco Use  . Smoking status: Former Smoker    Types: Cigarettes  . Smokeless tobacco: Never Used  Substance and Sexual Activity   . Alcohol use: No  . Drug use: No  . Sexual activity: Never  Lifestyle  . Physical activity    Days per week: Not on file    Minutes per session: Not on file  . Stress: Not on file  Relationships  . Social Herbalist on phone: Not on file    Gets together: Not on file    Attends religious service: Not on file    Active member of club or organization: Not on file    Attends meetings of clubs or organizations: Not on file    Relationship status: Not on file  Other Topics Concern  . Not on file  Social History Narrative   ** Merged History [facility] Course: Patient initially was unsteady on his feet and had quite a bit of difficulty communicating.  He had several falls early in his hospital stay.  He has a history of falling as a result of medications as well as a history of intentionally "falling" as a behavior.  He did not suffer any injuries from any of his falls.  Medications were adjusted and within a couple weeks he stabilized well on his current doses of clonazepam Haldol and Depakote.  Low standing doses of Ativan were added which helped quite a bit with keeping him calm but did not result in any oversedation.  He was having trouble sleeping at night and so Ambien was added.  This has proved effective.  Although these medicines do have some risk of abuse given that the patient will not ever be able to manage his own medicines there should be little risk of him abusing them.  For the last few weeks the patient has been quite stable.  He is steady on his feet.  He has no physical complaints.  He is able to eat independently and take care of his most basic ADLs with only a little assistance.  He has not been hostile or aggressive or threatening.  His cognition is clearly limited and he will ask the same question multiple times a day but he does not show any signs of psychosis.  1 important fact of this hospitalization was that it was observed that he was having  trouble swallowing.  Initial evaluation suggested that this was because he was just eating too fast but further evaluation by speech therapy show that he actually was having some asymptomatic aspiration.  Patient was switched to a thickened liquids diet and has had no trouble or complaints about that.  He was reevaluated by speech and found to still be having the same problem.  There is a strong recommendation that he stay only on thickened liquids diet at this point to avoid the risks of microaspiration which will inevitably lead to pneumonia if allowed to persist.  Physical Findings: AIMS: Facial and Oral Movements Muscles of Facial Expression: None, normal Lips and Perioral Area: None, normal Jaw: None, normal Tongue: None, normal,Extremity Movements Upper (arms, wrists, hands, fingers): None, normal Lower (legs, knees, ankles, toes): None, normal, Trunk Movements Neck, shoulders, hips:  None, normal, Overall Severity Severity of abnormal movements (highest score from questions above): None, normal Incapacitation due to abnormal movements: None, normal Patient's awareness of abnormal movements (rate only patient's report): No Awareness, Dental Status Current problems with teeth and/or dentures?: No Does patient usually wear dentures?: No  CIWA:  CIWA-Ar Total: 2 COWS:  COWS Total Score: 4  Musculoskeletal: Strength & Muscle Tone: within normal limits Gait & Station: normal Patient leans: N/A  Psychiatric Specialty Exam: Physical Exam  Nursing note and vitals reviewed. Constitutional: He appears well-developed and well-nourished.  HENT:  Head: Normocephalic and atraumatic.  Eyes: Pupils are equal, round, and reactive to light. Conjunctivae are normal.  Neck: Normal range of motion.  Cardiovascular: Regular rhythm and normal heart sounds.  Respiratory: Effort normal. No respiratory distress.  GI: Soft.  Musculoskeletal: Normal range of motion.  Neurological: He is alert.  Skin:  Skin is warm and dry.  Psychiatric: His affect is blunt. His speech is tangential and slurred. He is not agitated and not aggressive. Thought content is not paranoid and not delusional. Cognition and memory are impaired. He expresses inappropriate judgment. He expresses no homicidal and no suicidal ideation.    Review of Systems  Constitutional: Negative.   HENT: Negative.   Eyes: Negative.   Respiratory: Negative.   Cardiovascular: Negative.   Gastrointestinal: Negative.   Musculoskeletal: Negative.   Skin: Negative.   Neurological: Negative.   Psychiatric/Behavioral: Negative.     Blood pressure 110/82, pulse (!) 123, temperature 97.8 F (36.6 C), temperature source Oral, resp. rate 17, height 5\' 11"  (1.803 m), weight 67.1 kg, SpO2 98 %.Body mass index is 20.64 kg/m.  General Appearance: Casual  Eye Contact:  Fair  Speech:  Garbled and Slurred  Volume:  Decreased  Mood:  Euthymic  Affect:  Constricted  Thought Process:  Disorganized  Orientation:  Negative  Thought Content:  Illogical, Rumination and Tangential  Suicidal Thoughts:  No  Homicidal Thoughts:  No  Memory:  Immediate;   Fair Recent;   Poor Remote;   Poor  Judgement:  Impaired  Insight:  Shallow  Psychomotor Activity:  Normal  Concentration:  Concentration: Poor  Recall:  Poor  Fund of Knowledge:  Poor  Language:  Poor  Akathisia:  No  Handed:  Right  AIMS (if indicated):     Assets:  Desire for Improvement Financial Resources/Insurance Social Support  ADL's:  Impaired  Cognition:  Impaired,  Moderate  Sleep:  Number of Hours: 6.25     Have you used any form of tobacco in the last 30 days? (Cigarettes, Smokeless Tobacco, Cigars, and/or Pipes): No  Has this patient used any form of tobacco in the last 30 days? (Cigarettes, Smokeless Tobacco, Cigars, and/or Pipes) Yes, No  Blood Alcohol level:  Lab Results  Component Value Date   ETH <10 08/31/2018   ETH <10 75/11/2583    Metabolic Disorder  Labs:  Lab Results  Component Value Date   HGBA1C 5.4 09/30/2016   MPG 108.28 09/30/2016   MPG 114 09/17/2016   No results found for: PROLACTIN Lab Results  Component Value Date   CHOL 135 09/30/2016   TRIG 104 02/25/2018   HDL 60 09/30/2016   CHOLHDL 2.3 09/30/2016   VLDL 12 09/30/2016   LDLCALC 63 09/30/2016    See Psychiatric Specialty Exam and Suicide Risk Assessment completed by Attending Physician prior to discharge.  Discharge destination:  Home  Is patient on multiple antipsychotic therapies at discharge:  Yes,  Do you recommend tapering to monotherapy for antipsychotics?  No   Has Patient had three or more failed trials of antipsychotic monotherapy by history:  Yes,   Antipsychotic medications that previously failed include:   1.  clozapine., 2.  haldol. and 3.  zyprexa.  Recommended Plan for Multiple Antipsychotic Therapies: Second antipsychotic is Clozapine.  Reason for adding Clozapine Failure to achieve psychiatric stability even at full dose clozapine  Discharge Instructions    Diet - low sodium heart healthy   Complete by: As directed    Increase activity slowly   Complete by: As directed      Allergies as of 10/04/2018   No Known Allergies     Medication List    STOP taking these medications   acetaminophen 500 MG tablet Commonly known as: TYLENOL   benztropine 1 MG tablet Commonly known as: COGENTIN   diphenhydrAMINE 50 MG capsule Commonly known as: BENADRYL   Resource ThickenUp Clear Powd   traZODone 100 MG tablet Commonly known as: DESYREL     TAKE these medications     Indication  atropine 1 % ophthalmic solution Place 2 drops under the tongue 4 (four) times daily.  Indication: sialorrhea   clopidogrel 75 MG tablet Commonly known as: PLAVIX Take 1 tablet (75 mg total) by mouth daily.  Indication: Ischemic Heart Disease   cloZAPine 100 MG tablet Commonly known as: CLOZARIL Take 3 tablets (300 mg total) by mouth at  bedtime. What changed:   how much to take  when to take this  additional instructions  Another medication with the same name was removed. Continue taking this medication, and follow the directions you see here.  Indication: Schizophrenia   divalproex 250 MG DR tablet Commonly known as: DEPAKOTE Take 3 tablets (750 mg total) by mouth 2 (two) times daily. What changed:   medication strength  how much to take  Indication: Schizophrenia, agitation   gabapentin 300 MG capsule Commonly known as: NEURONTIN Take 1 capsule (300 mg total) by mouth 3 (three) times daily.  Indication: Fibromyalgia Syndrome   haloperidol 5 MG tablet Commonly known as: HALDOL Take 1 tablet (5 mg total) by mouth every 6 (six) hours as needed for agitation. What changed:   when to take this  reasons to take this  Indication: Problems with Behavior   LORazepam 1 MG tablet Commonly known as: ATIVAN Take 1 tablet (1 mg total) by mouth 2 (two) times daily. What changed:   when to take this  reasons to take this  Indication: Schizophrenia   multivitamin with minerals Tabs tablet Take 1 tablet by mouth daily.  Indication: vitamin deficincy   pantoprazole 40 MG tablet Commonly known as: PROTONIX Take 1 tablet (40 mg total) by mouth 2 (two) times daily.  Indication: Gastroesophageal Reflux Disease   zolpidem 5 MG tablet Commonly known as: AMBIEN Take 1 tablet (5 mg total) by mouth at bedtime.  Indication: [place] Follow up on 10/12/2018.   Why: You have an appointment scheduled with Dr. Loni Muse on 10/12/18 at 11:30AM. Guardian needs to be present at appointment and bring photo id [both patient and guardian]. Also bring insurance card, discharge summary and wear masks. Thank You! Contact information: [address] [place] [address] [phone]           Follow-up recommendations:  Activity:  Activity as  tolerated Diet:  Patient must be on  a thickened liquids diet Other:  Follow-up with outpatient care in [place].  Prescriptions have all been electronically prescribed to the local pharmacy in [place].  Paper prescriptions are also provided.  10-day supply also supplied  Comments: Discharged with medications and prescriptions.  Guardian aware and involved in whole plan for this new placement.  Signed: Alethia Berthold, MD 10/04/2018, 9:10 AM

## 2018-10-04 NOTE — Progress Notes (Signed)
Patient denies SI/HI, denies A/V hallucinations. Patient verbalizes understanding of discharge instructions, follow up care and prescriptions.7 days medications given. Patient given all belongings from [facility] locker. Patient escorted out by staff, [facility].

## 2018-10-04 NOTE — Progress Notes (Signed)
Patient alert and oriented x 4, affect is blunted he stares at writer, intrusive and attention seeking. Patient's thoughts are organized and coherent, speech is slurred and tangential, he denies SI/HI/AVH.patient excited he is leaving tomorrow. 15 minutes safety checks maintained will continue to monitor

## 2018-10-04 NOTE — BH Assessment (Signed)
Writer received phone call from [facility] [phone]) asking if patient was still in the facility. Patient remains on their wait list.

## 2018-10-04 NOTE — Plan of Care (Signed)
  Problem: Group Participation Goal: STG - Patient will engage in groups without prompting or encouragement from LRT x3 group sessions within 5 recreation therapy group sessions Description: STG - Patient will engage in groups without prompting or encouragement from LRT x3 group sessions within 5 recreation therapy group sessions Outcome: Completed/Met

## 2018-10-04 NOTE — Plan of Care (Signed)
  Problem: Role Relationship: Goal: Will demonstrate positive changes in social behaviors and relationships Outcome: Progressing  Patient demonstrated positive changes in social activities; Interacting with peers appropriately.

## 2018-10-16 DIAGNOSIS — K219 Gastro-esophageal reflux disease without esophagitis: Secondary | ICD-10-CM | POA: Insufficient documentation

## 2018-10-26 DIAGNOSIS — J45909 Unspecified asthma, uncomplicated: Secondary | ICD-10-CM | POA: Diagnosis present

## 2018-10-26 DIAGNOSIS — Z8659 Personal history of other mental and behavioral disorders: Secondary | ICD-10-CM | POA: Insufficient documentation

## 2018-12-07 ENCOUNTER — Other Ambulatory Visit: Payer: Self-pay | Admitting: Psychiatry

## 2018-12-11 ENCOUNTER — Other Ambulatory Visit: Payer: Self-pay | Admitting: Psychiatry

## 2019-01-07 ENCOUNTER — Other Ambulatory Visit: Payer: Self-pay | Admitting: Psychiatry

## 2019-04-03 DIAGNOSIS — K5901 Slow transit constipation: Secondary | ICD-10-CM | POA: Diagnosis present

## 2019-04-18 ENCOUNTER — Other Ambulatory Visit: Payer: Medicaid Other | Admitting: Hospice

## 2019-04-18 ENCOUNTER — Other Ambulatory Visit: Payer: Self-pay

## 2019-04-18 DIAGNOSIS — Z515 Encounter for palliative care: Secondary | ICD-10-CM

## 2019-04-18 DIAGNOSIS — F250 Schizoaffective disorder, bipolar type: Secondary | ICD-10-CM

## 2019-04-18 NOTE — Progress Notes (Signed)
[facility] Consult Note Telephone: [phone]  Fax: [phone]  PATIENT NAME: Austin Gates DOB: 15-Dec-1960 MRN: [id number]  PRIMARY CARE PROVIDER:   Bonnita Nasuti, MD  REFERRING PROVIDER:  Bonnita Nasuti, MD [address] [place],  [place] [address]  RESPONSIBLE PARTY:   [name], sister and legal guardian, home: [phone]  Cell: [place]- sister  [phone]  TELEHEALTH VISIT STATEMENT Due to the COVID-19 crisis, this visit was done via telephone from my office. It was initiated and consented to by this patient and/or family.  RECOMMENDATIONS/PLAN:   Advance Care Planning/Goals of Care: Telehealth Visit consisted of building trust and discussions on Palliative Medicine as specialized medical care for people living with serious illness, aimed at facilitating better quality of life through symptoms relief, assisting with advance care plan and establishing goals of care. Austin Gates expressed joy at palliative reaching out to her after a long time since May '20. She affirmed patient has moved from a group home to an assisted living home and now back to a group [facility]. NP called Group home at [phone] and left a voicemail with call back number. Er Austin Gates, Patient remains a full code. Goals of care include to maximize quality of life and symptom management. She is open to advance care planning, to reconsider patient's code status in the future, when appropriate.  Symptom management: Cognitive deficits/intellectual disability is ongoing. Patient is closely followed by a Psychiatrist and behavior is fairly under control at this time. Per Austin Gates, patient had pneumonia last month and was resolved with treatment. She talks to patient at least twice every week. Follow up: Palliative care will continue to follow patient for goals of care clarification and symptom  management.  I spent 46  minutes providing this consultation; time includes chart review and documentation. More than 50% of the time in this consultation was spent on coordinating communication  HISTORY OF PRESENT ILLNESS:  Austin Gates is a 59 y.o. year old male with multiple medical problems including intellectual disability, schizoaffective disorder, HTN. Palliative Care was asked to help address goals of care.  CODE STATUS: Full   PPS: 60% HOSPICE ELIGIBILITY/DIAGNOSIS: TBD  PAST MEDICAL HISTORY:  Past Medical History:  Diagnosis Date  . Hypertension   . Intellectual disability   . Obsessive-compulsive disorder   . Schizo-affective schizophrenia ([place])     SOCIAL HX:  Social History   Tobacco Use  . Smoking status: Former Smoker    Types: Cigarettes  . Smokeless tobacco: Never Used  Substance Use Topics  . Alcohol use: No    ALLERGIES: No Known Allergies   PERTINENT MEDICATIONS:  Outpatient Encounter Medications as of 04/18/2019  Medication Sig  . atropine 1 % ophthalmic solution Place 2 drops under the tongue 4 (four) times daily.  . clopidogrel (PLAVIX) 75 MG tablet Take 1 tablet (75 mg total) by mouth daily.  . cloZAPine (CLOZARIL) 100 MG tablet Take 3 tablets (300 mg total) by mouth at bedtime.  . divalproex (DEPAKOTE) 250 MG DR tablet Take 3 tablets (750 mg total) by mouth 2 (two) times daily.  Marland Kitchen gabapentin (NEURONTIN) 300 MG capsule Take 1 capsule (300 mg total) by mouth 3 (three) times daily.  . haloperidol (HALDOL) 5 MG tablet Take 1 tablet (5 mg total) by mouth every 6 (six) hours as needed for agitation.  Marland Kitchen LORazepam (ATIVAN) 1 MG tablet Take 1 tablet (1 mg  total) by mouth 2 (two) times daily.  . Multiple Vitamin (MULTIVITAMIN WITH MINERALS) TABS tablet Take 1 tablet by mouth daily.  . pantoprazole (PROTONIX) 40 MG tablet Take 1 tablet (40 mg total) by mouth 2 (two) times daily.  Marland Kitchen zolpidem (AMBIEN) 5 MG tablet Take 1 tablet (5 mg total) by mouth at bedtime.    No facility-administered encounter medications on file as of 04/18/2019.    Teodoro Spray, NP

## 2019-09-19 DIAGNOSIS — R000 Tachycardia, unspecified: Secondary | ICD-10-CM | POA: Insufficient documentation

## 2020-04-29 ENCOUNTER — Other Ambulatory Visit: Payer: Self-pay

## 2020-04-29 ENCOUNTER — Emergency Department
Admission: EM | Admit: 2020-04-29 | Discharge: 2020-04-29 | Disposition: A | Discharge status: Home / Self Care | Payer: Medicaid Other | Attending: Emergency Medicine | Admitting: Emergency Medicine

## 2020-04-29 DIAGNOSIS — Z7902 Long term (current) use of antithrombotics/antiplatelets: Secondary | ICD-10-CM | POA: Diagnosis not present

## 2020-04-29 DIAGNOSIS — Z87891 Personal history of nicotine dependence: Secondary | ICD-10-CM | POA: Insufficient documentation

## 2020-04-29 DIAGNOSIS — F250 Schizoaffective disorder, bipolar type: Secondary | ICD-10-CM | POA: Diagnosis present

## 2020-04-29 DIAGNOSIS — F429 Obsessive-compulsive disorder, unspecified: Secondary | ICD-10-CM | POA: Diagnosis not present

## 2020-04-29 DIAGNOSIS — Z20822 Contact with and (suspected) exposure to covid-19: Secondary | ICD-10-CM | POA: Diagnosis not present

## 2020-04-29 DIAGNOSIS — Z7189 Other specified counseling: Secondary | ICD-10-CM | POA: Insufficient documentation

## 2020-04-29 DIAGNOSIS — F71 Moderate intellectual disabilities: Secondary | ICD-10-CM | POA: Diagnosis not present

## 2020-04-29 DIAGNOSIS — F17200 Nicotine dependence, unspecified, uncomplicated: Secondary | ICD-10-CM | POA: Diagnosis present

## 2020-04-29 DIAGNOSIS — Z79899 Other long term (current) drug therapy: Secondary | ICD-10-CM | POA: Insufficient documentation

## 2020-04-29 DIAGNOSIS — Z046 Encounter for general psychiatric examination, requested by authority: Secondary | ICD-10-CM | POA: Diagnosis present

## 2020-04-29 DIAGNOSIS — I10 Essential (primary) hypertension: Secondary | ICD-10-CM | POA: Insufficient documentation

## 2020-04-29 LAB — COMPREHENSIVE METABOLIC PANEL
ALT: 14 U/L (ref 0–44)
AST: 19 U/L (ref 15–41)
Albumin: 3.5 g/dL (ref 3.5–5.0)
Alkaline Phosphatase: 60 U/L (ref 38–126)
Anion gap: 8 (ref 5–15)
BUN: 34 mg/dL — ABNORMAL HIGH (ref 6–20)
CO2: 24 mmol/L (ref 22–32)
Calcium: 8.8 mg/dL — ABNORMAL LOW (ref 8.9–10.3)
Chloride: 107 mmol/L (ref 98–111)
Creatinine, Ser: 0.83 mg/dL (ref 0.61–1.24)
GFR, Estimated: >60 mL/min (ref >60)
Glucose, Bld: 111 mg/dL — ABNORMAL HIGH (ref 70–99)
Potassium: 4.3 mmol/L (ref 3.5–5.1)
Sodium: 139 mmol/L (ref 135–145)
Total Bilirubin: 0.4 mg/dL (ref 0.3–1.2)
Total Protein: 7.4 g/dL (ref 6.5–8.1)

## 2020-04-29 LAB — CBC
HCT: 33.8 % — ABNORMAL LOW (ref 39.0–52.0)
Hemoglobin: 10.7 g/dL — ABNORMAL LOW (ref 13.0–17.0)
MCH: 29.5 pg (ref 26.0–34.0)
MCHC: 31.7 g/dL (ref 30.0–36.0)
MCV: 93.1 fL (ref 80.0–100.0)
Platelets: 204 10*3/uL (ref 150–400)
RBC: 3.63 MIL/uL — ABNORMAL LOW (ref 4.22–5.81)
RDW: 14.4 % (ref 11.5–15.5)
WBC: 3.8 10*3/uL — ABNORMAL LOW (ref 4.0–10.5)
nRBC: 0 % (ref 0.0–0.2)

## 2020-04-29 LAB — RESP PANEL BY RT-PCR (FLU A&B, COVID) ARPGX2
Influenza A by PCR: NEGATIVE
Influenza B by PCR: NEGATIVE
SARS Coronavirus 2 by RT PCR: NEGATIVE

## 2020-04-29 LAB — ETHANOL: Alcohol, Ethyl (B): <10 mg/dL (ref <10)

## 2020-04-29 LAB — SALICYLATE LEVEL: Salicylate Lvl: <7 mg/dL — ABNORMAL LOW (ref 7.0–30.0)

## 2020-04-29 LAB — ACETAMINOPHEN LEVEL: Acetaminophen (Tylenol), Serum: <10 ug/mL — ABNORMAL LOW (ref 10–30)

## 2020-04-29 MED ORDER — LORAZEPAM 1 MG PO TABS
1.0000 mg | ORAL_TABLET | Freq: Once | ORAL | Status: AC
  Administered 2020-04-29: 1 mg via ORAL
  Filled 2020-04-29: qty 1

## 2020-04-29 NOTE — Consult Note (Signed)
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 male patient admitted with "I have been in jail"  HPI: Patient seen chart reviewed.  Patient known from previous encounters.  60 year old man with intellectual disability and schizoaffective disorder.  Just got out of jail today.  Guardian picked him up and he is already showing behavior problems.  Trying to get out of the car being disruptive.  On interview today the patient has no complaints except wanting a [place] cigarette.  Denies suicidal or homicidal thoughts.  Denies having any hallucinations.  Patient says he was taking medicine while he was in jail but does not know what it was.  Currently he is calm and behaving himself okay in the emergency room  Past Psychiatric History: Patient has a long history of chronic mental health problems and behavior problems.  Intellectual disability with an IQ below 81.  Chronic behavior problems.  Has done adequately when he was on high doses of medication in the past but has also suffered multiple visits to jails as a result of his behaviors.  Risk to Self:   Risk to Others:   Prior Inpatient Therapy:   Prior Outpatient Therapy:    Past Medical History:  Past Medical History:  Diagnosis Date  . Hypertension   . Intellectual disability   . Obsessive-compulsive disorder   . Schizo-affective schizophrenia [facility])     Past Surgical History:  Procedure Laterality Date  .  COLONOSCOPY WITH PROPOFOL N/A 12/28/2017   Procedure: COLONOSCOPY WITH PROPOFOL;  Surgeon: Jonathon Bellows, MD;  Location: [facility] ENDOSCOPY;  Service: Gastroenterology;  Laterality: N/A;  . HYDROCELE EXCISION Bilateral 02/22/2018   Procedure: bilateral HYDROCELECTOMY ADULT;  Surgeon: Cleon Gustin, MD;  Location: [place];  Service: Urology;  Laterality: Bilateral;  . INSERTION OF ILIAC STENT Left 02/22/2018   Procedure: INSERTION LEFT SUPERIOR FEMORAL ARTERY USING 6MM X 5CM VIABHON STENT with mynx device closure on right femoral artery;  Surgeon: Waynetta Sandy, MD;  Location: [place];  Service: Vascular;  Laterality: Left;  . KNEE ARTHROSCOPY    . SCROTAL EXPLORATION N/A 02/22/2018   Procedure: SCROTUM EXPLORATION;  Surgeon: Cleon Gustin, MD;  Location: [place];  Service: Urology;  Laterality: N/A;  . UPPER EXTREMITY ANGIOGRAM Left 02/22/2018   Procedure: left lower EXTREMITY ANGIOGRAM;  Surgeon: Waynetta Sandy, MD;  [facility] OR;  Service: Vascular;  Laterality: Left;   Family History:  Family History  Problem Relation Age of Onset  . Diabetes Mother    Family Psychiatric  History: None reported Social History:  Social History   Substance and Sexual Activity  Alcohol Use No     Social History   Substance and Sexual Activity  Drug Use No    Social History   Socioeconomic History  . Marital status: Single    Spouse name: Not on file  . Number of children: Not on file  . Years of education: Not on file  . Highest education level: Not on file  Occupational History  . Occupation:  Disabled  Tobacco Use  . Smoking status: Former Smoker    Types: Cigarettes  . Smokeless tobacco: Never Used  Vaping Use  . Vaping Use: Unknown  Substance and Sexual Activity  . Alcohol use: No  . Drug use: No  . Sexual activity: Never  Other Topics Concern  . Not on file  Social History Narrative   ** Merged History Encounter **       Social Determinants of Health    Financial Resource Strain: Not on file  Food Insecurity: Not on file  Transportation Needs: Not on file  Physical Activity: Not on file  Stress: Not on file  Social Connections: Not on file   Additional Social History:    Allergies:  No Known Allergies  Labs:  Results for orders placed or performed during the hospital encounter of 04/29/20 (from the past 48 hour(s))  Comprehensive metabolic panel     Status: Abnormal   Collection Time: 04/29/20  5:11 PM  Result Value Ref Range   Sodium 139 135 - 145 mmol/L   Potassium 4.3 3.5 - 5.1 mmol/L   Chloride 107 98 - 111 mmol/L   CO2 24 22 - 32 mmol/L   Glucose, Bld 111 (H) 70 - 99 mg/dL    Comment: Glucose reference range applies only to samples taken after fasting for at least 8 hours.   BUN 34 (H) 6 - 20 mg/dL   Creatinine, Ser 0.83 0.61 - 1.24 mg/dL   Calcium 8.8 (L) 8.9 - 10.3 mg/dL   Total Protein 7.4 6.5 - 8.1 g/dL   Albumin 3.5 3.5 - 5.0 g/dL   AST 19 15 - 41 U/L   ALT 14 0 - 44 U/L   Alkaline Phosphatase 60 38 - 126 U/L   Total Bilirubin 0.4 0.3 - 1.2 mg/dL   GFR, Estimated >60 >60 mL/min    Comment: (NOTE) Calculated using the CKD-EPI Creatinine Equation (2021)    Anion gap 8 5 - 15    Comment: Performed at [facility], [address] [place], [place] [address]  Ethanol     Status: None   Collection Time: 04/29/20  5:11 PM  Result Value Ref Range   Alcohol, Ethyl (B) <10 <10 mg/dL    Comment: (NOTE) Lowest detectable limit for serum alcohol is 10 mg/dL.  For medical purposes only. Performed at [facility], [address] [place], [place] [address]   Salicylate level     Status: Abnormal   Collection Time: 04/29/20  5:11 PM  Result Value Ref Range   Salicylate Lvl <1.0 (L) 7.0 - 30.0 mg/dL    Comment: Performed at [facility], [place], [place] [address]  Acetaminophen level     Status: Abnormal   Collection Time: 04/29/20  5:11 PM  Result Value Ref Range    Acetaminophen (Tylenol), Serum <10 (L) 10 - 30 ug/mL    Comment: (NOTE) Therapeutic concentrations vary significantly. A range of 10-30 ug/mL  may be an effective concentration for many patients. However, some  are best treated at concentrations outside of this range. Acetaminophen concentrations >150 ug/mL at 4 hours after ingestion  and >50 ug/mL at 12 hours after ingestion are often associated with  toxic reactions.  Performed at [facility], [address] [place], [place] [address]   cbc     Status: Abnormal   Collection Time: 04/29/20  5:11 PM  Result Value Ref Range   WBC 3.8 (L) 4.0 -  10.5 K/uL   RBC 3.63 (L) 4.22 - 5.81 MIL/uL   Hemoglobin 10.7 (L) 13.0 - 17.0 g/dL   HCT 33.8 (L) 39.0 - 52.0 %   MCV 93.1 80.0 - 100.0 fL   MCH 29.5 26.0 - 34.0 pg   MCHC 31.7 30.0 - 36.0 g/dL   RDW 14.4 11.5 - 15.5 %   Platelets 204 150 - 400 K/uL   nRBC 0.0 0.0 - 0.2 %    Comment: Performed at [facility], [address] [place], [place] [address]    No current facility-administered medications for this encounter.   Current Outpatient Medications  Medication Sig Dispense Refill  . atropine 1 % ophthalmic solution Place 2 drops under the tongue 4 (four) times daily. 2 mL 2  . clopidogrel (PLAVIX) 75 MG tablet Take 1 tablet (75 mg total) by mouth daily. 30 tablet 2  . cloZAPine (CLOZARIL) 100 MG tablet Take 3 tablets (300 mg total) by mouth at bedtime. 90 tablet 2  . divalproex (DEPAKOTE) 250 MG DR tablet Take 3 tablets (750 mg total) by mouth 2 (two) times daily. 180 tablet 2  . gabapentin (NEURONTIN) 300 MG capsule Take 1 capsule (300 mg total) by mouth 3 (three) times daily. 90 capsule 2  . haloperidol (HALDOL) 5 MG tablet Take 1 tablet (5 mg total) by mouth every 6 (six) hours as needed for agitation. 60 tablet 2  . LORazepam (ATIVAN) 1 MG tablet Take 1 tablet (1 mg total) by mouth 2 (two) times daily. 60 tablet 2  . Multiple Vitamin (MULTIVITAMIN WITH  MINERALS) TABS tablet Take 1 tablet by mouth daily. 30 tablet 2  . pantoprazole (PROTONIX) 40 MG tablet Take 1 tablet (40 mg total) by mouth 2 (two) times daily. 60 tablet 2  . zolpidem (AMBIEN) 5 MG tablet Take 1 tablet (5 mg total) by mouth at bedtime. 30 tablet 2    Musculoskeletal: Strength & Muscle Tone: within normal limits Gait & Station: normal Patient leans: N/A            Psychiatric Specialty Exam:  Presentation  General Appearance: No data recorded Eye Contact:No data recorded Speech:No data recorded Speech Volume:No data recorded Handedness:No data recorded  Mood and Affect  Mood:No data recorded Affect:No data recorded  Thought Process  Thought Processes:No data recorded Descriptions of Associations:No data recorded Orientation:No data recorded Thought Content:No data recorded History of Schizophrenia/Schizoaffective disorder:No data recorded Duration of Psychotic Symptoms:No data recorded Hallucinations:No data recorded Ideas of Reference:No data recorded Suicidal Thoughts:No data recorded Homicidal Thoughts:No data recorded  Sensorium  Memory:No data recorded Judgment:No data recorded Insight:No data recorded  Executive Functions  Concentration:No data recorded Attention Span:No data recorded Recall:No data recorded Fund of Knowledge:No data recorded Language:No data recorded  Psychomotor Activity  Psychomotor Activity:No data recorded  Assets  Assets:No data recorded  Sleep  Sleep:No data recorded  Physical Exam: Physical Exam Vitals and nursing note reviewed.  Constitutional:      Appearance: Normal appearance.  HENT:     Head: Normocephalic and atraumatic.     Mouth/Throat:     Pharynx: Oropharynx is clear.  Eyes:     Pupils: Pupils are equal, round, and reactive to light.  Cardiovascular:     Rate and Rhythm: Normal rate and regular rhythm.  Pulmonary:     Effort: Pulmonary effort is normal.     Breath sounds: Normal  breath sounds.  Abdominal:     General: Abdomen is flat.  Palpations: Abdomen is soft.  Musculoskeletal:        General: Normal range of motion.  Skin:    General: Skin is warm and dry.  Neurological:     General: No focal deficit present.     Mental Status: He is alert. Mental status is at baseline.  Psychiatric:        Mood and Affect: Mood normal.    Review of Systems  Constitutional: Negative.   HENT: Negative.   Eyes: Negative.   Respiratory: Negative.   Cardiovascular: Negative.   Gastrointestinal: Negative.   Musculoskeletal: Negative.   Skin: Negative.   Neurological: Negative.   Psychiatric/Behavioral: Negative.    Blood pressure 109/73, pulse 69, temperature 97.9 F (36.6 C), temperature source Oral, resp. rate 18, height 5\' 11"  (1.803 m), weight 70 kg, SpO2 100 %. Body mass index is 21.52 kg/m.  Treatment Plan Summary: Plan Patient seen chart reviewed.  Patient appears to be at his baseline.  Not agitated not violent not threatening not acting out in a suicidal manner.  Not showing racing thoughts or agitation.  Unfortunately his baseline is still difficult to manage outside of a structured setting.  However he does not meet commitment criteria and does not require inpatient hospitalization.  Case reviewed with emergency room physician.  Spoke with the guardian.  We will not be able to admit him to our hospital at this time.  Recommend he continue on his outpatient medication provided by the jail and then follow-up with outpatient psychiatric treatment as usual.  Disposition: No evidence of imminent risk to self or others at present.   Patient does not meet criteria for psychiatric inpatient admission. Supportive therapy provided about ongoing stressors.  Alethia Berthold, MD 04/29/2020 6:19 PM

## 2020-04-29 NOTE — ED Notes (Signed)
Guardian given discharge information. Signature obtained from guardian. Clothing returned to pt.

## 2020-04-29 NOTE — ED Notes (Signed)
Dinner tray served

## 2020-04-29 NOTE — ED Notes (Signed)
Vol consult complete
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
## 2020-04-29 NOTE — ED Notes (Addendum)
Pt belongings include two red shoes, two socks, one pair pants, one tshirt, one pair underwear. BELONGINGS GIVEN TO LEGAL GUARDIAN.

## 2020-04-29 NOTE — ED Triage Notes (Signed)
Pt comes pov with legal guardian at bedside for psych eval. Pt very hard to understand but legal guardian states that he was just released from jail a few hours ago and they were unsure if they gave him his psych medications while he was there. Guardian states that "he is a danger to himself" but cannot give explicit reasons why. Used to live in a group home but doesn't have one right now and legal guardian/caregiver said if he didn't go to the hospital that he would go home with her. Guardian also states that she feels his balance has been off.

## 2020-04-30 ENCOUNTER — Other Ambulatory Visit: Payer: Self-pay

## 2020-04-30 ENCOUNTER — Inpatient Hospital Stay
Admission: EM | Admit: 2020-04-30 | Discharge: 2020-05-07 | DRG: 189 | Disposition: A | Discharge status: Other Acute Inpatient Hospital | Payer: Medicaid Other | Attending: Internal Medicine | Admitting: Internal Medicine

## 2020-04-30 DIAGNOSIS — Z20822 Contact with and (suspected) exposure to covid-19: Secondary | ICD-10-CM | POA: Diagnosis present

## 2020-04-30 DIAGNOSIS — T68XXXA Hypothermia, initial encounter: Secondary | ICD-10-CM | POA: Diagnosis present

## 2020-04-30 DIAGNOSIS — F429 Obsessive-compulsive disorder, unspecified: Secondary | ICD-10-CM | POA: Diagnosis present

## 2020-04-30 DIAGNOSIS — Z833 Family history of diabetes mellitus: Secondary | ICD-10-CM

## 2020-04-30 DIAGNOSIS — I959 Hypotension, unspecified: Secondary | ICD-10-CM | POA: Diagnosis present

## 2020-04-30 DIAGNOSIS — D61811 Other drug-induced pancytopenia: Secondary | ICD-10-CM | POA: Diagnosis present

## 2020-04-30 DIAGNOSIS — R000 Tachycardia, unspecified: Secondary | ICD-10-CM | POA: Diagnosis present

## 2020-04-30 DIAGNOSIS — F250 Schizoaffective disorder, bipolar type: Secondary | ICD-10-CM | POA: Diagnosis not present

## 2020-04-30 DIAGNOSIS — F71 Moderate intellectual disabilities: Secondary | ICD-10-CM | POA: Diagnosis present

## 2020-04-30 DIAGNOSIS — R4182 Altered mental status, unspecified: Secondary | ICD-10-CM

## 2020-04-30 DIAGNOSIS — Z793 Long term (current) use of hormonal contraceptives: Secondary | ICD-10-CM

## 2020-04-30 DIAGNOSIS — D509 Iron deficiency anemia, unspecified: Secondary | ICD-10-CM | POA: Diagnosis present

## 2020-04-30 DIAGNOSIS — Z79899 Other long term (current) drug therapy: Secondary | ICD-10-CM

## 2020-04-30 DIAGNOSIS — R6510 Systemic inflammatory response syndrome (SIRS) of non-infectious origin without acute organ dysfunction: Secondary | ICD-10-CM | POA: Diagnosis present

## 2020-04-30 DIAGNOSIS — R4689 Other symptoms and signs involving appearance and behavior: Secondary | ICD-10-CM | POA: Diagnosis present

## 2020-04-30 DIAGNOSIS — I951 Orthostatic hypotension: Secondary | ICD-10-CM | POA: Diagnosis present

## 2020-04-30 DIAGNOSIS — Z87891 Personal history of nicotine dependence: Secondary | ICD-10-CM

## 2020-04-30 DIAGNOSIS — T424X5A Adverse effect of benzodiazepines, initial encounter: Secondary | ICD-10-CM | POA: Diagnosis present

## 2020-04-30 DIAGNOSIS — J9601 Acute respiratory failure with hypoxia: Principal | ICD-10-CM | POA: Diagnosis present

## 2020-04-30 DIAGNOSIS — R680 Hypothermia, not associated with low environmental temperature: Secondary | ICD-10-CM | POA: Diagnosis present

## 2020-04-30 DIAGNOSIS — D61818 Other pancytopenia: Secondary | ICD-10-CM | POA: Diagnosis present

## 2020-04-30 DIAGNOSIS — D72819 Decreased white blood cell count, unspecified: Secondary | ICD-10-CM

## 2020-04-30 DIAGNOSIS — G9341 Metabolic encephalopathy: Secondary | ICD-10-CM | POA: Diagnosis present

## 2020-04-30 DIAGNOSIS — I10 Essential (primary) hypertension: Secondary | ICD-10-CM | POA: Diagnosis present

## 2020-04-30 LAB — URINE DRUG SCREEN, QUALITATIVE (ARMC ONLY)
Amphetamines, Ur Screen: NOT DETECTED
Barbiturates, Ur Screen: NOT DETECTED
Benzodiazepine, Ur Scrn: NOT DETECTED
Cannabinoid 50 Ng, Ur ~~LOC~~: NOT DETECTED
Cocaine Metabolite,Ur ~~LOC~~: NOT DETECTED
MDMA (Ecstasy)Ur Screen: NOT DETECTED
Methadone Scn, Ur: NOT DETECTED
Opiate, Ur Screen: NOT DETECTED
Phencyclidine (PCP) Ur S: NOT DETECTED
Tricyclic, Ur Screen: NOT DETECTED

## 2020-04-30 LAB — COMPREHENSIVE METABOLIC PANEL
ALT: 14 U/L (ref 0–44)
AST: 18 U/L (ref 15–41)
Albumin: 3.6 g/dL (ref 3.5–5.0)
Alkaline Phosphatase: 57 U/L (ref 38–126)
Anion gap: 5 (ref 5–15)
BUN: 22 mg/dL — ABNORMAL HIGH (ref 6–20)
CO2: 29 mmol/L (ref 22–32)
Calcium: 9.1 mg/dL (ref 8.9–10.3)
Chloride: 105 mmol/L (ref 98–111)
Creatinine, Ser: 0.85 mg/dL (ref 0.61–1.24)
GFR, Estimated: >60 mL/min (ref >60)
Glucose, Bld: 75 mg/dL (ref 70–99)
Potassium: 4.2 mmol/L (ref 3.5–5.1)
Sodium: 139 mmol/L (ref 135–145)
Total Bilirubin: 0.4 mg/dL (ref 0.3–1.2)
Total Protein: 7.4 g/dL (ref 6.5–8.1)

## 2020-04-30 LAB — CBC
HCT: 35.1 % — ABNORMAL LOW (ref 39.0–52.0)
Hemoglobin: 11.2 g/dL — ABNORMAL LOW (ref 13.0–17.0)
MCH: 29.2 pg (ref 26.0–34.0)
MCHC: 31.9 g/dL (ref 30.0–36.0)
MCV: 91.6 fL (ref 80.0–100.0)
Platelets: 194 10*3/uL (ref 150–400)
RBC: 3.83 MIL/uL — ABNORMAL LOW (ref 4.22–5.81)
RDW: 14.4 % (ref 11.5–15.5)
WBC: 3.9 10*3/uL — ABNORMAL LOW (ref 4.0–10.5)
nRBC: 0 % (ref 0.0–0.2)

## 2020-04-30 LAB — CBC WITH DIFFERENTIAL/PLATELET
Abs Immature Granulocytes: 0 10*3/uL (ref 0.00–0.07)
Basophils Absolute: 0 10*3/uL (ref 0.0–0.1)
Basophils Relative: 1 %
Eosinophils Absolute: 0 10*3/uL (ref 0.0–0.5)
Eosinophils Relative: 1 %
HCT: 35.5 % — ABNORMAL LOW (ref 39.0–52.0)
Hemoglobin: 10.9 g/dL — ABNORMAL LOW (ref 13.0–17.0)
Immature Granulocytes: 0 %
Lymphocytes Relative: 25 %
Lymphs Abs: 1 10*3/uL (ref 0.7–4.0)
MCH: 28.7 pg (ref 26.0–34.0)
MCHC: 30.7 g/dL (ref 30.0–36.0)
MCV: 93.4 fL (ref 80.0–100.0)
Monocytes Absolute: 0.5 10*3/uL (ref 0.1–1.0)
Monocytes Relative: 12 %
Neutro Abs: 2.4 10*3/uL (ref 1.7–7.7)
Neutrophils Relative %: 61 %
Platelets: 219 10*3/uL (ref 150–400)
RBC: 3.8 MIL/uL — ABNORMAL LOW (ref 4.22–5.81)
RDW: 14.5 % (ref 11.5–15.5)
WBC: 3.9 10*3/uL — ABNORMAL LOW (ref 4.0–10.5)
nRBC: 0 % (ref 0.0–0.2)

## 2020-04-30 LAB — RESP PANEL BY RT-PCR (FLU A&B, COVID) ARPGX2
Influenza A by PCR: NEGATIVE
Influenza B by PCR: NEGATIVE
SARS Coronavirus 2 by RT PCR: NEGATIVE

## 2020-04-30 LAB — ACETAMINOPHEN LEVEL: Acetaminophen (Tylenol), Serum: <10 ug/mL — ABNORMAL LOW (ref 10–30)

## 2020-04-30 LAB — ETHANOL: Alcohol, Ethyl (B): <10 mg/dL (ref <10)

## 2020-04-30 LAB — SALICYLATE LEVEL: Salicylate Lvl: <7 mg/dL — ABNORMAL LOW (ref 7.0–30.0)

## 2020-04-30 LAB — VALPROIC ACID LEVEL: Valproic Acid Lvl: 30 ug/mL — ABNORMAL LOW (ref 50.0–100.0)

## 2020-04-30 MED ORDER — HALOPERIDOL 5 MG PO TABS: 5.0000 mg | ORAL_TABLET | Freq: Four times a day (QID) | ORAL | Status: DC | PRN

## 2020-04-30 MED ORDER — OLANZAPINE 5 MG PO TBDP
10.0000 mg | ORAL_TABLET | Freq: Once | ORAL | Status: AC
  Administered 2020-04-30: 10 mg via ORAL
  Filled 2020-04-30: qty 2

## 2020-04-30 MED ORDER — CLOPIDOGREL BISULFATE 75 MG PO TABS: 75.0000 mg | ORAL_TABLET | Freq: Every day | ORAL | Status: DC

## 2020-04-30 MED ORDER — DIPHENHYDRAMINE HCL 50 MG/ML IJ SOLN
50.0000 mg | Freq: Once | INTRAMUSCULAR | Status: AC
  Administered 2020-04-30: 50 mg via INTRAMUSCULAR
  Filled 2020-04-30: qty 1

## 2020-04-30 MED ORDER — CLOZAPINE 25 MG PO TABS
50.0000 mg | ORAL_TABLET | Freq: Every day | ORAL | Status: DC
Start: 2020-04-30 — End: 2020-05-01
  Administered 2020-04-30: 50 mg via ORAL
  Filled 2020-04-30: qty 2

## 2020-04-30 MED ORDER — DIVALPROEX SODIUM 500 MG PO DR TAB
750.0000 mg | DELAYED_RELEASE_TABLET | Freq: Two times a day (BID) | ORAL | Status: DC
  Administered 2020-04-30: 750 mg via ORAL
  Filled 2020-04-30: qty 1

## 2020-04-30 MED ORDER — CLOZAPINE 100 MG PO TABS: 300.0000 mg | ORAL_TABLET | Freq: Every day | ORAL | Status: DC

## 2020-04-30 MED ORDER — LORAZEPAM 2 MG/ML IJ SOLN
2.0000 mg | Freq: Once | INTRAMUSCULAR | Status: AC
  Administered 2020-04-30: 2 mg via INTRAMUSCULAR

## 2020-04-30 MED ORDER — GABAPENTIN 300 MG PO CAPS
300.0000 mg | ORAL_CAPSULE | Freq: Three times a day (TID) | ORAL | Status: DC
  Administered 2020-04-30: 300 mg via ORAL
  Filled 2020-04-30: qty 1

## 2020-04-30 MED ORDER — LORAZEPAM 2 MG/ML IJ SOLN
2.0000 mg | Freq: Once | INTRAMUSCULAR | Status: DC
  Filled 2020-04-30: qty 1

## 2020-04-30 MED ORDER — ZOLPIDEM TARTRATE 5 MG PO TABS: 5.0000 mg | ORAL_TABLET | Freq: Every day | ORAL | Status: DC

## 2020-04-30 MED ORDER — HALOPERIDOL 5 MG PO TABS
5.0000 mg | ORAL_TABLET | Freq: Two times a day (BID) | ORAL | Status: DC
  Administered 2020-04-30: 5 mg via ORAL
  Filled 2020-04-30: qty 1

## 2020-04-30 MED ORDER — PANTOPRAZOLE SODIUM 40 MG PO TBEC
40.0000 mg | DELAYED_RELEASE_TABLET | Freq: Two times a day (BID) | ORAL | Status: DC
  Administered 2020-04-30: 40 mg via ORAL
  Filled 2020-04-30: qty 1

## 2020-04-30 MED ORDER — LORAZEPAM 1 MG PO TABS
1.0000 mg | ORAL_TABLET | Freq: Two times a day (BID) | ORAL | Status: DC
  Administered 2020-04-30: 1 mg via ORAL
  Filled 2020-04-30: qty 1

## 2020-04-30 MED ORDER — LORAZEPAM 2 MG PO TABS: 2.0000 mg | ORAL_TABLET | Freq: Once | ORAL | Status: DC

## 2020-04-30 NOTE — ED Notes (Signed)
Given lunch

## 2020-04-30 NOTE — ED Notes (Signed)
Up to bathroom prior to obtaining sample

## 2020-04-30 NOTE — ED Notes (Signed)
meds given, pt banging on door, in bathroom for 30 minutes playing with toilet paper.  md aware.  meds given.

## 2020-04-30 NOTE — TOC Progression Note (Signed)
Transition of Care [facility]) - Progression Note    Patient Details  Name: Austin Gates MRN: [id number] Date of Birth: 09/06/1960  Transition of Care [facility]) CM/SW [place], [place] Phone Number:  [phone] 04/30/2020, 2:05 PM  Clinical Narrative:     Patient presents at [facility] due to behavioral issues.  Patient has diagnosis of IDD and schizophrenia.  Patient is currently IVC pending TTS evaluation.  [place] Coordinator, [place] [facility] [phone], called for update on patient.  CSW updated Ms. Johnsie Cancel.      Expected Discharge Plan and Services                                                 Social Determinants of Health (SDOH) Interventions    Readmission Risk Interventions No flowsheet data found.

## 2020-04-30 NOTE — ED Notes (Signed)
Pt now IVC.

## 2020-04-30 NOTE — ED Notes (Signed)
Pt sleeping in bed

## 2020-04-30 NOTE — Consult Note (Signed)
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 male patient admitted with "I am okay".  HPI: Patient seen chart reviewed.  Patient known from previous encounters.  Austin Gates has a lifelong history of intellectual disability with a documented IQ of 43.  He also has schizoaffective disorder.  He has a long history of behavior problems as a result that have made it difficult to manage him in multiple settings.  Currently he just got out of jail where he had been sent for assaulting someone at [facility].  They discharged him not back to a group home but to his guardian who is his sister.  She has made 2 attempts now to manage him at home but he continues to be agitated lowered aggressive impossible to redirect.  It does not appear that he had been maintained on his optimal medication profile while he was in jail because I found no record of him having been on clozapine or antipsychotics.  Currently the patient has no specific complaints.  Behavior is labile.  Mostly calm but he of course has the chronic risk of exploding  Past Psychiatric History: Long history of behavior problems probably mostly related to his intellectual disability  Risk to Self:   Risk to Others:   Prior Inpatient Therapy:   Prior Outpatient Therapy:    Past Medical History:  Past Medical History:  Diagnosis Date  . Hypertension   . Intellectual disability    . Obsessive-compulsive disorder   . Schizo-affective schizophrenia [facility])     Past Surgical History:  Procedure Laterality Date  . COLONOSCOPY WITH PROPOFOL N/A 12/28/2017   Procedure: COLONOSCOPY WITH PROPOFOL;  Surgeon: Jonathon Bellows, MD;  Location: [facility] ENDOSCOPY;  Service: Gastroenterology;  Laterality: N/A;  . HYDROCELE EXCISION Bilateral 02/22/2018   Procedure: bilateral HYDROCELECTOMY ADULT;  Surgeon: Cleon Gustin, MD;  Location: [place];  Service: Urology;  Laterality: Bilateral;  . INSERTION OF ILIAC STENT Left 02/22/2018   Procedure: INSERTION LEFT SUPERIOR FEMORAL ARTERY USING 6MM X 5CM VIABHON STENT with mynx device closure on right femoral artery;  Surgeon: Waynetta Sandy, MD;  Location: [place];  Service: Vascular;  Laterality: Left;  . KNEE ARTHROSCOPY    . SCROTAL EXPLORATION N/A 02/22/2018   Procedure: SCROTUM EXPLORATION;  Surgeon: Cleon Gustin, MD;  Location: [place];  Service: Urology;  Laterality: N/A;  . UPPER EXTREMITY ANGIOGRAM Left 02/22/2018   Procedure: left lower EXTREMITY ANGIOGRAM;  Surgeon: Waynetta Sandy, MD;  Location: [facility] OR;  Service: Vascular;  Laterality: Left;   Family History:  Family History  Problem Relation Age of Onset  . Diabetes Mother    Family Psychiatric  History: See prior Social History:  Social History   Substance and Sexual Activity  Alcohol Use No     Social History   Substance and Sexual Activity  Drug Use No    Social History   Socioeconomic History  . Marital status: Single    Spouse name: Not on file  . Number of children:  Not on file  . Years of education: Not on file  . Highest education level: Not on file  Occupational History  . Occupation: Disabled  Tobacco Use  . Smoking status: Former Smoker    Types: Cigarettes  . Smokeless tobacco: Never Used  Vaping Use  . Vaping Use: Unknown  Substance and Sexual Activity  . Alcohol use: No  . Drug use: No  . Sexual activity: Never  Other  Topics Concern  . Not on file  Social History Narrative   ** Merged History Encounter **       Social Determinants of Health   Financial Resource Strain: Not on file  Food Insecurity: Not on file  Transportation Needs: Not on file  Physical Activity: Not on file  Stress: Not on file  Social Connections: Not on file   Additional Social History:    Allergies:  No Known Allergies  Labs:  Results for orders placed or performed during the hospital encounter of 04/30/20 (from the past 48 hour(s))  Valproic acid level     Status: Abnormal   Collection Time: 04/30/20 11:51 AM  Result Value Ref Range   Valproic Acid Lvl 30 (L) 50.0 - 100.0 ug/mL    Comment: Performed at [facility], [address] [place], [place] [address]  Comprehensive metabolic panel     Status: Abnormal   Collection Time: 04/30/20 11:55 AM  Result Value Ref Range   Sodium 139 135 - 145 mmol/L   Potassium 4.2 3.5 - 5.1 mmol/L   Chloride 105 98 - 111 mmol/L   CO2 29 22 - 32 mmol/L   Glucose, Bld 75 70 - 99 mg/dL    Comment: Glucose reference range applies only to samples taken after fasting for at least 8 hours.   BUN 22 (H) 6 - 20 mg/dL   Creatinine, Ser 0.85 0.61 - 1.24 mg/dL   Calcium 9.1 8.9 - 10.3 mg/dL   Total Protein 7.4 6.5 - 8.1 g/dL   Albumin 3.6 3.5 - 5.0 g/dL   AST 18 15 - 41 U/L   ALT 14 0 - 44 U/L   Alkaline Phosphatase 57 38 - 126 U/L   Total Bilirubin 0.4 0.3 - 1.2 mg/dL   GFR, Estimated >60 >60 mL/min    Comment: (NOTE) Calculated using the CKD-EPI Creatinine Equation (2021)    Anion gap 5 5 - 15    Comment: Performed at [facility], [address] [place], [place] [address]  Ethanol     Status: None   Collection Time: 04/30/20 11:55 AM  Result Value Ref Range   Alcohol, Ethyl (B) <10 <10 mg/dL    Comment: (NOTE) Lowest detectable limit for serum alcohol is 10 mg/dL.  For medical purposes only. Performed at [facility], [address]  [place], [place] [address]   Salicylate level     Status: Abnormal   Collection Time: 04/30/20 11:55 AM  Result Value Ref Range   Salicylate Lvl <4.4 (L) 7.0 - 30.0 mg/dL    Comment: Performed at [facility], [place], [place] [address]  Acetaminophen level     Status: Abnormal   Collection Time: 04/30/20 11:55 AM  Result Value Ref Range   Acetaminophen (Tylenol), Serum <10 (L) 10 - 30 ug/mL    Comment: (NOTE) Therapeutic concentrations vary significantly. A range of 10-30 ug/mL  may be an effective concentration for many patients. However, some  are best treated at concentrations outside of  this range. Acetaminophen concentrations >150 ug/mL at 4 hours after ingestion  and >50 ug/mL at 12 hours after ingestion are often associated with  toxic reactions.  Performed at [facility], [address] [place], [place] [address]   cbc     Status: Abnormal   Collection Time: 04/30/20 11:55 AM  Result Value Ref Range   WBC 3.9 (L) 4.0 - 10.5 K/uL   RBC 3.83 (L) 4.22 - 5.81 MIL/uL   Hemoglobin 11.2 (L) 13.0 - 17.0 g/dL   HCT 35.1 (L) 39.0 - 52.0 %   MCV 91.6 80.0 - 100.0 fL   MCH 29.2 26.0 - 34.0 pg   MCHC 31.9 30.0 - 36.0 g/dL   RDW 14.4 11.5 - 15.5 %   Platelets 194 150 - 400 K/uL   nRBC 0.0 0.0 - 0.2 %    Comment: Performed at [facility], [address], [place], [place] [address]  Urine Drug Screen, Qualitative     Status: None   Collection Time: 04/30/20 11:55 AM  Result Value Ref Range   Tricyclic, Ur Screen NONE DETECTED NONE DETECTED   Amphetamines, Ur Screen NONE DETECTED NONE DETECTED   MDMA (Ecstasy)Ur Screen NONE DETECTED NONE DETECTED   Cocaine Metabolite,Ur [place] NONE DETECTED NONE DETECTED   Opiate, Ur Screen NONE DETECTED NONE DETECTED   Phencyclidine (PCP) Ur S NONE DETECTED NONE DETECTED   Cannabinoid 50 Ng, Ur [place] NONE DETECTED NONE DETECTED   Barbiturates, Ur Screen NONE DETECTED NONE DETECTED   Benzodiazepine, Ur Scrn  NONE DETECTED NONE DETECTED   Methadone Scn, Ur NONE DETECTED NONE DETECTED    Comment: (NOTE) Tricyclics + metabolites, urine    Cutoff 1000 ng/mL Amphetamines + metabolites, urine  Cutoff 1000 ng/mL MDMA (Ecstasy), urine              Cutoff 500 ng/mL Cocaine Metabolite, urine          Cutoff 300 ng/mL Opiate + metabolites, urine        Cutoff 300 ng/mL Phencyclidine (PCP), urine         Cutoff 25 ng/mL Cannabinoid, urine                 Cutoff 50 ng/mL Barbiturates + metabolites, urine  Cutoff 200 ng/mL Benzodiazepine, urine              Cutoff 200 ng/mL Methadone, urine                   Cutoff 300 ng/mL  The urine drug screen provides only a preliminary, unconfirmed analytical test result and should not be used for non-medical purposes. Clinical consideration and professional judgment should be applied to any positive drug screen result due to possible interfering substances. A more specific alternate chemical method must be used in order to obtain a confirmed analytical result. Gas chromatography / mass spectrometry (GC/MS) is the preferred confirm atory method. Performed at [facility], [address] [place], [place] [address]   Resp Panel by RT-PCR (Flu A&B, Covid) Nasopharyngeal Swab     Status: None   Collection Time: 04/30/20  1:59 PM   Specimen: Nasopharyngeal Swab; Nasopharyngeal(NP) swabs in vial transport medium  Result Value Ref Range   SARS Coronavirus 2 by RT PCR NEGATIVE NEGATIVE    Comment: (NOTE) SARS-CoV-2 target nucleic acids are NOT DETECTED.  The SARS-CoV-2 RNA is generally detectable in upper respiratory specimens during the acute phase of infection. The lowest concentration of SARS-CoV-2 viral  copies this assay can detect is 138 copies/mL. A negative result does not preclude SARS-Cov-2 infection and should not be used as the sole basis for treatment or other patient management decisions. A negative result may occur with  improper specimen  collection/handling, submission of specimen other than nasopharyngeal swab, presence of viral mutation(s) within the areas targeted by this assay, and inadequate number of viral copies(<138 copies/mL). A negative result must be combined with clinical observations, patient history, and epidemiological information. The expected result is Negative.  Fact Sheet for Patients:  EntrepreneurPulse.com.au  Fact Sheet for Healthcare Providers:  IncredibleEmployment.be  This test is no t yet approved or cleared by the [place] FDA and  has been authorized for detection and/or diagnosis of SARS-CoV-2 by FDA under an Emergency Use Authorization (EUA). This EUA will remain  in effect (meaning this test can be used) for the duration of the COVID-19 declaration under Section 564(b)(1) of the Act, 21 U.S.C.section 360bbb-3(b)(1), unless the authorization is terminated  or revoked sooner.       Influenza A by PCR NEGATIVE NEGATIVE   Influenza B by PCR NEGATIVE NEGATIVE    Comment: (NOTE) The Xpert Xpress SARS-CoV-2/FLU/RSV plus assay is intended as an aid in the diagnosis of influenza from Nasopharyngeal swab specimens and should not be used as a sole basis for treatment. Nasal washings and aspirates are unacceptable for Xpert Xpress SARS-CoV-2/FLU/RSV testing.  Fact Sheet for Patients: EntrepreneurPulse.com.au  Fact Sheet for Healthcare Providers: IncredibleEmployment.be  This test is not yet approved or cleared by the [place] FDA and has been authorized for detection and/or diagnosis of SARS-CoV-2 by FDA under an Emergency Use Authorization (EUA). This EUA will remain in effect (meaning this test can be used) for the duration of the COVID-19 declaration under Section 564(b)(1) of the Act, 21 U.S.C. section 360bbb-3(b)(1), unless the authorization is terminated or revoked.  Performed at [facility], [address] [place], [place] [address]     Current Facility-Administered Medications  Medication Dose Route Frequency Provider Last Rate Last Admin  . clopidogrel (PLAVIX) tablet 75 mg  75 mg Oral Daily Duffy Bruce, MD      . cloZAPine (CLOZARIL) tablet 50 mg  50 mg Oral QHS Sharvi Mooneyhan T, MD      . divalproex (DEPAKOTE) DR tablet 750 mg  750 mg Oral BID Duffy Bruce, MD      . gabapentin (NEURONTIN) capsule 300 mg  300 mg Oral TID Duffy Bruce, MD      . haloperidol (HALDOL) tablet 5 mg  5 mg Oral Q6H PRN Duffy Bruce, MD      . haloperidol (HALDOL) tablet 5 mg  5 mg Oral BID Niylah Hassan T, MD      . LORazepam (ATIVAN) tablet 1 mg  1 mg Oral BID Duffy Bruce, MD      . pantoprazole (PROTONIX) EC tablet 40 mg  40 mg Oral BID Duffy Bruce, MD       Current Outpatient Medications  Medication Sig Dispense Refill  . benztropine (COGENTIN) 0.5 MG tablet Take 0.5 mg by mouth 2 (two) times daily.    . divalproex (DEPAKOTE) 500 MG DR tablet Take 500 mg by mouth 3 (three) times daily.    . Ensure (ENSURE) Take 237 mLs by mouth in the morning and at bedtime.    . ferrous sulfate 325 (65 FE) MG tablet Take 325 mg by mouth daily with breakfast.    . food thickener (THICK IT) POWD  Take by mouth in the morning, at noon, and at bedtime.    Marland Kitchen lactulose (CHRONULAC) 10 GM/15ML solution Take 20 g by mouth daily.    Marland Kitchen levOCARNitine (CARNITOR) 330 MG tablet Take 990 mg by mouth 3 (three) times daily.    . melatonin 3 MG TABS tablet Take 3 mg by mouth at bedtime as needed (sleep).    . midodrine (PROAMATINE) 10 MG tablet Take 10 mg by mouth 3 (three) times daily.      Musculoskeletal: Strength & Muscle Tone: within normal limits Gait & Station: normal Patient leans: N/A            Psychiatric Specialty Exam:  Presentation  General Appearance: No data recorded Eye Contact:No data recorded Speech:No data recorded Speech Volume:No data recorded Handedness:No  data recorded  Mood and Affect  Mood:No data recorded Affect:No data recorded  Thought Process  Thought Processes:No data recorded Descriptions of Associations:No data recorded Orientation:No data recorded Thought Content:No data recorded History of Schizophrenia/Schizoaffective disorder:No data recorded Duration of Psychotic Symptoms:No data recorded Hallucinations:No data recorded Ideas of Reference:No data recorded Suicidal Thoughts:No data recorded Homicidal Thoughts:No data recorded  Sensorium  Memory:No data recorded Judgment:No data recorded Insight:No data recorded  Executive Functions  Concentration:No data recorded Attention Span:No data recorded Recall:No data recorded Fund of Knowledge:No data recorded Language:No data recorded  Psychomotor Activity  Psychomotor Activity:No data recorded  Assets  Assets:No data recorded  Sleep  Sleep:No data recorded  Physical Exam: Physical Exam Vitals and nursing note reviewed.  Constitutional:      Appearance: Normal appearance.  HENT:     Head: Normocephalic and atraumatic.     Mouth/Throat:     Pharynx: Oropharynx is clear.  Eyes:     Pupils: Pupils are equal, round, and reactive to light.  Cardiovascular:     Rate and Rhythm: Normal rate and regular rhythm.  Pulmonary:     Effort: Pulmonary effort is normal.     Breath sounds: Normal breath sounds.  Abdominal:     General: Abdomen is flat.     Palpations: Abdomen is soft.  Musculoskeletal:        General: Normal range of motion.  Skin:    General: Skin is warm and dry.  Neurological:     General: No focal deficit present.     Mental Status: He is alert. Mental status is at baseline.  Psychiatric:        Attention and Perception: He is inattentive.        Mood and Affect: Affect is labile and inappropriate.        Speech: Speech is slurred.        Behavior: Behavior is agitated. Behavior is not aggressive.        Thought Content: Thought content  is delusional.        Cognition and Memory: Cognition is impaired. Memory is impaired.        Judgment: Judgment is inappropriate.    Review of Systems  Constitutional: Negative.   HENT: Negative.   Eyes: Negative.   Respiratory: Negative.   Cardiovascular: Negative.   Gastrointestinal: Negative.   Musculoskeletal: Negative.   Skin: Negative.   Neurological: Negative.   Psychiatric/Behavioral: Negative.    Blood pressure 128/81, pulse 71, temperature (!) 97.5 F (36.4 C), temperature source Oral, resp. rate 20, SpO2 98 %. There is no height or weight on file to calculate BMI.  Treatment Plan Summary: Daily contact with patient to assess and evaluate symptoms  and progress in treatment, Medication management and Plan 60 year old man who is well-known to the psychiatric service.  At this point it seems unlikely that his guardian is going to be able to manage him at home given that she brought him back within just a day.  Patient really needs must closer management and medication use then had been provided while he was in jail.  Unfortunately he is also very difficult to place.  For now restarting medicine although starting clozapine at a conservative dose of only 50 mg at night which can then be titrated up.  We will probably be looking at admission sooner or later.  Disposition: Recommend psychiatric Inpatient admission when medically cleared. Supportive therapy provided about ongoing stressors. Discussed crisis plan, support from social network, calling 911, coming to the Emergency Department, and calling Suicide Hotline.  Alethia Berthold, MD 04/30/2020 5:58 PM

## 2020-04-30 NOTE — Progress Notes (Signed)
Pharmacy - Clozapine     This patient's order has been reviewed for prescribing contraindications.   Labs:   3/10 ANC 2400  The medication is being dispensed pursuant to the FDA REMS suspension order of 01/10/20 that allows for dispensing without a patient REMS dispense authorization (RDA).    Tawnya Crook, PharmD Clinical Pharmacist

## 2020-04-30 NOTE — ED Notes (Signed)
Pt states he is unsure why he is here and wants to leave. Pt in [place] at this time with who he reports as sister at his bedside who brought patient.

## 2020-04-30 NOTE — ED Notes (Addendum)
Pt dressed out in hospital attire. Pt's belongings to include: 1 blue jacket 1 green shirt 1 pair of jeans 2 white socks 2 brown slippers 1 white shirt 1 black belt  guardian taking pt's clothes home with her.

## 2020-04-30 NOTE — ED Notes (Signed)
ED Provider at bedside. 

## 2020-04-30 NOTE — ED Notes (Signed)
Pt continues to stand at door and knock despite being reminded that it is unlocked and open. Reoriented to bed.

## 2020-04-30 NOTE — ED Notes (Addendum)
Legal guardian and caregiver at bedside states that she needs to leave and go home for a while. Explained that pt still voluntary and requested that she stay until psych can see patient. States that she needs to leave. Verbalizes understanding that pt is VOLUNTARY at this time. Phone number verified.

## 2020-04-30 NOTE — ED Notes (Signed)
Resumed care from [facility] r[facility].  Orders for move to [facility].  Pt alert  Pt is talkative.
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
## 2020-04-30 NOTE — BH Assessment (Signed)
Comprehensive Clinical Assessment (CCA) Screening, Triage and Referral Note  04/30/2020 Austin Gates [id number]  Chief Complaint:  Chief Complaint  Patient presents with  . medical evaluation   Visit Diagnosis: Schizoaffective Disorder  Patient Reported Information How did you hear about [place]? -- Risk manager)   Referral name: No data recorded  Referral phone number: No data recorded Whom do you see for routine medical problems? Other (Comment)   Practice/Facility Name: No data recorded  Practice/Facility Phone Number: No data recorded  Name of Contact: No data recorded  Contact Number: No data recorded  Contact Fax Number: No data recorded  Prescriber Name: No data recorded  Prescriber Address (if known): No data recorded What Is the Reason for Your Visit/Call Today? Brought in from the local store for odd behaviors.  How Long Has This Been Causing You Problems? 1 wk - 1 month  Have You Recently Been in Any Inpatient Treatment (Hospital/Detox/Crisis Center/28-Day Program)? No   Name/Location of Program/Hospital:No data recorded  How Long Were You There? No data recorded  When Were You Discharged? No data recorded Have You Ever Received Services From [facility] Before? Yes   Who Do You See at [facility]? Medical and Mental Health Treatment  Have You Recently Had Any Thoughts About Hurting Yourself? No   Are You Planning to Commit Suicide/Harm Yourself At This time?  No  Have you Recently Had Thoughts About [place]? No   Explanation: No data recorded Have You Used Any Alcohol or Drugs in the Past 24 Hours? No   How Long Ago Did You Use Drugs or Alcohol?  No data recorded  What Did You Use and How Much? No data recorded What Do You Feel Would Help You the Most Today? Other (Comment)  Do You Currently Have a Therapist/Psychiatrist? No   Name of Therapist/Psychiatrist: No data recorded  Have You Been Recently Discharged From Any Office Practice  or Programs? No   Explanation of Discharge From Practice/Program:  No data recorded    CCA Screening Triage Referral Assessment Type of Contact: Face-to-Face   Is this Initial or Reassessment? No data recorded  Date Telepsych consult ordered in [facility]:  No data recorded  Time Telepsych consult ordered in [facility]:  No data recorded Patient Reported Information Reviewed? Yes   Patient Left Without Being Seen? No data recorded  Reason for Not Completing Assessment: No data recorded Collateral Involvement: None reported  Does Patient Have a [place]? No data recorded  Name and Contact of Legal Guardian:  No data recorded If Minor and Not Living with Parent(s), Who has Custody? No data recorded Is CPS involved or ever been involved? Never  Is APS involved or ever been involved? Never  Patient Determined To Be At Risk for Harm To Self or Others Based on Review of Patient Reported Information or Presenting Complaint? No   Method: No data recorded  Availability of Means: No data recorded  Intent: No data recorded  Notification Required: No data recorded  Additional Information for Danger to Others Potential:  No data recorded  Additional Comments for Danger to Others Potential:  No data recorded  Are There Guns or Other Weapons in Your Home?  No data recorded   Types of Guns/Weapons: No data recorded   Are These Weapons Safely Secured?  No data recorded   Who Could Verify You Are Able To Have These Secured:    No data recorded Do You Have any Outstanding Charges, Pending Court Dates, Parole/Probation? No data recorded Contacted To Inform of Risk of Harm To Self or Others: No data recorded Location of Assessment: [facility] ED  Does Patient Present under Involuntary Commitment? Yes   IVC Papers Initial File Date: 04/30/2020   [place] of Residence: [place]  Patient Currently Receiving the Following Services: Not Receiving  Services   Determination of Need: Emergent (2 hours)   Options For Referral: No data recorded  Gunnar Fusi MS, LCAS, [facility], [facility] Therapeutic Triage Specialist 04/30/2020 6:08 PM

## 2020-04-30 NOTE — ED Notes (Signed)
Report to Amy, RN
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
## 2020-04-30 NOTE — ED Notes (Signed)
Pt laying in bed. NAD noted. Pt resting with eyes closed. Unable to do shift assessment for SI/HI on pt as he is sleeping at this time. NAD noted.

## 2020-04-30 NOTE — ED Notes (Signed)
Pt sleeping. 

## 2020-05-01 ENCOUNTER — Emergency Department: Payer: Medicaid Other

## 2020-05-01 DIAGNOSIS — R680 Hypothermia, not associated with low environmental temperature: Secondary | ICD-10-CM | POA: Diagnosis present

## 2020-05-01 DIAGNOSIS — Z833 Family history of diabetes mellitus: Secondary | ICD-10-CM | POA: Diagnosis not present

## 2020-05-01 DIAGNOSIS — K5901 Slow transit constipation: Secondary | ICD-10-CM | POA: Diagnosis present

## 2020-05-01 DIAGNOSIS — F250 Schizoaffective disorder, bipolar type: Secondary | ICD-10-CM

## 2020-05-01 DIAGNOSIS — K219 Gastro-esophageal reflux disease without esophagitis: Secondary | ICD-10-CM | POA: Diagnosis present

## 2020-05-01 DIAGNOSIS — I10 Essential (primary) hypertension: Secondary | ICD-10-CM | POA: Diagnosis present

## 2020-05-01 DIAGNOSIS — R6510 Systemic inflammatory response syndrome (SIRS) of non-infectious origin without acute organ dysfunction: Secondary | ICD-10-CM | POA: Diagnosis present

## 2020-05-01 DIAGNOSIS — F429 Obsessive-compulsive disorder, unspecified: Secondary | ICD-10-CM | POA: Diagnosis present

## 2020-05-01 DIAGNOSIS — G9341 Metabolic encephalopathy: Secondary | ICD-10-CM | POA: Diagnosis present

## 2020-05-01 DIAGNOSIS — D61818 Other pancytopenia: Secondary | ICD-10-CM

## 2020-05-01 DIAGNOSIS — I959 Hypotension, unspecified: Secondary | ICD-10-CM

## 2020-05-01 DIAGNOSIS — J9601 Acute respiratory failure with hypoxia: Secondary | ICD-10-CM | POA: Diagnosis present

## 2020-05-01 DIAGNOSIS — D509 Iron deficiency anemia, unspecified: Secondary | ICD-10-CM | POA: Diagnosis present

## 2020-05-01 DIAGNOSIS — Z720 Tobacco use: Secondary | ICD-10-CM | POA: Diagnosis not present

## 2020-05-01 DIAGNOSIS — T68XXXA Hypothermia, initial encounter: Secondary | ICD-10-CM | POA: Diagnosis present

## 2020-05-01 DIAGNOSIS — T424X5A Adverse effect of benzodiazepines, initial encounter: Secondary | ICD-10-CM | POA: Diagnosis present

## 2020-05-01 DIAGNOSIS — R471 Dysarthria and anarthria: Secondary | ICD-10-CM | POA: Diagnosis present

## 2020-05-01 DIAGNOSIS — Z20822 Contact with and (suspected) exposure to covid-19: Secondary | ICD-10-CM | POA: Diagnosis present

## 2020-05-01 DIAGNOSIS — R4689 Other symptoms and signs involving appearance and behavior: Secondary | ICD-10-CM

## 2020-05-01 DIAGNOSIS — F71 Moderate intellectual disabilities: Secondary | ICD-10-CM | POA: Diagnosis present

## 2020-05-01 DIAGNOSIS — J45909 Unspecified asthma, uncomplicated: Secondary | ICD-10-CM | POA: Diagnosis present

## 2020-05-01 DIAGNOSIS — I951 Orthostatic hypotension: Secondary | ICD-10-CM | POA: Diagnosis present

## 2020-05-01 DIAGNOSIS — D61811 Other drug-induced pancytopenia: Secondary | ICD-10-CM | POA: Diagnosis present

## 2020-05-01 DIAGNOSIS — Z79899 Other long term (current) drug therapy: Secondary | ICD-10-CM | POA: Diagnosis not present

## 2020-05-01 DIAGNOSIS — Z793 Long term (current) use of hormonal contraceptives: Secondary | ICD-10-CM | POA: Diagnosis not present

## 2020-05-01 DIAGNOSIS — R000 Tachycardia, unspecified: Secondary | ICD-10-CM | POA: Diagnosis present

## 2020-05-01 DIAGNOSIS — R1310 Dysphagia, unspecified: Secondary | ICD-10-CM | POA: Diagnosis present

## 2020-05-01 DIAGNOSIS — Z87891 Personal history of nicotine dependence: Secondary | ICD-10-CM | POA: Diagnosis not present

## 2020-05-01 LAB — BLOOD GAS, ARTERIAL
Acid-Base Excess: 3.9 mmol/L — ABNORMAL HIGH (ref 0.0–2.0)
Bicarbonate: 28.5 mmol/L — ABNORMAL HIGH (ref 20.0–28.0)
FIO2: 0.4
O2 Saturation: 97.3 %
Patient temperature: 32.8
pCO2 arterial: 35 mmHg (ref 32.0–48.0)
pH, Arterial: 7.5 — ABNORMAL HIGH (ref 7.350–7.450)
pO2, Arterial: 69 mmHg — ABNORMAL LOW (ref 83.0–108.0)

## 2020-05-01 LAB — CBC WITH DIFFERENTIAL/PLATELET
Abs Immature Granulocytes: 0.01 10*3/uL (ref 0.00–0.07)
Basophils Absolute: 0 10*3/uL (ref 0.0–0.1)
Basophils Relative: 1 %
Eosinophils Absolute: 0.1 10*3/uL (ref 0.0–0.5)
Eosinophils Relative: 3 %
HCT: 39.9 % (ref 39.0–52.0)
Hemoglobin: 12.4 g/dL — ABNORMAL LOW (ref 13.0–17.0)
Immature Granulocytes: 1 %
Lymphocytes Relative: 52 %
Lymphs Abs: 1 10*3/uL (ref 0.7–4.0)
MCH: 29 pg (ref 26.0–34.0)
MCHC: 31.1 g/dL (ref 30.0–36.0)
MCV: 93.2 fL (ref 80.0–100.0)
Monocytes Absolute: 0.1 10*3/uL (ref 0.1–1.0)
Monocytes Relative: 6 %
Neutro Abs: 0.7 10*3/uL — ABNORMAL LOW (ref 1.7–7.7)
Neutrophils Relative %: 37 %
Platelets: 164 10*3/uL (ref 150–400)
RBC: 4.28 MIL/uL (ref 4.22–5.81)
RDW: 14.4 % (ref 11.5–15.5)
Smear Review: NORMAL
WBC: 1.9 10*3/uL — ABNORMAL LOW (ref 4.0–10.5)
nRBC: 0 % (ref 0.0–0.2)

## 2020-05-01 LAB — URINALYSIS, ROUTINE W REFLEX MICROSCOPIC
Bacteria, UA: NONE SEEN
Bilirubin Urine: NEGATIVE
Glucose, UA: NEGATIVE mg/dL
Hgb urine dipstick: NEGATIVE
Ketones, ur: NEGATIVE mg/dL
Leukocytes,Ua: NEGATIVE
Nitrite: NEGATIVE
Protein, ur: NEGATIVE mg/dL
Specific Gravity, Urine: 1.005 (ref 1.005–1.030)
Squamous Epithelial / HPF: NONE SEEN (ref 0–5)
pH: 6 (ref 5.0–8.0)

## 2020-05-01 LAB — COMPREHENSIVE METABOLIC PANEL
ALT: 16 U/L (ref 0–44)
AST: 22 U/L (ref 15–41)
Albumin: 3.6 g/dL (ref 3.5–5.0)
Alkaline Phosphatase: 56 U/L (ref 38–126)
Anion gap: 7 (ref 5–15)
BUN: 16 mg/dL (ref 6–20)
CO2: 29 mmol/L (ref 22–32)
Calcium: 9.5 mg/dL (ref 8.9–10.3)
Chloride: 108 mmol/L (ref 98–111)
Creatinine, Ser: 0.87 mg/dL (ref 0.61–1.24)
GFR, Estimated: >60 mL/min (ref >60)
Glucose, Bld: 95 mg/dL (ref 70–99)
Potassium: 3.8 mmol/L (ref 3.5–5.1)
Sodium: 144 mmol/L (ref 135–145)
Total Bilirubin: 0.6 mg/dL (ref 0.3–1.2)
Total Protein: 7.5 g/dL (ref 6.5–8.1)

## 2020-05-01 LAB — CORTISOL: Cortisol, Plasma: 13.5 ug/dL

## 2020-05-01 LAB — STREP PNEUMONIAE URINARY ANTIGEN: Strep Pneumo Urinary Antigen: NEGATIVE

## 2020-05-01 LAB — CREATININE, SERUM
Creatinine, Ser: 0.74 mg/dL (ref 0.61–1.24)
GFR, Estimated: >60 mL/min (ref >60)

## 2020-05-01 LAB — CBC
HCT: 33 % — ABNORMAL LOW (ref 39.0–52.0)
Hemoglobin: 10.5 g/dL — ABNORMAL LOW (ref 13.0–17.0)
MCH: 29 pg (ref 26.0–34.0)
MCHC: 31.8 g/dL (ref 30.0–36.0)
MCV: 91.2 fL (ref 80.0–100.0)
Platelets: 141 10*3/uL — ABNORMAL LOW (ref 150–400)
RBC: 3.62 MIL/uL — ABNORMAL LOW (ref 4.22–5.81)
RDW: 14.3 % (ref 11.5–15.5)
WBC: 3.9 10*3/uL — ABNORMAL LOW (ref 4.0–10.5)
nRBC: 0 % (ref 0.0–0.2)

## 2020-05-01 LAB — TSH: TSH: 0.864 u[IU]/mL (ref 0.350–4.500)

## 2020-05-01 LAB — CBG MONITORING, ED: Glucose-Capillary: 91 mg/dL (ref 70–99)

## 2020-05-01 LAB — AMMONIA: Ammonia: 28 umol/L (ref 9–35)

## 2020-05-01 LAB — COOXEMETRY PANEL
Carboxyhemoglobin: 1.4 % (ref 0.5–1.5)
Methemoglobin: 1 % (ref 0.0–1.5)
O2 Saturation: 97.7 %
Total oxygen content: 96.3 mL/dL

## 2020-05-01 LAB — LACTIC ACID, PLASMA: Lactic Acid, Venous: 1.4 mmol/L (ref 0.5–1.9)

## 2020-05-01 LAB — VALPROIC ACID LEVEL: Valproic Acid Lvl: 20 ug/mL — ABNORMAL LOW (ref 50.0–100.0)

## 2020-05-01 LAB — PROCALCITONIN: Procalcitonin: <0.1 ng/mL

## 2020-05-01 LAB — TROPONIN I (HIGH SENSITIVITY): Troponin I (High Sensitivity): 3 ng/L (ref <18)

## 2020-05-01 LAB — BRAIN NATRIURETIC PEPTIDE: B Natriuretic Peptide: 78.5 pg/mL (ref 0.0–100.0)

## 2020-05-01 LAB — D-DIMER, QUANTITATIVE: D-Dimer, Quant: 0.37 ug/mL-FEU (ref 0.00–0.50)

## 2020-05-01 LAB — HIV ANTIBODY (ROUTINE TESTING W REFLEX): HIV Screen 4th Generation wRfx: NONREACTIVE

## 2020-05-01 LAB — PATHOLOGIST SMEAR REVIEW

## 2020-05-01 MED ORDER — SODIUM CHLORIDE 0.9 % IV BOLUS (SEPSIS)
2000.0000 mL | Freq: Once | INTRAVENOUS | Status: AC
  Administered 2020-05-01: 2000 mL via INTRAVENOUS

## 2020-05-01 MED ORDER — DIVALPROEX SODIUM 500 MG PO DR TAB
500.0000 mg | DELAYED_RELEASE_TABLET | Freq: Three times a day (TID) | ORAL | Status: DC
Start: 2020-05-01 — End: 2020-05-07
  Administered 2020-05-01 – 2020-05-07 (×19): 500 mg via ORAL
  Filled 2020-05-01 (×22): qty 1

## 2020-05-01 MED ORDER — THIAMINE HCL 100 MG/ML IJ SOLN
500.0000 mg | Freq: Every day | INTRAVENOUS | Status: AC
  Administered 2020-05-01 – 2020-05-05 (×4): 500 mg via INTRAVENOUS
  Filled 2020-05-01 (×5): qty 5

## 2020-05-01 MED ORDER — DOCUSATE SODIUM 100 MG PO CAPS: 100.0000 mg | ORAL_CAPSULE | Freq: Two times a day (BID) | ORAL | Status: DC | PRN

## 2020-05-01 MED ORDER — FERROUS SULFATE 325 (65 FE) MG PO TABS
325.0000 mg | ORAL_TABLET | Freq: Every day | ORAL | Status: DC
  Administered 2020-05-02 – 2020-05-07 (×6): 325 mg via ORAL
  Filled 2020-05-01 (×7): qty 1

## 2020-05-01 MED ORDER — SODIUM CHLORIDE 0.9 % IV SOLN
2.0000 g | Freq: Once | INTRAVENOUS | Status: AC
  Administered 2020-05-01: 2 g via INTRAVENOUS
  Filled 2020-05-01: qty 2

## 2020-05-01 MED ORDER — ONDANSETRON HCL 4 MG/2ML IJ SOLN: 4.0000 mg | Freq: Four times a day (QID) | INTRAMUSCULAR | Status: DC | PRN

## 2020-05-01 MED ORDER — BENZTROPINE MESYLATE 0.5 MG PO TABS
0.5000 mg | ORAL_TABLET | Freq: Two times a day (BID) | ORAL | Status: DC
  Administered 2020-05-01 – 2020-05-07 (×13): 0.5 mg via ORAL
  Filled 2020-05-01 (×15): qty 1

## 2020-05-01 MED ORDER — DEXTROSE IN LACTATED RINGERS 5 % IV SOLN: INTRAVENOUS | Status: DC

## 2020-05-01 MED ORDER — ENOXAPARIN SODIUM 40 MG/0.4ML ~~LOC~~ SOLN
40.0000 mg | SUBCUTANEOUS | Status: DC
  Administered 2020-05-01 – 2020-05-06 (×6): 40 mg via SUBCUTANEOUS
  Filled 2020-05-01 (×6): qty 0.4

## 2020-05-01 MED ORDER — HALOPERIDOL LACTATE 5 MG/ML IJ SOLN
5.0000 mg | Freq: Four times a day (QID) | INTRAMUSCULAR | Status: DC | PRN
  Administered 2020-05-01 – 2020-05-07 (×8): 5 mg via INTRAVENOUS
  Filled 2020-05-01 (×9): qty 1

## 2020-05-01 MED ORDER — MELATONIN 5 MG PO TABS
5.0000 mg | ORAL_TABLET | Freq: Every evening | ORAL | Status: DC | PRN
  Administered 2020-05-01 – 2020-05-06 (×2): 5 mg via ORAL
  Filled 2020-05-01 (×3): qty 1

## 2020-05-01 MED ORDER — MIDODRINE HCL 5 MG PO TABS
10.0000 mg | ORAL_TABLET | Freq: Three times a day (TID) | ORAL | Status: DC
  Administered 2020-05-01 – 2020-05-07 (×17): 10 mg via ORAL
  Filled 2020-05-01 (×17): qty 2

## 2020-05-01 MED ORDER — MELATONIN 3 MG PO TABS
3.0000 mg | ORAL_TABLET | Freq: Every evening | ORAL | Status: DC | PRN
  Filled 2020-05-01: qty 1

## 2020-05-01 MED ORDER — VANCOMYCIN HCL IN DEXTROSE 1-5 GM/200ML-% IV SOLN
1000.0000 mg | Freq: Once | INTRAVENOUS | Status: AC
  Administered 2020-05-01: 1000 mg via INTRAVENOUS
  Filled 2020-05-01: qty 200

## 2020-05-01 MED ORDER — CLOZAPINE 25 MG PO TABS: 50.0000 mg | ORAL_TABLET | Freq: Every day | ORAL | Status: DC

## 2020-05-01 MED ORDER — POLYETHYLENE GLYCOL 3350 17 G PO PACK: 17.0000 g | PACK | Freq: Every day | ORAL | Status: DC | PRN

## 2020-05-01 MED ORDER — ENSURE ENLIVE PO LIQD
237.0000 mL | Freq: Two times a day (BID) | ORAL | Status: DC
  Administered 2020-05-02 – 2020-05-07 (×11): 237 mL via ORAL

## 2020-05-01 MED ORDER — HALOPERIDOL 5 MG PO TABS
5.0000 mg | ORAL_TABLET | Freq: Two times a day (BID) | ORAL | Status: DC
  Administered 2020-05-01 – 2020-05-07 (×10): 5 mg via ORAL
  Filled 2020-05-01 (×14): qty 1

## 2020-05-01 MED ORDER — ZIPRASIDONE MESYLATE 20 MG IM SOLR
20.0000 mg | Freq: Once | INTRAMUSCULAR | Status: AC
  Administered 2020-05-02: 20 mg via INTRAMUSCULAR
  Filled 2020-05-01: qty 20

## 2020-05-01 MED ORDER — VANCOMYCIN HCL IN DEXTROSE 1-5 GM/200ML-% IV SOLN
1000.0000 mg | Freq: Two times a day (BID) | INTRAVENOUS | Status: DC
  Administered 2020-05-01 – 2020-05-02 (×2): 1000 mg via INTRAVENOUS
  Filled 2020-05-01 (×3): qty 200

## 2020-05-01 MED ORDER — SODIUM CHLORIDE 0.9 % IV SOLN
2.0000 g | Freq: Three times a day (TID) | INTRAVENOUS | Status: DC
  Administered 2020-05-01 – 2020-05-02 (×4): 2 g via INTRAVENOUS
  Filled 2020-05-01 (×5): qty 2

## 2020-05-01 MED ORDER — ALBUTEROL SULFATE HFA 108 (90 BASE) MCG/ACT IN AERS
2.0000 | INHALATION_SPRAY | RESPIRATORY_TRACT | Status: DC | PRN
  Filled 2020-05-01: qty 6.7

## 2020-05-01 MED ORDER — NALOXONE HCL 2 MG/2ML IJ SOSY
1.0000 mg | PREFILLED_SYRINGE | Freq: Once | INTRAMUSCULAR | Status: AC
  Administered 2020-05-01: 1 mg via INTRAVENOUS
  Filled 2020-05-01: qty 2

## 2020-05-01 MED ORDER — STARCH (THICKENING) PO POWD
ORAL | Status: DC | PRN
  Filled 2020-05-01: qty 227

## 2020-05-01 NOTE — ED Notes (Signed)
This RN in room in response to monitor beeping for low SpO2 level. Pt SpO2 58% with good waveform. Pt was stimulated by RN with SpO2 returning to 97%. Pt is currently on room air.

## 2020-05-01 NOTE — ED Notes (Signed)
Pt hit call bell requesting something to drink.

## 2020-05-01 NOTE — Consult Note (Signed)
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 male patient admitted with "I do not want to".  HPI: Patient seen chart reviewed.  See previous notes.  60 year old man well-known to the psychiatric service with longstanding behavior problems related to moderate intellectual disability and schizoaffective disorder.  Patient had just recently been released from jail in [facility] directly to the custody of his sister who is his legal guardian.  She has been having the expected difficulty managing him at home and also not having appropriate medication provided so far.  Brought him back to the hospital once again with agitation and difficulty controlling his behavior.  Patient here has been observed to have oxygen desaturations.  He is being admitted to the medical service.  I found the patient in a little bit more subdued version of his normal mental state.  He recognized me by name.  Told me that he did not want to be in the hospital.  Did not display however any threatening or aggressive behavior at the time.  Past Psychiatric History: Historically this patient has done best when he is on clozapine and mood stabilizers often needing other antipsychotics on top of it.  I have no idea how  long it is been since he was getting his clozapine but I had chosen to restart it at 50 mg far below his usual outpatient dose.  I recommend restarting the 50 mg of clozapine at night while continuing the Depakote.  Restarted Haldol 5 mg twice a day as well.  I have agreed to allow the involuntary commitment to continue because despite his having a guardian he is liable to suddenly become agitated and often security is uncomfortable getting involved physically if the patient is not under IVC.  Patient has had admissions to the psychiatric ward in the past primarily for the purpose of awaiting placement.  He does not particularly benefit from inpatient level treatment.  Risk to Self:   Risk to Others:   Prior Inpatient Therapy:   Prior Outpatient Therapy:    Past Medical History:  Past Medical History:  Diagnosis Date  . Hypertension   . Intellectual disability   . Obsessive-compulsive disorder   . Schizo-affective schizophrenia [facility])     Past Surgical History:  Procedure Laterality Date  . COLONOSCOPY WITH PROPOFOL N/A 12/28/2017   Procedure: COLONOSCOPY WITH PROPOFOL;  Surgeon: Jonathon Bellows, MD;  Location: [facility] ENDOSCOPY;  Service: Gastroenterology;  Laterality: N/A;  . HYDROCELE EXCISION Bilateral 02/22/2018   Procedure: bilateral HYDROCELECTOMY ADULT;  Surgeon: Cleon Gustin, MD;  Location: [place];  Service: Urology;  Laterality: Bilateral;  . INSERTION OF ILIAC STENT Left 02/22/2018   Procedure: INSERTION LEFT SUPERIOR FEMORAL ARTERY USING 6MM X 5CM VIABHON STENT with mynx device closure on right femoral artery;  Surgeon: Waynetta Sandy, MD;  Location: [place];  Service: Vascular;  Laterality: Left;  . KNEE ARTHROSCOPY    . SCROTAL EXPLORATION N/A 02/22/2018   Procedure: SCROTUM EXPLORATION;  Surgeon: Cleon Gustin, MD;  Location: [place];  Service: Urology;  Laterality: N/A;  . UPPER EXTREMITY ANGIOGRAM Left 02/22/2018   Procedure: left lower EXTREMITY ANGIOGRAM;  Surgeon:  Waynetta Sandy, MD;  Location: [facility] OR;  Service: Vascular;  Laterality: Left;   Family History:  Family History  Problem Relation Age of Onset  . Diabetes Mother    Family Psychiatric  History: None Social History:  Social History   Substance and Sexual Activity  Alcohol Use No     Social History   Substance and Sexual Activity  Drug Use No    Social History   Socioeconomic History  . Marital status: Single    Spouse name: Not on file  . Number of children: Not on file  . Years of education: Not on file  . Highest education level: Not on file  Occupational History  . Occupation: Disabled  Tobacco Use  . Smoking status: Former Smoker    Types: Cigarettes  . Smokeless tobacco: Never Used  Vaping Use  . Vaping Use: Unknown  Substance and Sexual Activity  . Alcohol use: No  . Drug use: No  . Sexual activity: Never  Other Topics Concern  . Not on file  Social History Narrative   ** Merged History Encounter **       Social Determinants of Health   Financial Resource Strain: Not on file  Food Insecurity: Not on file  Transportation Needs: Not on file  Physical Activity: Not on file  Stress: Not on file  Social Connections: Not on file   Additional Social History:    Allergies:  No Known Allergies  Labs:  Results for orders placed or performed during the hospital encounter of 04/30/20 (from the past 48 hour(s))  Valproic acid level     Status: Abnormal   Collection Time: 04/30/20 11:51 AM  Result Value Ref Range   Valproic Acid Lvl 30 (L) 50.0 - 100.0 ug/mL    Comment: Performed at [facility], [address] [place], [place] [address]  CBC with Differential/Platelet     Status: Abnormal   Collection Time: 04/30/20 11:51 AM  Result Value Ref Range   WBC 3.9 (L) 4.0 - 10.5 K/uL   RBC 3.80 (L) 4.22 - 5.81 MIL/uL   Hemoglobin 10.9 (L) 13.0 - 17.0 g/dL   HCT 35.5 (L) 39.0 - 52.0 %   MCV 93.4 80.0 - 100.0 fL   MCH 28.7 26.0 - 34.0 pg    MCHC 30.7 30.0 - 36.0 g/dL   RDW 14.5 11.5 - 15.5 %   Platelets 219 150 - 400 K/uL   nRBC 0.0 0.0 - 0.2 %   Neutrophils Relative % 61 %   Neutro Abs 2.4 1.7 - 7.7 K/uL   Lymphocytes Relative 25 %   Lymphs Abs 1.0 0.7 - 4.0 K/uL   Monocytes Relative 12 %   Monocytes Absolute 0.5 0.1 - 1.0 K/uL   Eosinophils Relative 1 %   Eosinophils Absolute 0.0 0.0 - 0.5 K/uL   Basophils Relative 1 %   Basophils Absolute 0.0 0.0 - 0.1 K/uL   Immature Granulocytes 0 %   Abs Immature Granulocytes 0.00 0.00 - 0.07 K/uL    Comment: Performed at [facility], [address] [place], [place] [address]  Comprehensive metabolic panel     Status: Abnormal  Collection Time: 04/30/20 11:55 AM  Result Value Ref Range   Sodium 139 135 - 145 mmol/L   Potassium 4.2 3.5 - 5.1 mmol/L   Chloride 105 98 - 111 mmol/L   CO2 29 22 - 32 mmol/L   Glucose, Bld 75 70 - 99 mg/dL    Comment: Glucose reference range applies only to samples taken after fasting for at least 8 hours.   BUN 22 (H) 6 - 20 mg/dL   Creatinine, Ser 0.85 0.61 - 1.24 mg/dL   Calcium 9.1 8.9 - 10.3 mg/dL   Total Protein 7.4 6.5 - 8.1 g/dL   Albumin 3.6 3.5 - 5.0 g/dL   AST 18 15 - 41 U/L   ALT 14 0 - 44 U/L   Alkaline Phosphatase 57 38 - 126 U/L   Total Bilirubin 0.4 0.3 - 1.2 mg/dL   GFR, Estimated >60 >60 mL/min    Comment: (NOTE) Calculated using the CKD-EPI Creatinine Equation (2021)    Anion gap 5 5 - 15    Comment: Performed at [facility], [address] [place], [place] [address]  Ethanol     Status: None   Collection Time: 04/30/20 11:55 AM  Result Value Ref Range   Alcohol, Ethyl (B) <10 <10 mg/dL    Comment: (NOTE) Lowest detectable limit for serum alcohol is 10 mg/dL.  For medical purposes only. Performed at [facility], [address] [place], [place] [address]   Salicylate level     Status: Abnormal   Collection Time: 04/30/20 11:55 AM  Result Value Ref Range   Salicylate Lvl <1.9  (L) 7.0 - 30.0 mg/dL    Comment: Performed at [facility], [place], [place] [address]  Acetaminophen level     Status: Abnormal   Collection Time: 04/30/20 11:55 AM  Result Value Ref Range   Acetaminophen (Tylenol), Serum <10 (L) 10 - 30 ug/mL    Comment: (NOTE) Therapeutic concentrations vary significantly. A range of 10-30 ug/mL  may be an effective concentration for many patients. However, some  are best treated at concentrations outside of this range. Acetaminophen concentrations >150 ug/mL at 4 hours after ingestion  and >50 ug/mL at 12 hours after ingestion are often associated with  toxic reactions.  Performed at [facility], [address] [place], [place] [address]   cbc     Status: Abnormal   Collection Time: 04/30/20 11:55 AM  Result Value Ref Range   WBC 3.9 (L) 4.0 - 10.5 K/uL   RBC 3.83 (L) 4.22 - 5.81 MIL/uL   Hemoglobin 11.2 (L) 13.0 - 17.0 g/dL   HCT 35.1 (L) 39.0 - 52.0 %   MCV 91.6 80.0 - 100.0 fL   MCH 29.2 26.0 - 34.0 pg   MCHC 31.9 30.0 - 36.0 g/dL   RDW 14.4 11.5 - 15.5 %   Platelets 194 150 - 400 K/uL   nRBC 0.0 0.0 - 0.2 %    Comment: Performed at [facility], [address], [place], [place] [address]  Urine Drug Screen, Qualitative     Status: None   Collection Time: 04/30/20 11:55 AM  Result Value Ref Range   Tricyclic, Ur Screen NONE DETECTED NONE DETECTED   Amphetamines, Ur Screen NONE DETECTED NONE DETECTED   MDMA (Ecstasy)Ur Screen NONE DETECTED NONE DETECTED   Cocaine Metabolite,Ur [place] NONE DETECTED NONE DETECTED   Opiate, Ur Screen NONE DETECTED NONE DETECTED   Phencyclidine (PCP) Ur S NONE  DETECTED NONE DETECTED   Cannabinoid 50 Ng, Ur [place] NONE DETECTED NONE DETECTED   Barbiturates, Ur Screen NONE DETECTED NONE DETECTED   Benzodiazepine, Ur Scrn NONE DETECTED NONE DETECTED   Methadone Scn, Ur NONE DETECTED NONE DETECTED    Comment: (NOTE) Tricyclics + metabolites, urine    Cutoff 1000  ng/mL Amphetamines + metabolites, urine  Cutoff 1000 ng/mL MDMA (Ecstasy), urine              Cutoff 500 ng/mL Cocaine Metabolite, urine          Cutoff 300 ng/mL Opiate + metabolites, urine        Cutoff 300 ng/mL Phencyclidine (PCP), urine         Cutoff 25 ng/mL Cannabinoid, urine                 Cutoff 50 ng/mL Barbiturates + metabolites, urine  Cutoff 200 ng/mL Benzodiazepine, urine              Cutoff 200 ng/mL Methadone, urine                   Cutoff 300 ng/mL  The urine drug screen provides only a preliminary, unconfirmed analytical test result and should not be used for non-medical purposes. Clinical consideration and professional judgment should be applied to any positive drug screen result due to possible interfering substances. A more specific alternate chemical method must be used in order to obtain a confirmed analytical result. Gas chromatography / mass spectrometry (GC/MS) is the preferred confirm atory method. Performed at [facility], [address] [place], [place] [address]   Resp Panel by RT-PCR (Flu A&B, Covid) Nasopharyngeal Swab     Status: None   Collection Time: 04/30/20  1:59 PM   Specimen: Nasopharyngeal Swab; Nasopharyngeal(NP) swabs in vial transport medium  Result Value Ref Range   SARS Coronavirus 2 by RT PCR NEGATIVE NEGATIVE    Comment: (NOTE) SARS-CoV-2 target nucleic acids are NOT DETECTED.  The SARS-CoV-2 RNA is generally detectable in upper respiratory specimens during the acute phase of infection. The lowest concentration of SARS-CoV-2 viral copies this assay can detect is 138 copies/mL. A negative result does not preclude SARS-Cov-2 infection and should not be used as the sole basis for treatment or other patient management decisions. A negative result may occur with  improper specimen collection/handling, submission of specimen other than nasopharyngeal swab, presence of viral mutation(s) within the areas targeted by this  assay, and inadequate number of viral copies(<138 copies/mL). A negative result must be combined with clinical observations, patient history, and epidemiological information. The expected result is Negative.  Fact Sheet for Patients:  EntrepreneurPulse.com.au  Fact Sheet for Healthcare Providers:  IncredibleEmployment.be  This test is no t yet approved or cleared by the [place] FDA and  has been authorized for detection and/or diagnosis of SARS-CoV-2 by FDA under an Emergency Use Authorization (EUA). This EUA will remain  in effect (meaning this test can be used) for the duration of the COVID-19 declaration under Section 564(b)(1) of the Act, 21 U.S.C.section 360bbb-3(b)(1), unless the authorization is terminated  or revoked sooner.       Influenza A by PCR NEGATIVE NEGATIVE   Influenza B by PCR NEGATIVE NEGATIVE    Comment: (NOTE) The Xpert Xpress SARS-CoV-2/FLU/RSV plus assay is intended as an aid in the diagnosis of influenza from Nasopharyngeal swab specimens and should not be used as a sole basis for treatment. Nasal washings and aspirates are unacceptable  for Xpert Xpress SARS-CoV-2/FLU/RSV testing.  Fact Sheet for Patients: EntrepreneurPulse.com.au  Fact Sheet for Healthcare Providers: IncredibleEmployment.be  This test is not yet approved or cleared by the [place] FDA and has been authorized for detection and/or diagnosis of SARS-CoV-2 by FDA under an Emergency Use Authorization (EUA). This EUA will remain in effect (meaning this test can be used) for the duration of the COVID-19 declaration under Section 564(b)(1) of the Act, 21 U.S.C. section 360bbb-3(b)(1), unless the authorization is terminated or revoked.  Performed at [facility], [address] [place], [place] [address]   CBG monitoring, ED     Status: None   Collection Time: 05/01/20  5:18 AM  Result Value  Ref Range   Glucose-Capillary 91 70 - 99 mg/dL    Comment: Glucose reference range applies only to samples taken after fasting for at least 8 hours.  CBC with Differential/Platelet     Status: Abnormal   Collection Time: 05/01/20  5:53 AM  Result Value Ref Range   WBC 1.9 (L) 4.0 - 10.5 K/uL   RBC 4.28 4.22 - 5.81 MIL/uL   Hemoglobin 12.4 (L) 13.0 - 17.0 g/dL   HCT 39.9 39.0 - 52.0 %   MCV 93.2 80.0 - 100.0 fL   MCH 29.0 26.0 - 34.0 pg   MCHC 31.1 30.0 - 36.0 g/dL   RDW 14.4 11.5 - 15.5 %   Platelets 164 150 - 400 K/uL   nRBC 0.0 0.0 - 0.2 %   Neutrophils Relative % 37 %   Neutro Abs 0.7 (L) 1.7 - 7.7 K/uL   Lymphocytes Relative 52 %   Lymphs Abs 1.0 0.7 - 4.0 K/uL   Monocytes Relative 6 %   Monocytes Absolute 0.1 0.1 - 1.0 K/uL   Eosinophils Relative 3 %   Eosinophils Absolute 0.1 0.0 - 0.5 K/uL   Basophils Relative 1 %   Basophils Absolute 0.0 0.0 - 0.1 K/uL   WBC Morphology MORPHOLOGY UNREMARKABLE    RBC Morphology MORPHOLOGY UNREMARKABLE    Smear Review Normal platelet morphology    Immature Granulocytes 1 %   Abs Immature Granulocytes 0.01 0.00 - 0.07 K/uL    Comment: Performed at [facility], [address] [place], [place] [address]  Comprehensive metabolic panel     Status: None   Collection Time: 05/01/20  5:53 AM  Result Value Ref Range   Sodium 144 135 - 145 mmol/L   Potassium 3.8 3.5 - 5.1 mmol/L   Chloride 108 98 - 111 mmol/L   CO2 29 22 - 32 mmol/L   Glucose, Bld 95 70 - 99 mg/dL    Comment: Glucose reference range applies only to samples taken after fasting for at least 8 hours.   BUN 16 6 - 20 mg/dL   Creatinine, Ser 0.87 0.61 - 1.24 mg/dL   Calcium 9.5 8.9 - 10.3 mg/dL   Total Protein 7.5 6.5 - 8.1 g/dL   Albumin 3.6 3.5 - 5.0 g/dL   AST 22 15 - 41 U/L   ALT 16 0 - 44 U/L   Alkaline Phosphatase 56 38 - 126 U/L   Total Bilirubin 0.6 0.3 - 1.2 mg/dL   GFR, Estimated >60 >60 mL/min    Comment: (NOTE) Calculated using the CKD-EPI Creatinine  Equation (2021)    Anion gap 7 5 - 15    Comment: Performed at [facility], [address], [place], [place] [address]  Lactic acid, plasma     Status:  None   Collection Time: 05/01/20  5:53 AM  Result Value Ref Range   Lactic Acid, Venous 1.4 0.5 - 1.9 mmol/L    Comment: Performed at [facility], [address] [place], [place] [address]  Urinalysis, Routine w reflex microscopic Urine, Catheterized     Status: Abnormal   Collection Time: 05/01/20  5:53 AM  Result Value Ref Range   Color, Urine STRAW (A) YELLOW   APPearance CLEAR (A) CLEAR   Specific Gravity, Urine 1.005 1.005 - 1.030   pH 6.0 5.0 - 8.0   Glucose, UA NEGATIVE NEGATIVE mg/dL   Hgb urine dipstick NEGATIVE NEGATIVE   Bilirubin Urine NEGATIVE NEGATIVE   Ketones, ur NEGATIVE NEGATIVE mg/dL   Protein, ur NEGATIVE NEGATIVE mg/dL   Nitrite NEGATIVE NEGATIVE   Leukocytes,Ua NEGATIVE NEGATIVE   WBC, UA 0-5 0 - 5 WBC/hpf   Bacteria, UA NONE SEEN NONE SEEN   Squamous Epithelial / LPF NONE SEEN 0 - 5   Sperm, UA PRESENT     Comment: Performed at [facility], [place], [place] [address]  Procalcitonin - Baseline     Status: None   Collection Time: 05/01/20  5:53 AM  Result Value Ref Range   Procalcitonin <0.10 ng/mL    Comment:        Interpretation: PCT (Procalcitonin) <= 0.5 ng/mL: Systemic infection (sepsis) is not likely. Local bacterial infection is possible. (NOTE)       Sepsis PCT Algorithm           Lower Respiratory Tract                                      Infection PCT Algorithm    ----------------------------     ----------------------------         PCT < 0.25 ng/mL                PCT < 0.10 ng/mL          Strongly encourage             Strongly discourage   discontinuation of antibiotics    initiation of antibiotics    ----------------------------     -----------------------------       PCT 0.25 - 0.50 ng/mL            PCT 0.10 - 0.25 ng/mL                OR       >80% decrease in PCT            Discourage initiation of                                            antibiotics      Encourage discontinuation           of antibiotics    ----------------------------     -----------------------------         PCT >= 0.50 ng/mL              PCT 0.26 - 0.50 ng/mL               AND        <80% decrease in PCT  Encourage initiation of                                             antibiotics       Encourage continuation           of antibiotics    ----------------------------     -----------------------------        PCT >= 0.50 ng/mL                  PCT > 0.50 ng/mL               AND         increase in PCT                  Strongly encourage                                      initiation of antibiotics    Strongly encourage escalation           of antibiotics                                     -----------------------------                                           PCT <= 0.25 ng/mL                                                 OR                                        > 80% decrease in PCT                                      Discontinue / Do not initiate                                             antibiotics  Performed at [facility], [address] [place],  [address]   Pathologist smear review     Status: None   Collection Time: 05/01/20  5:53 AM  Result Value Ref Range   Path Review Blood smear is reviewed.     Comment: Leukopenia, with absolute neutropenia, and ANC of 700 per microliter. No significant left shift in maturation, and no circulating blasts. Normocytic anemia, without anisopoikilocytosis. Low normal platelet count, with unremarkable morphology. The cause of the patient's leukopenia is unclear from morphologic review alone. Potential causes of leukopenia may include nutritional deficiencies, medication/toxin exposure (including clozapine), viral illness, autoimmune/rheumatologic disease,  and/or an evolving myeloid disorder. Close clinical followup is recommended. Reviewed by Kathi Simpers,  M.D. Performed at [facility], [address] [place], [place] [address]   Culture, blood (Routine X 2) w Reflex to ID Panel     Status: None (Preliminary result)   Collection Time: 05/01/20  5:54 AM   Specimen: BLOOD  Result Value Ref Range   Specimen Description BLOOD RIGHT ANTECUBITAL    Special Requests      BOTTLES DRAWN AEROBIC AND ANAEROBIC Blood Culture adequate volume   Culture      NO GROWTH <12 HOURS Performed at [facility], [address] [place], [place] [address]    Report Status PENDING   Blood gas, arterial     Status: Abnormal   Collection Time: 05/01/20  7:56 AM  Result Value Ref Range   FIO2 0.40    Delivery systems NASAL CANNULA    pH, Arterial 7.50 (H) 7.350 - 7.450   pCO2 arterial 35 32.0 - 48.0 mmHg   pO2, Arterial 69 (L) 83.0 - 108.0 mmHg   Bicarbonate 28.5 (H) 20.0 - 28.0 mmol/L   Acid-Base Excess 3.9 (H) 0.0 - 2.0 mmol/L   O2 Saturation 97.3 %   Patient temperature 32.8    Collection site RIGHT RADIAL    Sample type ARTERIAL DRAW    Allens test (pass/fail) PASS PASS    Comment: Performed at [facility], [address] [place], [place] [address]  Ammonia     Status: None   Collection Time: 05/01/20  7:56 AM  Result Value Ref Range   Ammonia 28 9 - 35 umol/L    Comment: Performed at [facility], [place], [place] [address]  Cortisol, Random     Status: None   Collection Time: 05/01/20  7:57 AM  Result Value Ref Range   Cortisol, Plasma 13.5 ug/dL    Comment: (NOTE) AM    6.7 - 22.6 ug/dL PM   <10.0       ug/dL Performed at [facility] [address], [place], [place] [address]   TSH     Status: None   Collection Time: 05/01/20  7:58 AM  Result Value Ref Range   TSH 0.864 0.350 - 4.500 uIU/mL    Comment: Performed by a 3rd Generation assay with a functional sensitivity  of <=0.01 uIU/mL. Performed at [facility], [address] [place], [place] [address]   Valproic acid level     Status: Abnormal   Collection Time: 05/01/20  7:58 AM  Result Value Ref Range   Valproic Acid Lvl 20 (L) 50.0 - 100.0 ug/mL    Comment: Performed at [facility], [place],  [address]  HIV Antibody (routine testing w rflx)     Status: None   Collection Time: 05/01/20 10:56 AM  Result Value Ref Range   HIV Screen 4th Generation wRfx Non Reactive Non Reactive    Comment: Performed at [facility], [place] [address] [place], [place] [address]  CBC     Status: Abnormal   Collection Time: 05/01/20 10:56 AM  Result Value Ref Range   WBC 3.9 (L) 4.0 - 10.5 K/uL   RBC 3.62 (L) 4.22 - 5.81 MIL/uL   Hemoglobin 10.5 (L) 13.0 - 17.0 g/dL   HCT 33.0 (L) 39.0 - 52.0 %   MCV 91.2 80.0 - 100.0 fL   MCH 29.0 26.0 - 34.0 pg   MCHC 31.8 30.0 - 36.0 g/dL   RDW 14.3 11.5 - 15.5 %  Platelets 141 (L) 150 - 400 K/uL   nRBC 0.0 0.0 - 0.2 %    Comment: Performed at [facility], [address] [place], [place] [address]  Creatinine, serum     Status: None   Collection Time: 05/01/20 10:56 AM  Result Value Ref Range   Creatinine, Ser 0.74 0.61 - 1.24 mg/dL   GFR, Estimated >60 >60 mL/min    Comment: (NOTE) Calculated using the CKD-EPI Creatinine Equation (2021) Performed at [facility], [place], [place] [address]   Troponin I (High Sensitivity)     Status: None   Collection Time: 05/01/20 10:56 AM  Result Value Ref Range   Troponin I (High Sensitivity) 3 <18 ng/L    Comment: (NOTE) Elevated high sensitivity troponin I (hsTnI) values and significant  changes across serial measurements may suggest ACS but many other  chronic and acute conditions are known to elevate hsTnI results.  Refer to the "Links" section for chest pain algorithms and additional  guidance. Performed at [facility], [address] [place], [place] [address]   Brain natriuretic peptide     Status: None   Collection Time: 05/01/20 10:56 AM  Result Value Ref Range   B Natriuretic Peptide 78.5 0.0 - 100.0 pg/mL    Comment: Performed at [facility], [address], [place], [place] [address]  .Cooxemetry Panel (carboxy, met, total hgb, O2 sat)     Status: None   Collection Time: 05/01/20 10:58 AM  Result Value Ref Range   O2 Saturation 97.7 %   Carboxyhemoglobin 1.4 0.5 - 1.5 %   Methemoglobin 1.0 0.0 - 1.5 %   Total oxygen content 96.3 mL/dL    Comment: Performed at [facility], [address] [place], [place] [address]  D-dimer, quantitative     Status: None   Collection Time: 05/01/20  2:32 PM  Result Value Ref Range   D-Dimer, Quant 0.37 0.00 - 0.50 ug/mL-FEU    Comment: (NOTE) At the manufacturer cut-off value of 0.5 g/mL FEU, this assay has a negative predictive value of 95-100%.This assay is intended for use in conjunction with a clinical pretest probability (PTP) assessment model to exclude pulmonary embolism (PE) and deep venous thrombosis (DVT) in outpatients suspected of PE or DVT. Results should be correlated with clinical presentation. Performed at [facility], [address] [place], [place] [address]     Current Facility-Administered Medications  Medication Dose Route Frequency Provider Last Rate Last Admin  . albuterol (VENTOLIN HFA) 108 (90 Base) MCG/ACT inhaler 2 puff  2 puff Inhalation Q4H PRN Ivor Costa, MD      . benztropine (COGENTIN) tablet 0.5 mg  0.5 mg Oral BID Ivor Costa, MD   0.5 mg at 05/01/20 1510  . ceFEPIme (MAXIPIME) 2 g in sodium chloride 0.9 % 100 mL IVPB  2 g Intravenous Q8H Lu Duffel, [facility]   Stopped at 05/01/20 1511  . cloZAPine (CLOZARIL) tablet 50 mg  50 mg Oral QHS Bayli Quesinberry T, MD      . dextrose 5 % in lactated ringers infusion   Intravenous Continuous Tyler Pita, MD 100 mL/hr at 05/01/20 1106 New Bag  at 05/01/20 1106  . divalproex (DEPAKOTE) DR tablet 500 mg  500 mg Oral TID Ivor Costa, MD   500 mg at 05/01/20 1510  . docusate sodium (COLACE) capsule 100 mg  100 mg Oral BID PRN Tyler Pita, MD      .  enoxaparin (LOVENOX) injection 40 mg  40 mg Subcutaneous Q24H Tyler Pita, MD      . feeding supplement (ENSURE ENLIVE / ENSURE PLUS) liquid 237 mL  237 mL Oral BID BM Ivor Costa, MD      . Derrill Memo ON 05/02/2020] ferrous sulfate tablet 325 mg  325 mg Oral Q breakfast Ivor Costa, MD      . food thickener (THICK IT) powder   Oral PRN Ivor Costa, MD      . haloperidol (HALDOL) tablet 5 mg  5 mg Oral BID Kalea Perine T, MD      . melatonin tablet 3 mg  3 mg Oral QHS PRN Ivor Costa, MD      . midodrine (PROAMATINE) tablet 10 mg  10 mg Oral TID Renella Cunas, MD   10 mg at 05/01/20 1709  . ondansetron (ZOFRAN) injection 4 mg  4 mg Intravenous Q6H PRN Tyler Pita, MD      . polyethylene glycol (MIRALAX / GLYCOLAX) packet 17 g  17 g Oral Daily PRN Tyler Pita, MD      . thiamine 577m in normal saline (556m IVPB  500 mg Intravenous Daily GoTyler PitaMD   Stopped at 05/01/20 1438  . vancomycin (VANCOCIN) IVPB 1000 mg/200 mL premix  1,000 mg Intravenous Q12H [facility]Lu [facility] Stopped at 05/01/20 1815    Musculoskeletal: Strength & Muscle Tone: within normal limits Gait & Station: normal Patient leans: N/A            Psychiatric Specialty Exam:  Presentation  General Appearance: No data recorded Eye Contact:No data recorded Speech:No data recorded Speech Volume:No data recorded Handedness:No data recorded  Mood and Affect  Mood:No data recorded Affect:No data recorded  Thought Process  Thought Processes:No data recorded Descriptions of Associations:No data recorded Orientation:No data recorded Thought Content:No data recorded History of Schizophrenia/Schizoaffective disorder:No data recorded Duration of Psychotic Symptoms:No data  recorded Hallucinations:No data recorded Ideas of Reference:No data recorded Suicidal Thoughts:No data recorded Homicidal Thoughts:No data recorded  Sensorium  Memory:No data recorded Judgment:No data recorded Insight:No data recorded  Executive Functions  Concentration:No data recorded Attention Span:No data recorded Recall:No data recorded Fund of Knowledge:No data recorded Language:No data recorded  Psychomotor Activity  Psychomotor Activity:No data recorded  Assets  Assets:No data recorded  Sleep  Sleep:No data recorded  Physical Exam: Physical Exam Vitals and nursing note reviewed.  Constitutional:      Appearance: Normal appearance.  HENT:     Head: Normocephalic and atraumatic.     Mouth/Throat:     Pharynx: Oropharynx is clear.  Eyes:     Pupils: Pupils are equal, round, and reactive to light.  Cardiovascular:     Rate and Rhythm: Normal rate and regular rhythm.  Pulmonary:     Effort: Pulmonary effort is normal.     Breath sounds: Normal breath sounds.  Abdominal:     General: Abdomen is flat.     Palpations: Abdomen is soft.  Musculoskeletal:        General: Normal range of motion.  Skin:    General: Skin is warm and dry.  Neurological:     General: No focal deficit present.     Mental Status: Mental status is at baseline.  Psychiatric:        Attention and Perception: He is inattentive.        Mood and Affect: Affect is blunt.        Speech:  Speech is tangential.        Behavior: Behavior is agitated. Behavior is not aggressive.        Thought Content: Thought content does not include homicidal or suicidal ideation.        Cognition and Memory: Cognition is impaired. Memory is impaired.        Judgment: Judgment is impulsive.    Review of Systems  Constitutional: Negative.   HENT: Negative.   Eyes: Negative.   Respiratory: Negative.   Cardiovascular: Negative.   Gastrointestinal: Negative.   Musculoskeletal: Negative.   Skin: Negative.    Neurological: Negative.   Psychiatric/Behavioral: Negative.    Blood pressure 135/61, pulse 70, temperature (!) 97.3 F (36.3 C), resp. rate (!) 23, SpO2 99 %. There is no height or weight on file to calculate BMI.  Treatment Plan Summary: Medication management and Plan See note above.  I put some of this information up in the past psychiatric history.  Restarted low-dose closet pain as well as Haldol.  Patient is not appropriate for admission to psychiatric ward.  Liable to need placement after stabilization.  Plan would be if tolerated which I expect it probably will be to gradually increase his clozapine.  Recontact psychiatry if needed in the hospital.  Disposition: Patient does not meet criteria for psychiatric inpatient admission. Supportive therapy provided about ongoing stressors.  Alethia Berthold, MD 05/01/2020 6:51 PM

## 2020-05-01 NOTE — ED Notes (Signed)
Pt found to have ripped IV's out, removed all cardiac leads and pulse ox and BP cuff. Removed all clothes. Urinated into brief and threw brief in floor. Removed all dirty things from pt and placed in clean brief and clean hospital gown.

## 2020-05-01 NOTE — ED Notes (Signed)
Per 2A Health and safety inspector, pt is going to floor at 6pm. ER Charge RN aware.

## 2020-05-01 NOTE — ED Notes (Signed)
While rounding RN found pt sitting on edge of bed. When asked, "are you okay" pt stated "yes" but then started swaying and leaning forward. RN to bedside assisting Pt to lay back in bed. RN unable to understand pts speech but pt following commands. Vitals assessed and Dr. Leonides Schanz made aware of temperature

## 2020-05-01 NOTE — ED Notes (Signed)
Pt transported to CT ?

## 2020-05-01 NOTE — ED Notes (Signed)
Pt monitor alarming again for low SpO2, This RN to the bedside and pts SpO2 noted noted to be 75% on room air. Pt was placed on 3 Liters of O2 with improvement of sats, however pt started to desat again. Pt O2 increased to 5 liters via [place]. EDP aware and order for Narcan placed.

## 2020-05-01 NOTE — ED Notes (Addendum)
Pt having apneic episodes while sleeping. Oxygen dropped to 59% with a good waveform on RA. Pt woke by RN and oxygen saturation rose to 98%. MD made aware.

## 2020-05-01 NOTE — Consult Note (Addendum)
Pharmacy Antibiotic Note  Austin Gates is a 60 y.o. male admitted on 04/30/2020 with sepsis without clear source of infection.    Pharmacy has been consulted for VANCOMYCIN/CEFEPIME dosing.  Plan: Will start Cefepime 2g q8h  Will start  Vancomycin 1000 mg IV Q 12 hrs. Goal AUC 400-550. Expected AUC: 450 SCr used: 0.8       Temp (24hrs), Avg:93.3 F (34.1 C), Min:90.7 F (32.6 C), Max:98.3 F (36.8 C)  Recent Labs  Lab 04/29/20 1711 04/30/20 1151 04/30/20 1155 05/01/20 0553 05/01/20 1056  WBC 3.8* 3.9* 3.9* 1.9* 3.9*  CREATININE 0.83  --  0.85 0.87 0.74  LATICACIDVEN  --   --   --  1.4  --     Estimated Creatinine Clearance: 100.9 mL/min (by C-G formula based on SCr of 0.74 mg/dL).    No Known Allergies  Antimicrobials this admission: Cefepime 3/11 >> Vancomcyin 3/11 >>  Dose adjustments this admission: None   Microbiology results: 3/11 BCx: pending 3/11 UCx: pending  COVID/FLU NEG  Thank you for allowing pharmacy to be a part of this patient's care.  Lu Duffel, PharmD, BCPS Clinical Pharmacist 05/01/2020 2:22 PM
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
## 2020-05-01 NOTE — ED Provider Notes (Addendum)
Emergency Medicine Observation Re-evaluation Note  Herbert Marken is a 60 y.o. male, seen on rounds today.  Pt initially presented to the ED for complaints of medical evaluation Currently, the patient is resting comfortably.  Physical Exam  BP 113/79 (BP Location: Left Arm)   Pulse 73   Temp (!) 97.5 F (36.4 C) (Oral)   Resp 18   SpO2 100%  Physical Exam Gen: No acute distress  Resp: Normal rise and fall of chest Neuro: Moving all four extremities Psych: Resting currently, calm and cooperative when awake    ED Course / MDM  EKG:    I have reviewed the labs performed to date as well as medications administered while in observation.  Recent changes in the last 24 hours include no acute events overnight.  Plan  Current plan is for psychiatric inpatient treatment. Patient is under full IVC at this time.   Makela Niehoff, Delice Bison, DO 05/01/20 0405   5:30 AM  Pt seen by nursing staff sitting up on the side of the bed, swaying.  Seemed unsteady so she went to evaluate him and he seemed slow to respond.  He did receive sedation yesterday.  Rectal temp was found to be 91.  Confirmed with a separate thermometer.  Blood glucose normal.  Otherwise vital signs normal.  He denies any pain at this time.  He can answer questions but does so slowly with slightly slurred speech.  Unclear what his baseline is.  He received Benadryl, Ativan and oral Zyprexa yesterday between 3 and 4 PM.  ?possible sedation related.  He is moving his extremities equally.  No facial asymmetry.  Will obtain labs, urine, cultures, chest x-ray, CT of the head.  Will give warm IV fluids and placed under a Bair hugger.  We will continue to monitor closely the patient may need medical admission.  No other signs of sepsis currently.  Today's Vitals   04/30/20 1420 04/30/20 2140 05/01/20 0515 05/01/20 0521  BP:  113/79 (!) 118/93   Pulse:  73 60   Resp:  18 16   Temp:   (!) 90.7 F (32.6 C) (!) 91 F (32.8 C)  TempSrc:    Rectal Rectal  SpO2:  100% 100%   PainSc: 0-No pain  0-No pain    There is no height or weight on file to calculate BMI.  On my reexamination, patient's heart sounds are normal.  Lungs are clear to auscultation.  Abdomen soft and nontender.  Moving all of his extremities equally.  Slightly slurred speech.  No facial asymmetry.  No aphasia.  Slow to answer questions but oriented x3.  7:50 AM  Pt having brief episodes of apnea with desaturation.  He is currently on 5 L nasal cannula.  Will obtain ABG for further evaluation.  His chest x-ray is clear.  Given Narcan with no improvement.  CT head shows no acute abnormality.  Labs show worsening leukopenia which may be secondary to Clozaril but given this finding with hypothermia, receiving broad-spectrum antibiotics for possible sepsis.  Procalcitonin is normal.  Lactic normal.  Urine shows no sign of infection.  Previous drug screen was negative.  Depakote level previously was 30.  We will recheck.  We will also obtain ammonia, TSH level.  Discussed with hospitalist Dr. Blaine Hamper who would like for [place] to obtain ABG and repeat rectal temperature prior to hospitalist admission.  He is concerned that the patient may need ICU admission.  8:12 AM  D/w Dr. Patsey Berthold with critical  care.  They will see patient in consultation to determine if he needs ICU admission.  She recommends obtaining a random cortisol level which has already been ordered by the hospitalist.  Patient's blood gas shows normal PCO2 but PO2 is low on 5 L.  We will continue to closely monitor patient.   Date: 05/01/2020 8:06 AM  Rate: 71  Rhythm: normal sinus rhythm  QRS Axis: normal  Intervals: normal  ST/T Wave abnormalities: normal  Conduction Disutrbances: none  Narrative Interpretation: unremarkable  CRITICAL CARE Performed by: Pryor Curia   Total critical care time: 55 minutes  Critical care time was exclusive of separately billable procedures and treating other  patients.  Critical care was necessary to treat or prevent imminent or life-threatening deterioration.  Critical care was time spent personally by me on the following activities: development of treatment plan with patient and/or surrogate as well as nursing, discussions with consultants, evaluation of patient's response to treatment, examination of patient, obtaining history from patient or surrogate, ordering and performing treatments and interventions, ordering and review of laboratory studies, ordering and review of radiographic studies, pulse oximetry and re-evaluation of patient's condition.      Leontine Radman, Delice Bison, DO 05/01/20 [phone]

## 2020-05-01 NOTE — ED Notes (Signed)
Dr.[name] at bedside  
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
## 2020-05-01 NOTE — ED Notes (Signed)
Pt had ripped all cardiac leads, BP cuff, and pulse ox off. Ripped L wrist IV out. IVF changed to RAC at this time. Pt informed not to remove any medical equipment. Pt verbalized understanding but repeating "I want to go home." informed pt that he is still too sick to go home but maybe in a few days he can go home. Pt verbalized hunger, informed he can have something to drink. Verbalized understanding.

## 2020-05-01 NOTE — ED Notes (Signed)
Pt resting in room with lights dimmed and door open. NAD noted. Equal and unlabored breathing noted.

## 2020-05-01 NOTE — ED Notes (Signed)
Dr. Gonzalez at bedside

## 2020-05-01 NOTE — BH Assessment (Signed)
Referral information for Psychiatric Hospitalization faxed to;    [facility]([phone])  Left a voicemail   [phone])   [place] [phone])

## 2020-05-01 NOTE — ED Notes (Signed)
Provided warm blankets to pt at this time.

## 2020-05-01 NOTE — ED Notes (Signed)
Resumed care from [facility]. Pt snoring at this time. Does not wake while moving pt. Hooked up to cardiac monitor. On 3L [place] at this time.

## 2020-05-01 NOTE — ED Notes (Signed)
Pt Spo2 decreased to 3 liters via [place]

## 2020-05-01 NOTE — ED Notes (Deleted)
Rescinded IVC by Dr. Weber Cooks  [phone]

## 2020-05-01 NOTE — ED Notes (Signed)
Pt remains in CT at this time.  

## 2020-05-01 NOTE — ED Notes (Signed)
Pt has been given Narcan without response. EDP ward at bedside.

## 2020-05-01 NOTE — Consult Note (Signed)
  Brief follow-up: I had not seen that an absolute neutrophil count had been obtained and was 0.7.  Unfortunate as the patient really needs clozapine to do well.  Appreciate input from pharmacy.  Hold clozapine and we will check daily ANC levels.

## 2020-05-02 ENCOUNTER — Inpatient Hospital Stay: Payer: Medicaid Other

## 2020-05-02 DIAGNOSIS — F71 Moderate intellectual disabilities: Secondary | ICD-10-CM

## 2020-05-02 LAB — CBC WITH DIFFERENTIAL/PLATELET
Abs Immature Granulocytes: 0.02 10*3/uL (ref 0.00–0.07)
Basophils Absolute: 0 10*3/uL (ref 0.0–0.1)
Basophils Relative: 1 %
Eosinophils Absolute: 0 10*3/uL (ref 0.0–0.5)
Eosinophils Relative: 1 %
HCT: 34.1 % — ABNORMAL LOW (ref 39.0–52.0)
Hemoglobin: 10.9 g/dL — ABNORMAL LOW (ref 13.0–17.0)
Immature Granulocytes: 1 %
Lymphocytes Relative: 16 %
Lymphs Abs: 0.6 10*3/uL — ABNORMAL LOW (ref 0.7–4.0)
MCH: 29.3 pg (ref 26.0–34.0)
MCHC: 32 g/dL (ref 30.0–36.0)
MCV: 91.7 fL (ref 80.0–100.0)
Monocytes Absolute: 0.2 10*3/uL (ref 0.1–1.0)
Monocytes Relative: 6 %
Neutro Abs: 2.9 10*3/uL (ref 1.7–7.7)
Neutrophils Relative %: 75 %
Platelets: 143 10*3/uL — ABNORMAL LOW (ref 150–400)
RBC: 3.72 MIL/uL — ABNORMAL LOW (ref 4.22–5.81)
RDW: 14.1 % (ref 11.5–15.5)
WBC: 3.8 10*3/uL — ABNORMAL LOW (ref 4.0–10.5)
nRBC: 0 % (ref 0.0–0.2)

## 2020-05-02 LAB — BASIC METABOLIC PANEL
Anion gap: 5 (ref 5–15)
BUN: 12 mg/dL (ref 6–20)
CO2: 30 mmol/L (ref 22–32)
Calcium: 8.8 mg/dL — ABNORMAL LOW (ref 8.9–10.3)
Chloride: 109 mmol/L (ref 98–111)
Creatinine, Ser: 0.78 mg/dL (ref 0.61–1.24)
GFR, Estimated: >60 mL/min (ref >60)
Glucose, Bld: 83 mg/dL (ref 70–99)
Potassium: 3.5 mmol/L (ref 3.5–5.1)
Sodium: 144 mmol/L (ref 135–145)

## 2020-05-02 LAB — BLOOD GAS, ARTERIAL
Acid-Base Excess: 7.1 mmol/L — ABNORMAL HIGH (ref 0.0–2.0)
Bicarbonate: 32 mmol/L — ABNORMAL HIGH (ref 20.0–28.0)
FIO2: 0.21
O2 Saturation: 97.4 %
Patient temperature: 37
pCO2 arterial: 46 mmHg (ref 32.0–48.0)
pH, Arterial: 7.45 (ref 7.350–7.450)
pO2, Arterial: 91 mmHg (ref 83.0–108.0)

## 2020-05-02 LAB — COOXEMETRY PANEL
Carboxyhemoglobin: 1.7 % — ABNORMAL HIGH (ref 0.5–1.5)
Methemoglobin: 0.9 % (ref 0.0–1.5)
O2 Saturation: 97.8 %
Total oxygen content: 96.9 mL/dL

## 2020-05-02 LAB — URINE CULTURE: Culture: NO GROWTH

## 2020-05-02 LAB — PHOSPHORUS: Phosphorus: 3.5 mg/dL (ref 2.5–4.6)

## 2020-05-02 LAB — PROLACTIN: Prolactin: 11.8 ng/mL (ref 4.0–15.2)

## 2020-05-02 LAB — MAGNESIUM: Magnesium: 1.9 mg/dL (ref 1.7–2.4)

## 2020-05-02 NOTE — Consult Note (Signed)
[facility] Face-to-Face Psychiatry Consult   Reason for Consult:  follow-up with 60 year old man with chronic severe mental health problems who is being admitted to medicine Referring Physician:  Dr. 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 male patient admitted with with agitation and difficulty controlling his behavior.  In ER patient was observed to have oxygen desaturations and pancytopenia and low ANC.  He is being admitted to the medical service.  Patient seen today for psych follow-op. Chart reviewed. Patient`s vitals seem to be stable; pO2 100% room air. WBC improved from 1.9 to 3.9 today; ANC improved from 0.7 to 2.9 today.  Per nurse, patient had some episodes of agitation last night and this morning, although he did not express aggression and was redirectable.  Patient reports "doing fair", smiles, shakes hand and does "fist bump". He asks about when he can go home. Reports good mood, denies feeling depressed, anxious, suicidal homicidal, denies hallucinations. Patient is familiar to me from his admission to the inpatient psychiatric ward and appears at his mental baseline at the time of this assessment.  Risk to Self:   Risk to Others:   Prior Inpatient Therapy:   Prior Outpatient Therapy:    Past Medical History:  Past Medical History:  Diagnosis Date  . Hypertension   . Intellectual disability   . Obsessive-compulsive disorder   . Schizo-affective  schizophrenia [facility])     Past Surgical History:  Procedure Laterality Date  . COLONOSCOPY WITH PROPOFOL N/A 12/28/2017   Procedure: COLONOSCOPY WITH PROPOFOL;  Surgeon: Jonathon Bellows, MD;  Location: [facility] ENDOSCOPY;  Service: Gastroenterology;  Laterality: N/A;  . HYDROCELE EXCISION Bilateral 02/22/2018   Procedure: bilateral HYDROCELECTOMY ADULT;  Surgeon: Cleon Gustin, MD;  Location: [place];  Service: Urology;  Laterality: Bilateral;  . INSERTION OF ILIAC STENT Left 02/22/2018   Procedure: INSERTION LEFT SUPERIOR FEMORAL ARTERY USING 6MM X 5CM VIABHON STENT with mynx device closure on right femoral artery;  Surgeon: Waynetta Sandy, MD;  Location: [place];  Service: Vascular;  Laterality: Left;  . KNEE ARTHROSCOPY    . SCROTAL EXPLORATION N/A 02/22/2018   Procedure: SCROTUM EXPLORATION;  Surgeon: Cleon Gustin, MD;  Location: [place];  Service: Urology;  Laterality: N/A;  . UPPER EXTREMITY ANGIOGRAM Left 02/22/2018   Procedure: left lower EXTREMITY ANGIOGRAM;  Surgeon: Waynetta Sandy, MD;  Location: [facility] OR;  Service: Vascular;  Laterality: Left;   Family History:  Family History  Problem Relation Age of Onset  . Diabetes Mother    Family Psychiatric  History:  Social History:  Social History   Substance and Sexual Activity  Alcohol Use No     Social History   Substance and Sexual Activity  Drug Use No    Social History   Socioeconomic History  . Marital status: Single    Spouse name: Not on file  . Number of children: Not on file  . Years of education: Not on file  . Highest education level: Not on file  Occupational  History  . Occupation: Disabled  Tobacco Use  . Smoking status: Former Smoker    Types: Cigarettes  . Smokeless tobacco: Never Used  Vaping Use  . Vaping Use: Unknown  Substance and Sexual Activity  . Alcohol use: No  . Drug use: No  . Sexual activity: Never  Other Topics Concern  . Not on file  Social History Narrative   ** Merged  History Encounter **       Social Determinants of Health   Financial Resource Strain: Not on file  Food Insecurity: Not on file  Transportation Needs: Not on file  Physical Activity: Not on file  Stress: Not on file  Social Connections: Not on file   Additional Social History:    Allergies:  No Known Allergies  Labs:  Results for orders placed or performed during the hospital encounter of 04/30/20 (from the past 48 hour(s))  Valproic acid level     Status: Abnormal   Collection Time: 04/30/20 11:51 AM  Result Value Ref Range   Valproic Acid Lvl 30 (L) 50.0 - 100.0 ug/mL    Comment: Performed at [facility], [address] [place], [place] [address]  CBC with Differential/Platelet     Status: Abnormal   Collection Time: 04/30/20 11:51 AM  Result Value Ref Range   WBC 3.9 (L) 4.0 - 10.5 K/uL   RBC 3.80 (L) 4.22 - 5.81 MIL/uL   Hemoglobin 10.9 (L) 13.0 - 17.0 g/dL   HCT 35.5 (L) 39.0 - 52.0 %   MCV 93.4 80.0 - 100.0 fL   MCH 28.7 26.0 - 34.0 pg   MCHC 30.7 30.0 - 36.0 g/dL   RDW 14.5 11.5 - 15.5 %   Platelets 219 150 - 400 K/uL   nRBC 0.0 0.0 - 0.2 %   Neutrophils Relative % 61 %   Neutro Abs 2.4 1.7 - 7.7 K/uL   Lymphocytes Relative 25 %   Lymphs Abs 1.0 0.7 - 4.0 K/uL   Monocytes Relative 12 %   Monocytes Absolute 0.5 0.1 - 1.0 K/uL   Eosinophils Relative 1 %   Eosinophils Absolute 0.0 0.0 - 0.5 K/uL   Basophils Relative 1 %   Basophils Absolute 0.0 0.0 - 0.1 K/uL   Immature Granulocytes 0 %   Abs Immature Granulocytes 0.00 0.00 - 0.07 K/uL    Comment: Performed at [facility], [address] [place], [place] [address]  Comprehensive metabolic panel     Status: Abnormal   Collection Time: 04/30/20 11:55 AM  Result Value Ref Range   Sodium 139 135 - 145 mmol/L   Potassium 4.2 3.5 - 5.1 mmol/L   Chloride 105 98 - 111 mmol/L   CO2 29 22 - 32 mmol/L   Glucose, Bld 75 70 - 99 mg/dL    Comment: Glucose reference range applies only to samples  taken after fasting for at least 8 hours.   BUN 22 (H) 6 - 20 mg/dL   Creatinine, Ser 0.85 0.61 - 1.24 mg/dL   Calcium 9.1 8.9 - 10.3 mg/dL   Total Protein 7.4 6.5 - 8.1 g/dL   Albumin 3.6 3.5 - 5.0 g/dL   AST 18 15 - 41 U/L   ALT 14 0 - 44 U/L   Alkaline Phosphatase 57 38 - 126 U/L   Total Bilirubin 0.4 0.3 - 1.2 mg/dL   GFR, Estimated >60 >60 mL/min    Comment: (NOTE) Calculated using the CKD-EPI Creatinine Equation (2021)    Anion  gap 5 5 - 15    Comment: Performed at [facility], [address] [place], [place] [address]  Ethanol     Status: None   Collection Time: 04/30/20 11:55 AM  Result Value Ref Range   Alcohol, Ethyl (B) <10 <10 mg/dL    Comment: (NOTE) Lowest detectable limit for serum alcohol is 10 mg/dL.  For medical purposes only. Performed at [facility], [address] [place], [place] [address]   Salicylate level     Status: Abnormal   Collection Time: 04/30/20 11:55 AM  Result Value Ref Range   Salicylate Lvl <6.1 (L) 7.0 - 30.0 mg/dL    Comment: Performed at [facility], [place], [place] [address]  Acetaminophen level     Status: Abnormal   Collection Time: 04/30/20 11:55 AM  Result Value Ref Range   Acetaminophen (Tylenol), Serum <10 (L) 10 - 30 ug/mL    Comment: (NOTE) Therapeutic concentrations vary significantly. A range of 10-30 ug/mL  may be an effective concentration for many patients. However, some  are best treated at concentrations outside of this range. Acetaminophen concentrations >150 ug/mL at 4 hours after ingestion  and >50 ug/mL at 12 hours after ingestion are often associated with  toxic reactions.  Performed at [facility], [address] [place], [place] [address]   cbc     Status: Abnormal   Collection Time: 04/30/20 11:55 AM  Result Value Ref Range   WBC 3.9 (L) 4.0 - 10.5 K/uL   RBC 3.83 (L) 4.22 - 5.81 MIL/uL   Hemoglobin 11.2 (L) 13.0 - 17.0 g/dL   HCT 35.1  (L) 39.0 - 52.0 %   MCV 91.6 80.0 - 100.0 fL   MCH 29.2 26.0 - 34.0 pg   MCHC 31.9 30.0 - 36.0 g/dL   RDW 14.4 11.5 - 15.5 %   Platelets 194 150 - 400 K/uL   nRBC 0.0 0.0 - 0.2 %    Comment: Performed at [facility], [address], [place],  [address]  Urine Drug Screen, Qualitative     Status: None   Collection Time: 04/30/20 11:55 AM  Result Value Ref Range   Tricyclic, Ur Screen NONE DETECTED NONE DETECTED   Amphetamines, Ur Screen NONE DETECTED NONE DETECTED   MDMA (Ecstasy)Ur Screen NONE DETECTED NONE DETECTED   Cocaine Metabolite,Ur [place] NONE DETECTED NONE DETECTED   Opiate, Ur Screen NONE DETECTED NONE DETECTED   Phencyclidine (PCP) Ur S NONE DETECTED NONE DETECTED   Cannabinoid 50 Ng, Ur [place] NONE DETECTED NONE DETECTED   Barbiturates, Ur Screen NONE DETECTED NONE DETECTED   Benzodiazepine, Ur Scrn NONE DETECTED NONE DETECTED   Methadone Scn, Ur NONE DETECTED NONE DETECTED    Comment: (NOTE) Tricyclics + metabolites, urine    Cutoff 1000 ng/mL Amphetamines + metabolites, urine  Cutoff 1000 ng/mL MDMA (Ecstasy), urine              Cutoff 500 ng/mL Cocaine Metabolite, urine          Cutoff 300 ng/mL Opiate + metabolites, urine        Cutoff 300 ng/mL Phencyclidine (PCP), urine         Cutoff 25 ng/mL Cannabinoid, urine                 Cutoff 50 ng/mL Barbiturates + metabolites, urine  Cutoff 200 ng/mL Benzodiazepine, urine  Cutoff 200 ng/mL Methadone, urine                   Cutoff 300 ng/mL  The urine drug screen provides only a preliminary, unconfirmed analytical test result and should not be used for non-medical purposes. Clinical consideration and professional judgment should be applied to any positive drug screen result due to possible interfering substances. A more specific alternate chemical method must be used in order to obtain a confirmed analytical result. Gas chromatography / mass spectrometry (GC/MS) is the preferred confirm atory  method. Performed at [facility], [address] [place], [place] [address]   Resp Panel by RT-PCR (Flu A&B, Covid) Nasopharyngeal Swab     Status: None   Collection Time: 04/30/20  1:59 PM   Specimen: Nasopharyngeal Swab; Nasopharyngeal(NP) swabs in vial transport medium  Result Value Ref Range   SARS Coronavirus 2 by RT PCR NEGATIVE NEGATIVE    Comment: (NOTE) SARS-CoV-2 target nucleic acids are NOT DETECTED.  The SARS-CoV-2 RNA is generally detectable in upper respiratory specimens during the acute phase of infection. The lowest concentration of SARS-CoV-2 viral copies this assay can detect is 138 copies/mL. A negative result does not preclude SARS-Cov-2 infection and should not be used as the sole basis for treatment or other patient management decisions. A negative result may occur with  improper specimen collection/handling, submission of specimen other than nasopharyngeal swab, presence of viral mutation(s) within the areas targeted by this assay, and inadequate number of viral copies(<138 copies/mL). A negative result must be combined with clinical observations, patient history, and epidemiological information. The expected result is Negative.  Fact Sheet for Patients:  EntrepreneurPulse.com.au  Fact Sheet for Healthcare Providers:  IncredibleEmployment.be  This test is no t yet approved or cleared by the [place] FDA and  has been authorized for detection and/or diagnosis of SARS-CoV-2 by FDA under an Emergency Use Authorization (EUA). This EUA will remain  in effect (meaning this test can be used) for the duration of the COVID-19 declaration under Section 564(b)(1) of the Act, 21 U.S.C.section 360bbb-3(b)(1), unless the authorization is terminated  or revoked sooner.       Influenza A by PCR NEGATIVE NEGATIVE   Influenza B by PCR NEGATIVE NEGATIVE    Comment: (NOTE) The Xpert Xpress SARS-CoV-2/FLU/RSV plus assay  is intended as an aid in the diagnosis of influenza from Nasopharyngeal swab specimens and should not be used as a sole basis for treatment. Nasal washings and aspirates are unacceptable for Xpert Xpress SARS-CoV-2/FLU/RSV testing.  Fact Sheet for Patients: EntrepreneurPulse.com.au  Fact Sheet for Healthcare Providers: IncredibleEmployment.be  This test is not yet approved or cleared by the [place] FDA and has been authorized for detection and/or diagnosis of SARS-CoV-2 by FDA under an Emergency Use Authorization (EUA). This EUA will remain in effect (meaning this test can be used) for the duration of the COVID-19 declaration under Section 564(b)(1) of the Act, 21 U.S.C. section 360bbb-3(b)(1), unless the authorization is terminated or revoked.  Performed at [facility], [address] [place], [place] [address]   CBG monitoring, ED     Status: None   Collection Time: 05/01/20  5:18 AM  Result Value Ref Range   Glucose-Capillary 91 70 - 99 mg/dL    Comment: Glucose reference range applies only to samples taken after fasting for at least 8 hours.  CBC with Differential/Platelet     Status: Abnormal   Collection Time: 05/01/20  5:53 AM  Result  Value Ref Range   WBC 1.9 (L) 4.0 - 10.5 K/uL   RBC 4.28 4.22 - 5.81 MIL/uL   Hemoglobin 12.4 (L) 13.0 - 17.0 g/dL   HCT 39.9 39.0 - 52.0 %   MCV 93.2 80.0 - 100.0 fL   MCH 29.0 26.0 - 34.0 pg   MCHC 31.1 30.0 - 36.0 g/dL   RDW 14.4 11.5 - 15.5 %   Platelets 164 150 - 400 K/uL   nRBC 0.0 0.0 - 0.2 %   Neutrophils Relative % 37 %   Neutro Abs 0.7 (L) 1.7 - 7.7 K/uL   Lymphocytes Relative 52 %   Lymphs Abs 1.0 0.7 - 4.0 K/uL   Monocytes Relative 6 %   Monocytes Absolute 0.1 0.1 - 1.0 K/uL   Eosinophils Relative 3 %   Eosinophils Absolute 0.1 0.0 - 0.5 K/uL   Basophils Relative 1 %   Basophils Absolute 0.0 0.0 - 0.1 K/uL   WBC Morphology MORPHOLOGY UNREMARKABLE    RBC Morphology  MORPHOLOGY UNREMARKABLE    Smear Review Normal platelet morphology    Immature Granulocytes 1 %   Abs Immature Granulocytes 0.01 0.00 - 0.07 K/uL    Comment: Performed at [facility], [address] [place], [place] [address]  Comprehensive metabolic panel     Status: None   Collection Time: 05/01/20  5:53 AM  Result Value Ref Range   Sodium 144 135 - 145 mmol/L   Potassium 3.8 3.5 - 5.1 mmol/L   Chloride 108 98 - 111 mmol/L   CO2 29 22 - 32 mmol/L   Glucose, Bld 95 70 - 99 mg/dL    Comment: Glucose reference range applies only to samples taken after fasting for at least 8 hours.   BUN 16 6 - 20 mg/dL   Creatinine, Ser 0.87 0.61 - 1.24 mg/dL   Calcium 9.5 8.9 - 10.3 mg/dL   Total Protein 7.5 6.5 - 8.1 g/dL   Albumin 3.6 3.5 - 5.0 g/dL   AST 22 15 - 41 U/L   ALT 16 0 - 44 U/L   Alkaline Phosphatase 56 38 - 126 U/L   Total Bilirubin 0.6 0.3 - 1.2 mg/dL   GFR, Estimated >60 >60 mL/min    Comment: (NOTE) Calculated using the CKD-EPI Creatinine Equation (2021)    Anion gap 7 5 - 15    Comment: Performed at [facility], [address] [place], [place] [address]  Lactic acid, plasma     Status: None   Collection Time: 05/01/20  5:53 AM  Result Value Ref Range   Lactic Acid, Venous 1.4 0.5 - 1.9 mmol/L    Comment: Performed at [facility], [address] [place], [place] [address]  Urinalysis, Routine w reflex microscopic Urine, Catheterized     Status: Abnormal   Collection Time: 05/01/20  5:53 AM  Result Value Ref Range   Color, Urine STRAW (A) YELLOW   APPearance CLEAR (A) CLEAR   Specific Gravity, Urine 1.005 1.005 - 1.030   pH 6.0 5.0 - 8.0   Glucose, UA NEGATIVE NEGATIVE mg/dL   Hgb urine dipstick NEGATIVE NEGATIVE   Bilirubin Urine NEGATIVE NEGATIVE   Ketones, ur NEGATIVE NEGATIVE mg/dL   Protein, ur NEGATIVE NEGATIVE mg/dL   Nitrite NEGATIVE NEGATIVE   Leukocytes,Ua NEGATIVE NEGATIVE   WBC, UA 0-5 0 - 5 WBC/hpf   Bacteria, UA NONE  SEEN NONE SEEN   Squamous Epithelial / LPF NONE SEEN 0 - 5  Sperm, UA PRESENT     Comment: Performed at [facility], [address], [place], [place] [address]  Urine Culture     Status: None   Collection Time: 05/01/20  5:53 AM   Specimen: Urine, Random  Result Value Ref Range   Specimen Description      URINE, RANDOM Performed at [facility], [address], [place], [place] [address]    Special Requests      NONE Performed at [facility], [address] [place], [place] [address]    Culture      NO GROWTH Performed at [facility], [address], [place], [place] [address]    Report Status 05/02/2020 FINAL   Procalcitonin - Baseline     Status: None   Collection Time: 05/01/20  5:53 AM  Result Value Ref Range   Procalcitonin <0.10 ng/mL    Comment:        Interpretation: PCT (Procalcitonin) <= 0.5 ng/mL: Systemic infection (sepsis) is not likely. Local bacterial infection is possible. (NOTE)       Sepsis PCT Algorithm           Lower Respiratory Tract                                      Infection PCT Algorithm    ----------------------------     ----------------------------         PCT < 0.25 ng/mL                PCT < 0.10 ng/mL          Strongly encourage             Strongly discourage   discontinuation of antibiotics    initiation of antibiotics    ----------------------------     -----------------------------       PCT 0.25 - 0.50 ng/mL            PCT 0.10 - 0.25 ng/mL               OR       >80% decrease in PCT            Discourage initiation of                                            antibiotics      Encourage discontinuation           of antibiotics    ----------------------------     -----------------------------         PCT >= 0.50 ng/mL              PCT 0.26 - 0.50 ng/mL               AND        <80% decrease in PCT             Encourage initiation of                                             antibiotics        Encourage continuation  of antibiotics    ----------------------------     -----------------------------        PCT >= 0.50 ng/mL                  PCT > 0.50 ng/mL               AND         increase in PCT                  Strongly encourage                                      initiation of antibiotics    Strongly encourage escalation           of antibiotics                                     -----------------------------                                           PCT <= 0.25 ng/mL                                                 OR                                        > 80% decrease in PCT                                      Discontinue / Do not initiate                                             antibiotics  Performed at [facility], [address] [place], [place] [address]   Pathologist smear review     Status: None   Collection Time: 05/01/20  5:53 AM  Result Value Ref Range   Path Review Blood smear is reviewed.     Comment: Leukopenia, with absolute neutropenia, and ANC of 700 per microliter. No significant left shift in maturation, and no circulating blasts. Normocytic anemia, without anisopoikilocytosis. Low normal platelet count, with unremarkable morphology. The cause of the patient's leukopenia is unclear from morphologic review alone. Potential causes of leukopenia may include nutritional deficiencies, medication/toxin exposure (including clozapine), viral illness, autoimmune/rheumatologic disease, and/or an evolving myeloid disorder. Close clinical followup is recommended. Reviewed by Kathi Simpers, M.D. Performed at [facility], [address] [place], [place] [address]   Culture, blood (Routine X 2) w Reflex to ID Panel     Status: None (Preliminary result)   Collection Time: 05/01/20  5:54 AM   Specimen: BLOOD  Result Value Ref Range   Specimen Description BLOOD RIGHT ANTECUBITAL    Special Requests  BOTTLES  DRAWN AEROBIC AND ANAEROBIC Blood Culture adequate volume   Culture      NO GROWTH 1 DAY Performed at [facility], [address] [place], [place] [address]    Report Status PENDING   Culture, blood (Routine X 2) w Reflex to ID Panel     Status: None (Preliminary result)   Collection Time: 05/01/20  6:10 AM   Specimen: BLOOD  Result Value Ref Range   Specimen Description BLOOD RIGHT ANTECUBITAL    Special Requests      BOTTLES DRAWN AEROBIC AND ANAEROBIC Blood Culture adequate volume   Culture      NO GROWTH < 24 HOURS Performed at [facility], [address] [place], [place] [address]    Report Status PENDING   Blood gas, arterial     Status: Abnormal   Collection Time: 05/01/20  7:56 AM  Result Value Ref Range   FIO2 0.40    Delivery systems NASAL CANNULA    pH, Arterial 7.50 (H) 7.350 - 7.450   pCO2 arterial 35 32.0 - 48.0 mmHg   pO2, Arterial 69 (L) 83.0 - 108.0 mmHg   Bicarbonate 28.5 (H) 20.0 - 28.0 mmol/L   Acid-Base Excess 3.9 (H) 0.0 - 2.0 mmol/L   O2 Saturation 97.3 %   Patient temperature 32.8    Collection site RIGHT RADIAL    Sample type ARTERIAL DRAW    Allens test (pass/fail) PASS PASS    Comment: Performed at [facility], [address] [place], [place] [address]  Ammonia     Status: None   Collection Time: 05/01/20  7:56 AM  Result Value Ref Range   Ammonia 28 9 - 35 umol/L    Comment: Performed at [facility], [address] [place], [place] [address]  Cortisol, Random     Status: None   Collection Time: 05/01/20  7:57 AM  Result Value Ref Range   Cortisol, Plasma 13.5 ug/dL    Comment: (NOTE) AM    6.7 - 22.6 ug/dL PM   <10.0       ug/dL Performed at [facility] [address], [place], [place] [address]   TSH     Status: None   Collection Time: 05/01/20  7:58 AM  Result Value Ref Range   TSH 0.864 0.350 - 4.500 uIU/mL    Comment: Performed by a 3rd Generation assay with a functional  sensitivity of <=0.01 uIU/mL. Performed at [facility], [address] [place], [place] [address]   Valproic acid level     Status: Abnormal   Collection Time: 05/01/20  7:58 AM  Result Value Ref Range   Valproic Acid Lvl 20 (L) 50.0 - 100.0 ug/mL    Comment: Performed at [facility], [address] [place], [place] [address]  Prolactin     Status: None   Collection Time: 05/01/20 10:56 AM  Result Value Ref Range   Prolactin 11.8 4.0 - 15.2 ng/mL    Comment: (NOTE) Performed At: [facility] [place], [place] [id number] Rush Farmer MD [id number]   HIV Antibody (routine testing w rflx)     Status: None   Collection Time: 05/01/20 10:56 AM  Result Value Ref Range   HIV Screen 4th Generation wRfx Non Reactive Non Reactive    Comment: Performed at [facility], [place] [address] [place], [place] [address]  CBC     Status: Abnormal   Collection  Time: 05/01/20 10:56 AM  Result Value Ref Range   WBC 3.9 (L) 4.0 - 10.5 K/uL   RBC 3.62 (L) 4.22 - 5.81 MIL/uL   Hemoglobin 10.5 (L) 13.0 - 17.0 g/dL   HCT 33.0 (L) 39.0 - 52.0 %   MCV 91.2 80.0 - 100.0 fL   MCH 29.0 26.0 - 34.0 pg   MCHC 31.8 30.0 - 36.0 g/dL   RDW 14.3 11.5 - 15.5 %   Platelets 141 (L) 150 - 400 K/uL   nRBC 0.0 0.0 - 0.2 %    Comment: Performed at [facility], [address] [place], [place] [address]  Creatinine, serum     Status: None   Collection Time: 05/01/20 10:56 AM  Result Value Ref Range   Creatinine, Ser 0.74 0.61 - 1.24 mg/dL   GFR, Estimated >60 >60 mL/min    Comment: (NOTE) Calculated using the CKD-EPI Creatinine Equation (2021) Performed at [facility], [place], [place] [address]   Troponin I (High Sensitivity)     Status: None   Collection Time: 05/01/20 10:56 AM  Result Value Ref Range   Troponin I (High Sensitivity) 3 <18 ng/L    Comment: (NOTE) Elevated high sensitivity troponin I (hsTnI) values and  significant  changes across serial measurements may suggest ACS but many other  chronic and acute conditions are known to elevate hsTnI results.  Refer to the "Links" section for chest pain algorithms and additional  guidance. Performed at [facility], [address] [place], [place] [address]   Brain natriuretic peptide     Status: None   Collection Time: 05/01/20 10:56 AM  Result Value Ref Range   B Natriuretic Peptide 78.5 0.0 - 100.0 pg/mL    Comment: Performed at [facility], [address], [place], [place] [address]  .Cooxemetry Panel (carboxy, met, total hgb, O2 sat)     Status: None   Collection Time: 05/01/20 10:58 AM  Result Value Ref Range   O2 Saturation 97.7 %   Carboxyhemoglobin 1.4 0.5 - 1.5 %   Methemoglobin 1.0 0.0 - 1.5 %   Total oxygen content 96.3 mL/dL    Comment: Performed at [facility], [address] [place], [place] [address]  Strep pneumoniae urinary antigen  (not at [facility])     Status: None   Collection Time: 05/01/20  1:00 PM  Result Value Ref Range   Strep Pneumo Urinary Antigen NEGATIVE NEGATIVE    Comment:        Infection due to S. pneumoniae cannot be absolutely ruled out since the antigen present may be below the detection limit of the test. PERFORMED AT [facility] Performed at [facility], [place] [address], [place], [place] [address]   D-dimer, quantitative     Status: None   Collection Time: 05/01/20  2:32 PM  Result Value Ref Range   D-Dimer, Quant 0.37 0.00 - 0.50 ug/mL-FEU    Comment: (NOTE) At the manufacturer cut-off value of 0.5 g/mL FEU, this assay has a negative predictive value of 95-100%.This assay is intended for use in conjunction with a clinical pretest probability (PTP) assessment model to exclude pulmonary embolism (PE) and deep venous thrombosis (DVT) in outpatients suspected of PE or DVT. Results should be correlated with clinical presentation. Performed at [facility], [address], [place], [place] [address]   Blood gas, arterial     Status: Abnormal   Collection Time: 05/02/20  5:00 AM  Result Value Ref Range   FIO2 0.21    pH, Arterial 7.45 7.350 - 7.450   pCO2 arterial 46 32.0 - 48.0 mmHg   pO2, Arterial 91 83.0 - 108.0 mmHg   Bicarbonate 32.0 (H) 20.0 - 28.0 mmol/L   Acid-Base Excess 7.1 (H) 0.0 - 2.0 mmol/L   O2 Saturation 97.4 %   Patient temperature 37.0    Collection site LEFT BRACHIAL    Sample type ARTERIAL DRAW     Comment: Performed at [facility], [address], [place], [place] [address]  Basic metabolic panel     Status: Abnormal   Collection Time: 05/02/20  5:54 AM  Result Value Ref Range   Sodium 144 135 - 145 mmol/L   Potassium 3.5 3.5 - 5.1 mmol/L   Chloride 109 98 - 111 mmol/L   CO2 30 22 - 32 mmol/L   Glucose, Bld 83 70 - 99 mg/dL    Comment: Glucose reference range applies only to samples taken after fasting for at least 8 hours.   BUN 12 6 - 20 mg/dL   Creatinine, Ser 0.78 0.61 - 1.24 mg/dL   Calcium 8.8 (L) 8.9 - 10.3 mg/dL   GFR, Estimated >60 >60 mL/min    Comment: (NOTE) Calculated using the CKD-EPI Creatinine Equation (2021)    Anion gap 5 5 - 15    Comment: Performed at [facility], [address] [place], [place] [address]  Magnesium     Status: None   Collection Time: 05/02/20  5:54 AM  Result Value Ref Range   Magnesium 1.9 1.7 - 2.4 mg/dL    Comment: Performed at [facility], [address] [place], [place] [address]  Phosphorus     Status: None   Collection Time: 05/02/20  5:54 AM  Result Value Ref Range   Phosphorus 3.5 2.5 - 4.6 mg/dL    Comment: Performed at [facility], [address] [place], [place] [address]  CBC with Differential/Platelet     Status: Abnormal   Collection Time: 05/02/20  5:54 AM  Result Value Ref Range   WBC 3.8 (L) 4.0 - 10.5 K/uL   RBC 3.72 (L) 4.22 - 5.81 MIL/uL   Hemoglobin 10.9 (L) 13.0 - 17.0 g/dL    HCT 34.1 (L) 39.0 - 52.0 %   MCV 91.7 80.0 - 100.0 fL   MCH 29.3 26.0 - 34.0 pg   MCHC 32.0 30.0 - 36.0 g/dL   RDW 14.1 11.5 - 15.5 %   Platelets 143 (L) 150 - 400 K/uL   nRBC 0.0 0.0 - 0.2 %   Neutrophils Relative % 75 %   Neutro Abs 2.9 1.7 - 7.7 K/uL   Lymphocytes Relative 16 %   Lymphs Abs 0.6 (L) 0.7 - 4.0 K/uL   Monocytes Relative 6 %   Monocytes Absolute 0.2 0.1 - 1.0 K/uL   Eosinophils Relative 1 %   Eosinophils Absolute 0.0 0.0 - 0.5 K/uL   Basophils Relative 1 %   Basophils Absolute 0.0 0.0 - 0.1 K/uL   Immature Granulocytes 1 %   Abs Immature Granulocytes 0.02 0.00 - 0.07 K/uL    Comment: Performed at [facility], [address] [place],  [address]  .Cooxemetry Panel (carboxy, met, total hgb, O2 sat)     Status: Abnormal   Collection Time: 05/02/20  6:16 AM  Result Value Ref Range   O2 Saturation 97.8 %   Carboxyhemoglobin 1.7 (H) 0.5 -  1.5 %   Methemoglobin 0.9 0.0 - 1.5 %   Total oxygen content 96.9 mL/dL    Comment: Performed at [facility], [address] [place], [place] [address]    Current Facility-Administered Medications  Medication Dose Route Frequency Provider Last Rate Last Admin  . albuterol (VENTOLIN HFA) 108 (90 Base) MCG/ACT inhaler 2 puff  2 puff Inhalation Q4H PRN Ivor Costa, MD      . benztropine (COGENTIN) tablet 0.5 mg  0.5 mg Oral BID Ivor Costa, MD   0.5 mg at 05/02/20 0907  . ceFEPIme (MAXIPIME) 2 g in sodium chloride 0.9 % 100 mL IVPB  2 g Intravenous Q8H Lu Duffel, RPH 200 mL/hr at 05/02/20 0457 2 g at 05/02/20 0457  . dextrose 5 % in lactated ringers infusion   Intravenous Continuous Tyler Pita, MD 100 mL/hr at 05/01/20 2233 New Bag at 05/01/20 2233  . divalproex (DEPAKOTE) DR tablet 500 mg  500 mg Oral TID Ivor Costa, MD   500 mg at 05/02/20 0908  . docusate sodium (COLACE) capsule 100 mg  100 mg Oral BID PRN Tyler Pita, MD      . enoxaparin (LOVENOX) injection 40 mg  40 mg  Subcutaneous Q24H Tyler Pita, MD   40 mg at 05/01/20 2105  . feeding supplement (ENSURE ENLIVE / ENSURE PLUS) liquid 237 mL  237 mL Oral BID BM Ivor Costa, MD   237 mL at 05/02/20 0908  . ferrous sulfate tablet 325 mg  325 mg Oral Q breakfast Ivor Costa, MD   325 mg at 05/02/20 0907  . food thickener (THICK IT) powder   Oral PRN Ivor Costa, MD      . haloperidol (HALDOL) tablet 5 mg  5 mg Oral BID [name], [name], MD   5 mg at 05/02/20 0907  . haloperidol lactate (HALDOL) injection 5 mg  5 mg Intravenous Q6H PRN Sharion Settler, NP   5 mg at 05/02/20 0455  . melatonin tablet 5 mg  5 mg Oral QHS PRN Dorothe Pea, RPH   5 mg at 05/01/20 2102  . midodrine (PROAMATINE) tablet 10 mg  10 mg Oral TID Renella Cunas, MD   10 mg at 05/02/20 0907  . ondansetron (ZOFRAN) injection 4 mg  4 mg Intravenous Q6H PRN Tyler Pita, MD      . polyethylene glycol (MIRALAX / GLYCOLAX) packet 17 g  17 g Oral Daily PRN Tyler Pita, MD      . thiamine 531m in normal saline (525m IVPB  500 mg Intravenous Daily GoTyler PitaMD 100 mL/hr at 05/02/20 0911 500 mg at 05/02/20 0911  . vancomycin (VANCOCIN) IVPB 1000 mg/200 mL premix  1,000 mg Intravenous Q12H ShLu DuffelRPH 200 mL/hr at 05/02/20 0550 1,000 mg at 05/02/20 0550    Musculoskeletal: Strength & Muscle Tone: within normal limits Gait & Station: unsteady Patient leans: N/A   Psychiatric Specialty Exam:  MENTAL STATUS EXAM:  Appearance:  AAM, appearing stated age, appears under-nourished;  wearing hospital atire. Normal level of alertness and positive facial expression.  Attitude/Behavior: child-like, engaging with appropriate eye contact.  Speech: garbled  Mood: euthymic.  Affect: blunted  Thought process: concrete  Thought content: patient denies suicidal thoughts, denies homicidal thoughts;   Thought perception: patient denies auditory and visual hallucinations. Did not appear internally stimulated at  the time of the assessment.  Cognition: patient is alert and oriented in self, place.  Insight: impaired  Judgement: impaired  Physical Exam: Physical Exam ROS Blood pressure 119/83, pulse (!) 57, temperature (!) 97.4 F (36.3 C), resp. rate 17, weight 70.1 kg, SpO2 100 %. Body mass index is 21.55 kg/m.    Recommendations:  -no need for inpatient psych admission at this moment -Clozapin management by pharmacist based on [place] level -ok to continue Haldol 5 mg twice a day for psychosis, agitation  -psychiatry will continue to follow   Disposition: Patient does not meet criteria for psychiatric inpatient admission. Supportive therapy provided about ongoing stressors.  Larita Fife, MD 05/02/2020 11:25 AM

## 2020-05-02 NOTE — Progress Notes (Signed)
Mobility Specialist - Progress Note   05/02/20 1300  Mobility  Activity Ambulated in room  Level of Assistance Moderate assist, patient does 50-74%  Assistive Device None  Distance Ambulated (ft) 40 ft  Mobility Response Tolerated well  Mobility performed by Mobility specialist  $Mobility charge 1 Mobility    Pt ambulated in room. Sitter present. Pt very impulsive. Max cueing for safety. Pt able to brush teeth and wash face with independence.    Kathee Delton Mobility Specialist 05/02/20, 1:56 PM

## 2020-05-02 NOTE — Progress Notes (Signed)
PROGRESS NOTE    Austin Gates  EZM:[id number] DOB: Jun 29, 1960 DOA: 04/30/2020 PCP: Bonnita Nasuti, MD   Chief Complain: Aggressive behavior, altered mental status  Brief Narrative: Patient is a 60 year old male with history of seizures affective disorder, bipolar type, aggressive compulsive disorder, pancytopenia, iron deficiency anemia, intellectual disability who presented with aggressive behavior, altered mental status.Patient was just released from jail.  psychiatry was consulted from ED and he was initially recommended for outpatient psychiatric treatment.  In the emergency department, he was also found to be hypoxic in room air, hypothermic.  ABG showed hypoxia.  Lab work showed pancytopenia.  Patient was suspected of sepsis and was started on broad-spectrum antibiotics.  Psychiatry has been following.  Overall status is improving.  Psychiatry is not recommending inpatient psych admission.  Monitoring him for 1 more days with current treatment with consideration of discharge tomorrow.  TOC following.  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  CT head negative for acute intracranial abnormalities.  Not taking psychiatric medications at home.  Psychiatry following, not recommended inpatient psych admission.  Home psychiatric medication regimen.  Continue to monitor mental status.  Currently he is not aggressive and currently alert and oriented.  On Cogentin, Depakote, Haldol.  Also started on thiamine.  Psychiatry planning to start on clozapine.  SIRS: Met SIRS criteria.   Patient was hypoxic, tachycardic, hypothermic on presentation.  Chest x-ray did not show any pneumonia.  Started on broad-spectrum antibiotics.  Cultures have been sent, so far negative.  Will  discontinue antibiotics .  Lab work on presentation also showed pancytopenia.. Counts improving.  Acute hypoxic respiratory failure: Resolved.  BNP normal.  Chest x-ray did not show pneumonia.  Currently on room air.  History of hypertension: On midodrine  Iron deficiency anemia: On iron supplementation  Disposition: Has been showing aggressive/belligerent behavior, hypersexuality recently.  Currently lives with sister and she is refusing to take him back at home.  TOC following for disposition.         DVT prophylaxis:Lovenox Code Status: Full Family Communication: None at bedside Status is: Inpatient  Remains inpatient appropriate because:Unsafe d/c plan   Dispo: The patient is from: Home              Anticipated d/c is to: Home              Patient currently is medically stable to d/c.   Difficult to place patient Yes     Consultants: psychiatry  Procedures:None  Antimicrobials:  Anti-infectives (From admission, onward)   Start     Dose/Rate Route Frequency Ordered Stop   05/01/20 1800  vancomycin (VANCOCIN) IVPB 1000 mg/200 mL premix        1,000 mg 200 mL/hr over 60 Minutes Intravenous Every 12 hours 05/01/20 1420     05/01/20 1430  ceFEPIme (MAXIPIME) 2 g in sodium chloride 0.9 % 100 mL IVPB        2 g 200 mL/hr over 30 Minutes Intravenous Every 8 hours 05/01/20 1416     05/01/20 0630  vancomycin (VANCOCIN) IVPB 1000 mg/200 mL premix  1,000 mg 200 mL/hr over 60 Minutes Intravenous  Once 05/01/20 0627 05/01/20 0734   05/01/20 0630  ceFEPIme (MAXIPIME) 2 g in sodium chloride 0.9 % 100 mL IVPB        2 g 200 mL/hr over 30 Minutes Intravenous  Once 05/01/20 3762 05/01/20 8315      Subjective: Patient seen and examined the bedside this afternoon.  Hemodynamically  stable.  Sitter at the bedside.  Alert and oriented, not aggressive, and appears calm and eager to go home.  Objective: Vitals:   05/02/20 0013 05/02/20 0509 05/02/20 0750 05/02/20 1215  BP: 122/88 119/83  139/90  Pulse: 97 (!) 57  91  Resp: _0 Temp: (!) 97.3 F (36.3 C) (!) 97.4 F (36.3 C)  97.7 F (36.5 C)  TempSrc:    Oral  SpO2: 99% 100% 100% 99%  Weight:  70.1 kg      Intake/Output Summary (Last 24 hours) at 05/02/2020 1437 Last data filed at 05/02/2020 1225 Gross per 24 hour  Intake 1080 ml  Output 3150 ml  Net -2070 ml   Filed Weights   05/02/20 0509  Weight: 70.1 kg    Examination:  General exam: Appears calm and comfortable ,Not in distress,thin built HEENT:PERRL,Oral mucosa moist, Ear/Nose normal on gross exam Respiratory system: Bilateral equal air entry, normal vesicular breath sounds, no wheezes or crackles  Cardiovascular system: S1 & S2 heard, RRR. No JVD, murmurs, rubs, gallops or clicks. No pedal edema. Gastrointestinal system: Abdomen is nondistended, soft and nontender. No organomegaly or masses felt. Normal bowel sounds heard. Central nervous system: Alert and oriented. No focal neurological deficits. Extremities: No edema, no clubbing ,no cyanosis Skin: No rashes, lesions or ulcers,no icterus ,no pallor Psychiatry: Judgement and insight appear impaired    Data Reviewed: I have personally reviewed following labs and imaging studies  CBC: Recent Labs  Lab 04/30/20 1151 04/30/20 1155 05/01/20 0553 05/01/20 1056 05/02/20 0554  WBC 3.9* 3.9* 1.9* 3.9* 3.8*  NEUTROABS 2.4  --  0.7*  --  2.9  HGB 10.9* 11.2* 12.4* 10.5* 10.9*  HCT 35.5* 35.1* 39.9 33.0* 34.1*  MCV 93.4 91.6 93.2 91.2 91.7  PLT 219 194 164 141* 176*   Basic Metabolic Panel: Recent Labs  Lab 04/29/20 1711 04/30/20 1155 05/01/20 0553 05/01/20 1056 05/02/20 0554  NA 139 139 144  --  144  K 4.3 4.2 3.8  --  3.5  CL 107 105 108  --  109  CO2 _1 --  30   GLUCOSE 111* 75 95  --  83  BUN 34* 22* 16  --  12  CREATININE 0.83 0.85 0.87 0.74 0.78  CALCIUM 8.8* 9.1 9.5  --  8.8*  MG  --   --   --   --  1.9  PHOS  --   --   --   --  3.5   GFR: Estimated Creatinine Clearance: 101 mL/min (by C-G formula based on SCr of 0.78 mg/dL). Liver Function Tests: Recent Labs  Lab 04/29/20 1711 04/30/20 1155 05/01/20 0553  AST _2 ALT _3 ALKPHOS 60 57 56  BILITOT 0.4 0.4 0.6  PROT 7.4 7.4 7.5  ALBUMIN 3.5 3.6 3.6   No results for input(s): LIPASE, AMYLASE in the last 168 hours. Recent Labs  Lab 05/01/20 0756  AMMONIA 28   Coagulation Profile: No results for input(s): INR, PROTIME in the last 168 hours. Cardiac  Enzymes: No results for input(s): CKTOTAL, CKMB, CKMBINDEX, TROPONINI in the last 168 hours. BNP (last 3 results) No results for input(s): PROBNP in the last 8760 hours. HbA1C: No results for input(s): HGBA1C in the last 72 hours. CBG: Recent Labs  Lab 05/01/20 0518  GLUCAP 91   Lipid Profile: No results for input(s): CHOL, HDL, LDLCALC, TRIG, CHOLHDL, LDLDIRECT in the last 72 hours. Thyroid Function Tests: Recent Labs    05/01/20 0758  TSH 0.864   Anemia Panel: No results for input(s): VITAMINB12, FOLATE, FERRITIN, TIBC, IRON, RETICCTPCT in the last 72 hours. Sepsis Labs: Recent Labs  Lab 05/01/20 0553  PROCALCITON <0.10  LATICACIDVEN 1.4    Recent Results (from the past 240 hour(s))  Resp Panel by RT-PCR (Flu A&B, Covid) Nasopharyngeal Swab     Status: None   Collection Time: 04/29/20  5:56 PM   Specimen: Nasopharyngeal Swab; Nasopharyngeal(NP) swabs in vial transport medium  Result Value Ref Range Status   SARS Coronavirus 2 by RT PCR NEGATIVE NEGATIVE Final    Comment: (NOTE) SARS-CoV-2 target nucleic acids are NOT DETECTED.  The SARS-CoV-2 RNA is generally detectable in upper respiratory specimens during the acute phase of infection. The lowest concentration of SARS-CoV-2 viral copies this  assay can detect is 138 copies/mL. A negative result does not preclude SARS-Cov-2 infection and should not be used as the sole basis for treatment or other patient management decisions. A negative result may occur with  improper specimen collection/handling, submission of specimen other than nasopharyngeal swab, presence of viral mutation(s) within the areas targeted by this assay, and inadequate number of viral copies(<138 copies/mL). A negative result must be combined with clinical observations, patient history, and epidemiological information. The expected result is Negative.  Fact Sheet for Patients:  EntrepreneurPulse.com.au  Fact Sheet for Healthcare Providers:  IncredibleEmployment.be  This test is no t yet approved or cleared by the [place] FDA and  has been authorized for detection and/or diagnosis of SARS-CoV-2 by FDA under an Emergency Use Authorization (EUA). This EUA will remain  in effect (meaning this test can be used) for the duration of the COVID-19 declaration under Section 564(b)(1) of the Act, 21 U.S.C.section 360bbb-3(b)(1), unless the authorization is terminated  or revoked sooner.       Influenza A by PCR NEGATIVE NEGATIVE Final   Influenza B by PCR NEGATIVE NEGATIVE Final    Comment: (NOTE) The Xpert Xpress SARS-CoV-2/FLU/RSV plus assay is intended as an aid in the diagnosis of influenza from Nasopharyngeal swab specimens and should not be used as a sole basis for treatment. Nasal washings and aspirates are unacceptable for Xpert Xpress SARS-CoV-2/FLU/RSV testing.  Fact Sheet for Patients: EntrepreneurPulse.com.au  Fact Sheet for Healthcare Providers: IncredibleEmployment.be  This test is not yet approved or cleared by the [place] FDA and has been authorized for detection and/or diagnosis of SARS-CoV-2 by FDA under an Emergency Use Authorization (EUA). This EUA will  remain in effect (meaning this test can be used) for the duration of the COVID-19 declaration under Section 564(b)(1) of the Act, 21 U.S.C. section 360bbb-3(b)(1), unless the authorization is terminated or revoked.  Performed at [facility], [address] [place], [place] [address]   Resp Panel by RT-PCR (Flu A&B, Covid) Nasopharyngeal Swab     Status: None   Collection Time: 04/30/20  1:59 PM   Specimen: Nasopharyngeal Swab; Nasopharyngeal(NP) swabs in vial transport medium  Result Value Ref Range Status   SARS Coronavirus 2 by RT PCR NEGATIVE  NEGATIVE Final    Comment: (NOTE) SARS-CoV-2 target nucleic acids are NOT DETECTED.  The SARS-CoV-2 RNA is generally detectable in upper respiratory specimens during the acute phase of infection. The lowest concentration of SARS-CoV-2 viral copies this assay can detect is 138 copies/mL. A negative result does not preclude SARS-Cov-2 infection and should not be used as the sole basis for treatment or other patient management decisions. A negative result may occur with  improper specimen collection/handling, submission of specimen other than nasopharyngeal swab, presence of viral mutation(s) within the areas targeted by this assay, and inadequate number of viral copies(<138 copies/mL). A negative result must be combined with clinical observations, patient history, and epidemiological information. The expected result is Negative.  Fact Sheet for Patients:  EntrepreneurPulse.com.au  Fact Sheet for Healthcare Providers:  IncredibleEmployment.be  This test is no t yet approved or cleared by the [place] FDA and  has been authorized for detection and/or diagnosis of SARS-CoV-2 by FDA under an Emergency Use Authorization (EUA). This EUA will remain  in effect (meaning this test can be used) for the duration of the COVID-19 declaration under Section 564(b)(1) of the Act, 21 U.S.C.section  360bbb-3(b)(1), unless the authorization is terminated  or revoked sooner.       Influenza A by PCR NEGATIVE NEGATIVE Final   Influenza B by PCR NEGATIVE NEGATIVE Final    Comment: (NOTE) The Xpert Xpress SARS-CoV-2/FLU/RSV plus assay is intended as an aid in the diagnosis of influenza from Nasopharyngeal swab specimens and should not be used as a sole basis for treatment. Nasal washings and aspirates are unacceptable for Xpert Xpress SARS-CoV-2/FLU/RSV testing.  Fact Sheet for Patients: EntrepreneurPulse.com.au  Fact Sheet for Healthcare Providers: IncredibleEmployment.be  This test is not yet approved or cleared by the [place] FDA and has been authorized for detection and/or diagnosis of SARS-CoV-2 by FDA under an Emergency Use Authorization (EUA). This EUA will remain in effect (meaning this test can be used) for the duration of the COVID-19 declaration under Section 564(b)(1) of the Act, 21 U.S.C. section 360bbb-3(b)(1), unless the authorization is terminated or revoked.  Performed at [facility], [address] [place], [place] [address]   Urine Culture     Status: None   Collection Time: 05/01/20  5:53 AM   Specimen: Urine, Random  Result Value Ref Range Status   Specimen Description   Final    URINE, RANDOM Performed at [facility], [address], [place], [place] [address]    Special Requests   Final    NONE Performed at [facility], [address] [place], [place] [address]    Culture   Final    NO GROWTH Performed at [facility], [place] [address] [place], [place] [address]    Report Status 05/02/2020 FINAL  Final  Culture, blood (Routine X 2) w Reflex to ID Panel     Status: None (Preliminary result)   Collection Time: 05/01/20  5:54 AM   Specimen: BLOOD  Result Value Ref Range Status   Specimen Description BLOOD RIGHT ANTECUBITAL  Final   Special Requests   Final     BOTTLES DRAWN AEROBIC AND ANAEROBIC Blood Culture adequate volume   Culture   Final    NO GROWTH 1 DAY Performed at [facility], [address] [place], [place] [address]    Report Status PENDING  Incomplete  Culture, blood (Routine X 2) w Reflex to ID Panel     Status:  None (Preliminary result)   Collection Time: 05/01/20  6:10 AM   Specimen: BLOOD  Result Value Ref Range Status   Specimen Description BLOOD RIGHT ANTECUBITAL  Final   Special Requests   Final    BOTTLES DRAWN AEROBIC AND ANAEROBIC Blood Culture adequate volume   Culture   Final    NO GROWTH < 24 HOURS Performed at [facility], [address], [place], [place] [address]    Report Status PENDING  Incomplete         Radiology Studies: CT Head Wo Contrast  Result Date: 05/01/2020 CLINICAL DATA:  Altered mental status, schizoaffective disorder EXAM: CT HEAD WITHOUT CONTRAST TECHNIQUE: Contiguous axial images were obtained from the base of the skull through the vertex without intravenous contrast. COMPARISON:  04/20/2018 FINDINGS: Brain: Normal anatomic configuration. No abnormal intra or extra-axial mass lesion or fluid collection. No abnormal mass effect or midline shift. No evidence of acute intracranial hemorrhage or infarct. Ventricular size is normal. Cerebellum unremarkable. Vascular: Unremarkable Skull: Intact Sinuses/Orbits: Paranasal sinuses are clear. Orbits are unremarkable. Other: Mastoid air cells and middle ear cavities are clear. IMPRESSION: No acute intracranial abnormality.  No calvarial fracture. Electronically Signed   By: Fidela Salisbury MD   On: 05/01/2020 06:20   DG Chest Port 1 View  Result Date: 05/02/2020 CLINICAL DATA:  Respiratory failure. EXAM: PORTABLE CHEST 1 VIEW COMPARISON:  Chest x-ray 05/01/2020 FINDINGS: Lines and tubes are noted to overlie the patient. The heart size and mediastinal contours are within normal limits. No focal consolidation. No pulmonary edema. No  pleural effusion. No pneumothorax. No acute osseous abnormality. IMPRESSION: No active disease. Electronically Signed   By: Iven Finn M.D.   On: 05/02/2020 05:52   DG Chest Portable 1 View  Result Date: 05/01/2020 CLINICAL DATA:  Hypothermia EXAM: PORTABLE CHEST 1 VIEW COMPARISON:  None. FINDINGS: The heart size and mediastinal contours are within normal limits. Both lungs are clear. The visualized skeletal structures are unremarkable. IMPRESSION: No active disease. Electronically Signed   By: Fidela Salisbury MD   On: 05/01/2020 06:13        Scheduled Meds: . benztropine  0.5 mg Oral BID  . divalproex  500 mg Oral TID  . enoxaparin (LOVENOX) injection  40 mg Subcutaneous Q24H  . feeding supplement  237 mL Oral BID BM  . ferrous sulfate  325 mg Oral Q breakfast  . haloperidol  5 mg Oral BID  . midodrine  10 mg Oral TID AC   Continuous Infusions: . ceFEPime (MAXIPIME) IV 2 g (05/02/20 1324)  . thiamine injection 500 mg (05/02/20 0911)  . vancomycin 1,000 mg (05/02/20 0550)     LOS: 1 day    Time spent: 35 mins,More than 50% of that time was spent in counseling and/or coordination of care.      Shelly Coss, MD [facility] P3/01/2021, 2:37 PM
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
## 2020-05-03 DIAGNOSIS — F71 Moderate intellectual disabilities: Secondary | ICD-10-CM | POA: Diagnosis not present

## 2020-05-03 LAB — CBC WITH DIFFERENTIAL/PLATELET
Abs Immature Granulocytes: 0.01 10*3/uL (ref 0.00–0.07)
Basophils Absolute: 0 10*3/uL (ref 0.0–0.1)
Basophils Relative: 1 %
Eosinophils Absolute: 0 10*3/uL (ref 0.0–0.5)
Eosinophils Relative: 1 %
HCT: 32.4 % — ABNORMAL LOW (ref 39.0–52.0)
Hemoglobin: 10.2 g/dL — ABNORMAL LOW (ref 13.0–17.0)
Immature Granulocytes: 0 %
Lymphocytes Relative: 24 %
Lymphs Abs: 1 10*3/uL (ref 0.7–4.0)
MCH: 29.1 pg (ref 26.0–34.0)
MCHC: 31.5 g/dL (ref 30.0–36.0)
MCV: 92.3 fL (ref 80.0–100.0)
Monocytes Absolute: 0.3 10*3/uL (ref 0.1–1.0)
Monocytes Relative: 8 %
Neutro Abs: 2.6 10*3/uL (ref 1.7–7.7)
Neutrophils Relative %: 66 %
Platelets: 141 10*3/uL — ABNORMAL LOW (ref 150–400)
RBC: 3.51 MIL/uL — ABNORMAL LOW (ref 4.22–5.81)
RDW: 14.3 % (ref 11.5–15.5)
WBC: 4 10*3/uL (ref 4.0–10.5)
nRBC: 0 % (ref 0.0–0.2)

## 2020-05-03 LAB — CREATININE, SERUM
Creatinine, Ser: 0.96 mg/dL (ref 0.61–1.24)
GFR, Estimated: >60 mL/min (ref >60)

## 2020-05-03 LAB — LEGIONELLA PNEUMOPHILA SEROGP 1 UR AG: L. pneumophila Serogp 1 Ur Ag: NEGATIVE

## 2020-05-03 LAB — CARBON MONOXIDE, BLOOD (PERFORMED AT REF LAB): Carbon Monoxide, Blood: 5.1 % — ABNORMAL HIGH (ref 0.0–3.6)

## 2020-05-03 MED ORDER — LORAZEPAM 2 MG/ML IJ SOLN
1.0000 mg | Freq: Four times a day (QID) | INTRAMUSCULAR | Status: DC | PRN
  Administered 2020-05-03 – 2020-05-06 (×8): 1 mg via INTRAVENOUS
  Filled 2020-05-03 (×9): qty 1

## 2020-05-03 MED ORDER — CLOZAPINE 25 MG PO TABS
25.0000 mg | ORAL_TABLET | Freq: Every day | ORAL | Status: DC
  Administered 2020-05-03 – 2020-05-07 (×5): 25 mg via ORAL
  Filled 2020-05-03 (×5): qty 1

## 2020-05-03 NOTE — Consult Note (Signed)
[facility] Face-to-Face Psychiatry Consult   Reason for Consult:  follow-up with 60 year old man with chronic severe mental health problems who is being admitted to medicine Referring Physician:  Dr. 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 male patient admitted with with agitation and difficulty controlling his behavior.  In ER patient was observed to have oxygen desaturations and pancytopenia and low ANC.  He is being admitted to the medical service.  Patient seen today for psych follow-op. Chart reviewed. Patient`s vitals seem to be stable; pO2 100% room air. WBC improved from 1.9 to 3.9 yesterday and 4.0 today; ANC improved from 0.7 to 2.9 yesterday and 2.6 today.  Per nurse, patient had some episodes of agitation last night, although he did not express aggression and was redirectable.  Patient reports "doing okay", "want to sleep". Reports good mood, denies feeling depressed, anxious, suicidal, homicidal, denies hallucinations. Patient is appears at his mental baseline at the time of this assessment.  Per SW note, patient`s sister reported that patient is not at his mental baseline and that she does not want to take him back to her place. So, the patient is currently has no place to go in case of discharge from the medical floor.    Risk to Self:   Risk to Others:   Prior Inpatient Therapy:   Prior Outpatient Therapy:    Past Medical History:  Past Medical  History:  Diagnosis Date  . Hypertension   . Intellectual disability   . Obsessive-compulsive disorder   . Schizo-affective schizophrenia [facility])     Past Surgical History:  Procedure Laterality Date  . COLONOSCOPY WITH PROPOFOL N/A 12/28/2017   Procedure: COLONOSCOPY WITH PROPOFOL;  Surgeon: Jonathon Bellows, MD;  Location: [facility] ENDOSCOPY;  Service: Gastroenterology;  Laterality: N/A;  . HYDROCELE EXCISION Bilateral 02/22/2018   Procedure: bilateral HYDROCELECTOMY ADULT;  Surgeon: Cleon Gustin, MD;  Location: [place];  Service: Urology;  Laterality: Bilateral;  . INSERTION OF ILIAC STENT Left 02/22/2018   Procedure: INSERTION LEFT SUPERIOR FEMORAL ARTERY USING 6MM X 5CM VIABHON STENT with mynx device closure on right femoral artery;  Surgeon: Waynetta Sandy, MD;  Location: [place];  Service: Vascular;  Laterality: Left;  . KNEE ARTHROSCOPY    . SCROTAL EXPLORATION N/A 02/22/2018   Procedure: SCROTUM EXPLORATION;  Surgeon: Cleon Gustin, MD;  Location: [place];  Service: Urology;  Laterality: N/A;  . UPPER EXTREMITY ANGIOGRAM Left 02/22/2018   Procedure: left lower EXTREMITY ANGIOGRAM;  Surgeon: Waynetta Sandy, MD;  Location: [facility] OR;  Service: Vascular;  Laterality: Left;   Family History:  Family History  Problem Relation Age of Onset  . Diabetes Mother    Family Psychiatric  History:  Social History:  Social History   Substance and Sexual Activity  Alcohol Use No     Social History   Substance and Sexual Activity  Drug Use No    Social History   Socioeconomic History  . Marital status: Single    Spouse name: Not on  file  . Number of children: Not on file  . Years of education: Not on file  . Highest education level: Not on file  Occupational History  . Occupation: Disabled  Tobacco Use  . Smoking status: Former Smoker    Types: Cigarettes  . Smokeless tobacco: Never Used  Vaping Use  . Vaping Use: Unknown  Substance and Sexual Activity  . Alcohol  use: No  . Drug use: No  . Sexual activity: Never  Other Topics Concern  . Not on file  Social History Narrative   ** Merged History Encounter **       Social Determinants of Health   Financial Resource Strain: Not on file  Food Insecurity: Not on file  Transportation Needs: Not on file  Physical Activity: Not on file  Stress: Not on file  Social Connections: Not on file   Additional Social History:    Allergies:  No Known Allergies  Labs:  Results for orders placed or performed during the hospital encounter of 04/30/20 (from the past 48 hour(s))  Prolactin     Status: None   Collection Time: 05/01/20 10:56 AM  Result Value Ref Range   Prolactin 11.8 4.0 - 15.2 ng/mL    Comment: (NOTE) Performed At: [facility] [place], [place] [id number] Rush Farmer MD [id number]   HIV Antibody (routine testing w rflx)     Status: None   Collection Time: 05/01/20 10:56 AM  Result Value Ref Range   HIV Screen 4th Generation wRfx Non Reactive Non Reactive    Comment: Performed at [facility], [address], [place], [place] [address]  CBC     Status: Abnormal   Collection Time: 05/01/20 10:56 AM  Result Value Ref Range   WBC 3.9 (L) 4.0 - 10.5 K/uL   RBC 3.62 (L) 4.22 - 5.81 MIL/uL   Hemoglobin 10.5 (L) 13.0 - 17.0 g/dL   HCT 33.0 (L) 39.0 - 52.0 %   MCV 91.2 80.0 - 100.0 fL   MCH 29.0 26.0 - 34.0 pg   MCHC 31.8 30.0 - 36.0 g/dL   RDW 14.3 11.5 - 15.5 %   Platelets 141 (L) 150 - 400 K/uL   nRBC 0.0 0.0 - 0.2 %    Comment: Performed at [facility], [address] [place], [place] [address]  Creatinine, serum     Status: None   Collection Time: 05/01/20 10:56 AM  Result Value Ref Range   Creatinine, Ser 0.74 0.61 - 1.24 mg/dL   GFR, Estimated >60 >60 mL/min    Comment: (NOTE) Calculated using the CKD-EPI Creatinine Equation (2021) Performed at [facility], [place], [place] [address]   Troponin I  (High Sensitivity)     Status: None   Collection Time: 05/01/20 10:56 AM  Result Value Ref Range   Troponin I (High Sensitivity) 3 <18 ng/L    Comment: (NOTE) Elevated high sensitivity troponin I (hsTnI) values and significant  changes across serial measurements may suggest ACS but many other  chronic and acute conditions are known to elevate hsTnI results.  Refer to the "Links" section for chest pain algorithms and additional  guidance. Performed at [facility], [address] [place], [place] [address]   Brain natriuretic peptide     Status: None   Collection Time: 05/01/20 10:56 AM  Result Value Ref Range   B Natriuretic Peptide 78.5 0.0 - 100.0 pg/mL    Comment: Performed at  [facility], [address] [place], [place] [address]  .Cooxemetry Panel (carboxy, met, total hgb, O2 sat)     Status: None   Collection Time: 05/01/20 10:58 AM  Result Value Ref Range   O2 Saturation 97.7 %   Carboxyhemoglobin 1.4 0.5 - 1.5 %   Methemoglobin 1.0 0.0 - 1.5 %   Total oxygen content 96.3 mL/dL    Comment: Performed at [facility], [address] [place], [place] [address]  Strep pneumoniae urinary antigen  (not at [facility])     Status: None   Collection Time: 05/01/20  1:00 PM  Result Value Ref Range   Strep Pneumo Urinary Antigen NEGATIVE NEGATIVE    Comment:        Infection due to S. pneumoniae cannot be absolutely ruled out since the antigen present may be below the detection limit of the test. PERFORMED AT [facility] Performed at [facility], [place] [address], [place], [place] [address]   D-dimer, quantitative     Status: None   Collection Time: 05/01/20  2:32 PM  Result Value Ref Range   D-Dimer, Quant 0.37 0.00 - 0.50 ug/mL-FEU    Comment: (NOTE) At the manufacturer cut-off value of 0.5 g/mL FEU, this assay has a negative predictive value of 95-100%.This assay is intended for use in conjunction with a clinical pretest  probability (PTP) assessment model to exclude pulmonary embolism (PE) and deep venous thrombosis (DVT) in outpatients suspected of PE or DVT. Results should be correlated with clinical presentation. Performed at [facility], [address] [place], [place] [address]   Blood gas, arterial     Status: Abnormal   Collection Time: 05/02/20  5:00 AM  Result Value Ref Range   FIO2 0.21    pH, Arterial 7.45 7.350 - 7.450   pCO2 arterial 46 32.0 - 48.0 mmHg   pO2, Arterial 91 83.0 - 108.0 mmHg   Bicarbonate 32.0 (H) 20.0 - 28.0 mmol/L   Acid-Base Excess 7.1 (H) 0.0 - 2.0 mmol/L   O2 Saturation 97.4 %   Patient temperature 37.0    Collection site LEFT BRACHIAL    Sample type ARTERIAL DRAW     Comment: Performed at [facility], [address], [place], [place] [address]  Basic metabolic panel     Status: Abnormal   Collection Time: 05/02/20  5:54 AM  Result Value Ref Range   Sodium 144 135 - 145 mmol/L   Potassium 3.5 3.5 - 5.1 mmol/L   Chloride 109 98 - 111 mmol/L   CO2 30 22 - 32 mmol/L   Glucose, Bld 83 70 - 99 mg/dL    Comment: Glucose reference range applies only to samples taken after fasting for at least 8 hours.   BUN 12 6 - 20 mg/dL   Creatinine, Ser 0.78 0.61 - 1.24 mg/dL   Calcium 8.8 (L) 8.9 - 10.3 mg/dL   GFR, Estimated >60 >60 mL/min    Comment: (NOTE) Calculated using the CKD-EPI Creatinine Equation (2021)    Anion gap 5 5 - 15    Comment: Performed at [facility], [address] [place], [place] [address]  Magnesium     Status: None   Collection Time: 05/02/20  5:54 AM  Result Value Ref Range   Magnesium 1.9 1.7 - 2.4 mg/dL    Comment: Performed at [facility], [address], [place], [place] [address]  Phosphorus     Status: None  Collection Time: 05/02/20  5:54 AM  Result Value Ref Range   Phosphorus 3.5 2.5 - 4.6 mg/dL    Comment: Performed at [facility], [address] [place], [place] [address]   CBC with Differential/Platelet     Status: Abnormal   Collection Time: 05/02/20  5:54 AM  Result Value Ref Range   WBC 3.8 (L) 4.0 - 10.5 K/uL   RBC 3.72 (L) 4.22 - 5.81 MIL/uL   Hemoglobin 10.9 (L) 13.0 - 17.0 g/dL   HCT 34.1 (L) 39.0 - 52.0 %   MCV 91.7 80.0 - 100.0 fL   MCH 29.3 26.0 - 34.0 pg   MCHC 32.0 30.0 - 36.0 g/dL   RDW 14.1 11.5 - 15.5 %   Platelets 143 (L) 150 - 400 K/uL   nRBC 0.0 0.0 - 0.2 %   Neutrophils Relative % 75 %   Neutro Abs 2.9 1.7 - 7.7 K/uL   Lymphocytes Relative 16 %   Lymphs Abs 0.6 (L) 0.7 - 4.0 K/uL   Monocytes Relative 6 %   Monocytes Absolute 0.2 0.1 - 1.0 K/uL   Eosinophils Relative 1 %   Eosinophils Absolute 0.0 0.0 - 0.5 K/uL   Basophils Relative 1 %   Basophils Absolute 0.0 0.0 - 0.1 K/uL   Immature Granulocytes 1 %   Abs Immature Granulocytes 0.02 0.00 - 0.07 K/uL    Comment: Performed at [facility], [address], [place], [place] [address]  .Cooxemetry Panel (carboxy, met, total hgb, O2 sat)     Status: Abnormal   Collection Time: 05/02/20  6:16 AM  Result Value Ref Range   O2 Saturation 97.8 %   Carboxyhemoglobin 1.7 (H) 0.5 - 1.5 %   Methemoglobin 0.9 0.0 - 1.5 %   Total oxygen content 96.9 mL/dL    Comment: Performed at [facility], [address] [place], [place] [address]  CBC with Differential/Platelet     Status: Abnormal   Collection Time: 05/03/20  5:22 AM  Result Value Ref Range   WBC 4.0 4.0 - 10.5 K/uL   RBC 3.51 (L) 4.22 - 5.81 MIL/uL   Hemoglobin 10.2 (L) 13.0 - 17.0 g/dL   HCT 32.4 (L) 39.0 - 52.0 %   MCV 92.3 80.0 - 100.0 fL   MCH 29.1 26.0 - 34.0 pg   MCHC 31.5 30.0 - 36.0 g/dL   RDW 14.3 11.5 - 15.5 %   Platelets 141 (L) 150 - 400 K/uL   nRBC 0.0 0.0 - 0.2 %   Neutrophils Relative % 66 %   Neutro Abs 2.6 1.7 - 7.7 K/uL   Lymphocytes Relative 24 %   Lymphs Abs 1.0 0.7 - 4.0 K/uL   Monocytes Relative 8 %   Monocytes Absolute 0.3 0.1 - 1.0 K/uL   Eosinophils Relative 1 %    Eosinophils Absolute 0.0 0.0 - 0.5 K/uL   Basophils Relative 1 %   Basophils Absolute 0.0 0.0 - 0.1 K/uL   Immature Granulocytes 0 %   Abs Immature Granulocytes 0.01 0.00 - 0.07 K/uL    Comment: Performed at [facility], [address] [place], [place] [address]  Creatinine, serum     Status: None   Collection Time: 05/03/20  5:22 AM  Result Value Ref Range   Creatinine, Ser 0.96 0.61 - 1.24 mg/dL   GFR, Estimated >60 >60 mL/min    Comment: (NOTE) Calculated using the CKD-EPI Creatinine Equation (2021) Performed at [facility], [address] [place], [place] [address]     Current Facility-Administered Medications  Medication Dose Route Frequency Provider Last Rate Last Admin  . albuterol (VENTOLIN HFA) 108 (90 Base) MCG/ACT inhaler 2 puff  2 puff Inhalation Q4H PRN Ivor Costa, MD      . benztropine (COGENTIN) tablet 0.5 mg  0.5 mg Oral BID Ivor Costa, MD   0.5 mg at 05/03/20 0817  . divalproex (DEPAKOTE) DR tablet 500 mg  500 mg Oral TID Ivor Costa, MD   500 mg at 05/03/20 0817  . docusate sodium (COLACE) capsule 100 mg  100 mg Oral BID PRN Tyler Pita, MD      . enoxaparin (LOVENOX) injection 40 mg  40 mg Subcutaneous Q24H Tyler Pita, MD   40 mg at 05/02/20 2106  . feeding supplement (ENSURE ENLIVE / ENSURE PLUS) liquid 237 mL  237 mL Oral BID BM Ivor Costa, MD   237 mL at 05/02/20 1324  . ferrous sulfate tablet 325 mg  325 mg Oral Q breakfast Ivor Costa, MD   325 mg at 05/03/20 0817  . food thickener (THICK IT) powder   Oral PRN Ivor Costa, MD      . haloperidol (HALDOL) tablet 5 mg  5 mg Oral BID [name], John T, MD   5 mg at 05/02/20 2106  . haloperidol lactate (HALDOL) injection 5 mg  5 mg Intravenous Q6H PRN Sharion Settler, NP   5 mg at 05/03/20 0817  . melatonin tablet 5 mg  5 mg Oral QHS PRN Dorothe Pea, RPH   5 mg at 05/01/20 2102  . midodrine (PROAMATINE) tablet 10 mg  10 mg Oral TID Renella Cunas, MD   10 mg at 05/03/20 0817  .  ondansetron (ZOFRAN) injection 4 mg  4 mg Intravenous Q6H PRN Tyler Pita, MD      . polyethylene glycol (MIRALAX / GLYCOLAX) packet 17 g  17 g Oral Daily PRN Tyler Pita, MD      . thiamine 528m in normal saline (560m IVPB  500 mg Intravenous Daily GoTyler PitaMD   Stopped at 05/02/20 0941    Musculoskeletal: Strength & Muscle Tone: within normal limits Gait & Station: unsteady Patient leans: N/A   Psychiatric Specialty Exam:  MENTAL STATUS EXAM:  Appearance:  AAM, appearing stated age, appears under-nourished;  wearing hospital atire. Normal level of alertness and positive facial expression.  Attitude/Behavior: child-like, engaging with appropriate eye contact.  Speech: garbled  Mood: euthymic.  Affect: blunted  Thought process: concrete  Thought content: patient denies suicidal thoughts, denies homicidal thoughts;   Thought perception: patient denies auditory and visual hallucinations. Did not appear internally stimulated at the time of the assessment.  Cognition: patient is alert and oriented in self, place.  Insight: impaired  Judgement: impaired  Physical Exam: Physical Exam  ROS  Blood pressure 121/80, pulse 74, temperature 97.7 F (36.5 C), temperature source Oral, resp. rate (!) 22, weight 70.1 kg, SpO2 99 %. Body mass index is 21.55 kg/m.    Recommendations:  -I still believe that patient has no need for acute inpatient psych admission at this moment. Patient`s behavior problems are longstanding and seem to be his chronic condition related to moderate intellectual disability for which an acute hospitalization may not be of much benefit, the least restrictive setting for him would be a residential placement to mitigate and treat his chronic condition.  -Historically this patient has done best when he is on  clozapine and mood stabilizers with other antipsychotics on top of it.  I recommend, after the consultation with pharmacist  considering patient`s latest WBC/ANC levels and under daily [place] monitoring, restarting the 50 mg of clozapine at night while continuing the Depakote.   -ok to continue Haldol 5 mg twice a day for psychosis, agitation   -psychiatry will continue to follow.   Disposition: Patient does not meet criteria for psychiatric inpatient admission. Supportive therapy provided about ongoing stressors.  Larita Fife, MD 05/03/2020 11:10 AM

## 2020-05-03 NOTE — Progress Notes (Signed)
Pharmacy - Clozapine     This patient's order has been reviewed for prescribing contraindications.   Labs:    Date ANC Dose 3/10   2400 50mg  3/11  700 DC'ed 3/12 2900    3/13 2600 25mg   Restarting at a lower dose of 25mg  - will be monitoring ANC daily for now  The medication is being dispensed pursuant to the FDA REMS suspension order of 01/10/20 that allows for dispensing without a patient REMS dispense authorization (RDA).    Lu Duffel, PharmD, BCPS Clinical Pharmacist 05/03/2020 3:02 PM
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
## 2020-05-03 NOTE — Progress Notes (Signed)
PROGRESS NOTE    Austin Gates  ZHG:[id number] DOB: 02-23-1960 DOA: 04/30/2020 PCP: Bonnita Nasuti, MD   Chief Complain: Aggressive behavior, altered mental status  Brief Narrative: Patient is a 60 year old male with history of seizures affective disorder, bipolar type, aggressive compulsive disorder, pancytopenia, iron deficiency anemia, intellectual disability who presented with aggressive behavior, altered mental status.Patient was just released from jail.  psychiatry was consulted from ED and he was initially recommended for outpatient psychiatric treatment.  In the emergency department, he was also found to be hypoxic in room air, hypothermic.  ABG showed hypoxia.  Lab work showed pancytopenia.  Patient was suspected of sepsis and was started on broad-spectrum antibiotics.  Psychiatry has been following.  Overall status is improving.  Psychiatry is not recommending inpatient psych admission.  Medically stable for discharge.  Discharge is pending due to problem with  disposition..  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  CT head negative for acute intracranial abnormalities.  Not taking psychiatric medications at home.  Psychiatry following, not recommended inpatient psych admission.  Home psychiatric medication regimen.  Continue to monitor mental status.  Currently he is not aggressive and currently alert and oriented.  On Cogentin, Depakote, Haldol.  Also started on thiamine.  Psychiatry recommended  to start on clozapine.  SIRS: Met SIRS criteria.  Patient was hypoxic,  tachycardic, hypothermic on presentation.  Chest x-ray did not show any pneumonia.  Started on broad-spectrum antibiotics.  Cultures have been sent, so far negative.  We discontinued antibiotics .  Lab work on presentation also showed pancytopenia.. Counts improving.  Acute hypoxic respiratory failure: Resolved.  BNP normal.  Chest x-ray did not show pneumonia.  Currently on room air.  History of hypertension: On midodrine  Iron deficiency anemia: On iron supplementation  Disposition: Has been showing aggressive/belligerent behavior, hypersexuality recently.  Currently lives with sister and she is refusing to take him back at home.  TOC following for disposition.  Could not discharge due to lack of disposition         DVT prophylaxis:Lovenox Code Status: Full Family Communication: None at bedside Status is: Inpatient  Remains inpatient appropriate because:Unsafe d/c plan   Dispo: The patient is from: Home              Anticipated d/c is to: Home              Patient currently is medically stable to d/c.   Difficult to place patient Yes     Consultants: psychiatry  Procedures:None  Antimicrobials:  Anti-infectives (From admission, onward)   Start     Dose/Rate Route Frequency Ordered Stop   05/01/20 1800  vancomycin (VANCOCIN) IVPB 1000 mg/200 mL premix  Status:  Discontinued        1,000 mg 200 mL/hr over 60 Minutes Intravenous Every 12 hours 05/01/20 1420 05/02/20 1444   05/01/20 1430  ceFEPIme (MAXIPIME) 2 g in sodium chloride 0.9 % 100 mL IVPB  Status:  Discontinued        2 g 200 mL/hr over 30 Minutes Intravenous Every 8 hours 05/01/20 1416 05/02/20 1444  05/01/20 0630  vancomycin (VANCOCIN) IVPB 1000 mg/200 mL premix        1,000 mg 200 mL/hr over 60 Minutes Intravenous  Once 05/01/20 0627 05/01/20 0734   05/01/20 0630  ceFEPIme (MAXIPIME) 2 g in sodium chloride 0.9 % 100 mL IVPB        2 g 200 mL/hr over 30 Minutes Intravenous  Once 05/01/20 6067 05/01/20 7034       Subjective: Patient seen and examined at the bedside this morning.  Hemodynamically stable and comfortable during my evaluation.  Lying on the bed.  Laughing without any cause  Objective: Vitals:   05/03/20 0300 05/03/20 0544 05/03/20 0833 05/03/20 1216  BP: 110/69 120/76 121/80 116/86  Pulse: 86 63 74 80  Resp: 17 17 (!) 22 20  Temp: 98.5 F (36.9 C) (!) 97.5 F (36.4 C) 97.7 F (36.5 C) 97.7 F (36.5 C)  TempSrc: Oral Oral Oral   SpO2: 99% 100% 99% 100%  Weight:        Intake/Output Summary (Last 24 hours) at 05/03/2020 1431 Last data filed at 05/03/2020 1217 Gross per 24 hour  Intake 2140 ml  Output -  Net 2140 ml   Filed Weights   05/02/20 0509  Weight: 70.1 kg    Examination:  General exam: Overall comfortable, not in distress, laughing inappropriately HEENT: PERRL Respiratory system:  no wheezes or crackles  Cardiovascular system: S1 & S2 heard, RRR.  Gastrointestinal system: Abdomen is nondistended, soft and nontender. Central nervous system: Alert and oriented Extremities: No edema, no clubbing ,no cyanosis Skin: No rashes, no ulcers,no icterus    Data Reviewed: I have personally reviewed following labs and imaging studies  CBC: Recent Labs  Lab 04/30/20 1151 04/30/20 1155 05/01/20 0553 05/01/20 1056 05/02/20 0554 05/03/20 0522  WBC 3.9* 3.9* 1.9* 3.9* 3.8* 4.0  NEUTROABS 2.4  --  0.7*  --  2.9 2.6  HGB 10.9* 11.2* 12.4* 10.5* 10.9* 10.2*  HCT 35.5* 35.1* 39.9 33.0* 34.1* 32.4*  MCV 93.4 91.6 93.2 91.2 91.7 92.3  PLT 219 194 164 141* 143* 035*   Basic Metabolic Panel: Recent Labs  Lab 04/29/20 1711 04/30/20 1155 05/01/20 0553 05/01/20 1056 05/02/20 0554 05/03/20 0522  NA 139 139 144  --  144  --   K 4.3 4.2 3.8  --  3.5  --   CL 107 105 108  --  109  --   CO2 '24 29 29  ' --  30  --   GLUCOSE 111* 75 95  --  83  --   BUN 34* 22* 16  --  12  --   CREATININE 0.83 0.85 0.87 0.74 0.78 0.96  CALCIUM 8.8* 9.1 9.5  --  8.8*  --   MG   --   --   --   --  1.9  --   PHOS  --   --   --   --  3.5  --    GFR: Estimated Creatinine Clearance: 84.2 mL/min (by C-G formula based on SCr of 0.96 mg/dL). Liver Function Tests: Recent Labs  Lab 04/29/20 1711 04/30/20 1155 05/01/20 0553  AST '19 18 22  ' ALT '14 14 16  ' ALKPHOS 60 57 56  BILITOT 0.4 0.4 0.6  PROT 7.4 7.4 7.5  ALBUMIN 3.5 3.6 3.6   No results for input(s): LIPASE, AMYLASE in the last 168 hours. Recent Labs  Lab 05/01/20 0756  AMMONIA 28   Coagulation Profile: No results for input(s): INR,  PROTIME in the last 168 hours. Cardiac Enzymes: No results for input(s): CKTOTAL, CKMB, CKMBINDEX, TROPONINI in the last 168 hours. BNP (last 3 results) No results for input(s): PROBNP in the last 8760 hours. HbA1C: No results for input(s): HGBA1C in the last 72 hours. CBG: Recent Labs  Lab 05/01/20 0518  GLUCAP 91   Lipid Profile: No results for input(s): CHOL, HDL, LDLCALC, TRIG, CHOLHDL, LDLDIRECT in the last 72 hours. Thyroid Function Tests: Recent Labs    05/01/20 0758  TSH 0.864   Anemia Panel: No results for input(s): VITAMINB12, FOLATE, FERRITIN, TIBC, IRON, RETICCTPCT in the last 72 hours. Sepsis Labs: Recent Labs  Lab 05/01/20 0553  PROCALCITON <0.10  LATICACIDVEN 1.4    Recent Results (from the past 240 hour(s))  Resp Panel by RT-PCR (Flu A&B, Covid) Nasopharyngeal Swab     Status: None   Collection Time: 04/29/20  5:56 PM   Specimen: Nasopharyngeal Swab; Nasopharyngeal(NP) swabs in vial transport medium  Result Value Ref Range Status   SARS Coronavirus 2 by RT PCR NEGATIVE NEGATIVE Final    Comment: (NOTE) SARS-CoV-2 target nucleic acids are NOT DETECTED.  The SARS-CoV-2 RNA is generally detectable in upper respiratory specimens during the acute phase of infection. The lowest concentration of SARS-CoV-2 viral copies this assay can detect is 138 copies/mL. A negative result does not preclude SARS-Cov-2 infection and should not be used as  the sole basis for treatment or other patient management decisions. A negative result may occur with  improper specimen collection/handling, submission of specimen other than nasopharyngeal swab, presence of viral mutation(s) within the areas targeted by this assay, and inadequate number of viral copies(<138 copies/mL). A negative result must be combined with clinical observations, patient history, and epidemiological information. The expected result is Negative.  Fact Sheet for Patients:  EntrepreneurPulse.com.au  Fact Sheet for Healthcare Providers:  IncredibleEmployment.be  This test is no t yet approved or cleared by the [place] FDA and  has been authorized for detection and/or diagnosis of SARS-CoV-2 by FDA under an Emergency Use Authorization (EUA). This EUA will remain  in effect (meaning this test can be used) for the duration of the COVID-19 declaration under Section 564(b)(1) of the Act, 21 U.S.C.section 360bbb-3(b)(1), unless the authorization is terminated  or revoked sooner.       Influenza A by PCR NEGATIVE NEGATIVE Final   Influenza B by PCR NEGATIVE NEGATIVE Final    Comment: (NOTE) The Xpert Xpress SARS-CoV-2/FLU/RSV plus assay is intended as an aid in the diagnosis of influenza from Nasopharyngeal swab specimens and should not be used as a sole basis for treatment. Nasal washings and aspirates are unacceptable for Xpert Xpress SARS-CoV-2/FLU/RSV testing.  Fact Sheet for Patients: EntrepreneurPulse.com.au  Fact Sheet for Healthcare Providers: IncredibleEmployment.be  This test is not yet approved or cleared by the [place] FDA and has been authorized for detection and/or diagnosis of SARS-CoV-2 by FDA under an Emergency Use Authorization (EUA). This EUA will remain in effect (meaning this test can be used) for the duration of the COVID-19 declaration under Section 564(b)(1) of  the Act, 21 U.S.C. section 360bbb-3(b)(1), unless the authorization is terminated or revoked.  Performed at [facility], [address] [place], [place] [address]   Resp Panel by RT-PCR (Flu A&B, Covid) Nasopharyngeal Swab     Status: None   Collection Time: 04/30/20  1:59 PM   Specimen: Nasopharyngeal Swab; Nasopharyngeal(NP) swabs in vial transport medium  Result Value Ref Range Status  SARS Coronavirus 2 by RT PCR NEGATIVE NEGATIVE Final    Comment: (NOTE) SARS-CoV-2 target nucleic acids are NOT DETECTED.  The SARS-CoV-2 RNA is generally detectable in upper respiratory specimens during the acute phase of infection. The lowest concentration of SARS-CoV-2 viral copies this assay can detect is 138 copies/mL. A negative result does not preclude SARS-Cov-2 infection and should not be used as the sole basis for treatment or other patient management decisions. A negative result may occur with  improper specimen collection/handling, submission of specimen other than nasopharyngeal swab, presence of viral mutation(s) within the areas targeted by this assay, and inadequate number of viral copies(<138 copies/mL). A negative result must be combined with clinical observations, patient history, and epidemiological information. The expected result is Negative.  Fact Sheet for Patients:  EntrepreneurPulse.com.au  Fact Sheet for Healthcare Providers:  IncredibleEmployment.be  This test is no t yet approved or cleared by the [place] FDA and  has been authorized for detection and/or diagnosis of SARS-CoV-2 by FDA under an Emergency Use Authorization (EUA). This EUA will remain  in effect (meaning this test can be used) for the duration of the COVID-19 declaration under Section 564(b)(1) of the Act, 21 U.S.C.section 360bbb-3(b)(1), unless the authorization is terminated  or revoked sooner.       Influenza A by PCR NEGATIVE NEGATIVE  Final   Influenza B by PCR NEGATIVE NEGATIVE Final    Comment: (NOTE) The Xpert Xpress SARS-CoV-2/FLU/RSV plus assay is intended as an aid in the diagnosis of influenza from Nasopharyngeal swab specimens and should not be used as a sole basis for treatment. Nasal washings and aspirates are unacceptable for Xpert Xpress SARS-CoV-2/FLU/RSV testing.  Fact Sheet for Patients: EntrepreneurPulse.com.au  Fact Sheet for Healthcare Providers: IncredibleEmployment.be  This test is not yet approved or cleared by the [place] FDA and has been authorized for detection and/or diagnosis of SARS-CoV-2 by FDA under an Emergency Use Authorization (EUA). This EUA will remain in effect (meaning this test can be used) for the duration of the COVID-19 declaration under Section 564(b)(1) of the Act, 21 U.S.C. section 360bbb-3(b)(1), unless the authorization is terminated or revoked.  Performed at [facility], [address] [place], [place] [address]   Urine Culture     Status: None   Collection Time: 05/01/20  5:53 AM   Specimen: Urine, Random  Result Value Ref Range Status   Specimen Description   Final    URINE, RANDOM Performed at [facility], [address], [place], [place] [address]    Special Requests   Final    NONE Performed at [facility], [address] [place], [place] [address]    Culture   Final    NO GROWTH Performed at [facility], [place] [address] [place], [place] [address]    Report Status 05/02/2020 FINAL  Final  Culture, blood (Routine X 2) w Reflex to ID Panel     Status: None (Preliminary result)   Collection Time: 05/01/20  5:54 AM   Specimen: BLOOD  Result Value Ref Range Status   Specimen Description BLOOD RIGHT ANTECUBITAL  Final   Special Requests   Final    BOTTLES DRAWN AEROBIC AND ANAEROBIC Blood Culture adequate volume   Culture   Final    NO GROWTH 2 DAYS Performed at  [facility], [address], [place], [place] [address]    Report Status PENDING  Incomplete  Culture, blood (Routine X 2) w Reflex to  ID Panel     Status: None (Preliminary result)   Collection Time: 05/01/20  6:10 AM   Specimen: BLOOD  Result Value Ref Range Status   Specimen Description BLOOD RIGHT ANTECUBITAL  Final   Special Requests   Final    BOTTLES DRAWN AEROBIC AND ANAEROBIC Blood Culture adequate volume   Culture   Final    NO GROWTH 2 DAYS Performed at [facility], [address], [place], [place] [address]    Report Status PENDING  Incomplete         Radiology Studies: DG Chest Port 1 View  Result Date: 05/02/2020 CLINICAL DATA:  Respiratory failure. EXAM: PORTABLE CHEST 1 VIEW COMPARISON:  Chest x-ray 05/01/2020 FINDINGS: Lines and tubes are noted to overlie the patient. The heart size and mediastinal contours are within normal limits. No focal consolidation. No pulmonary edema. No pleural effusion. No pneumothorax. No acute osseous abnormality. IMPRESSION: No active disease. Electronically Signed   By: Iven Finn M.D.   On: 05/02/2020 05:52        Scheduled Meds: . benztropine  0.5 mg Oral BID  . divalproex  500 mg Oral TID  . enoxaparin (LOVENOX) injection  40 mg Subcutaneous Q24H  . feeding supplement  237 mL Oral BID BM  . ferrous sulfate  325 mg Oral Q breakfast  . haloperidol  5 mg Oral BID  . midodrine  10 mg Oral TID AC   Continuous Infusions: . thiamine injection Stopped (05/02/20 0941)     LOS: 2 days    Time spent: 35 mins,More than 50% of that time was spent in counseling and/or coordination of care.      Shelly Coss, MD [facility] P3/13/2022, 2:31 PM

## 2020-05-03 NOTE — Consult Note (Signed)
Addendum @2 :46pm:  Clozapine 25mg  PO daily restarted. Recommended Pharmacy Clozapine monitoring. Recommended CBC + differential with ANC monitoring daily for now.  Psychiatry will follow.

## 2020-05-04 LAB — CBC WITH DIFFERENTIAL/PLATELET
Abs Immature Granulocytes: 0.03 10*3/uL (ref 0.00–0.07)
Basophils Absolute: 0 10*3/uL (ref 0.0–0.1)
Basophils Relative: 1 %
Eosinophils Absolute: 0.1 10*3/uL (ref 0.0–0.5)
Eosinophils Relative: 2 %
HCT: 37.3 % — ABNORMAL LOW (ref 39.0–52.0)
Hemoglobin: 11.8 g/dL — ABNORMAL LOW (ref 13.0–17.0)
Immature Granulocytes: 1 %
Lymphocytes Relative: 20 %
Lymphs Abs: 1.2 10*3/uL (ref 0.7–4.0)
MCH: 29.3 pg (ref 26.0–34.0)
MCHC: 31.6 g/dL (ref 30.0–36.0)
MCV: 92.6 fL (ref 80.0–100.0)
Monocytes Absolute: 0.4 10*3/uL (ref 0.1–1.0)
Monocytes Relative: 8 %
Neutro Abs: 4 10*3/uL (ref 1.7–7.7)
Neutrophils Relative %: 68 %
Platelets: 162 10*3/uL (ref 150–400)
RBC: 4.03 MIL/uL — ABNORMAL LOW (ref 4.22–5.81)
RDW: 14.4 % (ref 11.5–15.5)
WBC: 5.8 10*3/uL (ref 4.0–10.5)
nRBC: 0 % (ref 0.0–0.2)

## 2020-05-04 NOTE — Progress Notes (Signed)
PROGRESS NOTE    Austin Gates  VQX:[id number] DOB: 02/10/1961 DOA: 04/30/2020 PCP: Bonnita Nasuti, MD   Chief Complain: Aggressive behavior, altered mental status  Brief Narrative: Patient is a 60 year old male with history of seizures affective disorder, bipolar type, aggressive compulsive disorder, pancytopenia, iron deficiency anemia, intellectual disability who presented with aggressive behavior, altered mental status.Patient was just released from jail.  psychiatry was consulted from ED and he was initially recommended for outpatient psychiatric treatment.  In the emergency department, he was also found to be hypoxic in room air, hypothermic.  ABG showed hypoxia.  Lab work showed pancytopenia.  Patient was suspected of sepsis and was started on broad-spectrum antibiotics.  Psychiatry has been following.  Overall status is improving.  Psychiatry is not recommending inpatient psych admission.  Medically stable for discharge.  Discharge is pending due to problem with  disposition..  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  CT head negative for acute intracranial abnormalities.  Not taking psychiatric medications at home.  Psychiatry following, not recommended inpatient psych admission.  Home psychiatric medication regimen.  Continue to monitor mental status.  Currently he is not aggressive and currently alert and oriented.  On Cogentin, Depakote, Haldol.  Also started on thiamine.  Psychiatry recommended  to start on clozapine.  SIRS: Met SIRS criteria.  Patient was hypoxic,  tachycardic, hypothermic on presentation.  Chest x-ray did not show any pneumonia.  Started on broad-spectrum antibiotics.  Cultures have been sent, so far negative.  We discontinued antibiotics .  Lab work on presentation also showed pancytopenia.. Counts improving.  Acute hypoxic respiratory failure: Resolved.  BNP normal.  Chest x-ray did not show pneumonia.  Currently on room air.  History of hypertension: On midodrine  Iron deficiency anemia: On iron supplementation  Disposition: Has been showing aggressive/belligerent behavior, hypersexuality recently.  Currently lives with sister and she is refusing to take him back at home.  TOC following for disposition.  Could not discharge due to lack of disposition         DVT prophylaxis:Lovenox Code Status: Full Family Communication: None at bedside Status is: Inpatient  Remains inpatient appropriate because:Unsafe d/c plan   Dispo: The patient is from: Home              Anticipated d/c is to: Home              Patient currently is medically stable to d/c.   Difficult to place patient Yes     Consultants: psychiatry  Procedures:None  Antimicrobials:  Anti-infectives (From admission, onward)   Start     Dose/Rate Route Frequency Ordered Stop   05/01/20 1800  vancomycin (VANCOCIN) IVPB 1000 mg/200 mL premix  Status:  Discontinued        1,000 mg 200 mL/hr over 60 Minutes Intravenous Every 12 hours 05/01/20 1420 05/02/20 1444   05/01/20 1430  ceFEPIme (MAXIPIME) 2 g in sodium chloride 0.9 % 100 mL IVPB  Status:  Discontinued        2 g 200 mL/hr over 30 Minutes Intravenous Every 8 hours 05/01/20 1416 05/02/20 1444  05/01/20 0630  vancomycin (VANCOCIN) IVPB 1000 mg/200 mL premix        1,000 mg 200 mL/hr over 60 Minutes Intravenous  Once 05/01/20 0627 05/01/20 0734   05/01/20 0630  ceFEPIme (MAXIPIME) 2 g in sodium chloride 0.9 % 100 mL IVPB        2 g 200 mL/hr over 30 Minutes Intravenous  Once 05/01/20 4742 05/01/20 5956       Subjective: Patient seen and examined the bedside this morning. comfortable.  Eating his breakfast.  He states he wants to go back to home or jail  Objective: Vitals:   05/03/20 2230 05/04/20 0004 05/04/20 0500 05/04/20 0755  BP: 108/72 131/89  129/86  Pulse: 89 89  81  Resp: 18 20  (!) 22  Temp: 98.3 F (36.8 C) 98.1 F (36.7 C)  97.9 F (36.6 C)  TempSrc: Oral Oral  Oral  SpO2: 94% 100%  100%  Weight:   (P) 72.7 kg     Intake/Output Summary (Last 24 hours) at 05/04/2020 1231 Last data filed at 05/04/2020 0932 Gross per 24 hour  Intake 1440 ml  Output --  Net 1440 ml   Filed Weights   05/02/20 0509 05/04/20 0500  Weight: 70.1 kg (P) 72.7 kg    Examination:  General exam: Overall comfortable, not in distress HEENT: PERRL Respiratory system:  no wheezes or crackles  Cardiovascular system: S1 & S2 heard, RRR.  Gastrointestinal system: Abdomen is nondistended, soft and nontender. Central nervous system: Alert and oriented Extremities: No edema, no clubbing ,no cyanosis Skin: No rashes, no ulcers,no icterus     Data Reviewed: I have personally reviewed following labs and imaging studies  CBC: Recent Labs  Lab 04/30/20 1151 04/30/20 1155 05/01/20 0553 05/01/20 1056 05/02/20 0554 05/03/20 0522 05/04/20 0803  WBC 3.9*   < > 1.9* 3.9* 3.8* 4.0 5.8  NEUTROABS 2.4  --  0.7*  --  2.9 2.6 4.0  HGB 10.9*   < > 12.4* 10.5* 10.9* 10.2* 11.8*  HCT 35.5*   < > 39.9 33.0* 34.1* 32.4* 37.3*  MCV 93.4   < > 93.2 91.2 91.7 92.3 92.6  PLT 219   < > 164 141* 143* 141* 162   < > = values in this interval not displayed.   Basic Metabolic Panel: Recent Labs  Lab 04/29/20 1711 04/30/20 1155 05/01/20 0553 05/01/20 1056 05/02/20 0554 05/03/20 0522  NA 139 139 144  --  144  --   K 4.3 4.2 3.8  --  3.5  --   CL 107 105 108  --  109  --   CO2 '24 29 29  ' --  30  --   GLUCOSE 111* 75 95  --  83  --   BUN 34* 22* 16  --  12  --   CREATININE 0.83 0.85 0.87 0.74 0.78  0.96  CALCIUM 8.8* 9.1 9.5  --  8.8*  --   MG  --   --   --   --  1.9  --   PHOS  --   --   --   --  3.5  --    GFR: Estimated Creatinine Clearance: 84.2 mL/min (by C-G formula based on SCr of 0.96 mg/dL). Liver Function Tests: Recent Labs  Lab 04/29/20 1711 04/30/20 1155 05/01/20 0553  AST '19 18 22  ' ALT '14 14 16  ' ALKPHOS 60 57 56  BILITOT 0.4 0.4 0.6  PROT 7.4 7.4 7.5  ALBUMIN  3.5 3.6 3.6   No results for input(s): LIPASE, AMYLASE in the last 168 hours. Recent Labs  Lab 05/01/20 0756  AMMONIA 28   Coagulation Profile: No results for input(s): INR, PROTIME in the last 168 hours. Cardiac Enzymes: No results for input(s): CKTOTAL, CKMB, CKMBINDEX, TROPONINI in the last 168 hours. BNP (last 3 results) No results for input(s): PROBNP in the last 8760 hours. HbA1C: No results for input(s): HGBA1C in the last 72 hours. CBG: Recent Labs  Lab 05/01/20 0518  GLUCAP 91   Lipid Profile: No results for input(s): CHOL, HDL, LDLCALC, TRIG, CHOLHDL, LDLDIRECT in the last 72 hours. Thyroid Function Tests: No results for input(s): TSH, T4TOTAL, FREET4, T3FREE, THYROIDAB in the last 72 hours. Anemia Panel: No results for input(s): VITAMINB12, FOLATE, FERRITIN, TIBC, IRON, RETICCTPCT in the last 72 hours. Sepsis Labs: Recent Labs  Lab 05/01/20 0553  PROCALCITON <0.10  LATICACIDVEN 1.4    Recent Results (from the past 240 hour(s))  Resp Panel by RT-PCR (Flu A&B, Covid) Nasopharyngeal Swab     Status: None   Collection Time: 04/29/20  5:56 PM   Specimen: Nasopharyngeal Swab; Nasopharyngeal(NP) swabs in vial transport medium  Result Value Ref Range Status   SARS Coronavirus 2 by RT PCR NEGATIVE NEGATIVE Final    Comment: (NOTE) SARS-CoV-2 target nucleic acids are NOT DETECTED.  The SARS-CoV-2 RNA is generally detectable in upper respiratory specimens during the acute phase of infection. The lowest concentration of SARS-CoV-2 viral copies this assay can detect is 138  copies/mL. A negative result does not preclude SARS-Cov-2 infection and should not be used as the sole basis for treatment or other patient management decisions. A negative result may occur with  improper specimen collection/handling, submission of specimen other than nasopharyngeal swab, presence of viral mutation(s) within the areas targeted by this assay, and inadequate number of viral copies(<138 copies/mL). A negative result must be combined with clinical observations, patient history, and epidemiological information. The expected result is Negative.  Fact Sheet for Patients:  EntrepreneurPulse.com.au  Fact Sheet for Healthcare Providers:  IncredibleEmployment.be  This test is no t yet approved or cleared by the [place] FDA and  has been authorized for detection and/or diagnosis of SARS-CoV-2 by FDA under an Emergency Use Authorization (EUA). This EUA will remain  in effect (meaning this test can be used) for the duration of the COVID-19 declaration under Section 564(b)(1) of the Act, 21 U.S.C.section 360bbb-3(b)(1), unless the authorization is terminated  or revoked sooner.       Influenza A by PCR NEGATIVE NEGATIVE Final   Influenza B by PCR NEGATIVE NEGATIVE Final    Comment: (NOTE) The Xpert Xpress SARS-CoV-2/FLU/RSV plus assay is intended as an aid in the diagnosis of influenza from Nasopharyngeal swab specimens and should not be used as a sole basis for treatment. Nasal washings and aspirates are unacceptable for Xpert Xpress SARS-CoV-2/FLU/RSV testing.  Fact Sheet for Patients: EntrepreneurPulse.com.au  Fact Sheet for Healthcare Providers: IncredibleEmployment.be  This test is not yet approved or cleared by the [place] FDA and has been authorized for detection and/or diagnosis of SARS-CoV-2 by FDA under an Emergency Use Authorization (EUA). This EUA will remain in effect (meaning  this test can be used) for the duration of the COVID-19 declaration under Section 564(b)(1) of the Act, 21 U.S.C. section 360bbb-3(b)(1), unless the authorization is terminated or revoked.  Performed at [facility], [address], [place], [place] [address]   Resp Panel by RT-PCR (Flu A&B,  Covid) Nasopharyngeal Swab     Status: None   Collection Time: 04/30/20  1:59 PM   Specimen: Nasopharyngeal Swab; Nasopharyngeal(NP) swabs in vial transport medium  Result Value Ref Range Status   SARS Coronavirus 2 by RT PCR NEGATIVE NEGATIVE Final    Comment: (NOTE) SARS-CoV-2 target nucleic acids are NOT DETECTED.  The SARS-CoV-2 RNA is generally detectable in upper respiratory specimens during the acute phase of infection. The lowest concentration of SARS-CoV-2 viral copies this assay can detect is 138 copies/mL. A negative result does not preclude SARS-Cov-2 infection and should not be used as the sole basis for treatment or other patient management decisions. A negative result may occur with  improper specimen collection/handling, submission of specimen other than nasopharyngeal swab, presence of viral mutation(s) within the areas targeted by this assay, and inadequate number of viral copies(<138 copies/mL). A negative result must be combined with clinical observations, patient history, and epidemiological information. The expected result is Negative.  Fact Sheet for Patients:  EntrepreneurPulse.com.au  Fact Sheet for Healthcare Providers:  IncredibleEmployment.be  This test is no t yet approved or cleared by the [place] FDA and  has been authorized for detection and/or diagnosis of SARS-CoV-2 by FDA under an Emergency Use Authorization (EUA). This EUA will remain  in effect (meaning this test can be used) for the duration of the COVID-19 declaration under Section 564(b)(1) of the Act, 21 U.S.C.section 360bbb-3(b)(1), unless the  authorization is terminated  or revoked sooner.       Influenza A by PCR NEGATIVE NEGATIVE Final   Influenza B by PCR NEGATIVE NEGATIVE Final    Comment: (NOTE) The Xpert Xpress SARS-CoV-2/FLU/RSV plus assay is intended as an aid in the diagnosis of influenza from Nasopharyngeal swab specimens and should not be used as a sole basis for treatment. Nasal washings and aspirates are unacceptable for Xpert Xpress SARS-CoV-2/FLU/RSV testing.  Fact Sheet for Patients: EntrepreneurPulse.com.au  Fact Sheet for Healthcare Providers: IncredibleEmployment.be  This test is not yet approved or cleared by the [place] FDA and has been authorized for detection and/or diagnosis of SARS-CoV-2 by FDA under an Emergency Use Authorization (EUA). This EUA will remain in effect (meaning this test can be used) for the duration of the COVID-19 declaration under Section 564(b)(1) of the Act, 21 U.S.C. section 360bbb-3(b)(1), unless the authorization is terminated or revoked.  Performed at [facility], [address], [place], [place] [address]   Urine Culture     Status: None   Collection Time: 05/01/20  5:53 AM   Specimen: Urine, Random  Result Value Ref Range Status   Specimen Description   Final    URINE, RANDOM Performed at [facility], [address], [place], [place] [address]    Special Requests   Final    NONE Performed at [facility], [address] [place], [place] [address]    Culture   Final    NO GROWTH Performed at [facility], [place] [address] [place], [place] [address]    Report Status 05/02/2020 FINAL  Final  Culture, blood (Routine X 2) w Reflex to ID Panel     Status: None (Preliminary result)   Collection Time: 05/01/20  5:54 AM   Specimen: BLOOD  Result Value Ref Range Status   Specimen Description BLOOD RIGHT ANTECUBITAL  Final   Special Requests   Final    BOTTLES DRAWN AEROBIC AND  ANAEROBIC Blood Culture adequate volume   Culture   Final  NO GROWTH 3 DAYS Performed at [facility], [address] [place], [place] [address]    Report Status PENDING  Incomplete  Culture, blood (Routine X 2) w Reflex to ID Panel     Status: None (Preliminary result)   Collection Time: 05/01/20  6:10 AM   Specimen: BLOOD  Result Value Ref Range Status   Specimen Description BLOOD RIGHT ANTECUBITAL  Final   Special Requests   Final    BOTTLES DRAWN AEROBIC AND ANAEROBIC Blood Culture adequate volume   Culture   Final    NO GROWTH 3 DAYS Performed at [facility], [address] [place], [place] [address]    Report Status PENDING  Incomplete         Radiology Studies: No results found.      Scheduled Meds: . benztropine  0.5 mg Oral BID  . cloZAPine  25 mg Oral Daily  . divalproex  500 mg Oral TID  . enoxaparin (LOVENOX) injection  40 mg Subcutaneous Q24H  . feeding supplement  237 mL Oral BID BM  . ferrous sulfate  325 mg Oral Q breakfast  . haloperidol  5 mg Oral BID  . midodrine  10 mg Oral TID AC   Continuous Infusions: . thiamine injection 500 mg (05/04/20 1005)     LOS: 3 days    Time spent: 15 mins,More than 50% of that time was spent in counseling and/or coordination of care.      Shelly Coss, MD [facility] P3/14/2022, 12:31 PM

## 2020-05-04 NOTE — Progress Notes (Signed)
Pharmacy - Clozapine     This patient's order has been reviewed for prescribing contraindications.   Labs:    Date ANC Dose 3/10   2400 50mg  3/11  700 DC'ed 3/12 2900    3/13 2600 25mg  3/14 4000 25mg   Restarting at a lower dose of 25mg  - will be monitoring ANC daily for now  The medication is being dispensed pursuant to the FDA REMS suspension order of 01/10/20 that allows for dispensing without a patient REMS dispense authorization (RDA).    Darnelle Bos, PharmD Clinical Pharmacist 05/04/2020 9:08 AM

## 2020-05-04 NOTE — Consult Note (Signed)
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 male patient admitted with "when can I go home?".  HPI: Follow-up this patient with intellectual disability.  Patient's only concern to me today was when he could be discharged.  Denied suicidal or homicidal ideas.  Denied hallucinations.  Affect is the usual constricted.  So far behavior has been adequate.  No violence reported  Past Psychiatric History: See previous.  Long history of chronic mental health and behavior problems  Risk to Self:   Risk to Others:   Prior Inpatient Therapy:   Prior Outpatient Therapy:    Past Medical History:  Past Medical History:  Diagnosis Date  . Hypertension   . Intellectual disability   . Obsessive-compulsive disorder   . Schizo-affective schizophrenia [facility])     Past Surgical History:  Procedure Laterality Date  . COLONOSCOPY WITH PROPOFOL N/A 12/28/2017   Procedure: COLONOSCOPY WITH PROPOFOL;  Surgeon: Jonathon Bellows, MD;  Location: [facility] ENDOSCOPY;  Service: Gastroenterology;  Laterality: N/A;  . HYDROCELE EXCISION Bilateral 02/22/2018   Procedure: bilateral HYDROCELECTOMY ADULT;  Surgeon: Cleon Gustin, MD;  Location: [place];  Service: Urology;  Laterality: Bilateral;  . INSERTION OF ILIAC STENT  Left 02/22/2018   Procedure: INSERTION LEFT SUPERIOR FEMORAL ARTERY USING 6MM X 5CM VIABHON STENT with mynx device closure on right femoral artery;  Surgeon: Waynetta Sandy, MD;  Location: [place];  Service: Vascular;  Laterality: Left;  . KNEE ARTHROSCOPY    . SCROTAL EXPLORATION N/A 02/22/2018   Procedure: SCROTUM EXPLORATION;  Surgeon: Cleon Gustin, MD;  Location: [place];  Service: Urology;  Laterality: N/A;  . UPPER EXTREMITY ANGIOGRAM Left 02/22/2018   Procedure: left lower EXTREMITY ANGIOGRAM;  Surgeon: Waynetta Sandy, MD;  Location: [facility] OR;  Service: Vascular;  Laterality: Left;   Family History:  Family History  Problem Relation Age of Onset  . Diabetes Mother    Family Psychiatric  History: See previous Social History:  Social History   Substance and Sexual Activity  Alcohol Use No     Social History   Substance and Sexual Activity  Drug Use No    Social History   Socioeconomic History  . Marital status: Single    Spouse name: Not on file  . Number of children: Not on file  . Years of education: Not on file  . Highest education level: Not on file  Occupational History  . Occupation: Disabled  Tobacco Use  . Smoking status: Former Smoker    Types: Cigarettes  . Smokeless tobacco: Never Used  Vaping Use  . Vaping Use: Unknown  Substance and Sexual Activity  . Alcohol use: No  . Drug use: No  . Sexual activity: Never  Other Topics Concern  . Not on file  Social History Narrative   ** Merged History Encounter **       Social Determinants  of Health   Financial Resource Strain: Not on file  Food Insecurity: Not on file  Transportation Needs: Not on file  Physical Activity: Not on file  Stress: Not on file  Social Connections: Not on file   Additional Social History:    Allergies:  No Known Allergies  Labs:  Results for orders placed or performed during the hospital encounter of 04/30/20 (from the past 48 hour(s))  CBC with  Differential/Platelet     Status: Abnormal   Collection Time: 05/03/20  5:22 AM  Result Value Ref Range   WBC 4.0 4.0 - 10.5 K/uL   RBC 3.51 (L) 4.22 - 5.81 MIL/uL   Hemoglobin 10.2 (L) 13.0 - 17.0 g/dL   HCT 32.4 (L) 39.0 - 52.0 %   MCV 92.3 80.0 - 100.0 fL   MCH 29.1 26.0 - 34.0 pg   MCHC 31.5 30.0 - 36.0 g/dL   RDW 14.3 11.5 - 15.5 %   Platelets 141 (L) 150 - 400 K/uL   nRBC 0.0 0.0 - 0.2 %   Neutrophils Relative % 66 %   Neutro Abs 2.6 1.7 - 7.7 K/uL   Lymphocytes Relative 24 %   Lymphs Abs 1.0 0.7 - 4.0 K/uL   Monocytes Relative 8 %   Monocytes Absolute 0.3 0.1 - 1.0 K/uL   Eosinophils Relative 1 %   Eosinophils Absolute 0.0 0.0 - 0.5 K/uL   Basophils Relative 1 %   Basophils Absolute 0.0 0.0 - 0.1 K/uL   Immature Granulocytes 0 %   Abs Immature Granulocytes 0.01 0.00 - 0.07 K/uL    Comment: Performed at [facility], [address] [place], [place] [address]  Creatinine, serum     Status: None   Collection Time: 05/03/20  5:22 AM  Result Value Ref Range   Creatinine, Ser 0.96 0.61 - 1.24 mg/dL   GFR, Estimated >60 >60 mL/min    Comment: (NOTE) Calculated using the CKD-EPI Creatinine Equation (2021) Performed at [facility], [address] [place], [place] [address]   CBC with Differential/Platelet     Status: Abnormal   Collection Time: 05/04/20  8:03 AM  Result Value Ref Range   WBC 5.8 4.0 - 10.5 K/uL   RBC 4.03 (L) 4.22 - 5.81 MIL/uL   Hemoglobin 11.8 (L) 13.0 - 17.0 g/dL   HCT 37.3 (L) 39.0 - 52.0 %   MCV 92.6 80.0 - 100.0 fL   MCH 29.3 26.0 - 34.0 pg   MCHC 31.6 30.0 - 36.0 g/dL   RDW 14.4 11.5 - 15.5 %   Platelets 162 150 - 400 K/uL   nRBC 0.0 0.0 - 0.2 %   Neutrophils Relative % 68 %   Neutro Abs 4.0 1.7 - 7.7 K/uL   Lymphocytes Relative 20 %   Lymphs Abs 1.2 0.7 - 4.0 K/uL   Monocytes Relative 8 %   Monocytes Absolute 0.4 0.1 - 1.0 K/uL   Eosinophils Relative 2 %   Eosinophils Absolute 0.1 0.0 - 0.5 K/uL   Basophils  Relative 1 %   Basophils Absolute 0.0 0.0 - 0.1 K/uL   Immature Granulocytes 1 %   Abs Immature Granulocytes 0.03 0.00 - 0.07 K/uL    Comment: Performed at [facility], [address] [place], [place] [address]    Current Facility-Administered Medications  Medication Dose Route Frequency Provider Last Rate Last Admin  . albuterol (VENTOLIN HFA) 108 (90 Base) MCG/ACT inhaler 2 puff  2 puff Inhalation Q4H PRN  Ivor Costa, MD      . benztropine (COGENTIN) tablet 0.5 mg  0.5 mg Oral BID Ivor Costa, MD   0.5 mg at 05/04/20 0950  . cloZAPine (CLOZARIL) tablet 25 mg  25 mg Oral Daily Larita Fife, MD   25 mg at 05/04/20 0949  . divalproex (DEPAKOTE) DR tablet 500 mg  500 mg Oral TID Ivor Costa, MD   500 mg at 05/04/20 0950  . docusate sodium (COLACE) capsule 100 mg  100 mg Oral BID PRN Tyler Pita, MD      . enoxaparin (LOVENOX) injection 40 mg  40 mg Subcutaneous Q24H Tyler Pita, MD   40 mg at 05/03/20 2249  . feeding supplement (ENSURE ENLIVE / ENSURE PLUS) liquid 237 mL  237 mL Oral BID BM Ivor Costa, MD   237 mL at 05/04/20 1609  . ferrous sulfate tablet 325 mg  325 mg Oral Q breakfast Ivor Costa, MD   325 mg at 05/04/20 0945  . food thickener (THICK IT) powder   Oral PRN Ivor Costa, MD      . haloperidol (HALDOL) tablet 5 mg  5 mg Oral BID Saphyra Hutt T, MD   5 mg at 05/03/20 2248  . haloperidol lactate (HALDOL) injection 5 mg  5 mg Intravenous Q6H PRN Sharion Settler, NP   5 mg at 05/03/20 0817  . LORazepam (ATIVAN) injection 1 mg  1 mg Intravenous Q6H PRN Shelly Coss, MD   1 mg at 05/04/20 1610  . melatonin tablet 5 mg  5 mg Oral QHS PRN Dorothe Pea, RPH   5 mg at 05/01/20 2102  . midodrine (PROAMATINE) tablet 10 mg  10 mg Oral TID Renella Cunas, MD   10 mg at 05/04/20 0945  . ondansetron (ZOFRAN) injection 4 mg  4 mg Intravenous Q6H PRN Tyler Pita, MD      . polyethylene glycol (MIRALAX / GLYCOLAX) packet 17 g  17 g Oral Daily PRN Tyler Pita, MD      . thiamine 500mg  in normal saline (55ml) IVPB  500 mg Intravenous Daily Tyler Pita, MD 100 mL/hr at 05/04/20 1005 500 mg at 05/04/20 1005    Musculoskeletal: Strength & Muscle Tone: within normal limits Gait & Station: unsteady Patient leans: N/A            Psychiatric Specialty Exam:  Presentation  General Appearance: No data recorded Eye Contact:No data recorded Speech:No data recorded Speech Volume:No data recorded Handedness:No data recorded  Mood and Affect  Mood:No data recorded Affect:No data recorded  Thought Process  Thought Processes:No data recorded Descriptions of Associations:No data recorded Orientation:No data recorded Thought Content:No data recorded History of Schizophrenia/Schizoaffective disorder:No data recorded Duration of Psychotic Symptoms:No data recorded Hallucinations:No data recorded Ideas of Reference:No data recorded Suicidal Thoughts:No data recorded Homicidal Thoughts:No data recorded  Sensorium  Memory:No data recorded Judgment:No data recorded Insight:No data recorded  Executive Functions  Concentration:No data recorded Attention Span:No data recorded Recall:No data recorded Fund of Knowledge:No data recorded Language:No data recorded  Psychomotor Activity  Psychomotor Activity:No data recorded  Assets  Assets:No data recorded  Sleep  Sleep:No data recorded  Physical Exam: Physical Exam Vitals and nursing note reviewed.  Constitutional:      Appearance: Normal appearance.  HENT:     Head: Normocephalic and atraumatic.     Mouth/Throat:     Pharynx: Oropharynx is clear.  Eyes:     Pupils: Pupils are equal, round, and  reactive to light.  Cardiovascular:     Rate and Rhythm: Normal rate and regular rhythm.  Pulmonary:     Effort: Pulmonary effort is normal.     Breath sounds: Normal breath sounds.  Abdominal:     General: Abdomen is flat.     Palpations: Abdomen is soft.   Musculoskeletal:        General: Normal range of motion.  Skin:    General: Skin is warm and dry.  Neurological:     General: No focal deficit present.     Mental Status: He is alert. Mental status is at baseline.  Psychiatric:        Attention and Perception: He is inattentive.        Mood and Affect: Mood normal. Affect is blunt.        Speech: Speech is tangential.        Behavior: Behavior is slowed.        Thought Content: Thought content normal. Thought content does not include homicidal or suicidal ideation.        Cognition and Memory: Cognition is impaired. Memory is impaired.        Judgment: Judgment is impulsive.    Review of Systems  Constitutional: Negative.   HENT: Negative.   Eyes: Negative.   Respiratory: Negative.   Cardiovascular: Negative.   Gastrointestinal: Negative.   Musculoskeletal: Negative.   Skin: Negative.   Neurological: Negative.   Psychiatric/Behavioral: Negative for depression, hallucinations, memory loss, substance abuse and suicidal ideas. The patient is not nervous/anxious and does not have insomnia.    Blood pressure 114/80, pulse 83, temperature 97.9 F (36.6 C), resp. rate 17, weight (P) 72.7 kg, SpO2 99 %. Body mass index is 22.35 kg/m (pended).  Treatment Plan Summary: Plan Patient had low ANC just before the weekend so we discontinued his clozapine.  ANC level has come back up into the normal range but his behavior right now is adequate and rather than rechallenging with clozapine and I will give him longer to recover.  No change in orders for medicine today.  Continue to support plans to get him placed in an appropriate group home.  Disposition: No evidence of imminent risk to self or others at present.   Patient does not meet criteria for psychiatric inpatient admission. Supportive therapy provided about ongoing stressors.  Alethia Berthold, MD 05/04/2020 6:14 PM

## 2020-05-04 NOTE — Plan of Care (Signed)
  Problem: Education: °Goal: Knowledge of General Education information will improve °Description: Including pain rating scale, medication(s)/side effects and non-pharmacologic comfort measures °Outcome: Not Progressing °  °Problem: Health Behavior/Discharge Planning: °Goal: Ability to manage health-related needs will improve °Outcome: Not Progressing °  °Problem: Clinical Measurements: °Goal: Ability to maintain clinical measurements within normal limits will improve °Outcome: Not Progressing °Goal: Will remain free from infection °Outcome: Not Progressing °Goal: Diagnostic test results will improve °Outcome: Not Progressing °Goal: Respiratory complications will improve °Outcome: Not Progressing °Goal: Cardiovascular complication will be avoided °Outcome: Not Progressing °  °Problem: Nutrition: °Goal: Adequate nutrition will be maintained °Outcome: Not Progressing °  °Problem: Coping: °Goal: Level of anxiety will decrease °Outcome: Not Progressing °  °Problem: Elimination: °Goal: Will not experience complications related to bowel motility °Outcome: Not Progressing °Goal: Will not experience complications related to urinary retention °Outcome: Not Progressing °  °Problem: Pain Managment: °Goal: General experience of comfort will improve °Outcome: Not Progressing °  °Problem: Safety: °Goal: Ability to remain free from injury will improve °Outcome: Not Progressing °  °

## 2020-05-05 LAB — CBC WITH DIFFERENTIAL/PLATELET
Abs Immature Granulocytes: 0.03 10*3/uL (ref 0.00–0.07)
Basophils Absolute: 0 10*3/uL (ref 0.0–0.1)
Basophils Relative: 1 %
Eosinophils Absolute: 0.1 10*3/uL (ref 0.0–0.5)
Eosinophils Relative: 2 %
HCT: 33.3 % — ABNORMAL LOW (ref 39.0–52.0)
Hemoglobin: 10.6 g/dL — ABNORMAL LOW (ref 13.0–17.0)
Immature Granulocytes: 1 %
Lymphocytes Relative: 18 %
Lymphs Abs: 0.8 10*3/uL (ref 0.7–4.0)
MCH: 29.2 pg (ref 26.0–34.0)
MCHC: 31.8 g/dL (ref 30.0–36.0)
MCV: 91.7 fL (ref 80.0–100.0)
Monocytes Absolute: 0.4 10*3/uL (ref 0.1–1.0)
Monocytes Relative: 9 %
Neutro Abs: 2.9 10*3/uL (ref 1.7–7.7)
Neutrophils Relative %: 69 %
Platelets: 156 10*3/uL (ref 150–400)
RBC: 3.63 MIL/uL — ABNORMAL LOW (ref 4.22–5.81)
RDW: 14.4 % (ref 11.5–15.5)
WBC: 4.1 10*3/uL (ref 4.0–10.5)
nRBC: 0 % (ref 0.0–0.2)

## 2020-05-05 MED ORDER — NICOTINE 21 MG/24HR TD PT24
21.0000 mg | MEDICATED_PATCH | Freq: Every day | TRANSDERMAL | Status: DC
  Administered 2020-05-05 – 2020-05-07 (×3): 21 mg via TRANSDERMAL
  Filled 2020-05-05 (×3): qty 1
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
## 2020-05-06 DIAGNOSIS — I951 Orthostatic hypotension: Secondary | ICD-10-CM

## 2020-05-06 LAB — CBC WITH DIFFERENTIAL/PLATELET
Abs Immature Granulocytes: 0.07 10*3/uL (ref 0.00–0.07)
Basophils Absolute: 0.1 10*3/uL (ref 0.0–0.1)
Basophils Relative: 1 %
Eosinophils Absolute: 0.1 10*3/uL (ref 0.0–0.5)
Eosinophils Relative: 2 %
HCT: 38.3 % — ABNORMAL LOW (ref 39.0–52.0)
Hemoglobin: 12 g/dL — ABNORMAL LOW (ref 13.0–17.0)
Immature Granulocytes: 2 %
Lymphocytes Relative: 23 %
Lymphs Abs: 1.1 10*3/uL (ref 0.7–4.0)
MCH: 28.7 pg (ref 26.0–34.0)
MCHC: 31.3 g/dL (ref 30.0–36.0)
MCV: 91.6 fL (ref 80.0–100.0)
Monocytes Absolute: 0.6 10*3/uL (ref 0.1–1.0)
Monocytes Relative: 13 %
Neutro Abs: 2.8 10*3/uL (ref 1.7–7.7)
Neutrophils Relative %: 59 %
Platelets: 173 10*3/uL (ref 150–400)
RBC: 4.18 MIL/uL — ABNORMAL LOW (ref 4.22–5.81)
RDW: 14.3 % (ref 11.5–15.5)
WBC: 4.7 10*3/uL (ref 4.0–10.5)
nRBC: 0.4 % — ABNORMAL HIGH (ref 0.0–0.2)

## 2020-05-06 LAB — CULTURE, BLOOD (ROUTINE X 2)
Culture: NO GROWTH
Culture: NO GROWTH
Special Requests: ADEQUATE
Special Requests: ADEQUATE

## 2020-05-06 MED ORDER — ZIPRASIDONE MESYLATE 20 MG IM SOLR
20.0000 mg | Freq: Once | INTRAMUSCULAR | Status: AC
  Administered 2020-05-06: 20 mg via INTRAMUSCULAR
  Filled 2020-05-06: qty 20
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
## 2020-05-06 NOTE — Progress Notes (Signed)
Patient ID: Austin Gates, male   DOB: 01/13/1961, 60 y.o.   MRN: [id number] [facility] PROGRESS NOTE  Austin Gates TKW:[id number] DOB: 01/08/1961 DOA: 04/30/2020 PCP: Bonnita Nasuti, MD  HPI/Subjective: Patient understands that he cannot act out like he did last night early morning.  Patient asked to go home.  Spoke with the patient's sister who is also his guardian on the phone that he needs mental health treatment at this point.  The patient states he feels okay and offers no complaints when I asked him questions.  Objective: Vitals:   05/06/20 0845 05/06/20 1257  BP: (!) 134/94 (!) 126/92  Pulse: 90 (!) 117  Resp: 20 20  Temp: 97.6 F (36.4 C) 97.7 F (36.5 C)  SpO2: 100% 100%    Intake/Output Summary (Last 24 hours) at 05/06/2020 1446 Last data filed at 05/05/2020 2200 Gross per 24 hour  Intake 480 ml  Output -  Net 480 ml   Filed Weights   05/02/20 0509 05/04/20 0500  Weight: 70.1 kg (P) 72.7 kg    ROS: Review of Systems  Respiratory: Negative for shortness of breath.   Cardiovascular: Negative for chest pain.  Gastrointestinal: Negative for abdominal pain, nausea and vomiting.   Exam: Physical Exam HENT:     Head: Normocephalic.     Mouth/Throat:     Pharynx: No oropharyngeal exudate.  Eyes:     General: Lids are normal.     Conjunctiva/sclera: Conjunctivae normal.  Cardiovascular:     Rate and Rhythm: Normal rate and regular rhythm.     Heart sounds: Normal heart sounds, S1 normal and S2 normal.  Pulmonary:     Breath sounds: No decreased breath sounds, wheezing, rhonchi or rales.  Abdominal:     Palpations: Abdomen is soft.     Tenderness: There is no abdominal tenderness.  Musculoskeletal:     Right lower leg: No swelling.     Left lower leg: No swelling.  Skin:    General: Skin is warm.     Findings: No rash.  Neurological:     Mental Status: He is alert.       Data Reviewed: Basic Metabolic Panel: Recent Labs  Lab  04/29/20 1711 04/30/20 1155 05/01/20 0553 05/01/20 1056 05/02/20 0554 05/03/20 0522  NA 139 139 144  --  144  --   K 4.3 4.2 3.8  --  3.5  --   CL 107 105 108  --  109  --   CO2 24 29 29   --  30  --   GLUCOSE 111* 75 95  --  83  --   BUN 34* 22* 16  --  12  --   CREATININE 0.83 0.85 0.87 0.74 0.78 0.96  CALCIUM 8.8* 9.1 9.5  --  8.8*  --   MG  --   --   --   --  1.9  --   PHOS  --   --   --   --  3.5  --    Liver Function Tests: Recent Labs  Lab 04/29/20 1711 04/30/20 1155 05/01/20 0553  AST 19 18 22   ALT 14 14 16   ALKPHOS 60 57 56  BILITOT 0.4 0.4 0.6  PROT 7.4 7.4 7.5  ALBUMIN 3.5 3.6 3.6    Recent Labs  Lab 05/01/20 0756  AMMONIA 28   CBC: Recent Labs  Lab 05/02/20 0554 05/03/20 0522 05/04/20 0803 05/05/20 0743 05/06/20 0819  WBC 3.8* 4.0 5.8 4.1 4.7  NEUTROABS 2.9 2.6 4.0 2.9 2.8  HGB 10.9* 10.2* 11.8* 10.6* 12.0*  HCT 34.1* 32.4* 37.3* 33.3* 38.3*  MCV 91.7 92.3 92.6 91.7 91.6  PLT 143* 141* 162 156 173   BNP (last 3 results) Recent Labs    05/01/20 1056  BNP 78.5     CBG: Recent Labs  Lab 05/01/20 0518  GLUCAP 91    Recent Results (from the past 240 hour(s))  Resp Panel by RT-PCR (Flu A&B, Covid) Nasopharyngeal Swab     Status: None   Collection Time: 04/29/20  5:56 PM   Specimen: Nasopharyngeal Swab; Nasopharyngeal(NP) swabs in vial transport medium  Result Value Ref Range Status   SARS Coronavirus 2 by RT PCR NEGATIVE NEGATIVE Final    Comment: (NOTE) SARS-CoV-2 target nucleic acids are NOT DETECTED.  The SARS-CoV-2 RNA is generally detectable in upper respiratory specimens during the acute phase of infection. The lowest concentration of SARS-CoV-2 viral copies this assay can detect is 138 copies/mL. A negative result does not preclude SARS-Cov-2 infection and should not be used as the sole basis for treatment or other patient management decisions. A negative result may occur with  improper specimen collection/handling,  submission of specimen other than nasopharyngeal swab, presence of viral mutation(s) within the areas targeted by this assay, and inadequate number of viral copies(<138 copies/mL). A negative result must be combined with clinical observations, patient history, and epidemiological information. The expected result is Negative.  Fact Sheet for Patients:  EntrepreneurPulse.com.au  Fact Sheet for Healthcare Providers:  IncredibleEmployment.be  This test is no t yet approved or cleared by the [place] FDA and  has been authorized for detection and/or diagnosis of SARS-CoV-2 by FDA under an Emergency Use Authorization (EUA). This EUA will remain  in effect (meaning this test can be used) for the duration of the COVID-19 declaration under Section 564(b)(1) of the Act, 21 U.S.C.section 360bbb-3(b)(1), unless the authorization is terminated  or revoked sooner.       Influenza A by PCR NEGATIVE NEGATIVE Final   Influenza B by PCR NEGATIVE NEGATIVE Final    Comment: (NOTE) The Xpert Xpress SARS-CoV-2/FLU/RSV plus assay is intended as an aid in the diagnosis of influenza from Nasopharyngeal swab specimens and should not be used as a sole basis for treatment. Nasal washings and aspirates are unacceptable for Xpert Xpress SARS-CoV-2/FLU/RSV testing.  Fact Sheet for Patients: EntrepreneurPulse.com.au  Fact Sheet for Healthcare Providers: IncredibleEmployment.be  This test is not yet approved or cleared by the [place] FDA and has been authorized for detection and/or diagnosis of SARS-CoV-2 by FDA under an Emergency Use Authorization (EUA). This EUA will remain in effect (meaning this test can be used) for the duration of the COVID-19 declaration under Section 564(b)(1) of the Act, 21 U.S.C. section 360bbb-3(b)(1), unless the authorization is terminated or revoked.  Performed at [facility], [address] [place], [place] [address]   Resp Panel by RT-PCR (Flu A&B, Covid) Nasopharyngeal Swab     Status: None   Collection Time: 04/30/20  1:59 PM   Specimen: Nasopharyngeal Swab; Nasopharyngeal(NP) swabs in vial transport medium  Result Value Ref Range Status   SARS Coronavirus 2 by RT PCR NEGATIVE NEGATIVE Final    Comment: (NOTE) SARS-CoV-2 target nucleic acids are NOT DETECTED.  The SARS-CoV-2 RNA is generally detectable in upper respiratory specimens during the acute phase of infection. The lowest concentration of SARS-CoV-2 viral copies this assay can detect is 138 copies/mL. A negative result does not  preclude SARS-Cov-2 infection and should not be used as the sole basis for treatment or other patient management decisions. A negative result may occur with  improper specimen collection/handling, submission of specimen other than nasopharyngeal swab, presence of viral mutation(s) within the areas targeted by this assay, and inadequate number of viral copies(<138 copies/mL). A negative result must be combined with clinical observations, patient history, and epidemiological information. The expected result is Negative.  Fact Sheet for Patients:  EntrepreneurPulse.com.au  Fact Sheet for Healthcare Providers:  IncredibleEmployment.be  This test is no t yet approved or cleared by the [place] FDA and  has been authorized for detection and/or diagnosis of SARS-CoV-2 by FDA under an Emergency Use Authorization (EUA). This EUA will remain  in effect (meaning this test can be used) for the duration of the COVID-19 declaration under Section 564(b)(1) of the Act, 21 U.S.C.section 360bbb-3(b)(1), unless the authorization is terminated  or revoked sooner.       Influenza A by PCR NEGATIVE NEGATIVE Final   Influenza B by PCR NEGATIVE NEGATIVE Final    Comment: (NOTE) The Xpert Xpress SARS-CoV-2/FLU/RSV plus assay is intended as an  aid in the diagnosis of influenza from Nasopharyngeal swab specimens and should not be used as a sole basis for treatment. Nasal washings and aspirates are unacceptable for Xpert Xpress SARS-CoV-2/FLU/RSV testing.  Fact Sheet for Patients: EntrepreneurPulse.com.au  Fact Sheet for Healthcare Providers: IncredibleEmployment.be  This test is not yet approved or cleared by the [place] FDA and has been authorized for detection and/or diagnosis of SARS-CoV-2 by FDA under an Emergency Use Authorization (EUA). This EUA will remain in effect (meaning this test can be used) for the duration of the COVID-19 declaration under Section 564(b)(1) of the Act, 21 U.S.C. section 360bbb-3(b)(1), unless the authorization is terminated or revoked.  Performed at [facility], [address] [place], [place] [address]   Urine Culture     Status: None   Collection Time: 05/01/20  5:53 AM   Specimen: Urine, Random  Result Value Ref Range Status   Specimen Description   Final    URINE, RANDOM Performed at [facility], [address] [place], [place] [address]    Special Requests   Final    NONE Performed at [facility], [address], [place], [place] [address]    Culture   Final    NO GROWTH Performed at [facility], [place] [address], [place], [place] [address]    Report Status 05/02/2020 FINAL  Final  Culture, blood (Routine X 2) w Reflex to ID Panel     Status: None   Collection Time: 05/01/20  5:54 AM   Specimen: BLOOD  Result Value Ref Range Status   Specimen Description BLOOD RIGHT ANTECUBITAL  Final   Special Requests   Final    BOTTLES DRAWN AEROBIC AND ANAEROBIC Blood Culture adequate volume   Culture   Final    NO GROWTH 5 DAYS Performed at [facility], [address], [place], [place] [address]    Report Status 05/06/2020 FINAL  Final  Culture, blood (Routine X 2) w Reflex to ID Panel      Status: None   Collection Time: 05/01/20  6:10 AM   Specimen: BLOOD  Result Value Ref Range Status   Specimen Description BLOOD RIGHT ANTECUBITAL  Final   Special Requests   Final    BOTTLES DRAWN AEROBIC AND ANAEROBIC Blood Culture adequate volume   Culture  Final    NO GROWTH 5 DAYS Performed at [facility], [address] [place], [place] [address]    Report Status 05/06/2020 FINAL  Final     Scheduled Meds: . benztropine  0.5 mg Oral BID  . cloZAPine  25 mg Oral Daily  . divalproex  500 mg Oral TID  . enoxaparin (LOVENOX) injection  40 mg Subcutaneous Q24H  . feeding supplement  237 mL Oral BID BM  . ferrous sulfate  325 mg Oral Q breakfast  . haloperidol  5 mg Oral BID  . midodrine  10 mg Oral TID AC  . nicotine  21 mg Transdermal Daily    Assessment/Plan:  1. Acute metabolic encephalopathy with aggressive behavior.  History of schizoaffective disorder.  Patient on low-dose clozapine, benztropine, Depakote, Haldol.  Psychiatry following. 2. SIRS present on admission with hypoxia, tachycardia hypothermic.  Patient was initially on antibiotics but stopped since cultures are negative. 3. Pancytopenia may be secondary to clozapine initially. 4. Acute hypoxic respiratory failure this has resolved.  Pulse ox 88% and 75% on room air on 05/01/2020.  Current pulse ox 100% on room air. 5. Orthostatic hypotension on midodrine 6. Physical therapy evaluation        Code Status:     Code Status Orders  (From admission, onward)         Start     Ordered   05/01/20 0956  Full code  Continuous        05/01/20 1001        Code Status History    Date Active Date Inactive Code Status Order ID Comments User Context   04/30/2020 1321 05/01/2020 1001 Full Code [id number]  Duffy Bruce, MD ED   04/29/2020 1746 04/30/2020 0253 Full Code [id number]  Naaman Plummer, MD ED   08/31/2018 2126 10/04/2018 1235 Full Code [id number]  Patrecia Pour, NP Inpatient   02/22/2018 0904  03/13/2018 1210 Full Code [id number]  Georganna Skeans, MD ED   02/16/2018 1131 02/17/2018 1533 Full Code [id number]  Jola Schmidt, MD ED   11/16/2017 0018 11/24/2017 2119 Full Code [id number]  Amelia Jo, MD Inpatient   09/29/2016 2043 10/07/2016 1514 Full Code [id number]  Gonzella Lex, MD Inpatient   09/17/2016 1901 09/18/2016 1556 Full Code [id number]  Demetrios Loll, MD Inpatient   Advance Care Planning Activity     Family Communication: Spoke with sisters on the phone Disposition Plan: Status is: Inpatient  Dispo: The patient is from: Home              Anticipated d/c is to: Psychiatric unit versus group home depending on how he is doing.              Patient currently undergoing psychiatric treatment with medications while here in the hospital   Difficult to place patient.  Yes.  Consultants:  Psychiatry  Time spent: 28 minutes  [place]
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
## 2020-05-06 NOTE — Progress Notes (Signed)
Pt is still restless and agitated. He is pacing the room "cleaning" the floor. Rn gives the pt haldol , as ordered. No complaints or hitting from the pt.
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
## 2020-05-07 ENCOUNTER — Other Ambulatory Visit: Payer: Self-pay

## 2020-05-07 ENCOUNTER — Encounter: Payer: Self-pay | Admitting: Psychiatry

## 2020-05-07 ENCOUNTER — Inpatient Hospital Stay
Admission: RE | Admit: 2020-05-07 | Discharge: 2020-06-15 | DRG: 885 | Disposition: A | Discharge status: Home / Self Care | Payer: Medicaid Other | Source: Intra-hospital | Attending: Behavioral Health | Admitting: Behavioral Health

## 2020-05-07 DIAGNOSIS — Z20822 Contact with and (suspected) exposure to covid-19: Secondary | ICD-10-CM | POA: Diagnosis present

## 2020-05-07 DIAGNOSIS — F250 Schizoaffective disorder, bipolar type: Principal | ICD-10-CM | POA: Diagnosis present

## 2020-05-07 DIAGNOSIS — Z720 Tobacco use: Secondary | ICD-10-CM

## 2020-05-07 DIAGNOSIS — I959 Hypotension, unspecified: Secondary | ICD-10-CM | POA: Diagnosis present

## 2020-05-07 DIAGNOSIS — K5901 Slow transit constipation: Secondary | ICD-10-CM | POA: Diagnosis present

## 2020-05-07 DIAGNOSIS — F17200 Nicotine dependence, unspecified, uncomplicated: Secondary | ICD-10-CM | POA: Diagnosis present

## 2020-05-07 DIAGNOSIS — F71 Moderate intellectual disabilities: Secondary | ICD-10-CM | POA: Diagnosis present

## 2020-05-07 DIAGNOSIS — R6510 Systemic inflammatory response syndrome (SIRS) of non-infectious origin without acute organ dysfunction: Secondary | ICD-10-CM | POA: Diagnosis not present

## 2020-05-07 DIAGNOSIS — J45909 Unspecified asthma, uncomplicated: Secondary | ICD-10-CM | POA: Diagnosis present

## 2020-05-07 DIAGNOSIS — R4689 Other symptoms and signs involving appearance and behavior: Secondary | ICD-10-CM | POA: Diagnosis not present

## 2020-05-07 DIAGNOSIS — R471 Dysarthria and anarthria: Secondary | ICD-10-CM | POA: Diagnosis present

## 2020-05-07 DIAGNOSIS — R1310 Dysphagia, unspecified: Secondary | ICD-10-CM | POA: Diagnosis present

## 2020-05-07 DIAGNOSIS — D61818 Other pancytopenia: Secondary | ICD-10-CM | POA: Diagnosis not present

## 2020-05-07 DIAGNOSIS — K219 Gastro-esophageal reflux disease without esophagitis: Secondary | ICD-10-CM | POA: Diagnosis present

## 2020-05-07 DIAGNOSIS — G9341 Metabolic encephalopathy: Secondary | ICD-10-CM | POA: Diagnosis not present

## 2020-05-07 LAB — CBC WITH DIFFERENTIAL/PLATELET
Abs Immature Granulocytes: 0.35 10*3/uL — ABNORMAL HIGH (ref 0.00–0.07)
Basophils Absolute: 0.1 10*3/uL (ref 0.0–0.1)
Basophils Relative: 1 %
Eosinophils Absolute: 0.1 10*3/uL (ref 0.0–0.5)
Eosinophils Relative: 2 %
HCT: 35.4 % — ABNORMAL LOW (ref 39.0–52.0)
Hemoglobin: 11.6 g/dL — ABNORMAL LOW (ref 13.0–17.0)
Immature Granulocytes: 6 %
Lymphocytes Relative: 23 %
Lymphs Abs: 1.4 10*3/uL (ref 0.7–4.0)
MCH: 29.7 pg (ref 26.0–34.0)
MCHC: 32.8 g/dL (ref 30.0–36.0)
MCV: 90.8 fL (ref 80.0–100.0)
Monocytes Absolute: 0.7 10*3/uL (ref 0.1–1.0)
Monocytes Relative: 11 %
Neutro Abs: 3.4 10*3/uL (ref 1.7–7.7)
Neutrophils Relative %: 57 %
Platelets: 159 10*3/uL (ref 150–400)
RBC: 3.9 MIL/uL — ABNORMAL LOW (ref 4.22–5.81)
RDW: 14.4 % (ref 11.5–15.5)
Smear Review: NORMAL
WBC Morphology: ABNORMAL
WBC: 5.9 10*3/uL (ref 4.0–10.5)
nRBC: 1.2 % — ABNORMAL HIGH (ref 0.0–0.2)

## 2020-05-07 LAB — RESP PANEL BY RT-PCR (FLU A&B, COVID) ARPGX2
Influenza A by PCR: NEGATIVE
Influenza B by PCR: NEGATIVE
SARS Coronavirus 2 by RT PCR: NEGATIVE

## 2020-05-07 MED ORDER — NICOTINE 21 MG/24HR TD PT24
21.0000 mg | MEDICATED_PATCH | Freq: Every day | TRANSDERMAL | Status: DC
  Administered 2020-05-08 – 2020-06-15 (×38): 21 mg via TRANSDERMAL
  Filled 2020-05-07 (×38): qty 1

## 2020-05-07 MED ORDER — FERROUS SULFATE 325 (65 FE) MG PO TABS
325.0000 mg | ORAL_TABLET | Freq: Every day | ORAL | Status: DC
  Administered 2020-05-08 – 2020-06-15 (×39): 325 mg via ORAL
  Filled 2020-05-07 (×39): qty 1

## 2020-05-07 MED ORDER — ALUM & MAG HYDROXIDE-SIMETH 200-200-20 MG/5ML PO SUSP
30.0000 mL | ORAL | Status: DC | PRN
  Administered 2020-05-20 – 2020-06-01 (×6): 30 mL via ORAL
  Filled 2020-05-07 (×6): qty 30

## 2020-05-07 MED ORDER — LORAZEPAM 2 MG/ML IJ SOLN: 2.0000 mg | INTRAMUSCULAR | Status: DC | PRN

## 2020-05-07 MED ORDER — HALOPERIDOL 5 MG PO TABS
10.0000 mg | ORAL_TABLET | Freq: Two times a day (BID) | ORAL | Status: DC
  Administered 2020-05-07 – 2020-06-15 (×78): 10 mg via ORAL
  Filled 2020-05-07 (×81): qty 2

## 2020-05-07 MED ORDER — CLOZAPINE 25 MG PO TABS
50.0000 mg | ORAL_TABLET | Freq: Every day | ORAL | Status: DC
  Administered 2020-05-08 – 2020-05-10 (×3): 50 mg via ORAL
  Filled 2020-05-07 (×3): qty 2

## 2020-05-07 MED ORDER — ENSURE ENLIVE PO LIQD
237.0000 mL | Freq: Two times a day (BID) | ORAL | Status: DC
  Administered 2020-05-08 – 2020-05-21 (×22): 237 mL via ORAL

## 2020-05-07 MED ORDER — CLOZAPINE 25 MG PO TABS: 50.0000 mg | ORAL_TABLET | Freq: Every day | ORAL | Status: DC

## 2020-05-07 MED ORDER — STARCH (THICKENING) PO POWD
ORAL | Status: DC | PRN
  Filled 2020-05-07: qty 227

## 2020-05-07 MED ORDER — BENZTROPINE MESYLATE 1 MG PO TABS
0.5000 mg | ORAL_TABLET | Freq: Two times a day (BID) | ORAL | Status: DC
  Administered 2020-05-07 – 2020-06-15 (×78): 0.5 mg via ORAL
  Filled 2020-05-07 (×78): qty 1

## 2020-05-07 MED ORDER — ZIPRASIDONE MESYLATE 20 MG IM SOLR
20.0000 mg | Freq: Once | INTRAMUSCULAR | Status: AC
  Administered 2020-05-07: 20 mg via INTRAMUSCULAR
  Filled 2020-05-07: qty 20

## 2020-05-07 MED ORDER — CLONAZEPAM 0.5 MG PO TABS
0.5000 mg | ORAL_TABLET | Freq: Three times a day (TID) | ORAL | Status: DC
  Administered 2020-05-07: 0.5 mg via ORAL
  Filled 2020-05-07: qty 1

## 2020-05-07 MED ORDER — DIVALPROEX SODIUM 500 MG PO DR TAB
500.0000 mg | DELAYED_RELEASE_TABLET | Freq: Three times a day (TID) | ORAL | Status: DC
  Administered 2020-05-08 – 2020-05-12 (×13): 500 mg via ORAL
  Filled 2020-05-07 (×13): qty 1

## 2020-05-07 MED ORDER — MAGNESIUM HYDROXIDE 400 MG/5ML PO SUSP: 30.0000 mL | Freq: Every day | ORAL | Status: DC | PRN

## 2020-05-07 MED ORDER — DOCUSATE SODIUM 100 MG PO CAPS
100.0000 mg | ORAL_CAPSULE | Freq: Two times a day (BID) | ORAL | Status: DC | PRN
  Administered 2020-05-17: 100 mg via ORAL
  Filled 2020-05-07: qty 1

## 2020-05-07 MED ORDER — TRAZODONE HCL 100 MG PO TABS
100.0000 mg | ORAL_TABLET | Freq: Every evening | ORAL | Status: DC | PRN
  Administered 2020-05-07 – 2020-06-15 (×18): 100 mg via ORAL
  Filled 2020-05-07 (×21): qty 1

## 2020-05-07 MED ORDER — MIDODRINE HCL 5 MG PO TABS
10.0000 mg | ORAL_TABLET | Freq: Three times a day (TID) | ORAL | Status: DC
  Administered 2020-05-08 – 2020-06-15 (×98): 10 mg via ORAL
  Filled 2020-05-07 (×103): qty 2

## 2020-05-07 MED ORDER — LORAZEPAM 2 MG/ML IJ SOLN
2.0000 mg | INTRAMUSCULAR | Status: DC | PRN
  Administered 2020-05-07 – 2020-05-09 (×3): 2 mg via INTRAMUSCULAR
  Filled 2020-05-07 (×5): qty 1

## 2020-05-07 MED ORDER — ACETAMINOPHEN 325 MG PO TABS
650.0000 mg | ORAL_TABLET | Freq: Four times a day (QID) | ORAL | Status: DC | PRN
Start: 2020-05-07 — End: 2020-06-15
  Filled 2020-05-07: qty 2

## 2020-05-07 MED ORDER — CLONAZEPAM 0.5 MG PO TABS
0.5000 mg | ORAL_TABLET | Freq: Three times a day (TID) | ORAL | Status: DC
  Administered 2020-05-07 – 2020-05-09 (×5): 0.5 mg via ORAL
  Filled 2020-05-07 (×5): qty 1

## 2020-05-07 MED ORDER — MELATONIN 5 MG PO TABS
5.0000 mg | ORAL_TABLET | Freq: Every evening | ORAL | Status: DC | PRN
  Administered 2020-05-07 – 2020-06-12 (×15): 5 mg via ORAL
  Filled 2020-05-07 (×17): qty 1

## 2020-05-07 MED ORDER — POLYETHYLENE GLYCOL 3350 17 G PO PACK: 17.0000 g | PACK | Freq: Every day | ORAL | Status: DC | PRN

## 2020-05-07 MED ORDER — ALBUTEROL SULFATE HFA 108 (90 BASE) MCG/ACT IN AERS
2.0000 | INHALATION_SPRAY | RESPIRATORY_TRACT | Status: DC | PRN
  Filled 2020-05-07: qty 6.7

## 2020-05-07 MED ORDER — HALOPERIDOL 5 MG PO TABS
10.0000 mg | ORAL_TABLET | Freq: Two times a day (BID) | ORAL | Status: DC
  Filled 2020-05-07: qty 2
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
## 2020-05-07 NOTE — Progress Notes (Signed)
Patient ID: Austin Gates, male   DOB: 10-05-1960, 60 y.o.   MRN: [id number] [facility] PROGRESS NOTE  Trelon Plush ION:[id number] DOB: 1960/06/23 DOA: 04/30/2020 PCP: Bonnita Nasuti, MD  HPI/Subjective: The patient was continuously laughing when I was seeing him today. Cooperative with exam and answering questions.  The patient states he wants to go home.  This afternoon the patient again was aggressive towards the staff.  Haldol was given.  I ordered a dose of Geodon.  Objective: Vitals:   05/07/20 0057 05/07/20 0750  BP: 137/75 (!) 124/91  Pulse: (!) 109 (!) 102  Resp: 17 18  Temp: 97.8 F (36.6 C) 98.3 F (36.8 C)  SpO2: 98% 100%    Intake/Output Summary (Last 24 hours) at 05/07/2020 1503 Last data filed at 05/06/2020 1850 Gross per 24 hour  Intake 900 ml  Output --  Net 900 ml   Filed Weights   05/02/20 0509 05/04/20 0500  Weight: 70.1 kg (P) 72.7 kg    ROS: Review of Systems  Unable to perform ROS: Psychiatric disorder  Respiratory: Negative for shortness of breath.   Cardiovascular: Negative for chest pain.  Gastrointestinal: Negative for abdominal pain.   Exam: Physical Exam HENT:     Head: Normocephalic.     Mouth/Throat:     Comments: When I saw him he still had some cereal in his tongue. Eyes:     General: Lids are normal.     Conjunctiva/sclera: Conjunctivae normal.  Cardiovascular:     Rate and Rhythm: Normal rate and regular rhythm.     Heart sounds: Normal heart sounds, S1 normal and S2 normal.  Pulmonary:     Breath sounds: Normal breath sounds. No decreased breath sounds, wheezing, rhonchi or rales.  Abdominal:     Palpations: Abdomen is soft.     Tenderness: There is no abdominal tenderness.  Musculoskeletal:     Right ankle: No swelling.     Left ankle: No swelling.  Skin:    General: Skin is warm.     Findings: No rash.  Neurological:     Mental Status: He is alert.     Comments: Patient answers a few questions.  States he  feels okay.       Data Reviewed: Basic Metabolic Panel: Recent Labs  Lab 05/01/20 0553 05/01/20 1056 05/02/20 0554 05/03/20 0522  NA 144  --  144  --   K 3.8  --  3.5  --   CL 108  --  109  --   CO2 29  --  30  --   GLUCOSE 95  --  83  --   BUN 16  --  12  --   CREATININE 0.87 0.74 0.78 0.96  CALCIUM 9.5  --  8.8*  --   MG  --   --  1.9  --   PHOS  --   --  3.5  --    Liver Function Tests: Recent Labs  Lab 05/01/20 0553  AST 22  ALT 16  ALKPHOS 56  BILITOT 0.6  PROT 7.5  ALBUMIN 3.6    Recent Labs  Lab 05/01/20 0756  AMMONIA 28   CBC: Recent Labs  Lab 05/03/20 0522 05/04/20 0803 05/05/20 0743 05/06/20 0819 05/07/20 0745  WBC 4.0 5.8 4.1 4.7 5.9  NEUTROABS 2.6 4.0 2.9 2.8 3.4  HGB 10.2* 11.8* 10.6* 12.0* 11.6*  HCT 32.4* 37.3* 33.3* 38.3* 35.4*  MCV 92.3 92.6 91.7 91.6 90.8  PLT 141*  162 156 173 159   BNP (last 3 results) Recent Labs    05/01/20 1056  BNP 78.5    CBG: Recent Labs  Lab 05/01/20 0518  GLUCAP 91    Recent Results (from the past 240 hour(s))  Resp Panel by RT-PCR (Flu A&B, Covid) Nasopharyngeal Swab     Status: None   Collection Time: 04/29/20  5:56 PM   Specimen: Nasopharyngeal Swab; Nasopharyngeal(NP) swabs in vial transport medium  Result Value Ref Range Status   SARS Coronavirus 2 by RT PCR NEGATIVE NEGATIVE Final    Comment: (NOTE) SARS-CoV-2 target nucleic acids are NOT DETECTED.  The SARS-CoV-2 RNA is generally detectable in upper respiratory specimens during the acute phase of infection. The lowest concentration of SARS-CoV-2 viral copies this assay can detect is 138 copies/mL. A negative result does not preclude SARS-Cov-2 infection and should not be used as the sole basis for treatment or other patient management decisions. A negative result may occur with  improper specimen collection/handling, submission of specimen other than nasopharyngeal swab, presence of viral mutation(s) within the areas targeted by  this assay, and inadequate number of viral copies(<138 copies/mL). A negative result must be combined with clinical observations, patient history, and epidemiological information. The expected result is Negative.  Fact Sheet for Patients:  EntrepreneurPulse.com.au  Fact Sheet for Healthcare Providers:  IncredibleEmployment.be  This test is no t yet approved or cleared by the [place] FDA and  has been authorized for detection and/or diagnosis of SARS-CoV-2 by FDA under an Emergency Use Authorization (EUA). This EUA will remain  in effect (meaning this test can be used) for the duration of the COVID-19 declaration under Section 564(b)(1) of the Act, 21 U.S.C.section 360bbb-3(b)(1), unless the authorization is terminated  or revoked sooner.       Influenza A by PCR NEGATIVE NEGATIVE Final   Influenza B by PCR NEGATIVE NEGATIVE Final    Comment: (NOTE) The Xpert Xpress SARS-CoV-2/FLU/RSV plus assay is intended as an aid in the diagnosis of influenza from Nasopharyngeal swab specimens and should not be used as a sole basis for treatment. Nasal washings and aspirates are unacceptable for Xpert Xpress SARS-CoV-2/FLU/RSV testing.  Fact Sheet for Patients: EntrepreneurPulse.com.au  Fact Sheet for Healthcare Providers: IncredibleEmployment.be  This test is not yet approved or cleared by the [place] FDA and has been authorized for detection and/or diagnosis of SARS-CoV-2 by FDA under an Emergency Use Authorization (EUA). This EUA will remain in effect (meaning this test can be used) for the duration of the COVID-19 declaration under Section 564(b)(1) of the Act, 21 U.S.C. section 360bbb-3(b)(1), unless the authorization is terminated or revoked.  Performed at [facility], [address] [place], [place] [address]   Resp Panel by RT-PCR (Flu A&B, Covid) Nasopharyngeal Swab     Status: None    Collection Time: 04/30/20  1:59 PM   Specimen: Nasopharyngeal Swab; Nasopharyngeal(NP) swabs in vial transport medium  Result Value Ref Range Status   SARS Coronavirus 2 by RT PCR NEGATIVE NEGATIVE Final    Comment: (NOTE) SARS-CoV-2 target nucleic acids are NOT DETECTED.  The SARS-CoV-2 RNA is generally detectable in upper respiratory specimens during the acute phase of infection. The lowest concentration of SARS-CoV-2 viral copies this assay can detect is 138 copies/mL. A negative result does not preclude SARS-Cov-2 infection and should not be used as the sole basis for treatment or other patient management decisions. A negative result may occur with  improper specimen collection/handling, submission of  specimen other than nasopharyngeal swab, presence of viral mutation(s) within the areas targeted by this assay, and inadequate number of viral copies(<138 copies/mL). A negative result must be combined with clinical observations, patient history, and epidemiological information. The expected result is Negative.  Fact Sheet for Patients:  EntrepreneurPulse.com.au  Fact Sheet for Healthcare Providers:  IncredibleEmployment.be  This test is no t yet approved or cleared by the [place] FDA and  has been authorized for detection and/or diagnosis of SARS-CoV-2 by FDA under an Emergency Use Authorization (EUA). This EUA will remain  in effect (meaning this test can be used) for the duration of the COVID-19 declaration under Section 564(b)(1) of the Act, 21 U.S.C.section 360bbb-3(b)(1), unless the authorization is terminated  or revoked sooner.       Influenza A by PCR NEGATIVE NEGATIVE Final   Influenza B by PCR NEGATIVE NEGATIVE Final    Comment: (NOTE) The Xpert Xpress SARS-CoV-2/FLU/RSV plus assay is intended as an aid in the diagnosis of influenza from Nasopharyngeal swab specimens and should not be used as a sole basis for treatment.  Nasal washings and aspirates are unacceptable for Xpert Xpress SARS-CoV-2/FLU/RSV testing.  Fact Sheet for Patients: EntrepreneurPulse.com.au  Fact Sheet for Healthcare Providers: IncredibleEmployment.be  This test is not yet approved or cleared by the [place] FDA and has been authorized for detection and/or diagnosis of SARS-CoV-2 by FDA under an Emergency Use Authorization (EUA). This EUA will remain in effect (meaning this test can be used) for the duration of the COVID-19 declaration under Section 564(b)(1) of the Act, 21 U.S.C. section 360bbb-3(b)(1), unless the authorization is terminated or revoked.  Performed at [facility], [address] [place], [place] [address]   Urine Culture     Status: None   Collection Time: 05/01/20  5:53 AM   Specimen: Urine, Random  Result Value Ref Range Status   Specimen Description   Final    URINE, RANDOM Performed at [facility], [address] [place], [place] [address]    Special Requests   Final    NONE Performed at [facility], [address], [place], [place] [address]    Culture   Final    NO GROWTH Performed at [facility], [place] [address], [place], [place] [address]    Report Status 05/02/2020 FINAL  Final  Culture, blood (Routine X 2) w Reflex to ID Panel     Status: None   Collection Time: 05/01/20  5:54 AM   Specimen: BLOOD  Result Value Ref Range Status   Specimen Description BLOOD RIGHT ANTECUBITAL  Final   Special Requests   Final    BOTTLES DRAWN AEROBIC AND ANAEROBIC Blood Culture adequate volume   Culture   Final    NO GROWTH 5 DAYS Performed at [facility], [address], [place], [place] [address]    Report Status 05/06/2020 FINAL  Final  Culture, blood (Routine X 2) w Reflex to ID Panel     Status: None   Collection Time: 05/01/20  6:10 AM   Specimen: BLOOD  Result Value Ref Range Status   Specimen Description  BLOOD RIGHT ANTECUBITAL  Final   Special Requests   Final    BOTTLES DRAWN AEROBIC AND ANAEROBIC Blood Culture adequate volume   Culture   Final    NO GROWTH 5 DAYS Performed at [facility], [address] [place], [place] [address]    Report Status 05/06/2020 FINAL  Final  Scheduled Meds: . benztropine  0.5 mg Oral BID  . cloZAPine  25 mg Oral Daily  . divalproex  500 mg Oral TID  . enoxaparin (LOVENOX) injection  40 mg Subcutaneous Q24H  . feeding supplement  237 mL Oral BID BM  . ferrous sulfate  325 mg Oral Q breakfast  . haloperidol  5 mg Oral BID  . midodrine  10 mg Oral TID AC  . nicotine  21 mg Transdermal Daily    Assessment/Plan:  1. Acute metabolic encephalopathy with aggressive behavior.  History of schizoaffective disorder.  Patient currently on low-dose clozapine, benztropine, Depakote and Haldol.  On as needed IV Haldol.  Had a give a dose of IM Geodon.  Patient will likely be a difficult placement with aggressive behavior. 2. SIRS on presentation.  Patient had hypoxia tachycardia and was hypothermic.  Cultures are negative so antibiotics were stopped. 3. Pancytopenia likely secondary to clozapine 4. Acute hypoxic respiratory failure which has resolved.  Patient did have a pulse ox of 88% and 75% on room air on 05/01/2020. 5. Orthostatic hypotension on midodrine        Code Status:     Code Status Orders  (From admission, onward)         Start     Ordered   05/01/20 0956  Full code  Continuous        05/01/20 1001        Code Status History    Date Active Date Inactive Code Status Order ID Comments User Context   04/30/2020 1321 05/01/2020 1001 Full Code [id number]  Duffy Bruce, MD ED   04/29/2020 1746 04/30/2020 0253 Full Code [id number]  Naaman Plummer, MD ED   08/31/2018 2126 10/04/2018 1235 Full Code [id number]  Patrecia Pour, NP Inpatient   02/22/2018 0904 03/13/2018 1210 Full Code [id number]  Georganna Skeans, MD ED   02/16/2018 1131  02/17/2018 1533 Full Code [id number]  Jola Schmidt, MD ED   11/16/2017 0018 11/24/2017 2119 Full Code [id number]  Amelia Jo, MD Inpatient   09/29/2016 2043 10/07/2016 1514 Full Code [id number]  Gonzella Lex, MD Inpatient   09/17/2016 1901 09/18/2016 1556 Full Code [id number]  Demetrios Loll, MD Inpatient   Advance Care Planning Activity     Family Communication: Spoke with patient's sister on the phone yesterday Disposition Plan: Status is: Inpatient  Dispo: The patient is from: Home              Anticipated d/c is to: Undetermined yet              Patient currently still with aggressive behaviors towards staff.   Difficult to place patient.  Yes  Time spent: 28 minutes  Quinn

## 2020-05-07 NOTE — BH Assessment (Addendum)
Patient is to be admitted to [facility] by Dr. Weber Cooks.  Attending Physician will be Dr. Domingo Cocking.   Patient has been assigned to room 302, by Artas.   Intake Paper Work has been signed and placed on patient chart.   Staff aware of the admission:   Filbert Schilder Patient's Nurse   [facility] Patient Access.

## 2020-05-07 NOTE — Progress Notes (Signed)
   05/06/20 2226  Assess: MEWS Score  Temp 97.8 F (36.6 C)  BP (!) 140/94  Pulse Rate (!) 119  Resp 18  Level of Consciousness Alert  SpO2 97 %  O2 Device Room Air  Assess: MEWS Score  MEWS Temp 0  MEWS Systolic 0  MEWS Pulse 2  MEWS RR 0  MEWS LOC 0  MEWS Score 2  MEWS Score Color Yellow  Assess: if the MEWS score is Yellow or Red  Were vital signs taken at a resting state? No  Focused Assessment No change from prior assessment  Early Detection of Sepsis Score *See Row Information* Low  MEWS guidelines implemented *See Row Information* No, vital signs rechecked  Treat  MEWS Interventions Administered prn meds/treatments  Pain Scale 0-10  Pain Score 0  Take Vital Signs  Increase Vital Sign Frequency  Yellow: Q 2hr X 2 then Q 4hr X 2, if remains yellow, continue Q 4hrs  Escalate  MEWS: Escalate Yellow: discuss with charge nurse/RN and consider discussing with provider and RRT  Notify: Charge Nurse/RN  Name of Charge Nurse/RN Notified Nickola Major, RN  Date Charge Nurse/RN Notified 05/06/20  Time Charge Nurse/RN Notified 2240  Notify: Provider  Provider Name/Title Stark Klein, MD  Date Provider Notified 05/06/20  Time Provider Notified 2340  Notification Type Page  Notification Reason Change in status  Will continue to monitor. Provider notified.

## 2020-05-07 NOTE — Evaluation (Signed)
Physical Therapy Evaluation Patient Details Name: Austin Gates MRN: [id number] DOB: [birth year] Today's Date: 05/07/2020   History of Present Illness  Pt is a 60 year old male with history of seizures affective disorder, bipolar type, aggressive compulsive disorder, pancytopenia, iron deficiency anemia, intellectual disability who presented with aggressive behavior, altered mental status.    Clinical Impression  Pt alert, sitter in room. Pt with some garbled speech a bit hard to understand, but able to slow down and speak clearer when prompted. Pt verbalized several times "I don't want to go back to jail" and "I'll be good". Per CM pt was staying with his sister after recently getting out of jail, has stayed at a group home in the past. Appears to be supervision for all activities at baseline.   Pt with some restlessness and impulsivity but able to follow commands. Bed mobility modI and supervision for transfers and ambulation. He walked ~234ft with supervision, no AD. Occasional gait path deviations noted and 1-2 staggering instances, shuffled step but no LOB noted and no physical assist needed. The patient demonstrated  return to baseline level of functioning, no further acute PT needs indicated, PT to sign off. Please reconsult PT if pt status changes or acute needs are identified.       Follow Up Recommendations Supervision/Assistance - 24 hour    Equipment Recommendations  None recommended by PT    Recommendations for Other Services       Precautions / Restrictions Precautions Precautions: Fall Restrictions Weight Bearing Restrictions: No      Mobility  Bed Mobility Overal bed mobility: Modified Independent                  Transfers Overall transfer level: Needs assistance Equipment used: None Transfers: Sit to/from Stand Sit to Stand: Supervision            Ambulation/Gait Ambulation/Gait assistance: Supervision Gait Distance (Feet): 200 Feet Assistive  device: None       General Gait Details: some gait path deviations noted with ambulation, but no LOB. occasional staggering  Financial trader Rankin (Stroke Patients Only)       Balance Overall balance assessment: Needs assistance Sitting-balance support: Feet supported Sitting balance-Leahy Scale: Normal Sitting balance - Comments: able to don shoes in sitting     Standing balance-Leahy Scale: Good                               Pertinent Vitals/Pain Pain Assessment: Faces Faces Pain Scale: No hurt    Home Living                        Prior Function           Comments: Pt with garbled speech, spoke with CM about PLOF; pt recently from jail, staying with her but she is unable to take him back. Has stayed in a group home in the past.     Hand Dominance        Extremity/Trunk Assessment   Upper Extremity Assessment Upper Extremity Assessment: Overall WFL for tasks assessed    Lower Extremity Assessment Lower Extremity Assessment: Overall WFL for tasks assessed    Cervical / Trunk Assessment Cervical / Trunk Assessment: Normal  Communication      Cognition Arousal/Alertness: Awake/alert Behavior During Therapy: WFL for tasks assessed/performed;Restless Overall Cognitive  Status: Within Functional Limits for tasks assessed                                        General Comments      Exercises     Assessment/Plan    PT Assessment Patent does not need any further PT services  PT Problem List Decreased activity tolerance;Decreased balance;Decreased mobility       PT Treatment Interventions      PT Goals (Current goals can be found in the Care Plan section)       Frequency     Barriers to discharge        Co-evaluation               AM-PAC PT "6 Clicks" Mobility  Outcome Measure Help needed turning from your back to your side while in a flat bed without  using bedrails?: None Help needed moving from lying on your back to sitting on the side of a flat bed without using bedrails?: None Help needed moving to and from a bed to a chair (including a wheelchair)?: None Help needed standing up from a chair using your arms (e.g., wheelchair or bedside chair)?: None Help needed to walk in hospital room?: None Help needed climbing 3-5 steps with a railing? : None 6 Click Score: 24    End of Session   Activity Tolerance: Patient tolerated treatment well Patient left: in chair;with nursing/sitter in room Nurse Communication: Mobility status PT Visit Diagnosis: Other abnormalities of gait and mobility (R26.89)    Time: 9150-5697 PT Time Calculation (min) (ACUTE ONLY): 13 min   Charges:   PT Evaluation $PT Eval Moderate Complexity: 1 Mod         Lieutenant Diego PT, DPT 10:56 AM,05/07/20
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
## 2020-05-07 NOTE — Tx Team (Signed)
Initial Treatment Plan 05/07/2020 10:52 PM [name] BMW:[id number]    PATIENT STRESSORS: Health problems Marital or family conflict   PATIENT STRENGTHS: Supportive family/friends   PATIENT IDENTIFIED PROBLEMS: Aggression                     DISCHARGE CRITERIA:  Improved stabilization in mood, thinking, and/or behavior  PRELIMINARY DISCHARGE PLAN: Outpatient therapy  PATIENT/FAMILY INVOLVEMENT: This treatment plan has been presented to and reviewed with the patient, Austin Gates, and/or family member,  .  The patient and family have been given the opportunity to ask questions and make suggestions.  Austin Parkins, RN 05/07/2020, 10:52 PM

## 2020-05-07 NOTE — Plan of Care (Signed)
°  Problem: Nutrition: °Goal: Adequate nutrition will be maintained °Outcome: Progressing °  °Problem: Coping: °Goal: Level of anxiety will decrease °Outcome: Progressing °  °Problem: Safety: °Goal: Ability to remain free from injury will improve °Outcome: Progressing °  °

## 2020-05-08 DIAGNOSIS — F250 Schizoaffective disorder, bipolar type: Secondary | ICD-10-CM | POA: Diagnosis not present

## 2020-05-08 LAB — CBC
HCT: 31.5 % — ABNORMAL LOW (ref 39.0–52.0)
Hemoglobin: 10.1 g/dL — ABNORMAL LOW (ref 13.0–17.0)
MCH: 29.4 pg (ref 26.0–34.0)
MCHC: 32.1 g/dL (ref 30.0–36.0)
MCV: 91.6 fL (ref 80.0–100.0)
Platelets: 137 10*3/uL — ABNORMAL LOW (ref 150–400)
RBC: 3.44 MIL/uL — ABNORMAL LOW (ref 4.22–5.81)
RDW: 14.7 % (ref 11.5–15.5)
WBC: 5.8 10*3/uL (ref 4.0–10.5)
nRBC: 0.9 % — ABNORMAL HIGH (ref 0.0–0.2)
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
## 2020-05-08 NOTE — Progress Notes (Signed)
Pharmacy - Clozapine     This patient's order has been reviewed for prescribing contraindications.   Labs:    Date ANC Dose 3/10   2400 50mg  3/11  700 DC'ed 3/12 2900    3/13 2600 25mg  3/14 4000 25mg  3/15 2900 3/16 2800  3/17 3400   *transferred to Bed Med from 1A 3/18     - 50 mg ordered   monitoring ANC daily for now  The medication is being dispensed pursuant to the FDA REMS suspension order of 01/10/20 that allows for dispensing without a patient REMS dispense authorization (RDA).    Noralee Space, PharmD Clinical Pharmacist 05/08/2020 7:16 AM

## 2020-05-08 NOTE — Progress Notes (Signed)
Recreation Therapy Notes   Date: 05/08/2020  Time: 9:30 am   Location: Craft room    Behavioral response: N/A   Intervention Topic: Leisure   Discussion/Intervention: Patient did not attend group.   Clinical Observations/Feedback:  Patient did not attend group.   Shaverence [name] LRT/CTRS        Shaverence  [name] 05/08/2020 10:49 AM
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
## 2020-05-08 NOTE — Progress Notes (Signed)
Patient is alert and oriented to self and place. He denies SI, HI, and AVH. Patient is compliant with medications. He denies pain and other physical problems. He states, "I want to go home." Patient is restless and walks around the unit. Patient also attempted to enter another patient's room. Patient 1:1 order was renewed.   Patient remains safe on the unit at this time. Support and encouragement is provided.

## 2020-05-08 NOTE — Progress Notes (Signed)
Patient was incontinent of urine. He changed his clothing and took a shower. Bed linens were changed. Patient is eating lunch and was compliant with 12pm medications. His mood is pleasant. Patient remains safe on the unit at this time.

## 2020-05-08 NOTE — BHH Suicide Risk Assessment (Signed)
[facility] INPATIENT:  Family/Significant Other Suicide Prevention Education  Suicide Prevention Education:  Education Completed; Chief Technology Officer, [name]  (name of family member/significant other) has been identified by the patient as the family member/significant other with whom the patient will be residing, and identified as the person(s) who will aid the patient in the event of a mental health crisis (suicidal ideations/suicide attempt).  With written consent from the patient, the family member/significant other has been provided the following suicide prevention education, prior to the and/or following the discharge of the patient.  The suicide prevention education provided includes the following:  Suicide risk factors  Suicide prevention and interventions  National Suicide Hotline telephone number  [facility] assessment telephone number  [facility] Emergency Assistance [place] and/or Residential Mobile Crisis Unit telephone number  Request made of family/significant other to:  Remove weapons (e.g., guns, rifles, knives), all items previously/currently identified as safety concern.    Remove drugs/medications (over-the-counter, prescriptions, illicit drugs), all items previously/currently identified as a safety concern.  The family member/significant other verbalizes understanding of the suicide prevention education information provided.  The family member/significant other agrees to remove the items of safety concern listed above.  Kiva A Martinique 05/08/2020, 11:41 AM

## 2020-05-08 NOTE — BHH Group Notes (Signed)
LCSW Group Therapy Note  05/08/2020 2:29 PM  Type of Therapy/Topic:  Group Therapy: Emotional Regulation   Participation Level:Active  Description of Group: Milieu was in state of chaos. Group was held outside to utilize coping skills to help deal with strong emotions. Positive recreational activity and mindfulness were practiced to assist with distraction/amelioration of negative emotional outbursts.   Therapeutic Goals: 1. Patient will identify positive activities to help distract/ameliorate negative emotions. 2. Patient will demonstrate positive leisure activity/mindfulness practice.  Summary of Patient Progress: Patient was present for the majority of the group. He participated in basketball activity and spoke with staff while listening to music.   Therapeutic Modalities: Mindfulness Positive leisure  Chalmers Guest. Guerry Bruin, MSW, [place], [place] 05/08/2020 2:29 PM

## 2020-05-08 NOTE — Discharge Summary (Signed)
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 Hypertension   . Intellectual disability   . Obsessive-compulsive disorder   . 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  Acute metabolic encephalopathy with aggressive behavior.  Patient has a history of schizoaffective disorder.  Was on low-dose clozapine, benztropine, Depakote and Haldol.  With aggressive behavior on the medical floor we needed to give IV Haldol and IM Geodon.  Patient will likely be a difficult placement with aggressive behavior.  Patient was discharged to the psychiatric floor late on 05/07/2020. 2.  SIRS on presentation.  Patient had hypoxia, tachycardia and was hypothermic initially.  Cultures were negative so antibiotics were discontinued. 3.  Pancytopenia likely secondary to clozapine.  Continue to watch blood counts on low-dose clozapine. 4.  Acute hypoxic respiratory failure which has resolved.  Patient did have a pulse ox of 88% and 75%  on room air on 05/01/2020.  Currently doing well off oxygen. 5.  Orthostatic hypotension on midodrine  DISCHARGE CONDITIONS:   Fair  CONSULTS OBTAINED:  Psychiatry  DRUG ALLERGIES:  No Known Allergies  DISCHARGE MEDICATIONS:   Allergies as of 05/07/2020   No Known Allergies     Medication List    ASK your doctor about these medications   benztropine 0.5 MG tablet Commonly known as: COGENTIN Take 0.5 mg by mouth 2 (two) times daily.   divalproex 500 MG DR tablet Commonly known as: DEPAKOTE Take 500 mg by mouth 3 (three) times daily. Ask about: Which instructions should I use?   Ensure Take 237 mLs by mouth in the morning and at bedtime.   ferrous sulfate 325 (65 FE) MG tablet Take 325 mg by mouth daily with breakfast.   food thickener Powd Commonly known as: THICK IT Take by mouth in the morning, at noon, and at bedtime.   lactulose 10 GM/15ML solution Commonly known as: CHRONULAC Take 20 g by mouth daily.   levOCARNitine 330 MG tablet Commonly known as: CARNITOR Take 990 mg by mouth 3 (three) times daily. Ask about: Should I take this medication?   melatonin 3 MG Tabs tablet Take 3 mg by mouth at bedtime as needed (sleep).   midodrine 10 MG tablet Commonly known as: PROAMATINE Take 10 mg by mouth 3 (three) times daily.        DISCHARGE INSTRUCTIONS:   Follow-up psychiatry team 1 day  If you experience worsening of your admission symptoms, develop shortness of breath, life threatening emergency, suicidal or homicidal thoughts you must seek medical attention immediately by calling 911 or calling your MD  immediately  if symptoms less severe.  You Must read complete instructions/literature along with all the possible adverse reactions/side effects for all the Medicines you take and that have been prescribed to you. Take any new Medicines after you have completely understood and accept all the possible adverse reactions/side effects.   Please note  You  were cared for by a hospitalist during your hospital stay. If you have any questions about your discharge medications or the care you received while you were in the hospital after you are discharged, you can call the unit and asked to speak with the hospitalist on call if the hospitalist that took care of you is not available. Once you are discharged, your primary care physician will handle any further medical issues. Please note that NO REFILLS for any discharge medications will be authorized once you are discharged, as it is imperative that you return to your primary care physician (or establish a relationship with a primary care physician if you do not have one) for your aftercare needs so that they can reassess your need for medications and monitor your lab values.    Today   CHIEF COMPLAINT:   Chief Complaint  Patient presents with  . medical evaluation    HISTORY OF PRESENT ILLNESS:  Austin Gates  is a 60 y.o. male was admitted initially because of SIRS   VITAL SIGNS:  Blood pressure 129/86, pulse (!) 125, temperature 98.7 F (37.1 C), temperature source Oral, resp. rate 17, weight (P) 72.7 kg, SpO2 100 %.  I/O:    Intake/Output Summary (Last 24 hours) at 05/08/2020 1538 Last data filed at 05/07/2020 1828 Gross per 24 hour  Intake 480 ml  Output --  Net 480 ml     DATA REVIEW:   CBC Recent Labs  Lab 05/08/20 0618  WBC 5.8  HGB 10.1*  HCT 31.5*  PLT 137*    Chemistries  Recent Labs  Lab 05/02/20 0554 05/03/20 0522  NA 144  --   K 3.5  --   CL 109  --   CO2 30  --   GLUCOSE 83  --   BUN 12  --   CREATININE 0.78 0.96  CALCIUM 8.8*  --   MG 1.9  --     Microbiology Results  Results for orders placed or performed during the hospital encounter of 04/30/20  Resp Panel by RT-PCR (Flu A&B, Covid) Nasopharyngeal Swab     Status: None   Collection Time: 04/30/20  1:59 PM   Specimen: Nasopharyngeal Swab; Nasopharyngeal(NP) swabs in vial transport medium   Result Value Ref Range Status   SARS Coronavirus 2 by RT PCR NEGATIVE NEGATIVE Final    Comment: (NOTE) SARS-CoV-2 target nucleic acids are NOT DETECTED.  The SARS-CoV-2 RNA is generally detectable in upper respiratory specimens during the acute phase of infection. The lowest concentration of SARS-CoV-2 viral copies this assay can detect is 138 copies/mL. A negative result does not preclude SARS-Cov-2 infection and should not be used as the sole basis for treatment or other patient management decisions. A negative result may occur with  improper specimen collection/handling, submission of specimen other than nasopharyngeal swab, presence of viral mutation(s) within the areas targeted by this assay, and inadequate number of viral copies(<138 copies/mL). A negative result must be combined with clinical observations, patient history, and epidemiological information. The expected result is Negative.  Fact Sheet for Patients:  EntrepreneurPulse.com.au  Fact Sheet for Healthcare Providers:  IncredibleEmployment.be  This test is no  t yet approved or cleared by the [place] and  has been authorized for detection and/or diagnosis of SARS-CoV-2 by FDA under an Emergency Use Authorization (EUA). This EUA will remain  in effect (meaning this test can be used) for the duration of the COVID-19 declaration under Section 564(b)(1) of the Act, 21 U.S.C.section 360bbb-3(b)(1), unless the authorization is terminated  or revoked sooner.       Influenza A by PCR NEGATIVE NEGATIVE Final   Influenza B by PCR NEGATIVE NEGATIVE Final    Comment: (NOTE) The Xpert Xpress SARS-CoV-2/FLU/RSV plus assay is intended as an aid in the diagnosis of influenza from Nasopharyngeal swab specimens and should not be used as a sole basis for treatment. Nasal washings and aspirates are unacceptable for Xpert Xpress SARS-CoV-2/FLU/RSV testing.  Fact Sheet for  Patients: EntrepreneurPulse.com.au  Fact Sheet for Healthcare Providers: IncredibleEmployment.be  This test is not yet approved or cleared by the [place] FDA and has been authorized for detection and/or diagnosis of SARS-CoV-2 by FDA under an Emergency Use Authorization (EUA). This EUA will remain in effect (meaning this test can be used) for the duration of the COVID-19 declaration under Section 564(b)(1) of the Act, 21 U.S.C. section 360bbb-3(b)(1), unless the authorization is terminated or revoked.  Performed at [facility], [address], [place], [place] [address]   Urine Culture     Status: None   Collection Time: 05/01/20  5:53 AM   Specimen: Urine, Random  Result Value Ref Range Status   Specimen Description   Final    URINE, RANDOM Performed at [facility], [address], [place], [place] [address]    Special Requests   Final    NONE Performed at [facility], [address] [place], [place] [address]    Culture   Final    NO GROWTH Performed at [facility], [place] [address] [place], [place] [address]    Report Status 05/02/2020 FINAL  Final  Culture, blood (Routine X 2) w Reflex to ID Panel     Status: None   Collection Time: 05/01/20  5:54 AM   Specimen: BLOOD  Result Value Ref Range Status   Specimen Description BLOOD RIGHT ANTECUBITAL  Final   Special Requests   Final    BOTTLES DRAWN AEROBIC AND ANAEROBIC Blood Culture adequate volume   Culture   Final    NO GROWTH 5 DAYS Performed at [facility] [address] [place], [place] [address]    Report Status 05/06/2020 FINAL  Final  Culture, blood (Routine X 2) w Reflex to ID Panel     Status: None   Collection Time: 05/01/20  6:10 AM   Specimen: BLOOD  Result Value Ref Range Status   Specimen Description BLOOD RIGHT ANTECUBITAL  Final   Special Requests   Final    BOTTLES DRAWN AEROBIC AND ANAEROBIC Blood  Culture adequate volume   Culture   Final    NO GROWTH 5 DAYS Performed at [facility], [address] [place], [place] [address]    Report Status 05/06/2020 FINAL  Final  Resp Panel by RT-PCR (Flu A&B, Covid) Nasopharyngeal Swab     Status: None   Collection Time: 05/07/20  4:33 PM   Specimen: Nasopharyngeal Swab; Nasopharyngeal(NP) swabs in vial transport medium  Result Value Ref Range Status   SARS Coronavirus 2 by RT PCR NEGATIVE NEGATIVE Final    Comment: (NOTE) SARS-CoV-2 target nucleic acids are NOT  DETECTED.  The SARS-CoV-2 RNA is generally detectable in upper respiratory specimens during the acute phase of infection. The lowest concentration of SARS-CoV-2 viral copies this assay can detect is 138 copies/mL. A negative result does not preclude SARS-Cov-2 infection and should not be used as the sole basis for treatment or other patient management decisions. A negative result may occur with  improper specimen collection/handling, submission of specimen other than nasopharyngeal swab, presence of viral mutation(s) within the areas targeted by this assay, and inadequate number of viral copies(<138 copies/mL). A negative result must be combined with clinical observations, patient history, and epidemiological information. The expected result is Negative.  Fact Sheet for Patients:  EntrepreneurPulse.com.au  Fact Sheet for Healthcare Providers:  IncredibleEmployment.be  This test is no t yet approved or cleared by the [place] FDA and  has been authorized for detection and/or diagnosis of SARS-CoV-2 by FDA under an Emergency Use Authorization (EUA). This EUA will remain  in effect (meaning this test can be used) for the duration of the COVID-19 declaration under Section 564(b)(1) of the Act, 21 U.S.C.section 360bbb-3(b)(1), unless the authorization is terminated  or revoked sooner.       Influenza A by PCR NEGATIVE  NEGATIVE Final   Influenza B by PCR NEGATIVE NEGATIVE Final    Comment: (NOTE) The Xpert Xpress SARS-CoV-2/FLU/RSV plus assay is intended as an aid in the diagnosis of influenza from Nasopharyngeal swab specimens and should not be used as a sole basis for treatment. Nasal washings and aspirates are unacceptable for Xpert Xpress SARS-CoV-2/FLU/RSV testing.  Fact Sheet for Patients: EntrepreneurPulse.com.au  Fact Sheet for Healthcare Providers: IncredibleEmployment.be  This test is not yet approved or cleared by the [place] FDA and has been authorized for detection and/or diagnosis of SARS-CoV-2 by FDA under an Emergency Use Authorization (EUA). This EUA will remain in effect (meaning this test can be used) for the duration of the COVID-19 declaration under Section 564(b)(1) of the Act, 21 U.S.C. section 360bbb-3(b)(1), unless the authorization is terminated or revoked.  Performed at [facility], [address], [place], [place] [address]     CODE STATUS:  Code Status History    Date Active Date Inactive Code Status Order ID Comments User Context   05/01/2020 1002 05/07/2020 2040 Full Code [id number]  Tyler Pita, MD ED   04/30/2020 1321 05/01/2020 1001 Full Code [id number]  Duffy Bruce, MD ED   04/29/2020 1746 04/30/2020 0253 Full Code [id number]  Naaman Plummer, MD ED   08/31/2018 2126 10/04/2018 1235 Full Code [id number]  Patrecia Pour, NP Inpatient   02/22/2018 0904 03/13/2018 1210 Full Code [id number]  Georganna Skeans, MD ED   02/16/2018 1131 02/17/2018 1533 Full Code [id number]  Jola Schmidt, MD ED   11/16/2017 0018 11/24/2017 2119 Full Code [id number]  Amelia Jo, MD Inpatient   09/29/2016 2043 10/07/2016 1514 Full Code [id number]  Gonzella Lex, MD Inpatient   09/17/2016 1901 09/18/2016 1556 Full Code [id number]  Demetrios Loll, MD Inpatient   Advance Care Planning Activity      TOTAL TIME TAKING CARE OF THIS PATIENT: 28  minutes.    Loletha Grayer M.D on 05/08/2020 at 3:38 PM  Between 7am to 6pm - Pager - [phone]  After 6pm go to [email or web address] - password [email or web address]  [facility]  CC: Primary care physician; Bonnita Nasuti, MD

## 2020-05-08 NOTE — Progress Notes (Signed)
Recreation Therapy Notes  INPATIENT RECREATION THERAPY ASSESSMENT  Patient Details Name: Austin Gates MRN: [id number] DOB: 04/02/1960 Today's Date: 05/08/2020       Information Obtained From: Chart Review  Able to Participate in Assessment/Interview:    Patient Presentation:    Reason for Admission (Per Patient): Active Symptoms,Aggressive/Threatening  Patient Stressors:    Coping Skills:   Prayer  Leisure Interests (2+):  Music - Listen,Sports - Basketball Glass blower/designer)  Frequency of Recreation/Participation: Monthly  Awareness of Community Resources:     [employer]:     Current Use:    If no, Barriers?:    Expressed Interest in [employer] Information:    [employer] of Residence:  Insurance underwriter  Patient Main Form of Transportation: Other (Comment)  Patient Strengths:  N/A  Patient Identified Areas of Improvement:  Mental Health  Patient Goal for Hospitalization:  Improve mental health  Current SI (including self-harm):  No  Current HI:  No  Current AVH: No  Staff Intervention Plan: Group Attendance,Collaborate with Interdisciplinary Treatment Team  Consent to Intern Participation: N/A  Shaverence  [name] 05/08/2020, 2:32 PM

## 2020-05-08 NOTE — Progress Notes (Signed)
Patient admitted to unit, alert and orient to self and place. Denies any SI, HI, AVH, only wants to go home. Patient fidgety during admission, frequent redirection needed. Patient a poor historian and unable to answer questions intellectually. Oriented patient to room and unit. Patient continues to come back and forth out of room to nurses station, loud and intrusive. Prn given with good relief. Pt also given prn trazodone and along with ativan was effective. Pt currently in bed sleep, eyes closed. Skin and contraband search completed and witnessed by Liliane Channel, MHT. Old scars noted to abdomen as well as knot to upper mid back with black head in. No contraband found.

## 2020-05-08 NOTE — Progress Notes (Signed)
Recreation Therapy Notes  INPATIENT RECREATION TR PLAN  Patient Details Name: Austin Gates MRN: [id number] DOB: September 16, 1960 Today's Date: 05/08/2020  Rec Therapy Plan Is patient appropriate for Therapeutic Recreation?: Yes Treatment times per week: at least 3 Estimated Length of Stay: 5-7 days TR Treatment/Interventions: Group participation (Comment)  Discharge Criteria Pt will be discharged from therapy if:: Discharged Treatment plan/goals/alternatives discussed and agreed upon by:: Patient/family  Discharge Summary     Shaverence  [name] 05/08/2020, 2:33 PM

## 2020-05-08 NOTE — Tx Team (Signed)
Interdisciplinary Treatment and Diagnostic Plan Update  05/08/2020 Time of Session: 9:00AM Austin Gates MRN: [id number]  Principal Diagnosis: <principal problem not specified>  Secondary Diagnoses: Active Problems:   Schizoaffective disorder, bipolar type ([place])   Current Medications:  Current Facility-Administered Medications  Medication Dose Route Frequency Provider Last Rate Last Admin  . acetaminophen (TYLENOL) tablet 650 mg  650 mg Oral Q6H PRN [name], John T, MD      . albuterol (VENTOLIN HFA) 108 (90 Base) MCG/ACT inhaler 2 puff  2 puff Inhalation Q4H PRN [name], John T, MD      . alum & mag hydroxide-simeth (MAALOX/MYLANTA) 200-200-20 MG/5ML suspension 30 mL  30 mL Oral Q4H PRN [name], John T, MD      . benztropine (COGENTIN) tablet 0.5 mg  0.5 mg Oral BID [name], John T, MD   0.5 mg at 05/08/20 0804  . clonazePAM (KLONOPIN) tablet 0.5 mg  0.5 mg Oral TID [name], [name], MD   0.5 mg at 05/08/20 0804  . cloZAPine (CLOZARIL) tablet 50 mg  50 mg Oral Daily [name], [name], MD   50 mg at 05/08/20 0804  . divalproex (DEPAKOTE) DR tablet 500 mg  500 mg Oral TID [name], [name], MD   500 mg at 05/08/20 0803  . docusate sodium (COLACE) capsule 100 mg  100 mg Oral BID PRN [name], John T, MD      . feeding supplement (ENSURE ENLIVE / ENSURE PLUS) liquid 237 mL  237 mL Oral BID BM [name], John T, MD      . ferrous sulfate tablet 325 mg  325 mg Oral Q breakfast [name], [name], MD   325 mg at 05/08/20 0804  . food thickener (THICK IT) powder   Oral PRN [name], John T, MD      . haloperidol (HALDOL) tablet 10 mg  10 mg Oral BID [name], [name], MD   10 mg at 05/08/20 0804  . LORazepam (ATIVAN) injection 2 mg  2 mg Intramuscular Q4H PRN [name], [name], MD   2 mg at 05/07/20 2323  . magnesium hydroxide (MILK OF MAGNESIA) suspension 30 mL  30 mL Oral Daily PRN [name], John T, MD      . melatonin tablet 5 mg  5 mg Oral QHS PRN [name], [name], MD   5 mg at 05/07/20 2120  . midodrine  (PROAMATINE) tablet 10 mg  10 mg Oral TID AC [name], [name], MD   10 mg at 05/08/20 0804  . nicotine (NICODERM CQ - dosed in mg/24 hours) patch 21 mg  21 mg Transdermal Daily [name], [name], MD   21 mg at 05/08/20 0807  . polyethylene glycol (MIRALAX / GLYCOLAX) packet 17 g  17 g Oral Daily PRN [name], John T, MD      . traZODone (DESYREL) tablet 100 mg  100 mg Oral QHS PRN Caroline Sauger, NP   100 mg at 05/07/20 2141   PTA Medications: Medications Prior to Admission  Medication Sig Dispense Refill Last Dose  . benztropine (COGENTIN) 0.5 MG tablet Take 0.5 mg by mouth 2 (two) times daily.   05/07/2020 at Unknown time  . divalproex (DEPAKOTE) 500 MG DR tablet Take 500 mg by mouth 3 (three) times daily.     . Ensure (ENSURE) Take 237 mLs by mouth in the morning and at bedtime.     . ferrous sulfate 325 (65 FE) MG tablet Take 325 mg by mouth daily with breakfast.     .  food thickener (THICK IT) POWD Take by mouth in the morning, at noon, and at bedtime.     Marland Kitchen lactulose (CHRONULAC) 10 GM/15ML solution Take 20 g by mouth daily.     . melatonin 3 MG TABS tablet Take 3 mg by mouth at bedtime as needed (sleep).     . midodrine (PROAMATINE) 10 MG tablet Take 10 mg by mouth 3 (three) times daily.       Patient Stressors: Health problems Marital or family conflict  Patient Strengths: Supportive family/friends  Treatment Modalities: Medication Management, Group therapy, Case management,  1 to 1 session with clinician, Psychoeducation, Recreational therapy.   Physician Treatment Plan for Primary Diagnosis: <principal problem not specified> Long Term Goal(s):     Short Term Goals:    Medication Management: Evaluate patient's response, side effects, and tolerance of medication regimen.  Therapeutic Interventions: 1 to 1 sessions, Unit Group sessions and Medication administration.  Evaluation of Outcomes: Not Met  Physician Treatment Plan for Secondary Diagnosis: Active Problems:    Schizoaffective disorder, bipolar type ([place])  Long Term Goal(s):     Short Term Goals:       Medication Management: Evaluate patient's response, side effects, and tolerance of medication regimen.  Therapeutic Interventions: 1 to 1 sessions, Unit Group sessions and Medication administration.  Evaluation of Outcomes: Not Met   RN Treatment Plan for Primary Diagnosis: <principal problem not specified> Long Term Goal(s): Knowledge of disease and therapeutic regimen to maintain health will improve  Short Term Goals: Ability to remain free from injury will improve, Ability to verbalize frustration and anger appropriately will improve, Ability to demonstrate self-control, Ability to participate in decision making will improve, Ability to verbalize feelings will improve, Ability to identify and develop effective coping behaviors will improve and Compliance with prescribed medications will improve  Medication Management: RN will administer medications as ordered by provider, will assess and evaluate patient's response and provide education to patient for prescribed medication. RN will report any adverse and/or side effects to prescribing provider.  Therapeutic Interventions: 1 on 1 counseling sessions, Psychoeducation, Medication administration, Evaluate responses to treatment, Monitor vital signs and CBGs as ordered, Perform/monitor CIWA, COWS, AIMS and Fall Risk screenings as ordered, Perform wound care treatments as ordered.  Evaluation of Outcomes: Not Met   LCSW Treatment Plan for Primary Diagnosis: <principal problem not specified> Long Term Goal(s): Safe transition to appropriate next level of care at discharge, Engage patient in therapeutic group addressing interpersonal concerns.  Short Term Goals: Engage patient in aftercare planning with referrals and resources, Increase social support, Increase ability to appropriately verbalize feelings, Increase emotional regulation, Facilitate  acceptance of mental health diagnosis and concerns, Identify triggers associated with mental health/substance abuse issues and Increase skills for wellness and recovery  Therapeutic Interventions: Assess for all discharge needs, 1 to 1 time with Social worker, Explore available resources and support systems, Assess for adequacy in community support network, Educate family and significant other(s) on suicide prevention, Complete Psychosocial Assessment, Interpersonal group therapy.  Evaluation of Outcomes: Not Met   Progress in Treatment: Attending groups: No. Participating in groups: No. Taking medication as prescribed: Yes. Toleration medication: Yes. Family/Significant other contact made: No, will contact:  CSW will contact guardian. Patient understands diagnosis: No. Discussing patient identified problems/goals with staff: Yes. Medical problems stabilized or resolved: Yes. Denies suicidal/homicidal ideation: Yes. Issues/concerns per patient self-inventory: No. Other: None.  New problem(s) identified: No, Describe:  none.  New Short Term/Long Term Goal(s): medication management for mood  stabilization; elimination of SI thoughts; development of comprehensive mental wellness.  Patient Goals: "I want to go home."   Discharge Plan or Barriers: CSW will assist with development of an appropriate discharge/aftercare plan. Pt will needed placement and has a guardian.  Reason for Continuation of Hospitalization: Aggression Medical Issues Medication stabilization  Estimated Length of Stay: 1-7 days  Attendees: Patient: Austin Gates 05/08/2020 9:12 AM  Physician: Selina Cooley, MD 05/08/2020 9:12 AM  Nursing: Marla Roe, RN 05/08/2020 9:12 AM  RN Care Manager: 05/08/2020 9:12 AM  Social Worker: Chalmers Guest. Guerry Bruin, MSW, [place], [place] 05/08/2020 9:12 AM  Recreational Therapist: Devin Going, LRT 05/08/2020 9:12 AM  Other: Assunta Curtis, MSW, LCSW 05/08/2020 9:12 AM  Other: Kiva Martin[place],  MSW, LCSW-A 05/08/2020 9:12 AM  Other: 05/08/2020 9:12 AM    Scribe for Treatment Team: Shirl Harris, LCSW 05/08/2020 9:12 AM

## 2020-05-08 NOTE — Progress Notes (Addendum)
MHT was sitting one to one with patient. Patient needed assistance with changing adult diaper. As MHT was assisting him, patient swung his arm back aggressively, and elbowing MHT in the head. MHT backed away from patient and called for help. Nurse's, other MHT, and Animal nutritionist on the floor came immediately to assist. As staff attempted to assist patient, he was yelling continuously and attempting to swing at other staff. Staff verbally instructed him to remain in his room to deescalate

## 2020-05-09 LAB — CBC WITH DIFFERENTIAL/PLATELET
Abs Immature Granulocytes: 0.28 10*3/uL — ABNORMAL HIGH (ref 0.00–0.07)
Basophils Absolute: 0 10*3/uL (ref 0.0–0.1)
Basophils Relative: 0 %
Eosinophils Absolute: 0.1 10*3/uL (ref 0.0–0.5)
Eosinophils Relative: 1 %
HCT: 35.6 % — ABNORMAL LOW (ref 39.0–52.0)
Hemoglobin: 11 g/dL — ABNORMAL LOW (ref 13.0–17.0)
Immature Granulocytes: 4 %
Lymphocytes Relative: 19 %
Lymphs Abs: 1.3 10*3/uL (ref 0.7–4.0)
MCH: 28.7 pg (ref 26.0–34.0)
MCHC: 30.9 g/dL (ref 30.0–36.0)
MCV: 93 fL (ref 80.0–100.0)
Monocytes Absolute: 0.7 10*3/uL (ref 0.1–1.0)
Monocytes Relative: 11 %
Neutro Abs: 4.3 10*3/uL (ref 1.7–7.7)
Neutrophils Relative %: 65 %
Platelets: 149 10*3/uL — ABNORMAL LOW (ref 150–400)
RBC: 3.83 MIL/uL — ABNORMAL LOW (ref 4.22–5.81)
RDW: 14.7 % (ref 11.5–15.5)
WBC: 6.8 10*3/uL (ref 4.0–10.5)
nRBC: 0.7 % — ABNORMAL HIGH (ref 0.0–0.2)

## 2020-05-09 MED ORDER — ZIPRASIDONE MESYLATE 20 MG IM SOLR
20.0000 mg | INTRAMUSCULAR | Status: DC | PRN
  Administered 2020-05-09 – 2020-05-15 (×7): 20 mg via INTRAMUSCULAR
  Filled 2020-05-09 (×8): qty 20

## 2020-05-09 MED ORDER — DIPHENHYDRAMINE HCL 50 MG/ML IJ SOLN
50.0000 mg | INTRAMUSCULAR | Status: DC | PRN
  Administered 2020-05-10 – 2020-05-18 (×15): 50 mg via INTRAMUSCULAR
  Filled 2020-05-09 (×18): qty 1

## 2020-05-09 MED ORDER — LORAZEPAM 2 MG/ML IJ SOLN
2.0000 mg | INTRAMUSCULAR | Status: DC | PRN
  Administered 2020-05-10 – 2020-05-19 (×15): 2 mg via INTRAMUSCULAR
  Filled 2020-05-09 (×14): qty 1

## 2020-05-09 MED ORDER — CLONAZEPAM 0.25 MG PO TBDP: 1.0000 mg | ORAL_TABLET | Freq: Three times a day (TID) | ORAL | Status: DC

## 2020-05-09 MED ORDER — CLONAZEPAM 0.25 MG PO TBDP
0.5000 mg | ORAL_TABLET | Freq: Three times a day (TID) | ORAL | Status: DC
  Administered 2020-05-09 – 2020-05-11 (×6): 0.5 mg via ORAL
  Filled 2020-05-09 (×6): qty 2

## 2020-05-09 MED ORDER — HALOPERIDOL LACTATE 5 MG/ML IJ SOLN
5.0000 mg | Freq: Four times a day (QID) | INTRAMUSCULAR | Status: DC | PRN
  Administered 2020-05-09 – 2020-05-19 (×15): 5 mg via INTRAMUSCULAR
  Filled 2020-05-09 (×16): qty 1

## 2020-05-09 MED ORDER — ZIPRASIDONE MESYLATE 20 MG IM SOLR
20.0000 mg | Freq: Once | INTRAMUSCULAR | Status: AC
  Administered 2020-05-09: 20 mg via INTRAMUSCULAR
  Filled 2020-05-09: qty 20

## 2020-05-09 MED ORDER — OLANZAPINE 5 MG PO TBDP
10.0000 mg | ORAL_TABLET | Freq: Four times a day (QID) | ORAL | Status: DC | PRN
  Administered 2020-05-09 – 2020-05-25 (×12): 10 mg via ORAL
  Filled 2020-05-09 (×14): qty 2

## 2020-05-09 NOTE — Progress Notes (Signed)
Patient noted sleeping in room, prn given early with good relief. Pt in room resting eyes closed in no distress. Sitter for safety. Will continue to monitor.

## 2020-05-09 NOTE — Progress Notes (Signed)
Patient entered dayroom and began cursing at staff. Staff made several attempts to redirect patient behaviors, but was unsuccessful. Patient became physically aggressive, approaching staff with fist clinched and attempted to punch staff. As patient attempted to punch staff, patient began to fall and staff assisted him to the ground. Other staff then came to assist, patient continued to speak aggressively, threatening to hit. Staff assisted him off the floor and escorted him to a wheelchair to assist patient back to his room.

## 2020-05-09 NOTE — Progress Notes (Signed)
Patient continues to be aggressive, argumentative and intrusive. Not easily redirected. Prns given for anxiety. Awaiting effectiveness.

## 2020-05-09 NOTE — [facility] Group Notes (Signed)
[facility] Group Notes: (Clinical Social Work)   05/09/2020      Type of Therapy:  Group Therapy   Participation Level:  Did Not Attend - was invited individually by Nurse/MHT and chose not to attend.  Unable to attend due to behaviors    Raina Mina, [place] 05/09/2020  1:18 PM

## 2020-05-09 NOTE — Progress Notes (Signed)
1:1  Note  Patient in his room sleeping respirations noted.  No s/s of distress will continue to monitor. Support and encouragement Provided as needed.
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
## 2020-05-09 NOTE — Progress Notes (Signed)
Patient had incontinent episode, clothing and bed soiled. Pt showered and changed.

## 2020-05-09 NOTE — Plan of Care (Signed)
  Problem: Education: Goal: Knowledge of [facility] Education information/materials will improve Outcome: Not Progressing Goal: Emotional status will improve Outcome: Not Progressing Goal: Mental status will improve Outcome: Not Progressing Goal: Verbalization of understanding the information provided will improve Outcome: Not Progressing   Problem: Activity: Goal: Interest or engagement in activities will improve Outcome: Not Progressing   Problem: Coping: Goal: Ability to verbalize frustrations and anger appropriately will improve Outcome: Not Progressing Goal: Ability to demonstrate self-control will improve Outcome: Not Progressing   Problem: Health Behavior/Discharge Planning: Goal: Identification of resources available to assist in meeting health care needs will improve Outcome: Not Progressing Goal: Compliance with treatment plan for underlying cause of condition will improve Outcome: Not Progressing

## 2020-05-09 NOTE — Progress Notes (Signed)
D Patient in his room naked at the beginning of the shift. Patient reoriented and manage to take his shower with help from MHT.  A Prn Trazodone 100 mg given at 2114 for sleep and  Scheduled medications administered per Provider order. Support and encouragement provided. Routine safety checks conducted every 15 minutes. Patient notified to inform staff with problems or concerns.  R. No adverse drug reactions noted. Patient contracts for safety at this time 1:1  Observations for self harm behavior ongoing without self harm gestures  time. 1Will continue to monitor.

## 2020-05-10 LAB — CBC WITH DIFFERENTIAL/PLATELET
Abs Immature Granulocytes: 0.12 10*3/uL — ABNORMAL HIGH (ref 0.00–0.07)
Basophils Absolute: 0 10*3/uL (ref 0.0–0.1)
Basophils Relative: 0 %
Eosinophils Absolute: 0.1 10*3/uL (ref 0.0–0.5)
Eosinophils Relative: 1 %
HCT: 36.3 % — ABNORMAL LOW (ref 39.0–52.0)
Hemoglobin: 11.2 g/dL — ABNORMAL LOW (ref 13.0–17.0)
Immature Granulocytes: 2 %
Lymphocytes Relative: 16 %
Lymphs Abs: 1.1 10*3/uL (ref 0.7–4.0)
MCH: 28.8 pg (ref 26.0–34.0)
MCHC: 30.9 g/dL (ref 30.0–36.0)
MCV: 93.3 fL (ref 80.0–100.0)
Monocytes Absolute: 0.3 10*3/uL (ref 0.1–1.0)
Monocytes Relative: 5 %
Neutro Abs: 5.1 10*3/uL (ref 1.7–7.7)
Neutrophils Relative %: 76 %
Platelets: 158 10*3/uL (ref 150–400)
RBC: 3.89 MIL/uL — ABNORMAL LOW (ref 4.22–5.81)
RDW: 14.8 % (ref 11.5–15.5)
WBC: 6.8 10*3/uL (ref 4.0–10.5)
nRBC: 0.6 % — ABNORMAL HIGH (ref 0.0–0.2)

## 2020-05-10 MED ORDER — HALOPERIDOL LACTATE 5 MG/ML IJ SOLN
5.0000 mg | Freq: Once | INTRAMUSCULAR | Status: DC
  Filled 2020-05-10: qty 1

## 2020-05-10 MED ORDER — HALOPERIDOL LACTATE 5 MG/ML IJ SOLN
INTRAMUSCULAR | Status: AC
  Filled 2020-05-10: qty 1

## 2020-05-10 MED ORDER — CLOZAPINE 25 MG PO TABS
75.0000 mg | ORAL_TABLET | Freq: Every day | ORAL | Status: DC
  Administered 2020-05-11 – 2020-05-12 (×2): 75 mg via ORAL
  Filled 2020-05-10 (×2): qty 3

## 2020-05-10 NOTE — Progress Notes (Signed)
Patient came to the dayroom to eat dinner. Patient finished his meal, took his medications, and returned to his room. Patient took off his clothes in the room. Patient is not in any distress.
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
## 2020-05-10 NOTE — Progress Notes (Signed)
Patient is animated on assessment. Denies SI, HI, and AVH. Patient ate his breakfast in the dayroom and returned to his room to sleep. He woke up at lunchtime and is calm, sitting in the dayroom.  Patient remains on 1:1. Patient is not in any distress and remains safe on the unit at this time.
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
## 2020-05-10 NOTE — Plan of Care (Signed)
  Problem: Education: Goal: Knowledge of [facility] Education information/materials will improve Outcome: Not Progressing Goal: Emotional status will improve Outcome: Not Progressing Goal: Mental status will improve Outcome: Not Progressing Goal: Verbalization of understanding the information provided will improve Outcome: Not Progressing   Problem: Activity: Goal: Interest or engagement in activities will improve Outcome: Not Progressing Goal: Sleeping patterns will improve Outcome: Not Progressing   Problem: Coping: Goal: Ability to verbalize frustrations and anger appropriately will improve Outcome: Not Progressing Goal: Ability to demonstrate self-control will improve Outcome: Not Progressing   Problem: Health Behavior/Discharge Planning: Goal: Identification of resources available to assist in meeting health care needs will improve Outcome: Not Progressing Goal: Compliance with treatment plan for underlying cause of condition will improve Outcome: Not Progressing   Problem: Physical Regulation: Goal: Ability to maintain clinical measurements within normal limits will improve Outcome: Not Progressing   Problem: Safety: Goal: Periods of time without injury will increase Outcome: Not Progressing   Problem: Education: Goal: Ability to verbalize precipitating factors for violent behavior will improve Outcome: Not Progressing   Problem: Coping: Goal: Ability to verbalize frustrations and anger appropriately will improve Outcome: Not Progressing   Problem: Health Behavior/Discharge Planning: Goal: Ability to implement measures to prevent violent behavior in the future will improve Outcome: Not Progressing   Problem: Safety: Goal: Ability to demonstrate self-control will improve Outcome: Not Progressing Goal: Ability to redirect hostility and anger into socially appropriate behaviors will improve Outcome: Not Progressing   

## 2020-05-10 NOTE — Progress Notes (Addendum)
1:1 observation 1900-2300 Patient has been intrusive and attention-seeking but redirectable. Medication compliant. 2300-0200 Patient resting in bed with eyes closed 0200-0700 Patient has been waking up and wetting the bed. Seeking attention from male staff. Continues to come out without his clothes on. Patient showered and bedding changed and was given clean clothes and clean bedding. Continuing to come out naked, asking for male staff. Patient began banging on his door and getting loud and got Haldol 5 mg IM, Benadryl 50 mg IM and Ativan 2 mg IM per prn order. Continues to require frequent redirection, singing loudly "I'm in love with a stripper".
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
## 2020-05-10 NOTE — Progress Notes (Signed)
Patient came to ask for Ensure. Ensure was given. Patient returned to his room and took off all his clothes. Patient is walking around his room without clothing. Patient becomes agitated when staff ask him to put his clothes back on.

## 2020-05-10 NOTE — Progress Notes (Signed)
LCSW Group Therapy Note  05/10/2020 1:58 PM  Type of Therapy/Topic:  Group Therapy:  Feelings about Diagnosis  Participation Level:  None   Description of Group:   This group will allow patients to explore their thoughts and feelings about diagnoses they have received. Patients will be guided to explore their level of understanding and acceptance of these diagnoses. Facilitator will encourage patients to process their thoughts and feelings about the reactions of others to their diagnosis and will guide patients in identifying ways to discuss their diagnosis with significant others in their lives. This group will be process-oriented, with patients participating in exploration of their own experiences, giving and receiving support, and processing challenge from other group members.   Therapeutic Goals: 1. Patient will demonstrate understanding of diagnosis as evidenced by identifying two or more symptoms of the disorder 2. Patient will be able to express two feelings regarding the diagnosis 3. Patient will demonstrate their ability to communicate their needs through discussion and/or role play  Summary of Patient Progress: Patient presented late to group, attempted to participate in conversation. Patient is inaudible and left the group shortly after.     Therapeutic Modalities:   Cognitive Behavioral Therapy Brief Therapy Feelings Identification   Paulla Dolly, MSW, [place], [place] 05/10/2020 1:58 PM

## 2020-05-11 MED ORDER — CLOPIDOGREL BISULFATE 75 MG PO TABS
75.0000 mg | ORAL_TABLET | Freq: Every day | ORAL | Status: DC
  Administered 2020-05-12 – 2020-06-15 (×35): 75 mg via ORAL
  Filled 2020-05-11 (×40): qty 1

## 2020-05-11 MED ORDER — DESMOPRESSIN ACETATE 0.2 MG PO TABS
0.2000 mg | ORAL_TABLET | Freq: Every day | ORAL | Status: DC
  Administered 2020-05-11 – 2020-06-14 (×33): 0.2 mg via ORAL
  Filled 2020-05-11 (×36): qty 1

## 2020-05-11 MED ORDER — ZOLPIDEM TARTRATE 5 MG PO TABS
5.0000 mg | ORAL_TABLET | Freq: Every day | ORAL | Status: DC
  Administered 2020-05-11 – 2020-06-14 (×34): 5 mg via ORAL
  Filled 2020-05-11 (×35): qty 1

## 2020-05-11 MED ORDER — LORAZEPAM 1 MG PO TABS
1.0000 mg | ORAL_TABLET | Freq: Two times a day (BID) | ORAL | Status: DC
  Administered 2020-05-11 – 2020-06-15 (×70): 1 mg via ORAL
  Filled 2020-05-11 (×70): qty 1
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
## 2020-05-11 NOTE — Progress Notes (Signed)
Recreation Therapy Notes  Date: 05/11/2020  Time: 9:30 am   Location: Craft room     Behavioral response: N/A   Intervention Topic: Social-Skills   Discussion/Intervention: Patient did not attend group.   Clinical Observations/Feedback:  Patient did not attend group.   Shaverence [name] LRT/CTRS        Shaverence  [name] 05/11/2020 11:57 AM

## 2020-05-11 NOTE — Plan of Care (Signed)
  Problem: Education: Goal: Knowledge of [facility] Education information/materials will improve Outcome: Not Progressing Goal: Emotional status will improve Outcome: Not Progressing Goal: Mental status will improve Outcome: Not Progressing Goal: Verbalization of understanding the information provided will improve Outcome: Not Progressing   Problem: Activity: Goal: Interest or engagement in activities will improve Outcome: Not Progressing Goal: Sleeping patterns will improve Outcome: Not Progressing   Problem: Coping: Goal: Ability to verbalize frustrations and anger appropriately will improve Outcome: Not Progressing Goal: Ability to demonstrate self-control will improve Outcome: Not Progressing   Problem: Health Behavior/Discharge Planning: Goal: Identification of resources available to assist in meeting health care needs will improve Outcome: Not Progressing Goal: Compliance with treatment plan for underlying cause of condition will improve Outcome: Not Progressing   Problem: Physical Regulation: Goal: Ability to maintain clinical measurements within normal limits will improve Outcome: Not Progressing   Problem: Safety: Goal: Periods of time without injury will increase Outcome: Not Progressing   Problem: Education: Goal: Ability to verbalize precipitating factors for violent behavior will improve Outcome: Not Progressing   Problem: Coping: Goal: Ability to verbalize frustrations and anger appropriately will improve Outcome: Not Progressing   Problem: Health Behavior/Discharge Planning: Goal: Ability to implement measures to prevent violent behavior in the future will improve Outcome: Not Progressing   Problem: Safety: Goal: Ability to demonstrate self-control will improve Outcome: Not Progressing Goal: Ability to redirect hostility and anger into socially appropriate behaviors will improve Outcome: Not Progressing   

## 2020-05-11 NOTE — [facility] Group Notes (Signed)
LCSW Group Therapy Note   05/11/2020 2:07 PM  Type of Therapy and Topic:  Group Therapy:  Overcoming Obstacles   Participation Level:  Did Not Attend   Description of Group:    In this group patients will be encouraged to explore what they see as obstacles to their own wellness and recovery. They will be guided to discuss their thoughts, feelings, and behaviors related to these obstacles. The group will process together ways to cope with barriers, with attention given to specific choices patients can make. Each patient will be challenged to identify changes they are motivated to make in order to overcome their obstacles. This group will be process-oriented, with patients participating in exploration of their own experiences as well as giving and receiving support and challenge from other group members.   Therapeutic Goals: 1. Patient will identify personal and current obstacles as they relate to admission. 2. Patient will identify barriers that currently interfere with their wellness or overcoming obstacles.  3. Patient will identify feelings, thought process and behaviors related to these barriers. 4. Patient will identify two changes they are willing to make to overcome these obstacles:      Summary of Patient Progress X    Therapeutic Modalities:   Cognitive Behavioral Therapy Solution Focused Therapy Motivational Interviewing Relapse Prevention Therapy  Hedy Camara R. Guerry Bruin, MSW, [place], [place] 05/11/2020 2:07 PM
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
## 2020-05-11 NOTE — Progress Notes (Signed)
Patient getting more aggressive physically and verbally. PRN medications given.Patient receptive with medicine.Safety maintained with 1: 1 sitter.

## 2020-05-11 NOTE — Progress Notes (Signed)
Patient up after a nap.Yelling and physically aggressive by banging on the door.Patient is not easily redirectable.Lunch provided.Cleaned the mess patient made after lunch.Safety maintained with 1:1 sitter.

## 2020-05-11 NOTE — Plan of Care (Signed)
  Problem: Education: Goal: Knowledge of [place] [facility] Education information/materials will improve Outcome: Not Progressing Goal: Emotional status will improve Outcome: Not Progressing Goal: Mental status will improve Outcome: Not Progressing Goal: Verbalization of understanding the information provided will improve Outcome: Not Progressing   Problem: Activity: Goal: Interest or engagement in activities will improve Outcome: Not Progressing Goal: Sleeping patterns will improve Outcome: Not Progressing   Problem: Coping: Goal: Ability to verbalize frustrations and anger appropriately will improve Outcome: Not Progressing Goal: Ability to demonstrate self-control will improve Outcome: Not Progressing   Problem: Health Behavior/Discharge Planning: Goal: Identification of resources available to assist in meeting health care needs will improve Outcome: Not Progressing Goal: Compliance with treatment plan for underlying cause of condition will improve Outcome: Not Progressing

## 2020-05-11 NOTE — BHH Counselor (Signed)
CSW attempted to contact Zipporah Plants, Care Coordinator, [phone].  CSW left HIPAA compliant voicemail.  Assunta Curtis, MSW, LCSW 05/11/2020 1:15 PM

## 2020-05-11 NOTE — Plan of Care (Signed)
Patient had some period of rest between meals.Compliant with medications. Patient tolerated meals. No signs of aspiration noted. Safety maintained with 1:1 sitter.

## 2020-05-12 LAB — VITAMIN B1: Vitamin B1 (Thiamine): 132.1 nmol/L (ref 66.5–200.0)

## 2020-05-12 LAB — VALPROIC ACID LEVEL: Valproic Acid Lvl: 38 ug/mL — ABNORMAL LOW (ref 50.0–100.0)

## 2020-05-12 MED ORDER — DIVALPROEX SODIUM 500 MG PO DR TAB
1000.0000 mg | DELAYED_RELEASE_TABLET | Freq: Two times a day (BID) | ORAL | Status: DC
  Administered 2020-05-12 – 2020-05-15 (×7): 1000 mg via ORAL
  Filled 2020-05-12 (×7): qty 2

## 2020-05-12 MED ORDER — CLOZAPINE 100 MG PO TABS
100.0000 mg | ORAL_TABLET | Freq: Every day | ORAL | Status: DC
  Administered 2020-05-13: 100 mg via ORAL
  Filled 2020-05-12: qty 1

## 2020-05-12 NOTE — BHH Counselor (Signed)
CSW faxed referral to [place] also faxed to [facility] for authorization number.    CSW received confirmation that both faxes were successful.  Assunta Curtis, MSW, LCSW 05/12/2020 3:22 PM

## 2020-05-12 NOTE — Progress Notes (Signed)
Patient is now trying to get out of the exit door on the back hall. Patient has been redirected to stop pushing on the door, afterwards, he went into his room.

## 2020-05-12 NOTE — Progress Notes (Signed)
Patient continues to yell and bang on doors and walls. Stripping naked and cursing and threatening staff. Not able to redirect patient. Patient given more prn medications as well as night time meds. Pt noted after meds given to be continuously agitated. Writer and staff assisted pt to bed, pt slept remaining of night. Encouragement and support of good behavior provided. Medications given as prescribed. Pt remains safe on unit at present with q 15 min checks.

## 2020-05-12 NOTE — Plan of Care (Signed)
D- Patient alert and oriented to person and situation. Patient presents in a labile/threatening mood on assessment. Whenever patient doesn't like what staff says to him or he does not get what he wants, he starts to curse at staff and bang on the double doors. He also will go into his room and slam his door. Patient's speech is slurred and it is very hard to understand him.  From what this writer could understand, he denies SI, HI, AVH, and pain at this time. When patient was asked about depression and anxiety, he stated "no", stating "I'm not gone hurt nobody". Patient had no stated goals for today.  A- Scheduled medications administered to patient, per MD orders. Support and encouragement provided.  Routine safety checks conducted every 15 minutes and patient continues to be on 1:1. Patient informed to notify staff with problems or concerns.  R- No adverse drug reactions noted. Patient contracts for safety at this time. Patient compliant with medications. Patient receptive, calm, and cooperative. Patient Patient remains safe at this time.  Problem: Education: Goal: Knowledge of [place] General Education information/materials will improve Outcome: Not Progressing Goal: Emotional status will improve Outcome: Not Progressing Goal: Mental status will improve Outcome: Not Progressing Goal: Verbalization of understanding the information provided will improve Outcome: Not Progressing   Problem: Activity: Goal: Interest or engagement in activities will improve Outcome: Not Progressing Goal: Sleeping patterns will improve Outcome: Not Progressing   Problem: Coping: Goal: Ability to verbalize frustrations and anger appropriately will improve Outcome: Not Progressing Goal: Ability to demonstrate self-control will improve Outcome: Not Progressing   Problem: Health Behavior/Discharge Planning: Goal: Identification of resources available to assist in meeting health care needs will  improve Outcome: Not Progressing Goal: Compliance with treatment plan for underlying cause of condition will improve Outcome: Not Progressing   Problem: Physical Regulation: Goal: Ability to maintain clinical measurements within normal limits will improve Outcome: Not Progressing   Problem: Safety: Goal: Periods of time without injury will increase Outcome: Not Progressing   Problem: Education: Goal: Ability to verbalize precipitating factors for violent behavior will improve Outcome: Not Progressing   Problem: Coping: Goal: Ability to verbalize frustrations and anger appropriately will improve Outcome: Not Progressing   Problem: Health Behavior/Discharge Planning: Goal: Ability to implement measures to prevent violent behavior in the future will improve Outcome: Not Progressing   Problem: Safety: Goal: Ability to demonstrate self-control will improve Outcome: Not Progressing Goal: Ability to redirect hostility and anger into socially appropriate behaviors will improve Outcome: Not Progressing

## 2020-05-12 NOTE — Progress Notes (Signed)
Patient was given IM Geodon for agitation. Patient tolerated medication administration well, without any issues.

## 2020-05-12 NOTE — BHH Counselor (Signed)
CSW contacted [facility] and confirmed receipt of referral.  CSW spoke with [place].  CSW attempted to complete the verbal portion of the referral.  CSW spoke with Gae Bon and call was disconnected.  CSW did call back and spoke with Joelene Millin and completed the verbal portion of the referral.  Assunta Curtis, MSW, LCSW 05/12/2020 4:11 PM
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
## 2020-05-12 NOTE — Progress Notes (Signed)
Patient noted banging on windows and wall yelling at staff. Threatening to "fuck somebody up." Writer attempted to redirect patient yelling. Writer continued his aggression. Prn given for increased agitation.

## 2020-05-12 NOTE — BHH Counselor (Signed)
CSW spoke with Zipporah Plants, Care Coordinator, [phone].  He reports that patient has [place] [facility] with [facility].  Her supervisor is Barrie Dunker.  He reports that patient has been approved for [facility], however, pt placed on a waitlist, has been on the list since September.   Care Coordinator appears relieved at [facility] Referral states that staff are willing to help with any assistance needed.  Assunta Curtis, MSW, LCSW 05/12/2020 2:21 PM

## 2020-05-12 NOTE — Progress Notes (Signed)
Patient is continuously banging on the doors on the back hall, hollering. He is cursing at his 1:1 sitter stating "fuck you bitch, I'll fuck you in the asshole". Patient proceeds to pull his pants down at the double doors while looking at his sitter. Patient continues to come out into the hallway and bang on the door and then goes back into his room and slams the door. He stays in his room for about five minutes and then starts the cycle all over again, with coming out to bang on the doors and yelling. This Probation officer, along with Gigi, RN administered PRN medication to help calm patient down. Patient walked into his room and pulled his pants down, laid across the bed, and willingly took the medication.

## 2020-05-12 NOTE — Progress Notes (Signed)
Patient is banging on the door asking for pop tarts and peanut butter and graham crackers. Patient was informed that snacks are given out at eight o'clock at night. Patient does not understand and continues to bang on the window and ask for snacks, reporting that he is hungry and has not had anything to eat. This Probation officer tried to inform patient that he had lunch, along with his Ensure, however, patient continues to disregard what this Probation officer is saying and keeps asking for snacks.

## 2020-05-12 NOTE — Progress Notes (Signed)
Patient is now awake and has allowed this writer to obtain a set of vitals on him, as well as, tolerated his evening medication administration. Patient is eating his dinner in the dayroom, with his assigned safety sitter present.

## 2020-05-12 NOTE — Progress Notes (Signed)
[facility] MD Progress Note  05/12/2020 11:34 AM Austin Gates  MRN:  [id number]   CC "Can I go home today."  Subjective:  60 year old male with schizoaffective disorder, bipolar type and moderate ID (IQ 39) presenting with increased agitation and aggression. Overnight patient received multiple PRNs for acute agitation, aggression, and threats of bodily harm to staff. Today patient remains highly agitated and aggressive banging on doors and yelling at staff. He has been medication compliant. ADLs impaired with patient incontinent while asleep.   Patient seen today in presence of security for safety. At time of interview he is fairly tired from his morning medications. He simply asks if he can go home today. When told we still have to find him a place to stay, he then requests ice cream. His speech remains difficult to understand. He denies suicidal ideations, homicidal ideations, visual hallucinations, and auditory hallucinations. He does not appear to be responding to internal stimuli.   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 Hypertension   . Intellectual disability   . Obsessive-compulsive disorder   . Schizo-affective schizophrenia [facility])     Past Surgical History:  Procedure Laterality Date  . COLONOSCOPY WITH PROPOFOL N/A 12/28/2017   Procedure: COLONOSCOPY WITH PROPOFOL;  Surgeon: Jonathon Bellows, MD;  Location: [facility];  Service: Gastroenterology;  Laterality: N/A;  . HYDROCELE EXCISION Bilateral 02/22/2018   Procedure: bilateral HYDROCELECTOMY ADULT;  Surgeon: Cleon Gustin,  MD;  Location: [place];  Service: Urology;  Laterality: Bilateral;  . INSERTION OF ILIAC STENT Left 02/22/2018   Procedure: INSERTION LEFT SUPERIOR FEMORAL ARTERY USING 6MM X 5CM VIABHON STENT with mynx device closure on right femoral artery;  Surgeon: Waynetta Sandy, MD;  Location: [place];  Service: Vascular;  Laterality: Left;  . KNEE ARTHROSCOPY    . SCROTAL EXPLORATION N/A 02/22/2018   Procedure: SCROTUM EXPLORATION;  Surgeon: Cleon Gustin, MD;  Location: [place];  Service: Urology;  Laterality: N/A;  . UPPER EXTREMITY ANGIOGRAM Left 02/22/2018   Procedure: left lower EXTREMITY ANGIOGRAM;  Surgeon: Waynetta Sandy, MD;  Location: [facility] OR;  Service: Vascular;  Laterality: Left;   Family History:  Family History  Problem Relation Age of Onset  . Diabetes Mother    Family Psychiatric  History: See H&P Social History:  Social History   Substance and Sexual Activity  Alcohol Use No     Social History   Substance and Sexual Activity  Drug Use No    Social History   Socioeconomic History  . Marital status: Single    Spouse name: Not on file  . Number of children: Not on file  . Years of education: Not on file  . Highest education level: Not on file  Occupational History  . Occupation: Disabled  Tobacco Use  . Smoking status: Former Smoker    Types: Cigarettes  . Smokeless tobacco: Never Used  Vaping Use  . Vaping Use: Unknown  Substance and Sexual Activity  . Alcohol use: No  . Drug use: No  . Sexual activity: Never  Other Topics Concern  . Not on file  Social History Narrative   **  Merged History Encounter **       Social Determinants of Health   Financial Resource Strain: Not on file  Food Insecurity: Not on file  Transportation Needs: Not on file  Physical Activity: Not on file  Stress: Not on file  Social Connections: Not on file   Additional Social History:     Sleep: Fair  Appetite:  Good  Current Medications: Current  Facility-Administered Medications  Medication Dose Route Frequency Provider Last Rate Last Admin  . acetaminophen (TYLENOL) tablet 650 mg  650 mg Oral Q6H PRN [name], John T, MD      . albuterol (VENTOLIN HFA) 108 (90 Base) MCG/ACT inhaler 2 puff  2 puff Inhalation Q4H PRN [name], John T, MD      . alum & mag hydroxide-simeth (MAALOX/MYLANTA) 200-200-20 MG/5ML suspension 30 mL  30 mL Oral Q4H PRN [name], John T, MD      . benztropine (COGENTIN) tablet 0.5 mg  0.5 mg Oral BID [name], [name], MD   0.5 mg at 05/12/20 0735  . clopidogrel (PLAVIX) tablet 75 mg  75 mg Oral Daily Salley Scarlet, MD   75 mg at 05/12/20 0736  . cloZAPine (CLOZARIL) tablet 75 mg  75 mg Oral Daily Rulon Sera, MD   75 mg at 05/12/20 0736  . desmopressin (DDAVP) tablet 0.2 mg  0.2 mg Oral QHS Selina Cooley M, MD   0.2 mg at 05/11/20 2047  . haloperidol lactate (HALDOL) injection 5 mg  5 mg Intramuscular QID PRN Rulon Sera, MD   5 mg at 05/11/20 1934   And  . LORazepam (ATIVAN) injection 2 mg  2 mg Intramuscular Q4H PRN Rulon Sera, MD   2 mg at 05/11/20 1934   And  . diphenhydrAMINE (BENADRYL) injection 50 mg  50 mg Intramuscular Q4H PRN Rulon Sera, MD   50 mg at 05/11/20 1934  . divalproex (DEPAKOTE) DR tablet 1,000 mg  1,000 mg Oral Q12H Salley Scarlet, MD      . docusate sodium (COLACE) capsule 100 mg  100 mg Oral BID PRN [name], John T, MD      . feeding supplement (ENSURE ENLIVE / ENSURE PLUS) liquid 237 mL  237 mL Oral BID BM [name], John T, MD   237 mL at 05/11/20 1426  . ferrous sulfate tablet 325 mg  325 mg Oral Q breakfast [name], [name], MD   325 mg at 05/12/20 0736  . food thickener (THICK IT) powder   Oral PRN [name], John T, MD      . haloperidol (HALDOL) tablet 10 mg  10 mg Oral BID [name], John T, MD   10 mg at 05/12/20 3614  . haloperidol lactate (HALDOL) injection 5 mg  5 mg Intramuscular Once Rulon Sera, MD      . LORazepam (ATIVAN) tablet 1 mg  1 mg Oral BID Salley Scarlet, MD   1  mg at 05/12/20 0737  . magnesium hydroxide (MILK OF MAGNESIA) suspension 30 mL  30 mL Oral Daily PRN [name], John T, MD      . melatonin tablet 5 mg  5 mg Oral QHS PRN [name], [name], MD   5 mg at 05/11/20 2047  . midodrine (PROAMATINE) tablet 10 mg  10 mg Oral TID AC [name], John T, MD   10 mg at 05/12/20 0736  . nicotine (NICODERM CQ - dosed in mg/24 hours) patch 21 mg  21 mg Transdermal Daily [name], [name], MD  21 mg at 05/12/20 0736  . OLANZapine zydis (ZYPREXA) disintegrating tablet 10 mg  10 mg Oral QID PRN Rulon Sera, MD   10 mg at 05/11/20 0748  . polyethylene glycol (MIRALAX / GLYCOLAX) packet 17 g  17 g Oral Daily PRN [name], John T, MD      . traZODone (DESYREL) tablet 100 mg  100 mg Oral QHS PRN Caroline Sauger, NP   100 mg at 05/11/20 2049  . ziprasidone (GEODON) injection 20 mg  20 mg Intramuscular Q4H PRN Rulon Sera, MD   20 mg at 05/11/20 2102  . zolpidem (AMBIEN) tablet 5 mg  5 mg Oral QHS Salley Scarlet, MD   5 mg at 05/11/20 2051    Lab Results:  Results for orders placed or performed during the hospital encounter of 05/07/20 (from the past 48 hour(s))  Valproic acid level     Status: Abnormal   Collection Time: 05/12/20  6:49 AM  Result Value Ref Range   Valproic Acid Lvl 38 (L) 50.0 - 100.0 ug/mL    Comment: Performed at [facility], [address], [place], [place] [address]    Blood Alcohol level:  Lab Results  Component Value Date   [facility] <10 04/30/2020   ETH <10 78/46/9629    Metabolic Disorder Labs: Lab Results  Component Value Date   HGBA1C 5.4 09/30/2016   MPG 108.28 09/30/2016   MPG 114 09/17/2016   Lab Results  Component Value Date   PROLACTIN 11.8 05/01/2020   Lab Results  Component Value Date   CHOL 135 09/30/2016   TRIG 104 02/25/2018   HDL 60 09/30/2016   CHOLHDL 2.3 09/30/2016   VLDL 12 09/30/2016   LDLCALC 63 09/30/2016    Physical Findings: AIMS: Facial and Oral Movements Muscles of Facial Expression:  None, normal Lips and Perioral Area: None, normal Jaw: None, normal Tongue: Moderate,Extremity Movements Upper (arms, wrists, hands, fingers): None, normal Lower (legs, knees, ankles, toes): None, normal, Trunk Movements Neck, shoulders, hips: None, normal, Overall Severity Severity of abnormal movements (highest score from questions above): None, normal Incapacitation due to abnormal movements: None, normal Patient's awareness of abnormal movements (rate only patient's report): No Awareness, Dental Status Current problems with teeth and/or dentures?: No Does patient usually wear dentures?: No  CIWA:    COWS:     Musculoskeletal: Strength & Muscle Tone: within normal limits Gait & Station: unsteady Patient leans: N/A  Psychiatric Specialty Exam:  Presentation  General Appearance: Casual  Eye Contact:Fair  Speech:Garbled  Speech Volume:Increased  Handedness:Right   Mood and Affect  Mood:Euthymic  Affect:Constricted   Thought Process  Thought Processes:Goal Directed  Descriptions of Associations:Loose  Orientation:-- (Oriented to person and place)  Thought Content:Perseveration  History of Schizophrenia/Schizoaffective disorder:No data recorded Duration of Psychotic Symptoms:No data recorded Hallucinations:Hallucinations: None  Ideas of Reference:None  Suicidal Thoughts:Suicidal Thoughts: No  Homicidal Thoughts:Homicidal Thoughts: No   Sensorium  Memory:Immediate Poor; Recent Poor; Remote Poor  Judgment:Impaired  Insight:Lacking   Executive Functions  Concentration:Poor  Attention Span:Poor  Recall:Poor  Fund of Knowledge:Poor  Language:Poor   Psychomotor Activity  Psychomotor Activity:Psychomotor Activity: Increased   Assets  Assets:Desire for Improvement; Financial Resources/Insurance; Social Support   Sleep  Sleep:Sleep: Fair Number of Hours of Sleep: 6.5    Physical Exam: Physical Exam  ROS  Blood pressure 117/88,  pulse (!) 104, temperature 97.7 F (36.5 C), temperature source Oral, resp. rate 18, height 5\' 11"  (1.803 m), weight 75.8 kg, SpO2 99 %.  Body mass index is 23.29 kg/m.   Treatment Plan Summary: Daily contact with patient to assess and evaluate symptoms and progress in treatment and Medication management   1-2)Schizoaffective disorder, bipolar type; moderate ID (IQ 48)- behavioral disturbance, agitation - Increase Clozaril 100 mg QHS, daily CBC for ANC checks. Will titrate back to home dose of 300 mg QHS as able -Valproic acid level 38 on Depakote 500 mg TID, increase Depakote 1000 mg BID, check level again on 3/26 - Continue Haldol 10 mg BID - Ativan 1 mg BID - Ambien 5 mg QHS  3) Mild asthma without complication- established problem, controlled - Albutrol PRN  4) Slow transit constipation- established problem, controlled  - Colace 100 mg BID, milk of magnesia PRN  5) Orthostatic hypotension- established problem, controlled - Midodrine 10 mg TID before meals  6) Tobacco Use disorder - Nicoderm 21 mg patch   7) Unspecified dysphagia- patient chocked on food on 3/20 - Spoke with speech therapy this morning whom recommends continuing dysphagia 3 diet with all level liquids. Should patient choke on food with level 3, continue to advance to level 2 or 1 until patient no longer coughing when eating. Likewise, if patient noted to be coughing with thin liquids can advance to nectar thick liquids.   Salley Scarlet, MD 05/12/2020, 11:34 AM

## 2020-05-12 NOTE — Progress Notes (Signed)
Patient is now slamming his room door up against the wall. He has turned his door in the opposite direction banging it into the wall. The screws have started coming out of the door where the window slit it. Patient slid the screws under the double doors and staff picked them up and took them into the nurse's station.

## 2020-05-12 NOTE — Progress Notes (Signed)
1:1 Patient Hourly Rounding  1000: Patient is asleep, with his assigned safety sitter present at bedside.  1400: Patient is now sleeping, with his assigned safety sitter present at bedside.  1800: Patient is standing at the double doors, with his assigned safety sitter present.

## 2020-05-12 NOTE — Progress Notes (Signed)
Patient is sleeping, due to earlier PRN medication. MD informed this writer to hold scheduled evening medication until patient wakes up.

## 2020-05-12 NOTE — Progress Notes (Signed)
SLP Cancellation Note  Patient Details Name: Austin Gates MRN: [id number] DOB: [birth year]   Cancelled treatment:       Reason Eval/Treat Not Completed:  (chart reviewed; consulted NSG and MD re: pt's status). Per chart review, pt has been by ST services in 2020 w/ MBSS (x2) revealing pharyngeal phase Dysphagia and recommendation for thickened liquids. Unsure if pt has been drinking thickened liquids since last MBSS in 2020. MD unsure. Currently at this admit, pt's 2 CXRs revealed "No active disease"; he is afebrile and WBC was WNL.  Pt was on a Regular diet at admit; had a choking incident w/ a solid food and diet was modified to a Mech Soft. Also of note, is pt's Psychiatric behavior/presentation which include Significant agitation and aggression. This may have an impact on his internal awareness and insight when eating/drinking. Any Impulsive eating/drinking behaviors can increase risk for choking/aspiration. Pt has been swallowing his medications w/ NSG, and eats ice cream w/out difficulty.  Per discussion w/ MD, recommend continuing current diet w/ downgrade to a level 2(MINCED foods w/ Purees) if needed; Pills Crushed in a Puree if needed. General aspiration precautions and given po's only when Calm.  ST services can be available for re-consult if necessary. MD and NSG agreed.      Orinda Kenner, MS, CCC-SLP Speech Language Pathologist Rehab Services [phone] [facility] 05/12/2020, 2:50 PM

## 2020-05-12 NOTE — BHH Group Notes (Signed)

## 2020-05-13 LAB — CBC WITH DIFFERENTIAL/PLATELET
Abs Immature Granulocytes: 0.04 10*3/uL (ref 0.00–0.07)
Basophils Absolute: 0 10*3/uL (ref 0.0–0.1)
Basophils Relative: 1 %
Eosinophils Absolute: 0.1 10*3/uL (ref 0.0–0.5)
Eosinophils Relative: 1 %
HCT: 37.7 % — ABNORMAL LOW (ref 39.0–52.0)
Hemoglobin: 11.8 g/dL — ABNORMAL LOW (ref 13.0–17.0)
Immature Granulocytes: 1 %
Lymphocytes Relative: 17 %
Lymphs Abs: 1.1 10*3/uL (ref 0.7–4.0)
MCH: 29.2 pg (ref 26.0–34.0)
MCHC: 31.3 g/dL (ref 30.0–36.0)
MCV: 93.3 fL (ref 80.0–100.0)
Monocytes Absolute: 0.4 10*3/uL (ref 0.1–1.0)
Monocytes Relative: 6 %
Neutro Abs: 4.8 10*3/uL (ref 1.7–7.7)
Neutrophils Relative %: 74 %
Platelets: 161 10*3/uL (ref 150–400)
RBC: 4.04 MIL/uL — ABNORMAL LOW (ref 4.22–5.81)
RDW: 15.1 % (ref 11.5–15.5)
WBC: 6.4 10*3/uL (ref 4.0–10.5)
nRBC: 0.3 % — ABNORMAL HIGH (ref 0.0–0.2)

## 2020-05-13 MED ORDER — CLOZAPINE 25 MG PO TABS
125.0000 mg | ORAL_TABLET | Freq: Every day | ORAL | Status: DC
  Administered 2020-05-14: 125 mg via ORAL
  Filled 2020-05-13: qty 1

## 2020-05-13 NOTE — Progress Notes (Signed)
Patient remains labile with aggressive behavior toward staff. Banging on the door cursing at staff,  periodically demanding things like [employer] and [place].  He is redirectable, but only for short periods of time.  He received his QHS medication and tolerated without incident. He remains with 1:1 sitter and will continue to be monitored for safety and change in disposition.      Cleo Butler-Nicholson, LPN
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
## 2020-05-13 NOTE — BHH Counselor (Signed)
CSW contacted [facility] to check on bed availability.  CSW left HIPAA compliant voicemail requesting a return phone call.  Assunta Curtis, MSW, LCSW 05/13/2020 1:22 PM

## 2020-05-13 NOTE — Plan of Care (Signed)
  Problem: Disruptive/Impulsive Goal: STG - Patient will focus on task/topic with 2 prompts from staff within 5 recreation therapy group sessions Description: STG - Patient will focus on task/topic with 2 prompts from staff within 5 recreation therapy group sessions Outcome: Not Progressing

## 2020-05-13 NOTE — BHH Counselor (Signed)
Mr. Austin Gates called back to update that he has no male beds available at this time.   ]Arafat Cocuzza Delphina Cahill, MSW, LCSW 05/13/2020 1:29 PM

## 2020-05-13 NOTE — Progress Notes (Signed)
Recreation Therapy Notes  Date: 05/13/2020  Time: 10:00 am   Location: Craft room     Behavioral response: N/A   Intervention Topic: Happiness    Discussion/Intervention: Patient did not attend group.   Clinical Observations/Feedback:  Patient did not attend group.   Shaverence [name] LRT/CTRS        Shaverence  [name] 05/13/2020 12:36 PM

## 2020-05-13 NOTE — Tx Team (Signed)
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 acetaminophen (TYLENOL) tablet 650 mg  650 mg Oral Q6H PRN [name], John T, MD      . albuterol (VENTOLIN HFA) 108 (90 Base) MCG/ACT inhaler 2 puff  2 puff Inhalation Q4H PRN [name], John T, MD      . alum & mag hydroxide-simeth (MAALOX/MYLANTA) 200-200-20 MG/5ML suspension 30 mL  30 mL Oral Q4H PRN [name], John T, MD      . benztropine (COGENTIN) tablet 0.5 mg  0.5 mg Oral BID [name], John T, MD   0.5 mg at 05/13/20 0809  . clopidogrel (PLAVIX) tablet 75 mg  75 mg Oral Daily Salley Scarlet, MD   75 mg at 05/13/20 0809  . cloZAPine (CLOZARIL) tablet 100 mg  100 mg Oral Daily Salley Scarlet, MD   100 mg at 05/13/20 0809  . desmopressin (DDAVP) tablet 0.2 mg  0.2 mg Oral QHS Selina Cooley M, MD   0.2 mg at 05/12/20 2140  . haloperidol lactate (HALDOL) injection 5 mg  5 mg Intramuscular QID PRN Rulon Sera, MD   5 mg at 05/12/20 2138   And  . LORazepam (ATIVAN) injection 2 mg  2 mg Intramuscular Q4H PRN Rulon Sera, MD   2 mg at 05/12/20 2136   And  . diphenhydrAMINE (BENADRYL) injection 50 mg  50 mg Intramuscular Q4H PRN Rulon Sera, MD   50 mg at 05/12/20 2139  . divalproex (DEPAKOTE) DR tablet 1,000 mg  1,000 mg Oral Q12H Salley Scarlet, MD   1,000 mg at 05/13/20 0809  . docusate sodium (COLACE) capsule 100 mg  100 mg Oral BID PRN [name], John T, MD      . feeding supplement (ENSURE ENLIVE / ENSURE PLUS) liquid  237 mL  237 mL Oral BID BM [name], John T, MD   237 mL at 05/12/20 1100  . ferrous sulfate tablet 325 mg  325 mg Oral Q breakfast [name], [name], MD   325 mg at 05/13/20 0809  . food thickener (THICK IT) powder   Oral PRN [name], John T, MD      . haloperidol (HALDOL) tablet 10 mg  10 mg Oral BID [name], John T, MD   10 mg at 05/13/20 0810  . haloperidol lactate (HALDOL) injection 5 mg  5 mg Intramuscular Once Rulon Sera, MD      . LORazepam (ATIVAN) tablet 1 mg  1 mg Oral BID Salley Scarlet, MD   1 mg at 05/13/20 0810  . magnesium hydroxide (MILK OF MAGNESIA) suspension 30 mL  30 mL Oral Daily PRN [name], John T, MD      . melatonin tablet 5 mg  5 mg Oral QHS PRN [name], [name], MD   5 mg at 05/11/20 2047  . midodrine (PROAMATINE) tablet 10 mg  10 mg Oral TID AC [name], [name], MD   10 mg at 05/13/20 0810  . nicotine (NICODERM CQ - dosed in mg/24 hours) patch 21 mg  21 mg Transdermal Daily [name], John T,  MD   21 mg at 05/13/20 0813  . OLANZapine zydis (ZYPREXA) disintegrating tablet 10 mg  10 mg Oral QID PRN Rulon Sera, MD   10 mg at 05/11/20 0748  . polyethylene glycol (MIRALAX / GLYCOLAX) packet 17 g  17 g Oral Daily PRN [name], John T, MD      . traZODone (DESYREL) tablet 100 mg  100 mg Oral QHS PRN Caroline Sauger, NP   100 mg at 05/11/20 2049  . ziprasidone (GEODON) injection 20 mg  20 mg Intramuscular Q4H PRN Rulon Sera, MD   20 mg at 05/12/20 1538  . zolpidem (AMBIEN) tablet 5 mg  5 mg Oral QHS Salley Scarlet, MD   5 mg at 05/12/20 2140   PTA Medications: Medications Prior to Admission  Medication Sig Dispense Refill Last Dose  . benztropine (COGENTIN) 0.5 MG tablet Take 0.5 mg by mouth 2 (two) times daily.   05/07/2020 at Unknown time  . divalproex (DEPAKOTE) 500 MG DR tablet Take 500 mg by mouth 3 (three) times daily.     . Ensure (ENSURE) Take 237 mLs by mouth in the morning and at bedtime.     . ferrous sulfate 325 (65 FE) MG tablet Take 325 mg by mouth daily  with breakfast.     . food thickener (THICK IT) POWD Take by mouth in the morning, at noon, and at bedtime.     Marland Kitchen lactulose (CHRONULAC) 10 GM/15ML solution Take 20 g by mouth daily.     . melatonin 3 MG TABS tablet Take 3 mg by mouth at bedtime as needed (sleep).     . midodrine (PROAMATINE) 10 MG tablet Take 10 mg by mouth 3 (three) times daily.       Patient Stressors: Health problems Marital or family conflict  Patient Strengths: Supportive family/friends  Treatment Modalities: Medication Management, Group therapy, Case management,  1 to 1 session with clinician, Psychoeducation, Recreational therapy.   Physician Treatment Plan for Primary Diagnosis: Schizoaffective disorder, bipolar type ([place]) Long Term Goal(s): Improvement in symptoms so as ready for discharge Improvement in symptoms so as ready for discharge   Short Term Goals: Ability to identify changes in lifestyle to reduce recurrence of condition will improve Ability to verbalize feelings will improve Ability to disclose and discuss suicidal ideas Ability to demonstrate self-control will improve Ability to identify and develop effective coping behaviors will improve Ability to maintain clinical measurements within normal limits will improve Compliance with prescribed medications will improve Ability to identify changes in lifestyle to reduce recurrence of condition will improve Ability to verbalize feelings will improve Ability to disclose and discuss suicidal ideas Ability to demonstrate self-control will improve Ability to identify and develop effective coping behaviors will improve Ability to maintain clinical measurements within normal limits will improve Compliance with prescribed medications will improve Ability to identify triggers associated with substance abuse/mental health issues will improve  Medication Management: Evaluate patient's response, side effects, and tolerance of medication regimen.  Therapeutic  Interventions: 1 to 1 sessions, Unit Group sessions and Medication administration.  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   Hypotension   Mild asthma without complication   Slow transit constipation  Long Term Goal(s): Improvement in symptoms so as ready for discharge Improvement in symptoms so as ready for discharge   Short Term Goals:  Ability to identify changes in lifestyle to reduce recurrence of condition will improve Ability to verbalize feelings will improve Ability to disclose and discuss suicidal ideas Ability to demonstrate self-control will improve Ability to identify and develop effective coping behaviors will improve Ability to maintain clinical measurements within normal limits will improve Compliance with prescribed medications will improve Ability to identify changes in lifestyle to reduce recurrence of condition will improve Ability to verbalize feelings will improve Ability to disclose and discuss suicidal ideas Ability to demonstrate self-control will improve Ability to identify and develop effective coping behaviors will improve Ability to maintain clinical measurements within normal limits will improve Compliance with prescribed medications will improve Ability to identify triggers associated with substance abuse/mental health issues will improve     Medication Management: Evaluate patient's response, side effects, and tolerance of medication regimen.  Therapeutic Interventions: 1 to 1 sessions, Unit Group sessions and Medication administration.  Evaluation of Outcomes: Not Progressing   RN Treatment Plan for Primary Diagnosis: Schizoaffective disorder, bipolar type ([place]) Long Term Goal(s): Knowledge of disease and therapeutic regimen to maintain health will  improve  Short Term Goals: Ability to remain free from injury will improve, Ability to verbalize frustration and anger appropriately will improve, Ability to demonstrate self-control, Ability to participate in decision making will improve, Ability to verbalize feelings will improve, Ability to identify and develop effective coping behaviors will improve and Compliance with prescribed medications will improve  Medication Management: RN will administer medications as ordered by provider, will assess and evaluate patient's response and provide education to patient for prescribed medication. RN will report any adverse and/or side effects to prescribing provider.  Therapeutic Interventions: 1 on 1 counseling sessions, Psychoeducation, Medication administration, Evaluate responses to treatment, Monitor vital signs and CBGs as ordered, Perform/monitor CIWA, COWS, AIMS and Fall Risk screenings as ordered, Perform wound care treatments as ordered.  Evaluation of Outcomes: Not Progressing   LCSW Treatment Plan for Primary Diagnosis: Schizoaffective disorder, bipolar type ([place]) Long Term Goal(s): Safe transition to appropriate next level of care at discharge, Engage patient in therapeutic group addressing interpersonal concerns.  Short Term Goals: Engage patient in aftercare planning with referrals and resources, Increase social support, Increase ability to appropriately verbalize feelings, Increase emotional regulation, Facilitate acceptance of mental health diagnosis and concerns, Identify triggers associated with mental health/substance abuse issues and Increase skills for wellness and recovery  Therapeutic Interventions: Assess for all discharge needs, 1 to 1 time with Social worker, Explore available resources and support systems, Assess for adequacy in community support network, Educate family and significant other(s) on suicide prevention, Complete Psychosocial Assessment, Interpersonal group  therapy.  Evaluation of Outcomes: Not Progressing   Progress in Treatment: Attending groups: No. Participating in groups: No. Taking medication as prescribed: Yes. Toleration medication: Yes. Family/Significant other contact made: Yes, individual(s) contacted:  [name], guardian/sister was contacted. Patient understands diagnosis: No. Discussing patient identified problems/goals with staff: Yes. Medical problems stabilized or resolved: Yes. Denies suicidal/homicidal ideation: Yes. Issues/concerns per patient self-inventory: No. Other: None.  New problem(s) identified: No, Describe:  none.  New Short Term/Long Term Goal(s): medication management for mood stabilization; elimination of SI thoughts; development of comprehensive mental wellness. Update 05/13/20: No changes at this time.  Patient Goals: "I want to go home." Update 05/13/20: No changes at this time.  Discharge Plan or Barriers: CSW will assist with development of an appropriate discharge/aftercare plan. Pt will needed placement and has a guardian. Update 05/13/20: Pt behavior has become increasingly aggressive.  Referral has been made to [facility] and pt is on waiting list for [facility].  Reason for Continuation of Hospitalization: Aggression Medical Issues Medication stabilization  Estimated Length of Stay: TBD  Attendees: Patient:  05/13/2020 9:06 AM  Physician: Selina Cooley, MD 05/13/2020 9:06 AM  Nursing:  05/13/2020 9:06 AM  RN Care Manager: 05/13/2020 9:06 AM  Social Worker: Chalmers Guest. Guerry Bruin, MSW, [place], [place] 05/13/2020 9:06 AM  Recreational Therapist: 05/13/2020 9:06 AM  Other: Assunta Curtis, MSW, LCSW 05/13/2020 9:06 AM  Other: Kiva Martinique, MSW, LCSW-A 05/13/2020 9:06 AM  Other: 05/13/2020 9:06 AM    Scribe for Treatment Team: Shirl Harris, LCSW 05/13/2020 9:06 AM
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
## 2020-05-13 NOTE — Progress Notes (Signed)
Patient in bed asleep, he continues to be on 1:1 no distress noted,n15 minutes safety checks maintained.

## 2020-05-13 NOTE — [facility] Group Notes (Signed)
LCSW Group Therapy Note  05/13/2020 2:12 PM  Type of Therapy/Topic:  Group Therapy:  Emotion Regulation  Participation Level:  Did Not Attend   Description of Group:   The purpose of this group is to assist patients in learning to regulate negative emotions and experience positive emotions. Patients will be guided to discuss ways in which they have been vulnerable to their negative emotions. These vulnerabilities will be juxtaposed with experiences of positive emotions or situations, and patients will be challenged to use positive emotions to combat negative ones. Special emphasis will be placed on coping with negative emotions in conflict situations, and patients will process healthy conflict resolution skills.  Therapeutic Goals: 1. Patient will identify two positive emotions or experiences to reflect on in order to balance out negative emotions 2. Patient will label two or more emotions that they find the most difficult to experience 3. Patient will demonstrate positive conflict resolution skills through discussion and/or role plays  Summary of Patient Progress: X  Therapeutic Modalities:   Cognitive Behavioral Therapy Feelings Identification Dialectical Behavioral Therapy  Austin Gates R. Guerry Bruin, MSW, LCSW, [place] 05/13/2020 2:12 PM

## 2020-05-13 NOTE — Progress Notes (Signed)
Patient alert and oriented x 4, affect is blunted, thoughts are disorganized and sometimes incoherent, speech is tangential, he appears agitated and argumentative, he was placed on 1:1 for aggressive behavior. Patient given scheduled medication regimen hej was receptive and additional PRN medication for agitation. Patient was offered emotional support, 15 minutes safety checks maintained will continue to monitor.
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
## 2020-05-13 NOTE — Plan of Care (Signed)
  Problem: Education: Goal: Knowledge of [facility] Education information/materials will improve Outcome: Not Progressing Goal: Emotional status will improve Outcome: Not Progressing Goal: Mental status will improve Outcome: Not Progressing Goal: Verbalization of understanding the information provided will improve Outcome: Not Progressing   Problem: Activity: Goal: Interest or engagement in activities will improve Outcome: Not Progressing Goal: Sleeping patterns will improve Outcome: Not Progressing   Problem: Coping: Goal: Ability to verbalize frustrations and anger appropriately will improve Outcome: Not Progressing Goal: Ability to demonstrate self-control will improve Outcome: Not Progressing   Problem: Health Behavior/Discharge Planning: Goal: Identification of resources available to assist in meeting health care needs will improve Outcome: Not Progressing Goal: Compliance with treatment plan for underlying cause of condition will improve Outcome: Not Progressing   Problem: Physical Regulation: Goal: Ability to maintain clinical measurements within normal limits will improve Outcome: Not Progressing   Problem: Safety: Goal: Periods of time without injury will increase Outcome: Not Progressing   Problem: Education: Goal: Ability to verbalize precipitating factors for violent behavior will improve Outcome: Not Progressing   Problem: Coping: Goal: Ability to verbalize frustrations and anger appropriately will improve Outcome: Not Progressing   Problem: Health Behavior/Discharge Planning: Goal: Ability to implement measures to prevent violent behavior in the future will improve Outcome: Not Progressing   Problem: Safety: Goal: Ability to demonstrate self-control will improve Outcome: Not Progressing Goal: Ability to redirect hostility and anger into socially appropriate behaviors will improve Outcome: Not Progressing

## 2020-05-14 MED ORDER — CLOZAPINE 25 MG PO TABS
150.0000 mg | ORAL_TABLET | Freq: Every day | ORAL | Status: DC
  Administered 2020-05-15: 150 mg via ORAL
  Filled 2020-05-14: qty 1
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
## 2020-05-14 NOTE — [facility] Counselor (Addendum)
Addendum: CSW sent the information as requested.  CSW received confirmation that the fax was successful.  CSW resent the information to [place] and received confirmation that the fax was successful.     CSW called to refer patient to the following  [facility], [phone]:  Pt denied for aggression, CSW spoke with [facility], [phone]:  Call disconnected, CSW left HIPAA compliant voicemail earlier  [facility]: requested that all notes, labs, medications, IVC pw, demographics be sent to [facility]: requested that snapshot (H&P, demo, labs, notes) of pt's admission be sent to [phone]  CSW faxed the requested information.  Assunta Curtis, MSW, LCSW 05/14/2020 2:11 PM

## 2020-05-14 NOTE — Progress Notes (Addendum)
[facility] MD Progress Note  05/14/2020 1:40 PM Austin Gates  MRN:  [id number]   CC "I have a car."  Subjective:  60 year old male with schizoaffective disorder, bipolar type and moderate ID (IQ 61) presenting with increased agitation and aggression. Overnight patient received PRNs for acute agitation and attempt to strike MHT. 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 PRNs provided this morning as well. He has been medication compliant. ADLs impaired  Patient seen today with MHT. He is eating lunch very quickly, but was not coughing or showing compromised airway in any way. He notes he has a car, and is ready to leave today. He is unable to grasp that he does not have a place to live. Informed patient of plan to get EKG today, and adjust his medication. Patient continues to focus on wanting to leave, get [employer], and playing basketball. Speech remains dysarthric and very difficult to understand. He denies suicidal ideations, homicidal ideations, visual hallucinations, and auditory hallucinations. He does not appear to be responding to internal stimuli.   Principal Problem: Schizoaffective disorder, bipolar type ([place]) Diagnosis: Principal Problem:   Schizoaffective disorder, bipolar type (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 Hypertension   . Intellectual disability   . Obsessive-compulsive disorder   . Schizo-affective schizophrenia [facility])     Past Surgical History:  Procedure Laterality Date  . COLONOSCOPY WITH PROPOFOL N/A 12/28/2017   Procedure: COLONOSCOPY WITH PROPOFOL;  Surgeon: Jonathon Bellows, MD;  Location: [facility] ENDOSCOPY;  Service: Gastroenterology;   Laterality: N/A;  . HYDROCELE EXCISION Bilateral 02/22/2018   Procedure: bilateral HYDROCELECTOMY ADULT;  Surgeon: Cleon Gustin, MD;  Location: [place];  Service: Urology;  Laterality: Bilateral;  . INSERTION OF ILIAC STENT Left 02/22/2018   Procedure: INSERTION LEFT SUPERIOR FEMORAL ARTERY USING 6MM X 5CM VIABHON STENT with mynx device closure on right femoral artery;  Surgeon: Waynetta Sandy, MD;  Location: [place];  Service: Vascular;  Laterality: Left;  . KNEE ARTHROSCOPY    . SCROTAL EXPLORATION N/A 02/22/2018   Procedure: SCROTUM EXPLORATION;  Surgeon: Cleon Gustin, MD;  Location: [place];  Service: Urology;  Laterality: N/A;  . UPPER EXTREMITY ANGIOGRAM Left 02/22/2018   Procedure: left lower EXTREMITY ANGIOGRAM;  Surgeon: Waynetta Sandy, MD;  Location: [facility] OR;  Service: Vascular;  Laterality: Left;   Family History:  Family History  Problem Relation Age of Onset  . Diabetes Mother    Family Psychiatric  History: See H&P Social History:  Social History   Substance and Sexual Activity  Alcohol Use No     Social History   Substance and Sexual Activity  Drug Use No    Social History   Socioeconomic History  . Marital status: Single    Spouse name: Not on file  . Number of children: Not on file  . Years of education: Not on file  . Highest education level: Not on file  Occupational History  . Occupation: Disabled  Tobacco Use  . Smoking status: Former Smoker    Types: Cigarettes  . Smokeless tobacco: Never Used  Vaping Use  . Vaping Use: Unknown  Substance and Sexual Activity  .  Alcohol use: No  . Drug use: No  . Sexual activity: Never  Other Topics Concern  . Not on file  Social History Narrative   ** Merged History Encounter **       Social Determinants of Health   Financial Resource Strain: Not on file  Food Insecurity: Not on file  Transportation Needs: Not on file  Physical Activity: Not on file  Stress: Not on file  Social  Connections: Not on file   Additional Social History:     Sleep: Fair  Appetite:  Good  Current Medications: Current Facility-Administered Medications  Medication Dose Route Frequency Provider Last Rate Last Admin  . acetaminophen (TYLENOL) tablet 650 mg  650 mg Oral Q6H PRN [name], John T, MD      . albuterol (VENTOLIN HFA) 108 (90 Base) MCG/ACT inhaler 2 puff  2 puff Inhalation Q4H PRN [name], John T, MD      . alum & mag hydroxide-simeth (MAALOX/MYLANTA) 200-200-20 MG/5ML suspension 30 mL  30 mL Oral Q4H PRN [name], John T, MD      . benztropine (COGENTIN) tablet 0.5 mg  0.5 mg Oral BID [name], John T, MD   0.5 mg at 05/14/20 0740  . clopidogrel (PLAVIX) tablet 75 mg  75 mg Oral Daily Salley Scarlet, MD   75 mg at 05/14/20 0739  . [START ON 05/15/2020] cloZAPine (CLOZARIL) tablet 150 mg  150 mg Oral Daily Salley Scarlet, MD      . desmopressin (DDAVP) tablet 0.2 mg  0.2 mg Oral QHS Selina Cooley M, MD   0.2 mg at 05/13/20 2128  . haloperidol lactate (HALDOL) injection 5 mg  5 mg Intramuscular QID PRN Rulon Sera, MD   5 mg at 05/13/20 1753   And  . LORazepam (ATIVAN) injection 2 mg  2 mg Intramuscular Q4H PRN Rulon Sera, MD   2 mg at 05/13/20 1754   And  . diphenhydrAMINE (BENADRYL) injection 50 mg  50 mg Intramuscular Q4H PRN Rulon Sera, MD   50 mg at 05/13/20 1753  . divalproex (DEPAKOTE) DR tablet 1,000 mg  1,000 mg Oral Q12H Salley Scarlet, MD   1,000 mg at 05/14/20 0739  . docusate sodium (COLACE) capsule 100 mg  100 mg Oral BID PRN [name], John T, MD      . feeding supplement (ENSURE ENLIVE / ENSURE PLUS) liquid 237 mL  237 mL Oral BID BM [name], John T, MD   237 mL at 05/12/20 1100  . ferrous sulfate tablet 325 mg  325 mg Oral Q breakfast [name], [name], MD   325 mg at 05/14/20 0739  . food thickener (THICK IT) powder   Oral PRN [name], John T, MD      . haloperidol (HALDOL) tablet 10 mg  10 mg Oral BID [name], [name], MD   10 mg at 05/14/20 7616  .  haloperidol lactate (HALDOL) injection 5 mg  5 mg Intramuscular Once Rulon Sera, MD      . LORazepam (ATIVAN) tablet 1 mg  1 mg Oral BID Salley Scarlet, MD   1 mg at 05/14/20 0740  . magnesium hydroxide (MILK OF MAGNESIA) suspension 30 mL  30 mL Oral Daily PRN [name], John T, MD      . melatonin tablet 5 mg  5 mg Oral QHS PRN [name], [name], MD   5 mg at 05/13/20 2128  . midodrine (PROAMATINE) tablet 10 mg  10 mg Oral TID AC [name], John  T, MD   10 mg at 05/14/20 0739  . nicotine (NICODERM CQ - dosed in mg/24 hours) patch 21 mg  21 mg Transdermal Daily [name], [name], MD   21 mg at 05/13/20 0813  . OLANZapine zydis (ZYPREXA) disintegrating tablet 10 mg  10 mg Oral QID PRN Rulon Sera, MD   10 mg at 05/13/20 2130  . polyethylene glycol (MIRALAX / GLYCOLAX) packet 17 g  17 g Oral Daily PRN [name], [name], MD      . traZODone (DESYREL) tablet 100 mg  100 mg Oral QHS PRN Caroline Sauger, NP   100 mg at 05/13/20 2129  . ziprasidone (GEODON) injection 20 mg  20 mg Intramuscular Q4H PRN Rulon Sera, MD   20 mg at 05/14/20 0719  . zolpidem (AMBIEN) tablet 5 mg  5 mg Oral QHS Salley Scarlet, MD   5 mg at 05/13/20 2129    Lab Results:  Results for orders placed or performed during the hospital encounter of 05/07/20 (from the past 48 hour(s))  CBC with Differential/Platelet     Status: Abnormal   Collection Time: 05/13/20  9:32 AM  Result Value Ref Range   WBC 6.4 4.0 - 10.5 K/uL   RBC 4.04 (L) 4.22 - 5.81 MIL/uL   Hemoglobin 11.8 (L) 13.0 - 17.0 g/dL   HCT 37.7 (L) 39.0 - 52.0 %   MCV 93.3 80.0 - 100.0 fL   MCH 29.2 26.0 - 34.0 pg   MCHC 31.3 30.0 - 36.0 g/dL   RDW 15.1 11.5 - 15.5 %   Platelets 161 150 - 400 K/uL   nRBC 0.3 (H) 0.0 - 0.2 %   Neutrophils Relative % 74 %   Neutro Abs 4.8 1.7 - 7.7 K/uL   Lymphocytes Relative 17 %   Lymphs Abs 1.1 0.7 - 4.0 K/uL   Monocytes Relative 6 %   Monocytes Absolute 0.4 0.1 - 1.0 K/uL   Eosinophils Relative 1 %   Eosinophils Absolute  0.1 0.0 - 0.5 K/uL   Basophils Relative 1 %   Basophils Absolute 0.0 0.0 - 0.1 K/uL   Immature Granulocytes 1 %   Abs Immature Granulocytes 0.04 0.00 - 0.07 K/uL    Comment: Performed at [facility], [address] [place], [place] [address]    Blood Alcohol level:  Lab Results  Component Value Date   [facility] <10 04/30/2020   ETH <10 60/45/4098    Metabolic Disorder Labs: Lab Results  Component Value Date   HGBA1C 5.4 09/30/2016   MPG 108.28 09/30/2016   MPG 114 09/17/2016   Lab Results  Component Value Date   PROLACTIN 11.8 05/01/2020   Lab Results  Component Value Date   CHOL 135 09/30/2016   TRIG 104 02/25/2018   HDL 60 09/30/2016   CHOLHDL 2.3 09/30/2016   VLDL 12 09/30/2016   LDLCALC 63 09/30/2016    Physical Findings: AIMS: Facial and Oral Movements Muscles of Facial Expression: None, normal Lips and Perioral Area: None, normal Jaw: None, normal Tongue: Moderate,Extremity Movements Upper (arms, wrists, hands, fingers): None, normal Lower (legs, knees, ankles, toes): None, normal, Trunk Movements Neck, shoulders, hips: None, normal, Overall Severity Severity of abnormal movements (highest score from questions above): None, normal Incapacitation due to abnormal movements: None, normal Patient's awareness of abnormal movements (rate only patient's report): No Awareness, Dental Status Current problems with teeth and/or dentures?: No Does patient usually wear dentures?: No  CIWA:    COWS:  Musculoskeletal: Strength & Muscle Tone: within normal limits Gait & Station: unsteady Patient leans: N/A  Psychiatric Specialty Exam:  Presentation  General Appearance: Casual  Eye Contact:Fair  Speech:Garbled  Speech Volume:Increased  Handedness:Right   Mood and Affect  Mood: Irritable Affect:Constricted   Thought Process  Thought Processes:Goal Directed  Descriptions of Associations:Loose  Orientation:-- (Oriented to person and  place)  Thought Content:Perseveration  History of Schizophrenia/Schizoaffective disorder:Yes Duration of Psychotic Symptoms:Greater than 6 months Hallucinations:Denies   Ideas of Reference:None  Suicidal Thoughts: Denies Homicidal Thoughts:Denies  Sensorium  Memory:Immediate Poor; Recent Poor; Remote Poor  Judgment:Impaired  Insight:Lacking   Executive Functions  Concentration:Poor  Attention Span:Poor  Recall:Poor  Fund of Knowledge:Poor  Language:Poor   Psychomotor Activity  Psychomotor Activity:Restless  Assets  Assets:Desire for Improvement; Financial Resources/Insurance; Social Support   Sleep  Sleep:Sleep: Fair Number of Hours of Sleep: 7    Physical Exam: Physical Exam  ROS  Blood pressure 130/78, pulse (!) 117, temperature 98.5 F (36.9 C), temperature source Oral, resp. rate 18, height 5\' 11"  (1.803 m), weight 75.8 kg, SpO2 98 %. Body mass index is 23.29 kg/m.   Treatment Plan Summary: Daily contact with patient to assess and evaluate symptoms and progress in treatment and Medication management   1-2)Schizoaffective disorder, bipolar type; moderate ID (IQ 48)- behavioral disturbance, agitation - Increase Clozaril 150 mg QHS, ANC 4800 yesterday, daily CBC for ANC checks. Will titrate back to home dose of 300 mg QHS as able -Valproic acid level 38 on Depakote 500 mg TID, increase Depakote 1000 mg BID, check level again on 3/26 - Continue Haldol 10 mg BID - Ativan 1 mg BID - Ambien 5 mg QHS  3) Mild asthma without complication- established problem, controlled - Albutrol PRN  4) Slow transit constipation- established problem, controlled  - Colace 100 mg BID, milk of magnesia PRN  5) Orthostatic hypotension- established problem, controlled - Midodrine 10 mg TID before meals  6) Tobacco Use disorder - Nicoderm 21 mg patch   7) Unspecified dysphagia- patient chocked on food on 3/20 - Continue dysphagia 3 diet with all level liquids.  No coughing or signs of aspiration at this time. Should patient choke on food with level 3, continue to advance to level 2 or 1 until patient no longer coughing when eating. Likewise, if patient noted to be coughing with thin liquids can advance to nectar thick liquids.   05/14/20: Psychiatric exam above reviewed and remains accurate. Assessment and plan above reviewed and updated.   Salley Scarlet, MD 05/14/2020, 1:40 PM

## 2020-05-14 NOTE — Progress Notes (Signed)
Recreation Therapy Notes  Date: 05/14/2020  Time: 9:30 am   Location: Craft room   Behavioral response: N/A   Intervention Topic: Animal Assisted therapy    Discussion/Intervention: Patient did not attend group.   Clinical Observations/Feedback:  Patient did not attend group.   Shaverence [name] LRT/CTRS          Shaverence  [name] 05/14/2020 1:21 PM

## 2020-05-14 NOTE — BHH Counselor (Signed)
CSW called to check on status of referral with [facility].  CSW was informed that referral has not been processed as Exception form has not been signed by [place].  CSW [place] to check on status of Exception form.  CSW was informed that form had not yet been reviewed.  CSW was able to have the form reviewed while on the phone, however, CSW was informed that additional information, not listed on the forms was needed.  CSW will gather the necessary materials and resend.    CSW was informed that the following needs to be sent: last 48 hours of notes from physician and nurses, IVC paperwork, medical clearance from the ER, write up of reason for hospitalization and H&P.  CSW was informed that referrals to other hospitals would need to be resubmitted.  CSW called [facility] and [facility] and has had to leave voicemails for both.  Assunta Curtis, MSW, LCSW 05/14/2020 1:42 PM

## 2020-05-14 NOTE — BHH Group Notes (Signed)
LCSW Group Therapy Note     05/14/2020 2:02 PM     Type of Therapy/Topic:  Group Therapy:  Balance in Life     Participation Level:  Did Not Attend     Description of Group:    This group will address the concept of balance and how it feels and looks when one is unbalanced. Patients will be encouraged to process areas in their lives that are out of balance and identify reasons for remaining unbalanced. Facilitators will guide patients in utilizing problem-solving interventions to address and correct the stressor making their life unbalanced. Understanding and applying boundaries will be explored and addressed for obtaining and maintaining a balanced life. Patients will be encouraged to explore ways to assertively make their unbalanced needs known to significant others in their lives, using other group members and facilitator for support and feedback.     Therapeutic Goals:  1.      Patient will identify two or more emotions or situations they have that consume much of in their lives.  2.      Patient will identify signs/triggers that life has become out of balance:  3.      Patient will identify two ways to set boundaries in order to achieve balance in their lives:  4.      Patient will demonstrate ability to communicate their needs through discussion and/or role plays     Summary of Patient Progress: X      Therapeutic Modalities:   Cognitive Behavioral Therapy  Solution-Focused Therapy  Assertiveness Training     Tereso Unangst [place] MSW, [place]  05/14/2020 2:02 PM

## 2020-05-15 MED ORDER — VALPROIC ACID 250 MG/5ML PO SOLN
500.0000 mg | Freq: Four times a day (QID) | ORAL | Status: DC
  Administered 2020-05-15 – 2020-05-17 (×7): 500 mg via ORAL
  Filled 2020-05-15 (×9): qty 10

## 2020-05-15 MED ORDER — CHLORPROMAZINE HCL 25 MG/ML IJ SOLN
25.0000 mg | Freq: Three times a day (TID) | INTRAMUSCULAR | Status: DC | PRN
  Administered 2020-05-16: 25 mg via INTRAMUSCULAR
  Filled 2020-05-15 (×2): qty 1

## 2020-05-15 MED ORDER — DIPHENHYDRAMINE HCL 50 MG/ML IJ SOLN: 25.0000 mg | Freq: Three times a day (TID) | INTRAMUSCULAR | Status: DC | PRN

## 2020-05-15 MED ORDER — CLOZAPINE 25 MG PO TABS
175.0000 mg | ORAL_TABLET | Freq: Every day | ORAL | Status: DC
  Administered 2020-05-16 – 2020-05-20 (×5): 175 mg via ORAL
  Filled 2020-05-15 (×5): qty 1

## 2020-05-15 NOTE — Progress Notes (Signed)
Pt safety is being maintained on a 1:1 with staff for safety. Pt is alert and oriented to person, place, but not to time or situation. Pt ate breakfast, is hyperactive, hypertalkative, childlike and easily irritable. Pt ate breakfast, and is medication compliant. Pt was cooperative with vital signs. Pt frequently inappropriately pushes the call light in his bathroom, but when staff approach to check on pt he cannot tell staff a reason for putting the call light on. At other times, pt forgets that he put it on, and will leave the bathroom and go into the dayroom and watch TV. No distress noted, none reported. Will continue to monitor pt per Q15 minute face checks and monitor for safety and progress.

## 2020-05-15 NOTE — Progress Notes (Signed)
Patient woke up loud, aggressive, yelling he was naked posturing and threatening staff, he is not receptive to staff, he was given medication for agitation, continues to be on 1:1 for safety, 15 minutes safety checks maintained will continue to monitor closely.

## 2020-05-15 NOTE — Progress Notes (Addendum)
[facility] MD Progress Note  05/15/2020 2:11 PM Austin Gates  MRN:  [id number]   CC "Can I leave today?"  Subjective:  60 year old male with schizoaffective disorder, bipolar type and moderate ID (IQ 28) presenting with increased agitation and aggression. Overnight patient received PRNs for acute agitation and aggression toward staff. He has been medication compliant. ADLs impaired  Patient seen today with security guard and MHT. He is fairly pleasant today, and continues to ask if he can go home. He states he has a place to stay, and mentions his old group home. He is reminded that he is not allowed to return to group home, but that we are seeking an alternative place to stay. He continues to ask for different food items, and was excited to hear that lunch would arrive within a few minutes or our meeting. He denies suicidal ideations, homicidal ideations, visual hallucinations, and auditory hallucinations. He does not appear to be responding to internal stimuli. Conversation terminated without any incident. However, within about 10 minutes patient was noted to be yelling and throwing trash can repeatedly at the hall. He was also yelling that he was going to harm staff. He was unable to be verbally redirected or calmed with food and was given PRN medications.    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 Hypertension   . Intellectual disability   . Obsessive-compulsive disorder   . Schizo-affective schizophrenia [facility])     Past Surgical History:  Procedure Laterality Date  . COLONOSCOPY WITH  PROPOFOL N/A 12/28/2017   Procedure: COLONOSCOPY WITH PROPOFOL;  Surgeon: Jonathon Bellows, MD;  Location: [facility] ENDOSCOPY;  Service: Gastroenterology;  Laterality: N/A;  . HYDROCELE EXCISION Bilateral 02/22/2018   Procedure: bilateral HYDROCELECTOMY ADULT;  Surgeon: Cleon Gustin, MD;  Location: [place];  Service: Urology;  Laterality: Bilateral;  . INSERTION OF ILIAC STENT Left 02/22/2018   Procedure: INSERTION LEFT SUPERIOR FEMORAL ARTERY USING 6MM X 5CM VIABHON STENT with mynx device closure on right femoral artery;  Surgeon: Waynetta Sandy, MD;  Location: [place];  Service: Vascular;  Laterality: Left;  . KNEE ARTHROSCOPY    . SCROTAL EXPLORATION N/A 02/22/2018   Procedure: SCROTUM EXPLORATION;  Surgeon: Cleon Gustin, MD;  Location: [place];  Service: Urology;  Laterality: N/A;  . UPPER EXTREMITY ANGIOGRAM Left 02/22/2018   Procedure: left lower EXTREMITY ANGIOGRAM;  Surgeon: Waynetta Sandy, MD;  Location: [facility] OR;  Service: Vascular;  Laterality: Left;   Family History:  Family History  Problem Relation Age of Onset  . Diabetes Mother    Family Psychiatric  History: See H&P Social History:  Social History   Substance and Sexual Activity  Alcohol Use No     Social History   Substance and Sexual Activity  Drug Use No    Social History   Socioeconomic History  . Marital status: Single    Spouse name: Not on file  . Number of children: Not on file  . Years of education: Not on file  . Highest education level: Not on file  Occupational History  . Occupation: Disabled  Tobacco Use  .  Smoking status: Former Smoker    Types: Cigarettes  . Smokeless tobacco: Never Used  Vaping Use  . Vaping Use: Unknown  Substance and Sexual Activity  . Alcohol use: No  . Drug use: No  . Sexual activity: Never  Other Topics Concern  . Not on file  Social History Narrative   ** Merged History Encounter **       Social Determinants of Health   Financial Resource Strain:  Not on file  Food Insecurity: Not on file  Transportation Needs: Not on file  Physical Activity: Not on file  Stress: Not on file  Social Connections: Not on file   Additional Social History:     Sleep: Fair  Appetite:  Good  Current Medications: Current Facility-Administered Medications  Medication Dose Route Frequency Provider Last Rate Last Admin  . acetaminophen (TYLENOL) tablet 650 mg  650 mg Oral Q6H PRN [name], John T, MD      . albuterol (VENTOLIN HFA) 108 (90 Base) MCG/ACT inhaler 2 puff  2 puff Inhalation Q4H PRN [name], John T, MD      . alum & mag hydroxide-simeth (MAALOX/MYLANTA) 200-200-20 MG/5ML suspension 30 mL  30 mL Oral Q4H PRN [name], John T, MD      . benztropine (COGENTIN) tablet 0.5 mg  0.5 mg Oral BID [name], [name], MD   0.5 mg at 05/15/20 0836  . clopidogrel (PLAVIX) tablet 75 mg  75 mg Oral Daily Salley Scarlet, MD   75 mg at 05/15/20 0836  . cloZAPine (CLOZARIL) tablet 150 mg  150 mg Oral Daily Salley Scarlet, MD   150 mg at 05/15/20 0836  . desmopressin (DDAVP) tablet 0.2 mg  0.2 mg Oral QHS Selina Cooley M, MD   0.2 mg at 05/14/20 2100  . haloperidol lactate (HALDOL) injection 5 mg  5 mg Intramuscular QID PRN Rulon Sera, MD   5 mg at 05/15/20 0021   And  . LORazepam (ATIVAN) injection 2 mg  2 mg Intramuscular Q4H PRN Rulon Sera, MD   2 mg at 05/15/20 0021   And  . diphenhydrAMINE (BENADRYL) injection 50 mg  50 mg Intramuscular Q4H PRN Rulon Sera, MD   50 mg at 05/15/20 0021  . divalproex (DEPAKOTE) DR tablet 1,000 mg  1,000 mg Oral Q12H Salley Scarlet, MD   1,000 mg at 05/15/20 0836  . docusate sodium (COLACE) capsule 100 mg  100 mg Oral BID PRN [name], John T, MD      . feeding supplement (ENSURE ENLIVE / ENSURE PLUS) liquid 237 mL  237 mL Oral BID BM [name], John T, MD   237 mL at 05/15/20 1013  . ferrous sulfate tablet 325 mg  325 mg Oral Q breakfast [name], [name], MD   325 mg at 05/15/20 0836  . food thickener (THICK IT) powder    Oral PRN [name], John T, MD      . haloperidol (HALDOL) tablet 10 mg  10 mg Oral BID [name], John T, MD   10 mg at 05/15/20 8882  . haloperidol lactate (HALDOL) injection 5 mg  5 mg Intramuscular Once Rulon Sera, MD      . LORazepam (ATIVAN) tablet 1 mg  1 mg Oral BID Salley Scarlet, MD   1 mg at 05/15/20 0836  . magnesium hydroxide (MILK OF MAGNESIA) suspension 30 mL  30 mL Oral Daily PRN [name], John T, MD      . melatonin tablet 5 mg  5  mg Oral QHS PRN [name], [name], MD   5 mg at 05/13/20 2128  . midodrine (PROAMATINE) tablet 10 mg  10 mg Oral TID AC [name], John T, MD   10 mg at 05/15/20 1228  . nicotine (NICODERM CQ - dosed in mg/24 hours) patch 21 mg  21 mg Transdermal Daily [name], [name], MD   21 mg at 05/15/20 0837  . OLANZapine zydis (ZYPREXA) disintegrating tablet 10 mg  10 mg Oral QID PRN Rulon Sera, MD   10 mg at 05/13/20 2130  . polyethylene glycol (MIRALAX / GLYCOLAX) packet 17 g  17 g Oral Daily PRN [name], John T, MD      . traZODone (DESYREL) tablet 100 mg  100 mg Oral QHS PRN Caroline Sauger, NP   100 mg at 05/13/20 2129  . ziprasidone (GEODON) injection 20 mg  20 mg Intramuscular Q4H PRN Rulon Sera, MD   20 mg at 05/15/20 1226  . zolpidem (AMBIEN) tablet 5 mg  5 mg Oral QHS Salley Scarlet, MD   5 mg at 05/14/20 2017    Lab Results:  No results found for this or any previous visit (from the past 1 hour(s)).  Blood Alcohol level:  Lab Results  Component Value Date   ETH <10 04/30/2020   ETH <10 22/29/7989    Metabolic Disorder Labs: Lab Results  Component Value Date   HGBA1C 5.4 09/30/2016   MPG 108.28 09/30/2016   MPG 114 09/17/2016   Lab Results  Component Value Date   PROLACTIN 11.8 05/01/2020   Lab Results  Component Value Date   CHOL 135 09/30/2016   TRIG 104 02/25/2018   HDL 60 09/30/2016   CHOLHDL 2.3 09/30/2016   VLDL 12 09/30/2016   LDLCALC 63 09/30/2016    Physical Findings: AIMS: Facial and Oral Movements Muscles of  Facial Expression: None, normal Lips and Perioral Area: None, normal Jaw: None, normal Tongue: Moderate,Extremity Movements Upper (arms, wrists, hands, fingers): None, normal Lower (legs, knees, ankles, toes): None, normal, Trunk Movements Neck, shoulders, hips: None, normal, Overall Severity Severity of abnormal movements (highest score from questions above): None, normal Incapacitation due to abnormal movements: None, normal Patient's awareness of abnormal movements (rate only patient's report): No Awareness, Dental Status Current problems with teeth and/or dentures?: No Does patient usually wear dentures?: No  CIWA:    COWS:     Musculoskeletal: Strength & Muscle Tone: within normal limits Gait & Station: unsteady Patient leans: N/A  Psychiatric Specialty Exam:  Presentation  General Appearance: Casual  Eye Contact:Fair  Speech:Garbled  Speech Volume:Increased  Handedness:Right   Mood and Affect  Mood: Irritable Affect:Constricted   Thought Process  Thought Processes:Goal Directed  Descriptions of Associations:Loose  Orientation:-- (Oriented to person and place)  Thought Content:Perseveration  History of Schizophrenia/Schizoaffective disorder:Yes Duration of Psychotic Symptoms:Greater than 6 months Hallucinations:Denies   Ideas of Reference:None  Suicidal Thoughts: Denies Homicidal Thoughts:Denies  Sensorium  Memory:Immediate Poor; Recent Poor; Remote Poor  Judgment:Impaired  Insight:Lacking   Executive Functions  Concentration:Poor  Attention Span:Poor  Recall:Poor  Fund of Knowledge:Poor  Language:Poor   Psychomotor Activity  Psychomotor Activity:Restless  Assets  Assets:Desire for Improvement; Financial Resources/Insurance; Social Support   Sleep  Sleep:Sleep: Fair Number of Hours of Sleep: 8    Physical Exam: Physical Exam  ROS  Blood pressure 114/85, pulse 95, temperature 97.7 F (36.5 C), temperature source Oral,  resp. rate 16, height 5\' 11"  (1.803 m), weight 75.8 kg, SpO2 99 %. Body mass  index is 23.29 kg/m.   Treatment Plan Summary: Daily contact with patient to assess and evaluate symptoms and progress in treatment and Medication management   1-2)Schizoaffective disorder, bipolar type; moderate ID (IQ 48)- behavioral disturbance, agitation - Increase Clozaril 175 mg QHS, ANC 4800 on 3/23, daily CBC for ANC checks. Will titrate back to home dose of 300 mg QHS as able -Valproic acid level 38 on Depakote 500 mg TID, dose was increased to Depakote 1000 mg BID on 3/22, check level again tomorrow - Continue Haldol 10 mg BID - Ativan 1 mg BID - Ambien 5 mg QHS  3) Mild asthma without complication- established problem, controlled - Albutrol PRN  4) Slow transit constipation- established problem, controlled  - Colace 100 mg BID, milk of magnesia PRN  5) Orthostatic hypotension- established problem, controlled - Midodrine 10 mg TID before meals  6) Tobacco Use disorder - Nicoderm 21 mg patch   7) Unspecified dysphagia- patient chocked on food on 3/20 - Continue dysphagia 3 diet with all level liquids. Still no coughing or signs of aspiration at this time.   05/15/20: Psychiatric exam above reviewed and remains accurate. Assessment and plan above reviewed and updated.    Salley Scarlet, MD 05/15/2020, 2:11 PM

## 2020-05-15 NOTE — Progress Notes (Signed)
Pt became aggressive and agitated at around 12:20pm, throwing trash can around multiple times, yelling and and threatening to harm staff, posturing, and disrobing. Pt given PRN IM Geodon 20mg  for agitation, with security present. Pt was cooperative with IM medication administration.

## 2020-05-15 NOTE — [facility] Counselor (Signed)
CSW faxed additional referral information to [facility] received confirmation that fax was successful.  Nurse and physician notes sent from 3/23-25/2022.  Assunta Curtis, MSW, LCSW 05/15/2020 2:37 PM

## 2020-05-15 NOTE — Progress Notes (Signed)
Patient on 1:1, his thoughts are disorganized, speech is tangential, he was noted aggressive banging the locked door window and threatening the staff, he was medicated PRN for agitation as needed, 15 minutes safety checks maintained will continue to monitor.

## 2020-05-15 NOTE — [facility] Group Notes (Signed)
LCSW Group Therapy Note  05/15/2020 12:32 PM  Type of Therapy/Topic:  Group Therapy:  Emotion Regulation  Participation Level:  Did Not Attend   Description of Group:   The purpose of this group is to assist patients in learning to regulate negative emotions and experience positive emotions. Patients will be guided to discuss ways in which they have been vulnerable to their negative emotions. These vulnerabilities will be juxtaposed with experiences of positive emotions or situations, and patients will be challenged to use positive emotions to combat negative ones. Special emphasis will be placed on coping with negative emotions in conflict situations, and patients will process healthy conflict resolution skills.  Therapeutic Goals: 1. Patient will identify two positive emotions or experiences to reflect on in order to balance out negative emotions 2. Patient will label two or more emotions that they find the most difficult to experience 3. Patient will demonstrate positive conflict resolution skills through discussion and/or role plays  Summary of Patient Progress:  X   Therapeutic Modalities:   Cognitive Behavioral Therapy Feelings Identification Dialectical Behavioral Therapy  Assunta Curtis, MSW, LCSW 05/15/2020 12:32 PM

## 2020-05-15 NOTE — Progress Notes (Signed)
Patient presents out of room to doors banging and yelling on them demanding breakfast. Staff attempted to re-direct, pt continued with the banging. Prn given for agitation and verbal aggression. Pt continues to get up and turn lights off and on. Continues to ring call bell.

## 2020-05-16 LAB — CBC WITH DIFFERENTIAL/PLATELET
Abs Immature Granulocytes: 0.02 10*3/uL (ref 0.00–0.07)
Basophils Absolute: 0 10*3/uL (ref 0.0–0.1)
Basophils Relative: 0 %
Eosinophils Absolute: 0.1 10*3/uL (ref 0.0–0.5)
Eosinophils Relative: 3 %
HCT: 33.3 % — ABNORMAL LOW (ref 39.0–52.0)
Hemoglobin: 10.4 g/dL — ABNORMAL LOW (ref 13.0–17.0)
Immature Granulocytes: 0 %
Lymphocytes Relative: 17 %
Lymphs Abs: 0.9 10*3/uL (ref 0.7–4.0)
MCH: 29.1 pg (ref 26.0–34.0)
MCHC: 31.2 g/dL (ref 30.0–36.0)
MCV: 93 fL (ref 80.0–100.0)
Monocytes Absolute: 0.4 10*3/uL (ref 0.1–1.0)
Monocytes Relative: 9 %
Neutro Abs: 3.5 10*3/uL (ref 1.7–7.7)
Neutrophils Relative %: 71 %
Platelets: 163 10*3/uL (ref 150–400)
RBC: 3.58 MIL/uL — ABNORMAL LOW (ref 4.22–5.81)
RDW: 14.9 % (ref 11.5–15.5)
WBC: 4.9 10*3/uL (ref 4.0–10.5)
nRBC: 0 % (ref 0.0–0.2)

## 2020-05-16 LAB — VALPROIC ACID LEVEL: Valproic Acid Lvl: 45 ug/mL — ABNORMAL LOW (ref 50.0–100.0)

## 2020-05-16 MED ORDER — CHLORPROMAZINE HCL 25 MG/ML IJ SOLN
50.0000 mg | Freq: Three times a day (TID) | INTRAMUSCULAR | Status: DC | PRN
  Administered 2020-05-17 – 2020-05-19 (×4): 50 mg via INTRAMUSCULAR
  Filled 2020-05-16 (×5): qty 2

## 2020-05-16 MED ORDER — ZIPRASIDONE MESYLATE 20 MG IM SOLR
20.0000 mg | Freq: Once | INTRAMUSCULAR | Status: AC
  Administered 2020-05-16: 20 mg via INTRAMUSCULAR

## 2020-05-16 MED ORDER — DIPHENHYDRAMINE HCL 50 MG/ML IJ SOLN
25.0000 mg | Freq: Three times a day (TID) | INTRAMUSCULAR | Status: DC | PRN
  Administered 2020-05-19: 25 mg via INTRAMUSCULAR
  Filled 2020-05-16: qty 1

## 2020-05-16 NOTE — Progress Notes (Signed)
[facility] MD Progress Note  05/16/2020 12:41 PM Austin Gates  MRN:  [id number] Subjective: Follow-up with this 60 year old man with schizoaffective disorder and intellectual disability.  Patient has continued to have such disruptive and at times aggressive behaviors that he remains partially isolated on the back hallway with a one-to-one attendant.  I was able to speak to the patient without any agitation on his part.  He was pleasant and cooperative with me.  It is very difficult to understand his speech but he was asking when he could go home.  I tried to explain as clearly and simply as I could that there was no home found yet and that was what was taking time to set up.  Patient ask if he could please not be given Thorazine shots but I told him that was dependent on his agitated aggressive behavior because the Thorazine was being used for safety when he is becoming violent.  Patient's cognition is such that it is very limited how much she can understand this sort of thing.  I noticed during the interview he has a fair bit of drooling but does not seem to be having orthostasis at least symptomatically.  He did not have any new physical complaints. 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  Essentially impossible to be managed in a normal home situation he require higher level of specialized care  Past Medical History:  Past Medical History:  Diagnosis Date  . Hypertension   . Intellectual disability   . Obsessive-compulsive disorder    . Schizo-affective schizophrenia [facility])     Past Surgical History:  Procedure Laterality Date  . COLONOSCOPY WITH PROPOFOL N/A 12/28/2017   Procedure: COLONOSCOPY WITH PROPOFOL;  Surgeon: Jonathon Bellows, MD;  Location: [facility] ENDOSCOPY;  Service: Gastroenterology;  Laterality: N/A;  . HYDROCELE EXCISION Bilateral 02/22/2018   Procedure: bilateral HYDROCELECTOMY ADULT;  Surgeon: Cleon Gustin, MD;  Location: [place];  Service: Urology;  Laterality: Bilateral;  . INSERTION OF ILIAC STENT Left 02/22/2018   Procedure: INSERTION LEFT SUPERIOR FEMORAL ARTERY USING 6MM X 5CM VIABHON STENT with mynx device closure on right femoral artery;  Surgeon: Waynetta Sandy, MD;  Location: [place];  Service: Vascular;  Laterality: Left;  . KNEE ARTHROSCOPY    . SCROTAL EXPLORATION N/A 02/22/2018   Procedure: SCROTUM EXPLORATION;  Surgeon: Cleon Gustin, MD;  Location: [place];  Service: Urology;  Laterality: N/A;  . UPPER EXTREMITY ANGIOGRAM Left 02/22/2018   Procedure: left lower EXTREMITY ANGIOGRAM;  Surgeon: Waynetta Sandy, MD;  [facility] OR;  Service: Vascular;  Laterality: Left;   Family History:  Family History  Problem Relation Age of Onset  . Diabetes Mother    Family Psychiatric  History: See previous. Social History:  Social History   Substance and Sexual Activity  Alcohol Use No     Social History   Substance and Sexual Activity  Drug Use No    Social History   Socioeconomic History  . Marital status: Single    Spouse name: Not on file  . Number of children: Not on file  .  Years of education: Not on file  . Highest education level: Not on file  Occupational History  . Occupation: Disabled  Tobacco Use  . Smoking status: Former Smoker    Types: Cigarettes  . Smokeless tobacco: Never Used  Vaping Use  . Vaping Use: Unknown  Substance and Sexual Activity  . Alcohol use: No  . Drug use: No  . Sexual activity: Never  Other Topics Concern  . Not on file   Social History Narrative   ** Merged History Encounter **       Social Determinants of Health   Financial Resource Strain: Not on file  Food Insecurity: Not on file  Transportation Needs: Not on file  Physical Activity: Not on file  Stress: Not on file  Social Connections: Not on file   Additional Social History:                         Sleep: Fair  Appetite:  Good  Current Medications: Current Facility-Administered Medications  Medication Dose Route Frequency Provider Last Rate Last Admin  . acetaminophen (TYLENOL) tablet 650 mg  650 mg Oral Q6H PRN Satya Bohall T, MD      . albuterol (VENTOLIN HFA) 108 (90 Base) MCG/ACT inhaler 2 puff  2 puff Inhalation Q4H PRN Martine Bleecker T, MD      . alum & mag hydroxide-simeth (MAALOX/MYLANTA) 200-200-20 MG/5ML suspension 30 mL  30 mL Oral Q4H PRN Rodneshia Greenhouse T, MD      . benztropine (COGENTIN) tablet 0.5 mg  0.5 mg Oral BID Tyler Robidoux, [name], MD   0.5 mg at 05/16/20 0817  . chlorproMAZINE (THORAZINE) injection 25 mg  25 mg Intramuscular TID PRN Salley Scarlet, MD       And  . diphenhydrAMINE (BENADRYL) injection 25 mg  25 mg Intramuscular TID PRN Salley Scarlet, MD      . clopidogrel (PLAVIX) tablet 75 mg  75 mg Oral Daily Salley Scarlet, MD   75 mg at 05/16/20 0817  . cloZAPine (CLOZARIL) tablet 175 mg  175 mg Oral Daily Salley Scarlet, MD   175 mg at 05/16/20 0816  . desmopressin (DDAVP) tablet 0.2 mg  0.2 mg Oral QHS Selina Cooley M, MD   0.2 mg at 05/15/20 2043  . haloperidol lactate (HALDOL) injection 5 mg  5 mg Intramuscular QID PRN Rulon Sera, MD   5 mg at 05/15/20 2109   And  . LORazepam (ATIVAN) injection 2 mg  2 mg Intramuscular Q4H PRN Rulon Sera, MD   2 mg at 05/15/20 2110   And  . diphenhydrAMINE (BENADRYL) injection 50 mg  50 mg Intramuscular Q4H PRN Rulon Sera, MD   50 mg at 05/15/20 2109  . docusate sodium (COLACE) capsule 100 mg  100 mg Oral BID PRN Dionta Larke T, MD      . feeding  supplement (ENSURE ENLIVE / ENSURE PLUS) liquid 237 mL  237 mL Oral BID BM Vernal Rutan T, MD   237 mL at 05/16/20 1000  . ferrous sulfate tablet 325 mg  325 mg Oral Q breakfast Destenee Guerry, [name], MD   325 mg at 05/16/20 0817  . food thickener (THICK IT) powder   Oral PRN Becket Wecker T, MD      . haloperidol (HALDOL) tablet 10 mg  10 mg Oral BID Gaurav Baldree, [name], MD   10 mg at 05/16/20 0816  . haloperidol lactate (HALDOL) injection 5 mg  5 mg Intramuscular Once Rulon Sera, MD      . LORazepam (ATIVAN) tablet 1 mg  1 mg Oral BID Salley Scarlet, MD   1 mg at 05/16/20 0816  . magnesium hydroxide (MILK OF MAGNESIA) suspension 30 mL  30 mL Oral Daily PRN Kasey Ewings T, MD      . melatonin tablet 5 mg  5 mg Oral QHS PRN Iva Posten, [name], MD   5 mg at 05/15/20 2042  . midodrine (PROAMATINE) tablet 10 mg  10 mg Oral TID AC Imanol Bihl, [name], MD   10 mg at 05/16/20 1159  . nicotine (NICODERM CQ - dosed in mg/24 hours) patch 21 mg  21 mg Transdermal Daily Amandeep Nesmith, [name], MD   21 mg at 05/16/20 0819  . OLANZapine zydis (ZYPREXA) disintegrating tablet 10 mg  10 mg Oral QID PRN Rulon Sera, MD   10 mg at 05/15/20 1940  . polyethylene glycol (MIRALAX / GLYCOLAX) packet 17 g  17 g Oral Daily PRN Calley Drenning T, MD      . traZODone (DESYREL) tablet 100 mg  100 mg Oral QHS PRN Caroline Sauger, NP   100 mg at 05/15/20 2042  . valproic acid (DEPAKENE) 250 MG/5ML solution 500 mg  500 mg Oral QID Salley Scarlet, MD   500 mg at 05/16/20 1159  . zolpidem (AMBIEN) tablet 5 mg  5 mg Oral QHS Salley Scarlet, MD   5 mg at 05/15/20 2041    Lab Results:  Results for orders placed or performed during the hospital encounter of 05/07/20 (from the past 48 hour(s))  CBC with Differential/Platelet     Status: Abnormal   Collection Time: 05/16/20  7:28 AM  Result Value Ref Range   WBC 4.9 4.0 - 10.5 K/uL   RBC 3.58 (L) 4.22 - 5.81 MIL/uL   Hemoglobin 10.4 (L) 13.0 - 17.0 g/dL   HCT 33.3 (L) 39.0 - 52.0 %   MCV  93.0 80.0 - 100.0 fL   MCH 29.1 26.0 - 34.0 pg   MCHC 31.2 30.0 - 36.0 g/dL   RDW 14.9 11.5 - 15.5 %   Platelets 163 150 - 400 K/uL   nRBC 0.0 0.0 - 0.2 %   Neutrophils Relative % 71 %   Neutro Abs 3.5 1.7 - 7.7 K/uL   Lymphocytes Relative 17 %   Lymphs Abs 0.9 0.7 - 4.0 K/uL   Monocytes Relative 9 %   Monocytes Absolute 0.4 0.1 - 1.0 K/uL   Eosinophils Relative 3 %   Eosinophils Absolute 0.1 0.0 - 0.5 K/uL   Basophils Relative 0 %   Basophils Absolute 0.0 0.0 - 0.1 K/uL   Immature Granulocytes 0 %   Abs Immature Granulocytes 0.02 0.00 - 0.07 K/uL    Comment: Performed at [facility], [address] [place], [place] [address]  Valproic acid level     Status: Abnormal   Collection Time: 05/16/20  7:28 AM  Result Value Ref Range   Valproic Acid Lvl 45 (L) 50.0 - 100.0 ug/mL    Comment: Performed at [facility], [address] [place], [place] [address]    Blood Alcohol level:  Lab Results  Component Value Date   [facility] <10 04/30/2020   ETH <10 96/75/9163    Metabolic Disorder Labs: Lab Results  Component Value Date   HGBA1C 5.4 09/30/2016   MPG 108.28 09/30/2016   MPG 114 09/17/2016   Lab Results  Component Value  Date   PROLACTIN 11.8 05/01/2020   Lab Results  Component Value Date   CHOL 135 09/30/2016   TRIG 104 02/25/2018   HDL 60 09/30/2016   CHOLHDL 2.3 09/30/2016   VLDL 12 09/30/2016   LDLCALC 63 09/30/2016    Physical Findings: AIMS: Facial and Oral Movements Muscles of Facial Expression: None, normal Lips and Perioral Area: None, normal Jaw: None, normal Tongue: Moderate,Extremity Movements Upper (arms, wrists, hands, fingers): None, normal Lower (legs, knees, ankles, toes): None, normal, Trunk Movements Neck, shoulders, hips: None, normal, Overall Severity Severity of abnormal movements (highest score from questions above): None, normal Incapacitation due to abnormal movements: None, normal Patient's awareness of abnormal  movements (rate only patient's report): No Awareness, Dental Status Current problems with teeth and/or dentures?: No Does patient usually wear dentures?: No  CIWA:    COWS:     Musculoskeletal: Strength & Muscle Tone: within normal limits Gait & Station: normal Patient leans: N/A  Psychiatric Specialty Exam:  Presentation  General Appearance: Casual  Eye Contact:Fair  Speech:Garbled  Speech Volume:Increased  Handedness:Right   Mood and Affect  Mood:Euthymic  Affect:Constricted   Thought Process  Thought Processes:Goal Directed  Descriptions of Associations:Loose  Orientation:-- (Oriented to person and place)  Thought Content:Perseveration  History of Schizophrenia/Schizoaffective disorder:No data recorded Duration of Psychotic Symptoms:No data recorded Hallucinations:No data recorded Ideas of Reference:None  Suicidal Thoughts:No data recorded Homicidal Thoughts:No data recorded  Sensorium  Memory:Immediate Poor; Recent Poor; Remote Poor  Judgment:Impaired  Insight:Lacking   Executive Functions  Concentration:Poor  Attention Span:Poor  Recall:Poor  Fund of Knowledge:Poor  Language:Poor   Psychomotor Activity  Psychomotor Activity:No data recorded  Assets  Assets:Desire for Improvement; Financial Resources/Insurance; Social Support   Sleep  Sleep:No data recorded   Physical Exam: Physical Exam Vitals and nursing note reviewed.  Constitutional:      Appearance: Normal appearance.  HENT:     Head: Normocephalic and atraumatic.     Mouth/Throat:     Pharynx: Oropharynx is clear.  Eyes:     Pupils: Pupils are equal, round, and reactive to light.  Cardiovascular:     Rate and Rhythm: Normal rate and regular rhythm.  Pulmonary:     Effort: Pulmonary effort is normal.     Breath sounds: Normal breath sounds.  Abdominal:     General: Abdomen is flat.     Palpations: Abdomen is soft.  Musculoskeletal:        General: Normal range  of motion.  Skin:    General: Skin is warm and dry.  Neurological:     General: No focal deficit present.     Mental Status: He is alert. Mental status is at baseline.  Psychiatric:        Attention and Perception: He is inattentive.        Mood and Affect: Mood is anxious. Affect is labile.        Speech: Speech is slurred and tangential.        Behavior: Behavior is agitated. Behavior is not aggressive or hyperactive.        Thought Content: Thought content is delusional. Thought content does not include homicidal or suicidal ideation.        Cognition and Memory: Cognition is impaired. Memory is impaired.        Judgment: Judgment is inappropriate.    Review of Systems  Constitutional: Negative.   HENT: Negative.   Eyes: Negative.   Respiratory: Negative.   Cardiovascular: Negative.   Gastrointestinal:  Negative.   Musculoskeletal: Negative.   Skin: Negative.   Neurological: Negative.   Psychiatric/Behavioral: Positive for memory loss. The patient is nervous/anxious.    Blood pressure 125/87, pulse 95, temperature 97.7 F (36.5 C), temperature source Oral, resp. rate 16, height 5\' 11"  (1.803 m), weight 75.8 kg, SpO2 99 %. Body mass index is 23.29 kg/m.   Treatment Plan Summary: Daily contact with patient to assess and evaluate symptoms and progress in treatment, Medication management and Plan 60 year old man with schizoaffective disorder intellectual disability severe behavior problems.  Reviewed the situation with nursing who showed me some of the trim that he had managed to tear off the wall pulling nails out with it.  Explained to me that as recently as the last day he had been flipping tables over.  It is not clear what if anything sets this sort of thing off I think he just probably gets frustrated and bored at times.  I am told that they finally found that Thorazine worked better than other medicines they had tried as far as calming down his agitation.  Plan continues to be  I believe the increase clozapine as tolerated but this is being done very carefully because of his recent drop in his neutrophil count.  The neutrophil count currently is remaining stable and he is up to 175 mg tonight.  No other change to medicines ordered at this time.  Tried to do some supportive encouragement with him.  Did not seem to make much difference.  Within 10 minutes of my leaving his presence I saw that he had stripped naked and was becoming agitated again.  Alethia Berthold, MD 05/16/2020, 12:41 PM

## 2020-05-16 NOTE — Progress Notes (Signed)
[facility] Notes: (Clinical Social Work)   05/16/2020      Type of Therapy:  Group Therapy   Participation Level:  Did Not Attend - was invited individually by Nurse/MHT and chose not to attend.  Unable to attend due to behaviors.   Raina Mina, [place] 05/16/2020  3:34 PM

## 2020-05-16 NOTE — Progress Notes (Signed)
1:1 observation 1900-2300 Patient banging on the door and yelling. Yelling threats at staff. Given hs medications and prn medication for agitation per prn order. Patient was compliant with medications 2300-0200 Patient resting in bed with eyes closed 0200-0600 Patient resting in bed with eyes closed.

## 2020-05-16 NOTE — Plan of Care (Signed)
  Problem: Education: Goal: Knowledge of [facility] Education information/materials will improve Outcome: Not Progressing Goal: Emotional status will improve Outcome: Not Progressing Goal: Mental status will improve Outcome: Not Progressing Goal: Verbalization of understanding the information provided will improve Outcome: Not Progressing   Problem: Activity: Goal: Interest or engagement in activities will improve Outcome: Not Progressing Goal: Sleeping patterns will improve Outcome: Not Progressing   Problem: Coping: Goal: Ability to verbalize frustrations and anger appropriately will improve Outcome: Not Progressing Goal: Ability to demonstrate self-control will improve Outcome: Not Progressing   Problem: Health Behavior/Discharge Planning: Goal: Identification of resources available to assist in meeting health care needs will improve Outcome: Not Progressing Goal: Compliance with treatment plan for underlying cause of condition will improve Outcome: Not Progressing   Problem: Physical Regulation: Goal: Ability to maintain clinical measurements within normal limits will improve Outcome: Not Progressing   Problem: Safety: Goal: Periods of time without injury will increase Outcome: Not Progressing   Problem: Education: Goal: Ability to verbalize precipitating factors for violent behavior will improve Outcome: Not Progressing   Problem: Coping: Goal: Ability to verbalize frustrations and anger appropriately will improve Outcome: Not Progressing   Problem: Health Behavior/Discharge Planning: Goal: Ability to implement measures to prevent violent behavior in the future will improve Outcome: Not Progressing   Problem: Safety: Goal: Ability to demonstrate self-control will improve Outcome: Not Progressing Goal: Ability to redirect hostility and anger into socially appropriate behaviors will improve Outcome: Not Progressing

## 2020-05-16 NOTE — Progress Notes (Addendum)
Pt has been intermittently agitated, unable to easily redirect or de-escalate pt verbally from aggressive behavior. Pt began attempting to break the fire alarm and open the emergency exit and banging on the window violently, security and MHT redirected pt from destroying the property and pt was given PRN Thorazine 25mg  IM and PRN Benadryl 50mg  IM for agitation at 13:04 which was not effective immediately, but became effective about an 45 minutes later. Since giving the IM PRNs listed above, while waiting for medication to become effective, pt turned over tables in the dayroom, banging with his fists on the windows, and yelling racial slurs and profanity at staff, also threatening to kill them, says, "I'm going to f**k you up white bitch! I'm going to kill you!" Pt demands to be discharged. Notified MD [name] via secure chat requesting PRN med for agitation review. Pt's mood is labile. Pt has poor insight and judgement. Will continue to monitor pt per 1:1 with staff for safety and Q15 minute face checks and monitor for safety and progress. Agricultural consultant and security aware of pt's behavior and also attempted to de-escalate pt. Pt remains on a 1:1 with staff for safety. Pt at times pt requires 2 or 3:1 staff members for support to manage pt's behavior, and keep pt and staff safe. Will continue to monitor. No apparent obvious triggers for pt's outbursts, pt is very impulsive.   At approximately 1800, while MHT was reporting that pt was escalating and pt tore off the door to his room while in process of preparing to give PRN medication, no injury to pt or staff, door was placed in a locked area away from pt, security was notified and back up security also presented and with a show of numbers, charge RN present, and MHT, then pt was given PRN Geodon 20mg  IM. Pt was cooperative with the IM PRN medication administration, laid himself on the bed and allowed staff to administer the medication. Pt responds well to a show of  numbers, (several staff present), becomes more redirectable with many staff present. Will continue to monitor pt per Q15 minute face checks and continue the 1:1 with staff for safety with MHT present. Unable to obtain EKG at this time, and MD was notified of all of the above.

## 2020-05-17 LAB — CBC WITH DIFFERENTIAL/PLATELET
Abs Immature Granulocytes: 0.03 10*3/uL (ref 0.00–0.07)
Basophils Absolute: 0 10*3/uL (ref 0.0–0.1)
Basophils Relative: 1 %
Eosinophils Absolute: 0.1 10*3/uL (ref 0.0–0.5)
Eosinophils Relative: 3 %
HCT: 33.8 % — ABNORMAL LOW (ref 39.0–52.0)
Hemoglobin: 10.7 g/dL — ABNORMAL LOW (ref 13.0–17.0)
Immature Granulocytes: 1 %
Lymphocytes Relative: 22 %
Lymphs Abs: 1 10*3/uL (ref 0.7–4.0)
MCH: 29 pg (ref 26.0–34.0)
MCHC: 31.7 g/dL (ref 30.0–36.0)
MCV: 91.6 fL (ref 80.0–100.0)
Monocytes Absolute: 0.4 10*3/uL (ref 0.1–1.0)
Monocytes Relative: 9 %
Neutro Abs: 3.1 10*3/uL (ref 1.7–7.7)
Neutrophils Relative %: 64 %
Platelets: 179 10*3/uL (ref 150–400)
RBC: 3.69 MIL/uL — ABNORMAL LOW (ref 4.22–5.81)
RDW: 15.1 % (ref 11.5–15.5)
WBC: 4.7 10*3/uL (ref 4.0–10.5)
nRBC: 0 % (ref 0.0–0.2)

## 2020-05-17 MED ORDER — VALPROATE SODIUM 250 MG/5ML PO SOLN
750.0000 mg | Freq: Four times a day (QID) | ORAL | Status: DC
  Administered 2020-05-17 – 2020-05-26 (×34): 750 mg via ORAL
  Filled 2020-05-17 (×46): qty 15

## 2020-05-17 NOTE — Plan of Care (Signed)
  Problem: Education: Goal: Knowledge of [facility] Education information/materials will improve Outcome: Not Progressing Goal: Emotional status will improve Outcome: Not Progressing Goal: Mental status will improve Outcome: Not Progressing Goal: Verbalization of understanding the information provided will improve Outcome: Not Progressing   Problem: Activity: Goal: Interest or engagement in activities will improve Outcome: Not Progressing Goal: Sleeping patterns will improve Outcome: Not Progressing   Problem: Coping: Goal: Ability to verbalize frustrations and anger appropriately will improve Outcome: Not Progressing Goal: Ability to demonstrate self-control will improve Outcome: Not Progressing   Problem: Health Behavior/Discharge Planning: Goal: Identification of resources available to assist in meeting health care needs will improve Outcome: Not Progressing Goal: Compliance with treatment plan for underlying cause of condition will improve Outcome: Not Progressing   Problem: Physical Regulation: Goal: Ability to maintain clinical measurements within normal limits will improve Outcome: Not Progressing   Problem: Safety: Goal: Periods of time without injury will increase Outcome: Not Progressing   Problem: Education: Goal: Ability to verbalize precipitating factors for violent behavior will improve Outcome: Not Progressing   Problem: Coping: Goal: Ability to verbalize frustrations and anger appropriately will improve Outcome: Not Progressing   Problem: Health Behavior/Discharge Planning: Goal: Ability to implement measures to prevent violent behavior in the future will improve Outcome: Not Progressing   Problem: Safety: Goal: Ability to demonstrate self-control will improve Outcome: Not Progressing Goal: Ability to redirect hostility and anger into socially appropriate behaviors will improve Outcome: Not Progressing

## 2020-05-17 NOTE — Progress Notes (Signed)
[facility] MD Progress Note  05/17/2020 12:06 PM Austin Gates  MRN:  [id number] Subjective: Follow-up with 60 year old man with schizoaffective disorder and intellectual disability.  Patient's behavior continues to wax and wane but when it is bad can be very destructive.  After I saw him yesterday later in the afternoon he became agitated for the door off of his room and required multiple injections of medication to control behavior for safety.  It was speculated that this may have been because a technician with whom he had good rapport had gone to lunch for a while.  In any case his behavior a lot of the time he is staying calm but it still very difficult to communicate with them and very difficult to see if he understands or is able to follow suggestions or directions over any period of time.  He informed staff that the voices were telling him to be aggressive earlier.  Not clear if he is really having hallucinations or if he knows that this is a thing to say when his behavior is been bad.  Yesterday I did increase the dose of his Thorazine shots from 25-50 and he has tolerated that fine.  He also has gotten Ativan and at this point has several options for as needed medicine. 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 Hypertension   . Intellectual disability   . Obsessive-compulsive disorder   . Schizo-affective schizophrenia [facility])     Past Surgical History:  Procedure  Laterality Date  . COLONOSCOPY WITH PROPOFOL N/A 12/28/2017   Procedure: COLONOSCOPY WITH PROPOFOL;  Surgeon: Jonathon Bellows, MD;  Location: [facility] ENDOSCOPY;  Service: Gastroenterology;  Laterality: N/A;  . HYDROCELE EXCISION Bilateral 02/22/2018   Procedure: bilateral HYDROCELECTOMY ADULT;  Surgeon: Cleon Gustin, MD;  Location: [place];  Service: Urology;  Laterality: Bilateral;  . INSERTION OF ILIAC STENT Left 02/22/2018   Procedure: INSERTION LEFT SUPERIOR FEMORAL ARTERY USING 6MM X 5CM VIABHON STENT with mynx device closure on right femoral artery;  Surgeon: Waynetta Sandy, MD;  Location: [place];  Service: Vascular;  Laterality: Left;  . KNEE ARTHROSCOPY    . SCROTAL EXPLORATION N/A 02/22/2018   Procedure: SCROTUM EXPLORATION;  Surgeon: Cleon Gustin, MD;  Location: [place];  Service: Urology;  Laterality: N/A;  . UPPER EXTREMITY ANGIOGRAM Left 02/22/2018   Procedure: left lower EXTREMITY ANGIOGRAM;  Surgeon: Waynetta Sandy, MD;  Location: [facility] OR;  Service: Vascular;  Laterality: Left;   Family History:  Family History  Problem Relation Age of Onset  . Diabetes Mother    Family Psychiatric  History: See previous Social History:  Social History   Substance and Sexual Activity  Alcohol Use No     Social History   Substance and Sexual Activity  Drug Use No    Social History   Socioeconomic History  . Marital status: Single    Spouse name: Not on file  . Number of children: Not on file  . Years of education: Not on file  .  Highest education level: Not on file  Occupational History  . Occupation: Disabled  Tobacco Use  . Smoking status: Former Smoker    Types: Cigarettes  . Smokeless tobacco: Never Used  Vaping Use  . Vaping Use: Unknown  Substance and Sexual Activity  . Alcohol use: No  . Drug use: No  . Sexual activity: Never  Other Topics Concern  . Not on file  Social History Narrative   ** Merged History Encounter **       Social  Determinants of Health   Financial Resource Strain: Not on file  Food Insecurity: Not on file  Transportation Needs: Not on file  Physical Activity: Not on file  Stress: Not on file  Social Connections: Not on file   Additional Social History:                         Sleep: Fair  Appetite:  Fair  Current Medications: Current Facility-Administered Medications  Medication Dose Route Frequency Provider Last Rate Last Admin  . acetaminophen (TYLENOL) tablet 650 mg  650 mg Oral Q6H PRN Marnie Fazzino T, MD      . albuterol (VENTOLIN HFA) 108 (90 Base) MCG/ACT inhaler 2 puff  2 puff Inhalation Q4H PRN Maclovio Henson T, MD      . alum & mag hydroxide-simeth (MAALOX/MYLANTA) 200-200-20 MG/5ML suspension 30 mL  30 mL Oral Q4H PRN Mimi Debellis T, MD      . benztropine (COGENTIN) tablet 0.5 mg  0.5 mg Oral BID Artelia Game T, MD   0.5 mg at 05/17/20 0803  . chlorproMAZINE (THORAZINE) injection 50 mg  50 mg Intramuscular TID PRN Fina Heizer T, MD   50 mg at 05/17/20 1117   And  . diphenhydrAMINE (BENADRYL) injection 25 mg  25 mg Intramuscular TID PRN Nycholas Rayner T, MD      . clopidogrel (PLAVIX) tablet 75 mg  75 mg Oral Daily Salley Scarlet, MD   75 mg at 05/17/20 2035  . cloZAPine (CLOZARIL) tablet 175 mg  175 mg Oral Daily Salley Scarlet, MD   175 mg at 05/17/20 0802  . desmopressin (DDAVP) tablet 0.2 mg  0.2 mg Oral QHS Selina Cooley M, MD   0.2 mg at 05/16/20 2115  . haloperidol lactate (HALDOL) injection 5 mg  5 mg Intramuscular QID PRN Rulon Sera, MD   5 mg at 05/16/20 2115   And  . LORazepam (ATIVAN) injection 2 mg  2 mg Intramuscular Q4H PRN Rulon Sera, MD   2 mg at 05/16/20 2114   And  . diphenhydrAMINE (BENADRYL) injection 50 mg  50 mg Intramuscular Q4H PRN Rulon Sera, MD   50 mg at 05/16/20 2114  . docusate sodium (COLACE) capsule 100 mg  100 mg Oral BID PRN Kamaryn Grimley, [name], MD   100 mg at 05/17/20 0803  . feeding supplement (ENSURE ENLIVE / ENSURE PLUS)  liquid 237 mL  237 mL Oral BID BM Lashaya Kienitz T, MD   237 mL at 05/16/20 1500  . ferrous sulfate tablet 325 mg  325 mg Oral Q breakfast Basem Yannuzzi, [name], MD   325 mg at 05/17/20 0803  . food thickener (THICK IT) powder   Oral PRN Katieann Hungate T, MD      . haloperidol (HALDOL) tablet 10 mg  10 mg Oral BID Duwan Adrian, [name], MD   10 mg at 05/17/20 0803  . haloperidol lactate (HALDOL) injection 5 mg  5  mg Intramuscular Once Rulon Sera, MD      . LORazepam (ATIVAN) tablet 1 mg  1 mg Oral BID Salley Scarlet, MD   1 mg at 05/17/20 2703  . magnesium hydroxide (MILK OF MAGNESIA) suspension 30 mL  30 mL Oral Daily PRN Cyle Kenyon T, MD      . melatonin tablet 5 mg  5 mg Oral QHS PRN Lucendia Leard, [name], MD   5 mg at 05/16/20 2113  . midodrine (PROAMATINE) tablet 10 mg  10 mg Oral TID AC Katie Faraone, [name], MD   10 mg at 05/17/20 1141  . nicotine (NICODERM CQ - dosed in mg/24 hours) patch 21 mg  21 mg Transdermal Daily Reyaan Thoma, [name], MD   21 mg at 05/17/20 0804  . OLANZapine zydis (ZYPREXA) disintegrating tablet 10 mg  10 mg Oral QID PRN Rulon Sera, MD   10 mg at 05/16/20 2112  . polyethylene glycol (MIRALAX / GLYCOLAX) packet 17 g  17 g Oral Daily PRN Adamariz Gillott T, MD      . traZODone (DESYREL) tablet 100 mg  100 mg Oral QHS PRN Caroline Sauger, NP   100 mg at 05/16/20 2113  . valproic acid (DEPAKENE) 250 MG/5ML solution 500 mg  500 mg Oral QID Salley Scarlet, MD   500 mg at 05/17/20 1140  . zolpidem (AMBIEN) tablet 5 mg  5 mg Oral QHS Salley Scarlet, MD   5 mg at 05/16/20 2113    Lab Results:  Results for orders placed or performed during the hospital encounter of 05/07/20 (from the past 48 hour(s))  CBC with Differential/Platelet     Status: Abnormal   Collection Time: 05/16/20  7:28 AM  Result Value Ref Range   WBC 4.9 4.0 - 10.5 K/uL   RBC 3.58 (L) 4.22 - 5.81 MIL/uL   Hemoglobin 10.4 (L) 13.0 - 17.0 g/dL   HCT 33.3 (L) 39.0 - 52.0 %   MCV 93.0 80.0 - 100.0 fL   MCH 29.1 26.0 -  34.0 pg   MCHC 31.2 30.0 - 36.0 g/dL   RDW 14.9 11.5 - 15.5 %   Platelets 163 150 - 400 K/uL   nRBC 0.0 0.0 - 0.2 %   Neutrophils Relative % 71 %   Neutro Abs 3.5 1.7 - 7.7 K/uL   Lymphocytes Relative 17 %   Lymphs Abs 0.9 0.7 - 4.0 K/uL   Monocytes Relative 9 %   Monocytes Absolute 0.4 0.1 - 1.0 K/uL   Eosinophils Relative 3 %   Eosinophils Absolute 0.1 0.0 - 0.5 K/uL   Basophils Relative 0 %   Basophils Absolute 0.0 0.0 - 0.1 K/uL   Immature Granulocytes 0 %   Abs Immature Granulocytes 0.02 0.00 - 0.07 K/uL    Comment: Performed at [facility], [address] [place], [place] [address]  Valproic acid level     Status: Abnormal   Collection Time: 05/16/20  7:28 AM  Result Value Ref Range   Valproic Acid Lvl 45 (L) 50.0 - 100.0 ug/mL    Comment: Performed at [facility], [address] [place], [place] [address]  CBC with Differential/Platelet     Status: Abnormal   Collection Time: 05/17/20  6:13 AM  Result Value Ref Range   WBC 4.7 4.0 - 10.5 K/uL   RBC 3.69 (L) 4.22 - 5.81 MIL/uL   Hemoglobin 10.7 (L) 13.0 - 17.0 g/dL   HCT 33.8 (L) 39.0 - 52.0 %  MCV 91.6 80.0 - 100.0 fL   MCH 29.0 26.0 - 34.0 pg   MCHC 31.7 30.0 - 36.0 g/dL   RDW 15.1 11.5 - 15.5 %   Platelets 179 150 - 400 K/uL   nRBC 0.0 0.0 - 0.2 %   Neutrophils Relative % 64 %   Neutro Abs 3.1 1.7 - 7.7 K/uL   Lymphocytes Relative 22 %   Lymphs Abs 1.0 0.7 - 4.0 K/uL   Monocytes Relative 9 %   Monocytes Absolute 0.4 0.1 - 1.0 K/uL   Eosinophils Relative 3 %   Eosinophils Absolute 0.1 0.0 - 0.5 K/uL   Basophils Relative 1 %   Basophils Absolute 0.0 0.0 - 0.1 K/uL   Immature Granulocytes 1 %   Abs Immature Granulocytes 0.03 0.00 - 0.07 K/uL    Comment: Performed at [facility] [address], [address] [place], [place] [address]    Blood Alcohol level:  Lab Results  Component Value Date   [facility] <10 04/30/2020   ETH <10 96/28/3662    Metabolic Disorder Labs: Lab Results   Component Value Date   HGBA1C 5.4 09/30/2016   MPG 108.28 09/30/2016   MPG 114 09/17/2016   Lab Results  Component Value Date   PROLACTIN 11.8 05/01/2020   Lab Results  Component Value Date   CHOL 135 09/30/2016   TRIG 104 02/25/2018   HDL 60 09/30/2016   CHOLHDL 2.3 09/30/2016   VLDL 12 09/30/2016   LDLCALC 63 09/30/2016    Physical Findings: AIMS: Facial and Oral Movements Muscles of Facial Expression: None, normal Lips and Perioral Area: None, normal Jaw: None, normal Tongue: Moderate,Extremity Movements Upper (arms, wrists, hands, fingers): None, normal Lower (legs, knees, ankles, toes): None, normal, Trunk Movements Neck, shoulders, hips: None, normal, Overall Severity Severity of abnormal movements (highest score from questions above): None, normal Incapacitation due to abnormal movements: None, normal Patient's awareness of abnormal movements (rate only patient's report): No Awareness, Dental Status Current problems with teeth and/or dentures?: No Does patient usually wear dentures?: No  CIWA:    COWS:     Musculoskeletal: Strength & Muscle Tone: within normal limits Gait & Station: normal Patient leans: N/A  Psychiatric Specialty Exam:  Presentation  General Appearance: Casual  Eye Contact:Fair  Speech:Garbled  Speech Volume:Increased  Handedness:Right   Mood and Affect  Mood:Euthymic  Affect:Constricted   Thought Process  Thought Processes:Goal Directed  Descriptions of Associations:Loose  Orientation:-- (Oriented to person and place)  Thought Content:Perseveration  History of Schizophrenia/Schizoaffective disorder:No data recorded Duration of Psychotic Symptoms:No data recorded Hallucinations:No data recorded Ideas of Reference:None  Suicidal Thoughts:No data recorded Homicidal Thoughts:No data recorded  Sensorium  Memory:Immediate Poor; Recent Poor; Remote Poor  Judgment:Impaired  Insight:Lacking   Executive Functions   Concentration:Poor  Attention Span:Poor  Recall:Poor  Fund of Knowledge:Poor  Language:Poor   Psychomotor Activity  Psychomotor Activity:No data recorded  Assets  Assets:Desire for Improvement; Financial Resources/Insurance; Social Support   Sleep  Sleep:No data recorded   Physical Exam: Physical Exam Vitals and nursing note reviewed.  Constitutional:      Appearance: Normal appearance.  HENT:     Head: Normocephalic and atraumatic.     Mouth/Throat:     Pharynx: Oropharynx is clear.  Eyes:     Pupils: Pupils are equal, round, and reactive to light.  Cardiovascular:     Rate and Rhythm: Normal rate and regular rhythm.  Pulmonary:     Effort: Pulmonary effort is normal.  Breath sounds: Normal breath sounds.  Abdominal:     General: Abdomen is flat.     Palpations: Abdomen is soft.  Musculoskeletal:        General: Normal range of motion.  Skin:    General: Skin is warm and dry.  Neurological:     General: No focal deficit present.     Mental Status: He is alert. Mental status is at baseline.  Psychiatric:        Attention and Perception: He is inattentive.        Mood and Affect: Mood normal. Affect is labile.        Speech: Speech is slurred and tangential.        Behavior: Behavior is agitated. Behavior is not aggressive.        Thought Content: Thought content normal.        Cognition and Memory: Cognition is impaired. Memory is impaired.        Judgment: Judgment is inappropriate.    Review of Systems  Constitutional: Negative.   HENT: Negative.   Eyes: Negative.   Respiratory: Negative.   Cardiovascular: Negative.   Gastrointestinal: Negative.   Musculoskeletal: Negative.   Skin: Negative.   Neurological: Negative.   Psychiatric/Behavioral: Positive for hallucinations.   Blood pressure 125/87, pulse 95, temperature 97.7 F (36.5 C), temperature source Oral, resp. rate 16, height 5\' 11"  (1.803 m), weight 75.8 kg, SpO2 99 %. Body mass  index is 23.29 kg/m.   Treatment Plan Summary: Medication management and Plan EKG was ordered yesterday but could not be obtained presumably because of his agitation.  Today if there is a spell in which he is calm enough to cooperate and a staff with whom he will cooperate it would be good to get the EKG so I have changed the order to today.  I see now that his Depakote level that came back from the 26th was 45 which is on the low side.  I am going to increase the dose of his Depakote which he appears to tolerate well.  He is getting daily CBCs and his absolute neutrophil count today is 3.1.  It is certainly within the normal range but it is also a little lower than yesterday which is not necessarily predictive of a problem but is concerning given his low ANC a week or 2 ago.  I am not going to change his clozapine dose for today.  Agree with staff plan of keeping the patient isolated at this point.  If we had enough staff and a more representative group of patients it would be good if he could come out of his room because he does benefit I think from being around other people and that tends to help him stay calm as long as no one is specifically agitating him.  For now however he remains on the back hallway.  Alethia Berthold, MD 05/17/2020, 12:06 PM

## 2020-05-17 NOTE — Progress Notes (Signed)
1:1 observation note 1900-2300 Patient intermittently yelling and banging on door. Asking to go home. Agitated. Labile. Threatening. Speech is incoherent. Patient given hs medications with prn's for agitation with security standing by for support. Patient was medication compliant. Continued to yell and bang on the door for some time after receiving medication 2300-0300 Resting in bed with eyes closed 0400-0700 Resting in room at this time

## 2020-05-17 NOTE — BHH Group Notes (Signed)
[place] Group Notes: (Clinical Social Work)   05/17/2020      Type of Therapy:  Group Therapy   Participation Level:  Did Not Attend - was invited individually by Nurse/MHT and chose not to attend.  Unable to attend due to behaviors.    Raina Mina, LCSWA 05/17/2020  1:32 PM

## 2020-05-17 NOTE — Plan of Care (Signed)
No aggressive behaviors noted after the IM Thorazine. Patient states " I want to get out from here." Patient denies SI,HI and AVH at this time. Compliant with medications. No signs of aspiration noted. Appetite and energy level good. Support and encouragement given.Continue monitoring with 1:1 sitter.

## 2020-05-17 NOTE — Progress Notes (Signed)
Patient verbally aggressive intermittently and started physical aggression like banging the door and hitting on the wall. Patient accepted IM PRN medication. Patient states " I won't fight with you." Patient verbalized that he is going to behave. Continue monitoring with 1: 1 sitter.

## 2020-05-18 LAB — CBC WITH DIFFERENTIAL/PLATELET
Abs Immature Granulocytes: 0.03 10*3/uL (ref 0.00–0.07)
Basophils Absolute: 0 10*3/uL (ref 0.0–0.1)
Basophils Relative: 1 %
Eosinophils Absolute: 0.1 10*3/uL (ref 0.0–0.5)
Eosinophils Relative: 2 %
HCT: 31.7 % — ABNORMAL LOW (ref 39.0–52.0)
Hemoglobin: 10.1 g/dL — ABNORMAL LOW (ref 13.0–17.0)
Immature Granulocytes: 1 %
Lymphocytes Relative: 22 %
Lymphs Abs: 1 10*3/uL (ref 0.7–4.0)
MCH: 29.1 pg (ref 26.0–34.0)
MCHC: 31.9 g/dL (ref 30.0–36.0)
MCV: 91.4 fL (ref 80.0–100.0)
Monocytes Absolute: 0.4 10*3/uL (ref 0.1–1.0)
Monocytes Relative: 9 %
Neutro Abs: 2.9 10*3/uL (ref 1.7–7.7)
Neutrophils Relative %: 65 %
Platelets: 170 10*3/uL (ref 150–400)
RBC: 3.47 MIL/uL — ABNORMAL LOW (ref 4.22–5.81)
RDW: 14.9 % (ref 11.5–15.5)
WBC: 4.4 10*3/uL (ref 4.0–10.5)
nRBC: 0 % (ref 0.0–0.2)

## 2020-05-18 MED ORDER — HYDROCERIN EX CREA
TOPICAL_CREAM | Freq: Two times a day (BID) | CUTANEOUS | Status: DC
  Administered 2020-05-21 – 2020-06-13 (×14): 1 via TOPICAL
  Filled 2020-05-18 (×3): qty 113

## 2020-05-18 MED ORDER — BACITRACIN-NEOMYCIN-POLYMYXIN 400-5-5000 EX OINT
TOPICAL_OINTMENT | Freq: Two times a day (BID) | CUTANEOUS | Status: DC
  Administered 2020-05-21 – 2020-06-06 (×11): 1 via TOPICAL
  Filled 2020-05-18 (×6): qty 1
  Filled 2020-05-18 (×2): qty 14.17
  Filled 2020-05-18: qty 1

## 2020-05-18 NOTE — BHH Counselor (Signed)
CSW attempted to contact Zipporah Plants, Care Coordinator, [phone].  CSW left HIPAA compliant voicemail.  Assunta Curtis, MSW, LCSW 05/18/2020 11:39 AM

## 2020-05-18 NOTE — Progress Notes (Signed)
1900-2300 1:1 observation Patient has been yelling loudly at intervals. Agitated, but not destroying property this shift. Patient complied with hs medications and prn medication for agitation. 2300-0300 Patient has been resting in his room. 0300-0700 Patient has been resting in his room

## 2020-05-18 NOTE — BHH Counselor (Signed)
CSW contacted the following places in attempt to procure placement:   [facility] 1. [place]: [address] [place], [place], [place], [address] [phone] Fax: [phone] [id number]   2. Developmental Disabilities [facility] [place] [facility]: [address] [place], [place], [place], [address] [phone] Fax: [phone] [phone]  -Left VM  3.[place]: [address] [place], [place], [place], [address] [phone] Fax: [phone] [id number] -Left VM  4.[place] SITE: [address], [place], [place], [address] [phone]  -Left VM  5.[facility] SITE: [address] [place], [place], [address] [phone] Fax: [phone] -Left VM  6.[facility] SITE: [address] [place], [place], [address] [phone] Fax: [phone] -Left VM   7.[facility] [place] [place] SITE: [address], [place], [place], [address] [phone] Fax: [phone] -Left VM   8.[place] SITE: [address], [place], [place], [address] [phone] Fax: [phone] -left VM  9.[place] SITE: [address] [place], [place], [address] [phone] Fax: [phone] -left VM  10.[place] development [facility] of [place] [facility] [place]: [address] [place], [place], [address] [phone] Fax: [phone]  - No beds  11.[place]: [address] [place], [place], [address] [phone] Fax: [phone] email: [email or web address] - Contacted via email  12.[place]: [address] [place], [place],  [address] [phone] Fax: [phone] -No beds available  [place]: [address] [place], [place], [address] [phone] Fax: [phone]  [place] Mayo. Marcelline Mates. [place]: [address] [place], [place], [address] [phone] Fax: [phone] [id number]  2.[place]: [address], [place], [place], [address] [phone] Fax: [phone]  3.[place] [facility] [place]: [address] [place], [place], [address] [phone] Fax: [phone] Called no VM  4. [facility] SITE: [address], [place], [place], [address] [phone] Fax: [phone] Main: [phone]  Kelsha Older [place], MSW, LCSW-A 3/28/20224:02 PM

## 2020-05-18 NOTE — Progress Notes (Signed)
Pt became agitated and aggressive unprovoked impulsively, began banging on the walls with his fists, banging on the glass, yelling, attempted by multiple staff including RNs, MHTs , and Securtiy to redirect and deescalate pt verbally, unable to therefore, PRN IM's given for agitation, Haldol 5mg  IM, Ativan 2mg  IM, and Benadryl 50mg  IM.Will continue to monitor pt on a 1:1 with staff for safety.

## 2020-05-18 NOTE — BHH Counselor (Signed)
CSW called [facility] to check on status of referral. CSW spoke with [place]. He reports that the referral remains under review by the supervisor.  He reports that the supervisor will follow up with this CSW.   Assunta Curtis, MSW, LCSW 05/18/2020 11:36 AM

## 2020-05-18 NOTE — BHH Counselor (Signed)
CSW called the following in an effort to work on bed placement:  [place] [phone]   [facility] No bed availability  [facility] [place] [phone] [facility] Asked to fax over information  [facility] [place] [phone] [place] [facility] Dis[facility] Outpatient services solely  [place] [phone] [place] [facility] Left voicemail  [place] [phone] [place] [facility] No residential portion to this program.  [place]   [phone]   [place] [facility] Left voicemail   CSW faxed FL2 and H&P to [facility].   [place]al[place] Guest. Guerry Bruin, MSW, [place], [place] 05/18/2020 4:06 PM

## 2020-05-18 NOTE — Progress Notes (Signed)
Patient getting physically more aggressive.Continueously banging on the glass door and pulled the fire alarm cover.Patient walking naked in the hallway.Not receptive with verbal de-escalation.Patient accepted IM PRN medications.

## 2020-05-18 NOTE — Tx Team (Signed)
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 acetaminophen (TYLENOL) tablet 650 mg  650 mg Oral Q6H PRN [name], John T, MD      . albuterol (VENTOLIN HFA) 108 (90 Base) MCG/ACT inhaler 2 puff  2 puff Inhalation Q4H PRN [name], John T, MD      . alum & mag hydroxide-simeth (MAALOX/MYLANTA) 200-200-20 MG/5ML suspension 30 mL  30 mL Oral Q4H PRN [name], John T, MD      . benztropine (COGENTIN) tablet 0.5 mg  0.5 mg Oral BID [name], John T, MD   0.5 mg at 05/18/20 0745  . chlorproMAZINE (THORAZINE) injection 50 mg  50 mg Intramuscular TID PRN [name], John T, MD   50 mg at 05/17/20 1117   And  . diphenhydrAMINE (BENADRYL) injection 25 mg  25 mg Intramuscular TID PRN [name], John T, MD      . clopidogrel (PLAVIX) tablet 75 mg  75 mg Oral Daily Salley Scarlet, MD   75 mg at 05/18/20 0746  . cloZAPine (CLOZARIL) tablet 175 mg  175 mg Oral Daily Salley Scarlet, MD   175 mg at 05/18/20 0745  . desmopressin (DDAVP) tablet 0.2 mg  0.2 mg Oral QHS Selina Cooley M, MD   0.2 mg at 05/17/20 2123  . haloperidol lactate (HALDOL) injection 5 mg  5 mg Intramuscular QID PRN Rulon Sera, MD   5 mg at 05/17/20 2123   And  . LORazepam (ATIVAN) injection 2 mg  2 mg Intramuscular Q4H PRN Rulon Sera, MD   2 mg at 05/17/20 2122   And  . diphenhydrAMINE (BENADRYL) injection 50 mg  50 mg Intramuscular Q4H PRN Rulon Sera, MD   50 mg at 05/17/20 2122  . docusate sodium (COLACE)  capsule 100 mg  100 mg Oral BID PRN [name], [name], MD   100 mg at 05/17/20 0803  . feeding supplement (ENSURE ENLIVE / ENSURE PLUS) liquid 237 mL  237 mL Oral BID BM [name], John T, MD   237 mL at 05/17/20 1526  . ferrous sulfate tablet 325 mg  325 mg Oral Q breakfast [name], [name], MD   325 mg at 05/18/20 0746  . food thickener (THICK IT) powder   Oral PRN [name], John T, MD      . haloperidol (HALDOL) tablet 10 mg  10 mg Oral BID [name], John T, MD   10 mg at 05/18/20 0746  . haloperidol lactate (HALDOL) injection 5 mg  5 mg Intramuscular Once Rulon Sera, MD      . LORazepam (ATIVAN) tablet 1 mg  1 mg Oral BID Salley Scarlet, MD   1 mg at 05/18/20 0745  . magnesium hydroxide (MILK OF MAGNESIA) suspension 30 mL  30 mL Oral Daily PRN [name], John T, MD      . melatonin tablet 5 mg  5 mg Oral QHS PRN [name], [name], MD   5 mg at 05/16/20 2113  . midodrine (PROAMATINE) tablet 10 mg  10 mg Oral TID AC [name], [name], MD  10 mg at 05/18/20 0745  . nicotine (NICODERM CQ - dosed in mg/24 hours) patch 21 mg  21 mg Transdermal Daily [name], [name], MD   21 mg at 05/18/20 0745  . OLANZapine zydis (ZYPREXA) disintegrating tablet 10 mg  10 mg Oral QID PRN Rulon Sera, MD   10 mg at 05/16/20 2112  . polyethylene glycol (MIRALAX / GLYCOLAX) packet 17 g  17 g Oral Daily PRN [name], John T, MD      . traZODone (DESYREL) tablet 100 mg  100 mg Oral QHS PRN Caroline Sauger, NP   100 mg at 05/17/20 2124  . valproic acid (DEPAKENE) 250 MG/5ML solution 750 mg  750 mg Oral QID [name], John T, MD   750 mg at 05/18/20 0742  . zolpidem (AMBIEN) tablet 5 mg  5 mg Oral QHS Salley Scarlet, MD   5 mg at 05/17/20 2123   PTA Medications: Medications Prior to Admission  Medication Sig Dispense Refill Last Dose  . benztropine (COGENTIN) 0.5 MG tablet Take 0.5 mg by mouth 2 (two) times daily.   05/07/2020 at Unknown time  . divalproex (DEPAKOTE) 500 MG DR tablet Take 500 mg by mouth 3 (three) times  daily.     . Ensure (ENSURE) Take 237 mLs by mouth in the morning and at bedtime.     . ferrous sulfate 325 (65 FE) MG tablet Take 325 mg by mouth daily with breakfast.     . food thickener (THICK IT) POWD Take by mouth in the morning, at noon, and at bedtime.     Marland Kitchen lactulose (CHRONULAC) 10 GM/15ML solution Take 20 g by mouth daily.     . melatonin 3 MG TABS tablet Take 3 mg by mouth at bedtime as needed (sleep).     . midodrine (PROAMATINE) 10 MG tablet Take 10 mg by mouth 3 (three) times daily.       Patient Stressors: Health problems Marital or family conflict  Patient Strengths: Supportive family/friends  Treatment Modalities: Medication Management, Group therapy, Case management,  1 to 1 session with clinician, Psychoeducation, Recreational therapy.   Physician Treatment Plan for Primary Diagnosis: Schizoaffective disorder, bipolar type ([place]) Long Term Goal(s): Improvement in symptoms so as ready for discharge Improvement in symptoms so as ready for discharge   Short Term Goals: Ability to identify changes in lifestyle to reduce recurrence of condition will improve Ability to verbalize feelings will improve Ability to disclose and discuss suicidal ideas Ability to demonstrate self-control will improve Ability to identify and develop effective coping behaviors will improve Ability to maintain clinical measurements within normal limits will improve Compliance with prescribed medications will improve Ability to identify changes in lifestyle to reduce recurrence of condition will improve Ability to verbalize feelings will improve Ability to disclose and discuss suicidal ideas Ability to demonstrate self-control will improve Ability to identify and develop effective coping behaviors will improve Ability to maintain clinical measurements within normal limits will improve Compliance with prescribed medications will improve Ability to identify triggers associated with substance  abuse/mental health issues will improve  Medication Management: Evaluate patient's response, side effects, and tolerance of medication regimen.  Therapeutic Interventions: 1 to 1 sessions, Unit Group sessions and Medication administration.  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   Hypotension   Mild asthma without complication  Slow transit constipation  Long Term Goal(s): Improvement in symptoms so as ready for discharge Improvement in symptoms so as ready for discharge   Short Term Goals: Ability to identify changes in lifestyle to reduce recurrence of condition will improve Ability to verbalize feelings will improve Ability to disclose and discuss suicidal ideas Ability to demonstrate self-control will improve Ability to identify and develop effective coping behaviors will improve Ability to maintain clinical measurements within normal limits will improve Compliance with prescribed medications will improve Ability to identify changes in lifestyle to reduce recurrence of condition will improve Ability to verbalize feelings will improve Ability to disclose and discuss suicidal ideas Ability to demonstrate self-control will improve Ability to identify and develop effective coping behaviors will improve Ability to maintain clinical measurements within normal limits will improve Compliance with prescribed medications will improve Ability to identify triggers associated with substance abuse/mental health issues will improve     Medication Management: Evaluate patient's response, side effects, and tolerance of medication regimen.  Therapeutic Interventions: 1 to 1 sessions, Unit Group sessions and Medication administration.  Evaluation of Outcomes: Not Progressing   RN Treatment  Plan for Primary Diagnosis: Schizoaffective disorder, bipolar type ([place]) Long Term Goal(s): Knowledge of disease and therapeutic regimen to maintain health will improve  Short Term Goals: Ability to remain free from injury will improve, Ability to verbalize frustration and anger appropriately will improve, Ability to demonstrate self-control, Ability to participate in decision making will improve, Ability to verbalize feelings will improve, Ability to identify and develop effective coping behaviors will improve and Compliance with prescribed medications will improve  Medication Management: RN will administer medications as ordered by provider, will assess and evaluate patient's response and provide education to patient for prescribed medication. RN will report any adverse and/or side effects to prescribing provider.  Therapeutic Interventions: 1 on 1 counseling sessions, Psychoeducation, Medication administration, Evaluate responses to treatment, Monitor vital signs and CBGs as ordered, Perform/monitor CIWA, COWS, AIMS and Fall Risk screenings as ordered, Perform wound care treatments as ordered.  Evaluation of Outcomes: Not Progressing   LCSW Treatment Plan for Primary Diagnosis: Schizoaffective disorder, bipolar type ([place]) Long Term Goal(s): Safe transition to appropriate next level of care at discharge, Engage patient in therapeutic group addressing interpersonal concerns.  Short Term Goals: Engage patient in aftercare planning with referrals and resources, Increase social support, Increase ability to appropriately verbalize feelings, Increase emotional regulation, Facilitate acceptance of mental health diagnosis and concerns, Identify triggers associated with mental health/substance abuse issues and Increase skills for wellness and recovery  Therapeutic Interventions: Assess for all discharge needs, 1 to 1 time with Social worker, Explore available resources and support systems, Assess for  adequacy in community support network, Educate family and significant other(s) on suicide prevention, Complete Psychosocial Assessment, Interpersonal group therapy.  Evaluation of Outcomes: Not Progressing   Progress in Treatment: Attending groups: No. Participating in groups: No. Taking medication as prescribed: Yes. Toleration medication: Yes. Family/Significant other contact made: Yes, individual(s) contacted:  [name], guardian/sister was contacted. Patient understands diagnosis: No. Discussing patient identified problems/goals with staff: Yes. Medical problems stabilized or resolved: Yes. Denies suicidal/homicidal ideation: Yes. Issues/concerns per patient self-inventory: No. Other: None.  New problem(s) identified: No, Describe:  none.  New Short Term/Long Term Goal(s): medication management for mood stabilization; elimination of SI thoughts; development of comprehensive mental wellness. Update 05/13/20: No changes at this time. Update 05/18/20: No changes at this time.  Patient Goals: "I want to go home." Update 05/13/20: No  changes at this time. Update 05/18/20: No changes at this time.  Discharge Plan or Barriers: CSW will assist with development of an appropriate discharge/aftercare plan. Pt will needed placement and has a guardian. Update 05/13/20: Pt behavior has become increasingly aggressive. Referral has been made to [facility] and pt is on waiting list for [facility]. Update 05/18/20: Pt remains aggressive and isolated to the back hall. Pt is on the wait list for [place].  Reason for Continuation of Hospitalization: Aggression Medical Issues Medication stabilization  Estimated Length of Stay: TBD  Attendees: Patient:  05/18/2020 8:51 AM  Physician: Selina Cooley, MD 05/18/2020 8:51 AM  Nursing:  05/18/2020 8:51 AM  RN Care Manager: 05/18/2020 8:51 AM  Social Worker: Chalmers Guest. Guerry Bruin, MSW, [place], [place] 05/18/2020 8:51 AM  Recreational Therapist: 05/18/2020 8:51 AM   Other: Assunta Curtis, MSW, LCSW 05/18/2020 8:51 AM  Other: Kiva Martinique, MSW, LCSW-A 05/18/2020 8:51 AM  Other: 05/18/2020 8:51 AM    Scribe for Treatment Team: Shirl Harris, LCSW 05/18/2020 8:51 AM

## 2020-05-18 NOTE — B[facility] Group Notes (Signed)
LCSW Group Therapy Note   05/18/2020 1:55 PM  Type of Therapy and Topic:  Group Therapy:  Overcoming Obstacles   Participation Level:  Did Not Attend   Description of Group:    In this group patients will be encouraged to explore what they see as obstacles to their own wellness and recovery. They will be guided to discuss their thoughts, feelings, and behaviors related to these obstacles. The group will process together ways to cope with barriers, with attention given to specific choices patients can make. Each patient will be challenged to identify changes they are motivated to make in order to overcome their obstacles. This group will be process-oriented, with patients participating in exploration of their own experiences as well as giving and receiving support and challenge from other group members.   Therapeutic Goals: 1. Patient will identify personal and current obstacles as they relate to admission. 2. Patient will identify barriers that currently interfere with their wellness or overcoming obstacles.  3. Patient will identify feelings, thought process and behaviors related to these barriers. 4. Patient will identify two changes they are willing to make to overcome these obstacles:      Summary of Patient Progress X    Therapeutic Modalities:   Cognitive Behavioral Therapy Solution Focused Therapy Motivational Interviewing Relapse Prevention Therapy  Austin Gates R. Guerry Bruin, MSW, LCSW, [place] 05/18/2020 1:55 PM

## 2020-05-18 NOTE — BHH Counselor (Signed)
CSW received call from [facility] with [place] recommended that CSW search within [place] for placement for patient.  She further recommends that CSW continue to work on placement for the patient.   She reports that the patient is waitlisted, however, if bed is available another exception form will need to be completed.  She reports that she can not tell this CSW where the patient is on the waitlist, just that he is on it.  Assunta Curtis, MSW, LCSW 05/18/2020 4:02 PM

## 2020-05-18 NOTE — Plan of Care (Signed)
  Problem: Education: Goal: Knowledge of [facility] Education information/materials will improve Outcome: Not Progressing Goal: Emotional status will improve Outcome: Not Progressing Goal: Mental status will improve Outcome: Not Progressing Goal: Verbalization of understanding the information provided will improve Outcome: Not Progressing   Problem: Activity: Goal: Interest or engagement in activities will improve Outcome: Not Progressing Goal: Sleeping patterns will improve Outcome: Not Progressing   Problem: Coping: Goal: Ability to verbalize frustrations and anger appropriately will improve Outcome: Not Progressing Goal: Ability to demonstrate self-control will improve Outcome: Not Progressing   Problem: Health Behavior/Discharge Planning: Goal: Identification of resources available to assist in meeting health care needs will improve Outcome: Not Progressing Goal: Compliance with treatment plan for underlying cause of condition will improve Outcome: Not Progressing   Problem: Physical Regulation: Goal: Ability to maintain clinical measurements within normal limits will improve Outcome: Not Progressing   Problem: Safety: Goal: Periods of time without injury will increase Outcome: Not Progressing   Problem: Education: Goal: Ability to verbalize precipitating factors for violent behavior will improve Outcome: Not Progressing   Problem: Coping: Goal: Ability to verbalize frustrations and anger appropriately will improve Outcome: Not Progressing   Problem: Health Behavior/Discharge Planning: Goal: Ability to implement measures to prevent violent behavior in the future will improve Outcome: Not Progressing   Problem: Safety: Goal: Ability to demonstrate self-control will improve Outcome: Not Progressing Goal: Ability to redirect hostility and anger into socially appropriate behaviors will improve Outcome: Not Progressing

## 2020-05-18 NOTE — Progress Notes (Signed)
Patient started banging the door and hitting at the wall. Not responding to verbal de escalation.PRN IM Thorazine given.Patient accepted medication with no issues. Made patient comfortable in his room.

## 2020-05-18 NOTE — BHH Counselor (Signed)
CSW called the following in an effort to work on bed placement:  Name of Programmer, systems Name [place] Name [place] Number [place]  Program Code Program Code Type Outcome  [place] [id number] [phone] [place] 27G.5600C Supervised Living for Adults with Developmental Disabilities Not a residential home  [place] [phone] [place] for Adults with Developmental Disabilities Not a residential home  [place] [phone] [place] for Adults with Mental Illness Asked to speak with Nelida Gores.  CSW left HIPAA compliant voicemail  [facility] [place] [phone] [place] 27G.5600C Supervised Living for Adults with Developmental Disabilities Have to be on state list to be admitted.  [facility] [place] [phone] [place] 27G.5600C Supervised Living for Adults with Developmental Disabilities Have to be on state list to be admitted.  [facility] [facility] [phone] [place] 27G.5600C Supervised Living for Adults with Developmental Disabilities Asked to speak with Nelida Gores.  CSW left HIPAA compliant voicemail  [place] [phone] [place] for Adults with Mental Illness Asked to speak with Nelida Gores.  CSW left HIPAA compliant voicemail  [place] [facility] [place] [phone]  [phone] [place] 27G.5600C Supervised Living for Adults with Developmental Disabilities Not a residential home.  [facility] [place] [phone] [place] 27G.5600C Supervised Living for Adults with Developmental Disabilities CSW left HIPAA compliant voicemail.  [employer] Dr. Ubaldo [place] [phone] [place] 27G.5600C Supervised Living for Adults with Developmental Disabilities Line rang without option to leave a voicemail.   [facility] [place] [facility] [phone] [place] 27G.5600C Supervised Living for Adults with [place] rang without option of leaving a voicemail.  Maxine Glenn [place] [phone] [place] 27G.5600C Supervised Living for Adults with Developmental Disabilities CSW left HIPAA compliant voicemail.  [place] [phone] [place] 27G.5600C Supervised Living for Adults with Developmental Disabilities CSW left HIPAA compliant voicemail.  [facility]is [facility]. [place] [facility] [place] [phone] [place] 27G.5600C Supervised Living for Adults with Developmental Disabilities Asked to speak with Nelida Gores.  CSW left HIPAA compliant voicemail  [place] [phone] [place] 27G.5600C Supervised Living for Adults with Developmental Disabilities Asked to speak with Nelida Gores.  CSW left HIPAA compliant voicemail  [place] [phone] [place] 27G.5600C Supervised Living for Adults with Developmental Disabilities Asked to speak with Nelida Gores.  CSW left HIPAA  compliant voicemail  [place] [phone]  27G.5600C Supervised Living for Adults with Developmental Disabilities Asked to speak with Nelida Gores.  CSW  left HIPAA compliant voicemail  [place] [phone] [place] 27G.5600C Supervised Living for Adults with Developmental Disabilities Asked to speak with Nelida Gores.  CSW left HIPAA compliant voicemail  [place] [phone] [place] 27G.5600C Supervised Living for Adults with Developmental Disabilities Asked to speak with Nelida Gores.  CSW left HIPAA compliant voicemail  [place] [phone] [place] 27G.5600C Supervised Living for Adults with Developmental Disabilities Asked to speak with Nelida Gores.  CSW left HIPAA compliant voicemail  [place] [phone] [place] for Adults with Developmental Disabilities Asked to speak with Nelida Gores.  CSW left HIPAA compliant voicemail  [place] [phone] [place] for Adults with Developmental Disabilities Asked to speak with Nelida Gores.  CSW left HIPAA compliant voicemail  [place] [phone] [place] 27G.5600C Supervised Living for Adults with Developmental Disabilities Asked to speak with Nelida Gores.  CSW left HIPAA compliant voicemail  [place] [phone] [place] 27G.5600C Supervised Living  for Adults with Developmental Disabilities Line rang without option of leaving a voicemail.   [place] [phone] [place] 27G.5600C Supervised Living for Adults with Developmental Disabilities Asked that email be sent [email or web address].  Asked for callback after 3pm.  [place] [phone] [place] for Adults with Developmental Disabilities See above note.  Lesleigh Noe Ashe Huey [place] talley [phone] [place] 27G.5600A Supervised Living for Adults with Mental Illness CSW left HIPAA compliant voicemail.  [place], [place] "Serenity" Alphia Moh [phone] [place] for Adults with [place] rang then began to make fax like noise.  Clara S. [place] #3 Tawana Scale [phone] [place] 27G.5600C Supervised Living for Adults with Developmental Disabilities CSW left HIPAA compliant voicemail.  [place] 2 Nadene Rubins [phone] [place] 27G.5600C [place] for Adults with Developmental Disabilities Line rang without the option of leaving a [place] [phone] [place] for Adults with [place] rang without the option of leaving a [place], [place] [phone] [place] for Adults with Mental Illness Number not in service.   [facility] [place] [facility] [place] [phone] [place] 27G.5600A Supervised Living for Adults with Mental Illness  CSW left HIPAA compliant voicemail.  A [place] [phone] [place] for Adults with Mental Illness Home is now closed.  [facility] [place] [phone] [place] 27G.5600C Supervised Living for Adults with Developmental Disabilities Line rang  without option of leaving voicemail.  The [place] [facility] [place] [phone] [place] 27G.5600A Supervised Living for Adults with Mental Illness No beds at this time.  Male or male.  [place] [phone] [place] for Adults with Mental Illness No male beds.  Male must be IDD.  [place] [phone] [place] 27G.5600A Supervised Living for Adults with Mental Illness Line rang then began to make fax like noise.  [place] of [place] [phone] [place] for Adults with Mental Illness Line rang then began to make fax like noise.  [facility] [place] [phone] [place] 27G.5600A Supervised Living for Adults with Mental Illness CSW left HIPAA compliant voicemail.  [place] Level III She[facility] General [phone] [place] 27G.5600A Supervised Living for Adults with Mental Illness Line rang without the option of leaving a HIPAA compliant voicemail  [place] [phone] [place] 27G.5600C Supervised Living for Adults with Developmental Disabilities Number not in service.    Ainsley Spinner and [place] [phone] [place] 27G.5600A Supervised Living for Adults with Mental Illness Asked to call back on 05/19/2020.  [place] [phone] [place] 27G.5600A Supervised Living for Adults with Mental Illness Asked to call back on 05/19/2020.  [place] [phone] [place] 27G.5600A Supervised Living for Adults with Mental Illness Number not in service.   [place] Mollissie Everlene Balls [phone] [place] for Adults with Mental Illness CSW left HIPAA compliant voicemail.  [place] X Joseph Art [phone]   [place] 27G.5600A Supervised Living for Adults with Mental Illness Not a residential service.  [facility] Just In [facility] [place] [phone] [place] 27G.5600C Supervised Living for Adults with Developmental Disabilities Children only.  [place] [phone] [place] 27G.5600A Supervised Living for Adults with Mental Illness Does not accept aggressive individuals.     Assunta Curtis, MSW, LCSW 05/18/2020 3:47 PM

## 2020-05-18 NOTE — Progress Notes (Addendum)
[facility] MD Progress Note  05/18/2020 1:23 PM Austin Gates  MRN:  [id number]   CC "Am I leaving today?"  Subjective:  60 year old male with schizoaffective disorder, bipolar type and moderate ID (IQ 19) presenting with increased agitation and aggression. Overnight patient received PRNs for acute agitation and aggression toward staff. He has been medication compliant. ADLs impaired  Patient noted to be agitated again this morning, banging on walls and no responding to verbal de escalation. He received PRNs for agitation. He later appeared calm and mood improved when lunch was delivered. He continues to request discharge. Reminded that we do not have a place for him to go. He continues to state he is not doing anything wrong despite getting PRNs less than 10 minutes prior to interview. Encouraged patient to not destroy property, threaten staff, or hit staff to ease process of placement. It is unclear if patient is able to comprehend this. He denies suicidal ideations, homicidal ideations, visual hallucinations, and auditory hallucinations. He does not appear to be responding to internal stimuli. Also alerted by staff that he has a small cut on his right foot. On inspection his skin is very dry, and there is a small shallow skin tear without drainage or signs of infection. Will order neosporin and Eucerin cream to assist. Later this afternoon patient became agitated without known provocation. He began yelling, banging on glass and walls, multiple staff members attempted to redirect and deescalate without success and additional PRNs were given.        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 Hypertension   . Intellectual disability   . Obsessive-compulsive disorder   . Schizo-affective schizophrenia [facility])     Past Surgical History:  Procedure Laterality Date  . COLONOSCOPY WITH PROPOFOL N/A 12/28/2017   Procedure: COLONOSCOPY WITH PROPOFOL;  Surgeon: Jonathon Bellows, MD;  Location[facility] ENDOSCOPY;  Service: Gastroenterology;  Laterality: N/A;  . HYDROCELE EXCISION Bilateral 02/22/2018   Procedure: bilateral HYDROCELECTOMY ADULT;  Surgeon: Cleon Gustin, MD;  Location: [place];  Service: Urology;  Laterality: Bilateral;  . INSERTION OF ILIAC STENT Left 02/22/2018   Procedure: INSERTION LEFT SUPERIOR FEMORAL ARTERY USING 6MM X 5CM VIABHON STENT with mynx device closure on right femoral artery;  Surgeon: Waynetta Sandy, MD;  Location: [place];  Service: Vascular;  Laterality: Left;  . KNEE ARTHROSCOPY    . SCROTAL EXPLORATION N/A 02/22/2018   Procedure: SCROTUM EXPLORATION;  Surgeon: Cleon Gustin, MD;  Location: [place];  Service: Urology;  Laterality: N/A;  . UPPER EXTREMITY ANGIOGRAM Left 02/22/2018   Procedure: left lower EXTREMITY ANGIOGRAM;  Surgeon: Waynetta Sandy, MD;  Location[facility] OR;  Service: Vascular;  Laterality: Left;   Family History:  Family History  Problem Relation Age of Onset  . Diabetes Mother    Family Psychiatric  History: See H&P Social History:  Social History   Substance and Sexual Activity  Alcohol Use No     Social History   Substance and Sexual Activity  Drug Use No    Social History   Socioeconomic History  . Marital status: Single  Spouse name: Not on file  . Number of children: Not on file  . Years of education: Not on file  . Highest education level: Not on file  Occupational History  . Occupation: Disabled  Tobacco Use  . Smoking status: Former Smoker    Types: Cigarettes  . Smokeless tobacco: Never Used   Vaping Use  . Vaping Use: Unknown  Substance and Sexual Activity  . Alcohol use: No  . Drug use: No  . Sexual activity: Never  Other Topics Concern  . Not on file  Social History Narrative   ** Merged History Encounter **       Social Determinants of Health   Financial Resource Strain: Not on file  Food Insecurity: Not on file  Transportation Needs: Not on file  Physical Activity: Not on file  Stress: Not on file  Social Connections: Not on file   Additional Social History:     Sleep: Fair  Appetite:  Good  Current Medications: Current Facility-Administered Medications  Medication Dose Route Frequency Provider Last Rate Last Admin  . acetaminophen (TYLENOL) tablet 650 mg  650 mg Oral Q6H PRN [name], John T, MD      . albuterol (VENTOLIN HFA) 108 (90 Base) MCG/ACT inhaler 2 puff  2 puff Inhalation Q4H PRN [name], John T, MD      . alum & mag hydroxide-simeth (MAALOX/MYLANTA) 200-200-20 MG/5ML suspension 30 mL  30 mL Oral Q4H PRN [name], John T, MD      . benztropine (COGENTIN) tablet 0.5 mg  0.5 mg Oral BID [name], John T, MD   0.5 mg at 05/18/20 0745  . chlorproMAZINE (THORAZINE) injection 50 mg  50 mg Intramuscular TID PRN [name], John T, MD   50 mg at 05/18/20 1058   And  . diphenhydrAMINE (BENADRYL) injection 25 mg  25 mg Intramuscular TID PRN [name], John T, MD      . clopidogrel (PLAVIX) tablet 75 mg  75 mg Oral Daily Salley Scarlet, MD   75 mg at 05/18/20 0746  . cloZAPine (CLOZARIL) tablet 175 mg  175 mg Oral Daily Salley Scarlet, MD   175 mg at 05/18/20 0745  . desmopressin (DDAVP) tablet 0.2 mg  0.2 mg Oral QHS Selina Cooley M, MD   0.2 mg at 05/17/20 2123  . haloperidol lactate (HALDOL) injection 5 mg  5 mg Intramuscular QID PRN Rulon Sera, MD   5 mg at 05/18/20 1229   And  . LORazepam (ATIVAN) injection 2 mg  2 mg Intramuscular Q4H PRN Rulon Sera, MD   2 mg at 05/18/20 1228   And  . diphenhydrAMINE (BENADRYL) injection 50 mg  50 mg  Intramuscular Q4H PRN Rulon Sera, MD   50 mg at 05/18/20 1229  . docusate sodium (COLACE) capsule 100 mg  100 mg Oral BID PRN [name], [name], MD   100 mg at 05/17/20 0803  . feeding supplement (ENSURE ENLIVE / ENSURE PLUS) liquid 237 mL  237 mL Oral BID BM [name], John T, MD   237 mL at 05/18/20 1029  . ferrous sulfate tablet 325 mg  325 mg Oral Q breakfast [name], [name], MD   325 mg at 05/18/20 0746  . food thickener (THICK IT) powder   Oral PRN [name], John T, MD      . haloperidol (HALDOL) tablet 10 mg  10 mg Oral BID [name], [name], MD   10 mg at 05/18/20 0746  . haloperidol lactate (HALDOL) injection 5  mg  5 mg Intramuscular Once Rulon Sera, MD      . hydrocerin (EUCERIN) cream   Topical BID Salley Scarlet, MD      . LORazepam (ATIVAN) tablet 1 mg  1 mg Oral BID Salley Scarlet, MD   1 mg at 05/18/20 0745  . magnesium hydroxide (MILK OF MAGNESIA) suspension 30 mL  30 mL Oral Daily PRN [name], John T, MD      . melatonin tablet 5 mg  5 mg Oral QHS PRN [name], [name], MD   5 mg at 05/16/20 2113  . midodrine (PROAMATINE) tablet 10 mg  10 mg Oral TID AC [name], [name], MD   10 mg at 05/18/20 1130  . neomycin-bacitracin-polymyxin (NEOSPORIN) ointment   Topical BID Salley Scarlet, MD      . nicotine (NICODERM CQ - dosed in mg/24 hours) patch 21 mg  21 mg Transdermal Daily [name], [name], MD   21 mg at 05/18/20 0745  . OLANZapine zydis (ZYPREXA) disintegrating tablet 10 mg  10 mg Oral QID PRN Rulon Sera, MD   10 mg at 05/16/20 2112  . polyethylene glycol (MIRALAX / GLYCOLAX) packet 17 g  17 g Oral Daily PRN [name], John T, MD      . traZODone (DESYREL) tablet 100 mg  100 mg Oral QHS PRN Caroline Sauger, NP   100 mg at 05/17/20 2124  . valproic acid (DEPAKENE) 250 MG/5ML solution 750 mg  750 mg Oral QID [name], John T, MD   750 mg at 05/18/20 1129  . zolpidem (AMBIEN) tablet 5 mg  5 mg Oral QHS Salley Scarlet, MD   5 mg at 05/17/20 2123    Lab Results:  Results for  orders placed or performed during the hospital encounter of 05/07/20 (from the past 48 hour(s))  CBC with Differential/Platelet     Status: Abnormal   Collection Time: 05/17/20  6:13 AM  Result Value Ref Range   WBC 4.7 4.0 - 10.5 K/uL   RBC 3.69 (L) 4.22 - 5.81 MIL/uL   Hemoglobin 10.7 (L) 13.0 - 17.0 g/dL   HCT 33.8 (L) 39.0 - 52.0 %   MCV 91.6 80.0 - 100.0 fL   MCH 29.0 26.0 - 34.0 pg   MCHC 31.7 30.0 - 36.0 g/dL   RDW 15.1 11.5 - 15.5 %   Platelets 179 150 - 400 K/uL   nRBC 0.0 0.0 - 0.2 %   Neutrophils Relative % 64 %   Neutro Abs 3.1 1.7 - 7.7 K/uL   Lymphocytes Relative 22 %   Lymphs Abs 1.0 0.7 - 4.0 K/uL   Monocytes Relative 9 %   Monocytes Absolute 0.4 0.1 - 1.0 K/uL   Eosinophils Relative 3 %   Eosinophils Absolute 0.1 0.0 - 0.5 K/uL   Basophils Relative 1 %   Basophils Absolute 0.0 0.0 - 0.1 K/uL   Immature Granulocytes 1 %   Abs Immature Granulocytes 0.03 0.00 - 0.07 K/uL    Comment: Performed at [facility], [address] [place], [place] [address]  CBC with Differential/Platelet     Status: Abnormal   Collection Time: 05/18/20  6:24 AM  Result Value Ref Range   WBC 4.4 4.0 - 10.5 K/uL   RBC 3.47 (L) 4.22 - 5.81 MIL/uL   Hemoglobin 10.1 (L) 13.0 - 17.0 g/dL   HCT 31.7 (L) 39.0 - 52.0 %   MCV 91.4 80.0 - 100.0 fL   MCH 29.1 26.0 -  34.0 pg   MCHC 31.9 30.0 - 36.0 g/dL   RDW 14.9 11.5 - 15.5 %   Platelets 170 150 - 400 K/uL   nRBC 0.0 0.0 - 0.2 %   Neutrophils Relative % 65 %   Neutro Abs 2.9 1.7 - 7.7 K/uL   Lymphocytes Relative 22 %   Lymphs Abs 1.0 0.7 - 4.0 K/uL   Monocytes Relative 9 %   Monocytes Absolute 0.4 0.1 - 1.0 K/uL   Eosinophils Relative 2 %   Eosinophils Absolute 0.1 0.0 - 0.5 K/uL   Basophils Relative 1 %   Basophils Absolute 0.0 0.0 - 0.1 K/uL   Immature Granulocytes 1 %   Abs Immature Granulocytes 0.03 0.00 - 0.07 K/uL    Comment: Performed at [facility], [address] [place], [place] [address]    Blood  Alcohol level:  Lab Results  Component Value Date   [facility] <10 04/30/2020   ETH <10 31/49/7026    Metabolic Disorder Labs: Lab Results  Component Value Date   HGBA1C 5.4 09/30/2016   MPG 108.28 09/30/2016   MPG 114 09/17/2016   Lab Results  Component Value Date   PROLACTIN 11.8 05/01/2020   Lab Results  Component Value Date   CHOL 135 09/30/2016   TRIG 104 02/25/2018   HDL 60 09/30/2016   CHOLHDL 2.3 09/30/2016   VLDL 12 09/30/2016   LDLCALC 63 09/30/2016    Physical Findings: AIMS: Facial and Oral Movements Muscles of Facial Expression: None, normal Lips and Perioral Area: None, normal Jaw: None, normal Tongue: Moderate,Extremity Movements Upper (arms, wrists, hands, fingers): None, normal Lower (legs, knees, ankles, toes): None, normal, Trunk Movements Neck, shoulders, hips: None, normal, Overall Severity Severity of abnormal movements (highest score from questions above): None, normal Incapacitation due to abnormal movements: None, normal Patient's awareness of abnormal movements (rate only patient's report): No Awareness, Dental Status Current problems with teeth and/or dentures?: No Does patient usually wear dentures?: No  CIWA:    COWS:     Musculoskeletal: Strength & Muscle Tone: within normal limits Gait & Station: unsteady Patient leans: N/A  Psychiatric Specialty Exam:  Presentation  General Appearance: Casual  Eye Contact:Fair  Speech:Garbled  Speech Volume:Increased  Handedness:Right   Mood and Affect  Mood: Irritable Affect:Constricted   Thought Process  Thought Processes:Goal Directed  Descriptions of Associations:Loose  Orientation:-- (Oriented to person and place)  Thought Content:Perseveration  History of Schizophrenia/Schizoaffective disorder:Yes Duration of Psychotic Symptoms:Greater than 6 months Hallucinations:Denies   Ideas of Reference:None  Suicidal Thoughts: Denies Homicidal Thoughts:Denies  Sensorium   Memory:Immediate Poor; Recent Poor; Remote Poor  Judgment:Impaired  Insight:Lacking   Executive Functions  Concentration:Poor  Attention Span:Poor  Recall:Poor  Fund of Knowledge:Poor  Language:Poor   Psychomotor Activity  Psychomotor Activity:Restless  Assets  Assets:Desire for Improvement; Financial Resources/Insurance; Social Support   Sleep  Sleep:Sleep: Fair Number of Hours of Sleep: 8    Physical Exam: Physical Exam  ROS  Blood pressure 113/84, pulse 88, temperature 98.1 F (36.7 C), temperature source Oral, resp. rate 14, height 5\' 11"  (1.803 m), weight 75.8 kg, SpO2 99 %. Body mass index is 23.29 kg/m.   Treatment Plan Summary: Daily contact with patient to assess and evaluate symptoms and progress in treatment and Medication management   1-2)Schizoaffective disorder, bipolar type; moderate ID (IQ 48)- behavioral disturbance, agitation - Continue Clozaril 175 mg QHS, ANC 2904 today, daily CBC for ANC checks. Will titrate back to home dose of 300 mg QHS  as able. No increase today as ANC continues to decline each day although still within normal limits  -Valproic acid level 45 on Depakote 1000 mg BID, dose was increased to Depakote 750 mg four times daily on 3/27, check level again 4/1 - Continue Haldol 10 mg BID - Ativan 1 mg BID - Ambien 5 mg QHS  3) Mild asthma without complication- established problem, controlled - Albutrol PRN  4) Slow transit constipation- established problem, controlled  - Colace 100 mg BID, milk of magnesia PRN  5) Orthostatic hypotension- established problem, controlled - Midodrine 10 mg TID before meals  6) Tobacco Use disorder - Nicoderm 21 mg patch   7) Unspecified dysphagia- patient chocked on food on 3/20 - Continue dysphagia 3 diet with all level liquids. Still no coughing or signs of aspiration at this time.   05/18/20: Psychiatric exam above reviewed and remains accurate. Assessment and plan above reviewed  and updated.     Salley Scarlet, MD 05/18/2020, 1:23 PM

## 2020-05-18 NOTE — Progress Notes (Signed)
Recreation Therapy Notes   Date: 05/18/2020  Time: 9:30 am   Location: Craft room     Behavioral response: N/A   Intervention Topic: Relaxation    Discussion/Intervention: Patient did not attend group.   Clinical Observations/Feedback:  Patient did not attend group.   Taeveon Keesling LRT/CTRS        Loui Massenburg 05/18/2020 12:19 PM

## 2020-05-19 LAB — CBC WITH DIFFERENTIAL/PLATELET
Abs Immature Granulocytes: 0.04 10*3/uL (ref 0.00–0.07)
Basophils Absolute: 0 10*3/uL (ref 0.0–0.1)
Basophils Relative: 1 %
Eosinophils Absolute: 0.1 10*3/uL (ref 0.0–0.5)
Eosinophils Relative: 4 %
HCT: 32.5 % — ABNORMAL LOW (ref 39.0–52.0)
Hemoglobin: 10.7 g/dL — ABNORMAL LOW (ref 13.0–17.0)
Immature Granulocytes: 1 %
Lymphocytes Relative: 17 %
Lymphs Abs: 0.6 10*3/uL — ABNORMAL LOW (ref 0.7–4.0)
MCH: 30.1 pg (ref 26.0–34.0)
MCHC: 32.9 g/dL (ref 30.0–36.0)
MCV: 91.3 fL (ref 80.0–100.0)
Monocytes Absolute: 0.4 10*3/uL (ref 0.1–1.0)
Monocytes Relative: 12 %
Neutro Abs: 2.4 10*3/uL (ref 1.7–7.7)
Neutrophils Relative %: 65 %
Platelets: 170 10*3/uL (ref 150–400)
RBC: 3.56 MIL/uL — ABNORMAL LOW (ref 4.22–5.81)
RDW: 14.7 % (ref 11.5–15.5)
WBC: 3.7 10*3/uL — ABNORMAL LOW (ref 4.0–10.5)
nRBC: 0 % (ref 0.0–0.2)

## 2020-05-19 NOTE — BHH Counselor (Signed)
CSW attempted to call Dorris Singh, CSW unable to leave a HIPAA compliant voicemail.  CSW spoke with Pam at [phone], she reports that she has one bed open.  Bed is for [employer] client.  She reports concerns for aggression but willing if medicated.  Reports that the home is a [facility] and she has grandchildren and other patients in the home.  She reports concerns at verbal aggression. She declined patient for verbal aggression concerns.   CSW returned a call back to Crisoforo Oxford at [facility] D[facility] home, [phone].  She requested information on aggressive behaviors and reports that "we don't take that" when CSW provided information on pt's aggressive behaviors.  Assunta Curtis, MSW, LCSW 05/19/2020 4:07 PM

## 2020-05-19 NOTE — Progress Notes (Signed)
Patient is better since start of shift. Bed changed and showered.  Watching TV with  no behavior issues observed yet.  Medication compliant and tolerated without incident. Denies SI  HI AVH depression pain or anxiety. Will continue to monitor for change is mood or status. He remains on 1:1 and is safe with sitter and Q15 minute safety checks.      Cleo Butler-Nicholson, LPN

## 2020-05-19 NOTE — Plan of Care (Addendum)
Pt denies depression, anxiety, SI, HI and AVH. Pt was educated on care plan and verbalizes understanding. [name] RN Problem: Education: Goal: Knowledge of [facility] Education information/materials will improve 05/19/2020 1526 by Kieth Brightly, RN Outcome: Progressing 05/19/2020 1526 by Kieth Brightly, RN Outcome: Progressing Goal: Emotional status will improve 05/19/2020 1526 by Kieth Brightly, RN Outcome: Progressing 05/19/2020 1526 by Kieth Brightly, RN Outcome: Progressing Goal: Mental status will improve 05/19/2020 1526 by Kieth Brightly, RN Outcome: Not Progressing 05/19/2020 1526 by Kieth Brightly, RN Outcome: Progressing Goal: Verbalization of understanding the information provided will improve 05/19/2020 1526 by Kieth Brightly, RN Outcome: Progressing 05/19/2020 1526 by Kieth Brightly, RN Outcome: Progressing   Problem: Activity: Goal: Interest or engagement in activities will improve 05/19/2020 1526 by Kieth Brightly, RN Outcome: Progressing 05/19/2020 1526 by Kieth Brightly, RN Outcome: Progressing Goal: Sleeping patterns will improve 05/19/2020 1526 by Kieth Brightly, RN Outcome: Progressing 05/19/2020 1526 by Kieth Brightly, RN Outcome: Progressing   Problem: Coping: Goal: Ability to verbalize frustrations and anger appropriately will improve 05/19/2020 1526 by Kieth Brightly, RN Outcome: Not Progressing 05/19/2020 1526 by Kieth Brightly, RN Outcome: Progressing Goal: Ability to demonstrate self-control will improve 05/19/2020 1526 by Kieth Brightly, RN Outcome: Progressing 05/19/2020 1526 by Kieth Brightly, RN Outcome: Progressing   Problem: Health Behavior/Discharge Planning: Goal: Identification of resources available to assist in meeting health care needs will improve 05/19/2020 1526 by Kieth Brightly, RN Outcome: Progressing 05/19/2020 1526 by Kieth Brightly, RN Outcome: Progressing Goal: Compliance with treatment plan for  underlying cause of condition will improve 05/19/2020 1526 by Kieth Brightly, RN Outcome: Progressing 05/19/2020 1526 by Kieth Brightly, RN Outcome: Progressing   Problem: Physical Regulation: Goal: Ability to maintain clinical measurements within normal limits will improve 05/19/2020 1526 by Kieth Brightly, RN Outcome: Progressing 05/19/2020 1526 by Kieth Brightly, RN Outcome: Progressing   Problem: Safety: Goal: Periods of time without injury will increase 05/19/2020 1526 by Kieth Brightly, RN Outcome: Progressing 05/19/2020 1526 by Kieth Brightly, RN Outcome: Progressing   Problem: Education: Goal: Ability to verbalize precipitating factors for violent behavior will improve 05/19/2020 1526 by Kieth Brightly, RN Outcome: Not Progressing 05/19/2020 1526 by Kieth Brightly, RN Outcome: Progressing   Problem: Coping: Goal: Ability to verbalize frustrations and anger appropriately will improve 05/19/2020 1526 by Kieth Brightly, RN Outcome: Not Progressing 05/19/2020 1526 by Kieth Brightly, RN Outcome: Progressing   Problem: Health Behavior/Discharge Planning: Goal: Ability to implement measures to prevent violent behavior in the future will improve 05/19/2020 1526 by Kieth Brightly, RN Outcome: Progressing 05/19/2020 1526 by Kieth Brightly, RN Outcome: Progressing   Problem: Safety: Goal: Ability to demonstrate self-control will improve 05/19/2020 1526 by Kieth Brightly, RN Outcome: Progressing 05/19/2020 1526 by Kieth Brightly, RN Outcome: Progressing Goal: Ability to redirect hostility and anger into socially appropriate behaviors will improve 05/19/2020 1526 by Kieth Brightly, RN Outcome: Not Progressing 05/19/2020 1526 by Kieth Brightly, RN Outcome: Progressing

## 2020-05-19 NOTE — Progress Notes (Signed)
[facility] MD Progress Note  05/19/2020 12:55 PM Austin Gates  MRN:  [id number]   CC "Am I leaving today?"  Subjective:  60 year old male with schizoaffective disorder, bipolar type and moderate ID (IQ 39). No acute events overnight, medication complaint, attending to ADLs.   Patient moved to main unit today, but remains on 1:1 for safety. This morning he is pleasant on interview. He continues to be hopeful about leaving the hospital to go to a new group home. He denies any suicidal ideations, homicidal ideations, visual hallucinations, or auditory hallucinations. He does not appear to be responding to internal stimuli. Group home search remains ongoing with out team as well as care coordinator- see social work notes for full details.   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 Hypertension   . Intellectual disability   . Obsessive-compulsive disorder   . Schizo-affective schizophrenia [facility])     Past Surgical History:  Procedure Laterality Date  . COLONOSCOPY WITH PROPOFOL N/A 12/28/2017   Procedure: COLONOSCOPY WITH PROPOFOL;  Surgeon: Jonathon Bellows, MD;  Location: [facility] ENDOSCOPY;  Service: Gastroenterology;  Laterality: N/A;  . HYDROCELE EXCISION Bilateral 02/22/2018   Procedure: bilateral HYDROCELECTOMY ADULT;  Surgeon: Cleon Gustin, MD;  Location: [place];  Service: Urology;  Laterality: Bilateral;  . INSERTION OF ILIAC STENT Left 02/22/2018   Procedure: INSERTION LEFT SUPERIOR FEMORAL ARTERY USING 6MM X 5CM VIABHON STENT with mynx device closure on right femoral artery;  Surgeon: Waynetta Sandy, MD;  Location: [place];  Service: Vascular;  Laterality: Left;  . KNEE ARTHROSCOPY    . SCROTAL EXPLORATION N/A 02/22/2018   Procedure: SCROTUM EXPLORATION;  Surgeon: Cleon Gustin, MD;  Location: [place];  Service: Urology;  Laterality: N/A;  . UPPER EXTREMITY ANGIOGRAM Left 02/22/2018   Procedure: left lower EXTREMITY ANGIOGRAM;  Surgeon: Waynetta Sandy, MD;  Location: [facility] OR;  Service: Vascular;  Laterality: Left;   Family History:  Family History  Problem Relation Age of Onset  . Diabetes Mother    Family Psychiatric  History: See H&P Social History:  Social History   Substance and Sexual Activity  Alcohol Use No     Social History   Substance and Sexual Activity  Drug Use No    Social History   Socioeconomic History  . Marital status: Single    Spouse name: Not on file  . Number of children: Not on file  . Years of education: Not on file  . Highest education level: Not on file  Occupational History  . Occupation: Disabled  Tobacco Use  . Smoking status: Former Smoker    Types: Cigarettes  . Smokeless tobacco: Never Used  Vaping Use  . Vaping Use: Unknown  Substance and Sexual Activity  . Alcohol use: No  . Drug use: No  . Sexual activity: Never  Other Topics Concern  . Not on file  Social History Narrative   ** Merged History Encounter **       Social Determinants of Health   Financial Resource Strain: Not on file  Food Insecurity: Not on file  Transportation Needs: Not on file  Physical Activity: Not on file  Stress: Not on file  Social Connections: Not on file   Additional Social History:     Sleep: Fair  Appetite:  Good  Current Medications: Current Facility-Administered Medications  Medication Dose Route Frequency Provider Last Rate Last Admin  . acetaminophen (TYLENOL) tablet 650 mg  650 mg Oral Q6H PRN [name], John T, MD      . albuterol (VENTOLIN HFA) 108 (90 Base) MCG/ACT inhaler 2 puff  2 puff Inhalation  Q4H PRN [name], John T, MD      . alum & mag hydroxide-simeth (MAALOX/MYLANTA) 200-200-20 MG/5ML suspension 30 mL  30 mL Oral Q4H PRN [name], John T, MD      . benztropine (COGENTIN) tablet 0.5 mg  0.5 mg Oral BID [name], [name], MD   0.5 mg at 05/19/20 4401  . chlorproMAZINE (THORAZINE) injection 50 mg  50 mg Intramuscular TID PRN [name], John T, MD   50 mg at 05/18/20 1820   And  . diphenhydrAMINE (BENADRYL) injection 25 mg  25 mg Intramuscular TID PRN [name], John T, MD      . clopidogrel (PLAVIX) tablet 75 mg  75 mg Oral Daily Salley Scarlet, MD   75 mg at 05/19/20 0272  . cloZAPine (CLOZARIL) tablet 175 mg  175 mg Oral Daily Salley Scarlet, MD   175 mg at 05/19/20 0920  . desmopressin (DDAVP) tablet 0.2 mg  0.2 mg Oral QHS Selina Cooley M, MD   0.2 mg at 05/18/20 2130  . haloperidol lactate (HALDOL) injection 5 mg  5 mg Intramuscular QID PRN Rulon Sera, MD   5 mg at 05/18/20 1229   And  . LORazepam (ATIVAN) injection 2 mg  2 mg Intramuscular Q4H PRN Rulon Sera, MD   2 mg at 05/18/20 1228   And  . diphenhydrAMINE (BENADRYL) injection 50 mg  50 mg Intramuscular Q4H PRN Rulon Sera, MD   50 mg at 05/18/20 1229  . docusate sodium (COLACE) capsule 100 mg  100 mg Oral BID PRN [name], [name], MD   100 mg at 05/17/20 0803  . feeding supplement (ENSURE ENLIVE / ENSURE PLUS) liquid 237 mL  237 mL Oral BID BM [name], John T, MD   237 mL at 05/19/20 0932  . ferrous sulfate tablet 325 mg  325 mg Oral Q breakfast [name], [name], MD   325 mg at 05/19/20 0921  . food thickener (THICK IT) powder   Oral PRN [name], John T, MD      . haloperidol (HALDOL) tablet 10 mg  10 mg Oral BID [name], [name], MD   10 mg at 05/19/20 5366  . haloperidol lactate (HALDOL) injection 5 mg  5 mg Intramuscular Once Rulon Sera, MD      . hydrocerin (EUCERIN) cream   Topical BID Salley Scarlet, MD   Given at 05/18/20 1726  . LORazepam (ATIVAN) tablet 1 mg  1 mg Oral BID Salley Scarlet, MD   1 mg at  05/19/20 4403  . magnesium hydroxide (MILK OF MAGNESIA) suspension 30 mL  30 mL Oral Daily PRN [name], John T, MD      . melatonin tablet 5 mg  5 mg Oral QHS PRN [name], [name], MD   5 mg at 05/16/20 2113  . midodrine (PROAMATINE) tablet 10 mg  10 mg Oral TID AC [name], [name], MD   10 mg at 05/19/20 0926  . neomycin-bacitracin-polymyxin (NEOSPORIN) ointment   Topical BID  Salley Scarlet, MD   Given at 05/18/20 1726  . nicotine (NICODERM CQ - dosed in mg/24 hours) patch 21 mg  21 mg Transdermal Daily [name], [name], MD   21 mg at 05/19/20 9485  . OLANZapine zydis (ZYPREXA) disintegrating tablet 10 mg  10 mg Oral QID PRN Rulon Sera, MD   10 mg at 05/19/20 1025  . polyethylene glycol (MIRALAX / GLYCOLAX) packet 17 g  17 g Oral Daily PRN [name], John T, MD      . traZODone (DESYREL) tablet 100 mg  100 mg Oral QHS PRN Caroline Sauger, NP   100 mg at 05/18/20 2129  . valproic acid (DEPAKENE) 250 MG/5ML solution 750 mg  750 mg Oral QID [name], [name], MD   750 mg at 05/19/20 4627  . zolpidem (AMBIEN) tablet 5 mg  5 mg Oral QHS Salley Scarlet, MD   5 mg at 05/18/20 2129    Lab Results:  Results for orders placed or performed during the hospital encounter of 05/07/20 (from the past 48 hour(s))  CBC with Differential/Platelet     Status: Abnormal   Collection Time: 05/18/20  6:24 AM  Result Value Ref Range   WBC 4.4 4.0 - 10.5 K/uL   RBC 3.47 (L) 4.22 - 5.81 MIL/uL   Hemoglobin 10.1 (L) 13.0 - 17.0 g/dL   HCT 31.7 (L) 39.0 - 52.0 %   MCV 91.4 80.0 - 100.0 fL   MCH 29.1 26.0 - 34.0 pg   MCHC 31.9 30.0 - 36.0 g/dL   RDW 14.9 11.5 - 15.5 %   Platelets 170 150 - 400 K/uL   nRBC 0.0 0.0 - 0.2 %   Neutrophils Relative % 65 %   Neutro Abs 2.9 1.7 - 7.7 K/uL   Lymphocytes Relative 22 %   Lymphs Abs 1.0 0.7 - 4.0 K/uL   Monocytes Relative 9 %   Monocytes Absolute 0.4 0.1 - 1.0 K/uL   Eosinophils Relative 2 %   Eosinophils Absolute 0.1 0.0 - 0.5 K/uL   Basophils Relative 1 %    Basophils Absolute 0.0 0.0 - 0.1 K/uL   Immature Granulocytes 1 %   Abs Immature Granulocytes 0.03 0.00 - 0.07 K/uL    Comment: Performed at [facility], [address] [place], [place] [address]  CBC with Differential/Platelet     Status: Abnormal   Collection Time: 05/19/20  6:50 AM  Result Value Ref Range   WBC 3.7 (L) 4.0 - 10.5 K/uL   RBC 3.56 (L) 4.22 - 5.81 MIL/uL   Hemoglobin 10.7 (L) 13.0 - 17.0 g/dL   HCT 32.5 (L) 39.0 - 52.0 %   MCV 91.3 80.0 - 100.0 fL   MCH 30.1 26.0 - 34.0 pg   MCHC 32.9 30.0 - 36.0 g/dL   RDW 14.7 11.5 - 15.5 %   Platelets 170 150 - 400 K/uL   nRBC 0.0 0.0 - 0.2 %   Neutrophils Relative % 65 %   Neutro Abs 2.4 1.7 - 7.7 K/uL   Lymphocytes Relative 17 %   Lymphs Abs 0.6 (L) 0.7 - 4.0 K/uL   Monocytes Relative 12 %   Monocytes Absolute 0.4 0.1 - 1.0 K/uL   Eosinophils Relative 4 %   Eosinophils Absolute 0.1 0.0 - 0.5 K/uL   Basophils Relative 1 %   Basophils Absolute 0.0 0.0 - 0.1 K/uL   Immature Granulocytes 1 %   Abs Immature Granulocytes 0.04 0.00 - 0.07 K/uL  Comment: Performed at [facility], [address] [place], [place] [address]    Blood Alcohol level:  Lab Results  Component Value Date   [facility] <10 04/30/2020   ETH <10 60/45/4098    Metabolic Disorder Labs: Lab Results  Component Value Date   HGBA1C 5.4 09/30/2016   MPG 108.28 09/30/2016   MPG 114 09/17/2016   Lab Results  Component Value Date   PROLACTIN 11.8 05/01/2020   Lab Results  Component Value Date   CHOL 135 09/30/2016   TRIG 104 02/25/2018   HDL 60 09/30/2016   CHOLHDL 2.3 09/30/2016   VLDL 12 09/30/2016   LDLCALC 63 09/30/2016    Physical Findings: AIMS: Facial and Oral Movements Muscles of Facial Expression: None, normal Lips and Perioral Area: None, normal Jaw: None, normal Tongue: Moderate,Extremity Movements Upper (arms, wrists, hands, fingers): None, normal Lower (legs, knees, ankles, toes): None, normal, Trunk  Movements Neck, shoulders, hips: None, normal, Overall Severity Severity of abnormal movements (highest score from questions above): None, normal Incapacitation due to abnormal movements: None, normal Patient's awareness of abnormal movements (rate only patient's report): No Awareness, Dental Status Current problems with teeth and/or dentures?: No Does patient usually wear dentures?: No  CIWA:    COWS:     Musculoskeletal: Strength & Muscle Tone: within normal limits Gait & Station: unsteady Patient leans: N/A  Psychiatric Specialty Exam:  Presentation  General Appearance: Casual  Eye Contact:Fair  Speech:Garbled  Speech Volume:Increased  Handedness:Right   Mood and Affect  Mood: Euthymic Affect: Congruent, bright, smiling  Thought Process  Thought Processes:Goal Directed  Descriptions of Associations:Loose  Orientation:-- (Oriented to person and place)  Thought Content: Perseveration on discharge and food  History of Schizophrenia/Schizoaffective disorder:Yes Duration of Psychotic Symptoms:Greater than 6 months Hallucinations:Denies   Ideas of Reference:None  Suicidal Thoughts: Denies Homicidal Thoughts:Denies  Sensorium  Memory:Immediate Poor; Recent Poor; Remote Poor  Judgment:Impaired  Insight:Lacking   Executive Functions  Concentration:Poor  Attention Span:Poor  Recall:Poor  Fund of Knowledge:Poor  Language:Poor   Psychomotor Activity  Psychomotor Activity:Restless  Assets  Assets:Desire for Improvement; Financial Resources/Insurance; Social Support   Sleep  Sleep:Sleep: Fair Number of Hours of Sleep: 7.5    Physical Exam: Physical Exam  ROS  Blood pressure (!) 123/97, pulse 99, temperature 98.6 F (37 C), temperature source Oral, resp. rate 18, height 5\' 11"  (1.803 m), weight 75.8 kg, SpO2 92 %. Body mass index is 23.29 kg/m.   Treatment Plan Summary: Daily contact with patient to assess and evaluate symptoms and  progress in treatment and Medication management   1-2)Schizoaffective disorder, bipolar type; moderate ID (IQ 48)- behavioral disturbance, agitation - Continue Clozaril 175 mg QHS, ANC 2442 today, daily CBC for ANC checks. Will titrate back to home dose of 300 mg QHS as able. No increase today as ANC continues to decline each day although still within normal limits  -Valproic acid level 45 on Depakote 1000 mg BID, dose was increased to Depakote 750 mg four times daily on 3/27, check level again 4/1 - Continue Haldol 10 mg BID - Ativan 1 mg BID - Ambien 5 mg QHS  3) Mild asthma without complication- established problem, controlled - Albutrol PRN  4) Slow transit constipation- established problem, controlled  - Colace 100 mg BID, milk of magnesia PRN  5) Orthostatic hypotension- established problem, controlled - Midodrine 10 mg TID before meals  6) Tobacco Use disorder - Nicoderm 21 mg patch   7) Unspecified dysphagia- patient chocked on  food on 3/20 - Continue dysphagia 3 diet with all level liquids. Still no coughing or signs of aspiration at this time.   05/19/20: Psychiatric exam above reviewed and remains accurate. Assessment and plan above reviewed and updated.      Salley Scarlet, MD 05/19/2020, 12:55 PM

## 2020-05-19 NOTE — Progress Notes (Signed)
Pt has been calm and cooperative. pt was med med compliant. Pt has a one to one sitter. Austin Gates

## 2020-05-19 NOTE — Plan of Care (Signed)
Patient presents at his baseline   Problem: Education: Goal: Emotional status will improve Outcome: Not Progressing Goal: Mental status will improve Outcome: Not Progressing

## 2020-05-19 NOTE — Progress Notes (Signed)
Pt has been increasingly agitated. Pt is slamming doors, cursing loudly and threatening staff. [name] RN

## 2020-05-19 NOTE — Progress Notes (Signed)
Patient has been asleep since this writer got here. Patient is on 1:1 for safety with a sitter present. Patient being monitored and continues to be safe on the unit.

## 2020-05-19 NOTE — B[facility] Counselor (Signed)
CSW received call back from [facility], Care Coordinator, [phone].  He requested update on the patient's care.  CSW informed that patient remains somewhat aggressive as evidenced by yelling, banging on walls and breaking room door.  CSW informed that [facility] confirmed that patient was on the waitlist for a bed, however, CSW was encouraged to continue to look for bed placement for the patient.  CSW requested assistance from Care Coordinator in looking for bed.  Care Coordinator reported that he has been working with the patient for 8 months, 4 of which were used for placement.  He reports a belief that he has contacted most of the state and is unaware of anyone that would accept patient with the behaviors reported.  CSW reported understanding, however, placement must continue to be a priority.    Care Coordinator expressed belief that patient's behaviors are related to his [place] as opposed to behavior. CSW expressed concern that the patient's behaviors are primary as opposed to [facility].  CSW did ask for background information that has been requested previously from homes posible interested in patient including length of stay in jail "about 4 1/2 months" and 4 months at [facility] prior to that.  He was in a group home prior to his time at [facility].  Care Coordinator did report that he would speak with his superviosr and begin again referring the patient but only to places "that make sense".  He was able to confirm that patient is on the [employer] waiver.   Assunta Curtis, MSW, LCSW 05/19/2020 12:50 PM

## 2020-05-19 NOTE — Progress Notes (Signed)
Pt has become loud and banging on the door. Pt continues to yell even after being redirected and given moral support. [name] RN

## 2020-05-19 NOTE — B[facility] Group Notes (Signed)
LCSW Group Therapy Note       05/19/2020 2:25 PM      Type of Therapy/Topic:  Group Therapy: Emotional Regulation        Participation Level:  Did not attend     Description of Group:     Milieu was in state of chaos. Group was held outside to utilize coping skills to help deal with strong emotions. Positive recreational activity and mindfulness were practiced to assist with distraction/amelioration of negative emotional outbursts and overload.        Therapeutic Goals:   1. Patient will identify positive activities to help distract/ameliorate negative emotions.   2. Patient will demonstrate positive leisure activity/mindfulness practice.    Maddax Palinkas [place], MSW, LCSW-A 3/29/20222:26 PM   Summary of Patient Progress:  X        Therapeutic Modalities:   Mindfulness   Positive leisure       Sachin Ferencz [place], MSW, LCSW-A 3/29/20222:25 PM

## 2020-05-19 NOTE — Progress Notes (Signed)
Recreation Therapy Notes   Date: 05/19/2020  Time: 2:30 pm   Location: Craft room     Behavioral response: N/A   Intervention Topic: Self-care    Discussion/Intervention: Patient did not attend group.   Clinical Observations/Feedback:  Patient did not attend group.   Johnchristopher Sarvis LRT/CTRS         Tomoki Lucken 05/19/2020 4:12 PM

## 2020-05-20 LAB — CBC WITH DIFFERENTIAL/PLATELET
Abs Immature Granulocytes: 0.03 10*3/uL (ref 0.00–0.07)
Basophils Absolute: 0 10*3/uL (ref 0.0–0.1)
Basophils Relative: 0 %
Eosinophils Absolute: 0.1 10*3/uL (ref 0.0–0.5)
Eosinophils Relative: 2 %
HCT: 39.5 % (ref 39.0–52.0)
Hemoglobin: 12.6 g/dL — ABNORMAL LOW (ref 13.0–17.0)
Immature Granulocytes: 1 %
Lymphocytes Relative: 17 %
Lymphs Abs: 1 10*3/uL (ref 0.7–4.0)
MCH: 29.2 pg (ref 26.0–34.0)
MCHC: 31.9 g/dL (ref 30.0–36.0)
MCV: 91.6 fL (ref 80.0–100.0)
Monocytes Absolute: 0.4 10*3/uL (ref 0.1–1.0)
Monocytes Relative: 7 %
Neutro Abs: 4 10*3/uL (ref 1.7–7.7)
Neutrophils Relative %: 73 %
Platelets: 211 10*3/uL (ref 150–400)
RBC: 4.31 MIL/uL (ref 4.22–5.81)
RDW: 15.3 % (ref 11.5–15.5)
WBC: 5.6 10*3/uL (ref 4.0–10.5)
nRBC: 0 % (ref 0.0–0.2)

## 2020-05-20 MED ORDER — CLOZAPINE 100 MG PO TABS
200.0000 mg | ORAL_TABLET | Freq: Every day | ORAL | Status: DC
  Administered 2020-05-21 – 2020-06-15 (×26): 200 mg via ORAL
  Filled 2020-05-20: qty 8
  Filled 2020-05-20 (×15): qty 2
  Filled 2020-05-20: qty 8
  Filled 2020-05-20 (×9): qty 2

## 2020-05-20 NOTE — Progress Notes (Signed)
Patient has had no behavior issues today. Continues to be on 1:1. Spending most of his time in the day room watching TV and interacting with other peers.  He was med compliant and tolerated his meds without incident. He denies SI  HI AVH depression anxiety and pain. He is safe on the unit with 15 minute safety checks and 1:1 sitter monitor.   Cleo Butler-Nicholson, LPN

## 2020-05-20 NOTE — Progress Notes (Addendum)
[facility] MD Progress Note  05/20/2020 1:09 PM Austin Gates  MRN:  [id number]   CC "Can I leave by Friday"  Subjective:  60 year old male with schizoaffective disorder, bipolar type and moderate ID (IQ 32). No acute events overnight, medication complaint, attending to ADLs.   Patient continues on 1:1 for safety. Patient is elevated and loud today, but has not been aggressive. He has been able to maintain safety on the main unit without signs of aggression towards staff or peers. He remains fixated on leaving the hospital as soon as possible, but continues to express that he does not wish to return to previous group home. He denies any suicidal ideations, homicidal ideations, visual hallucinations, or auditory hallucinations. He does not appear to be responding to internal stimuli. Group home search remains ongoing with out team as well as care coordinator- see social work notes for full details.   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 Hypertension   . Intellectual disability   . Obsessive-compulsive disorder   . Schizo-affective schizophrenia [facility])     Past Surgical History:  Procedure Laterality Date  . COLONOSCOPY WITH PROPOFOL N/A 12/28/2017   Procedure: COLONOSCOPY WITH PROPOFOL;  Surgeon: Jonathon Bellows, MD;  Location: [facility] ENDOSCOPY;  Service: Gastroenterology;  Laterality: N/A;  . HYDROCELE EXCISION Bilateral 02/22/2018   Procedure: bilateral HYDROCELECTOMY ADULT;  Surgeon: Cleon Gustin, MD;  Location: [place];  Service: Urology;  Laterality: Bilateral;  . INSERTION OF ILIAC  STENT Left 02/22/2018   Procedure: INSERTION LEFT SUPERIOR FEMORAL ARTERY USING 6MM X 5CM VIABHON STENT with mynx device closure on right femoral artery;  Surgeon: Waynetta Sandy, MD;  Location: [place];  Service: Vascular;  Laterality: Left;  . KNEE ARTHROSCOPY    . SCROTAL EXPLORATION N/A 02/22/2018   Procedure: SCROTUM EXPLORATION;  Surgeon: Cleon Gustin, MD;  Location: [place];  Service: Urology;  Laterality: N/A;  . UPPER EXTREMITY ANGIOGRAM Left 02/22/2018   Procedure: left lower EXTREMITY ANGIOGRAM;  Surgeon: Waynetta Sandy, MD;  Location: [facility] OR;  Service: Vascular;  Laterality: Left;   Family History:  Family History  Problem Relation Age of Onset  . Diabetes Mother    Family Psychiatric  History: See H&P Social History:  Social History   Substance and Sexual Activity  Alcohol Use No     Social History   Substance and Sexual Activity  Drug Use No    Social History   Socioeconomic History  . Marital status: Single    Spouse name: Not on file  . Number of children: Not on file  . Years of education: Not on file  . Highest education level: Not on file  Occupational History  . Occupation: Disabled  Tobacco Use  . Smoking status: Former Smoker    Types: Cigarettes  . Smokeless tobacco: Never Used  Vaping Use  . Vaping Use: Unknown  Substance and Sexual Activity  . Alcohol use: No  . Drug use: No  . Sexual activity: Never  Other Topics Concern  . Not on file  Social History Narrative   ** Merged History Encounter **  Social Determinants of Health   Financial Resource Strain: Not on file  Food Insecurity: Not on file  Transportation Needs: Not on file  Physical Activity: Not on file  Stress: Not on file  Social Connections: Not on file   Additional Social History:     Sleep: Fair  Appetite:  Good  Current Medications: Current Facility-Administered Medications  Medication Dose Route Frequency Provider Last Rate Last Admin   . acetaminophen (TYLENOL) tablet 650 mg  650 mg Oral Q6H PRN [name], John T, MD      . albuterol (VENTOLIN HFA) 108 (90 Base) MCG/ACT inhaler 2 puff  2 puff Inhalation Q4H PRN [name], John T, MD      . alum & mag hydroxide-simeth (MAALOX/MYLANTA) 200-200-20 MG/5ML suspension 30 mL  30 mL Oral Q4H PRN [name], John T, MD      . benztropine (COGENTIN) tablet 0.5 mg  0.5 mg Oral BID [name], John T, MD   0.5 mg at 05/20/20 0747  . chlorproMAZINE (THORAZINE) injection 50 mg  50 mg Intramuscular TID PRN [name], John T, MD   50 mg at 05/19/20 1256   And  . diphenhydrAMINE (BENADRYL) injection 25 mg  25 mg Intramuscular TID PRN [name], [name], MD   25 mg at 05/19/20 1300  . clopidogrel (PLAVIX) tablet 75 mg  75 mg Oral Daily Salley Scarlet, MD   75 mg at 05/20/20 0747  . cloZAPine (CLOZARIL) tablet 175 mg  175 mg Oral Daily Salley Scarlet, MD   175 mg at 05/20/20 0747  . desmopressin (DDAVP) tablet 0.2 mg  0.2 mg Oral QHS Selina Cooley M, MD   0.2 mg at 05/18/20 2130  . haloperidol lactate (HALDOL) injection 5 mg  5 mg Intramuscular QID PRN Rulon Sera, MD   5 mg at 05/19/20 1440   And  . LORazepam (ATIVAN) injection 2 mg  2 mg Intramuscular Q4H PRN Rulon Sera, MD   2 mg at 05/19/20 1440   And  . diphenhydrAMINE (BENADRYL) injection 50 mg  50 mg Intramuscular Q4H PRN Rulon Sera, MD   50 mg at 05/18/20 1229  . docusate sodium (COLACE) capsule 100 mg  100 mg Oral BID PRN [name], [name], MD   100 mg at 05/17/20 0803  . feeding supplement (ENSURE ENLIVE / ENSURE PLUS) liquid 237 mL  237 mL Oral BID BM [name], John T, MD   237 mL at 05/19/20 1609  . ferrous sulfate tablet 325 mg  325 mg Oral Q breakfast [name], [name], MD   325 mg at 05/20/20 0747  . food thickener (THICK IT) powder   Oral PRN [name], John T, MD      . haloperidol (HALDOL) tablet 10 mg  10 mg Oral BID [name], [name], MD   10 mg at 05/20/20 0746  . haloperidol lactate (HALDOL) injection 5 mg  5 mg Intramuscular Once  Rulon Sera, MD      . hydrocerin (EUCERIN) cream   Topical BID Salley Scarlet, MD   Given at 05/20/20 [phone]  . LORazepam (ATIVAN) tablet 1 mg  1 mg Oral BID Salley Scarlet, MD   1 mg at 05/20/20 0747  . magnesium hydroxide (MILK OF MAGNESIA) suspension 30 mL  30 mL Oral Daily PRN [name], John T, MD      . melatonin tablet 5 mg  5 mg Oral QHS PRN [name], [name], MD   5 mg at 05/16/20 2113  . midodrine (PROAMATINE) tablet  10 mg  10 mg Oral TID AC [name], John T, MD   10 mg at 05/20/20 1140  . neomycin-bacitracin-polymyxin (NEOSPORIN) ointment   Topical BID Salley Scarlet, MD   Given at 05/20/20 [phone]  . nicotine (NICODERM CQ - dosed in mg/24 hours) patch 21 mg  21 mg Transdermal Daily [name], [name], MD   21 mg at 05/20/20 0747  . OLANZapine zydis (ZYPREXA) disintegrating tablet 10 mg  10 mg Oral QID PRN Rulon Sera, MD   10 mg at 05/19/20 1025  . polyethylene glycol (MIRALAX / GLYCOLAX) packet 17 g  17 g Oral Daily PRN [name], John T, MD      . traZODone (DESYREL) tablet 100 mg  100 mg Oral QHS PRN Caroline Sauger, NP   100 mg at 05/18/20 2129  . valproic acid (DEPAKENE) 250 MG/5ML solution 750 mg  750 mg Oral QID [name], John T, MD   750 mg at 05/20/20 1141  . zolpidem (AMBIEN) tablet 5 mg  5 mg Oral QHS Salley Scarlet, MD   5 mg at 05/18/20 2129    Lab Results:  Results for orders placed or performed during the hospital encounter of 05/07/20 (from the past 48 hour(s))  CBC with Differential/Platelet     Status: Abnormal   Collection Time: 05/19/20  6:50 AM  Result Value Ref Range   WBC 3.7 (L) 4.0 - 10.5 K/uL   RBC 3.56 (L) 4.22 - 5.81 MIL/uL   Hemoglobin 10.7 (L) 13.0 - 17.0 g/dL   HCT 32.5 (L) 39.0 - 52.0 %   MCV 91.3 80.0 - 100.0 fL   MCH 30.1 26.0 - 34.0 pg   MCHC 32.9 30.0 - 36.0 g/dL   RDW 14.7 11.5 - 15.5 %   Platelets 170 150 - 400 K/uL   nRBC 0.0 0.0 - 0.2 %   Neutrophils Relative % 65 %   Neutro Abs 2.4 1.7 - 7.7 K/uL   Lymphocytes Relative 17 %    Lymphs Abs 0.6 (L) 0.7 - 4.0 K/uL   Monocytes Relative 12 %   Monocytes Absolute 0.4 0.1 - 1.0 K/uL   Eosinophils Relative 4 %   Eosinophils Absolute 0.1 0.0 - 0.5 K/uL   Basophils Relative 1 %   Basophils Absolute 0.0 0.0 - 0.1 K/uL   Immature Granulocytes 1 %   Abs Immature Granulocytes 0.04 0.00 - 0.07 K/uL    Comment: Performed at [facility], [address] [place], [place] [address]  CBC with Differential/Platelet     Status: Abnormal   Collection Time: 05/20/20  8:04 AM  Result Value Ref Range   WBC 5.6 4.0 - 10.5 K/uL   RBC 4.31 4.22 - 5.81 MIL/uL   Hemoglobin 12.6 (L) 13.0 - 17.0 g/dL   HCT 39.5 39.0 - 52.0 %   MCV 91.6 80.0 - 100.0 fL   MCH 29.2 26.0 - 34.0 pg   MCHC 31.9 30.0 - 36.0 g/dL   RDW 15.3 11.5 - 15.5 %   Platelets 211 150 - 400 K/uL   nRBC 0.0 0.0 - 0.2 %   Neutrophils Relative % 73 %   Neutro Abs 4.0 1.7 - 7.7 K/uL   Lymphocytes Relative 17 %   Lymphs Abs 1.0 0.7 - 4.0 K/uL   Monocytes Relative 7 %   Monocytes Absolute 0.4 0.1 - 1.0 K/uL   Eosinophils Relative 2 %   Eosinophils Absolute 0.1 0.0 - 0.5 K/uL   Basophils Relative 0 %  Basophils Absolute 0.0 0.0 - 0.1 K/uL   Immature Granulocytes 1 %   Abs Immature Granulocytes 0.03 0.00 - 0.07 K/uL    Comment: Performed at [facility], [address] [place], [place] [address]    Blood Alcohol level:  Lab Results  Component Value Date   [facility] <10 04/30/2020   ETH <10 80/99/8338    Metabolic Disorder Labs: Lab Results  Component Value Date   HGBA1C 5.4 09/30/2016   MPG 108.28 09/30/2016   MPG 114 09/17/2016   Lab Results  Component Value Date   PROLACTIN 11.8 05/01/2020   Lab Results  Component Value Date   CHOL 135 09/30/2016   TRIG 104 02/25/2018   HDL 60 09/30/2016   CHOLHDL 2.3 09/30/2016   VLDL 12 09/30/2016   LDLCALC 63 09/30/2016    Physical Findings: AIMS: Facial and Oral Movements Muscles of Facial Expression: None, normal Lips and Perioral Area:  None, normal Jaw: None, normal Tongue: Moderate,Extremity Movements Upper (arms, wrists, hands, fingers): None, normal Lower (legs, knees, ankles, toes): None, normal, Trunk Movements Neck, shoulders, hips: None, normal, Overall Severity Severity of abnormal movements (highest score from questions above): None, normal Incapacitation due to abnormal movements: None, normal Patient's awareness of abnormal movements (rate only patient's report): No Awareness, Dental Status Current problems with teeth and/or dentures?: No Does patient usually wear dentures?: No  CIWA:    COWS:     Musculoskeletal: Strength & Muscle Tone: within normal limits Gait & Station: unsteady Patient leans: N/A  Psychiatric Specialty Exam:  Presentation  General Appearance: Casual  Eye Contact:Fair  Speech:Garbled  Speech Volume:Increased  Handedness:Right   Mood and Affect  Mood: Euthymic Affect: Congruent, bright, smiling  Thought Process  Thought Processes:Goal Directed  Descriptions of Associations:Loose  Orientation:-- (Oriented to person and place)  Thought Content: Perseveration on discharge and food  History of Schizophrenia/Schizoaffective disorder:Yes Duration of Psychotic Symptoms:Greater than 6 months Hallucinations:Denies   Ideas of Reference:None  Suicidal Thoughts: Denies Homicidal Thoughts:Denies  Sensorium  Memory:Immediate Poor; Recent Poor; Remote Poor  Judgment:Impaired  Insight:Lacking   Executive Functions  Concentration:Poor  Attention Span:Poor  Recall:Poor  Fund of Knowledge:Poor  Language:Poor   Psychomotor Activity  Psychomotor Activity:Restless  Assets  Assets:Desire for Improvement; Financial Resources/Insurance; Social Support   Sleep  Sleep:Sleep: Good Number of Hours of Sleep: 8    Physical Exam: Physical Exam  ROS  Blood pressure 106/68, pulse 85, temperature 97.7 F (36.5 C), temperature source Oral, resp. rate 17,  height 5\' 11"  (1.803 m), weight 75.8 kg, SpO2 97 %. Body mass index is 23.29 kg/m.   Treatment Plan Summary: Daily contact with patient to assess and evaluate symptoms and progress in treatment and Medication management   1-2)Schizoaffective disorder, bipolar type; moderate ID (IQ 48)- behavioral disturbance, agitation - Increase Clozaril 200 mg QHS, ANC 4144 today, weekly CBC for ANC checks. Will titrate back to home dose of 300 mg QHS as able. [place] 2505 today.  -Valproic acid level 45 on Depakote 1000 mg BID, dose was increased to Depakote 750 mg four times daily on 3/27, check level again 4/1 - Continue Haldol 10 mg BID - Ativan 1 mg BID - Ambien 5 mg QHS  3) Mild asthma without complication- established problem, controlled - Albutrol PRN  4) Slow transit constipation- established problem, controlled  - Colace 100 mg BID, milk of magnesia PRN  5) Orthostatic hypotension- established problem, controlled - Midodrine 10 mg TID before meals  6) Tobacco Use disorder -  Nicoderm 21 mg patch   7) Unspecified dysphagia- patient chocked on food on 3/20 - Continue dysphagia 2 diet with nectar thick liquids. Continue to monitor for coughing or signs of aspiration while eating   05/20/20: Psychiatric exam above reviewed and remains accurate. Assessment and plan above reviewed and updated.     Salley Scarlet, MD 05/20/2020, 1:09 PM

## 2020-05-20 NOTE — Progress Notes (Signed)
Patient remains on 1:1 for safety with a sitter present. Pt is asleep and without concern at this time. Pt remains safe on the unit.

## 2020-05-20 NOTE — Plan of Care (Signed)
Patient asking for discharge multiple times states " I don't mind to be homeless." No aggressive behaviors noted.Compliant with medications. No signs of aspiration noted. Safety maintained with 1:1 sitter.

## 2020-05-20 NOTE — Progress Notes (Signed)
Patient pleasant and little intrusive with staff but easily redirectable.No aggressive behaviors noted. Denies SI,HI and AVH. ADLs maintained. Compliant with medications. Patient out in the milieu with 1: 1 sitter.

## 2020-05-20 NOTE — Plan of Care (Signed)
  Problem: Education: Goal: Emotional status will improve Outcome: Progressing Goal: Mental status will improve Outcome: Progressing   Problem: Education: Goal: Mental status will improve Outcome: Progressing   Problem: Coping: Goal: Ability to verbalize frustrations and anger appropriately will improve Outcome: Progressing   Problem: Safety: Goal: Ability to demonstrate self-control will improve Outcome: Progressing Goal: Ability to redirect hostility and anger into socially appropriate behaviors will improve Outcome: Progressing

## 2020-05-20 NOTE — BHH Counselor (Signed)
CSW contacted [facility] regarding pt status on wait list. CSW was informed that the pt remains on the wait list. No other concerns expressed. Contact ended without incident.   Chalmers Guest. Guerry Bruin, MSW, LCSW, [place] 05/20/2020 3:13 PM

## 2020-05-20 NOTE — Progress Notes (Signed)
Pharmacy - Clozapine     This patient's order has been reviewed for prescribing contraindications.   Labs:    Date ANC Dose 3/10   2400 50mg  3/11  700 DC'ed 3/12 2900   25mg  3/13 2600 25mg  3/14 4000 25mg  3/15 2900    25mg  3/16 2800 25mg  3/17 3400   *transferred to Bed Med from 1A 3/18     - 50 mg ordered 3/19 4300 50mg  3/20 5100 50mg  3/21 - 75mg  ordered 3/22 - 75mg  3/23 4800 100mg  ordered 3/24 - 125mg  ordered 3/25 - 150mg  ordered 6/34 - 175mg  ordered 3/26 3500 175mg  3/27 3100 175mg  3/28 2900 175mg  3/29 2400 175mg  3/30 4000 175mg  Ok to switch back to weekly monitoring per Dr Domingo Cocking   The medication is being dispensed pursuant to the FDA REMS suspension order of 01/10/20 that allows for dispensing without a patient REMS dispense authorization (RDA).   Dispense rationale obtained [id number]   Lu Duffel, PharmD Clinical Pharmacist 05/20/2020 11:13 AM

## 2020-05-20 NOTE — BHH Counselor (Signed)
CSW provided updated pt information to [facility] from 05/16/20 to 05/20/20. [place] regarding pt status on wait list. CSW was informed that the pt remains on the wait list. No other concerns expressed. Contact ended without incident.   Perel Hauschild [place], MSW, LCSW-A 3/30/20224:35 PM

## 2020-05-20 NOTE — Progress Notes (Signed)
Pt remains 1:1 for safety with a sitter present. Pt is asleep and without complaint. Pt remains safe on the unit

## 2020-05-20 NOTE — BHH Group Notes (Signed)
LCSW Group Therapy Note  05/20/2020 2:09 PM  Type of Therapy/Topic:  Group Therapy:  Emotion Regulation  Participation Level:  Did Not Attend   Description of Group:   The purpose of this group is to assist patients in learning to regulate negative emotions and experience positive emotions. Patients will be guided to discuss ways in which they have been vulnerable to their negative emotions. These vulnerabilities will be juxtaposed with experiences of positive emotions or situations, and patients will be challenged to use positive emotions to combat negative ones. Special emphasis will be placed on coping with negative emotions in conflict situations, and patients will process healthy conflict resolution skills.  Therapeutic Goals: 1. Patient will identify two positive emotions or experiences to reflect on in order to balance out negative emotions 2. Patient will label two or more emotions that they find the most difficult to experience 3. Patient will demonstrate positive conflict resolution skills through discussion and/or role plays  Summary of Patient Progress: X  Therapeutic Modalities:   Cognitive Behavioral Therapy Feelings Identification Dialectical Behavioral Therapy  Wladyslaw Henrichs R. Guerry Bruin, MSW, [place], [place] 05/20/2020 2:09 PM

## 2020-05-20 NOTE — Plan of Care (Signed)
  Problem: Disruptive/Impulsive Goal: STG - Patient will focus on task/topic with 2 prompts from staff within 5 recreation therapy group sessions Description: STG - Patient will focus on task/topic with 2 prompts from staff within 5 recreation therapy group sessions Outcome: Not Progressing

## 2020-05-21 NOTE — BHH Group Notes (Signed)
LCSW Group Therapy Note     05/21/2020 1:53 PM     Type of Therapy/Topic:  Group Therapy:  Balance in Life     Participation Level:  Active     Description of Group:    This group will address the concept of balance and how it feels and looks when one is unbalanced. Patients will be encouraged to process areas in their lives that are out of balance and identify reasons for remaining unbalanced. Facilitators will guide patients in utilizing problem-solving interventions to address and correct the stressor making their life unbalanced. Understanding and applying boundaries will be explored and addressed for obtaining and maintaining a balanced life. Patients will be encouraged to explore ways to assertively make their unbalanced needs known to significant others in their lives, using other group members and facilitator for support and feedback.     Therapeutic Goals:  1.      Patient will identify two or more emotions or situations they have that consume much of in their lives.  2.      Patient will identify signs/triggers that life has become out of balance:  3.      Patient will identify two ways to set boundaries in order to achieve balance in their lives:  4.      Patient will demonstrate ability to communicate their needs through discussion and/or role plays     Summary of Patient Progress: Pt asked the nurses and CSW questions about where they attended school. Pt was cooperative and attentive.       Therapeutic Modalities:   Cognitive Behavioral Therapy  Solution-Focused Therapy  Assertiveness Training     Lajuanda Penick [place] MSW, LCSW-A  05/21/2020 1:53 PM

## 2020-05-21 NOTE — Progress Notes (Signed)
Recreation Therapy Notes  Date: 05/21/2020   Time: 10:00 am   Location: Craft room     Behavioral response: N/A   Intervention Topic: Leisure    Discussion/Intervention: Patient did not attend group.   Clinical Observations/Feedback:  Patient did not attend group.   Eura Mccauslin LRT/CTRS        Lizzie Cokley 05/21/2020 10:19 AM

## 2020-05-21 NOTE — Progress Notes (Signed)
Patient is on 1:1 for safety with a sitter present. Pt presents with the best affect since he has been admitted and is at his baseline. Patient given education, support, and encouragement to be active in his treatment plan. Patient observed interacting appropriately with staff and peers on the unit. Pt compliant with medication administration per MD orders.

## 2020-05-21 NOTE — Plan of Care (Signed)
Patient presents at his baseline   Problem: Education: Goal: Emotional status will improve Outcome: Not Progressing Goal: Mental status will improve Outcome: Not Progressing

## 2020-05-21 NOTE — Progress Notes (Signed)
Pt is alert and oriented to person, place, but not to time or situation. Pt is calm, cooperative, is intrusive but easily redirectable. Pt is childlike, mood is elevated, pt spends time watching tv in the dayroom, is medication compliant. Pt denies any suicidal and homicidal ideation, denies hallucinations, denies feelings of depression and anxiety. No signs of agitation or aggressive thus far. Pt remains on a 1:1 with staff for safety. Will continue to monitor pt per Q15 minute face checks and monitor for safety and progress.

## 2020-05-21 NOTE — BHH Counselor (Signed)
CSW contacted the following for potential placement:  [facility] [place] [phone] Was advised to call the main office [phone]  [facility] [phone] HIPPA compliant [place] (ICF/MR) [phone] HIPPA compliant [place] [phone] HIPPA compliant [place] [phone] HIPPA compliant [place] [phone] HIPPA compliant [place] [phone] HIPPA compliant [place] [phone] HIPPA compliant [place] [phone] HIPPA compliant [place] [phone] HIPPA compliant [place] [phone] HIPPA compliant [place] [phone] No bed availability  [facility] [place] [phone] HIPPA compliant [place] [phone] HIPPA compliant [place] [phone] No answer phone kept ringing no voicemail picked up  [place] [phone] HIPPA compliant [place] 1 [phone] No bed availability  [place] [phone] Requested info be sent to [place] [phone] Requested info be sent to [place] [phone] Requested info be sent to [place] [phone] Requested info be sent to [place] [phone] Requested info be sent to [place] [phone] Requested info be sent to [place] [phone] Requested info be sent to [place] [phone] Requested info be sent to [place] [phone] Requested info be sent to [place] [phone] Requested info be sent to [phone]   CSW faxed H&P and demographics to [employer].   Chalmers Guest. Guerry Bruin, MSW, LCSW, [place] 05/21/2020 4:40 PM

## 2020-05-21 NOTE — Progress Notes (Signed)
[facility] MD Progress Note  05/21/2020 10:12 AM Austin Gates  MRN:  [id number]   CC "I'm okay, going to go lay down."  Subjective:  60 year old male with schizoaffective disorder, bipolar type and moderate ID (IQ 72). No acute events overnight, medication complaint, attending to ADLs.   Patient continues on 1:1 for safety. Today patient appears somewhat sedated. He reports he is feeling fine. He continues to perseverate on discharge, and expresses willingness to be homeless. He is minimally receptive to explanation that disposition is up to guardian, and we are awaiting group home placement. He denies any suicidal ideations, homicidal ideations, visual hallucinations, or auditory hallucinations. He does not appear to be responding to internal stimuli. Group home search remains ongoing with out team as well as care coordinator- see social work notes for full details.   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 Hypertension   . Intellectual disability   . Obsessive-compulsive disorder   . Schizo-affective schizophrenia [facility])     Past Surgical History:  Procedure Laterality Date  . COLONOSCOPY WITH PROPOFOL N/A 12/28/2017   Procedure: COLONOSCOPY WITH PROPOFOL;  Surgeon: Jonathon Bellows, MD;  Location: [facility] ENDOSCOPY;  Service: Gastroenterology;  Laterality: N/A;  . HYDROCELE EXCISION Bilateral 02/22/2018   Procedure: bilateral HYDROCELECTOMY ADULT;  Surgeon: Cleon Gustin, MD;  Location: [place];  Service: Urology;  Laterality: Bilateral;  . INSERTION OF ILIAC STENT Left 02/22/2018   Procedure:  INSERTION LEFT SUPERIOR FEMORAL ARTERY USING 6MM X 5CM VIABHON STENT with mynx device closure on right femoral artery;  Surgeon: Waynetta Sandy, MD;  Location: [place];  Service: Vascular;  Laterality: Left;  . KNEE ARTHROSCOPY    . SCROTAL EXPLORATION N/A 02/22/2018   Procedure: SCROTUM EXPLORATION;  Surgeon: Cleon Gustin, MD;  Location: [place];  Service: Urology;  Laterality: N/A;  . UPPER EXTREMITY ANGIOGRAM Left 02/22/2018   Procedure: left lower EXTREMITY ANGIOGRAM;  Surgeon: Waynetta Sandy, MD;  Location: [facility] OR;  Service: Vascular;  Laterality: Left;   Family History:  Family History  Problem Relation Age of Onset  . Diabetes Mother    Family Psychiatric  History: See H&P Social History:  Social History   Substance and Sexual Activity  Alcohol Use No     Social History   Substance and Sexual Activity  Drug Use No    Social History   Socioeconomic History  . Marital status: Single    Spouse name: Not on file  . Number of children: Not on file  . Years of education: Not on file  . Highest education level: Not on file  Occupational History  . Occupation: Disabled  Tobacco Use  . Smoking status: Former Smoker    Types: Cigarettes  . Smokeless tobacco: Never Used  Vaping Use  . Vaping Use: Unknown  Substance and Sexual Activity  . Alcohol use: No  . Drug use: No  . Sexual activity: Never  Other Topics Concern  . Not on file  Social History Narrative   ** Merged History Encounter **       Social Determinants of Radio broadcast assistant  Strain: Not on file  Food Insecurity: Not on file  Transportation Needs: Not on file  Physical Activity: Not on file  Stress: Not on file  Social Connections: Not on file   Additional Social History:     Sleep: Fair  Appetite:  Good  Current Medications: Current Facility-Administered Medications  Medication Dose Route Frequency Provider Last Rate Last Admin  . acetaminophen (TYLENOL) tablet  650 mg  650 mg Oral Q6H PRN [name], John T, MD      . albuterol (VENTOLIN HFA) 108 (90 Base) MCG/ACT inhaler 2 puff  2 puff Inhalation Q4H PRN [name], John T, MD      . alum & mag hydroxide-simeth (MAALOX/MYLANTA) 200-200-20 MG/5ML suspension 30 mL  30 mL Oral Q4H PRN [name], [name], MD   30 mL at 05/20/20 2341  . benztropine (COGENTIN) tablet 0.5 mg  0.5 mg Oral BID [name], [name], MD   0.5 mg at 05/21/20 0808  . chlorproMAZINE (THORAZINE) injection 50 mg  50 mg Intramuscular TID PRN [name], John T, MD   50 mg at 05/19/20 1256   And  . diphenhydrAMINE (BENADRYL) injection 25 mg  25 mg Intramuscular TID PRN [name], [name], MD   25 mg at 05/19/20 1300  . clopidogrel (PLAVIX) tablet 75 mg  75 mg Oral Daily Salley Scarlet, MD   75 mg at 05/21/20 9562  . cloZAPine (CLOZARIL) tablet 200 mg  200 mg Oral Daily Salley Scarlet, MD   200 mg at 05/21/20 1308  . desmopressin (DDAVP) tablet 0.2 mg  0.2 mg Oral QHS Selina Cooley M, MD   0.2 mg at 05/20/20 2115  . haloperidol lactate (HALDOL) injection 5 mg  5 mg Intramuscular QID PRN Rulon Sera, MD   5 mg at 05/19/20 1440   And  . LORazepam (ATIVAN) injection 2 mg  2 mg Intramuscular Q4H PRN Rulon Sera, MD   2 mg at 05/19/20 1440   And  . diphenhydrAMINE (BENADRYL) injection 50 mg  50 mg Intramuscular Q4H PRN Rulon Sera, MD   50 mg at 05/18/20 1229  . docusate sodium (COLACE) capsule 100 mg  100 mg Oral BID PRN [name], [name], MD   100 mg at 05/17/20 0803  . feeding supplement (ENSURE ENLIVE / ENSURE PLUS) liquid 237 mL  237 mL Oral BID BM [name], [name], MD   237 mL at 05/21/20 0958  . ferrous sulfate tablet 325 mg  325 mg Oral Q breakfast [name], [name], MD   325 mg at 05/21/20 6578  . food thickener (THICK IT) powder   Oral PRN [name], John T, MD      . haloperidol (HALDOL) tablet 10 mg  10 mg Oral BID [name], [name], MD   10 mg at 05/21/20 4696  . haloperidol lactate (HALDOL) injection 5 mg  5 mg Intramuscular Once Rulon Sera, MD       . hydrocerin (EUCERIN) cream   Topical BID Salley Scarlet, MD   1 application at 29/52/84 [phone]  . LORazepam (ATIVAN) tablet 1 mg  1 mg Oral BID Salley Scarlet, MD   1 mg at 05/21/20 4010  . magnesium hydroxide (MILK OF MAGNESIA) suspension 30 mL  30 mL Oral Daily PRN [name], John T, MD      . melatonin tablet 5 mg  5 mg Oral QHS PRN [name], [name], MD   5 mg at 05/16/20 2113  . midodrine (PROAMATINE) tablet 10 mg  10  mg Oral TID AC [name], [name], MD   10 mg at 05/21/20 9622  . neomycin-bacitracin-polymyxin (NEOSPORIN) ointment   Topical BID Salley Scarlet, MD   Given at 05/21/20 [phone]  . nicotine (NICODERM CQ - dosed in mg/24 hours) patch 21 mg  21 mg Transdermal Daily [name], [name], MD   21 mg at 05/21/20 0808  . OLANZapine zydis (ZYPREXA) disintegrating tablet 10 mg  10 mg Oral QID PRN Rulon Sera, MD   10 mg at 05/19/20 1025  . polyethylene glycol (MIRALAX / GLYCOLAX) packet 17 g  17 g Oral Daily PRN [name], John T, MD      . traZODone (DESYREL) tablet 100 mg  100 mg Oral QHS PRN Caroline Sauger, NP   100 mg at 05/20/20 2115  . valproic acid (DEPAKENE) 250 MG/5ML solution 750 mg  750 mg Oral QID [name], John T, MD   750 mg at 05/21/20 0809  . zolpidem (AMBIEN) tablet 5 mg  5 mg Oral QHS Salley Scarlet, MD   5 mg at 05/20/20 2115    Lab Results:  Results for orders placed or performed during the hospital encounter of 05/07/20 (from the past 48 hour(s))  CBC with Differential/Platelet     Status: Abnormal   Collection Time: 05/20/20  8:04 AM  Result Value Ref Range   WBC 5.6 4.0 - 10.5 K/uL   RBC 4.31 4.22 - 5.81 MIL/uL   Hemoglobin 12.6 (L) 13.0 - 17.0 g/dL   HCT 39.5 39.0 - 52.0 %   MCV 91.6 80.0 - 100.0 fL   MCH 29.2 26.0 - 34.0 pg   MCHC 31.9 30.0 - 36.0 g/dL   RDW 15.3 11.5 - 15.5 %   Platelets 211 150 - 400 K/uL   nRBC 0.0 0.0 - 0.2 %   Neutrophils Relative % 73 %   Neutro Abs 4.0 1.7 - 7.7 K/uL   Lymphocytes Relative 17 %   Lymphs Abs 1.0 0.7 - 4.0 K/uL    Monocytes Relative 7 %   Monocytes Absolute 0.4 0.1 - 1.0 K/uL   Eosinophils Relative 2 %   Eosinophils Absolute 0.1 0.0 - 0.5 K/uL   Basophils Relative 0 %   Basophils Absolute 0.0 0.0 - 0.1 K/uL   Immature Granulocytes 1 %   Abs Immature Granulocytes 0.03 0.00 - 0.07 K/uL    Comment: Performed at [facility], [address] [place], [place] [address]    Blood Alcohol level:  Lab Results  Component Value Date   [facility] <10 04/30/2020   ETH <10 94/17/4081    Metabolic Disorder Labs: Lab Results  Component Value Date   HGBA1C 5.4 09/30/2016   MPG 108.28 09/30/2016   MPG 114 09/17/2016   Lab Results  Component Value Date   PROLACTIN 11.8 05/01/2020   Lab Results  Component Value Date   CHOL 135 09/30/2016   TRIG 104 02/25/2018   HDL 60 09/30/2016   CHOLHDL 2.3 09/30/2016   VLDL 12 09/30/2016   LDLCALC 63 09/30/2016    Physical Findings: AIMS: Facial and Oral Movements Muscles of Facial Expression: None, normal Lips and Perioral Area: None, normal Jaw: None, normal Tongue: Moderate,Extremity Movements Upper (arms, wrists, hands, fingers): None, normal Lower (legs, knees, ankles, toes): None, normal, Trunk Movements Neck, shoulders, hips: None, normal, Overall Severity Severity of abnormal movements (highest score from questions above): None, normal Incapacitation due to abnormal movements: None, normal Patient's awareness of abnormal movements (rate only patient's report): No  Awareness, Dental Status Current problems with teeth and/or dentures?: No Does patient usually wear dentures?: No  CIWA:    COWS:     Musculoskeletal: Strength & Muscle Tone: within normal limits Gait & Station: unsteady Patient leans: N/A  Psychiatric Specialty Exam:  Presentation  General Appearance: Casual  Eye Contact:Fair  Speech:Garbled  Speech Volume:Increased  Handedness:Right   Mood and Affect  Mood: Euthymic Affect: Congruent, bright,  smiling  Thought Process  Thought Processes:Goal Directed  Descriptions of Associations:Loose  Orientation:-- (Oriented to person and place)  Thought Content: Perseveration on discharge and food  History of Schizophrenia/Schizoaffective disorder:Yes Duration of Psychotic Symptoms:Greater than 6 months Hallucinations:Denies   Ideas of Reference:None  Suicidal Thoughts: Denies Homicidal Thoughts:Denies  Sensorium  Memory:Immediate Poor; Recent Poor; Remote Poor  Judgment:Impaired  Insight:Lacking   Executive Functions  Concentration:Poor  Attention Span:Poor  Recall:Poor  Fund of Knowledge:Poor  Language:Poor   Psychomotor Activity  Psychomotor Activity:Restless  Assets  Assets:Desire for Improvement; Financial Resources/Insurance; Social Support   Sleep  Sleep:Sleep: Good Number of Hours of Sleep: 8    Physical Exam: Physical Exam  ROS  Blood pressure 125/84, pulse 89, temperature 97.9 F (36.6 C), temperature source Oral, resp. rate 17, height 5\' 11"  (1.803 m), weight 75.8 kg, SpO2 100 %. Body mass index is 23.29 kg/m.   Treatment Plan Summary: Daily contact with patient to assess and evaluate symptoms and progress in treatment and Medication management   1-2)Schizoaffective disorder, bipolar type; moderate ID (IQ 48)- behavioral disturbance, agitation - Continue Clozaril 200 mg QHS, ANC 4144 on 3/30, weekly CBC for ANC checks. Will titrate back to home dose of 300 mg QHS as able.  -Valproic acid level 45 on Depakote 1000 mg BID, dose was increased to Depakote 750 mg four times daily on 3/27, check level again tomorrow morning - Continue Haldol 10 mg BID - Ativan 1 mg BID - Ambien 5 mg QHS  3) Mild asthma without complication- established problem, controlled - Albutrol PRN  4) Slow transit constipation- established problem, controlled  - Colace 100 mg BID, milk of magnesia PRN  5) Orthostatic hypotension- established problem,  controlled - Midodrine 10 mg TID before meals  6) Tobacco Use disorder - Nicoderm 21 mg patch   7) Unspecified dysphagia- patient chocked on food on 3/20 - Continue dysphagia 2 diet with nectar thick liquids. Continue to monitor for coughing or signs of aspiration while eating   05/22/2019: Psychiatric exam above reviewed and remains accurate. Assessment and plan above reviewed and updated.    Salley Scarlet, MD 05/21/2020, 10:12 AM

## 2020-05-22 LAB — VALPROIC ACID LEVEL: Valproic Acid Lvl: 58 ug/mL (ref 50.0–100.0)

## 2020-05-22 NOTE — Progress Notes (Addendum)
Pt on a 1:1 with staff per orders for safety. Pt was redirected from masturbating in the dayroom in front of peers to his room. Pt has poor insight and judgement, is childlike, easily redirected, requires frequent redirections. Pt is medication compliant, pt became agitated when redirected after the masturbation in public episode. Pt given PRN medication for agitation, which was effective. Pt's appetite is good. Pt denies suicidal and homicidal ideation, denies hallucinations, denies feelings of depression and anxiety, mood is elevated. Will continue to monitor pt per Q15 minute face checks and monitor for safety and progress.   After dinner pt began masturbating again in view of other peers and needed to be redirected to his room. MD notified.

## 2020-05-22 NOTE — BHH Counselor (Signed)
CSW contacted the following placements in an attempt to procure placement for patient.   [place]: [address], [place], [place], [address] [phone] Fax: [phone]- No bed availability  [place]: [address] [place], [place], [address] [phone] Fax: [phone] - No bed availability   [place]. [facility] SITE: [address], [place], [place], [address] [phone] Fax: [phone] Left VM  [place]: [address] [place], [place], [address] [phone] Fax: [phone]- Left VM  [place]: [address] [place], [place], [address] [phone] Fax: [phone]- Left VM  [place]: [place], [place], [place], [address] [phone] Fax: [phone]  Male beds only

## 2020-05-22 NOTE — Progress Notes (Signed)
[facility] MD Progress Note  05/22/2020 12:13 PM Austin Gates  MRN:  [id number]   CC "I'm okay, going to go lay down."  Subjective:  60 year old male with schizoaffective disorder, bipolar type and moderate ID (IQ 20). No acute events overnight, medication complaint, attending to ADLs.   Patient continues on 1:1 for safety. Today he was noted to be masturbating in the dayroom, and becoming agitated due to noise of other peers. He remains mostly redirectable by staff, but was also provided an oral PRN for agitation with good effect. He continues to remain fixated on discharge, and continues to ask to be let out on the streets. He continues to lack insight into fact that he has a legal guardian in charge of disposition decisions.  He denies any suicidal ideations, homicidal ideations, visual hallucinations, or auditory hallucinations. He does not appear to be responding to internal stimuli. Group home search remains ongoing with out team as well as care coordinator- see social work notes for full details.   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 Hypertension   . Intellectual disability   . Obsessive-compulsive disorder   . Schizo-affective schizophrenia [facility]     Past Surgical History:  Procedure Laterality Date  . COLONOSCOPY WITH PROPOFOL N/A 12/28/2017   Procedure: COLONOSCOPY WITH PROPOFOL;  Surgeon: Jonathon Bellows, MD;  Location: [facility] ENDOSCOPY;  Service: Gastroenterology;  Laterality: N/A;  . HYDROCELE EXCISION Bilateral 02/22/2018   Procedure: bilateral HYDROCELECTOMY ADULT;  Surgeon:  Cleon Gustin, MD;  Location: [place];  Service: Urology;  Laterality: Bilateral;  . INSERTION OF ILIAC STENT Left 02/22/2018   Procedure: INSERTION LEFT SUPERIOR FEMORAL ARTERY USING 6MM X 5CM VIABHON STENT with mynx device closure on right femoral artery;  Surgeon: Waynetta Sandy, MD;  Location: [place];  Service: Vascular;  Laterality: Left;  . KNEE ARTHROSCOPY    . SCROTAL EXPLORATION N/A 02/22/2018   Procedure: SCROTUM EXPLORATION;  Surgeon: Cleon Gustin, MD;  Location: [place];  Service: Urology;  Laterality: N/A;  . UPPER EXTREMITY ANGIOGRAM Left 02/22/2018   Procedure: left lower EXTREMITY ANGIOGRAM;  Surgeon: Waynetta Sandy, MD;  Location: [facility] OR;  Service: Vascular;  Laterality: Left;   Family History:  Family History  Problem Relation Age of Onset  . Diabetes Mother    Family Psychiatric  History: See H&P Social History:  Social History   Substance and Sexual Activity  Alcohol Use No     Social History   Substance and Sexual Activity  Drug Use No    Social History   Socioeconomic History  . Marital status: Single    Spouse name: Not on file  . Number of children: Not on file  . Years of education: Not on file  . Highest education level: Not on file  Occupational History  . Occupation: Disabled  Tobacco Use  . Smoking status: Former Smoker    Types: Cigarettes  . Smokeless tobacco: Never Used  Vaping Use  . Vaping Use: Unknown  Substance and Sexual Activity  . Alcohol use: No  . Drug use: No  . Sexual activity: Never  Other  Topics Concern  . Not on file  Social History Narrative   ** Merged History Encounter **       Social Determinants of Health   Financial Resource Strain: Not on file  Food Insecurity: Not on file  Transportation Needs: Not on file  Physical Activity: Not on file  Stress: Not on file  Social Connections: Not on file   Additional Social History:     Sleep: Fair  Appetite:  Good  Current  Medications: Current Facility-Administered Medications  Medication Dose Route Frequency Provider Last Rate Last Admin  . acetaminophen (TYLENOL) tablet 650 mg  650 mg Oral Q6H PRN [name], John T, MD      . albuterol (VENTOLIN HFA) 108 (90 Base) MCG/ACT inhaler 2 puff  2 puff Inhalation Q4H PRN [name], John T, MD      . alum & mag hydroxide-simeth (MAALOX/MYLANTA) 200-200-20 MG/5ML suspension 30 mL  30 mL Oral Q4H PRN [name], [name], MD   30 mL at 05/20/20 2341  . benztropine (COGENTIN) tablet 0.5 mg  0.5 mg Oral BID [name], [name], MD   0.5 mg at 05/22/20 2423  . chlorproMAZINE (THORAZINE) injection 50 mg  50 mg Intramuscular TID PRN [name], John T, MD   50 mg at 05/19/20 1256   And  . diphenhydrAMINE (BENADRYL) injection 25 mg  25 mg Intramuscular TID PRN [name], [name], MD   25 mg at 05/19/20 1300  . clopidogrel (PLAVIX) tablet 75 mg  75 mg Oral Daily Salley Scarlet, MD   75 mg at 05/22/20 5361  . cloZAPine (CLOZARIL) tablet 200 mg  200 mg Oral Daily Salley Scarlet, MD   200 mg at 05/22/20 4431  . desmopressin (DDAVP) tablet 0.2 mg  0.2 mg Oral QHS Selina Cooley M, MD   0.2 mg at 05/21/20 2104  . haloperidol lactate (HALDOL) injection 5 mg  5 mg Intramuscular QID PRN Rulon Sera, MD   5 mg at 05/19/20 1440   And  . LORazepam (ATIVAN) injection 2 mg  2 mg Intramuscular Q4H PRN Rulon Sera, MD   2 mg at 05/19/20 1440   And  . diphenhydrAMINE (BENADRYL) injection 50 mg  50 mg Intramuscular Q4H PRN Rulon Sera, MD   50 mg at 05/18/20 1229  . docusate sodium (COLACE) capsule 100 mg  100 mg Oral BID PRN [name], [name], MD   100 mg at 05/17/20 0803  . ferrous sulfate tablet 325 mg  325 mg Oral Q breakfast [name], [name], MD   325 mg at 05/22/20 5400  . food thickener (THICK IT) powder   Oral PRN [name], John T, MD      . haloperidol (HALDOL) tablet 10 mg  10 mg Oral BID [name], [name], MD   10 mg at 05/22/20 8676  . haloperidol lactate (HALDOL) injection 5 mg  5 mg Intramuscular  Once Rulon Sera, MD      . hydrocerin (EUCERIN) cream   Topical BID Salley Scarlet, MD   1 application at 19/50/93 [phone]  . LORazepam (ATIVAN) tablet 1 mg  1 mg Oral BID Salley Scarlet, MD   1 mg at 05/22/20 2458  . magnesium hydroxide (MILK OF MAGNESIA) suspension 30 mL  30 mL Oral Daily PRN [name], John T, MD      . melatonin tablet 5 mg  5 mg Oral QHS PRN [name], [name], MD   5 mg at 05/16/20 2113  . midodrine (PROAMATINE) tablet 10  mg  10 mg Oral TID AC [name], [name], MD   10 mg at 05/22/20 1200  . neomycin-bacitracin-polymyxin (NEOSPORIN) ointment   Topical BID Salley Scarlet, MD   1 application at 44/96/75 0827  . nicotine (NICODERM CQ - dosed in mg/24 hours) patch 21 mg  21 mg Transdermal Daily [name], [name], MD   21 mg at 05/22/20 1200  . OLANZapine zydis (ZYPREXA) disintegrating tablet 10 mg  10 mg Oral QID PRN Rulon Sera, MD   10 mg at 05/22/20 9163  . polyethylene glycol (MIRALAX / GLYCOLAX) packet 17 g  17 g Oral Daily PRN [name], John T, MD      . traZODone (DESYREL) tablet 100 mg  100 mg Oral QHS PRN Caroline Sauger, NP   100 mg at 05/21/20 2104  . valproic acid (DEPAKENE) 250 MG/5ML solution 750 mg  750 mg Oral QID [name], John T, MD   750 mg at 05/22/20 1200  . zolpidem (AMBIEN) tablet 5 mg  5 mg Oral QHS Salley Scarlet, MD   5 mg at 05/21/20 2104    Lab Results:  Results for orders placed or performed during the hospital encounter of 05/07/20 (from the past 48 hour(s))  Valproic acid level     Status: None   Collection Time: 05/22/20  6:39 AM  Result Value Ref Range   Valproic Acid Lvl 58 50.0 - 100.0 ug/mL    Comment: Performed at [facility], [address], [place], [place] [address]    Blood Alcohol level:  Lab Results  Component Value Date   [facility] <10 04/30/2020   ETH <10 99/35/7017    Metabolic Disorder Labs: Lab Results  Component Value Date   HGBA1C 5.4 09/30/2016   MPG 108.28 09/30/2016   MPG 114 09/17/2016   Lab Results   Component Value Date   PROLACTIN 11.8 05/01/2020   Lab Results  Component Value Date   CHOL 135 09/30/2016   TRIG 104 02/25/2018   HDL 60 09/30/2016   CHOLHDL 2.3 09/30/2016   VLDL 12 09/30/2016   LDLCALC 63 09/30/2016    Physical Findings: AIMS: Facial and Oral Movements Muscles of Facial Expression: None, normal Lips and Perioral Area: None, normal Jaw: None, normal Tongue: Moderate,Extremity Movements Upper (arms, wrists, hands, fingers): None, normal Lower (legs, knees, ankles, toes): None, normal, Trunk Movements Neck, shoulders, hips: None, normal, Overall Severity Severity of abnormal movements (highest score from questions above): None, normal Incapacitation due to abnormal movements: None, normal Patient's awareness of abnormal movements (rate only patient's report): No Awareness, Dental Status Current problems with teeth and/or dentures?: No Does patient usually wear dentures?: No  CIWA:    COWS:     Musculoskeletal: Strength & Muscle Tone: within normal limits Gait & Station: unsteady Patient leans: N/A  Psychiatric Specialty Exam:  Presentation  General Appearance: Casual  Eye Contact:Fair  Speech:Garbled  Speech Volume:Increased  Handedness:Right   Mood and Affect  Mood: Euthymic Affect: Congruent, bright, smiling  Thought Process  Thought Processes:Goal Directed  Descriptions of Associations:Loose  Orientation:-- (Oriented to person and place)  Thought Content: Perseveration on discharge and food  History of Schizophrenia/Schizoaffective disorder:Yes Duration of Psychotic Symptoms:Greater than 6 months Hallucinations:Denies   Ideas of Reference:None  Suicidal Thoughts: Denies Homicidal Thoughts:Denies  Sensorium  Memory:Immediate Poor; Recent Poor; Remote Poor  Judgment:Impaired  Insight:Lacking   Executive Functions  Concentration:Poor  Attention Span:Poor  Recall:Poor  Fund of  Knowledge:Poor  Language:Poor   Psychomotor Activity  Psychomotor Activity:Restless  Assets  Assets:Desire for Improvement; Financial Resources/Insurance; Social Support   Sleep  Sleep:Sleep: Fair Number of Hours of Sleep: 6.25    Physical Exam: Physical Exam  ROS  Blood pressure 122/88, pulse (!) 107, temperature 97.9 F (36.6 C), temperature source Oral, resp. rate 18, height 5\' 11"  (1.803 m), weight 75.8 kg, SpO2 99 %. Body mass index is 23.29 kg/m.   Treatment Plan Summary: Daily contact with patient to assess and evaluate symptoms and progress in treatment and Medication management   1-2)Schizoaffective disorder, bipolar type; moderate ID (IQ 48)- behavioral disturbance, agitation - Continue Clozaril 200 mg QHS, ANC 4144 on 3/30, weekly CBC for ANC checks.  -Valproic acid level 58 on Depakote 750 mg four times daily. Patient appears to be at his baseline so will not increase despite being mildly subtherapeutic  - Continue Haldol 10 mg BID - Ativan 1 mg BID - Ambien 5 mg QHS  3) Mild asthma without complication- established problem, controlled - Albutrol PRN  4) Slow transit constipation- established problem, controlled  - Colace 100 mg BID, milk of magnesia PRN  5) Orthostatic hypotension- established problem, controlled - Midodrine 10 mg TID before meals  6) Tobacco Use disorder - Nicoderm 21 mg patch   7) Unspecified dysphagia- patient chocked on food on 3/20 - Continue dysphagia 2 diet with nectar thick liquids. Continue to monitor for coughing or signs of aspiration while eating   05/22/20: Psychiatric exam above reviewed and remains accurate. Assessment and plan above reviewed and updated.     Salley Scarlet, MD 05/22/2020, 12:13 PM

## 2020-05-22 NOTE — Progress Notes (Signed)
Recreation Therapy Notes    Date: 05/22/2020  Time: 9:30 am   Location: Court yard     Behavioral response: N/A   Intervention Topic: Social- Skills    Discussion/Intervention: Patient did not attend group.   Clinical Observations/Feedback:  Patient did not attend group.   Esteban Kobashigawa LRT/CTRS        Kaiden Dardis 05/22/2020 11:19 AM

## 2020-05-22 NOTE — BHH Group Notes (Signed)
LCSW Group Therapy Note  05/22/2020 2:12 PM  Type of Therapy and Topic:  Group Therapy:  Feelings around Relapse and Recovery  Participation Level:  Did Not Attend   Description of Group:    Patients in this group will discuss emotions they experience before and after a relapse. They will process how experiencing these feelings, or avoidance of experiencing them, relates to having a relapse. Facilitator will guide patients to explore emotions they have related to recovery. Patients will be encouraged to process which emotions are more powerful. They will be guided to discuss the emotional reaction significant others in their lives may have to their relapse or recovery. Patients will be assisted in exploring ways to respond to the emotions of others without this contributing to a relapse.  Therapeutic Goals: 1. Patient will identify two or more emotions that lead to a relapse for them 2. Patient will identify two emotions that result when they relapse 3. Patient will identify two emotions related to recovery 4. Patient will demonstrate ability to communicate their needs through discussion and/or role plays   Summary of Patient Progress: X  Therapeutic Modalities:   Cognitive Behavioral Therapy Solution-Focused Therapy Assertiveness Training Relapse Prevention Therapy   Hedy Camara R. Guerry Bruin, MSW, [place], [place] 05/22/2020 2:12 PM

## 2020-05-22 NOTE — BHH Counselor (Signed)
CSW contacted the following for potential bed placement:  [place] [phone] [email or web address] .[place] HIPPA compliant voicemail  [place] [phone] [email or web address] .[place] HIPPA compliant voicemail  [place] [phone] [email or web address] [place] No response, phone just rang, no voicemail picked up  [place] [phone] [email or web address] [place] HIPPA compliant voicemail  Katy Apo P. [address] of [place]  [phone] [email or web address] .[place] No bed availability  [place] [phone] [email or web address] .[place] Informed needed to call owner at [phone]. Requested information be emailed to him  [place] [address] [place] [phone] [email or web address] [place] Contact information left for a call back  [facility] [place] [phone] [email or web address] [place] HIPPA compliant voicemail  left  [place] [phone] [email or web address] .[place] Informed needed to call [phone]. Requested information be emailed to him  [place] [phone]   [email or web address] [place] Asked to send information to [email or web address]  [place] [phone]   [email or web address] [place] No response, phone just rang, no voicemail picked up  [place]   [phone]   [email or web address] .[place] Informed needed to call [phone]. Requested information be emailed to him  [place] [phone] [email or web address] .[place] Contacted and stated they would follow up with care coordinator  [place] [phone] [email or web address] .[place] Contacted and stated they would follow up with care coordinator  [place] [phone]   [email or web address] [place] No voicemail set up  [place] [phone]   [email or web address] [place] No voicemail set up   [employer]. Guerry Bruin, MSW, [place], [place] 05/22/2020 4:13 PM

## 2020-05-22 NOTE — BHH Counselor (Signed)
CSW sent information to [facility] [place] per requests.   CSW was informed that pt information is under review at [facility] from note on 05/21/20.   CSW received voicemail that [place] does not have any availability.  Chalmers Guest. Guerry Bruin, MSW, [place], [place] 05/22/2020 4:14 PM

## 2020-05-22 NOTE — Progress Notes (Signed)
Patient on 1:1 for safety with a sitter present. Pt is asleep and without complaint at this time. Pt remains safe on the unit.

## 2020-05-23 MED ORDER — HYDROXYZINE HCL 25 MG PO TABS
25.0000 mg | ORAL_TABLET | Freq: Once | ORAL | Status: AC
  Administered 2020-05-23: 25 mg via ORAL
  Filled 2020-05-23: qty 1

## 2020-05-23 NOTE — Progress Notes (Signed)
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 male patient with history of schizoaffective disorder, IDD, admitted with with agitation and difficulty controlling his behavior.   Interval History Patient was seen today for re-evaluation.  Nursing reports no events overnight. Patient has been medication compliant. Patient needs a lot of redirections, although he was not agitated or aggressive.  Subjective:  Patient seem several times due to him wanting to talk several times. He remains focused on discharge and keeps asking to let him go, states he can stay on the streets, then with friends, then with family member. We discussed that he has a guardian and all decisions will be made together with his guardian. Patient explained that we are working on his placement. Patient denies any complaints. Reports good mood. He denies suicidal/homicidal ideations. He denies auditory/visual hallucinations. The patient reports no side effects from medications.    Labs: no new results for review. VPA yesterday 58. Last ANC 4.0 from 3/30; next CBC with diff scheduled on 05/27/20.  Total Time spent with patient: 30 minutes  Past Psychiatric History: see H&P  Past Medical History:  Past Medical History:  Diagnosis Date  . Hypertension   . Intellectual disability   . Obsessive-compulsive disorder   . Schizo-affective schizophrenia [facility])     Past Surgical History:  Procedure Laterality Date  . COLONOSCOPY WITH PROPOFOL N/A 12/28/2017   Procedure: COLONOSCOPY WITH PROPOFOL;  Surgeon: Jonathon Bellows, MD;  Location: [facility] ENDOSCOPY;  Service: Gastroenterology;  Laterality: N/A;   . HYDROCELE EXCISION Bilateral 02/22/2018   Procedure: bilateral HYDROCELECTOMY ADULT;  Surgeon: Cleon Gustin, MD;  Location: [place];  Service: Urology;  Laterality: Bilateral;  . INSERTION OF ILIAC STENT Left 02/22/2018   Procedure: INSERTION LEFT SUPERIOR FEMORAL ARTERY USING 6MM X 5CM VIABHON STENT with mynx device closure on right femoral artery;  Surgeon: Waynetta Sandy, MD;  Location: [place];  Service: Vascular;  Laterality: Left;  . KNEE ARTHROSCOPY    . SCROTAL EXPLORATION N/A 02/22/2018   Procedure: SCROTUM EXPLORATION;  Surgeon: Cleon Gustin, MD;  Location: [place];  Service: Urology;  Laterality: N/A;  . UPPER EXTREMITY ANGIOGRAM Left 02/22/2018   Procedure: left lower EXTREMITY ANGIOGRAM;  Surgeon: Waynetta Sandy, MD;  Location: [facility] OR;  Service: Vascular;  Laterality: Left;   Family History:  Family History  Problem Relation Age of Onset  . Diabetes Mother    Family Psychiatric  History: N/A Social History:  Social History   Substance and Sexual Activity  Alcohol Use No     Social History   Substance and Sexual Activity  Drug Use No    Social History   Socioeconomic History  . Marital status: Single    Spouse name: Not on file  . Number of children: Not on file  . Years of education: Not on file  . Highest education level: Not on file  Occupational History  . Occupation: Disabled  Tobacco Use  . Smoking status: Former Smoker    Types: Cigarettes  . Smokeless tobacco: Never Used  Vaping Use  . Vaping Use: Unknown  Substance and Sexual Activity  . Alcohol use: No  . Drug use:  No  . Sexual activity: Never  Other Topics Concern  . Not on file  Social History Narrative   ** Merged History Encounter **       Social Determinants of Health   Financial Resource Strain: Not on file  Food Insecurity: Not on file  Transportation Needs: Not on file  Physical Activity: Not on file  Stress: Not on file  Social Connections: Not on file    Additional Social History:                         Sleep: Good  Appetite:  Good  Current Medications: Current Facility-Administered Medications  Medication Dose Route Frequency Provider Last Rate Last Admin  . acetaminophen (TYLENOL) tablet 650 mg  650 mg Oral Q6H PRN [name], John T, MD      . albuterol (VENTOLIN HFA) 108 (90 Base) MCG/ACT inhaler 2 puff  2 puff Inhalation Q4H PRN [name], John T, MD      . alum & mag hydroxide-simeth (MAALOX/MYLANTA) 200-200-20 MG/5ML suspension 30 mL  30 mL Oral Q4H PRN [name], [name], MD   30 mL at 05/22/20 1932  . benztropine (COGENTIN) tablet 0.5 mg  0.5 mg Oral BID [name], [name], MD   0.5 mg at 05/23/20 0824  . chlorproMAZINE (THORAZINE) injection 50 mg  50 mg Intramuscular TID PRN [name], John T, MD   50 mg at 05/19/20 1256   And  . diphenhydrAMINE (BENADRYL) injection 25 mg  25 mg Intramuscular TID PRN [name], [name], MD   25 mg at 05/19/20 1300  . clopidogrel (PLAVIX) tablet 75 mg  75 mg Oral Daily Salley Scarlet, MD   75 mg at 05/23/20 0830  . cloZAPine (CLOZARIL) tablet 200 mg  200 mg Oral Daily Salley Scarlet, MD   200 mg at 05/23/20 0109  . desmopressin (DDAVP) tablet 0.2 mg  0.2 mg Oral QHS Selina Cooley M, MD   0.2 mg at 05/21/20 2104  . haloperidol lactate (HALDOL) injection 5 mg  5 mg Intramuscular QID PRN Rulon Sera, MD   5 mg at 05/19/20 1440   And  . LORazepam (ATIVAN) injection 2 mg  2 mg Intramuscular Q4H PRN Rulon Sera, MD   2 mg at 05/19/20 1440   And  . diphenhydrAMINE (BENADRYL) injection 50 mg  50 mg Intramuscular Q4H PRN Rulon Sera, MD   50 mg at 05/18/20 1229  . docusate sodium (COLACE) capsule 100 mg  100 mg Oral BID PRN [name], [name], MD   100 mg at 05/17/20 0803  . ferrous sulfate tablet 325 mg  325 mg Oral Q breakfast [name], [name], MD   325 mg at 05/23/20 0824  . food thickener (THICK IT) powder   Oral PRN [name], John T, MD      . haloperidol (HALDOL) tablet 10 mg  10 mg Oral BID  [name], [name], MD   10 mg at 05/23/20 3235  . haloperidol lactate (HALDOL) injection 5 mg  5 mg Intramuscular Once Rulon Sera, MD      . hydrocerin (EUCERIN) cream   Topical BID Salley Scarlet, MD   Given at 05/23/20 0825  . LORazepam (ATIVAN) tablet 1 mg  1 mg Oral BID Salley Scarlet, MD   1 mg at 05/23/20 5732  . magnesium hydroxide (MILK OF MAGNESIA) suspension 30 mL  30 mL Oral Daily PRN [name], [name], MD      .  melatonin tablet 5 mg  5 mg Oral QHS PRN [name], [name], MD   5 mg at 05/16/20 2113  . midodrine (PROAMATINE) tablet 10 mg  10 mg Oral TID AC [name], [name], MD   10 mg at 05/23/20 0824  . neomycin-bacitracin-polymyxin (NEOSPORIN) ointment   Topical BID Salley Scarlet, MD   Given at 05/23/20 [phone]  . nicotine (NICODERM CQ - dosed in mg/24 hours) patch 21 mg  21 mg Transdermal Daily [name], [name], MD   21 mg at 05/23/20 0820  . OLANZapine zydis (ZYPREXA) disintegrating tablet 10 mg  10 mg Oral QID PRN Rulon Sera, MD   10 mg at 05/22/20 8295  . polyethylene glycol (MIRALAX / GLYCOLAX) packet 17 g  17 g Oral Daily PRN [name], John T, MD      . traZODone (DESYREL) tablet 100 mg  100 mg Oral QHS PRN Caroline Sauger, NP   100 mg at 05/21/20 2104  . valproic acid (DEPAKENE) 250 MG/5ML solution 750 mg  750 mg Oral QID [name], John T, MD   750 mg at 05/23/20 0829  . zolpidem (AMBIEN) tablet 5 mg  5 mg Oral QHS Salley Scarlet, MD   5 mg at 05/22/20 2012    Lab Results:  Results for orders placed or performed during the hospital encounter of 05/07/20 (from the past 48 hour(s))  Valproic acid level     Status: None   Collection Time: 05/22/20  6:39 AM  Result Value Ref Range   Valproic Acid Lvl 58 50.0 - 100.0 ug/mL    Comment: Performed at [facility], [address] [place], [place] [address]    Blood Alcohol level:  Lab Results  Component Value Date   [facility] <10 04/30/2020   ETH <10 86/57/8469    Metabolic Disorder Labs: Lab Results  Component  Value Date   HGBA1C 5.4 09/30/2016   MPG 108.28 09/30/2016   MPG 114 09/17/2016   Lab Results  Component Value Date   PROLACTIN 11.8 05/01/2020   Lab Results  Component Value Date   CHOL 135 09/30/2016   TRIG 104 02/25/2018   HDL 60 09/30/2016   CHOLHDL 2.3 09/30/2016   VLDL 12 09/30/2016   LDLCALC 63 09/30/2016    Physical Findings: AIMS: Facial and Oral Movements Muscles of Facial Expression: None, normal Lips and Perioral Area: None, normal Jaw: None, normal Tongue: Moderate,Extremity Movements Upper (arms, wrists, hands, fingers): None, normal Lower (legs, knees, ankles, toes): None, normal, Trunk Movements Neck, shoulders, hips: None, normal, Overall Severity Severity of abnormal movements (highest score from questions above): None, normal Incapacitation due to abnormal movements: None, normal Patient's awareness of abnormal movements (rate only patient's report): No Awareness, Dental Status Current problems with teeth and/or dentures?: No Does patient usually wear dentures?: No  CIWA:    COWS:     Musculoskeletal: Strength & Muscle Tone: within normal limits Gait & Station: unsteady Patient leans: N/A  Psychiatric Specialty Exam:  Presentation  General Appearance: Casual  Eye Contact:Fair  Speech:Garbled  Speech Volume:Increased  Handedness:Right   Mood and Affect  Mood:Euthymic  Affect:Constricted   Thought Process  Thought Processes:Goal Directed  Descriptions of Associations:Loose  Orientation:-- (Oriented to person and place)  Thought Content:Perseveration  History of Schizophrenia/Schizoaffective disorder:No data recorded Duration of Psychotic Symptoms:No data recorded Hallucinations:No data recorded Ideas of Reference:None  Suicidal Thoughts:No data recorded Homicidal Thoughts:No data recorded  Sensorium  Memory:Immediate Poor; Recent Poor; Remote Poor  Judgment:Impaired  Insight:Lacking  Executive Functions   Concentration:Poor  Attention Span:Poor  Recall:Poor  [employer] of Knowledge:Poor  Language:Poor   Psychomotor Activity  Psychomotor Activity:No data recorded  Assets  Assets:Desire for Improvement; Financial Resources/Insurance; Social Support   Sleep  Sleep:No data recorded   Physical Exam: Physical Exam ROS Blood pressure 107/83, pulse 89, temperature 97.7 F (36.5 C), temperature source Oral, resp. rate 18, height 5\' 11"  (1.803 m), weight 75.8 kg, SpO2 99 %. Body mass index is 23.29 kg/m.   Treatment Plan Summary: Daily contact with patient to assess and evaluate symptoms and progress in treatment and Medication management  Patient is a 60 year old male with the above-stated past psychiatric history who is seen in follow-up.  Chart reviewed. Patient discussed with nursing. Patient remains at his mental and behavioral baseline.   Plan: -continue inpatient psych admission; 15-minute checks or 1:1 when needed; daily contact with patient to assess and evaluate symptoms and progress in treatment; psychoeducation.  -medications:  Schizoaffective disorder, bipolar type; moderate ID (IQ 48)- behavioral disturbance, agitation - Continue Clozaril 200 mg QHS, ANC 4000 on 4/1, weekly CBC for ANC checks, next due 4/6.  -Valproic acid level 58 on Depakote 750 mg four times daily. Patient appears to be at his baseline so will not increase despite being mildly subtherapeutic  - Continue Haldol 10 mg BID - Ativan 1 mg BID - Ambien 5 mg QHS   Mild asthma without complication- established problem, controlled - Albutrol PRN  Slow transit constipation- established problem, controlled  - Colace 100 mg BID, milk of magnesia PRN  Orthostatic hypotension- established problem, controlled - Midodrine 10 mg TID before meals  Tobacco Use disorder - Nicoderm 21 mg patch  -continue PRN medications.  acetaminophen, albuterol, alum & mag hydroxide-simeth, chlorproMAZINE (THORAZINE)  injection **AND** diphenhydrAMINE, haloperidol lactate **AND** LORazepam **AND** diphenhydrAMINE, docusate sodium, food thickener, magnesium hydroxide, melatonin, OLANZapine zydis, polyethylene glycol, traZODone  -Pertinent Labs: no new labs ordered today; ANC 4000 on 4/1, weekly CBC for ANC checks, next due 4/6.    -Consults: No new consults placed since yesterday    -Disposition: patient is awaiting placement to a facility. All necessary aftercare will be arranged prior to discharge.  -  I certify that the patient does need, on a daily basis, active treatment furnished directly by or requiring the supervision of inpatient psychiatric facility personnel.    Larita Fife, MD 05/23/2020, 9:01 AM

## 2020-05-23 NOTE — Tx Team (Cosign Needed)
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 acetaminophen (TYLENOL) tablet 650 mg  650 mg Oral Q6H PRN [name], John T, MD      . albuterol (VENTOLIN HFA) 108 (90 Base) MCG/ACT inhaler 2 puff  2 puff Inhalation Q4H PRN [name], John T, MD      . alum & mag hydroxide-simeth (MAALOX/MYLANTA) 200-200-20 MG/5ML suspension 30 mL  30 mL Oral Q4H PRN [name], [name], MD   30 mL at 05/22/20 1932  . benztropine (COGENTIN) tablet 0.5 mg  0.5 mg Oral BID [name], [name], MD   0.5 mg at 05/23/20 0824  . chlorproMAZINE (THORAZINE) injection 50 mg  50 mg Intramuscular TID PRN [name], John T, MD   50 mg at 05/19/20 1256   And  . diphenhydrAMINE (BENADRYL) injection 25 mg  25 mg Intramuscular TID PRN [name], [name], MD   25 mg at 05/19/20 1300  . clopidogrel (PLAVIX) tablet 75 mg  75 mg Oral Daily Salley Scarlet, MD   75 mg at 05/23/20 0830  . cloZAPine (CLOZARIL) tablet 200 mg  200 mg Oral Daily Salley Scarlet, MD   200 mg at 05/23/20 2423  . desmopressin (DDAVP) tablet 0.2 mg  0.2 mg Oral QHS Selina Cooley M, MD   0.2 mg at 05/21/20 2104  . haloperidol lactate (HALDOL) injection 5 mg  5 mg Intramuscular QID PRN Rulon Sera, MD   5 mg at 05/19/20 1440   And  . LORazepam (ATIVAN) injection 2 mg  2 mg Intramuscular Q4H PRN Rulon Sera, MD   2 mg at 05/19/20 1440   And  . diphenhydrAMINE (BENADRYL) injection 50 mg  50 mg Intramuscular Q4H PRN Rulon Sera, MD   50 mg at  05/18/20 1229  . docusate sodium (COLACE) capsule 100 mg  100 mg Oral BID PRN [name], [name], MD   100 mg at 05/17/20 0803  . ferrous sulfate tablet 325 mg  325 mg Oral Q breakfast [name], [name], MD   325 mg at 05/23/20 0824  . food thickener (THICK IT) powder   Oral PRN [name], John T, MD      . haloperidol (HALDOL) tablet 10 mg  10 mg Oral BID [name], [name], MD   10 mg at 05/23/20 5361  . haloperidol lactate (HALDOL) injection 5 mg  5 mg Intramuscular Once Rulon Sera, MD      . hydrocerin (EUCERIN) cream   Topical BID Salley Scarlet, MD   Given at 05/23/20 0825  . LORazepam (ATIVAN) tablet 1 mg  1 mg Oral BID Salley Scarlet, MD   1 mg at 05/23/20 4431  . magnesium hydroxide (MILK OF MAGNESIA) suspension 30 mL  30 mL Oral Daily PRN [name], John T, MD      . melatonin tablet 5 mg  5 mg Oral QHS PRN [name], [name], MD   5 mg at 05/16/20 2113  . midodrine (PROAMATINE) tablet 10 mg  10 mg Oral TID AC [name], [name], MD   10  mg at 05/23/20 0824  . neomycin-bacitracin-polymyxin (NEOSPORIN) ointment   Topical BID Salley Scarlet, MD   Given at 05/23/20 [id number]  . nicotine (NICODERM CQ - dosed in mg/24 hours) patch 21 mg  21 mg Transdermal Daily [name], [name], MD   21 mg at 05/23/20 0820  . OLANZapine zydis (ZYPREXA) disintegrating tablet 10 mg  10 mg Oral QID PRN Rulon Sera, MD   10 mg at 05/22/20 5170  . polyethylene glycol (MIRALAX / GLYCOLAX) packet 17 g  17 g Oral Daily PRN [name], John T, MD      . traZODone (DESYREL) tablet 100 mg  100 mg Oral QHS PRN Caroline Sauger, NP   100 mg at 05/21/20 2104  . valproic acid (DEPAKENE) 250 MG/5ML solution 750 mg  750 mg Oral QID [name], John T, MD   750 mg at 05/23/20 0829  . zolpidem (AMBIEN) tablet 5 mg  5 mg Oral QHS Salley Scarlet, MD   5 mg at 05/22/20 2012   PTA Medications: Medications Prior to Admission  Medication Sig Dispense Refill Last Dose  . benztropine (COGENTIN) 0.5 MG tablet Take 0.5 mg by mouth 2 (two) times  daily.   05/07/2020 at Unknown time  . divalproex (DEPAKOTE) 500 MG DR tablet Take 500 mg by mouth 3 (three) times daily.     . Ensure (ENSURE) Take 237 mLs by mouth in the morning and at bedtime.     . ferrous sulfate 325 (65 FE) MG tablet Take 325 mg by mouth daily with breakfast.     . food thickener (THICK IT) POWD Take by mouth in the morning, at noon, and at bedtime.     Marland Kitchen lactulose (CHRONULAC) 10 GM/15ML solution Take 20 g by mouth daily.     . melatonin 3 MG TABS tablet Take 3 mg by mouth at bedtime as needed (sleep).     . midodrine (PROAMATINE) 10 MG tablet Take 10 mg by mouth 3 (three) times daily.       Patient Stressors: Health problems Marital or family conflict  Patient Strengths: Supportive family/friends  Treatment Modalities: Medication Management, Group therapy, Case management,  1 to 1 session with clinician, Psychoeducation, Recreational therapy.   Physician Treatment Plan for Primary Diagnosis: Schizoaffective disorder, bipolar type ([place]) Long Term Goal(s): Improvement in symptoms so as ready for discharge Improvement in symptoms so as ready for discharge   Short Term Goals: Ability to identify changes in lifestyle to reduce recurrence of condition will improve Ability to verbalize feelings will improve Ability to disclose and discuss suicidal ideas Ability to demonstrate self-control will improve Ability to identify and develop effective coping behaviors will improve Ability to maintain clinical measurements within normal limits will improve Compliance with prescribed medications will improve Ability to identify changes in lifestyle to reduce recurrence of condition will improve Ability to verbalize feelings will improve Ability to disclose and discuss suicidal ideas Ability to demonstrate self-control will improve Ability to identify and develop effective coping behaviors will improve Ability to maintain clinical measurements within normal limits will  improve Compliance with prescribed medications will improve Ability to identify triggers associated with substance abuse/mental health issues will improve  Medication Management: Evaluate patient's response, side effects, and tolerance of medication regimen.  Therapeutic Interventions: 1 to 1 sessions, Unit Group sessions and Medication administration.  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   Hypotension   Mild asthma without complication   Slow transit constipation  Long Term Goal(s): Improvement in symptoms so as ready for discharge Improvement in symptoms so as ready for discharge   Short Term Goals: Ability to identify changes in lifestyle to reduce recurrence of condition will improve Ability to verbalize feelings will improve Ability to disclose and discuss suicidal ideas Ability to demonstrate self-control will improve Ability to identify and develop effective coping behaviors will improve Ability to maintain clinical measurements within normal limits will improve Compliance with prescribed medications will improve Ability to identify changes in lifestyle to reduce recurrence of condition will improve Ability to verbalize feelings will improve Ability to disclose and discuss suicidal ideas Ability to demonstrate self-control will improve Ability to identify and develop effective coping behaviors will improve Ability to maintain clinical measurements within normal limits will improve Compliance with prescribed medications will improve Ability to identify triggers associated with substance abuse/mental health issues will improve     Medication Management: Evaluate patient's response, side effects, and tolerance of medication regimen.  Therapeutic Interventions: 1 to 1  sessions, Unit Group sessions and Medication administration.  Evaluation of Outcomes: Not Progressing   RN Treatment Plan for Primary Diagnosis: Schizoaffective disorder, bipolar type ([place]) Long Term Goal(s): Knowledge of disease and therapeutic regimen to maintain health will improve  Short Term Goals: Ability to remain free from injury will improve, Ability to verbalize frustration and anger appropriately will improve, Ability to demonstrate self-control, Ability to participate in decision making will improve, Ability to verbalize feelings will improve, Ability to identify and develop effective coping behaviors will improve and Compliance with prescribed medications will improve  Medication Management: RN will administer medications as ordered by provider, will assess and evaluate patient's response and provide education to patient for prescribed medication. RN will report any adverse and/or side effects to prescribing provider.  Therapeutic Interventions: 1 on 1 counseling sessions, Psychoeducation, Medication administration, Evaluate responses to treatment, Monitor vital signs and CBGs as ordered, Perform/monitor CIWA, COWS, AIMS and Fall Risk screenings as ordered, Perform wound care treatments as ordered.  Evaluation of Outcomes: Not Progressing   LCSW Treatment Plan for Primary Diagnosis: Schizoaffective disorder, bipolar type ([place]) Long Term Goal(s): Safe transition to appropriate next level of care at discharge, Engage patient in therapeutic group addressing interpersonal concerns.  Short Term Goals: Engage patient in aftercare planning with referrals and resources, Increase social support, Increase ability to appropriately verbalize feelings, Increase emotional regulation, Facilitate acceptance of mental health diagnosis and concerns, Identify triggers associated with mental health/substance abuse issues and Increase skills for wellness and recovery  Therapeutic Interventions: Assess  for all discharge needs, 1 to 1 time with Social worker, Explore available resources and support systems, Assess for adequacy in community support network, Educate family and significant other(s) on suicide prevention, Complete Psychosocial Assessment, Interpersonal group therapy.  Evaluation of Outcomes: Not Progressing   Progress in Treatment: Attending groups: No. Participating in groups: No. Taking medication as prescribed: Yes. Toleration medication: Yes. Family/Significant other contact made: Yes, individual(s) contacted:  [name], Guardian/Sister Patient understands diagnosis: No. Discussing patient identified problems/goals with staff: Yes. Medical problems stabilized or resolved: Yes. Denies suicidal/homicidal ideation: Yes. Issues/concerns per patient self-inventory: No. Other: None  New problem(s) identified: No, Describe:  None  New Short Term/Long Term Goal(s): Elimination of symptoms of psychosis, medication management for mood stabilization; development of comprehensive mental wellness plan. Update 05/13/20: No changes at this  time. Update 05/18/20: No changes at this time. Update 05/23/20: No changes at this time.   Patient Goals: "I want to go home." Update 05/13/20: No changes at this time. Update 05/18/20: No changes at this time. Update 05/23/20: No changes at this time.    Discharge Plan or Barriers: CSW will assist with development of an appropriate discharge/aftercare plan. Patient will need placement and has a guardian. Update 05/13/20: Patients behavior has become increasingly aggressive. Referral has been made to [facility] and patient is on waiting list for [facility]. Update 05/18/20: Patient remains aggressive and isolated to back hall. Patient is on the wait list for [facility]. Update 05/23/20: Patient is no longer isolated to the back hallway.   Reason for Continuation of Hospitalization: Aggression Medical Issues Medication stabilization  Estimated Length  of Stay:  Attendees: Patient: 05/23/2020 11:04 AM  Physician: Dr. Danella Sensing, MD  05/23/2020 11:04 AM  Nursing:  05/23/2020 11:04 AM  RN Care Manager: 05/23/2020 11:04 AM  Social Worker: Raina Mina, [place] 05/23/2020 11:04 AM  Recreational Therapist:  05/23/2020 11:04 AM  Other:  05/23/2020 11:04 AM  Other:  05/23/2020 11:04 AM  Other: 05/23/2020 11:04 AM    Scribe for Treatment Team: Raina Mina, [place] 05/23/2020 11:04 AM

## 2020-05-23 NOTE — Progress Notes (Signed)
Patient satyed in the milieu for the majority of the shift. Pleasant and cooperative but intrusive at times. Frequently redirected. No aggressive behaviors noted. Patient ate all his meals and snacks. Had no concerns throughout this shift.

## 2020-05-23 NOTE — Progress Notes (Addendum)
1:1 observation 1900-2300 Patient compliant with medications. Blood pressure systolic was in the 08'X so desmopressin was held. Patient asked to take his medication early so he could go to bed. Attention seeking at times, but no episodes of violence or aggression.  2300-0200 Resting in bed with eyes closed 0200-0500 Resting in bed with eyes closed 0500-0700 Resting in bed with eyes closed

## 2020-05-23 NOTE — Progress Notes (Signed)
[place] Group Notes: (Clinical Social Work)   05/23/2020      Type of Therapy:  Group Therapy   Participation Level:  Did Not Attend - was invited individually by Nurse/MHT and chose not to attend.   Raina Mina, [place] 05/23/2020  3:11 PM

## 2020-05-23 NOTE — Plan of Care (Signed)
  Problem: Education: Goal: Knowledge of [facility] Education information/materials will improve Outcome: Not Progressing Goal: Emotional status will improve Outcome: Not Progressing Goal: Mental status will improve Outcome: Not Progressing Goal: Verbalization of understanding the information provided will improve Outcome: Not Progressing   Problem: Activity: Goal: Interest or engagement in activities will improve Outcome: Not Progressing Goal: Sleeping patterns will improve Outcome: Not Progressing   Problem: Coping: Goal: Ability to verbalize frustrations and anger appropriately will improve Outcome: Not Progressing Goal: Ability to demonstrate self-control will improve Outcome: Not Progressing   Problem: Health Behavior/Discharge Planning: Goal: Identification of resources available to assist in meeting health care needs will improve Outcome: Not Progressing Goal: Compliance with treatment plan for underlying cause of condition will improve Outcome: Not Progressing   Problem: Physical Regulation: Goal: Ability to maintain clinical measurements within normal limits will improve Outcome: Not Progressing   Problem: Safety: Goal: Periods of time without injury will increase Outcome: Not Progressing   Problem: Education: Goal: Ability to verbalize precipitating factors for violent behavior will improve Outcome: Not Progressing   Problem: Coping: Goal: Ability to verbalize frustrations and anger appropriately will improve Outcome: Not Progressing   Problem: Health Behavior/Discharge Planning: Goal: Ability to implement measures to prevent violent behavior in the future will improve Outcome: Not Progressing   Problem: Safety: Goal: Ability to demonstrate self-control will improve Outcome: Not Progressing Goal: Ability to redirect hostility and anger into socially appropriate behaviors will improve Outcome: Not Progressing

## 2020-05-23 NOTE — Plan of Care (Signed)
Patient has been cooperative on the unit. Intrusive at times but pleasant and redirectable.

## 2020-05-24 NOTE — Plan of Care (Signed)
  Problem: Education: Goal: Knowledge of [facility] Education information/materials will improve Outcome: Progressing Goal: Emotional status will improve Outcome: Progressing Goal: Mental status will improve Outcome: Progressing Goal: Verbalization of understanding the information provided will improve Outcome: Progressing   Problem: Activity: Goal: Interest or engagement in activities will improve Outcome: Progressing Goal: Sleeping patterns will improve Outcome: Progressing   Problem: Coping: Goal: Ability to verbalize frustrations and anger appropriately will improve Outcome: Progressing Goal: Ability to demonstrate self-control will improve Outcome: Progressing   Problem: Health Behavior/Discharge Planning: Goal: Identification of resources available to assist in meeting health care needs will improve Outcome: Progressing Goal: Compliance with treatment plan for underlying cause of condition will improve Outcome: Progressing   Problem: Physical Regulation: Goal: Ability to maintain clinical measurements within normal limits will improve Outcome: Progressing   Problem: Safety: Goal: Periods of time without injury will increase Outcome: Progressing   Problem: Education: Goal: Ability to verbalize precipitating factors for violent behavior will improve Outcome: Progressing   Problem: Coping: Goal: Ability to verbalize frustrations and anger appropriately will improve Outcome: Progressing   Problem: Health Behavior/Discharge Planning: Goal: Ability to implement measures to prevent violent behavior in the future will improve Outcome: Progressing   Problem: Safety: Goal: Ability to demonstrate self-control will improve Outcome: Progressing Goal: Ability to redirect hostility and anger into socially appropriate behaviors will improve Outcome: Progressing

## 2020-05-24 NOTE — Progress Notes (Signed)
[facility] MD Progress Note  05/24/2020 10:05 AM Austin Gates  MRN:  [id number]  Principal Problem: Schizoaffective disorder, bipolar type ([place]) Diagnosis: Principal Problem:   Schizoaffective disorder, bipolar type (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 male patient with history of schizoaffective disorder, IDD, admitted with with agitation and difficulty controlling his behavior.  Interval History Patient was seen today for re-evaluation.  Nursing reports no events overnight. Patient has been medication compliant. Patient needs a lot of redirections, although he was not agitated or aggressive.  Subjective:  Patient expresses his usual baseline behavior - making multiple requests, wants to be seen ion my office multiple times, focused on discharge, asks to call his guardian and let him go. He is intrusive but redirectable. Patient does not report any specific mental or physical complaints. Reports good mood. Denies feeling depressed or anxious. He denies suicidal/homicidal thoughts. He denies auditory/visual hallucinations. The patient reports no side effects from medications.    Labs: no new results for review. VPA 4/1 60. Last ANC 4.0 from 3/30; next CBC with diff scheduled on 05/27/20.  Total Time spent with patient: 30 minutes  Past Psychiatric History: see H&P  Past Medical History:  Past Medical History:  Diagnosis Date  . Hypertension   . Intellectual disability   . Obsessive-compulsive disorder   . Schizo-affective schizophrenia [facility])     Past Surgical History:  Procedure Laterality Date  . COLONOSCOPY WITH PROPOFOL N/A 12/28/2017   Procedure: COLONOSCOPY WITH PROPOFOL;  Surgeon: Jonathon Bellows, MD;  [facility]COPY;  Service: Gastroenterology;  Laterality: N/A;  . HYDROCELE EXCISION Bilateral 02/22/2018   Procedure: bilateral  HYDROCELECTOMY ADULT;  Surgeon: Cleon Gustin, MD;  Location: [place];  Service: Urology;  Laterality: Bilateral;  . INSERTION OF ILIAC STENT Left 02/22/2018   Procedure: INSERTION LEFT SUPERIOR FEMORAL ARTERY USING 6MM X 5CM VIABHON STENT with mynx device closure on right femoral artery;  Surgeon: Waynetta Sandy, MD;  Location: [place];  Service: Vascular;  Laterality: Left;  . KNEE ARTHROSCOPY    . SCROTAL EXPLORATION N/A 02/22/2018   Procedure: SCROTUM EXPLORATION;  Surgeon: Cleon Gustin, MD;  Location: [place];  Service: Urology;  Laterality: N/A;  . UPPER EXTREMITY ANGIOGRAM Left 02/22/2018   Procedure: left lower EXTREMITY ANGIOGRAM;  Surgeon: Waynetta Sandy, MD;  Location: [facility] OR;  Service: Vascular;  Laterality: Left;   Family History:  Family History  Problem Relation Age of Onset  . Diabetes Mother    Family Psychiatric  History: N/A Social History:  Social History   Substance and Sexual Activity  Alcohol Use No     Social History   Substance and Sexual Activity  Drug Use No    Social History   Socioeconomic History  . Marital status: Single    Spouse name: Not on file  . Number of children: Not on file  . Years of education: Not on file  . Highest education level: Not on file  Occupational History  . Occupation: Disabled  Tobacco Use  . Smoking status: Former Smoker    Types: Cigarettes  . Smokeless tobacco: Never Used  Vaping Use  . Vaping Use: Unknown  Substance and Sexual Activity  . Alcohol use: No  . Drug use: No  . Sexual activity: Never  Other Topics Concern  . Not on file  Social History  Narrative   ** Merged History Encounter **       Social Determinants of Health   Financial Resource Strain: Not on file  Food Insecurity: Not on file  Transportation Needs: Not on file  Physical Activity: Not on file  Stress: Not on file  Social Connections: Not on file   Additional Social History:                          Sleep: Good  Appetite:  Good  Current Medications: Current Facility-Administered Medications  Medication Dose Route Frequency Provider Last Rate Last Admin  . acetaminophen (TYLENOL) tablet 650 mg  650 mg Oral Q6H PRN [name], John T, MD      . albuterol (VENTOLIN HFA) 108 (90 Base) MCG/ACT inhaler 2 puff  2 puff Inhalation Q4H PRN [name], John T, MD      . alum & mag hydroxide-simeth (MAALOX/MYLANTA) 200-200-20 MG/5ML suspension 30 mL  30 mL Oral Q4H PRN [name], [name], MD   30 mL at 05/23/20 2250  . benztropine (COGENTIN) tablet 0.5 mg  0.5 mg Oral BID [name], [name], MD   0.5 mg at 05/24/20 0804  . chlorproMAZINE (THORAZINE) injection 50 mg  50 mg Intramuscular TID PRN [name], John T, MD   50 mg at 05/19/20 1256   And  . diphenhydrAMINE (BENADRYL) injection 25 mg  25 mg Intramuscular TID PRN [name], [name], MD   25 mg at 05/19/20 1300  . clopidogrel (PLAVIX) tablet 75 mg  75 mg Oral Daily Salley Scarlet, MD   75 mg at 05/24/20 0804  . cloZAPine (CLOZARIL) tablet 200 mg  200 mg Oral Daily Salley Scarlet, MD   200 mg at 05/24/20 0804  . desmopressin (DDAVP) tablet 0.2 mg  0.2 mg Oral QHS Selina Cooley M, MD   0.2 mg at 05/23/20 2054  . haloperidol lactate (HALDOL) injection 5 mg  5 mg Intramuscular QID PRN Rulon Sera, MD   5 mg at 05/19/20 1440   And  . LORazepam (ATIVAN) injection 2 mg  2 mg Intramuscular Q4H PRN Rulon Sera, MD   2 mg at 05/19/20 1440   And  . diphenhydrAMINE (BENADRYL) injection 50 mg  50 mg Intramuscular Q4H PRN Rulon Sera, MD   50 mg at 05/18/20 1229  . docusate sodium (COLACE) capsule 100 mg  100 mg Oral BID PRN [name], [name], MD   100 mg at 05/17/20 0803  . ferrous sulfate tablet 325 mg  325 mg Oral Q breakfast [name], [name], MD   325 mg at 05/24/20 0804  . food thickener (THICK IT) powder   Oral PRN [name], John T, MD      . haloperidol (HALDOL) tablet 10 mg  10 mg Oral BID [name], [name], MD   10 mg at 05/24/20 0804  . haloperidol lactate  (HALDOL) injection 5 mg  5 mg Intramuscular Once Rulon Sera, MD      . hydrocerin (EUCERIN) cream   Topical BID Salley Scarlet, MD   Given at 05/24/20 [phone]  . LORazepam (ATIVAN) tablet 1 mg  1 mg Oral BID Salley Scarlet, MD   1 mg at 05/24/20 0804  . magnesium hydroxide (MILK OF MAGNESIA) suspension 30 mL  30 mL Oral Daily PRN [name], John T, MD      . melatonin tablet 5 mg  5 mg Oral QHS PRN [name], [name], MD   5 mg  at 05/16/20 2113  . midodrine (PROAMATINE) tablet 10 mg  10 mg Oral TID AC [name], [name], MD   10 mg at 05/24/20 0804  . neomycin-bacitracin-polymyxin (NEOSPORIN) ointment   Topical BID Salley Scarlet, MD   Given at 05/24/20 [phone]  . nicotine (NICODERM CQ - dosed in mg/24 hours) patch 21 mg  21 mg Transdermal Daily [name], [name], MD   21 mg at 05/24/20 0803  . OLANZapine zydis (ZYPREXA) disintegrating tablet 10 mg  10 mg Oral QID PRN Rulon Sera, MD   10 mg at 05/22/20 4010  . polyethylene glycol (MIRALAX / GLYCOLAX) packet 17 g  17 g Oral Daily PRN [name], John T, MD      . traZODone (DESYREL) tablet 100 mg  100 mg Oral QHS PRN Caroline Sauger, NP   100 mg at 05/21/20 2104  . valproic acid (DEPAKENE) 250 MG/5ML solution 750 mg  750 mg Oral QID [name], John T, MD   750 mg at 05/24/20 0805  . zolpidem (AMBIEN) tablet 5 mg  5 mg Oral QHS Salley Scarlet, MD   5 mg at 05/23/20 2100    Lab Results:  No results found for this or any previous visit (from the past 48 hour(s)).  Blood Alcohol level:  Lab Results  Component Value Date   ETH <10 04/30/2020   ETH <10 27/25/3664    Metabolic Disorder Labs: Lab Results  Component Value Date   HGBA1C 5.4 09/30/2016   MPG 108.28 09/30/2016   MPG 114 09/17/2016   Lab Results  Component Value Date   PROLACTIN 11.8 05/01/2020   Lab Results  Component Value Date   CHOL 135 09/30/2016   TRIG 104 02/25/2018   HDL 60 09/30/2016   CHOLHDL 2.3 09/30/2016   VLDL 12 09/30/2016   LDLCALC 63 09/30/2016     Physical Findings: AIMS: Facial and Oral Movements Muscles of Facial Expression: None, normal Lips and Perioral Area: None, normal Jaw: None, normal Tongue: Moderate,Extremity Movements Upper (arms, wrists, hands, fingers): None, normal Lower (legs, knees, ankles, toes): None, normal, Trunk Movements Neck, shoulders, hips: None, normal, Overall Severity Severity of abnormal movements (highest score from questions above): None, normal Incapacitation due to abnormal movements: None, normal Patient's awareness of abnormal movements (rate only patient's report): No Awareness, Dental Status Current problems with teeth and/or dentures?: No Does patient usually wear dentures?: No  CIWA:    COWS:     Musculoskeletal: Strength & Muscle Tone: within normal limits Gait & Station: unsteady Patient leans: N/A  Psychiatric Specialty Exam:  Presentation  General Appearance: Casual  Eye Contact:Fair  Speech:Garbled  Speech Volume:Increased  Handedness:Right   Mood and Affect  Mood:Euthymic  Affect:Constricted   Thought Process  Thought Processes:Goal Directed  Descriptions of Associations:Loose  Orientation:-- (Oriented to person and place)  Thought Content:Perseveration  History of Schizophrenia/Schizoaffective disorder:No data recorded Duration of Psychotic Symptoms:No data recorded Hallucinations: none Ideas of Reference:None  Suicidal Thoughts: denies Homicidal Thoughts: denies  Sensorium  Memory:Immediate Poor; Recent Poor; Remote Poor  Judgment:Impaired  Insight:Lacking   Executive Functions  Concentration:Poor  Attention Span:Poor  Recall:Poor  Fund of Knowledge:Poor  Language:Poor   Psychomotor Activity  Psychomotor Activity:No data recorded  Assets  Assets:Desire for Improvement; Financial Resources/Insurance; Social Support   Sleep  Sleep:No data recorded   Physical Exam: Physical Exam  ROS  Blood pressure 90/61, pulse 85,  temperature 97.8 F (36.6 C), temperature source Oral, resp. rate 18, height 5\' 11"  (1.803 m), weight  75.8 kg, SpO2 99 %. Body mass index is 23.29 kg/m.   Treatment Plan Summary: Daily contact with patient to assess and evaluate symptoms and progress in treatment and Medication management  Patient is a 60 year old male with the above-stated past psychiatric history who is seen in follow-up.  Chart reviewed. Patient discussed with nursing. Patient remains at his mental and behavioral baseline.   Plan: -continue inpatient psych admission; 15-minute checks or 1:1 when needed; daily contact with patient to assess and evaluate symptoms and progress in treatment; psychoeducation.  -medications:  Schizoaffective disorder, bipolar type; moderate ID (IQ 48)- behavioral disturbance, agitation - Continue Clozaril 200 mg QHS, ANC 4000 on 4/1, weekly CBC for ANC checks, next due 4/6.  -Valproic acid level 58 on Depakote 750 mg four times daily. Patient appears to be at his baseline so will not increase despite being mildly subtherapeutic  - Continue Haldol 10 mg BID - Ativan 1 mg BID - Ambien 5 mg QHS   Mild asthma without complication- established problem, controlled - Albutrol PRN  Slow transit constipation- established problem, controlled  - Colace 100 mg BID, milk of magnesia PRN  Orthostatic hypotension- established problem, controlled - Midodrine 10 mg TID before meals  Tobacco Use disorder - Nicoderm 21 mg patch  -continue PRN medications.  acetaminophen, albuterol, alum & mag hydroxide-simeth, chlorproMAZINE (THORAZINE) injection **AND** diphenhydrAMINE, haloperidol lactate **AND** LORazepam **AND** diphenhydrAMINE, docusate sodium, food thickener, magnesium hydroxide, melatonin, OLANZapine zydis, polyethylene glycol, traZODone  -Pertinent Labs: no new labs ordered today; ANC 4000 on 4/1, weekly CBC for ANC checks, next due 4/6.    -Consults: No new consults placed since  yesterday    -Disposition: patient is awaiting placement to a facility. All necessary aftercare will be arranged prior to discharge.  -  I certify that the patient does need, on a daily basis, active treatment furnished directly by or requiring the supervision of inpatient psychiatric facility personnel.    Larita Fife, MD 05/24/2020, 10:05 AM

## 2020-05-24 NOTE — Progress Notes (Signed)
1:1 observation [id number] Asking for frequent snacks. Patient passing a lot of gas after eating ice cream and pudding. No violent outbursts. Asking for his car keys so he could go get some chicken. "I got my permit in the 8th grade".  2300-0200 Awake asking for medication for heartburn. No further complaints 0200-0500 Resting in bed with eyes closed. 0500-0700 Resting in room without complaints.

## 2020-05-24 NOTE — BHH Group Notes (Signed)
[place] Group Notes:  (Nursing/MHT/Case Management/Adjunct)  Date:  05/24/2020  Time:  8:37 PM  Type of Therapy:  Group Therapy  Participation Level:  Active  Participation Quality:  Redirectable  Affect:  Anxious  Cognitive:  Alert  Insight:  Good  Engagement in Group:  Engaged and talking about the game and the rule on the unit here.  Modes of Intervention:  Discussion  Summary of Progress/Problems:  Austin Gates 05/24/2020, 8:37 PM

## 2020-05-24 NOTE — Progress Notes (Signed)
Patient has been experiencing difficulty swallowing his food. He seems to be in need of soft/puree diet.

## 2020-05-24 NOTE — Plan of Care (Signed)
Patient got up for breakfast and medications and was cooperative. Returned to bed reporting that he needed to rest. Currently sleeping and safety monitored on 1:1.

## 2020-05-25 NOTE — Progress Notes (Signed)
Patient ate dinner and took his evening medications. He took a shower and returned to the dayroom to watch TV. He continues to ask when he can go home. Patient is not observed to be in any distress. He remains on 1:1.

## 2020-05-25 NOTE — Plan of Care (Signed)
  Problem: Education: Goal: Knowledge of [facility] Education information/materials will improve Outcome: Progressing Goal: Emotional status will improve Outcome: Progressing Goal: Mental status will improve Outcome: Progressing Goal: Verbalization of understanding the information provided will improve Outcome: Progressing   Problem: Activity: Goal: Interest or engagement in activities will improve Outcome: Progressing Goal: Sleeping patterns will improve Outcome: Progressing   Problem: Coping: Goal: Ability to verbalize frustrations and anger appropriately will improve Outcome: Progressing Goal: Ability to demonstrate self-control will improve Outcome: Progressing   Problem: Health Behavior/Discharge Planning: Goal: Identification of resources available to assist in meeting health care needs will improve Outcome: Progressing Goal: Compliance with treatment plan for underlying cause of condition will improve Outcome: Progressing   Problem: Physical Regulation: Goal: Ability to maintain clinical measurements within normal limits will improve Outcome: Progressing   Problem: Safety: Goal: Periods of time without injury will increase Outcome: Progressing   Problem: Education: Goal: Ability to verbalize precipitating factors for violent behavior will improve Outcome: Progressing   Problem: Coping: Goal: Ability to verbalize frustrations and anger appropriately will improve Outcome: Progressing   Problem: Health Behavior/Discharge Planning: Goal: Ability to implement measures to prevent violent behavior in the future will improve Outcome: Progressing   Problem: Safety: Goal: Ability to demonstrate self-control will improve Outcome: Progressing Goal: Ability to redirect hostility and anger into socially appropriate behaviors will improve Outcome: Progressing

## 2020-05-25 NOTE — Progress Notes (Signed)
CSW contacted the following group homes. FL2 was sent to Asher Muir, house of [employer] and [employer] group homes. CSW was unable to secure placement for remaining facilities listed.   Name  Representative Phone [address] Harlow Mares [phone]  [place] [phone]  [place] [phone]  [place] [phone]  [facility] [place] [phone]  [place] [phone]  [place] [phone] Dineen Kid [phone]  [facility] [phone]  [place]  [phone]  [facility] [phone]  [facility] [place] [phone]  [place] [phone]  [place] [phone]  [place] [phone]  [place] [phone] Lisabeth Devoid [place] [phone]  [place] [phone]  [place] [phone]  [place] [phone]  [place] [phone]  [place] [phone]  [place] [phone], [facility] [employer] [phone]    Signed:  Durenda Hurt,  MSW, [place], [place] 05/25/2020 3:37 PM

## 2020-05-25 NOTE — Progress Notes (Signed)
Patient continued to loudly state "I need to go home. I need to go home". Patient was given PO Zyprexa for agitation.

## 2020-05-25 NOTE — BHH Counselor (Signed)
CSW followed up regarding referrals with the following results  [facility]: Stated that they did not have their person leave but that our patient was appropriate for the home. CSW was encouraged to call again on Friday for an update.   [facility]: still under review  [facility]): left message to have him call CSW back.   [facility]: left VM  Rishon Thilges R. Guerry Bruin, MSW, [place], [place] 05/25/2020 4:19 PM

## 2020-05-25 NOTE — BHH Counselor (Signed)
CSW contacted pt's guardian Austin Gates on 05/07/20 and was given verbal consents for pt's care.  Austin Gates, MSW, LCSW-A 4/4/202210:33 AM

## 2020-05-25 NOTE — BHH Counselor (Signed)
CSW contacted the following group homes in attempt to procure placement for patient. FL2 was sent to [employer] and [employer] group homes. CSW was not able to procure placement.     [place] [phone]  [place] [phone]  [place] [phone]  [place] [phone] [facility] [phone] III Kristeen Mans [phone]  [place] [phone]  [place] [phone]   [place] [phone]   [place], [place]  [phone]   [facility] [place] [phone]   [place] [phone]  [place]elie Skinner [phone]  [place] / [place] [phone]   [place] [phone]  [facility] [place] [phone]  [place] [phone]    Otilio Groleau [place], MSW, LCSW-A 4/4/20224:21 PM

## 2020-05-25 NOTE — BHH Counselor (Signed)
Patient referred out to group homes listed in centralized referring system. FL2 sent for consideration. Legal Guardian provided verbal consent.  See list below.   Destination  Patient indicates having no preference.  Service Provider Address Phone Fax  [facility] [place] [address]  [address] [address] [phone]  [facility] [place] [address]  [address] [place] [address] [phone]  [facility] [place] [address] [facility]  [address] [place] [address] [phone]  [facility] [place] [address]  [address], [place] [address] [phone]  [facility] [place] [address] [place], [place] [address] [phone]  [facility]  [address] [place] [address] [phone] [place], [place] [address] [phone] [place]  [place], [place] [address] [phone]  [facility]  [address] [place] [address] [phone]  [facility] (women) [place], [place] [address] [phone]  [facility] [place] (men) [place]  [address] [place] [address] [phone] [place], [place] [address] [phone] [place]  [address] [place] [address] [phone]  [facility] [place]  [address] [place], [place] [address] [place]  [place], [place] [address] [phone] [place]  [place], [place] [address] [phone]  [facility] [facility]  [address], [place] [address] [phone] [place]  [address] [place] [address] [phone]  [facility]  [address], [place] [address] [phone]  [facility] #1 [facility]  [address], [place] [address] [phone]  [facility] #3-[place] [facility]  [address] [place] [address] [phone]  [facility] [place] [facility]-[place] [facility]  [address] [place] [address] [phone]  [facility] [place] [address] [place], [place] [address] [phone]  [facility] #[facility]  [address] [place] [address] [phone]  [facility]  [address], [place] [address] [phone]  [facility] [facility]  [address], [place] [address] [phone]  [facility]  [address] [place] [address] [place] [facility] [place]  [place], [place] [address] [phone]  [place] [facility]  [address] [place] [address] [phone]  [facility]  [address] [place] [address] [phone]    Signed:  Durenda Hurt, MSW, LCSWA, LCASA 05/25/2020 10:36 AM

## 2020-05-25 NOTE — BHH Counselor (Signed)
CSW contacted the following in hopes of procuring placement:  [place] [facility] [phone] Advised to call main office at [phone]. VM left  [place] [facility] [phone] Advised to call main office at [phone]. VM left  [facility] Day [phone] Advised to call main office at [phone]. VM left  [facility] [phone] Advised to call main office at [phone]. VM left  [facility] Day [phone] Advised to call main office at [phone]. VM left  [place] [facility] [phone] Advised to call main office at [phone]. VM left  [place] [facility] [phone] Advised to call main office at [phone]. VM left  [facility] #2 Linward Foster [phone] VM left  [place] [facility] [phone] Advised to call main office at [phone]. VM left  [facility] #3 Linward Foster [phone] VM left  [place] [phone] phone no longer in [place] [phone] Advised to call main office at [phone]. VM left  [facility] #1 Linward Foster [phone] VM left  [place] [phone] VM left  [place] [phone]   VM left  [place] [phone] VM left  [place] [phone] VM left  [place] [phone] VM left  [place] [phone] VM left   [place] [phone] VM left  [place] [phone] Requested the care plan be sent to email  [place] [phone] VM full unable to leave message  [place] [phone] VM left  [place] [phone] No answer and no VM set up  [place] [phone] Advised to call Ron Agee at [phone]. VM left  [place] Annabelle Harman [phone] Will have a facility opening soon but not running for another 3-4 weeks  [place] [phone] No answer and no VM set up  [facility] [phone] VM left  [place] [phone] VM left   CSW will continue to follow regarding placement.   Chalmers Guest. Guerry Bruin, MSW, [place], [place] 05/25/2020 4:17 PM

## 2020-05-25 NOTE — BHH Counselor (Signed)
CSW contacted the following group homes. FL2 was sent for admission consideration to Burnice Logan for consideration. CSW was unable to reach any other listed.   Name  Representative Phone  Fax  [place] [phone]  [place] [phone]  [place] [phone]  [facility] [place] [phone]  [place] [phone]  [place] [phone]  [place] [phone]  [facility] hatiie j dunham [phone]  [place] Burnice Logan [phone]  [facility] for [place] [phone]  [place] [phone]  [place] the [place] [phone]  [facility] [place]. #2 [place] [phone]  [place] [phone]   Signed:  Durenda Hurt, MSW, [place], [place] 05/25/2020 12:37 PM

## 2020-05-25 NOTE — Plan of Care (Signed)
  Problem: Education: Goal: Emotional status will improve Outcome: Progressing Goal: Mental status will improve Outcome: Not Progressing   Problem: Activity: Goal: Interest or engagement in activities will improve Outcome: Progressing Goal: Sleeping patterns will improve Outcome: Progressing   Problem: Coping: Goal: Ability to verbalize frustrations and anger appropriately will improve Outcome: Progressing

## 2020-05-25 NOTE — Progress Notes (Signed)
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 male patient with history of schizoaffective disorder, IDD, admitted with with agitation and difficulty controlling his behavior.  Interval History Patient was seen today for re-evaluation.  Nursing reports no events overnight. Patient has been medication compliant. Patient needs a lot of redirections, although he was not agitated or aggressive.  Subjective:  Patient seen this morning. He continues to fixate on discharge, and is unable to comprehend lack of disposition. He asks to discharge to jail or "the streets." He is intrusive, but easily redirectable. He has no mental or physical complaints. He continues to deny suicidal ideations, homicidal ideations, visual hallucinations, or auditory hallucinations.    Total Time spent with patient: 30 minutes  Past Psychiatric History: see H&P  Past Medical History:  Past Medical History:  Diagnosis Date  . Hypertension   . Intellectual disability   . Obsessive-compulsive disorder   . Schizo-affective schizophrenia [facility])     Past Surgical History:  Procedure Laterality Date  . COLONOSCOPY WITH PROPOFOL N/A 12/28/2017   Procedure: COLONOSCOPY WITH PROPOFOL;  Surgeon: Jonathon Bellows, MD;  Location: [facility] ENDOSCOPY;  Service: Gastroenterology;  Laterality: N/A;  . HYDROCELE EXCISION Bilateral 02/22/2018   Procedure: bilateral HYDROCELECTOMY ADULT;  Surgeon: Cleon Gustin, MD;  Location: [place];  Service: Urology;  Laterality: Bilateral;  . INSERTION OF ILIAC STENT Left 02/22/2018   Procedure: INSERTION LEFT SUPERIOR FEMORAL ARTERY  USING 6MM X 5CM VIABHON STENT with mynx device closure on right femoral artery;  Surgeon: Waynetta Sandy, MD;  Location: [place];  Service: Vascular;  Laterality: Left;  . KNEE ARTHROSCOPY    . SCROTAL EXPLORATION N/A 02/22/2018   Procedure: SCROTUM EXPLORATION;  Surgeon: Cleon Gustin, MD;  Location: [place];  Service: Urology;  Laterality: N/A;  . UPPER EXTREMITY ANGIOGRAM Left 02/22/2018   Procedure: left lower EXTREMITY ANGIOGRAM;  Surgeon: Waynetta Sandy, MD;  [facility] OR;  Service: Vascular;  Laterality: Left;   Family History:  Family History  Problem Relation Age of Onset  . Diabetes Mother    Family Psychiatric  History: N/A Social History:  Social History   Substance and Sexual Activity  Alcohol Use No     Social History   Substance and Sexual Activity  Drug Use No    Social History   Socioeconomic History  . Marital status: Single    Spouse name: Not on file  . Number of children: Not on file  . Years of education: Not on file  . Highest education level: Not on file  Occupational History  . Occupation: Disabled  Tobacco Use  . Smoking status: Former Smoker    Types: Cigarettes  . Smokeless tobacco: Never Used  Vaping Use  . Vaping Use: Unknown  Substance and Sexual Activity  . Alcohol use: No  . Drug use: No  . Sexual activity: Never  Other Topics Concern  . Not on file  Social History Narrative   ** Merged History Encounter **       Social Determinants of Health   Financial Resource Strain: Not on file  Food Insecurity: Not on file  Transportation Needs: Not on file  Physical Activity: Not on file  Stress: Not on file  Social Connections: Not on file   Additional Social History:                         Sleep: Good  Appetite:  Good  Current Medications: Current Facility-Administered Medications  Medication Dose Route Frequency Provider Last Rate Last Admin  . acetaminophen (TYLENOL) tablet 650 mg  650 mg  Oral Q6H PRN [name], John T, MD      . albuterol (VENTOLIN HFA) 108 (90 Base) MCG/ACT inhaler 2 puff  2 puff Inhalation Q4H PRN [name], John T, MD      . alum & mag hydroxide-simeth (MAALOX/MYLANTA) 200-200-20 MG/5ML suspension 30 mL  30 mL Oral Q4H PRN [name], [name], MD   30 mL at 05/23/20 2250  . benztropine (COGENTIN) tablet 0.5 mg  0.5 mg Oral BID [name], [name], MD   0.5 mg at 05/25/20 3474  . chlorproMAZINE (THORAZINE) injection 50 mg  50 mg Intramuscular TID PRN [name], John T, MD   50 mg at 05/19/20 1256   And  . diphenhydrAMINE (BENADRYL) injection 25 mg  25 mg Intramuscular TID PRN [name], [name], MD   25 mg at 05/19/20 1300  . clopidogrel (PLAVIX) tablet 75 mg  75 mg Oral Daily Salley Scarlet, MD   75 mg at 05/25/20 2595  . cloZAPine (CLOZARIL) tablet 200 mg  200 mg Oral Daily Salley Scarlet, MD   200 mg at 05/25/20 6387  . desmopressin (DDAVP) tablet 0.2 mg  0.2 mg Oral QHS Selina Cooley M, MD   0.2 mg at 05/24/20 2041  . haloperidol lactate (HALDOL) injection 5 mg  5 mg Intramuscular QID PRN Rulon Sera, MD   5 mg at 05/19/20 1440   And  . LORazepam (ATIVAN) injection 2 mg  2 mg Intramuscular Q4H PRN Rulon Sera, MD   2 mg at 05/19/20 1440   And  . diphenhydrAMINE (BENADRYL) injection 50 mg  50 mg Intramuscular Q4H PRN Rulon Sera, MD   50 mg at 05/18/20 1229  . docusate sodium (COLACE) capsule 100 mg  100 mg Oral BID PRN [name], [name], MD   100 mg at 05/17/20 0803  . ferrous sulfate tablet 325 mg  325 mg Oral Q breakfast [name], [name], MD   325 mg at 05/25/20 0811  . food thickener (THICK IT) powder   Oral PRN [name], John T, MD      . haloperidol (HALDOL) tablet 10 mg  10 mg Oral BID [name], [name], MD   10 mg at 05/25/20 5643  . haloperidol lactate (HALDOL) injection 5 mg  5 mg Intramuscular Once Rulon Sera, MD      . hydrocerin (EUCERIN) cream   Topical BID Salley Scarlet, MD   1 application at 32/95/18 [phone]  . LORazepam (ATIVAN) tablet 1 mg  1 mg Oral  BID Salley Scarlet, MD   1 mg at 05/25/20 0815  . magnesium hydroxide (MILK OF MAGNESIA) suspension 30 mL  30 mL Oral Daily PRN [name], John T, MD      . melatonin tablet 5 mg  5 mg Oral QHS PRN [name], [name], MD   5 mg at 05/16/20 2113  . midodrine (PROAMATINE) tablet 10 mg  10 mg Oral TID AC [name], [name], MD   10 mg at 05/25/20 0811  . neomycin-bacitracin-polymyxin (NEOSPORIN) ointment  packet   Topical BID Salley Scarlet, MD   1 application at 77/93/90 [phone]  . nicotine (NICODERM CQ - dosed in mg/24 hours) patch 21 mg  21 mg Transdermal Daily [name], [name], MD   21 mg at 05/25/20 0820  . OLANZapine zydis (ZYPREXA) disintegrating tablet 10 mg  10 mg Oral QID PRN Rulon Sera, MD   10 mg at 05/25/20 0902  . polyethylene glycol (MIRALAX / GLYCOLAX) packet 17 g  17 g Oral Daily PRN [name], John T, MD      . traZODone (DESYREL) tablet 100 mg  100 mg Oral QHS PRN Caroline Sauger, NP   100 mg at 05/21/20 2104  . valproic acid (DEPAKENE) 250 MG/5ML solution 750 mg  750 mg Oral QID [name], John T, MD   750 mg at 05/25/20 2330  . zolpidem (AMBIEN) tablet 5 mg  5 mg Oral QHS Salley Scarlet, MD   5 mg at 05/24/20 2041    Lab Results:  No results found for this or any previous visit (from the past 48 hour(s)).  Blood Alcohol level:  Lab Results  Component Value Date   ETH <10 04/30/2020   ETH <10 07/62/2633    Metabolic Disorder Labs: Lab Results  Component Value Date   HGBA1C 5.4 09/30/2016   MPG 108.28 09/30/2016   MPG 114 09/17/2016   Lab Results  Component Value Date   PROLACTIN 11.8 05/01/2020   Lab Results  Component Value Date   CHOL 135 09/30/2016   TRIG 104 02/25/2018   HDL 60 09/30/2016   CHOLHDL 2.3 09/30/2016   VLDL 12 09/30/2016   LDLCALC 63 09/30/2016    Physical Findings: AIMS: Facial and Oral Movements Muscles of Facial Expression: None, normal Lips and Perioral Area: None, normal Jaw: None, normal Tongue: Moderate,Extremity Movements Upper  (arms, wrists, hands, fingers): None, normal Lower (legs, knees, ankles, toes): None, normal, Trunk Movements Neck, shoulders, hips: None, normal, Overall Severity Severity of abnormal movements (highest score from questions above): None, normal Incapacitation due to abnormal movements: None, normal Patient's awareness of abnormal movements (rate only patient's report): No Awareness, Dental Status Current problems with teeth and/or dentures?: No Does patient usually wear dentures?: No  CIWA:    COWS:     Musculoskeletal: Strength & Muscle Tone: within normal limits Gait & Station: unsteady Patient leans: N/A  Psychiatric Specialty Exam:  Presentation  General Appearance: Casual  Eye Contact:Fair  Speech:Garbled  Speech Volume:Increased  Handedness:Right   Mood and Affect  Mood:Euthymic  Affect:Constricted   Thought Process  Thought Processes:Goal Directed  Descriptions of Associations:Loose  Orientation:-- (Oriented to person and place)  Thought Content:Perseveration  History of Schizophrenia/Schizoaffective disorder: Yes Duration of Psychotic Symptoms:Greater than 6 months Hallucinations: none Ideas of Reference:None  Suicidal Thoughts: denies Homicidal Thoughts: denies  Sensorium  Memory:Immediate Poor; Recent Poor; Remote Poor  Judgment:Impaired  Insight:Lacking   Executive Functions  Concentration:Poor  Attention Span:Poor  Recall:Poor  Fund of Knowledge:Poor  Language:Poor   Psychomotor Activity  Psychomotor Activity:Restless  Assets  Assets:Desire for Improvement; Financial Resources/Insurance; Social Support   Sleep  Sleep:Good, 8.5 hours   Physical Exam: Physical Exam  ROS  Blood pressure 116/81, pulse 86, temperature 98.5 F (36.9 C), temperature source Oral, resp. rate 18, height 5\' 11"  (1.803 m), weight 75.8 kg, SpO2 94 %. Body mass index is 23.29 kg/m.   Treatment Plan Summary: Daily contact with patient to  assess and evaluate symptoms and progress in treatment and Medication management  Patient is a 60 year old male with the above-stated past psychiatric history who is seen in follow-up.  Chart reviewed. Patient discussed with nursing. Patient remains at his mental and behavioral baseline.   Plan: -continue inpatient psych admission;  daily contact with patient to assess and evaluate symptoms and progress in treatment; psychoeducation.  -medications:  Schizoaffective disorder, bipolar type; moderate ID (IQ 48)- behavioral disturbance, agitation - Continue Clozaril 200 mg QHS, ANC 4000 on 4/1, weekly CBC for ANC checks, next due 4/6.  -Valproic acid level 58 on Depakote 750 mg four times daily. Patient appears to be at his baseline so will not increase despite being mildly subtherapeutic  - Continue Haldol 10 mg BID - Ativan 1 mg BID - Ambien 5 mg QHS   Mild asthma without complication- established problem, controlled - Albutrol PRN  Slow transit constipation- established problem, controlled  - Colace 100 mg BID, milk of magnesia PRN  Orthostatic hypotension- established problem, controlled - Midodrine 10 mg TID before meals  Tobacco Use disorder - Nicoderm 21 mg patch  -continue PRN medications.  acetaminophen, albuterol, alum & mag hydroxide-simeth, chlorproMAZINE (THORAZINE) injection **AND** diphenhydrAMINE, haloperidol lactate **AND** LORazepam **AND** diphenhydrAMINE, docusate sodium, food thickener, magnesium hydroxide, melatonin, OLANZapine zydis, polyethylene glycol, traZODone  -Pertinent Labs: no new labs ordered today; ANC 4000 on 4/1, weekly CBC for ANC checks, next due 4/6.    -Consults: No new consults placed since yesterday    -Disposition: patient is awaiting placement to a facility. All necessary aftercare will be arranged prior to discharge.  -  I certify that the patient does need, on a daily basis, active treatment furnished directly by or requiring the  supervision of inpatient psychiatric facility personnel.   05/25/20: Psychiatric exam above reviewed and remains accurate. Assessment and plan above reviewed and updated.   Salley Scarlet, MD 05/25/2020, 10:17 AM

## 2020-05-25 NOTE — Progress Notes (Signed)
Patient was cooperative with staff and  redirectable when needed. Patient took his medications with his breakfast. He denies SI, HI, and AVH. At 8:50am, he had a moment where he raised his voice and said "I want to go home, I want to go home". He did not become aggressive and was verbally de-escalated.

## 2020-05-25 NOTE — Progress Notes (Signed)
Patient ate his lunch, took his medications, and returned back to his room. Patient is not observed to be in any distress. He remains on 1:1.

## 2020-05-25 NOTE — Progress Notes (Signed)
1:1 observation note 1900-2300 Patient pleasant and cooperative. Asking for food constantly. Given thickened liquids. No acts of aggression. No choking incidents. Singing. Denies SI, HI and AVH. Speech remains slurred and difficult to understand. 2300-0200 Resting in bed with eyes closed 0200-0500 Resting in bed with eyes closed 0500-0700 Resting in room without complaints

## 2020-05-25 NOTE — Progress Notes (Signed)
Recreation Therapy Notes  Date: 05/25/2020  Time: 9:30 am  Location: Craft room   Behavioral response: Appropriate   Intervention Topic: Stress Management   Discussion/Intervention:  Group content on today was focused on stress. The group defined stress and way to cope with stress. Participants expressed how they know when they are stresses out. Individuals described the different ways they have to cope with stress. The group stated reasons why it is important to cope with stress. Patient explained what good stress is and some examples. The group participated in the intervention "Stress Management". Individuals were able to brainstorm new ways to manage their stress.  Clinical Observations/Feedback:  Patient came to group and explained that he manages his stress by walking. Individual was social with peers and staff while participating in the intervention. He left group early to return to room and lie down.  Austin Gates LRT/CTRS            Zaahir Pickney 05/25/2020 11:23 AM

## 2020-05-25 NOTE — BHH Group Notes (Signed)
LCSW Group Therapy Note     05/25/2020 2:56 PM     Type of Therapy and Topic:  Group Therapy:  Overcoming Obstacles     Participation Level:  Did Not Attend     Description of Group:     In this group patients will be encouraged to explore what they see as obstacles to their own wellness and recovery. They will be guided to discuss their thoughts, feelings, and behaviors related to these obstacles. The group will process together ways to cope with barriers, with attention given to specific choices patients can make. Each patient will be challenged to identify changes they are motivated to make in order to overcome their obstacles. This group will be process-oriented, with patients participating in exploration of their own experiences as well as giving and receiving support and challenge from other group members.     Therapeutic Goals:  1.    Patient will identify personal and current obstacles as they relate to admission.  2.    Patient will identify barriers that currently interfere with their wellness or overcoming obstacles.  3.    Patient will identify feelings, thought process and behaviors related to these barriers.  4.    Patient will identify two changes they are willing to make to overcome these obstacles:        Summary of Patient Progress: X     Therapeutic Modalities:    Cognitive Behavioral Therapy  Solution Focused Therapy  Motivational Interviewing  Relapse Prevention Therapy     Austin Gates, MSW, [place]  05/25/2020 2:56 PM

## 2020-05-25 NOTE — BHH Counselor (Signed)
CSW spoke with A. Sharlett Iles of [place]. Sharlett Iles stated that pt would not be a "good fit" for their home. No other concerns expressed. Contact ended without incident.   Chalmers Guest. Guerry Bruin, MSW, [place], [place] 05/25/2020 2:39 PM

## 2020-05-25 NOTE — BHH Counselor (Signed)
CSW received call from [place] at [facility] to verify that pt is still here and he remains on the wait list.   Austin Gates. Austin Gates, MSW, [place], [place] 05/25/2020 11:51 AM

## 2020-05-26 MED ORDER — VALPROIC ACID 250 MG/5ML PO SOLN
750.0000 mg | Freq: Four times a day (QID) | ORAL | Status: DC
  Administered 2020-05-26 – 2020-05-27 (×6): 750 mg via ORAL
  Filled 2020-05-26 (×14): qty 15

## 2020-05-26 NOTE — Plan of Care (Signed)
Pt denies depression, anxiety, SI, HI and AVH. Pt was educated on care plan and verbalizes understanding. Pt has 1:1 for safety. Pt is cooperative and med compliant. [name] RN Problem: Education: Goal: Knowledge of [place] [facility] Education information/materials will improve Outcome: Progressing Goal: Emotional status will improve Outcome: Progressing Goal: Mental status will improve Outcome: Progressing Goal: Verbalization of understanding the information provided will improve Outcome: Progressing   Problem: Activity: Goal: Interest or engagement in activities will improve Outcome: Progressing Goal: Sleeping patterns will improve Outcome: Progressing   Problem: Coping: Goal: Ability to verbalize frustrations and anger appropriately will improve Outcome: Progressing Goal: Ability to demonstrate self-control will improve Outcome: Progressing   Problem: Health Behavior/Discharge Planning: Goal: Identification of resources available to assist in meeting health care needs will improve Outcome: Progressing Goal: Compliance with treatment plan for underlying cause of condition will improve Outcome: Progressing   Problem: Physical Regulation: Goal: Ability to maintain clinical measurements within normal limits will improve Outcome: Progressing   Problem: Safety: Goal: Periods of time without injury will increase Outcome: Progressing   Problem: Education: Goal: Ability to verbalize precipitating factors for violent behavior will improve Outcome: Progressing   Problem: Coping: Goal: Ability to verbalize frustrations and anger appropriately will improve Outcome: Progressing   Problem: Health Behavior/Discharge Planning: Goal: Ability to implement measures to prevent violent behavior in the future will improve Outcome: Progressing   Problem: Safety: Goal: Ability to demonstrate self-control will improve Outcome: Progressing Goal: Ability to redirect hostility and  anger into socially appropriate behaviors will improve Outcome: Progressing

## 2020-05-26 NOTE — Progress Notes (Signed)
Recreation Therapy Notes   Date: 05/26/2020  Time: 2:30pm  Location: Courtyard   Behavioral response: Appropriate  Group Type: Leisure  Participation level: Active  Communication: Patient was social with peers and staff. Patient was upbeat while dancing, singing and playing basketball with peers.   Comments: N/A  Kyllian Clingerman LRT/CTRS        Qu[place] T[place] 05/26/2020 3:47 PM

## 2020-05-26 NOTE — Plan of Care (Signed)
Pt presents at his baseline   Problem: Education: Goal: Emotional status will improve Outcome: Not Progressing Goal: Mental status will improve Outcome: Not Progressing

## 2020-05-26 NOTE — Progress Notes (Signed)
Patient on 1:1 for safety with a sitter present. Patient calm and cooperative tonight with this Probation officer. Pt given education, support and encouragement to be active in his treatment plan. Pt observed interacting appropriately with staff and peers on the unit. Pt compliant with medication administration per MD orders. Pt remains safe on the unit.

## 2020-05-26 NOTE — BHH Group Notes (Signed)
LCSW Group Therapy Note  05/26/2020 3:15 PM  Type of Therapy/Topic:  Group Therapy:  Feelings about Diagnosis  Participation Level:  Minimal   Description of Group:   This group will allow patients to explore their thoughts and feelings about diagnoses they have received. Patients will be guided to explore their level of understanding and acceptance of these diagnoses. Facilitator will encourage patients to process their thoughts and feelings about the reactions of others to their diagnosis and will guide patients in identifying ways to discuss their diagnosis with significant others in their lives. This group will be process-oriented, with patients participating in exploration of their own experiences, giving and receiving support, and processing challenge from other group members.   Therapeutic Goals: 1. Patient will demonstrate understanding of diagnosis as evidenced by identifying two or more symptoms of the disorder 2. Patient will be able to express two feelings regarding the diagnosis 3. Patient will demonstrate their ability to communicate their needs through discussion and/or role play  Summary of Patient Progress: Patient was present for the majority of the group. His comments were not germane to the topic at hand. He spent most of the group asking what time it was.   Therapeutic Modalities:   Cognitive Behavioral Therapy Brief Therapy Feelings Identification   Austin Gates Bruin, MSW, [place], [place] 05/26/2020 3:15 PM

## 2020-05-26 NOTE — Plan of Care (Signed)
  Problem: Disruptive/Impulsive Goal: STG - Patient will focus on task/topic with 2 prompts from staff within 5 recreation therapy group sessions Description: STG - Patient will focus on task/topic with 2 prompts from staff within 5 recreation therapy group sessions Outcome: Progressing

## 2020-05-26 NOTE — BHH Counselor (Signed)
CSW contacted the following places for potential placement:  [facility] [phone] Roylene Reason message that call could not be completed at this time  [place] [phone] Alphonzo Grieve left  [facility]  [phone]   Mateo Flow Number not in service  [place] [facility] Darlyn Read [phone] [place] VM left  [place] [address] [place] [phone] [facility] VM left  [facility] [place] [phone] Mateo Flow No answer and no VM set up  [facility] [phone] [place] No beds at this house but told to call the corporate office at [place] [phone] [place] No bed availability  [facility] Jenetta Loges [phone] [place] VM left  [facility] [phone] [place] No bed availability  [place] [phone] [place] Informed to email [email or web address] to get info about availability  [place] Maryjean Ka [phone] [place] No answer and phone swtiched over to fax  [facility] [phone] [place] VM left   [place] [phone] [place] No bed availability  [facility] Leeroy Bock [phone] [place] No bed availability  [place] [phone] [place] No bed availability  [place] [phone] [facility] No bed availability  [place] [address] [place] [phone] [facility] No bed availability  [facility] [place] [phone] [facility] No bed availability  [place] [address] [place] [phone] [facility] No bed availability  [place] [phone] [facility] No bed availability  [place] [address] [place] [phone] [facility] No bed availability  [facility] [place] [phone] [facility] No bed availability  [facility] [place] [phone] [facility] No bed availability  [place] [phone] [facility] No bed availability  [facility] [place] Delia Heady [phone] [place] No bed availability    Chalmers Guest. Guerry Bruin, MSW, LCSW, [place] 05/26/2020 4:00 PM

## 2020-05-26 NOTE — BHH Counselor (Signed)
CSW attempted to return call to Evern Core of [place] at [phone]. Unable to make contact and HIPPA compliant voicemail left.   Chalmers Guest. Guerry Bruin, MSW, [place], [place] 05/26/2020 4:04 PM

## 2020-05-26 NOTE — Progress Notes (Signed)
1:1 Observation note 1900-2300 Patient pleasant and cooperative. Denies all. In dayroom most of evening watching game. No aggressive behavior noted. Redirectable. Denies SI, HI, AVH. Food and nutrition provided as well as supplement. Pt continued to request food.  Pt given prn for sleep with good relief. Encouragement and support provided. Safety checks maintained. Medications given as prescribed. Pt remains safe on unit in no distress.

## 2020-05-26 NOTE — BHH Counselor (Signed)
CSW contacted the following group homes to inquire about availability and referral. CSW sent referrals to [facility], and [facility]. All others listed were unable to accommodate patient.    [facility], [facility] Irmo Cullen [phone]   [facility] [place] [phone]  [facility] [place] [phone]  [facility] [place] [phone]  [facility]  [place] [phone]  [place] [phone]  [facility] [phone]  [place] [phone]  [place] [phone]  Irean Hong [phone]  [facility] Oren Bracket [phone] [place] [phone]    Signed:  Durenda Hurt, MSW, [place], LCASA 05/26/2020 4:34 PM
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
## 2020-05-26 NOTE — Progress Notes (Signed)
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 male patient with history of schizoaffective disorder, IDD, admitted with with agitation and difficulty controlling his behavior.  Interval History Patient was seen today for re-evaluation.  Nursing reports no events overnight. Patient has been medication compliant. Patient needs a lot of redirections, although he was not agitated or aggressive.  Subjective:  Patient seen this morning. He remains fixated on discharge, Pop Tarts, and other forms of food. He is intrusive, but easily redirectable. He has no physical or mental complaints. Denies SI/HI/AH/VH. Search for placement continues, see socail work notes for full details.   Total Time spent with patient: 30 minutes  Past Psychiatric History: see H&P  Past Medical History:  Past Medical History:  Diagnosis Date  . Hypertension   . Intellectual disability   . Obsessive-compulsive disorder   . Schizo-affective schizophrenia [facility])     Past Surgical History:  Procedure Laterality Date  . COLONOSCOPY WITH PROPOFOL N/A 12/28/2017   Procedure: COLONOSCOPY WITH PROPOFOL;  Surgeon: Jonathon Bellows, MD;  Location: [facility] ENDOSCOPY;  Service: Gastroenterology;  Laterality: N/A;  . HYDROCELE EXCISION Bilateral 02/22/2018   Procedure: bilateral HYDROCELECTOMY ADULT;  Surgeon: Cleon Gustin, MD;  Location: [place];  Service: Urology;  Laterality: Bilateral;  . INSERTION OF ILIAC STENT Left 02/22/2018   Procedure: INSERTION LEFT SUPERIOR FEMORAL ARTERY USING 6MM X 5CM VIABHON STENT with mynx device closure on right femoral artery;  Surgeon:  Waynetta Sandy, MD;  Location: [place];  Service: Vascular;  Laterality: Left;  . KNEE ARTHROSCOPY    . SCROTAL EXPLORATION N/A 02/22/2018   Procedure: SCROTUM EXPLORATION;  Surgeon: Cleon Gustin, MD;  Location: [place];  Service: Urology;  Laterality: N/A;  . UPPER EXTREMITY ANGIOGRAM Left 02/22/2018   Procedure: left lower EXTREMITY ANGIOGRAM;  Surgeon: Waynetta Sandy, MD;  Location: [facility] OR;  Service: Vascular;  Laterality: Left;   Family History:  Family History  Problem Relation Age of Onset  . Diabetes Mother    Family Psychiatric  History: N/A Social History:  Social History   Substance and Sexual Activity  Alcohol Use No     Social History   Substance and Sexual Activity  Drug Use No    Social History   Socioeconomic History  . Marital status: Single    Spouse name: Not on file  . Number of children: Not on file  . Years of education: Not on file  . Highest education level: Not on file  Occupational History  . Occupation: Disabled  Tobacco Use  . Smoking status: Former Smoker    Types: Cigarettes  . Smokeless tobacco: Never Used  Vaping Use  . Vaping Use: Unknown  Substance and Sexual Activity  . Alcohol use: No  . Drug use: No  . Sexual activity: Never  Other Topics Concern  . Not on file  Social History Narrative   ** Merged History Encounter **       Social Determinants of Health   Financial Resource Strain: Not on file  Food Insecurity: Not on file  Transportation Needs: Not on file  Physical Activity: Not on file  Stress: Not on file  Social Connections: Not on file   Additional Social History:                         Sleep: Good  Appetite:  Good  Current Medications: Current Facility-Administered Medications  Medication Dose Route Frequency Provider Last Rate Last Admin  . acetaminophen (TYLENOL) tablet 650 mg  650 mg Oral Q6H PRN [name], John T, MD      . albuterol (VENTOLIN HFA) 108 (90 Base) MCG/ACT  inhaler 2 puff  2 puff Inhalation Q4H PRN [name], John T, MD      . alum & mag hydroxide-simeth (MAALOX/MYLANTA) 200-200-20 MG/5ML suspension 30 mL  30 mL Oral Q4H PRN [name], [name], MD   30 mL at 05/23/20 2250  . benztropine (COGENTIN) tablet 0.5 mg  0.5 mg Oral BID [name], John T, MD   0.5 mg at 05/26/20 0945  . chlorproMAZINE (THORAZINE) injection 50 mg  50 mg Intramuscular TID PRN [name], John T, MD   50 mg at 05/19/20 1256   And  . diphenhydrAMINE (BENADRYL) injection 25 mg  25 mg Intramuscular TID PRN [name], [name], MD   25 mg at 05/19/20 1300  . clopidogrel (PLAVIX) tablet 75 mg  75 mg Oral Daily Salley Scarlet, MD   75 mg at 05/26/20 0953  . cloZAPine (CLOZARIL) tablet 200 mg  200 mg Oral Daily Salley Scarlet, MD   200 mg at 05/26/20 0945  . desmopressin (DDAVP) tablet 0.2 mg  0.2 mg Oral QHS Selina Cooley M, MD   0.2 mg at 05/25/20 2057  . haloperidol lactate (HALDOL) injection 5 mg  5 mg Intramuscular QID PRN Rulon Sera, MD   5 mg at 05/19/20 1440   And  . LORazepam (ATIVAN) injection 2 mg  2 mg Intramuscular Q4H PRN Rulon Sera, MD   2 mg at 05/19/20 1440   And  . diphenhydrAMINE (BENADRYL) injection 50 mg  50 mg Intramuscular Q4H PRN Rulon Sera, MD   50 mg at 05/18/20 1229  . docusate sodium (COLACE) capsule 100 mg  100 mg Oral BID PRN [name], [name], MD   100 mg at 05/17/20 0803  . ferrous sulfate tablet 325 mg  325 mg Oral Q breakfast [name], [name], MD   325 mg at 05/26/20 0947  . food thickener (THICK IT) powder   Oral PRN [name], John T, MD      . haloperidol (HALDOL) tablet 10 mg  10 mg Oral BID [name], [name], MD   10 mg at 05/26/20 0945  . haloperidol lactate (HALDOL) injection 5 mg  5 mg Intramuscular Once Rulon Sera, MD      . hydrocerin (EUCERIN) cream   Topical BID Salley Scarlet, MD   1 application at 21/30/86 1632  . LORazepam (ATIVAN) tablet 1 mg  1 mg Oral BID Salley Scarlet, MD   1 mg at 05/26/20 5784  . magnesium hydroxide (MILK OF  MAGNESIA) suspension 30 mL  30 mL Oral Daily PRN [name], John T, MD      . melatonin tablet 5 mg  5 mg Oral QHS PRN [name], [name], MD   5 mg at 05/16/20 2113  . midodrine (PROAMATINE) tablet 10 mg  10 mg Oral TID AC [name], [name], MD   10 mg at 05/26/20 1234  . neomycin-bacitracin-polymyxin (NEOSPORIN) ointment packet   Topical BID Salley Scarlet, MD   1  application at 35/45/62 0826  . nicotine (NICODERM CQ - dosed in mg/24 hours) patch 21 mg  21 mg Transdermal Daily [name], [name], MD   21 mg at 05/26/20 0957  . OLANZapine zydis (ZYPREXA) disintegrating tablet 10 mg  10 mg Oral QID PRN Rulon Sera, MD   10 mg at 05/25/20 0902  . polyethylene glycol (MIRALAX / GLYCOLAX) packet 17 g  17 g Oral Daily PRN [name], John T, MD      . traZODone (DESYREL) tablet 100 mg  100 mg Oral QHS PRN Caroline Sauger, NP   100 mg at 05/25/20 2057  . valproic acid (DEPAKENE) 250 MG/5ML solution 750 mg  750 mg Oral QID Salley Scarlet, MD      . zolpidem [facility]) tablet 5 mg  5 mg Oral QHS Salley Scarlet, MD   5 mg at 05/25/20 2056    Lab Results:  No results found for this or any previous visit (from the past 48 hour(s)).  Blood Alcohol level:  Lab Results  Component Value Date   ETH <10 04/30/2020   ETH <10 56/38/9373    Metabolic Disorder Labs: Lab Results  Component Value Date   HGBA1C 5.4 09/30/2016   MPG 108.28 09/30/2016   MPG 114 09/17/2016   Lab Results  Component Value Date   PROLACTIN 11.8 05/01/2020   Lab Results  Component Value Date   CHOL 135 09/30/2016   TRIG 104 02/25/2018   HDL 60 09/30/2016   CHOLHDL 2.3 09/30/2016   VLDL 12 09/30/2016   LDLCALC 63 09/30/2016    Physical Findings: AIMS: Facial and Oral Movements Muscles of Facial Expression: None, normal Lips and Perioral Area: None, normal Jaw: None, normal Tongue: Moderate,Extremity Movements Upper (arms, wrists, hands, fingers): None, normal Lower (legs, knees, ankles, toes): None, normal, Trunk  Movements Neck, shoulders, hips: None, normal, Overall Severity Severity of abnormal movements (highest score from questions above): None, normal Incapacitation due to abnormal movements: None, normal Patient's awareness of abnormal movements (rate only patient's report): No Awareness, Dental Status Current problems with teeth and/or dentures?: No Does patient usually wear dentures?: No  CIWA:    COWS:     Musculoskeletal: Strength & Muscle Tone: within normal limits Gait & Station: unsteady Patient leans: N/A  Psychiatric Specialty Exam:  Presentation  General Appearance: Casual  Eye Contact:Fair  Speech:Garbled  Speech Volume:Increased  Handedness:Right   Mood and Affect  Mood:Euthymic  Affect:Constricted   Thought Process  Thought Processes:Goal Directed  Descriptions of Associations:Loose  Orientation:-- (Oriented to person and place)  Thought Content:Perseveration  History of Schizophrenia/Schizoaffective disorder: Yes Duration of Psychotic Symptoms:Greater than 6 months Hallucinations: none Ideas of Reference:None  Suicidal Thoughts: denies Homicidal Thoughts: denies  Sensorium  Memory:Immediate Poor; Recent Poor; Remote Poor  Judgment:Impaired  Insight:Lacking   Executive Functions  Concentration:Poor  Attention Span:Poor  Recall:Poor  Fund of Knowledge:Poor  Language:Poor   Psychomotor Activity  Psychomotor Activity:Restless  Assets  Assets:Desire for Improvement; Financial Resources/Insurance; Social Support   Sleep  Sleep:Fair, 5.15 hours overnight, spent much of the morning napping as well   Physical Exam: Physical Exam  ROS  Blood pressure 129/70, pulse (!) 115, temperature 98 F (36.7 C), temperature source Oral, resp. rate 18, height 5\' 11"  (1.803 m), weight 75.8 kg, SpO2 100 %. Body mass index is 23.29 kg/m.   Treatment Plan Summary: Daily contact with patient to assess and evaluate symptoms and progress in  treatment and Medication management  Patient is a 60 year old  male with the above-stated past psychiatric history who is seen in follow-up.  Chart reviewed. Patient discussed with nursing. Patient remains at his mental and behavioral baseline.   Plan: -continue inpatient psych admission;  daily contact with patient to assess and evaluate symptoms and progress in treatment; psychoeducation.  -medications:  Schizoaffective disorder, bipolar type; moderate ID (IQ 48)- behavioral disturbance, agitation - Continue Clozaril 200 mg QHS, ANC 4000 on 4/1, weekly CBC for ANC checks, next due 4/6.  -Valproic acid level 58 on Depakote 750 mg four times daily. Patient appears to be at his baseline so will not increase despite being mildly subtherapeutic  - Continue Haldol 10 mg BID - Ativan 1 mg BID - Ambien 5 mg QHS   Mild asthma without complication- established problem, controlled - Albutrol PRN  Slow transit constipation- established problem, controlled  - Colace 100 mg BID, milk of magnesia PRN  Orthostatic hypotension- established problem, controlled - Midodrine 10 mg TID before meals  Tobacco Use disorder - Nicoderm 21 mg patch  -continue PRN medications.  acetaminophen, albuterol, alum & mag hydroxide-simeth, chlorproMAZINE (THORAZINE) injection **AND** diphenhydrAMINE, haloperidol lactate **AND** LORazepam **AND** diphenhydrAMINE, docusate sodium, food thickener, magnesium hydroxide, melatonin, OLANZapine zydis, polyethylene glycol, traZODone  -Pertinent Labs: no new labs ordered today; ANC 4000 on 4/1, weekly CBC for ANC checks, next due 4/6.    -Consults: No new consults placed since yesterday    -Disposition: patient is awaiting placement to a facility. All necessary aftercare will be arranged prior to discharge.  -  I certify that the patient does need, on a daily basis, active treatment furnished directly by or requiring the supervision of inpatient psychiatric facility  personnel.   05/26/20: Psychiatric exam above reviewed and remains accurate. Assessment and plan above reviewed and updated.    Salley Scarlet, MD 05/26/2020, 2:01 PM

## 2020-05-26 NOTE — Progress Notes (Signed)
Recreation Therapy Notes  Date: 05/26/2020  Time: 9:30 am  Location: Craft room   Behavioral response: Appropriate   Intervention Topic: Goals   Discussion/Intervention:  Group content on today was focused on goals. Patients described what goals are and how they define goals. Individuals expressed how they go about setting goals and reaching them. The group identified how important goals are and if they make short term goals to reach long term goals. Patients described how many goals they work on at a time and what affects them not reaching their goal. Individuals described how much time they put into planning and obtaining their goals. The group participated in the intervention "My Goal Board" and made personal goal boards to help them achieve their goal. Clinical Observations/Feedback:  Patient came to group and was focused on what peers and staff had to say about goals. He stated that his goal is to make it home because he is ready to go home. Individual left group early to return to his room.  Austin Gates LRT/CTRS            Samanthan Dugo 05/26/2020 11:32 AM

## 2020-05-27 LAB — CBC WITH DIFFERENTIAL/PLATELET
Abs Immature Granulocytes: 0.17 10*3/uL — ABNORMAL HIGH (ref 0.00–0.07)
Basophils Absolute: 0 10*3/uL (ref 0.0–0.1)
Basophils Relative: 1 %
Eosinophils Absolute: 0.1 10*3/uL (ref 0.0–0.5)
Eosinophils Relative: 3 %
HCT: 34.5 % — ABNORMAL LOW (ref 39.0–52.0)
Hemoglobin: 10.8 g/dL — ABNORMAL LOW (ref 13.0–17.0)
Immature Granulocytes: 4 %
Lymphocytes Relative: 23 %
Lymphs Abs: 1 10*3/uL (ref 0.7–4.0)
MCH: 28.9 pg (ref 26.0–34.0)
MCHC: 31.3 g/dL (ref 30.0–36.0)
MCV: 92.2 fL (ref 80.0–100.0)
Monocytes Absolute: 0.5 10*3/uL (ref 0.1–1.0)
Monocytes Relative: 12 %
Neutro Abs: 2.4 10*3/uL (ref 1.7–7.7)
Neutrophils Relative %: 57 %
Platelets: 183 10*3/uL (ref 150–400)
RBC: 3.74 MIL/uL — ABNORMAL LOW (ref 4.22–5.81)
RDW: 15.2 % (ref 11.5–15.5)
WBC: 4.2 10*3/uL (ref 4.0–10.5)
nRBC: 1 % — ABNORMAL HIGH (ref 0.0–0.2)

## 2020-05-27 NOTE — Progress Notes (Signed)
Patient is animated on assessment. He denies SI, HI, and AVH. Patient is excited by the news of potential group home placement and is preoccupied with leaving. Patient took all his medications with no issues.   Patient remains safe on the unit with a 1:1 sitter while awake.

## 2020-05-27 NOTE — B[facility] Group Notes (Signed)
LCSW Group Therapy Note  05/27/2020 2:41 PM  Type of Therapy/Topic:  Group Therapy:  Emotion Regulation  Participation Level:  Did Not Attend   Description of Group:   The purpose of this group is to assist patients in learning to regulate negative emotions and experience positive emotions. Patients will be guided to discuss ways in which they have been vulnerable to their negative emotions. These vulnerabilities will be juxtaposed with experiences of positive emotions or situations, and patients will be challenged to use positive emotions to combat negative ones. Special emphasis will be placed on coping with negative emotions in conflict situations, and patients will process healthy conflict resolution skills.  Therapeutic Goals: 1. Patient will identify two positive emotions or experiences to reflect on in order to balance out negative emotions 2. Patient will label two or more emotions that they find the most difficult to experience 3. Patient will demonstrate positive conflict resolution skills through discussion and/or role plays  Summary of Patient Progress: Patient did not attend group despite encouraged participation.     Therapeutic Modalities:   Cognitive Behavioral Therapy Feelings Identification Dialectical Behavioral Therapy  Paulla Dolly, MSW, [place], [place] 05/27/2020 2:41 PM

## 2020-05-27 NOTE — Progress Notes (Signed)
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 male patient with history of schizoaffective disorder, IDD, admitted with with agitation and difficulty controlling his behavior.  Interval History Patient was seen today for re-evaluation.  Nursing reports no events overnight. Patient has been medication compliant. Patient needs a lot of redirections, although he was not agitated or aggressive.  Subjective:  Patient seen this morning. He continues to remain fixated on discharge. No aggression noted, but requires frequent redirection. Denies SI/HI/AH/VH. Appears at baseline. Search for placement is ongoing, see social work notes for full details.    Total Time spent with patient: 20 minutes  Past Psychiatric History: see H&P  Past Medical History:  Past Medical History:  Diagnosis Date  . Hypertension   . Intellectual disability   . Obsessive-compulsive disorder   . Schizo-affective schizophrenia [facility])     Past Surgical History:  Procedure Laterality Date  . COLONOSCOPY WITH PROPOFOL N/A 12/28/2017   Procedure: COLONOSCOPY WITH PROPOFOL;  Surgeon: Jonathon Bellows, MD;  Location: [facility] ENDOSCOPY;  Service: Gastroenterology;  Laterality: N/A;  . HYDROCELE EXCISION Bilateral 02/22/2018   Procedure: bilateral HYDROCELECTOMY ADULT;  Surgeon: Cleon Gustin, MD;  Location: [place];  Service: Urology;  Laterality: Bilateral;  . INSERTION OF ILIAC STENT Left 02/22/2018   Procedure: INSERTION LEFT SUPERIOR FEMORAL ARTERY USING 6MM X 5CM VIABHON STENT with mynx device closure on right femoral artery;  Surgeon: Waynetta Sandy,  MD;  Location: [place];  Service: Vascular;  Laterality: Left;  . KNEE ARTHROSCOPY    . SCROTAL EXPLORATION N/A 02/22/2018   Procedure: SCROTUM EXPLORATION;  Surgeon: Cleon Gustin, MD;  Location: [place];  Service: Urology;  Laterality: N/A;  . UPPER EXTREMITY ANGIOGRAM Left 02/22/2018   Procedure: left lower EXTREMITY ANGIOGRAM;  Surgeon: Waynetta Sandy, MD;  Location: [facility] OR;  Service: Vascular;  Laterality: Left;   Family History:  Family History  Problem Relation Age of Onset  . Diabetes Mother    Family Psychiatric  History: N/A Social History:  Social History   Substance and Sexual Activity  Alcohol Use No     Social History   Substance and Sexual Activity  Drug Use No    Social History   Socioeconomic History  . Marital status: Single    Spouse name: Not on file  . Number of children: Not on file  . Years of education: Not on file  . Highest education level: Not on file  Occupational History  . Occupation: Disabled  Tobacco Use  . Smoking status: Former Smoker    Types: Cigarettes  . Smokeless tobacco: Never Used  Vaping Use  . Vaping Use: Unknown  Substance and Sexual Activity  . Alcohol use: No  . Drug use: No  . Sexual activity: Never  Other Topics Concern  . Not on file  Social History Narrative   ** Merged History Encounter **       Social Determinants of Health   Financial Resource Strain: Not on file  Food Insecurity: Not on file  Transportation Needs: Not on file  Physical Activity: Not on file  Stress: Not on file  Social Connections: Not on file   Additional Social History:                         Sleep: Good  Appetite:  Good  Current Medications: Current Facility-Administered Medications  Medication Dose Route Frequency Provider Last Rate Last Admin  . acetaminophen (TYLENOL) tablet 650 mg  650 mg Oral Q6H PRN [name], John T, MD      . albuterol (VENTOLIN HFA) 108 (90 Base) MCG/ACT inhaler 2 puff  2 puff  Inhalation Q4H PRN [name], John T, MD      . alum & mag hydroxide-simeth (MAALOX/MYLANTA) 200-200-20 MG/5ML suspension 30 mL  30 mL Oral Q4H PRN [name], John T, MD   30 mL at 05/26/20 1447  . benztropine (COGENTIN) tablet 0.5 mg  0.5 mg Oral BID [name], John T, MD   0.5 mg at 05/27/20 0930  . chlorproMAZINE (THORAZINE) injection 50 mg  50 mg Intramuscular TID PRN [name], John T, MD   50 mg at 05/19/20 1256   And  . diphenhydrAMINE (BENADRYL) injection 25 mg  25 mg Intramuscular TID PRN [name], [name], MD   25 mg at 05/19/20 1300  . clopidogrel (PLAVIX) tablet 75 mg  75 mg Oral Daily Salley Scarlet, MD   75 mg at 05/27/20 0930  . cloZAPine (CLOZARIL) tablet 200 mg  200 mg Oral Daily Salley Scarlet, MD   200 mg at 05/27/20 0930  . desmopressin (DDAVP) tablet 0.2 mg  0.2 mg Oral QHS Selina Cooley M, MD   0.2 mg at 05/26/20 2134  . haloperidol lactate (HALDOL) injection 5 mg  5 mg Intramuscular QID PRN Rulon Sera, MD   5 mg at 05/19/20 1440   And  . LORazepam (ATIVAN) injection 2 mg  2 mg Intramuscular Q4H PRN Rulon Sera, MD   2 mg at 05/19/20 1440   And  . diphenhydrAMINE (BENADRYL) injection 50 mg  50 mg Intramuscular Q4H PRN Rulon Sera, MD   50 mg at 05/18/20 1229  . docusate sodium (COLACE) capsule 100 mg  100 mg Oral BID PRN [name], [name], MD   100 mg at 05/17/20 0803  . ferrous sulfate tablet 325 mg  325 mg Oral Q breakfast [name], [name], MD   325 mg at 05/27/20 0930  . food thickener (THICK IT) powder   Oral PRN [name], John T, MD      . haloperidol (HALDOL) tablet 10 mg  10 mg Oral BID [name], John T, MD   10 mg at 05/27/20 0930  . haloperidol lactate (HALDOL) injection 5 mg  5 mg Intramuscular Once Rulon Sera, MD      . hydrocerin (EUCERIN) cream   Topical BID Salley Scarlet, MD   Given at 05/27/20 [phone]  . LORazepam (ATIVAN) tablet 1 mg  1 mg Oral BID Salley Scarlet, MD   1 mg at 05/27/20 0930  . magnesium hydroxide (MILK OF MAGNESIA) suspension 30 mL  30 mL Oral  Daily PRN [name], John T, MD      . melatonin tablet 5 mg  5 mg Oral QHS PRN [name], [name], MD   5 mg at 05/26/20 2134  . midodrine (PROAMATINE) tablet 10 mg  10 mg Oral TID AC [name], John T, MD   10 mg at 05/27/20 0930  . neomycin-bacitracin-polymyxin (NEOSPORIN) ointment packet   Topical BID Salley Scarlet, MD   1 application at 61/60/73 [phone]  .  nicotine (NICODERM CQ - dosed in mg/24 hours) patch 21 mg  21 mg Transdermal Daily [name], [name], MD   21 mg at 05/27/20 0933  . OLANZapine zydis (ZYPREXA) disintegrating tablet 10 mg  10 mg Oral QID PRN Rulon Sera, MD   10 mg at 05/25/20 0902  . polyethylene glycol (MIRALAX / GLYCOLAX) packet 17 g  17 g Oral Daily PRN [name], John T, MD      . traZODone (DESYREL) tablet 100 mg  100 mg Oral QHS PRN Caroline Sauger, NP   100 mg at 05/25/20 2057  . valproic acid (DEPAKENE) 250 MG/5ML solution 750 mg  750 mg Oral QID Salley Scarlet, MD   750 mg at 05/27/20 0962  . zolpidem (AMBIEN) tablet 5 mg  5 mg Oral QHS Salley Scarlet, MD   5 mg at 05/26/20 2134    Lab Results:  Results for orders placed or performed during the hospital encounter of 05/07/20 (from the past 48 hour(s))  CBC with Differential/Platelet     Status: Abnormal   Collection Time: 05/27/20  6:42 AM  Result Value Ref Range   WBC 4.2 4.0 - 10.5 K/uL   RBC 3.74 (L) 4.22 - 5.81 MIL/uL   Hemoglobin 10.8 (L) 13.0 - 17.0 g/dL   HCT 34.5 (L) 39.0 - 52.0 %   MCV 92.2 80.0 - 100.0 fL   MCH 28.9 26.0 - 34.0 pg   MCHC 31.3 30.0 - 36.0 g/dL   RDW 15.2 11.5 - 15.5 %   Platelets 183 150 - 400 K/uL   nRBC 1.0 (H) 0.0 - 0.2 %   Neutrophils Relative % 57 %   Neutro Abs 2.4 1.7 - 7.7 K/uL   Lymphocytes Relative 23 %   Lymphs Abs 1.0 0.7 - 4.0 K/uL   Monocytes Relative 12 %   Monocytes Absolute 0.5 0.1 - 1.0 K/uL   Eosinophils Relative 3 %   Eosinophils Absolute 0.1 0.0 - 0.5 K/uL   Basophils Relative 1 %   Basophils Absolute 0.0 0.0 - 0.1 K/uL   Immature Granulocytes 4 %    Abs Immature Granulocytes 0.17 (H) 0.00 - 0.07 K/uL    Comment: Performed at [facility], [address] [place], [place] [address]    Blood Alcohol level:  Lab Results  Component Value Date   [facility] <10 04/30/2020   ETH <10 94/76/5465    Metabolic Disorder Labs: Lab Results  Component Value Date   HGBA1C 5.4 09/30/2016   MPG 108.28 09/30/2016   MPG 114 09/17/2016   Lab Results  Component Value Date   PROLACTIN 11.8 05/01/2020   Lab Results  Component Value Date   CHOL 135 09/30/2016   TRIG 104 02/25/2018   HDL 60 09/30/2016   CHOLHDL 2.3 09/30/2016   VLDL 12 09/30/2016   LDLCALC 63 09/30/2016    Physical Findings: AIMS: Facial and Oral Movements Muscles of Facial Expression: None, normal Lips and Perioral Area: None, normal Jaw: None, normal Tongue: Moderate,Extremity Movements Upper (arms, wrists, hands, fingers): None, normal Lower (legs, knees, ankles, toes): None, normal, Trunk Movements Neck, shoulders, hips: None, normal, Overall Severity Severity of abnormal movements (highest score from questions above): None, normal Incapacitation due to abnormal movements: None, normal Patient's awareness of abnormal movements (rate only patient's report): No Awareness, Dental Status Current problems with teeth and/or dentures?: No Does patient usually wear dentures?: No  CIWA:    COWS:     Musculoskeletal: Strength & Muscle Tone: within  normal limits Gait & Station: unsteady Patient leans: N/A  Psychiatric Specialty Exam:  Presentation  General Appearance: Casual  Eye Contact:Fair  Speech:Garbled  Speech Volume:Increased  Handedness:Right   Mood and Affect  Mood:Euthymic  Affect:Constricted   Thought Process  Thought Processes:Goal Directed  Descriptions of Associations:Loose  Orientation:-- (Oriented to person and place)  Thought Content:Perseveration  History of Schizophrenia/Schizoaffective disorder: Yes Duration of Psychotic  Symptoms:Greater than 6 months Hallucinations: none Ideas of Reference:None  Suicidal Thoughts: denies Homicidal Thoughts: denies  Sensorium  Memory:Immediate Poor; Recent Poor; Remote Poor  Judgment:Impaired  Insight:Lacking   Executive Functions  Concentration:Poor  Attention Span:Poor  Recall:Poor  Fund of Knowledge:Poor  Language:Poor   Psychomotor Activity  Psychomotor Activity:Restless  Assets  Assets:Desire for Improvement; Financial Resources/Insurance; Social Support   Sleep  Sleep:Fair, 5.15 hours overnight, spent much of the morning napping as well   Physical Exam: Physical Exam  ROS  Blood pressure (!) 112/93, pulse (!) 111, temperature 97.9 F (36.6 C), temperature source Oral, resp. rate 16, height 5\' 11"  (1.803 m), weight 75.8 kg, SpO2 97 %. Body mass index is 23.29 kg/m.   Treatment Plan Summary: Daily contact with patient to assess and evaluate symptoms and progress in treatment and Medication management  Patient is a 60 year old male with the above-stated past psychiatric history who is seen in follow-up.  Chart reviewed. Patient discussed with nursing. Patient remains at his mental and behavioral baseline.   Plan: -continue inpatient psych admission;  daily contact with patient to assess and evaluate symptoms and progress in treatment; psychoeducation.  -medications:  Schizoaffective disorder, bipolar type; moderate ID (IQ 48)- behavioral disturbance, agitation - Continue Clozaril 200 mg QHS, ANC 2562 today , weekly CBC for ANC checks, next due 4/13.  -Valproic acid level 58 on Depakote 750 mg four times daily. Patient appears to be at his baseline so will not increase despite being mildly subtherapeutic  - Continue Haldol 10 mg BID - Ativan 1 mg BID - Ambien 5 mg QHS   Mild asthma without complication- established problem, controlled - Albutrol PRN  Slow transit constipation- established problem, controlled  - Colace 100 mg  BID, milk of magnesia PRN  Orthostatic hypotension- established problem, controlled - Midodrine 10 mg TID before meals  Tobacco Use disorder - Nicoderm 21 mg patch  -continue PRN medications.  acetaminophen, albuterol, alum & mag hydroxide-simeth, chlorproMAZINE (THORAZINE) injection **AND** diphenhydrAMINE, haloperidol lactate **AND** LORazepam **AND** diphenhydrAMINE, docusate sodium, food thickener, magnesium hydroxide, melatonin, OLANZapine zydis, polyethylene glycol, traZODone  -Pertinent Labs: no new labs ordered today; [place] on 4/6, weekly CBC for ANC checks, next due 4/13.    -Consults: No new consults placed since yesterday    -Disposition: patient is awaiting placement to a facility. All necessary aftercare will be arranged prior to discharge.  -  I certify that the patient does need, on a daily basis, active treatment furnished directly by or requiring the supervision of inpatient psychiatric facility personnel.   05/27/20: Psychiatric exam above reviewed and remains accurate. Assessment and plan above reviewed and updated.     Salley Scarlet, MD 05/27/2020, 9:59 AM

## 2020-05-27 NOTE — Progress Notes (Signed)
Patient ate lunch and is resting in bed. Patient is not observed to be in any distress and remains on 1:1 while awake.

## 2020-05-27 NOTE — Progress Notes (Signed)
Patient on 1:1 for safety with a sitter present. Pt given education, support, and encouragement to be active in his treatment plan. Pt redirectable this evening and was compliant with medication administration per MD orders. Pt remains safe on the unit.

## 2020-05-27 NOTE — Progress Notes (Signed)
Patient soiled linens and took a shower. Linens were changed. Patient remains on 1:1 and is not observed to be in any distress.

## 2020-05-27 NOTE — Progress Notes (Signed)
Patient on 1:1 for safety with a sitter present. Patient is asleep and without complaint at this time. Pt remains safe on the unit.

## 2020-05-27 NOTE — Plan of Care (Signed)
Patient presents at his baseline   Problem: Education: Goal: Emotional status will improve Outcome: Not Progressing Goal: Mental status will improve Outcome: Not Progressing

## 2020-05-27 NOTE — BHH Counselor (Addendum)
ADDENDUM  CSW contacted [name], legal guardian, in order to update her on group home acceptance and process. Patient progress was shared with guardian.  Signed:  Durenda Hurt, MSW, [place], LCASA 05/27/2020 3:32 PM  ADDENDUM  CSW notified Zipporah Plants, [facility] Coordinator, and informed him of group home interested in patient. He will contact group home owner to coordinate admission and funding.   Signed:  Durenda Hurt, MSW, [place], LCASA 05/27/2020 11:53 AM  CSW received call from [facility] IDD group home owner Dr. Lanora Manis [phone]) who was interested in accepting patient. CSW discussed accepting process and will contact Scientist, research (medical) and legal guardian.   Signed:  Durenda Hurt, MSW, [place], LCASA 05/27/2020 9:51 AM
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
## 2020-05-28 MED ORDER — VALPROIC ACID 250 MG/5ML PO SOLN
750.0000 mg | Freq: Four times a day (QID) | ORAL | Status: DC
  Administered 2020-05-28 – 2020-06-15 (×73): 750 mg via ORAL
  Filled 2020-05-28 (×80): qty 15

## 2020-05-28 NOTE — Progress Notes (Signed)
BRIEF PHARMACY NOTE   This patient attended and participated in Medication Management Group counseling led by [facility] staff pharmacist.  Patient behavior was appropriate for group setting, though session was cut short due to single participant attendance.   Educational materials sourced from:  "Medication Do's and Don'ts" from [email or web address]   "Mental Health Medications" from [email or web address] [place], PharmD, BCPS Clinical Pharmacist 05/28/2020 2:19 PM

## 2020-05-28 NOTE — Plan of Care (Signed)
Patient presents at his baseline   Problem: Education: Goal: Emotional status will improve Outcome: Not Progressing Goal: Mental status will improve Outcome: Not Progressing

## 2020-05-28 NOTE — [facility] Counselor (Signed)
CSW followed up regarding referrals with the following results  [facility]: Will follow up on Friday.   [facility]: No voicemail set up.  [facility]): left message to have him call CSW back.   [facility]: Stated that pt would not be a "good fit" with the clients that she has currently.  [facility]: Told to call back tomorrow as the manager is not there but also left contact information for follow up.  [facility]: told to forward to her at this email: [email or web address]  Information was forwarded to H. Humphrey-Greer.   Chalmers Guest. Guerry Bruin, MSW, [place], [place] 05/28/2020 3:59 PM

## 2020-05-28 NOTE — Plan of Care (Signed)
Pt denies depression, anxiety, SI, HI and AVH. Pt was educated on care plan and verbalizes understanding. Pt has a 1:1. Pt is calm, cooperative and med compliant. [name] RN  Problem: Education: Goal: Knowledge of [place] [facility] Education information/materials will improve Outcome: Progressing Goal: Emotional status will improve Outcome: Progressing Goal: Mental status will improve Outcome: Progressing Goal: Verbalization of understanding the information provided will improve Outcome: Progressing   Problem: Activity: Goal: Interest or engagement in activities will improve Outcome: Progressing Goal: Sleeping patterns will improve Outcome: Progressing   Problem: Coping: Goal: Ability to verbalize frustrations and anger appropriately will improve Outcome: Progressing Goal: Ability to demonstrate self-control will improve Outcome: Progressing   Problem: Health Behavior/Discharge Planning: Goal: Identification of resources available to assist in meeting health care needs will improve Outcome: Progressing Goal: Compliance with treatment plan for underlying cause of condition will improve Outcome: Progressing   Problem: Physical Regulation: Goal: Ability to maintain clinical measurements within normal limits will improve Outcome: Progressing   Problem: Safety: Goal: Periods of time without injury will increase Outcome: Progressing   Problem: Education: Goal: Ability to verbalize precipitating factors for violent behavior will improve Outcome: Progressing   Problem: Coping: Goal: Ability to verbalize frustrations and anger appropriately will improve Outcome: Progressing   Problem: Health Behavior/Discharge Planning: Goal: Ability to implement measures to prevent violent behavior in the future will improve Outcome: Progressing   Problem: Safety: Goal: Ability to demonstrate self-control will improve Outcome: Progressing Goal: Ability to redirect hostility and  anger into socially appropriate behaviors will improve Outcome: Progressing

## 2020-05-28 NOTE — Progress Notes (Signed)
Patient on 1:1 for safety. Pt is asleep and without complaint at this time. Pt remains safe on the unit.

## 2020-05-28 NOTE — Tx Team (Signed)
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 acetaminophen (TYLENOL) tablet 650 mg  650 mg Oral Q6H PRN [name], John T, MD      . albuterol (VENTOLIN HFA) 108 (90 Base) MCG/ACT inhaler 2 puff  2 puff Inhalation Q4H PRN [name], John T, MD      . alum & mag hydroxide-simeth (MAALOX/MYLANTA) 200-200-20 MG/5ML suspension 30 mL  30 mL Oral Q4H PRN [name], John T, MD   30 mL at 05/26/20 1447  . benztropine (COGENTIN) tablet 0.5 mg  0.5 mg Oral BID [name], [name], MD   0.5 mg at 05/28/20 0925  . chlorproMAZINE (THORAZINE) injection 50 mg  50 mg Intramuscular TID PRN [name], John T, MD   50 mg at 05/19/20 1256   And  . diphenhydrAMINE (BENADRYL) injection 25 mg  25 mg Intramuscular TID PRN [name], [name], MD   25 mg at 05/19/20 1300  . clopidogrel (PLAVIX) tablet 75 mg  75 mg Oral Daily Salley Scarlet, MD   75 mg at 05/28/20 5427  . cloZAPine (CLOZARIL) tablet 200 mg  200 mg Oral Daily Salley Scarlet, MD   200 mg at 05/28/20 0925  . desmopressin (DDAVP) tablet 0.2 mg  0.2 mg Oral QHS Selina Cooley M, MD   0.2 mg at 05/27/20 2111  . haloperidol lactate (HALDOL) injection 5 mg  5 mg Intramuscular QID PRN Rulon Sera, MD   5 mg at 05/19/20 1440   And  . LORazepam (ATIVAN) injection 2 mg  2 mg Intramuscular Q4H PRN Rulon Sera, MD   2 mg at 05/19/20 1440   And  . diphenhydrAMINE (BENADRYL) injection 50 mg  50 mg Intramuscular Q4H PRN Rulon Sera, MD   50 mg at  05/18/20 1229  . docusate sodium (COLACE) capsule 100 mg  100 mg Oral BID PRN [name], [name], MD   100 mg at 05/17/20 0803  . ferrous sulfate tablet 325 mg  325 mg Oral Q breakfast [name], [name], MD   325 mg at 05/28/20 0924  . food thickener (THICK IT) powder   Oral PRN [name], John T, MD      . haloperidol (HALDOL) tablet 10 mg  10 mg Oral BID [name], [name], MD   10 mg at 05/28/20 0623  . haloperidol lactate (HALDOL) injection 5 mg  5 mg Intramuscular Once Rulon Sera, MD      . hydrocerin (EUCERIN) cream   Topical BID Salley Scarlet, MD   1 application at 76/28/31 1633  . LORazepam (ATIVAN) tablet 1 mg  1 mg Oral BID Salley Scarlet, MD   1 mg at 05/28/20 5176  . magnesium hydroxide (MILK OF MAGNESIA) suspension 30 mL  30 mL Oral Daily PRN [name], John T, MD      . melatonin tablet 5 mg  5 mg Oral QHS PRN [name], [name], MD   5 mg at 05/27/20 2110  . midodrine (PROAMATINE) tablet 10 mg  10 mg Oral TID AC [name], [name], MD  10 mg at 05/28/20 0925  . neomycin-bacitracin-polymyxin (NEOSPORIN) ointment packet   Topical BID Salley Scarlet, MD   1 application at 75/10/25 1633  . nicotine (NICODERM CQ - dosed in mg/24 hours) patch 21 mg  21 mg Transdermal Daily [name], [name], MD   21 mg at 05/28/20 0929  . OLANZapine zydis (ZYPREXA) disintegrating tablet 10 mg  10 mg Oral QID PRN Rulon Sera, MD   10 mg at 05/25/20 0902  . polyethylene glycol (MIRALAX / GLYCOLAX) packet 17 g  17 g Oral Daily PRN [name], John T, MD      . traZODone (DESYREL) tablet 100 mg  100 mg Oral QHS PRN Caroline Sauger, NP   100 mg at 05/25/20 2057  . valproic acid (DEPAKENE) 250 MG/5ML solution 750 mg  750 mg Oral QID Salley Scarlet, MD   750 mg at 05/27/20 2111  . zolpidem (AMBIEN) tablet 5 mg  5 mg Oral QHS Salley Scarlet, MD   5 mg at 05/27/20 2110   PTA Medications: Medications Prior to Admission  Medication Sig Dispense Refill Last Dose  . benztropine (COGENTIN) 0.5 MG tablet Take 0.5 mg by  mouth 2 (two) times daily.   05/07/2020 at Unknown time  . divalproex (DEPAKOTE) 500 MG DR tablet Take 500 mg by mouth 3 (three) times daily.     . Ensure (ENSURE) Take 237 mLs by mouth in the morning and at bedtime.     . ferrous sulfate 325 (65 FE) MG tablet Take 325 mg by mouth daily with breakfast.     . food thickener (THICK IT) POWD Take by mouth in the morning, at noon, and at bedtime.     Marland Kitchen lactulose (CHRONULAC) 10 GM/15ML solution Take 20 g by mouth daily.     . melatonin 3 MG TABS tablet Take 3 mg by mouth at bedtime as needed (sleep).     . midodrine (PROAMATINE) 10 MG tablet Take 10 mg by mouth 3 (three) times daily.       Patient Stressors: Health problems Marital or family conflict  Patient Strengths: Supportive family/friends  Treatment Modalities: Medication Management, Group therapy, Case management,  1 to 1 session with clinician, Psychoeducation, Recreational therapy.   Physician Treatment Plan for Primary Diagnosis: Schizoaffective disorder, bipolar type ([place]) Long Term Goal(s): Improvement in symptoms so as ready for discharge Improvement in symptoms so as ready for discharge   Short Term Goals: Ability to identify changes in lifestyle to reduce recurrence of condition will improve Ability to verbalize feelings will improve Ability to disclose and discuss suicidal ideas Ability to demonstrate self-control will improve Ability to identify and develop effective coping behaviors will improve Ability to maintain clinical measurements within normal limits will improve Compliance with prescribed medications will improve Ability to identify changes in lifestyle to reduce recurrence of condition will improve Ability to verbalize feelings will improve Ability to disclose and discuss suicidal ideas Ability to demonstrate self-control will improve Ability to identify and develop effective coping behaviors will improve Ability to maintain clinical measurements within normal  limits will improve Compliance with prescribed medications will improve Ability to identify triggers associated with substance abuse/mental health issues will improve  Medication Management: Evaluate patient's response, side effects, and tolerance of medication regimen.  Therapeutic Interventions: 1 to 1 sessions, Unit Group sessions and Medication administration.  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   Hypotension   Mild asthma without complication   Slow transit constipation  Long Term Goal(s): Improvement in symptoms so as ready for discharge Improvement in symptoms so as ready for discharge   Short Term Goals: Ability to identify changes in lifestyle to reduce recurrence of condition will improve Ability to verbalize feelings will improve Ability to disclose and discuss suicidal ideas Ability to demonstrate self-control will improve Ability to identify and develop effective coping behaviors will improve Ability to maintain clinical measurements within normal limits will improve Compliance with prescribed medications will improve Ability to identify changes in lifestyle to reduce recurrence of condition will improve Ability to verbalize feelings will improve Ability to disclose and discuss suicidal ideas Ability to demonstrate self-control will improve Ability to identify and develop effective coping behaviors will improve Ability to maintain clinical measurements within normal limits will improve Compliance with prescribed medications will improve Ability to identify triggers associated with substance abuse/mental health issues will improve     Medication Management: Evaluate patient's response, side effects, and tolerance of medication regimen.  Therapeutic Interventions: 1 to 1  sessions, Unit Group sessions and Medication administration.  Evaluation of Outcomes: Progressing   RN Treatment Plan for Primary Diagnosis: Schizoaffective disorder, bipolar type ([place]) Long Term Goal(s): Knowledge of disease and therapeutic regimen to maintain health will improve  Short Term Goals: Ability to remain free from injury will improve, Ability to verbalize frustration and anger appropriately will improve, Ability to demonstrate self-control, Ability to participate in decision making will improve, Ability to verbalize feelings will improve, Ability to identify and develop effective coping behaviors will improve and Compliance with prescribed medications will improve  Medication Management: RN will administer medications as ordered by provider, will assess and evaluate patient's response and provide education to patient for prescribed medication. RN will report any adverse and/or side effects to prescribing provider.  Therapeutic Interventions: 1 on 1 counseling sessions, Psychoeducation, Medication administration, Evaluate responses to treatment, Monitor vital signs and CBGs as ordered, Perform/monitor CIWA, COWS, AIMS and Fall Risk screenings as ordered, Perform wound care treatments as ordered.  Evaluation of Outcomes: Progressing   LCSW Treatment Plan for Primary Diagnosis: Schizoaffective disorder, bipolar type ([place]) Long Term Goal(s): Safe transition to appropriate next level of care at discharge, Engage patient in therapeutic group addressing interpersonal concerns.  Short Term Goals: Engage patient in aftercare planning with referrals and resources, Increase social support, Increase ability to appropriately verbalize feelings, Increase emotional regulation, Facilitate acceptance of mental health diagnosis and concerns, Identify triggers associated with mental health/substance abuse issues and Increase skills for wellness and recovery  Therapeutic Interventions: Assess for all  discharge needs, 1 to 1 time with Social worker, Explore available resources and support systems, Assess for adequacy in community support network, Educate family and significant other(s) on suicide prevention, Complete Psychosocial Assessment, Interpersonal group therapy.  Evaluation of Outcomes: Progressing   Progress in Treatment: Attending groups: Yes. Participating in groups: Yes. Taking medication as prescribed: Yes. Toleration medication: Yes. Family/Significant other contact made: Yes, individual(s) contacted:  [name], Guardian/Sister Patient understands diagnosis: No. Discussing patient identified problems/goals with staff: Yes. Medical problems stabilized or resolved: Yes. Denies suicidal/homicidal ideation: Yes. Issues/concerns per patient self-inventory: No. Other: None  New problem(s) identified: No, Describe:  None  New Short Term/Long Term Goal(s): Elimination of symptoms of psychosis, medication management for mood stabilization; development of comprehensive mental wellness plan. Update 05/13/20: No changes at this time.  Update 05/18/20: No changes at this time. Update 05/23/20: No changes at this time. Update 05/28/20: No changes at this time.  Patient Goals: "I want to go home." Update 05/13/20: No changes at this time. Update 05/18/20: No changes at this time. Update 05/23/20: No changes at this time. Update 05/28/20: No changes at this time.   Discharge Plan or Barriers: CSW will assist with development of an appropriate discharge/aftercare plan. Patient will need placement and has a guardian. Update 05/13/20: Patients behavior has become increasingly aggressive. Referral has been made to [facility] and patient is on waiting list for [facility]. Update 05/18/20: Patient remains aggressive and isolated to back hall. Patient is on the wait list for [facility]. Update 05/23/20: Patient is no longer isolated to the back hallway. Update 05/28/20: Pt has potential group home  placement.    Reason for Continuation of Hospitalization: Aggression Medical Issues Medication stabilization  Estimated Length of Stay: TBD  Attendees: Patient: 05/28/2020 9:31 AM  Physician: Selina Cooley, MD 05/28/2020 9:31 AM  Nursing:  05/28/2020 9:31 AM  RN Care Manager: 05/28/2020 9:31 AM  Social Worker: Paulla Dolly, MSW, [place], [place] 05/28/2020 9:31 AM  Recreational Therapist:  05/28/2020 9:31 AM  Other: Kiva [place], MSW, LCSW-A 05/28/2020 9:31 AM  Other: Chalmers Guest. Guerry Bruin, MSW, [place], [place] 05/28/2020 9:31 AM  Other: 05/28/2020 9:31 AM    Scribe for Treatment Team: Shirl Harris, LCSW 05/28/2020 9:31 AM

## 2020-05-28 NOTE — Progress Notes (Signed)
Pt has been calm, cooperative and med compliant. Pt is pleasant. Pt attended all groups and went outdoors. Pt has been friendly and social. Pt is still on 1:1. [name] RN

## 2020-05-28 NOTE — Progress Notes (Signed)
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 male patient with history of schizoaffective disorder, IDD, admitted with with agitation and difficulty controlling his behavior.  Interval History Patient was seen today for re-evaluation.  Nursing reports no events overnight. Patient has been medication compliant. Patient needs a lot of redirections, although he was not agitated or aggressive.  Subjective:  Patient seen this morning. Remains at baseline. Fixated on discharge. Denies SI/HI/AH/VH. Requires verbal redirection, but no episodes of violence or agitation.    Total Time spent with patient: 20 minutes  Past Psychiatric History: see H&P  Past Medical History:  Past Medical History:  Diagnosis Date  . Hypertension   . Intellectual disability   . Obsessive-compulsive disorder   . Schizo-affective schizophrenia [facility])     Past Surgical History:  Procedure Laterality Date  . COLONOSCOPY WITH PROPOFOL N/A 12/28/2017   Procedure: COLONOSCOPY WITH PROPOFOL;  Surgeon: Jonathon Bellows, MD;  Location: [facility] ENDOSCOPY;  Service: Gastroenterology;  Laterality: N/A;  . HYDROCELE EXCISION Bilateral 02/22/2018   Procedure: bilateral HYDROCELECTOMY ADULT;  Surgeon: Cleon Gustin, MD;  Location: [place];  Service: Urology;  Laterality: Bilateral;  . INSERTION OF ILIAC STENT Left 02/22/2018   Procedure: INSERTION LEFT SUPERIOR FEMORAL ARTERY USING 6MM X 5CM VIABHON STENT with mynx device closure on right femoral artery;  Surgeon: Waynetta Sandy, MD;  Location: [place];  Service: Vascular;  Laterality: Left;  . KNEE ARTHROSCOPY     . SCROTAL EXPLORATION N/A 02/22/2018   Procedure: SCROTUM EXPLORATION;  Surgeon: Cleon Gustin, MD;  Location: [place];  Service: Urology;  Laterality: N/A;  . UPPER EXTREMITY ANGIOGRAM Left 02/22/2018   Procedure: left lower EXTREMITY ANGIOGRAM;  Surgeon: Waynetta Sandy, MD;  Location[facility] OR;  Service: Vascular;  Laterality: Left;   Family History:  Family History  Problem Relation Age of Onset  . Diabetes Mother    Family Psychiatric  History: N/A Social History:  Social History   Substance and Sexual Activity  Alcohol Use No     Social History   Substance and Sexual Activity  Drug Use No    Social History   Socioeconomic History  . Marital status: Single    Spouse name: Not on file  . Number of children: Not on file  . Years of education: Not on file  . Highest education level: Not on file  Occupational History  . Occupation: Disabled  Tobacco Use  . Smoking status: Former Smoker    Types: Cigarettes  . Smokeless tobacco: Never Used  Vaping Use  . Vaping Use: Unknown  Substance and Sexual Activity  . Alcohol use: No  . Drug use: No  . Sexual activity: Never  Other Topics Concern  . Not on file  Social History Narrative   ** Merged History Encounter **       Social Determinants of Health   Financial Resource Strain: Not on file  Food Insecurity: Not on file  Transportation Needs: Not on file  Physical Activity: Not on file  Stress: Not on file  Social Connections: Not on file   Additional Social History:  Sleep: Good  Appetite:  Good  Current Medications: Current Facility-Administered Medications  Medication Dose Route Frequency Provider Last Rate Last Admin  . acetaminophen (TYLENOL) tablet 650 mg  650 mg Oral Q6H PRN [name], John T, MD      . albuterol (VENTOLIN HFA) 108 (90 Base) MCG/ACT inhaler 2 puff  2 puff Inhalation Q4H PRN [name], John T, MD      . alum & mag hydroxide-simeth  (MAALOX/MYLANTA) 200-200-20 MG/5ML suspension 30 mL  30 mL Oral Q4H PRN [name], John T, MD   30 mL at 05/26/20 1447  . benztropine (COGENTIN) tablet 0.5 mg  0.5 mg Oral BID [name], [name], MD   0.5 mg at 05/28/20 0925  . chlorproMAZINE (THORAZINE) injection 50 mg  50 mg Intramuscular TID PRN [name], John T, MD   50 mg at 05/19/20 1256   And  . diphenhydrAMINE (BENADRYL) injection 25 mg  25 mg Intramuscular TID PRN [name], [name], MD   25 mg at 05/19/20 1300  . clopidogrel (PLAVIX) tablet 75 mg  75 mg Oral Daily Salley Scarlet, MD   75 mg at 05/28/20 7846  . cloZAPine (CLOZARIL) tablet 200 mg  200 mg Oral Daily Salley Scarlet, MD   200 mg at 05/28/20 0925  . desmopressin (DDAVP) tablet 0.2 mg  0.2 mg Oral QHS Selina Cooley M, MD   0.2 mg at 05/27/20 2111  . haloperidol lactate (HALDOL) injection 5 mg  5 mg Intramuscular QID PRN Rulon Sera, MD   5 mg at 05/19/20 1440   And  . LORazepam (ATIVAN) injection 2 mg  2 mg Intramuscular Q4H PRN Rulon Sera, MD   2 mg at 05/19/20 1440   And  . diphenhydrAMINE (BENADRYL) injection 50 mg  50 mg Intramuscular Q4H PRN Rulon Sera, MD   50 mg at 05/18/20 1229  . docusate sodium (COLACE) capsule 100 mg  100 mg Oral BID PRN [name], [name], MD   100 mg at 05/17/20 0803  . ferrous sulfate tablet 325 mg  325 mg Oral Q breakfast [name], [name], MD   325 mg at 05/28/20 0924  . food thickener (THICK IT) powder   Oral PRN [name], John T, MD      . haloperidol (HALDOL) tablet 10 mg  10 mg Oral BID [name], [name], MD   10 mg at 05/28/20 9629  . haloperidol lactate (HALDOL) injection 5 mg  5 mg Intramuscular Once Rulon Sera, MD      . hydrocerin (EUCERIN) cream   Topical BID Salley Scarlet, MD   1 application at 52/84/13 1633  . LORazepam (ATIVAN) tablet 1 mg  1 mg Oral BID Salley Scarlet, MD   1 mg at 05/28/20 2440  . magnesium hydroxide (MILK OF MAGNESIA) suspension 30 mL  30 mL Oral Daily PRN [name], John T, MD      . melatonin tablet 5 mg  5 mg  Oral QHS PRN [name], [name], MD   5 mg at 05/27/20 2110  . midodrine (PROAMATINE) tablet 10 mg  10 mg Oral TID AC [name], [name], MD   10 mg at 05/28/20 0925  . neomycin-bacitracin-polymyxin (NEOSPORIN) ointment packet   Topical BID Salley Scarlet, MD   1 application at 12/18/23 1633  . nicotine (NICODERM CQ - dosed in mg/24 hours) patch 21 mg  21 mg Transdermal Daily [name], [name], MD   21 mg at 05/28/20 0929  . OLANZapine zydis (ZYPREXA) disintegrating  tablet 10 mg  10 mg Oral QID PRN Rulon Sera, MD   10 mg at 05/25/20 0902  . polyethylene glycol (MIRALAX / GLYCOLAX) packet 17 g  17 g Oral Daily PRN [name], John T, MD      . traZODone (DESYREL) tablet 100 mg  100 mg Oral QHS PRN Caroline Sauger, NP   100 mg at 05/25/20 2057  . valproic acid (DEPAKENE) 250 MG/5ML solution 750 mg  750 mg Oral QID Salley Scarlet, MD   750 mg at 05/28/20 1013  . zolpidem (AMBIEN) tablet 5 mg  5 mg Oral QHS Salley Scarlet, MD   5 mg at 05/27/20 2110    Lab Results:  Results for orders placed or performed during the hospital encounter of 05/07/20 (from the past 48 hour(s))  CBC with Differential/Platelet     Status: Abnormal   Collection Time: 05/27/20  6:42 AM  Result Value Ref Range   WBC 4.2 4.0 - 10.5 K/uL   RBC 3.74 (L) 4.22 - 5.81 MIL/uL   Hemoglobin 10.8 (L) 13.0 - 17.0 g/dL   HCT 34.5 (L) 39.0 - 52.0 %   MCV 92.2 80.0 - 100.0 fL   MCH 28.9 26.0 - 34.0 pg   MCHC 31.3 30.0 - 36.0 g/dL   RDW 15.2 11.5 - 15.5 %   Platelets 183 150 - 400 K/uL   nRBC 1.0 (H) 0.0 - 0.2 %   Neutrophils Relative % 57 %   Neutro Abs 2.4 1.7 - 7.7 K/uL   Lymphocytes Relative 23 %   Lymphs Abs 1.0 0.7 - 4.0 K/uL   Monocytes Relative 12 %   Monocytes Absolute 0.5 0.1 - 1.0 K/uL   Eosinophils Relative 3 %   Eosinophils Absolute 0.1 0.0 - 0.5 K/uL   Basophils Relative 1 %   Basophils Absolute 0.0 0.0 - 0.1 K/uL   Immature Granulocytes 4 %   Abs Immature Granulocytes 0.17 (H) 0.00 - 0.07 K/uL    Comment:  Performed at [facility], [address] [place], [place] [address]    Blood Alcohol level:  Lab Results  Component Value Date   [facility] <10 04/30/2020   ETH <10 77/82/4235    Metabolic Disorder Labs: Lab Results  Component Value Date   HGBA1C 5.4 09/30/2016   MPG 108.28 09/30/2016   MPG 114 09/17/2016   Lab Results  Component Value Date   PROLACTIN 11.8 05/01/2020   Lab Results  Component Value Date   CHOL 135 09/30/2016   TRIG 104 02/25/2018   HDL 60 09/30/2016   CHOLHDL 2.3 09/30/2016   VLDL 12 09/30/2016   LDLCALC 63 09/30/2016    Physical Findings: AIMS: Facial and Oral Movements Muscles of Facial Expression: None, normal Lips and Perioral Area: None, normal Jaw: None, normal Tongue: Moderate,Extremity Movements Upper (arms, wrists, hands, fingers): None, normal Lower (legs, knees, ankles, toes): None, normal, Trunk Movements Neck, shoulders, hips: None, normal, Overall Severity Severity of abnormal movements (highest score from questions above): None, normal Incapacitation due to abnormal movements: None, normal Patient's awareness of abnormal movements (rate only patient's report): No Awareness, Dental Status Current problems with teeth and/or dentures?: No Does patient usually wear dentures?: No  CIWA:    COWS:     Musculoskeletal: Strength & Muscle Tone: within normal limits Gait & Station: unsteady Patient leans: N/A  Psychiatric Specialty Exam:  Presentation  General Appearance: Casual  Eye Contact:Fair  Speech:Garbled  Speech Volume:Increased  Handedness:Right   Mood and  Affect  Mood:Euthymic  Affect:Constricted   Thought Process  Thought Processes:Goal Directed  Descriptions of Associations:Loose  Orientation:-- (Oriented to person and place)  Thought Content:Perseveration  History of Schizophrenia/Schizoaffective disorder: Yes Duration of Psychotic Symptoms:Greater than 6 months Hallucinations: none Ideas of  Reference:None  Suicidal Thoughts: denies Homicidal Thoughts: denies  Sensorium  Memory:Immediate Poor; Recent Poor; Remote Poor  Judgment:Impaired  Insight:Lacking   Executive Functions  Concentration:Poor  Attention Span:Poor  Recall:Poor  Fund of Knowledge:Poor  Language:Poor   Psychomotor Activity  Psychomotor Activity:Restless  Assets  Assets:Desire for Improvement; Financial Resources/Insurance; Social Support   Sleep  Sleep:Fair, 6.25 hours   Physical Exam: Physical Exam  ROS  Blood pressure (!) 112/93, pulse (!) 111, temperature 97.9 F (36.6 C), temperature source Oral, resp. rate 16, height 5\' 11"  (1.803 m), weight 75.8 kg, SpO2 97 %. Body mass index is 23.29 kg/m.   Treatment Plan Summary: Daily contact with patient to assess and evaluate symptoms and progress in treatment and Medication management  Patient is a 60 year old male with the above-stated past psychiatric history who is seen in follow-up.  Chart reviewed. Patient discussed with nursing. Patient remains at his mental and behavioral baseline.   Plan: -continue inpatient psych admission;  daily contact with patient to assess and evaluate symptoms and progress in treatment; psychoeducation.  -medications:  Schizoaffective disorder, bipolar type; moderate ID (IQ 48)- behavioral disturbance, agitation - Continue Clozaril 200 mg QHS, ANC 2562 today , weekly CBC for ANC checks, next due 4/13.  -Valproic acid level 58 on Depakote 750 mg four times daily. Patient appears to be at his baseline so will not increase despite being mildly subtherapeutic  - Continue Haldol 10 mg BID - Ativan 1 mg BID - Ambien 5 mg QHS   Mild asthma without complication- established problem, controlled - Albutrol PRN  Slow transit constipation- established problem, controlled  - Colace 100 mg BID, milk of magnesia PRN  Orthostatic hypotension- established problem, controlled - Midodrine 10 mg TID before  meals  Tobacco Use disorder - Nicoderm 21 mg patch  -continue PRN medications.  acetaminophen, albuterol, alum & mag hydroxide-simeth, chlorproMAZINE (THORAZINE) injection **AND** diphenhydrAMINE, haloperidol lactate **AND** LORazepam **AND** diphenhydrAMINE, docusate sodium, food thickener, magnesium hydroxide, melatonin, OLANZapine zydis, polyethylene glycol, traZODone  -Pertinent Labs: no new labs ordered today; [place] on 4/6, weekly CBC for ANC checks, next due 4/13.    -Consults: No new consults placed since yesterday    -Disposition: patient is awaiting placement to a facility. All necessary aftercare will be arranged prior to discharge.  -  I certify that the patient does need, on a daily basis, active treatment furnished directly by or requiring the supervision of inpatient psychiatric facility personnel.   05/28/20: Psychiatric exam above reviewed and remains accurate. Assessment and plan above reviewed and updated.    Salley Scarlet, MD 05/28/2020, 10:32 AM

## 2020-05-28 NOTE — BHH Group Notes (Signed)
LCSW Group Therapy Note     05/28/2020 1:46 PM     Type of Therapy/Topic:  Group Therapy:  Balance in Life     Participation Level:  Did Not Attend     Description of Group:    This group will address the concept of balance and how it feels and looks when one is unbalanced. Patients will be encouraged to process areas in their lives that are out of balance and identify reasons for remaining unbalanced. Facilitators will guide patients in utilizing problem-solving interventions to address and correct the stressor making their life unbalanced. Understanding and applying boundaries will be explored and addressed for obtaining and maintaining a balanced life. Patients will be encouraged to explore ways to assertively make their unbalanced needs known to significant others in their lives, using other group members and facilitator for support and feedback.     Therapeutic Goals:  1.      Patient will identify two or more emotions or situations they have that consume much of in their lives.  2.      Patient will identify signs/triggers that life has become out of balance:  3.      Patient will identify two ways to set boundaries in order to achieve balance in their lives:  4.      Patient will demonstrate ability to communicate their needs through discussion and/or role plays     Summary of Patient Progress:  X    Therapeutic Modalities:   Cognitive Behavioral Therapy  Solution-Focused Therapy  Assertiveness Training     Bera Pinela Martinique MSW, LCSW-A  05/28/2020 1:46 PM

## 2020-05-28 NOTE — BHH Counselor (Signed)
CSW contacted [facility] and [facility] group homes to follow up on FL2 that was sent regarding pt. CSW left a HIPAA compliant voicmail to [facility] and will follow up regarding placement. CSW contacted [facility] and talked with Mr. Santiago Glad, who stated that patient would not be an ideal fit for them at this time. CSW will stop pursuing [facility], CSW was not able to procure placement.   Dickson Kostelnik Martinique, MSW, LCSW-A 4/7/20223:38 PM

## 2020-05-28 NOTE — Progress Notes (Signed)
Recreation Therapy Notes   Date: 05/28/2020  Time: 9:30 am  Location: Courtyard   Behavioral response: Appropriate   Intervention Topic: Leisure  Discussion/Intervention:  Group content today was focused on leisure. The group defined what leisure is and some positive leisure activities they participate in. Individuals identified the difference between good and bad leisure. Participants expressed how they feel after participating in the leisure of their choice. The group discussed how they go about picking a leisure activity and if others are involved in their leisure activities. The patient stated how many leisure activities they too choose from and reasons why it is important to have leisure time. Individuals participated in the intervention "Leisure Exploration" where they had a chance to identify new leisure activities as well as benefits of leisure. Clinical Observations/Feedback:  Patient came to group and identified basketball and music as leisure activities he enjoys. Individual was social with peers and staff while participating in the intervention.   Barlow Harrison LRT/CTRS            Lavere Shinsky 05/28/2020 10:32 AM

## 2020-05-29 NOTE — BHH Counselor (Signed)
CSW contacted [facility] to follow up. CSW was informed that the client who was supposed to be leaving had not left and that she was uncertain when the bed would be available.   Chalmers Guest. Guerry Bruin, MSW, [place], [place] 05/29/2020 4:10 PM

## 2020-05-29 NOTE — Progress Notes (Signed)
Patient on 1:1 for safety with a sitter present. Pt didn't sleep very long last night, but has not been a problem on the unit. Pt is easily redirected. Pt is without complaint and is safe on the unit.

## 2020-05-29 NOTE — BHH Counselor (Signed)
CSW contacted [name], legal guardian, in order to update her on discharge plan. [facility] group [facility] has since rescinded acceptance and patient is no longer able to discharge to [facility]. CSW has notified leadership and will continue to pursue other group home options. Patient remains on [facility] wait list.   Signed:  Durenda Hurt, MSW, LCSWA, LCASA 05/29/2020 1:13 PM

## 2020-05-29 NOTE — Progress Notes (Signed)
Pt has been calm, cooperative and med compliant. Pt is pleasant and still on a 1:1. [name] RN

## 2020-05-29 NOTE — BHH Counselor (Signed)
CSW called Starleen Blue, representative with [facility], related to a request for interview with patient. CSW scheduled a virtual interview with him for Tuesday 06/02/20 at 1030AM. CSW sent introduction email to [email or web address].   Signed:  Durenda Hurt, MSW, [place], LCASA 05/29/2020 4:05 PM

## 2020-05-29 NOTE — Plan of Care (Signed)
Pt denies depression, anxiety, SI, HI and AVH. Pt was educated on care plan and verbalizes understanding. Pt has been calm, pleasnt and cooperative. Pt has a 1:1. [name] RN Problem: Education: Goal: Knowledge of [place] [facility] Education information/materials will improve Outcome: Progressing Goal: Emotional status will improve Outcome: Progressing Goal: Mental status will improve Outcome: Progressing Goal: Verbalization of understanding the information provided will improve Outcome: Progressing   Problem: Activity: Goal: Interest or engagement in activities will improve Outcome: Progressing Goal: Sleeping patterns will improve Outcome: Progressing   Problem: Coping: Goal: Ability to verbalize frustrations and anger appropriately will improve Outcome: Progressing Goal: Ability to demonstrate self-control will improve Outcome: Progressing   Problem: Health Behavior/Discharge Planning: Goal: Identification of resources available to assist in meeting health care needs will improve Outcome: Progressing Goal: Compliance with treatment plan for underlying cause of condition will improve Outcome: Progressing   Problem: Physical Regulation: Goal: Ability to maintain clinical measurements within normal limits will improve Outcome: Progressing   Problem: Safety: Goal: Periods of time without injury will increase Outcome: Progressing   Problem: Education: Goal: Ability to verbalize precipitating factors for violent behavior will improve Outcome: Progressing   Problem: Coping: Goal: Ability to verbalize frustrations and anger appropriately will improve Outcome: Progressing   Problem: Health Behavior/Discharge Planning: Goal: Ability to implement measures to prevent violent behavior in the future will improve Outcome: Progressing   Problem: Safety: Goal: Ability to demonstrate self-control will improve Outcome: Progressing Goal: Ability to redirect hostility and anger  into socially appropriate behaviors will improve Outcome: Progressing

## 2020-05-29 NOTE — Progress Notes (Signed)
Patient on 1:1 for safety with a sitter present. Pt cooperative and observed interacting appropriate with staff and peers. Patient compliant with medication administration per MD orders. Pt given education, support, and encouragement to be active in his treatment plan. Pt remains safe on the unit.
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
## 2020-05-29 NOTE — [facility] Group Notes (Signed)
LCSW Group Therapy Note  05/29/2020 1:52 PM  Type of Therapy and Topic:  Group Therapy:  Feelings around Relapse and Recovery  Participation Level:  Did Not Attend   Description of Group:    Patients in this group will discuss emotions they experience before and after a relapse. They will process how experiencing these feelings, or avoidance of experiencing them, relates to having a relapse. Facilitator will guide patients to explore emotions they have related to recovery. Patients will be encouraged to process which emotions are more powerful. They will be guided to discuss the emotional reaction significant others in their lives may have to their relapse or recovery. Patients will be assisted in exploring ways to respond to the emotions of others without this contributing to a relapse.  Therapeutic Goals: 1. Patient will identify two or more emotions that lead to a relapse for them 2. Patient will identify two emotions that result when they relapse 3. Patient will identify two emotions related to recovery 4. Patient will demonstrate ability to communicate their needs through discussion and/or role plays   Summary of Patient Progress: Patient did not attend group despite encouraged participation.     Therapeutic Modalities:   Cognitive Behavioral Therapy Solution-Focused Therapy Assertiveness Training Relapse Prevention Therapy   Paulla Dolly, MSW, [place], [place] 05/29/2020 1:52 PM

## 2020-05-29 NOTE — Progress Notes (Addendum)
Recreation Therapy Notes    Date: 05/29/2020   Time: 2:00pm    Location: Courtyard     Behavioral response: N/A   Intervention Topic: Strengths    Discussion/Intervention: Patient did not attend group.   Clinical Observations/Feedback:  Patient did not attend group.   Tyniesha Howald LRT/CTRS        Rohen Kimes 05/29/2020 2:26 PM

## 2020-05-30 MED ORDER — ONDANSETRON HCL 4 MG PO TABS: 4.0000 mg | ORAL_TABLET | Freq: Three times a day (TID) | ORAL | Status: DC | PRN

## 2020-05-30 NOTE — Progress Notes (Signed)
Pt has been alert and oriented to person, place, time and situation. Pt is clam, cooperative, pleasant, impulsively interrupts conversations and requires redirections, is childlike, intrusive, spends time in the dayroom watching tv, or resting in his room. Pt has poor insight regarding reason for admission, judgement is fair. Pt's affect is flat. Pt denies suicidal and homicidal ideation, denies hallucinations, denies feelings of depression and anxiety. Will continue to monitor pt per Q15 minute face checks and monitor for safety and progress.

## 2020-05-30 NOTE — Plan of Care (Signed)
  Problem: Education: Goal: Knowledge of [place] [facility] Education information/materials will improve Outcome: Progressing Goal: Emotional status will improve Outcome: Progressing Goal: Mental status will improve Outcome: Progressing Goal: Verbalization of understanding the information provided will improve Outcome: Progressing   Problem: Activity: Goal: Interest or engagement in activities will improve Outcome: Progressing Goal: Sleeping patterns will improve Outcome: Progressing   Problem: Coping: Goal: Ability to verbalize frustrations and anger appropriately will improve Outcome: Progressing Goal: Ability to demonstrate self-control will improve Outcome: Progressing   Problem: Health Behavior/Discharge Planning: Goal: Identification of resources available to assist in meeting health care needs will improve Outcome: Progressing Goal: Compliance with treatment plan for underlying cause of condition will improve Outcome: Progressing   Problem: Physical Regulation: Goal: Ability to maintain clinical measurements within normal limits will improve Outcome: Progressing   Problem: Safety: Goal: Periods of time without injury will increase Outcome: Progressing   Problem: Education: Goal: Ability to verbalize precipitating factors for violent behavior will improve Outcome: Progressing   Problem: Coping: Goal: Ability to verbalize frustrations and anger appropriately will improve Outcome: Progressing   Problem: Health Behavior/Discharge Planning: Goal: Ability to implement measures to prevent violent behavior in the future will improve Outcome: Progressing   Problem: Safety: Goal: Ability to demonstrate self-control will improve Outcome: Progressing Goal: Ability to redirect hostility and anger into socially appropriate behaviors will improve Outcome: Progressing

## 2020-05-30 NOTE — Progress Notes (Signed)
Patient has been pleasant and cooperative. No behavior issues.
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
## 2020-05-31 NOTE — Progress Notes (Signed)
[facility] MD Progress Note  05/31/2020 1:36 PM Austin Gates  MRN:  [id number]   CC "Am I going to a new group home?"  Subjective:  60 year old male with schizoaffective disorder bipolar type. Remains at baseline. No agitation or aggression noted. Denies SI/HI/AH/VH. He has a virtual interview scheduled for Tuesday with potential group home. He is hopeful to leave the hospital soon.     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 Hypertension   . Intellectual disability   . Obsessive-compulsive disorder   . Schizo-affective schizophrenia [facility])     Past Surgical History:  Procedure Laterality Date  . COLONOSCOPY WITH PROPOFOL N/A 12/28/2017   Procedure: COLONOSCOPY WITH PROPOFOL;  Surgeon: Jonathon Bellows, MD;  Location[facility] ENDOSCOPY;  Service: Gastroenterology;  Laterality: N/A;  . HYDROCELE EXCISION Bilateral 02/22/2018   Procedure: bilateral HYDROCELECTOMY ADULT;  Surgeon: Cleon Gustin, MD;  Location: [place];  Service: Urology;  Laterality: Bilateral;  . INSERTION OF ILIAC STENT Left 02/22/2018   Procedure: INSERTION LEFT SUPERIOR FEMORAL ARTERY USING 6MM X 5CM VIABHON STENT with mynx device closure on right femoral artery;  Surgeon: Waynetta Sandy, MD;  Location: [place];  Service: Vascular;  Laterality: Left;  . KNEE ARTHROSCOPY    . SCROTAL EXPLORATION N/A 02/22/2018   Procedure: SCROTUM EXPLORATION;  Surgeon: Cleon Gustin, MD;  Location: [place];  Service: Urology;  Laterality: N/A;  . UPPER EXTREMITY ANGIOGRAM Left 02/22/2018   Procedure: left lower EXTREMITY ANGIOGRAM;   Surgeon: Waynetta Sandy, MD;  Location: [facility] OR;  Service: Vascular;  Laterality: Left;   Family History:  Family History  Problem Relation Age of Onset  . Diabetes Mother    Family Psychiatric  History: See H&P Social History:  Social History   Substance and Sexual Activity  Alcohol Use No     Social History   Substance and Sexual Activity  Drug Use No    Social History   Socioeconomic History  . Marital status: Single    Spouse name: Not on file  . Number of children: Not on file  . Years of education: Not on file  . Highest education level: Not on file  Occupational History  . Occupation: Disabled  Tobacco Use  . Smoking status: Former Smoker    Types: Cigarettes  . Smokeless tobacco: Never Used  Vaping Use  . Vaping Use: Unknown  Substance and Sexual Activity  . Alcohol use: No  . Drug use: No  . Sexual activity: Never  Other Topics Concern  . Not on file  Social History Narrative   ** Merged History Encounter **       Social Determinants of Health   Financial Resource Strain: Not on file  Food Insecurity: Not on file  Transportation Needs: Not on file  Physical Activity: Not on file  Stress: Not on file  Social Connections: Not on file   Additional Social History:                         Sleep: Good  Appetite:  Good  Current Medications: Current Facility-Administered  Medications  Medication Dose Route Frequency Provider Last Rate Last Admin  . acetaminophen (TYLENOL) tablet 650 mg  650 mg Oral Q6H PRN [name], John T, MD      . albuterol (VENTOLIN HFA) 108 (90 Base) MCG/ACT inhaler 2 puff  2 puff Inhalation Q4H PRN [name], John T, MD      . alum & mag hydroxide-simeth (MAALOX/MYLANTA) 200-200-20 MG/5ML suspension 30 mL  30 mL Oral Q4H PRN [name], [name], MD   30 mL at 05/29/20 0506  . benztropine (COGENTIN) tablet 0.5 mg  0.5 mg Oral BID [name], [name], MD   0.5 mg at 05/31/20 0758  . chlorproMAZINE (THORAZINE) injection  50 mg  50 mg Intramuscular TID PRN [name], John T, MD   50 mg at 05/19/20 1256   And  . diphenhydrAMINE (BENADRYL) injection 25 mg  25 mg Intramuscular TID PRN [name], [name], MD   25 mg at 05/19/20 1300  . clopidogrel (PLAVIX) tablet 75 mg  75 mg Oral Daily Salley Scarlet, MD   75 mg at 05/31/20 0758  . cloZAPine (CLOZARIL) tablet 200 mg  200 mg Oral Daily Salley Scarlet, MD   200 mg at 05/31/20 0758  . desmopressin (DDAVP) tablet 0.2 mg  0.2 mg Oral QHS Selina Cooley M, MD   0.2 mg at 05/30/20 2132  . haloperidol lactate (HALDOL) injection 5 mg  5 mg Intramuscular QID PRN Rulon Sera, MD   5 mg at 05/19/20 1440   And  . LORazepam (ATIVAN) injection 2 mg  2 mg Intramuscular Q4H PRN Rulon Sera, MD   2 mg at 05/19/20 1440   And  . diphenhydrAMINE (BENADRYL) injection 50 mg  50 mg Intramuscular Q4H PRN Rulon Sera, MD   50 mg at 05/18/20 1229  . docusate sodium (COLACE) capsule 100 mg  100 mg Oral BID PRN [name], [name], MD   100 mg at 05/17/20 0803  . ferrous sulfate tablet 325 mg  325 mg Oral Q breakfast [name], [name], MD   325 mg at 05/31/20 0758  . food thickener (THICK IT) powder   Oral PRN [name], John T, MD      . haloperidol (HALDOL) tablet 10 mg  10 mg Oral BID [name], [name], MD   10 mg at 05/31/20 0758  . haloperidol lactate (HALDOL) injection 5 mg  5 mg Intramuscular Once Rulon Sera, MD      . hydrocerin (EUCERIN) cream   Topical BID Salley Scarlet, MD   1 application at 69/62/95 0759  . LORazepam (ATIVAN) tablet 1 mg  1 mg Oral BID Salley Scarlet, MD   1 mg at 05/31/20 0757  . magnesium hydroxide (MILK OF MAGNESIA) suspension 30 mL  30 mL Oral Daily PRN [name], John T, MD      . melatonin tablet 5 mg  5 mg Oral QHS PRN [name], [name], MD   5 mg at 05/28/20 2206  . midodrine (PROAMATINE) tablet 10 mg  10 mg Oral TID AC [name], [name], MD   10 mg at 05/31/20 1207  . neomycin-bacitracin-polymyxin (NEOSPORIN) ointment packet   Topical BID Salley Scarlet, MD   1  application at 28/41/32 1125  . nicotine (NICODERM CQ - dosed in mg/24 hours) patch 21 mg  21 mg Transdermal Daily [name], [name], MD   21 mg at 05/31/20 0800  . OLANZapine zydis (ZYPREXA) disintegrating tablet 10 mg  10 mg Oral QID PRN Rulon Sera,  MD   10 mg at 05/25/20 0902  . ondansetron (ZOFRAN) tablet 4 mg  4 mg Oral Q8H PRN Salley Scarlet, MD      . polyethylene glycol (MIRALAX / GLYCOLAX) packet 17 g  17 g Oral Daily PRN [name], John T, MD      . traZODone (DESYREL) tablet 100 mg  100 mg Oral QHS PRN Caroline Sauger, NP   100 mg at 05/25/20 2057  . valproic acid (DEPAKENE) 250 MG/5ML solution 750 mg  750 mg Oral QID Salley Scarlet, MD   750 mg at 05/31/20 1124  . zolpidem (AMBIEN) tablet 5 mg  5 mg Oral QHS Salley Scarlet, MD   5 mg at 05/30/20 2133    Lab Results: No results found for this or any previous visit (from the past 48 hour(s)).  Blood Alcohol level:  Lab Results  Component Value Date   ETH <10 04/30/2020   ETH <10 29/79/8921    Metabolic Disorder Labs: Lab Results  Component Value Date   HGBA1C 5.4 09/30/2016   MPG 108.28 09/30/2016   MPG 114 09/17/2016   Lab Results  Component Value Date   PROLACTIN 11.8 05/01/2020   Lab Results  Component Value Date   CHOL 135 09/30/2016   TRIG 104 02/25/2018   HDL 60 09/30/2016   CHOLHDL 2.3 09/30/2016   VLDL 12 09/30/2016   LDLCALC 63 09/30/2016    Physical Findings: AIMS: Facial and Oral Movements Muscles of Facial Expression: None, normal Lips and Perioral Area: None, normal Jaw: None, normal Tongue: Moderate,Extremity Movements Upper (arms, wrists, hands, fingers): None, normal Lower (legs, knees, ankles, toes): None, normal, Trunk Movements Neck, shoulders, hips: None, normal, Overall Severity Severity of abnormal movements (highest score from questions above): None, normal Incapacitation due to abnormal movements: None, normal Patient's awareness of abnormal movements (rate only patient's  report): No Awareness, Dental Status Current problems with teeth and/or dentures?: No Does patient usually wear dentures?: No  CIWA:    COWS:     Musculoskeletal: Strength & Muscle Tone: within normal limits Gait & Station: normal Patient leans: N/A  Psychiatric Specialty Exam:  Presentation  General Appearance: Casual  Eye Contact:Fair  Speech:Garbled  Speech Volume:Increased  Handedness:Right   Mood and Affect  Mood:Euthymic  Affect:Constricted   Thought Process  Thought Processes:Goal Directed  Descriptions of Associations:Loose  Orientation:-- (Oriented to person and place)  Thought Content:Perseveration  History of Schizophrenia/Schizoaffective disorder: Yes Duration of Psychotic Symptoms:Greater than 6 months Hallucinations:None Ideas of Reference:None  Suicidal Thoughts:Denies Homicidal Thoughts:Denies  Sensorium  Memory:Immediate Poor; Recent Poor; Remote Poor  Judgment:Impaired  Insight:Lacking   Executive Functions  Concentration:Poor  Attention Span:Poor  Recall:Poor  Fund of Knowledge:Poor  Language:Poor   Psychomotor Activity  Psychomotor Activity:No data recorded  Assets  Assets:Desire for Improvement; Financial Resources/Insurance; Social Support   Sleep  Sleep:No data recorded   Physical Exam: Physical Exam  ROS  Blood pressure 124/79, pulse 85, temperature 98.6 F (37 C), temperature source Oral, resp. rate 18, height 5\' 11"  (1.803 m), weight 75.8 kg, SpO2 98 %. Body mass index is 23.29 kg/m.   Treatment Plan Summary: Daily contact with patient to assess and evaluate symptoms and progress in treatment and Medication management 60 year old male with schizoaffective disorder, bipolar type and intellectual disability. Remains at psychiatric baseline, awaiting placement. Continue medications as above.   05/31/20: Psychiatric exam above reviewed and remains accurate. Assessment and plan above reviewed and updated.     Salley Scarlet,  MD 05/31/2020, 1:36 PM

## 2020-05-31 NOTE — Plan of Care (Signed)
  Problem: Education: Goal: Knowledge of [facility] Education information/materials will improve Outcome: Progressing Goal: Emotional status will improve Outcome: Progressing Goal: Mental status will improve Outcome: Progressing Goal: Verbalization of understanding the information provided will improve Outcome: Progressing   Problem: Activity: Goal: Interest or engagement in activities will improve Outcome: Progressing Goal: Sleeping patterns will improve Outcome: Progressing   Problem: Coping: Goal: Ability to verbalize frustrations and anger appropriately will improve Outcome: Progressing Goal: Ability to demonstrate self-control will improve Outcome: Progressing   Problem: Health Behavior/Discharge Planning: Goal: Identification of resources available to assist in meeting health care needs will improve Outcome: Progressing Goal: Compliance with treatment plan for underlying cause of condition will improve Outcome: Progressing   Problem: Physical Regulation: Goal: Ability to maintain clinical measurements within normal limits will improve Outcome: Progressing   Problem: Safety: Goal: Periods of time without injury will increase Outcome: Progressing   Problem: Education: Goal: Ability to verbalize precipitating factors for violent behavior will improve Outcome: Progressing   Problem: Coping: Goal: Ability to verbalize frustrations and anger appropriately will improve Outcome: Progressing   Problem: Health Behavior/Discharge Planning: Goal: Ability to implement measures to prevent violent behavior in the future will improve Outcome: Progressing   Problem: Safety: Goal: Ability to demonstrate self-control will improve Outcome: Progressing Goal: Ability to redirect hostility and anger into socially appropriate behaviors will improve Outcome: Progressing

## 2020-05-31 NOTE — Progress Notes (Signed)
Patient pleasant and cooperative. Somewhat intrusive. Focused on getting more food and the tv remote. Irritating to some of his peers, but is redirectable and readily apologizes for his behavior. No episodes of aggression

## 2020-05-31 NOTE — Progress Notes (Signed)
1:1 observation 1900-2300 Patient has been pleasant and cooperative. Fixated on food. Getting along well with patients and staff. No aggression 2300-0200 Patient is resting in bed with eyes closed 0200-0500 Resting in bed with eyes closed 0500-0700 Calm. No episodes of aggression noted

## 2020-05-31 NOTE — Progress Notes (Signed)
Pt is alert and oriented to person, place, to situation but not to time. Pt is calm, cooperative, pleasant, childlike, impulsive, less intrusive than in past days. Pt is social with peers and staff, watching tv in the dayroom playing board games with the nursing students. Pt is medication compliant. Pt showered and changed into clean scrubs and did his laundry today. Pt drew pictures for all of the staff members and security. Will continue to monitor pt per Q15 minute face checks and monitor for safety and progress. Will continue to monitor pt per Q15 minute face checks and monitor for safety and progress.

## 2020-05-31 NOTE — Plan of Care (Signed)
  Problem: Education: Goal: Knowledge of [facility] Education information/materials will improve Outcome: Progressing Goal: Emotional status will improve Outcome: Progressing Goal: Mental status will improve Outcome: Progressing Goal: Verbalization of understanding the information provided will improve Outcome: Progressing   Problem: Activity: Goal: Interest or engagement in activities will improve Outcome: Progressing Goal: Sleeping patterns will improve Outcome: Progressing   Problem: Coping: Goal: Ability to verbalize frustrations and anger appropriately will improve Outcome: Progressing Goal: Ability to demonstrate self-control will improve Outcome: Progressing   Problem: Health Behavior/Discharge Planning: Goal: Identification of resources available to assist in meeting health care needs will improve Outcome: Progressing Goal: Compliance with treatment plan for underlying cause of condition will improve Outcome: Progressing   Problem: Physical Regulation: Goal: Ability to maintain clinical measurements within normal limits will improve Outcome: Progressing   Problem: Safety: Goal: Periods of time without injury will increase Outcome: Progressing   Problem: Education: Goal: Ability to verbalize precipitating factors for violent behavior will improve Outcome: Progressing   Problem: Coping: Goal: Ability to verbalize frustrations and anger appropriately will improve Outcome: Progressing   Problem: Health Behavior/Discharge Planning: Goal: Ability to implement measures to prevent violent behavior in the future will improve Outcome: Progressing   Problem: Safety: Goal: Ability to demonstrate self-control will improve Outcome: Progressing Goal: Ability to redirect hostility and anger into socially appropriate behaviors will improve Outcome: Progressing

## 2020-06-01 NOTE — Progress Notes (Signed)
Patient presents at his baseline. Patient calm and compliant with procedures on the unit. Patient denies SI/HI/AVH. Patient stated to this writer that he is ready to go. Pt given education, support, and encouragement to be active in his treatment plan. Patient compliant with medication administration per MD orders. Pt remains safe on the unit.
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
## 2020-06-01 NOTE — Plan of Care (Signed)
Pt denies depression, anxiety, SI, HI and AVH. Pt was educated on care plan and verbalizes understanding. [name] Rn Problem: Education: Goal: Knowledge of [place] [facility] Education information/materials will improve Outcome: Progressing Goal: Emotional status will improve Outcome: Progressing Goal: Mental status will improve Outcome: Progressing Goal: Verbalization of understanding the information provided will improve Outcome: Progressing   Problem: Activity: Goal: Interest or engagement in activities will improve Outcome: Progressing Goal: Sleeping patterns will improve Outcome: Progressing   Problem: Coping: Goal: Ability to verbalize frustrations and anger appropriately will improve Outcome: Progressing Goal: Ability to demonstrate self-control will improve Outcome: Progressing   Problem: Health Behavior/Discharge Planning: Goal: Identification of resources available to assist in meeting health care needs will improve Outcome: Progressing Goal: Compliance with treatment plan for underlying cause of condition will improve Outcome: Progressing   Problem: Physical Regulation: Goal: Ability to maintain clinical measurements within normal limits will improve Outcome: Progressing   Problem: Safety: Goal: Periods of time without injury will increase Outcome: Progressing   Problem: Education: Goal: Ability to verbalize precipitating factors for violent behavior will improve Outcome: Progressing   Problem: Coping: Goal: Ability to verbalize frustrations and anger appropriately will improve Outcome: Progressing   Problem: Health Behavior/Discharge Planning: Goal: Ability to implement measures to prevent violent behavior in the future will improve Outcome: Progressing   Problem: Safety: Goal: Ability to demonstrate self-control will improve Outcome: Progressing Goal: Ability to redirect hostility and anger into socially appropriate behaviors will  improve Outcome: Progressing

## 2020-06-01 NOTE — Progress Notes (Signed)
  Pt denies depression and anxiety. Pt has been calm, cooperative and med. compliant. Pt went to group and outdoors. Pt is a pleasant and anticipating his group home interview tomorrow. [name] RN

## 2020-06-01 NOTE — [facility] Group Notes (Signed)
LCSW Group Therapy Note   06/01/2020 1:51 PM  Type of Therapy and Topic:  Group Therapy:  Overcoming Obstacles   Participation Level:  Did Not Attend   Description of Group:    In this group patients will be encouraged to explore what they see as obstacles to their own wellness and recovery. They will be guided to discuss their thoughts, feelings, and behaviors related to these obstacles. The group will process together ways to cope with barriers, with attention given to specific choices patients can make. Each patient will be challenged to identify changes they are motivated to make in order to overcome their obstacles. This group will be process-oriented, with patients participating in exploration of their own experiences as well as giving and receiving support and challenge from other group members.   Therapeutic Goals: 1. Patient will identify personal and current obstacles as they relate to admission. 2. Patient will identify barriers that currently interfere with their wellness or overcoming obstacles.  3. Patient will identify feelings, thought process and behaviors related to these barriers. 4. Patient will identify two changes they are willing to make to overcome these obstacles:      Summary of Patient Progress X    Therapeutic Modalities:   Cognitive Behavioral Therapy Solution Focused Therapy Motivational Interviewing Relapse Prevention Therapy  Hedy Camara R. Guerry Bruin, MSW, LCSW, [place] 06/01/2020 1:51 PM

## 2020-06-01 NOTE — Progress Notes (Signed)
Recreation Therapy Notes   Date: 06/01/2020  Time: 10:00 am  Location: Craft room    Behavioral response: Appropriate   Intervention Topic: Self-Care   Discussion/Intervention:  Group content today was focused on Self-Care. The group defined self-care and some positive ways they care for themselves. Individuals expressed ways and reasons why they neglected any self-care in the past. Patients described ways to improve self-care in the future. The group explained what could happen if they did not do any self-care activities at all. The group participated in the intervention "self-care assessment" where they had a chance to discover some of their weaknesses and strengths in self- care. Patient came up with a self-care plan to improve themselves in the future.  Clinical Observations/Feedback:  Patient came to group and defined self-care as taking medication and keeping hands to yourself. He explained that he reads, prays, and watches foot ball to care for himself. Individual was social with peers and staff while participating in the intervention.   Austin Gates LRT/CTRS         Austin Gates 06/01/2020 11:59 AM

## 2020-06-01 NOTE — Plan of Care (Signed)
Patient presents at his baseline   Problem: Education: Goal: Emotional status will improve Outcome: Not Progressing Goal: Mental status will improve Outcome: Not Progressing

## 2020-06-02 NOTE — Progress Notes (Signed)
[facility] MD Progress Note  06/02/2020 1:32 PM Austin Gates  MRN:  [id number]   CC "When is my interview?"  Subjective:  60 year old male with schizoaffective disorder bipolar type. No acute events overnight or today. Remains at baseline. He completed a virtual interview today for potential placement that appears to have gone well. CSW coordinating with this group home, outpatient care coordinator, and guardian for next steps.    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 Hypertension   . Intellectual disability   . Obsessive-compulsive disorder   . Schizo-affective schizophrenia [facility])     Past Surgical History:  Procedure Laterality Date  . COLONOSCOPY WITH PROPOFOL N/A 12/28/2017   Procedure: COLONOSCOPY WITH PROPOFOL;  Surgeon: Jonathon Bellows, MD;  Location: [facility] ENDOSCOPY;  Service: Gastroenterology;  Laterality: N/A;  . HYDROCELE EXCISION Bilateral 02/22/2018   Procedure: bilateral HYDROCELECTOMY ADULT;  Surgeon: Cleon Gustin, MD;  Location: [place];  Service: Urology;  Laterality: Bilateral;  . INSERTION OF ILIAC STENT Left 02/22/2018   Procedure: INSERTION LEFT SUPERIOR FEMORAL ARTERY USING 6MM X 5CM VIABHON STENT with mynx device closure on right femoral artery;  Surgeon: Waynetta Sandy, MD;  Location: [place];  Service: Vascular;  Laterality: Left;  . KNEE ARTHROSCOPY    . SCROTAL EXPLORATION N/A 02/22/2018   Procedure: SCROTUM EXPLORATION;  Surgeon: Cleon Gustin, MD;  Location: [place];  Service: Urology;  Laterality: N/A;  . UPPER EXTREMITY ANGIOGRAM Left 02/22/2018    Procedure: left lower EXTREMITY ANGIOGRAM;  Surgeon: Waynetta Sandy, MD;  Location[facility];  Service: Vascular;  Laterality: Left;   Family History:  Family History  Problem Relation Age of Onset  . Diabetes Mother    Family Psychiatric  History: See H&P Social History:  Social History   Substance and Sexual Activity  Alcohol Use No     Social History   Substance and Sexual Activity  Drug Use No    Social History   Socioeconomic History  . Marital status: Single    Spouse name: Not on file  . Number of children: Not on file  . Years of education: Not on file  . Highest education level: Not on file  Occupational History  . Occupation: Disabled  Tobacco Use  . Smoking status: Former Smoker    Types: Cigarettes  . Smokeless tobacco: Never Used  Vaping Use  . Vaping Use: Unknown  Substance and Sexual Activity  . Alcohol use: No  . Drug use: No  . Sexual activity: Never  Other Topics Concern  . Not on file  Social History Narrative   ** Merged History Encounter **       Social Determinants of Health   Financial Resource Strain: Not on file  Food Insecurity: Not on file  Transportation Needs: Not on file  Physical Activity: Not on file  Stress: Not on file  Social Connections: Not on file   Additional Social History:                         Sleep: Good  Appetite:  Good  Current  Medications: Current Facility-Administered Medications  Medication Dose Route Frequency Provider Last Rate Last Admin  . acetaminophen (TYLENOL) tablet 650 mg  650 mg Oral Q6H PRN [name], John T, MD      . albuterol (VENTOLIN HFA) 108 (90 Base) MCG/ACT inhaler 2 puff  2 puff Inhalation Q4H PRN [name], John T, MD      . alum & mag hydroxide-simeth (MAALOX/MYLANTA) 200-200-20 MG/5ML suspension 30 mL  30 mL Oral Q4H PRN [name], [name], MD   30 mL at 06/01/20 2014  . benztropine (COGENTIN) tablet 0.5 mg  0.5 mg Oral BID [name], [name], MD   0.5 mg at 06/02/20  0821  . chlorproMAZINE (THORAZINE) injection 50 mg  50 mg Intramuscular TID PRN [name], John T, MD   50 mg at 05/19/20 1256   And  . diphenhydrAMINE (BENADRYL) injection 25 mg  25 mg Intramuscular TID PRN [name], [name], MD   25 mg at 05/19/20 1300  . clopidogrel (PLAVIX) tablet 75 mg  75 mg Oral Daily Salley Scarlet, MD   75 mg at 06/02/20 0626  . cloZAPine (CLOZARIL) tablet 200 mg  200 mg Oral Daily Salley Scarlet, MD   200 mg at 06/02/20 9485  . desmopressin (DDAVP) tablet 0.2 mg  0.2 mg Oral QHS Selina Cooley M, MD   0.2 mg at 06/01/20 2104  . haloperidol lactate (HALDOL) injection 5 mg  5 mg Intramuscular QID PRN Rulon Sera, MD   5 mg at 05/19/20 1440   And  . LORazepam (ATIVAN) injection 2 mg  2 mg Intramuscular Q4H PRN Rulon Sera, MD   2 mg at 05/19/20 1440   And  . diphenhydrAMINE (BENADRYL) injection 50 mg  50 mg Intramuscular Q4H PRN Rulon Sera, MD   50 mg at 05/18/20 1229  . docusate sodium (COLACE) capsule 100 mg  100 mg Oral BID PRN [name], [name], MD   100 mg at 05/17/20 0803  . ferrous sulfate tablet 325 mg  325 mg Oral Q breakfast [name], [name], MD   325 mg at 06/02/20 4627  . food thickener (THICK IT) powder   Oral PRN [name], John T, MD      . haloperidol (HALDOL) tablet 10 mg  10 mg Oral BID [name], [name], MD   10 mg at 06/02/20 0350  . haloperidol lactate (HALDOL) injection 5 mg  5 mg Intramuscular Once Rulon Sera, MD      . hydrocerin (EUCERIN) cream   Topical BID Salley Scarlet, MD   1 application at 09/38/18 1644  . LORazepam (ATIVAN) tablet 1 mg  1 mg Oral BID Salley Scarlet, MD   1 mg at 06/02/20 2993  . magnesium hydroxide (MILK OF MAGNESIA) suspension 30 mL  30 mL Oral Daily PRN [name], John T, MD      . melatonin tablet 5 mg  5 mg Oral QHS PRN [name], [name], MD   5 mg at 05/28/20 2206  . midodrine (PROAMATINE) tablet 10 mg  10 mg Oral TID AC [name], [name], MD   10 mg at 05/31/20 1642  . neomycin-bacitracin-polymyxin (NEOSPORIN) ointment  packet   Topical BID Salley Scarlet, MD   1 application at 71/69/67 1644  . nicotine (NICODERM CQ - dosed in mg/24 hours) patch 21 mg  21 mg Transdermal Daily [name], [name], MD   21 mg at 06/02/20 0825  . OLANZapine zydis (ZYPREXA) disintegrating tablet 10 mg  10 mg Oral QID  PRN Rulon Sera, MD   10 mg at 05/25/20 0902  . ondansetron (ZOFRAN) tablet 4 mg  4 mg Oral Q8H PRN Salley Scarlet, MD      . polyethylene glycol (MIRALAX / GLYCOLAX) packet 17 g  17 g Oral Daily PRN [name], John T, MD      . traZODone (DESYREL) tablet 100 mg  100 mg Oral QHS PRN Caroline Sauger, NP   100 mg at 06/01/20 2104  . valproic acid (DEPAKENE) 250 MG/5ML solution 750 mg  750 mg Oral QID Salley Scarlet, MD   750 mg at 06/02/20 1215  . zolpidem (AMBIEN) tablet 5 mg  5 mg Oral QHS Salley Scarlet, MD   5 mg at 06/01/20 2104    Lab Results: No results found for this or any previous visit (from the past 48 hour(s)).  Blood Alcohol level:  Lab Results  Component Value Date   ETH <10 04/30/2020   ETH <10 16/11/9602    Metabolic Disorder Labs: Lab Results  Component Value Date   HGBA1C 5.4 09/30/2016   MPG 108.28 09/30/2016   MPG 114 09/17/2016   Lab Results  Component Value Date   PROLACTIN 11.8 05/01/2020   Lab Results  Component Value Date   CHOL 135 09/30/2016   TRIG 104 02/25/2018   HDL 60 09/30/2016   CHOLHDL 2.3 09/30/2016   VLDL 12 09/30/2016   LDLCALC 63 09/30/2016    Physical Findings: AIMS: Facial and Oral Movements Muscles of Facial Expression: None, normal Lips and Perioral Area: None, normal Jaw: None, normal Tongue: Moderate,Extremity Movements Upper (arms, wrists, hands, fingers): None, normal Lower (legs, knees, ankles, toes): None, normal, Trunk Movements Neck, shoulders, hips: None, normal, Overall Severity Severity of abnormal movements (highest score from questions above): None, normal Incapacitation due to abnormal movements: None, normal Patient's  awareness of abnormal movements (rate only patient's report): No Awareness, Dental Status Current problems with teeth and/or dentures?: No Does patient usually wear dentures?: No  CIWA:    COWS:     Musculoskeletal: Strength & Muscle Tone: within normal limits Gait & Station: normal Patient leans: N/A  Psychiatric Specialty Exam:  Presentation  General Appearance: Casual  Eye Contact:Fair  Speech:Garbled  Speech Volume:Increased  Handedness:Right   Mood and Affect  Mood:Euthymic  Affect:Constricted   Thought Process  Thought Processes:Goal Directed  Descriptions of Associations:Loose  Orientation:-- (Oriented to person and place)  Thought Content:Perseveration  History of Schizophrenia/Schizoaffective disorder: Yes Duration of Psychotic Symptoms:Greater than 6 months Hallucinations:None Ideas of Reference:None  Suicidal Thoughts:Denies Homicidal Thoughts:Denies  Sensorium  Memory:Immediate Poor; Recent Poor; Remote Poor  Judgment:Impaired  Insight:Lacking   Executive Functions  Concentration:Poor  Attention Span:Poor  Recall:Poor  Fund of Knowledge:Poor  Language:Poor   Psychomotor Activity  Psychomotor Activity:Normal  Assets  Assets:Desire for Improvement; Financial Resources/Insurance; Social Support   Sleep  Sleep:Fair, 6 hours   Physical Exam: Physical Exam  ROS  Blood pressure 123/81, pulse (!) 133, temperature 98 F (36.7 C), temperature source Oral, resp. rate 18, height 5\' 11"  (1.803 m), weight 75.8 kg, SpO2 97 %. Body mass index is 23.29 kg/m.   Treatment Plan Summary: Daily contact with patient to assess and evaluate symptoms and progress in treatment and Medication management 60 year old male with schizoaffective disorder, bipolar type and intellectual disability. Remains at psychiatric baseline, awaiting placement. Continue medications as above.   06/02/20: Psychiatric exam above reviewed and remains accurate.  Assessment and plan above reviewed and updated.  Salley Scarlet, MD 06/02/2020, 1:32 PM

## 2020-06-02 NOTE — Progress Notes (Signed)
Pt has no complaints. Pt has attended groups, Pt has been calm, cooperative and med compliant. Pt seemed to feel content about his interview today for the group home. Pt had a scheduled med held today because of contraindication.  [name] RN

## 2020-06-02 NOTE — Tx Team (Signed)
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 acetaminophen (TYLENOL) tablet 650 mg  650 mg Oral Q6H PRN [name], John T, MD      . albuterol (VENTOLIN HFA) 108 (90 Base) MCG/ACT inhaler 2 puff  2 puff Inhalation Q4H PRN [name], John T, MD      . alum & mag hydroxide-simeth (MAALOX/MYLANTA) 200-200-20 MG/5ML suspension 30 mL  30 mL Oral Q4H PRN [name], [name], MD   30 mL at 06/01/20 2014  . benztropine (COGENTIN) tablet 0.5 mg  0.5 mg Oral BID [name], [name], MD   0.5 mg at 06/02/20 0821  . chlorproMAZINE (THORAZINE) injection 50 mg  50 mg Intramuscular TID PRN [name], John T, MD   50 mg at 05/19/20 1256   And  . diphenhydrAMINE (BENADRYL) injection 25 mg  25 mg Intramuscular TID PRN [name], [name], MD   25 mg at 05/19/20 1300  . clopidogrel (PLAVIX) tablet 75 mg  75 mg Oral Daily Salley Scarlet, MD   75 mg at 06/02/20 3235  . cloZAPine (CLOZARIL) tablet 200 mg  200 mg Oral Daily Salley Scarlet, MD   200 mg at 06/02/20 5732  . desmopressin (DDAVP) tablet 0.2 mg  0.2 mg Oral QHS Selina Cooley M, MD   0.2 mg at 06/01/20 2104  . haloperidol lactate (HALDOL) injection 5 mg  5 mg Intramuscular QID PRN Rulon Sera, MD   5 mg at 05/19/20 1440   And  . LORazepam (ATIVAN) injection 2 mg  2 mg Intramuscular Q4H PRN Rulon Sera, MD   2 mg at 05/19/20 1440   And  . diphenhydrAMINE (BENADRYL) injection 50 mg  50 mg Intramuscular Q4H PRN Rulon Sera, MD   50 mg at  05/18/20 1229  . docusate sodium (COLACE) capsule 100 mg  100 mg Oral BID PRN [name], [name], MD   100 mg at 05/17/20 0803  . ferrous sulfate tablet 325 mg  325 mg Oral Q breakfast [name], [name], MD   325 mg at 06/02/20 2025  . food thickener (THICK IT) powder   Oral PRN [name], John T, MD      . haloperidol (HALDOL) tablet 10 mg  10 mg Oral BID [name], [name], MD   10 mg at 06/02/20 4270  . haloperidol lactate (HALDOL) injection 5 mg  5 mg Intramuscular Once Rulon Sera, MD      . hydrocerin (EUCERIN) cream   Topical BID Salley Scarlet, MD   1 application at 62/37/62 1644  . LORazepam (ATIVAN) tablet 1 mg  1 mg Oral BID Salley Scarlet, MD   1 mg at 06/02/20 8315  . magnesium hydroxide (MILK OF MAGNESIA) suspension 30 mL  30 mL Oral Daily PRN [name], John T, MD      . melatonin tablet 5 mg  5 mg Oral QHS PRN [name], [name], MD   5 mg at 05/28/20 2206  . midodrine (PROAMATINE) tablet 10 mg  10 mg Oral TID AC [name], [name], MD  10 mg at 05/31/20 1642  . neomycin-bacitracin-polymyxin (NEOSPORIN) ointment packet   Topical BID Salley Scarlet, MD   1 application at 47/42/59 1644  . nicotine (NICODERM CQ - dosed in mg/24 hours) patch 21 mg  21 mg Transdermal Daily [name], [name], MD   21 mg at 06/02/20 0825  . OLANZapine zydis (ZYPREXA) disintegrating tablet 10 mg  10 mg Oral QID PRN Rulon Sera, MD   10 mg at 05/25/20 0902  . ondansetron (ZOFRAN) tablet 4 mg  4 mg Oral Q8H PRN Salley Scarlet, MD      . polyethylene glycol (MIRALAX / GLYCOLAX) packet 17 g  17 g Oral Daily PRN [name], John T, MD      . traZODone (DESYREL) tablet 100 mg  100 mg Oral QHS PRN Caroline Sauger, NP   100 mg at 06/01/20 2104  . valproic acid (DEPAKENE) 250 MG/5ML solution 750 mg  750 mg Oral QID Salley Scarlet, MD   750 mg at 06/02/20 0824  . zolpidem (AMBIEN) tablet 5 mg  5 mg Oral QHS Salley Scarlet, MD   5 mg at 06/01/20 2104   PTA Medications: Medications Prior to Admission  Medication Sig  Dispense Refill Last Dose  . benztropine (COGENTIN) 0.5 MG tablet Take 0.5 mg by mouth 2 (two) times daily.   05/07/2020 at Unknown time  . divalproex (DEPAKOTE) 500 MG DR tablet Take 500 mg by mouth 3 (three) times daily.     . Ensure (ENSURE) Take 237 mLs by mouth in the morning and at bedtime.     . ferrous sulfate 325 (65 FE) MG tablet Take 325 mg by mouth daily with breakfast.     . food thickener (THICK IT) POWD Take by mouth in the morning, at noon, and at bedtime.     Marland Kitchen lactulose (CHRONULAC) 10 GM/15ML solution Take 20 g by mouth daily.     . melatonin 3 MG TABS tablet Take 3 mg by mouth at bedtime as needed (sleep).     . midodrine (PROAMATINE) 10 MG tablet Take 10 mg by mouth 3 (three) times daily.       Patient Stressors: Health problems Marital or family conflict  Patient Strengths: Supportive family/friends  Treatment Modalities: Medication Management, Group therapy, Case management,  1 to 1 session with clinician, Psychoeducation, Recreational therapy.   Physician Treatment Plan for Primary Diagnosis: Schizoaffective disorder, bipolar type ([place]) Long Term Goal(s): Improvement in symptoms so as ready for discharge Improvement in symptoms so as ready for discharge   Short Term Goals: Ability to identify changes in lifestyle to reduce recurrence of condition will improve Ability to verbalize feelings will improve Ability to disclose and discuss suicidal ideas Ability to demonstrate self-control will improve Ability to identify and develop effective coping behaviors will improve Ability to maintain clinical measurements within normal limits will improve Compliance with prescribed medications will improve Ability to identify changes in lifestyle to reduce recurrence of condition will improve Ability to verbalize feelings will improve Ability to disclose and discuss suicidal ideas Ability to demonstrate self-control will improve Ability to identify and develop effective  coping behaviors will improve Ability to maintain clinical measurements within normal limits will improve Compliance with prescribed medications will improve Ability to identify triggers associated with substance abuse/mental health issues will improve  Medication Management: Evaluate patient's response, side effects, and tolerance of medication regimen.  Therapeutic Interventions: 1 to 1 sessions, Unit Group sessions and Medication administration.  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   Hypotension   Mild asthma without complication   Slow transit constipation  Long Term Goal(s): Improvement in symptoms so as ready for discharge Improvement in symptoms so as ready for discharge   Short Term Goals: Ability to identify changes in lifestyle to reduce recurrence of condition will improve Ability to verbalize feelings will improve Ability to disclose and discuss suicidal ideas Ability to demonstrate self-control will improve Ability to identify and develop effective coping behaviors will improve Ability to maintain clinical measurements within normal limits will improve Compliance with prescribed medications will improve Ability to identify changes in lifestyle to reduce recurrence of condition will improve Ability to verbalize feelings will improve Ability to disclose and discuss suicidal ideas Ability to demonstrate self-control will improve Ability to identify and develop effective coping behaviors will improve Ability to maintain clinical measurements within normal limits will improve Compliance with prescribed medications will improve Ability to identify triggers associated with substance abuse/mental health issues will improve     Medication Management: Evaluate patient's response,  side effects, and tolerance of medication regimen.  Therapeutic Interventions: 1 to 1 sessions, Unit Group sessions and Medication administration.  Evaluation of Outcomes: Progressing   RN Treatment Plan for Primary Diagnosis: Schizoaffective disorder, bipolar type ([place]) Long Term Goal(s): Knowledge of disease and therapeutic regimen to maintain health will improve  Short Term Goals: Ability to remain free from injury will improve, Ability to verbalize frustration and anger appropriately will improve, Ability to demonstrate self-control, Ability to participate in decision making will improve, Ability to verbalize feelings will improve, Ability to identify and develop effective coping behaviors will improve and Compliance with prescribed medications will improve  Medication Management: RN will administer medications as ordered by provider, will assess and evaluate patient's response and provide education to patient for prescribed medication. RN will report any adverse and/or side effects to prescribing provider.  Therapeutic Interventions: 1 on 1 counseling sessions, Psychoeducation, Medication administration, Evaluate responses to treatment, Monitor vital signs and CBGs as ordered, Perform/monitor CIWA, COWS, AIMS and Fall Risk screenings as ordered, Perform wound care treatments as ordered.  Evaluation of Outcomes: Progressing   LCSW Treatment Plan for Primary Diagnosis: Schizoaffective disorder, bipolar type ([place]) Long Term Goal(s): Safe transition to appropriate next level of care at discharge, Engage patient in therapeutic group addressing interpersonal concerns.  Short Term Goals: Engage patient in aftercare planning with referrals and resources, Increase social support, Increase ability to appropriately verbalize feelings, Increase emotional regulation, Facilitate acceptance of mental health diagnosis and concerns, Identify triggers associated with mental health/substance abuse issues and  Increase skills for wellness and recovery  Therapeutic Interventions: Assess for all discharge needs, 1 to 1 time with Social worker, Explore available resources and support systems, Assess for adequacy in community support network, Educate family and significant other(s) on suicide prevention, Complete Psychosocial Assessment, Interpersonal group therapy.  Evaluation of Outcomes: Progressing   Progress in Treatment: Attending groups: Yes. Participating in groups: Yes. Taking medication as prescribed: Yes. Toleration medication: Yes. Family/Significant other contact made: Yes, individual(s) contacted:  [name], Guardian/Sister Patient understands diagnosis: No. Discussing patient identified problems/goals with staff: Yes. Medical problems stabilized or resolved: Yes. Denies suicidal/homicidal ideation: Yes. Issues/concerns per patient self-inventory: No. Other: None  New problem(s) identified: No, Describe:  None  New Short Term/Long Term Goal(s): Elimination of  symptoms of psychosis, medication management for mood stabilization; development of comprehensive mental wellness plan. Update 05/13/20: No changes at this time. Update 05/18/20: No changes at this time. Update 05/23/20: No changes at this time. Update 05/28/20: No changes at this time. Update 06/02/20 No changes at this time.   Patient Goals: "I want to go home." Update 05/13/20: No changes at this time. Update 05/18/20: No changes at this time. Update 05/23/20: No changes at this time. Update 05/28/20: No changes at this time.  Update 06/02/20 no change at this time.    Discharge Plan or Barriers: CSW will assist with development of an appropriate discharge/aftercare plan. Patient will need placement and has a guardian. Update 05/13/20: Patients behavior has become increasingly aggressive. Referral has been made to [facility] and patient is on waiting list for [facility]. Update 05/18/20: Patient remains aggressive and isolated  to back hall. Patient is on the wait list for [place]. Update 05/23/20: Patient is no longer isolated to the back hallway. Update 05/28/20: Pt has potential group home placement.   Update 06/02/20: prior placement fell through, patient has an interview for potential placement on this day at 1030 AM.   Reason for Continuation of Hospitalization: Aggression Medical Issues Medication stabilization  Estimated Length of Stay: TBD  Attendees: Patient: 06/02/2020 8:53 AM  Physician: Selina Cooley, MD 06/02/2020 8:53 AM  Nursing:  06/02/2020 8:53 AM  RN Care Manager: 06/02/2020 8:53 AM  Social Worker: Paulla Dolly, MSW, [place], [place] 06/02/2020 8:53 AM  Recreational Therapist:  06/02/2020 8:53 AM  Other: Kiva [place], MSW, LCSW-A 06/02/2020 8:53 AM  Other: Chalmers Guest. Guerry Bruin, MSW, Rosa Sanchez, [place] 06/02/2020 8:53 AM  Other: 06/02/2020 8:53 AM    Scribe for Treatment Team: Durenda Hurt, Latanya Presser 06/02/2020 8:53 AM

## 2020-06-02 NOTE — [facility] Counselor (Signed)
CSW met with patient and representative with potential group home placement. Patient engaged in interview, no behavioral or verbal disturbances during the interview.  After the interview, Austin Gates reports that he will send an application to be filled out by the guardian and Scientist, research (medical). CSW will continue to follow.   Signed:  Durenda Hurt, MSW, [place], LCASA 06/02/2020 1:44 PM

## 2020-06-02 NOTE — [facility] Group Notes (Signed)
LCSW Group Therapy Note     06/02/2020 2:34 PM     Type of Therapy/Topic:  Group Therapy:  Feelings about Diagnosis     Participation Level:  Did Not Attend     Description of Group:   This group will allow patients to explore their thoughts and feelings about diagnoses they have received. Patients will be guided to explore their level of understanding and acceptance of these diagnoses. Facilitator will encourage patients to process their thoughts and feelings about the reactions of others to their diagnosis and will guide patients in identifying ways to discuss their diagnosis with significant others in their lives. This group will be process-oriented, with patients participating in exploration of their own experiences, giving and receiving support, and processing challenge from other group members.        Therapeutic Goals:  1.    Patient will demonstrate understanding of diagnosis as evidenced by identifying two or more symptoms of the disorder  2.    Patient will be able to express two feelings regarding the diagnosis  3.    Patient will demonstrate their ability to communicate their needs through discussion and/or role play     Summary of Patient Progress: X   Therapeutic Modalities:   Cognitive Behavioral Therapy  Brief Therapy  Feelings Identification    Austin Gates, MSW, LCSW-A  06/02/2020 2:34 PM

## 2020-06-02 NOTE — Plan of Care (Signed)
Pt denies depression, anxiety, SI, HI and AVH. Pt was educated on care plan and verbalizes understanding. Pt anticipates interview, [name] RN Problem: Education: Goal: Knowledge of [facility] Education information/materials will improve Outcome: Progressing Goal: Emotional status will improve Outcome: Progressing Goal: Mental status will improve Outcome: Progressing Goal: Verbalization of understanding the information provided will improve Outcome: Progressing   Problem: Activity: Goal: Interest or engagement in activities will improve Outcome: Progressing Goal: Sleeping patterns will improve Outcome: Progressing   Problem: Coping: Goal: Ability to verbalize frustrations and anger appropriately will improve Outcome: Progressing Goal: Ability to demonstrate self-control will improve Outcome: Progressing   Problem: Health Behavior/Discharge Planning: Goal: Identification of resources available to assist in meeting health care needs will improve Outcome: Progressing Goal: Compliance with treatment plan for underlying cause of condition will improve Outcome: Progressing   Problem: Physical Regulation: Goal: Ability to maintain clinical measurements within normal limits will improve Outcome: Progressing   Problem: Safety: Goal: Periods of time without injury will increase Outcome: Progressing   Problem: Education: Goal: Ability to verbalize precipitating factors for violent behavior will improve Outcome: Progressing   Problem: Coping: Goal: Ability to verbalize frustrations and anger appropriately will improve Outcome: Progressing   Problem: Health Behavior/Discharge Planning: Goal: Ability to implement measures to prevent violent behavior in the future will improve Outcome: Progressing   Problem: Safety: Goal: Ability to demonstrate self-control will improve Outcome: Progressing Goal: Ability to redirect hostility and anger into socially appropriate  behaviors will improve Outcome: Progressing

## 2020-06-02 NOTE — Plan of Care (Signed)
Patient presents at his baseline   Problem: Education: Goal: Emotional status will improve Outcome: Not Progressing Goal: Mental status will improve Outcome: Not Progressing

## 2020-06-02 NOTE — Progress Notes (Signed)
Recreation Therapy Notes    Date: 06/02/2020   Time: 9:30 am   Location: Craft room    Behavioral response: N/A   Intervention Topic: Wellness    Discussion/Intervention: Patient did not attend group.   Clinical Observations/Feedback:  Patient did not attend group.   Jiles Goya LRT/CTRS          Miguelangel Korn 06/02/2020 11:18 AM

## 2020-06-03 LAB — CBC WITH DIFFERENTIAL/PLATELET
Abs Immature Granulocytes: 0.08 10*3/uL — ABNORMAL HIGH (ref 0.00–0.07)
Basophils Absolute: 0 10*3/uL (ref 0.0–0.1)
Basophils Relative: 1 %
Eosinophils Absolute: 0.1 10*3/uL (ref 0.0–0.5)
Eosinophils Relative: 2 %
HCT: 37.9 % — ABNORMAL LOW (ref 39.0–52.0)
Hemoglobin: 12 g/dL — ABNORMAL LOW (ref 13.0–17.0)
Immature Granulocytes: 2 %
Lymphocytes Relative: 23 %
Lymphs Abs: 1 10*3/uL (ref 0.7–4.0)
MCH: 29.2 pg (ref 26.0–34.0)
MCHC: 31.7 g/dL (ref 30.0–36.0)
MCV: 92.2 fL (ref 80.0–100.0)
Monocytes Absolute: 0.5 10*3/uL (ref 0.1–1.0)
Monocytes Relative: 11 %
Neutro Abs: 2.7 10*3/uL (ref 1.7–7.7)
Neutrophils Relative %: 61 %
Platelets: 163 10*3/uL (ref 150–400)
RBC: 4.11 MIL/uL — ABNORMAL LOW (ref 4.22–5.81)
RDW: 15.9 % — ABNORMAL HIGH (ref 11.5–15.5)
WBC: 4.3 10*3/uL (ref 4.0–10.5)
nRBC: 0.5 % — ABNORMAL HIGH (ref 0.0–0.2)

## 2020-06-03 NOTE — Progress Notes (Signed)
Recreation Therapy Notes  Date: 06/03/2020  Time: 9:30 am  Location: Courtyard   Behavioral response: Appropriate   Intervention Topic: Social-Skills   Discussion/Intervention:  Group content on today was focused on social skills. The group defined social skills and identified ways they use social skills. Patients expressed what obstacles they face when trying to be social. Participants described the importance of social skills. The group listed ways to improve social skills and reasons to improve social skills. Individuals had an opportunity to learn new and improve social skills as well as identify their weaknesses. Clinical Observations/Feedback:  Patient came to group and defined social skills as talking and giving compliments. He stated that his social skills involve saying "yes sir/no sir" to others. Participant explained that he can improve his social skills by getting away from bad things and being nice. Individual was social with peers and staff while participating in the intervention.   Elani Delph LRT/CTRS         Pearlee Arvizu 06/03/2020 10:43 AM

## 2020-06-03 NOTE — Progress Notes (Signed)
Pt has been calm, cooperative and med compliant. Pt enjoyed group and outdoor time. Pt speaks a lot about wanting to discharge but in a positive, hopeful way. [name] RN

## 2020-06-03 NOTE — [facility] Group Notes (Signed)
LCSW Group Therapy Note  06/03/2020 2:20 PM  Type of Therapy/Topic:  Group Therapy:  Emotion Regulation  Participation Level:  Did Not Attend   Description of Group:   The purpose of this group is to assist patients in learning to regulate negative emotions and experience positive emotions. Patients will be guided to discuss ways in which they have been vulnerable to their negative emotions. These vulnerabilities will be juxtaposed with experiences of positive emotions or situations, and patients will be challenged to use positive emotions to combat negative ones. Special emphasis will be placed on coping with negative emotions in conflict situations, and patients will process healthy conflict resolution skills.  Therapeutic Goals: 1. Patient will identify two positive emotions or experiences to reflect on in order to balance out negative emotions 2. Patient will label two or more emotions that they find the most difficult to experience 3. Patient will demonstrate positive conflict resolution skills through discussion and/or role plays  Summary of Patient Progress: Patient did not attend group despite encouraged participation.    Therapeutic Modalities:   Cognitive Behavioral Therapy Feelings Identification Dialectical Behavioral Therapy  Paulla Dolly, MSW, [place], [place] 06/03/2020 2:20 PM

## 2020-06-03 NOTE — Plan of Care (Signed)
Pt denies depression, anxiety, SI, HI and AVH. Pt was educated on care plan and verbalizes understanding. Pt is calm, cooperative and med compliant. Pt has high hopes for discharge soon. [name] RN Problem: Education: Goal: Knowledge of [facility] Education information/materials will improve Outcome: Progressing Goal: Emotional status will improve Outcome: Progressing Goal: Mental status will improve Outcome: Progressing Goal: Verbalization of understanding the information provided will improve Outcome: Progressing   Problem: Activity: Goal: Interest or engagement in activities will improve Outcome: Progressing Goal: Sleeping patterns will improve Outcome: Progressing   Problem: Coping: Goal: Ability to verbalize frustrations and anger appropriately will improve Outcome: Progressing Goal: Ability to demonstrate self-control will improve Outcome: Progressing   Problem: Health Behavior/Discharge Planning: Goal: Identification of resources available to assist in meeting health care needs will improve Outcome: Progressing Goal: Compliance with treatment plan for underlying cause of condition will improve Outcome: Progressing   Problem: Physical Regulation: Goal: Ability to maintain clinical measurements within normal limits will improve Outcome: Progressing   Problem: Safety: Goal: Periods of time without injury will increase Outcome: Progressing   Problem: Education: Goal: Ability to verbalize precipitating factors for violent behavior will improve Outcome: Progressing   Problem: Coping: Goal: Ability to verbalize frustrations and anger appropriately will improve Outcome: Progressing   Problem: Health Behavior/Discharge Planning: Goal: Ability to implement measures to prevent violent behavior in the future will improve Outcome: Progressing   Problem: Safety: Goal: Ability to demonstrate self-control will improve Outcome: Progressing Goal: Ability to  redirect hostility and anger into socially appropriate behaviors will improve Outcome: Progressing

## 2020-06-03 NOTE — Progress Notes (Signed)
[facility] MD Progress Note  06/03/2020 12:49 PM Austin Gates  MRN:  [id number]   CC "I'm leaving tomorrow?"  Subjective:  60 year old male with schizoaffective disorder bipolar type. No acute events overnight or today. Remains at baseline. Denies SI/HI/AH/VH. No changes to medications. 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 Hypertension   . Intellectual disability   . Obsessive-compulsive disorder   . Schizo-affective schizophrenia [facility])     Past Surgical History:  Procedure Laterality Date  . COLONOSCOPY WITH PROPOFOL N/A 12/28/2017   Procedure: COLONOSCOPY WITH PROPOFOL;  Surgeon: Jonathon Bellows, MD;  Location: [facility] ENDOSCOPY;  Service: Gastroenterology;  Laterality: N/A;  . HYDROCELE EXCISION Bilateral 02/22/2018   Procedure: bilateral HYDROCELECTOMY ADULT;  Surgeon: Cleon Gustin, MD;  Location: [place];  Service: Urology;  Laterality: Bilateral;  . INSERTION OF ILIAC STENT Left 02/22/2018   Procedure: INSERTION LEFT SUPERIOR FEMORAL ARTERY USING 6MM X 5CM VIABHON STENT with mynx device closure on right femoral artery;  Surgeon: Waynetta Sandy, MD;  Location: [place];  Service: Vascular;  Laterality: Left;  . KNEE ARTHROSCOPY    . SCROTAL EXPLORATION N/A 02/22/2018   Procedure: SCROTUM EXPLORATION;  Surgeon: Cleon Gustin, MD;  Location: [place];  Service: Urology;  Laterality: N/A;  . UPPER EXTREMITY ANGIOGRAM Left 02/22/2018   Procedure: left lower EXTREMITY ANGIOGRAM;  Surgeon: Waynetta Sandy, MD;  Location: [facility] OR;   Service: Vascular;  Laterality: Left;   Family History:  Family History  Problem Relation Age of Onset  . Diabetes Mother    Family Psychiatric  History: See H&P Social History:  Social History   Substance and Sexual Activity  Alcohol Use No     Social History   Substance and Sexual Activity  Drug Use No    Social History   Socioeconomic History  . Marital status: Single    Spouse name: Not on file  . Number of children: Not on file  . Years of education: Not on file  . Highest education level: Not on file  Occupational History  . Occupation: Disabled  Tobacco Use  . Smoking status: Former Smoker    Types: Cigarettes  . Smokeless tobacco: Never Used  Vaping Use  . Vaping Use: Unknown  Substance and Sexual Activity  . Alcohol use: No  . Drug use: No  . Sexual activity: Never  Other Topics Concern  . Not on file  Social History Narrative   ** Merged History Encounter **       Social Determinants of Health   Financial Resource Strain: Not on file  Food Insecurity: Not on file  Transportation Needs: Not on file  Physical Activity: Not on file  Stress: Not on file  Social Connections: Not on file   Additional Social History:                         Sleep: Good  Appetite:  Good  Current Medications: Current Facility-Administered Medications  Medication Dose Route Frequency Provider Last Rate Last Admin  . acetaminophen (  TYLENOL) tablet 650 mg  650 mg Oral Q6H PRN [name], John T, MD      . albuterol (VENTOLIN HFA) 108 (90 Base) MCG/ACT inhaler 2 puff  2 puff Inhalation Q4H PRN [name], John T, MD      . alum & mag hydroxide-simeth (MAALOX/MYLANTA) 200-200-20 MG/5ML suspension 30 mL  30 mL Oral Q4H PRN [name], [name], MD   30 mL at 06/01/20 2014  . benztropine (COGENTIN) tablet 0.5 mg  0.5 mg Oral BID [name], [name], MD   0.5 mg at 06/03/20 0856  . chlorproMAZINE (THORAZINE) injection 50 mg  50 mg Intramuscular TID PRN [name], John T, MD   50  mg at 05/19/20 1256   And  . diphenhydrAMINE (BENADRYL) injection 25 mg  25 mg Intramuscular TID PRN [name], [name], MD   25 mg at 05/19/20 1300  . clopidogrel (PLAVIX) tablet 75 mg  75 mg Oral Daily Salley Scarlet, MD   75 mg at 06/03/20 0856  . cloZAPine (CLOZARIL) tablet 200 mg  200 mg Oral Daily Salley Scarlet, MD   200 mg at 06/03/20 0857  . desmopressin (DDAVP) tablet 0.2 mg  0.2 mg Oral QHS Selina Cooley M, MD   0.2 mg at 06/02/20 2102  . haloperidol lactate (HALDOL) injection 5 mg  5 mg Intramuscular QID PRN Rulon Sera, MD   5 mg at 05/19/20 1440   And  . LORazepam (ATIVAN) injection 2 mg  2 mg Intramuscular Q4H PRN Rulon Sera, MD   2 mg at 05/19/20 1440   And  . diphenhydrAMINE (BENADRYL) injection 50 mg  50 mg Intramuscular Q4H PRN Rulon Sera, MD   50 mg at 05/18/20 1229  . docusate sodium (COLACE) capsule 100 mg  100 mg Oral BID PRN [name], [name], MD   100 mg at 05/17/20 0803  . ferrous sulfate tablet 325 mg  325 mg Oral Q breakfast [name], [name], MD   325 mg at 06/03/20 0857  . food thickener (THICK IT) powder   Oral PRN [name], John T, MD      . haloperidol (HALDOL) tablet 10 mg  10 mg Oral BID [name], [name], MD   10 mg at 06/03/20 0857  . haloperidol lactate (HALDOL) injection 5 mg  5 mg Intramuscular Once Rulon Sera, MD      . hydrocerin (EUCERIN) cream   Topical BID Salley Scarlet, MD   Given at 06/03/20 [phone]  . LORazepam (ATIVAN) tablet 1 mg  1 mg Oral BID Salley Scarlet, MD   1 mg at 06/03/20 0857  . magnesium hydroxide (MILK OF MAGNESIA) suspension 30 mL  30 mL Oral Daily PRN [name], John T, MD      . melatonin tablet 5 mg  5 mg Oral QHS PRN [name], [name], MD   5 mg at 06/02/20 2103  . midodrine (PROAMATINE) tablet 10 mg  10 mg Oral TID AC [name], [name], MD   10 mg at 06/03/20 0908  . neomycin-bacitracin-polymyxin (NEOSPORIN) ointment packet   Topical BID Salley Scarlet, MD   1 application at 37/90/24 1644  . nicotine (NICODERM CQ - dosed in  mg/24 hours) patch 21 mg  21 mg Transdermal Daily [name], [name], MD   21 mg at 06/03/20 0857  . OLANZapine zydis (ZYPREXA) disintegrating tablet 10 mg  10 mg Oral QID PRN Rulon Sera, MD   10 mg at 05/25/20 0902  . ondansetron (ZOFRAN) tablet 4 mg  4 mg Oral Q8H PRN Salley Scarlet, MD      . polyethylene glycol (MIRALAX / GLYCOLAX) packet 17 g  17 g Oral Daily PRN [name], John T, MD      . traZODone (DESYREL) tablet 100 mg  100 mg Oral QHS PRN Caroline Sauger, NP   100 mg at 06/01/20 2104  . valproic acid (DEPAKENE) 250 MG/5ML solution 750 mg  750 mg Oral QID Salley Scarlet, MD   750 mg at 06/03/20 1156  . zolpidem (AMBIEN) tablet 5 mg  5 mg Oral QHS Salley Scarlet, MD   5 mg at 06/02/20 2103    Lab Results:  Results for orders placed or performed during the hospital encounter of 05/07/20 (from the past 48 hour(s))  CBC with Differential/Platelet     Status: Abnormal   Collection Time: 06/03/20  7:23 AM  Result Value Ref Range   WBC 4.3 4.0 - 10.5 K/uL   RBC 4.11 (L) 4.22 - 5.81 MIL/uL   Hemoglobin 12.0 (L) 13.0 - 17.0 g/dL   HCT 37.9 (L) 39.0 - 52.0 %   MCV 92.2 80.0 - 100.0 fL   MCH 29.2 26.0 - 34.0 pg   MCHC 31.7 30.0 - 36.0 g/dL   RDW 15.9 (H) 11.5 - 15.5 %   Platelets 163 150 - 400 K/uL   nRBC 0.5 (H) 0.0 - 0.2 %   Neutrophils Relative % 61 %   Neutro Abs 2.7 1.7 - 7.7 K/uL   Lymphocytes Relative 23 %   Lymphs Abs 1.0 0.7 - 4.0 K/uL   Monocytes Relative 11 %   Monocytes Absolute 0.5 0.1 - 1.0 K/uL   Eosinophils Relative 2 %   Eosinophils Absolute 0.1 0.0 - 0.5 K/uL   Basophils Relative 1 %   Basophils Absolute 0.0 0.0 - 0.1 K/uL   Immature Granulocytes 2 %   Abs Immature Granulocytes 0.08 (H) 0.00 - 0.07 K/uL    Comment: Performed at [facility], [address] [place], [place] [address]    Blood Alcohol level:  Lab Results  Component Value Date   [facility] <10 04/30/2020   ETH <10 37/62/8315    Metabolic Disorder Labs: Lab Results  Component  Value Date   HGBA1C 5.4 09/30/2016   MPG 108.28 09/30/2016   MPG 114 09/17/2016   Lab Results  Component Value Date   PROLACTIN 11.8 05/01/2020   Lab Results  Component Value Date   CHOL 135 09/30/2016   TRIG 104 02/25/2018   HDL 60 09/30/2016   CHOLHDL 2.3 09/30/2016   VLDL 12 09/30/2016   LDLCALC 63 09/30/2016    Physical Findings: AIMS: Facial and Oral Movements Muscles of Facial Expression: None, normal Lips and Perioral Area: None, normal Jaw: None, normal Tongue: Moderate,Extremity Movements Upper (arms, wrists, hands, fingers): None, normal Lower (legs, knees, ankles, toes): None, normal, Trunk Movements Neck, shoulders, hips: None, normal, Overall Severity Severity of abnormal movements (highest score from questions above): None, normal Incapacitation due to abnormal movements: None, normal Patient's awareness of abnormal movements (rate only patient's report): No Awareness, Dental Status Current problems with teeth and/or dentures?: No Does patient usually wear dentures?: No  CIWA:    COWS:     Musculoskeletal: Strength & Muscle Tone: within normal limits Gait & Station: normal Patient leans: N/A  Psychiatric Specialty Exam:  Presentation  General Appearance: Casual  Eye Contact:Fair  Speech:Garbled  Speech Volume:Increased  Handedness:Right   Mood and Affect  Mood:Euthymic  Affect:Constricted  Thought Process  Thought Processes:Goal Directed  Descriptions of Associations:Loose  Orientation:-- (Oriented to person and place)  Thought Content:Perseveration  History of Schizophrenia/Schizoaffective disorder: Yes Duration of Psychotic Symptoms:Greater than 6 months Hallucinations:None Ideas of Reference:None  Suicidal Thoughts:Denies Homicidal Thoughts:Denies  Sensorium  Memory:Immediate Poor; Recent Poor; Remote Poor  Judgment:Impaired  Insight:Lacking   Executive Functions  Concentration:Poor  Attention  Span:Poor  Recall:Poor  Fund of Knowledge:Poor  Language:Poor   Psychomotor Activity  Psychomotor Activity:Normal  Assets  Assets:Desire for Improvement; Financial Resources/Insurance; Social Support   Sleep  Sleep:Fair, 5.25 hours   Physical Exam: Physical Exam  ROS  Blood pressure 123/83, pulse (!) 125, temperature 98.4 F (36.9 C), resp. rate 17, height 5\' 11"  (1.803 m), weight 75.8 kg, SpO2 99 %. Body mass index is 23.29 kg/m.   Treatment Plan Summary: Daily contact with patient to assess and evaluate symptoms and progress in treatment and Medication management 60 year old male with schizoaffective disorder, bipolar type and intellectual disability. Remains at psychiatric baseline, awaiting placement. Continue medications as above. [place] 2700, next CBC check due 06/10/20.  06/03/20: Psychiatric exam above reviewed and remains accurate. Assessment and plan above reviewed and updated.   Salley Scarlet, MD 06/03/2020, 12:48 PM

## 2020-06-03 NOTE — Progress Notes (Signed)
Pharmacy - Clozapine     This patient's order has been reviewed for prescribing contraindications.   Labs:  [facility] 4/6 2400 4/13 2700  The medication is being dispensed pursuant to the FDA REMS suspension order of 01/10/20 that allows for dispensing without a patient REMS dispense authorization (RDA).   Next CBC/diff 4/20.   Tawnya Crook, PharmD

## 2020-06-04 NOTE — Progress Notes (Signed)
Patient med compliant this evening.  Has no complaints and has been pleasant and cooperative. Very active on the unit watching TV in the dayroom and roaming the halls.  Denies SI HI AVH depression anxiety and pain. Encourage to come to staff with any concerns. Monitored with 15 minute safety rounds.     Cleo Butler-Nicholson, LPN

## 2020-06-04 NOTE — [facility] Group Notes (Signed)
LCSW Group Therapy Note  06/04/2020 2:08 PM  Type of Therapy/Topic:  Group Therapy:  Balance in Life  Participation Level:  Did Not Attend  Description of Group:    This group will address the concept of balance and how it feels and looks when one is unbalanced. Patients will be encouraged to process areas in their lives that are out of balance and identify reasons for remaining unbalanced. Facilitators will guide patients in utilizing problem-solving interventions to address and correct the stressor making their life unbalanced. Understanding and applying boundaries will be explored and addressed for obtaining and maintaining a balanced life. Patients will be encouraged to explore ways to assertively make their unbalanced needs known to significant others in their lives, using other group members and facilitator for support and feedback.  Therapeutic Goals: 1. Patient will identify two or more emotions or situations they have that consume much of in their lives. 2. Patient will identify signs/triggers that life has become out of balance:  3. Patient will identify two ways to set boundaries in order to achieve balance in their lives:  4. Patient will demonstrate ability to communicate their needs through discussion and/or role plays  Summary of Patient Progress: X  Therapeutic Modalities:   Cognitive Behavioral Therapy Solution-Focused Therapy Assertiveness Training  Hedy Camara R. Guerry Bruin, MSW, LCSW, [place] 06/04/2020 2:08 PM

## 2020-06-04 NOTE — Plan of Care (Signed)
Patient presents at his baseline   Problem: Education: Goal: Emotional status will improve Outcome: Not Progressing Goal: Mental status will improve Outcome: Not Progressing

## 2020-06-04 NOTE — Progress Notes (Signed)
Recreation Therapy Notes  Date: 06/04/2020  Time: 9:30 am  Location: Craft Room   Behavioral response: Appropriate   Intervention Topic: Animal Assisted Therapy   Discussion/Intervention:  Animal Assisted Therapy took place today during group.  Animal Assisted Therapy is the planned inclusion of an animal in a patient's treatment plan. The patients were able to engage in therapy with an animal during group. Participants were educated on what a service dog is and the different between a support dog and a service dog. Patient were informed on the many animal needs there are and how their needs are similar. Individuals were enlightened on the process to get a service animal or support animal. Patients got the opportunity to pet the animal and were offered emotional support from the animal and staff.  Clinical Observations/Feedback:  Patient came to group and was on topic and was focused on what peers and staff had to say. Participant shared their experiences and history with animals. Individual was social with peers, staff and animal while participating in group. Patient left group early to go to his room.  Lailoni Baquera LRT/CTRS         Miosha Behe 06/04/2020 12:49 PM

## 2020-06-04 NOTE — Progress Notes (Signed)
[facility] MD Progress Note  06/04/2020 10:22 AM Austin Gates  MRN:  [id number]   CC "When am I leaving?"  Subjective:  60 year old male with schizoaffective disorder bipolar type. No acute events overnight or today. Remains at baseline. Denies SI/HI/AH/VH. No changes to medications. CSW continues to work with guardian and outpatient care coordinator for completion of application for group home interested in accepting patient. No changes.    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 Hypertension   . Intellectual disability   . Obsessive-compulsive disorder   . Schizo-affective schizophrenia [facility])     Past Surgical History:  Procedure Laterality Date  . COLONOSCOPY WITH PROPOFOL N/A 12/28/2017   Procedure: COLONOSCOPY WITH PROPOFOL;  Surgeon: Jonathon Bellows, MD;  Location: [facility] ENDOSCOPY;  Service: Gastroenterology;  Laterality: N/A;  . HYDROCELE EXCISION Bilateral 02/22/2018   Procedure: bilateral HYDROCELECTOMY ADULT;  Surgeon: Cleon Gustin, MD;  Location: [place];  Service: Urology;  Laterality: Bilateral;  . INSERTION OF ILIAC STENT Left 02/22/2018   Procedure: INSERTION LEFT SUPERIOR FEMORAL ARTERY USING 6MM X 5CM VIABHON STENT with mynx device closure on right femoral artery;  Surgeon: Waynetta Sandy, MD;  Location: [place];  Service: Vascular;  Laterality: Left;  . KNEE ARTHROSCOPY    . SCROTAL EXPLORATION N/A 02/22/2018   Procedure: SCROTUM EXPLORATION;  Surgeon: Cleon Gustin, MD;  Location: [place];  Service: Urology;  Laterality: N/A;  . UPPER EXTREMITY ANGIOGRAM Left  02/22/2018   Procedure: left lower EXTREMITY ANGIOGRAM;  Surgeon: Waynetta Sandy, MD;  Location: [facility] OR;  Service: Vascular;  Laterality: Left;   Family History:  Family History  Problem Relation Age of Onset  . Diabetes Mother    Family Psychiatric  History: See H&P Social History:  Social History   Substance and Sexual Activity  Alcohol Use No     Social History   Substance and Sexual Activity  Drug Use No    Social History   Socioeconomic History  . Marital status: Single    Spouse name: Not on file  . Number of children: Not on file  . Years of education: Not on file  . Highest education level: Not on file  Occupational History  . Occupation: Disabled  Tobacco Use  . Smoking status: Former Smoker    Types: Cigarettes  . Smokeless tobacco: Never Used  Vaping Use  . Vaping Use: Unknown  Substance and Sexual Activity  . Alcohol use: No  . Drug use: No  . Sexual activity: Never  Other Topics Concern  . Not on file  Social History Narrative   ** Merged History Encounter **       Social Determinants of Health   Financial Resource Strain: Not on file  Food Insecurity: Not on file  Transportation Needs: Not on file  Physical Activity: Not on file  Stress: Not on file  Social Connections: Not on file   Additional Social History:                         Sleep: Good  Appetite:  Good  Current  Medications: Current Facility-Administered Medications  Medication Dose Route Frequency Provider Last Rate Last Admin  . acetaminophen (TYLENOL) tablet 650 mg  650 mg Oral Q6H PRN [name], John T, MD      . albuterol (VENTOLIN HFA) 108 (90 Base) MCG/ACT inhaler 2 puff  2 puff Inhalation Q4H PRN [name], John T, MD      . alum & mag hydroxide-simeth (MAALOX/MYLANTA) 200-200-20 MG/5ML suspension 30 mL  30 mL Oral Q4H PRN [name], [name], MD   30 mL at 06/01/20 2014  . benztropine (COGENTIN) tablet 0.5 mg  0.5 mg Oral BID [name], [name], MD   0.5 mg at  06/04/20 0833  . chlorproMAZINE (THORAZINE) injection 50 mg  50 mg Intramuscular TID PRN [name], John T, MD   50 mg at 05/19/20 1256   And  . diphenhydrAMINE (BENADRYL) injection 25 mg  25 mg Intramuscular TID PRN [name], [name], MD   25 mg at 05/19/20 1300  . clopidogrel (PLAVIX) tablet 75 mg  75 mg Oral Daily Salley Scarlet, MD   75 mg at 06/04/20 8676  . cloZAPine (CLOZARIL) tablet 200 mg  200 mg Oral Daily Salley Scarlet, MD   200 mg at 06/04/20 1950  . desmopressin (DDAVP) tablet 0.2 mg  0.2 mg Oral QHS Selina Cooley M, MD   0.2 mg at 06/03/20 2203  . haloperidol lactate (HALDOL) injection 5 mg  5 mg Intramuscular QID PRN Rulon Sera, MD   5 mg at 05/19/20 1440   And  . LORazepam (ATIVAN) injection 2 mg  2 mg Intramuscular Q4H PRN Rulon Sera, MD   2 mg at 05/19/20 1440   And  . diphenhydrAMINE (BENADRYL) injection 50 mg  50 mg Intramuscular Q4H PRN Rulon Sera, MD   50 mg at 05/18/20 1229  . docusate sodium (COLACE) capsule 100 mg  100 mg Oral BID PRN [name], [name], MD   100 mg at 05/17/20 0803  . ferrous sulfate tablet 325 mg  325 mg Oral Q breakfast [name], [name], MD   325 mg at 06/04/20 0834  . food thickener (THICK IT) powder   Oral PRN [name], John T, MD      . haloperidol (HALDOL) tablet 10 mg  10 mg Oral BID [name], [name], MD   10 mg at 06/04/20 9326  . haloperidol lactate (HALDOL) injection 5 mg  5 mg Intramuscular Once Rulon Sera, MD      . hydrocerin (EUCERIN) cream   Topical BID Salley Scarlet, MD   1 application at 71/24/58 [phone]  . LORazepam (ATIVAN) tablet 1 mg  1 mg Oral BID Salley Scarlet, MD   1 mg at 06/04/20 0834  . magnesium hydroxide (MILK OF MAGNESIA) suspension 30 mL  30 mL Oral Daily PRN [name], John T, MD      . melatonin tablet 5 mg  5 mg Oral QHS PRN [name], [name], MD   5 mg at 06/02/20 2103  . midodrine (PROAMATINE) tablet 10 mg  10 mg Oral TID AC [name], [name], MD   10 mg at 06/04/20 0834  . neomycin-bacitracin-polymyxin (NEOSPORIN)  ointment packet   Topical BID Salley Scarlet, MD   1 application at 33/82/50 [phone]  . nicotine (NICODERM CQ - dosed in mg/24 hours) patch 21 mg  21 mg Transdermal Daily [name], [name], MD   21 mg at 06/04/20 0837  . OLANZapine zydis (ZYPREXA) disintegrating tablet 10 mg  10 mg Oral QID  PRN Rulon Sera, MD   10 mg at 05/25/20 0902  . ondansetron (ZOFRAN) tablet 4 mg  4 mg Oral Q8H PRN Salley Scarlet, MD      . polyethylene glycol (MIRALAX / GLYCOLAX) packet 17 g  17 g Oral Daily PRN [name], John T, MD      . traZODone (DESYREL) tablet 100 mg  100 mg Oral QHS PRN Caroline Sauger, NP   100 mg at 06/03/20 2201  . valproic acid (DEPAKENE) 250 MG/5ML solution 750 mg  750 mg Oral QID Salley Scarlet, MD   750 mg at 06/04/20 0835  . zolpidem (AMBIEN) tablet 5 mg  5 mg Oral QHS Salley Scarlet, MD   5 mg at 06/03/20 2202    Lab Results:  Results for orders placed or performed during the hospital encounter of 05/07/20 (from the past 48 hour(s))  CBC with Differential/Platelet     Status: Abnormal   Collection Time: 06/03/20  7:23 AM  Result Value Ref Range   WBC 4.3 4.0 - 10.5 K/uL   RBC 4.11 (L) 4.22 - 5.81 MIL/uL   Hemoglobin 12.0 (L) 13.0 - 17.0 g/dL   HCT 37.9 (L) 39.0 - 52.0 %   MCV 92.2 80.0 - 100.0 fL   MCH 29.2 26.0 - 34.0 pg   MCHC 31.7 30.0 - 36.0 g/dL   RDW 15.9 (H) 11.5 - 15.5 %   Platelets 163 150 - 400 K/uL   nRBC 0.5 (H) 0.0 - 0.2 %   Neutrophils Relative % 61 %   Neutro Abs 2.7 1.7 - 7.7 K/uL   Lymphocytes Relative 23 %   Lymphs Abs 1.0 0.7 - 4.0 K/uL   Monocytes Relative 11 %   Monocytes Absolute 0.5 0.1 - 1.0 K/uL   Eosinophils Relative 2 %   Eosinophils Absolute 0.1 0.0 - 0.5 K/uL   Basophils Relative 1 %   Basophils Absolute 0.0 0.0 - 0.1 K/uL   Immature Granulocytes 2 %   Abs Immature Granulocytes 0.08 (H) 0.00 - 0.07 K/uL    Comment: Performed at [facility], [address] [place], [place] [address]    Blood Alcohol level:  Lab Results   Component Value Date   [facility] <10 04/30/2020   ETH <10 66/44/0347    Metabolic Disorder Labs: Lab Results  Component Value Date   HGBA1C 5.4 09/30/2016   MPG 108.28 09/30/2016   MPG 114 09/17/2016   Lab Results  Component Value Date   PROLACTIN 11.8 05/01/2020   Lab Results  Component Value Date   CHOL 135 09/30/2016   TRIG 104 02/25/2018   HDL 60 09/30/2016   CHOLHDL 2.3 09/30/2016   VLDL 12 09/30/2016   LDLCALC 63 09/30/2016    Physical Findings: AIMS: Facial and Oral Movements Muscles of Facial Expression: None, normal Lips and Perioral Area: None, normal Jaw: None, normal Tongue: Moderate,Extremity Movements Upper (arms, wrists, hands, fingers): None, normal Lower (legs, knees, ankles, toes): None, normal, Trunk Movements Neck, shoulders, hips: None, normal, Overall Severity Severity of abnormal movements (highest score from questions above): None, normal Incapacitation due to abnormal movements: None, normal Patient's awareness of abnormal movements (rate only patient's report): No Awareness, Dental Status Current problems with teeth and/or dentures?: No Does patient usually wear dentures?: No  CIWA:    COWS:     Musculoskeletal: Strength & Muscle Tone: within normal limits Gait & Station: normal Patient leans: N/A  Psychiatric Specialty Exam:  Presentation  General Appearance: Casual  Eye Contact:Fair  Speech:Garbled  Speech Volume:Increased  Handedness:Right   Mood and Affect  Mood:Euthymic  Affect:Constricted   Thought Process  Thought Processes:Goal Directed  Descriptions of Associations:Loose  Orientation:-- (Oriented to person and place)  Thought Content:Perseveration  History of Schizophrenia/Schizoaffective disorder: Yes Duration of Psychotic Symptoms:Greater than 6 months Hallucinations:None Ideas of Reference:None  Suicidal Thoughts:Denies Homicidal Thoughts:Denies  Sensorium  Memory:Immediate Poor; Recent Poor;  Remote Poor  Judgment:Impaired  Insight:Lacking   Executive Functions  Concentration:Poor  Attention Span:Poor  Recall:Poor  Fund of Knowledge:Poor  Language:Poor   Psychomotor Activity  Psychomotor Activity:Normal  Assets  Assets:Desire for Improvement; Financial Resources/Insurance; Social Support   Sleep  Sleep:Fair, 5.25 hours   Physical Exam: Physical Exam  ROS  Blood pressure 111/88, pulse (!) 114, temperature 98.4 F (36.9 C), resp. rate 17, height 5\' 11"  (1.803 m), weight 75.8 kg, SpO2 100 %. Body mass index is 23.29 kg/m.   Treatment Plan Summary: Daily contact with patient to assess and evaluate symptoms and progress in treatment and Medication management 60 year old male with schizoaffective disorder, bipolar type and intellectual disability. Remains at psychiatric baseline, awaiting placement. Continue medications as above. [place] 2700, next CBC check due 06/10/20.  06/04/20: Psychiatric exam above reviewed and remains accurate. Assessment and plan above reviewed and updated.    Salley Scarlet, MD 06/04/2020, 10:22 AM

## 2020-06-04 NOTE — Progress Notes (Signed)
Pt is alert and oriented to person, place, time and situation. Pt is calm, cooperative, denies suicidal and homicidal ideation, denies hallucinations, denies feelings of depression and anxiety. Pt attends groups, reports he slept well, appetite is good, pt attends groups, and pt is social with staff and peers. Pt is pleasant, voices no complaints, no distress noted, or reported. Will continue to monitor pt per Q15 minute face checks and monitor for safety and progress.

## 2020-06-04 NOTE — Progress Notes (Signed)
Patient presents at his baseline. Patient calm and compliant with procedures on the unit. Patient denies SI/HI/AVH. Patient stated to this writer that he is ready to go. Pt given education, support, and encouragement to be active in his treatment plan. Patient compliant with medication administration per MD orders. Pt remains safe on the unit. Patient observed by this Probation officer interacting appropriately with staff and peers on the unit.

## 2020-06-04 NOTE — Plan of Care (Signed)
  Problem: Education: Goal: Emotional status will improve Outcome: Progressing   Problem: Education: Goal: Mental status will improve Outcome: Progressing   Problem: Activity: Goal: Sleeping patterns will improve Outcome: Progressing   Problem: Coping: Goal: Ability to demonstrate self-control will improve Outcome: Progressing

## 2020-06-04 NOTE — Plan of Care (Signed)
  Problem: Disruptive/Impulsive Goal: STG - Patient will focus on task/topic with 2 prompts from staff within 5 recreation therapy group sessions Description: STG - Patient will focus on task/topic with 2 prompts from staff within 5 recreation therapy group sessions Outcome: Progressing

## 2020-06-05 NOTE — Progress Notes (Signed)
Patient is calm and cooperative with assessment. Patient denies SI, HI, and AVH. He repeatedly asks when he is going home. Patient is easily redirectable. Patient is appropriate with staff and peers on the unit. Patient stated "my feet are good" and did not want cream or neosporin on his foot, but was compliant with other medications. Support and encouragement was provided to patient. Patient remains safe on the unit at this time and q15 min safety checks are maintained.

## 2020-06-05 NOTE — Progress Notes (Signed)
Recreation Therapy Notes    Date: 06/05/2020  Time: 9:30 am  Location: Courtyard   Behavioral response: Appropriate   Intervention Topic: Coping-Skills   Discussion/Intervention:  Group content on today was focused on social skills. The group defined social skills and identified ways they use social skills. Patients expressed what obstacles they face when trying to be social. Participants described the importance of social skills. The group listed ways to improve social skills and reasons to improve social skills. Individuals had an opportunity to learn new and improve social skills as well as identify their weaknesses. Clinical Observations/Feedback:  Patient came to group and stated that coping skills is independent living skills. He stated that he smokes cigarettes to cope with things in life. Individual was social with peers and staff while participating in the intervention.   [name] LRT/CTRS            Austin Gates 06/05/2020 10:50 AM

## 2020-06-05 NOTE — Progress Notes (Signed)
[facility] MD Progress Note  06/05/2020 12:56 PM Austin Gates  MRN:  [id number]   CC "Am I leaving today?"  Subjective:  60 year old male with schizoaffective disorder bipolar type. No acute events, remains at baseline. Continues to deny SI/HI/AH/VH.  CSW continues to work with guardian and outpatient care coordinator for completion of application for group home interested in accepting patient. No changes.     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 Hypertension   . Intellectual disability   . Obsessive-compulsive disorder   . Schizo-affective schizophrenia [facility])     Past Surgical History:  Procedure Laterality Date  . COLONOSCOPY WITH PROPOFOL N/A 12/28/2017   Procedure: COLONOSCOPY WITH PROPOFOL;  Surgeon: Jonathon Bellows, MD;  Location: [facility] ENDOSCOPY;  Service: Gastroenterology;  Laterality: N/A;  . HYDROCELE EXCISION Bilateral 02/22/2018   Procedure: bilateral HYDROCELECTOMY ADULT;  Surgeon: Cleon Gustin, MD;  Location: [place];  Service: Urology;  Laterality: Bilateral;  . INSERTION OF ILIAC STENT Left 02/22/2018   Procedure: INSERTION LEFT SUPERIOR FEMORAL ARTERY USING 6MM X 5CM VIABHON STENT with mynx device closure on right femoral artery;  Surgeon: Waynetta Sandy, MD;  Location: [place];  Service: Vascular;  Laterality: Left;  . KNEE ARTHROSCOPY    . SCROTAL EXPLORATION N/A 02/22/2018   Procedure: SCROTUM EXPLORATION;  Surgeon: Cleon Gustin, MD;  Location: [place];  Service: Urology;  Laterality: N/A;  . UPPER EXTREMITY ANGIOGRAM Left 02/22/2018   Procedure: left  lower EXTREMITY ANGIOGRAM;  Surgeon: Waynetta Sandy, MD;  Location[facility] OR;  Service: Vascular;  Laterality: Left;   Family History:  Family History  Problem Relation Age of Onset  . Diabetes Mother    Family Psychiatric  History: See H&P Social History:  Social History   Substance and Sexual Activity  Alcohol Use No     Social History   Substance and Sexual Activity  Drug Use No    Social History   Socioeconomic History  . Marital status: Single    Spouse name: Not on file  . Number of children: Not on file  . Years of education: Not on file  . Highest education level: Not on file  Occupational History  . Occupation: Disabled  Tobacco Use  . Smoking status: Former Smoker    Types: Cigarettes  . Smokeless tobacco: Never Used  Vaping Use  . Vaping Use: Unknown  Substance and Sexual Activity  . Alcohol use: No  . Drug use: No  . Sexual activity: Never  Other Topics Concern  . Not on file  Social History Narrative   ** Merged History Encounter **       Social Determinants of Health   Financial Resource Strain: Not on file  Food Insecurity: Not on file  Transportation Needs: Not on file  Physical Activity: Not on file  Stress: Not on file  Social Connections: Not on file   Additional Social History:                         Sleep: Good  Appetite:  Good  Current Medications: Current Facility-Administered  Medications  Medication Dose Route Frequency Provider Last Rate Last Admin  . acetaminophen (TYLENOL) tablet 650 mg  650 mg Oral Q6H PRN [name], John T, MD      . albuterol (VENTOLIN HFA) 108 (90 Base) MCG/ACT inhaler 2 puff  2 puff Inhalation Q4H PRN [name], John T, MD      . alum & mag hydroxide-simeth (MAALOX/MYLANTA) 200-200-20 MG/5ML suspension 30 mL  30 mL Oral Q4H PRN [name], [name], MD   30 mL at 06/01/20 2014  . benztropine (COGENTIN) tablet 0.5 mg  0.5 mg Oral BID [name], [name], MD   0.5 mg at 06/05/20 0909  .  chlorproMAZINE (THORAZINE) injection 50 mg  50 mg Intramuscular TID PRN [name], John T, MD   50 mg at 05/19/20 1256   And  . diphenhydrAMINE (BENADRYL) injection 25 mg  25 mg Intramuscular TID PRN [name], [name], MD   25 mg at 05/19/20 1300  . clopidogrel (PLAVIX) tablet 75 mg  75 mg Oral Daily Salley Scarlet, MD   75 mg at 06/05/20 0908  . cloZAPine (CLOZARIL) tablet 200 mg  200 mg Oral Daily Salley Scarlet, MD   200 mg at 06/05/20 0908  . desmopressin (DDAVP) tablet 0.2 mg  0.2 mg Oral QHS Selina Cooley M, MD   0.2 mg at 06/04/20 2121  . haloperidol lactate (HALDOL) injection 5 mg  5 mg Intramuscular QID PRN Rulon Sera, MD   5 mg at 05/19/20 1440   And  . LORazepam (ATIVAN) injection 2 mg  2 mg Intramuscular Q4H PRN Rulon Sera, MD   2 mg at 05/19/20 1440   And  . diphenhydrAMINE (BENADRYL) injection 50 mg  50 mg Intramuscular Q4H PRN Rulon Sera, MD   50 mg at 05/18/20 1229  . docusate sodium (COLACE) capsule 100 mg  100 mg Oral BID PRN [name], [name], MD   100 mg at 05/17/20 0803  . ferrous sulfate tablet 325 mg  325 mg Oral Q breakfast [name], [name], MD   325 mg at 06/05/20 0908  . food thickener (THICK IT) powder   Oral PRN [name], John T, MD      . haloperidol (HALDOL) tablet 10 mg  10 mg Oral BID [name], [name], MD   10 mg at 06/05/20 0908  . haloperidol lactate (HALDOL) injection 5 mg  5 mg Intramuscular Once Rulon Sera, MD      . hydrocerin (EUCERIN) cream   Topical BID Salley Scarlet, MD   1 application at 44/01/02 1712  . LORazepam (ATIVAN) tablet 1 mg  1 mg Oral BID Salley Scarlet, MD   1 mg at 06/05/20 0908  . magnesium hydroxide (MILK OF MAGNESIA) suspension 30 mL  30 mL Oral Daily PRN [name], John T, MD      . melatonin tablet 5 mg  5 mg Oral QHS PRN [name], [name], MD   5 mg at 06/04/20 2121  . midodrine (PROAMATINE) tablet 10 mg  10 mg Oral TID AC [name], John T, MD   10 mg at 06/05/20 1140  . neomycin-bacitracin-polymyxin (NEOSPORIN) ointment packet    Topical BID Salley Scarlet, MD   1 application at 72/53/66 1712  . nicotine (NICODERM CQ - dosed in mg/24 hours) patch 21 mg  21 mg Transdermal Daily [name], [name], MD   21 mg at 06/05/20 0920  . OLANZapine zydis (ZYPREXA) disintegrating tablet 10 mg  10 mg Oral QID PRN Rulon Sera,  MD   10 mg at 05/25/20 0902  . ondansetron (ZOFRAN) tablet 4 mg  4 mg Oral Q8H PRN Salley Scarlet, MD      . polyethylene glycol (MIRALAX / GLYCOLAX) packet 17 g  17 g Oral Daily PRN [name], John T, MD      . traZODone (DESYREL) tablet 100 mg  100 mg Oral QHS PRN Caroline Sauger, NP   100 mg at 06/04/20 2121  . valproic acid (DEPAKENE) 250 MG/5ML solution 750 mg  750 mg Oral QID Salley Scarlet, MD   750 mg at 06/05/20 1140  . zolpidem (AMBIEN) tablet 5 mg  5 mg Oral QHS Salley Scarlet, MD   5 mg at 06/04/20 2121    Lab Results:  No results found for this or any previous visit (from the past 48 hour(s)).  Blood Alcohol level:  Lab Results  Component Value Date   ETH <10 04/30/2020   ETH <10 78/29/5621    Metabolic Disorder Labs: Lab Results  Component Value Date   HGBA1C 5.4 09/30/2016   MPG 108.28 09/30/2016   MPG 114 09/17/2016   Lab Results  Component Value Date   PROLACTIN 11.8 05/01/2020   Lab Results  Component Value Date   CHOL 135 09/30/2016   TRIG 104 02/25/2018   HDL 60 09/30/2016   CHOLHDL 2.3 09/30/2016   VLDL 12 09/30/2016   LDLCALC 63 09/30/2016    Physical Findings: AIMS: Facial and Oral Movements Muscles of Facial Expression: None, normal Lips and Perioral Area: None, normal Jaw: None, normal Tongue: Moderate,Extremity Movements Upper (arms, wrists, hands, fingers): None, normal Lower (legs, knees, ankles, toes): None, normal, Trunk Movements Neck, shoulders, hips: None, normal, Overall Severity Severity of abnormal movements (highest score from questions above): None, normal Incapacitation due to abnormal movements: None, normal Patient's awareness of  abnormal movements (rate only patient's report): No Awareness, Dental Status Current problems with teeth and/or dentures?: No Does patient usually wear dentures?: No  CIWA:    COWS:     Musculoskeletal: Strength & Muscle Tone: within normal limits Gait & Station: normal Patient leans: N/A  Psychiatric Specialty Exam:  Presentation  General Appearance: Casual  Eye Contact:Fair  Speech:Garbled  Speech Volume:Increased  Handedness:Right   Mood and Affect  Mood:Euthymic  Affect:Constricted   Thought Process  Thought Processes:Goal Directed  Descriptions of Associations:Loose  Orientation:-- (Oriented to person and place)  Thought Content:Perseveration  History of Schizophrenia/Schizoaffective disorder: Yes Duration of Psychotic Symptoms:Greater than 6 months Hallucinations:None Ideas of Reference:None  Suicidal Thoughts:Denies Homicidal Thoughts:Denies  Sensorium  Memory:Immediate Poor; Recent Poor; Remote Poor  Judgment:Impaired  Insight:Lacking   Executive Functions  Concentration:Poor  Attention Span:Poor  Recall:Poor  Fund of Knowledge:Poor  Language:Poor   Psychomotor Activity  Psychomotor Activity:Normal  Assets  Assets:Desire for Improvement; Financial Resources/Insurance; Social Support   Sleep  Sleep:Fair, 7 hours   Physical Exam: Physical Exam  ROS  Blood pressure (!) 121/56, pulse (!) 106, temperature 98 F (36.7 C), temperature source Oral, resp. rate 18, height 5\' 11"  (1.803 m), weight 75.8 kg, SpO2 100 %. Body mass index is 23.29 kg/m.   Treatment Plan Summary: Daily contact with patient to assess and evaluate symptoms and progress in treatment and Medication management 60 year old male with schizoaffective disorder, bipolar type and intellectual disability. Remains at psychiatric baseline, awaiting placement. Continue medications as above. [place] 2700 on 06/03/20, next CBC check due 06/10/20.  06/05/20:Psychiatric exam  above reviewed and remains accurate. Assessment and plan above reviewed  and updated.     Salley Scarlet, MD 06/05/2020, 12:56 PM

## 2020-06-06 NOTE — Progress Notes (Signed)
[facility] MD Progress Note  06/06/2020 10:45 AM Austin Gates  MRN:  [id number] Subjective: Follow-up 60 year old man with intellectual disability and bipolar or schizoaffective symptoms.  Patient's only complaint today is that he wants to know when he can go to a group home.  He is not reporting any hallucinations not reporting any suicidal thoughts and not acting out aggressively.  Staff report that his behavior has improved tremendously.  He is no longer intrusive or agitated or behaving in a dangerous manner.  Tolerating medicine well.  Last clozapine CBC check his ANC is staying stable.  No side effects reported from any medicine. 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 Hypertension   . Intellectual disability   . Obsessive-compulsive disorder   . Schizo-affective schizophrenia [facility])     Past Surgical History:  Procedure Laterality Date  . COLONOSCOPY WITH PROPOFOL N/A 12/28/2017   Procedure: COLONOSCOPY WITH PROPOFOL;  Surgeon: Jonathon Bellows, MD;  Location: [facility] ENDOSCOPY;  Service: Gastroenterology;  Laterality: N/A;  . HYDROCELE EXCISION Bilateral 02/22/2018   Procedure: bilateral HYDROCELECTOMY ADULT;  Surgeon: Cleon Gustin, MD;  Location: [place];  Service: Urology;  Laterality: Bilateral;  . INSERTION OF ILIAC STENT Left 02/22/2018   Procedure: INSERTION LEFT SUPERIOR FEMORAL ARTERY USING 6MM X 5CM VIABHON STENT with mynx device closure on right femoral artery;   Surgeon: Waynetta Sandy, MD;  Location: [place];  Service: Vascular;  Laterality: Left;  . KNEE ARTHROSCOPY    . SCROTAL EXPLORATION N/A 02/22/2018   Procedure: SCROTUM EXPLORATION;  Surgeon: Cleon Gustin, MD;  Location: [place];  Service: Urology;  Laterality: N/A;  . UPPER EXTREMITY ANGIOGRAM Left 02/22/2018   Procedure: left lower EXTREMITY ANGIOGRAM;  Surgeon: Waynetta Sandy, MD;  Location: [facility] OR;  Service: Vascular;  Laterality: Left;   Family History:  Family History  Problem Relation Age of Onset  . Diabetes Mother    Family Psychiatric  History: See previous Social History:  Social History   Substance and Sexual Activity  Alcohol Use No     Social History   Substance and Sexual Activity  Drug Use No    Social History   Socioeconomic History  . Marital status: Single    Spouse name: Not on file  . Number of children: Not on file  . Years of education: Not on file  . Highest education level: Not on file  Occupational History  . Occupation: Disabled  Tobacco Use  . Smoking status: Former Smoker    Types: Cigarettes  . Smokeless tobacco: Never Used  Vaping Use  . Vaping Use: Unknown  Substance and Sexual Activity  . Alcohol use: No  . Drug use: No  . Sexual activity: Never  Other Topics Concern  . Not on file  Social History Narrative   ** Merged History Encounter **       Social Determinants of Health   Financial Resource Strain: Not on file  Food Insecurity: Not on file  Transportation Needs: Not on file  Physical Activity: Not  on file  Stress: Not on file  Social Connections: Not on file   Additional Social History:                         Sleep: Fair  Appetite:  Fair  Current Medications: Current Facility-Administered Medications  Medication Dose Route Frequency Provider Last Rate Last Admin  . acetaminophen (TYLENOL) tablet 650 mg  650 mg Oral Q6H PRN Rahsaan Weakland T, MD      . albuterol (VENTOLIN HFA) 108  (90 Base) MCG/ACT inhaler 2 puff  2 puff Inhalation Q4H PRN Alizey Noren T, MD      . alum & mag hydroxide-simeth (MAALOX/MYLANTA) 200-200-20 MG/5ML suspension 30 mL  30 mL Oral Q4H PRN Darric Plante, [name], MD   30 mL at 06/01/20 2014  . benztropine (COGENTIN) tablet 0.5 mg  0.5 mg Oral BID Jaclynn Laumann, [name], MD   0.5 mg at 06/06/20 0821  . chlorproMAZINE (THORAZINE) injection 50 mg  50 mg Intramuscular TID PRN Beverley Allender T, MD   50 mg at 05/19/20 1256   And  . diphenhydrAMINE (BENADRYL) injection 25 mg  25 mg Intramuscular TID PRN Falan Hensler, [name], MD   25 mg at 05/19/20 1300  . clopidogrel (PLAVIX) tablet 75 mg  75 mg Oral Daily Salley Scarlet, MD   75 mg at 06/06/20 4163  . cloZAPine (CLOZARIL) tablet 200 mg  200 mg Oral Daily Salley Scarlet, MD   200 mg at 06/06/20 8453  . desmopressin (DDAVP) tablet 0.2 mg  0.2 mg Oral QHS Selina Cooley M, MD   0.2 mg at 06/05/20 2112  . haloperidol lactate (HALDOL) injection 5 mg  5 mg Intramuscular QID PRN Rulon Sera, MD   5 mg at 05/19/20 1440   And  . LORazepam (ATIVAN) injection 2 mg  2 mg Intramuscular Q4H PRN Rulon Sera, MD   2 mg at 05/19/20 1440   And  . diphenhydrAMINE (BENADRYL) injection 50 mg  50 mg Intramuscular Q4H PRN Rulon Sera, MD   50 mg at 05/18/20 1229  . docusate sodium (COLACE) capsule 100 mg  100 mg Oral BID PRN Kalif Kattner, [name], MD   100 mg at 05/17/20 0803  . ferrous sulfate tablet 325 mg  325 mg Oral Q breakfast Archer Moist, [name], MD   325 mg at 06/06/20 6468  . food thickener (THICK IT) powder   Oral PRN Meloney Feld T, MD      . haloperidol (HALDOL) tablet 10 mg  10 mg Oral BID Dejha King, [name], MD   10 mg at 06/06/20 0321  . haloperidol lactate (HALDOL) injection 5 mg  5 mg Intramuscular Once Rulon Sera, MD      . hydrocerin (EUCERIN) cream   Topical BID Salley Scarlet, MD   Given at 06/06/20 [phone]  . LORazepam (ATIVAN) tablet 1 mg  1 mg Oral BID Salley Scarlet, MD   1 mg at 06/06/20 2500  . magnesium hydroxide (MILK  OF MAGNESIA) suspension 30 mL  30 mL Oral Daily PRN Naseem Varden T, MD      . melatonin tablet 5 mg  5 mg Oral QHS PRN Solan Vosler, [name], MD   5 mg at 06/04/20 2121  . midodrine (PROAMATINE) tablet 10 mg  10 mg Oral TID AC Savannah Morford, [name], MD   10 mg at 06/06/20 3704  . neomycin-bacitracin-polymyxin (NEOSPORIN) ointment packet   Topical BID Salley Scarlet, MD  1 application at 56/31/49 0832  . nicotine (NICODERM CQ - dosed in mg/24 hours) patch 21 mg  21 mg Transdermal Daily Quantarius Genrich, [name], MD   21 mg at 06/06/20 0826  . OLANZapine zydis (ZYPREXA) disintegrating tablet 10 mg  10 mg Oral QID PRN Rulon Sera, MD   10 mg at 05/25/20 0902  . ondansetron (ZOFRAN) tablet 4 mg  4 mg Oral Q8H PRN Salley Scarlet, MD      . polyethylene glycol (MIRALAX / GLYCOLAX) packet 17 g  17 g Oral Daily PRN Anmol Paschen T, MD      . traZODone (DESYREL) tablet 100 mg  100 mg Oral QHS PRN Caroline Sauger, NP   100 mg at 06/04/20 2121  . valproic acid (DEPAKENE) 250 MG/5ML solution 750 mg  750 mg Oral QID Salley Scarlet, MD   750 mg at 06/06/20 7026  . zolpidem (AMBIEN) tablet 5 mg  5 mg Oral QHS Salley Scarlet, MD   5 mg at 06/05/20 2112    Lab Results: No results found for this or any previous visit (from the past 48 hour(s)).  Blood Alcohol level:  Lab Results  Component Value Date   ETH <10 04/30/2020   ETH <10 37/85/8850    Metabolic Disorder Labs: Lab Results  Component Value Date   HGBA1C 5.4 09/30/2016   MPG 108.28 09/30/2016   MPG 114 09/17/2016   Lab Results  Component Value Date   PROLACTIN 11.8 05/01/2020   Lab Results  Component Value Date   CHOL 135 09/30/2016   TRIG 104 02/25/2018   HDL 60 09/30/2016   CHOLHDL 2.3 09/30/2016   VLDL 12 09/30/2016   LDLCALC 63 09/30/2016    Physical Findings: AIMS: Facial and Oral Movements Muscles of Facial Expression: None, normal Lips and Perioral Area: None, normal Jaw: None, normal Tongue: Moderate,Extremity Movements Upper  (arms, wrists, hands, fingers): None, normal Lower (legs, knees, ankles, toes): None, normal, Trunk Movements Neck, shoulders, hips: None, normal, Overall Severity Severity of abnormal movements (highest score from questions above): None, normal Incapacitation due to abnormal movements: None, normal Patient's awareness of abnormal movements (rate only patient's report): No Awareness, Dental Status Current problems with teeth and/or dentures?: No Does patient usually wear dentures?: No  CIWA:    COWS:     Musculoskeletal: Strength & Muscle Tone: within normal limits Gait & Station: normal Patient leans: N/A  Psychiatric Specialty Exam:  Presentation  General Appearance: Casual  Eye Contact:Fair  Speech:Garbled  Speech Volume:Increased  Handedness:Right   Mood and Affect  Mood:Euthymic  Affect:Constricted   Thought Process  Thought Processes:Goal Directed  Descriptions of Associations:Loose  Orientation:-- (Oriented to person and place)  Thought Content:Perseveration  History of Schizophrenia/Schizoaffective disorder:No data recorded Duration of Psychotic Symptoms:No data recorded Hallucinations:No data recorded Ideas of Reference:None  Suicidal Thoughts:No data recorded Homicidal Thoughts:No data recorded  Sensorium  Memory:Immediate Poor; Recent Poor; Remote Poor  Judgment:Impaired  Insight:Lacking   Executive Functions  Concentration:Poor  Attention Span:Poor  Recall:Poor  Fund of Knowledge:Poor  Language:Poor   Psychomotor Activity  Psychomotor Activity:No data recorded  Assets  Assets:Desire for Improvement; Financial Resources/Insurance; Social Support   Sleep  Sleep:No data recorded   Physical Exam: Physical Exam Vitals and nursing note reviewed.  Constitutional:      Appearance: Normal appearance.  HENT:     Head: Normocephalic and atraumatic.     Mouth/Throat:     Pharynx: Oropharynx is clear.  Eyes:  Pupils:  Pupils are equal, round, and reactive to light.  Cardiovascular:     Rate and Rhythm: Normal rate and regular rhythm.  Pulmonary:     Effort: Pulmonary effort is normal.     Breath sounds: Normal breath sounds.  Abdominal:     General: Abdomen is flat.     Palpations: Abdomen is soft.  Musculoskeletal:        General: Normal range of motion.  Skin:    General: Skin is warm and dry.  Neurological:     General: No focal deficit present.     Mental Status: He is alert. Mental status is at baseline.  Psychiatric:        Attention and Perception: He is inattentive.        Mood and Affect: Affect is blunt.        Speech: Speech is delayed and tangential.        Behavior: Behavior is cooperative.        Thought Content: Thought content normal.        Cognition and Memory: Cognition is impaired. Memory is impaired.        Judgment: Judgment is impulsive.    Review of Systems  Constitutional: Negative.   HENT: Negative.   Eyes: Negative.   Respiratory: Negative.   Cardiovascular: Negative.   Gastrointestinal: Negative.   Musculoskeletal: Negative.   Skin: Negative.   Neurological: Negative.   Psychiatric/Behavioral: Negative.    Blood pressure 124/85, pulse 95, temperature 98 F (36.7 C), temperature source Oral, resp. rate 18, height 5\' 11"  (1.803 m), weight 75.8 kg, SpO2 97 %. Body mass index is 23.29 kg/m.   Treatment Plan Summary: Plan I reassured the patient after checking with social work that there should be no problem getting him to a group home we are simply waiting on the state to process all of their paperwork and we all know how slow that can be.  Told him I could not give him a date when it would be ready but assured him that it would happen eventually and in the meantime we were all working on taking care of his needs.  No other change to medicine or in plan today.  Supportive counseling completed.  Reviewed with nursing and staff.  Alethia Berthold, MD 06/06/2020,  10:45 AM

## 2020-06-06 NOTE — Plan of Care (Signed)
  Problem: Education: Goal: Knowledge of [facility] Education information/materials will improve Outcome: Progressing Goal: Emotional status will improve Outcome: Progressing Goal: Mental status will improve Outcome: Progressing Goal: Verbalization of understanding the information provided will improve Outcome: Progressing   Problem: Activity: Goal: Interest or engagement in activities will improve Outcome: Progressing Goal: Sleeping patterns will improve Outcome: Progressing   Problem: Coping: Goal: Ability to verbalize frustrations and anger appropriately will improve Outcome: Progressing Goal: Ability to demonstrate self-control will improve Outcome: Progressing   Problem: Health Behavior/Discharge Planning: Goal: Identification of resources available to assist in meeting health care needs will improve Outcome: Progressing Goal: Compliance with treatment plan for underlying cause of condition will improve Outcome: Progressing   Problem: Physical Regulation: Goal: Ability to maintain clinical measurements within normal limits will improve Outcome: Progressing   Problem: Safety: Goal: Periods of time without injury will increase Outcome: Progressing   Problem: Education: Goal: Ability to verbalize precipitating factors for violent behavior will improve Outcome: Progressing   Problem: Coping: Goal: Ability to verbalize frustrations and anger appropriately will improve Outcome: Progressing   Problem: Health Behavior/Discharge Planning: Goal: Ability to implement measures to prevent violent behavior in the future will improve Outcome: Progressing   Problem: Safety: Goal: Ability to demonstrate self-control will improve Outcome: Progressing Goal: Ability to redirect hostility and anger into socially appropriate behaviors will improve Outcome: Progressing

## 2020-06-06 NOTE — Progress Notes (Signed)
Patient denies SI, HI, and AVH. Patient is alert and oriented x4. He is pleasant and cooperative. Patient is interacting appropriately with peers and staff. Patient remains medication compliant. Support and encouragement provided. Patient remains safe on the unit at this time and q15 min safety checks are maintained.

## 2020-06-06 NOTE — [facility] Group Notes (Signed)
[facility] Group Notes:  (Nursing/MHT/Case Management/Adjunct)  Date:  06/06/2020  Time:  8:49 PM  Type of Therapy:  Group Therapy  Participation Level:  Active  Participation Quality:  Appropriate  Affect:  Appropriate  Cognitive:  Alert  Insight:  Good  Engagement in Group:  Engaged and goal was no touching women.  Modes of Intervention:  Support  Summary of Progress/Problems:  Nehemiah Settle 06/06/2020, 8:49 PM

## 2020-06-06 NOTE — BHH Counselor (Signed)
CSW attempted to contact Zipporah Plants, [facility], to discuss group home placement. CSW left a voicemail with contact information and callback request. CSW also contacted [name], legal guardian to follow up on group home placement. She reports that [facility] is completing all necessary paperwork needed for placement. No further action.   Signed:  Durenda Hurt, MSW, [place], LCASA 06/06/2020 2:56 PM

## 2020-06-06 NOTE — Progress Notes (Signed)
Patient pleasant and cooperative. No aggression

## 2020-06-06 NOTE — [facility] Group Notes (Signed)
LCSW Group Therapy Note  06/06/2020 1:39 PM  Type of Therapy and Topic:  Group Therapy: Avoiding Self-Sabotaging and Enabling Behaviors  Participation Level:  Did Not Attend   Description of Group:   In this group, patients will learn how to identify obstacles, self-sabotaging and enabling behaviors, as well as: what are they, why do we do them and what needs these behaviors meet. Discuss unhealthy relationships and how to have positive healthy boundaries with those that sabotage and enable. Explore aspects of self-sabotage and enabling in yourself and how to limit these self-destructive behaviors in everyday life.   Therapeutic Goals: 1. Patient will identify one obstacle that relates to self-sabotage and enabling behaviors 2. Patient will identify one personal self-sabotaging or enabling behavior they did prior to admission 3. Patient will state a plan to change the above identified behavior 4. Patient will demonstrate ability to communicate their needs through discussion and/or role play.   Summary of Patient Progress: Patient did not attend group despite encouraged participation.     Therapeutic Modalities:   Cognitive Behavioral Therapy Person-Centered Therapy Motivational Interviewing   Paulla Dolly, MSW, [place], [place] 06/06/2020 1:39 PM

## 2020-06-07 NOTE — Tx Team (Cosign Needed)
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 acetaminophen (TYLENOL) tablet 650 mg  650 mg Oral Q6H PRN [name], John T, MD      . albuterol (VENTOLIN HFA) 108 (90 Base) MCG/ACT inhaler 2 puff  2 puff Inhalation Q4H PRN [name], John T, MD      . alum & mag hydroxide-simeth (MAALOX/MYLANTA) 200-200-20 MG/5ML suspension 30 mL  30 mL Oral Q4H PRN [name], [name], MD   30 mL at 06/01/20 2014  . benztropine (COGENTIN) tablet 0.5 mg  0.5 mg Oral BID [name], [name], MD   0.5 mg at 06/07/20 0847  . chlorproMAZINE (THORAZINE) injection 50 mg  50 mg Intramuscular TID PRN [name], John T, MD   50 mg at 05/19/20 1256   And  . diphenhydrAMINE (BENADRYL) injection 25 mg  25 mg Intramuscular TID PRN [name], [name], MD   25 mg at 05/19/20 1300  . clopidogrel (PLAVIX) tablet 75 mg  75 mg Oral Daily Salley Scarlet, MD   75 mg at 06/07/20 0847  . cloZAPine (CLOZARIL) tablet 200 mg  200 mg Oral Daily Salley Scarlet, MD   200 mg at 06/07/20 0846  . desmopressin (DDAVP) tablet 0.2 mg  0.2 mg Oral QHS Selina Cooley M, MD   0.2 mg at 06/06/20 2106  . haloperidol lactate (HALDOL) injection 5 mg  5 mg Intramuscular QID PRN Rulon Sera, MD   5 mg at 05/19/20 1440   And  . LORazepam (ATIVAN) injection 2 mg  2 mg Intramuscular Q4H PRN Rulon Sera, MD   2 mg at 05/19/20 1440   And  . diphenhydrAMINE (BENADRYL) injection 50 mg  50 mg Intramuscular Q4H PRN Rulon Sera, MD   50 mg at  05/18/20 1229  . docusate sodium (COLACE) capsule 100 mg  100 mg Oral BID PRN [name], [name], MD   100 mg at 05/17/20 0803  . ferrous sulfate tablet 325 mg  325 mg Oral Q breakfast [name], [name], MD   325 mg at 06/07/20 0846  . food thickener (THICK IT) powder   Oral PRN [name], John T, MD      . haloperidol (HALDOL) tablet 10 mg  10 mg Oral BID [name], [name], MD   10 mg at 06/07/20 0847  . haloperidol lactate (HALDOL) injection 5 mg  5 mg Intramuscular Once Rulon Sera, MD      . hydrocerin (EUCERIN) cream   Topical BID Salley Scarlet, MD   Given at 06/07/20 [phone]  . LORazepam (ATIVAN) tablet 1 mg  1 mg Oral BID Salley Scarlet, MD   1 mg at 06/07/20 0850  . magnesium hydroxide (MILK OF MAGNESIA) suspension 30 mL  30 mL Oral Daily PRN [name], John T, MD      . melatonin tablet 5 mg  5 mg Oral QHS PRN [name], [name], MD   5 mg at 06/04/20 2121  . midodrine (PROAMATINE) tablet 10 mg  10 mg Oral TID AC [name], [name], MD   10  mg at 06/07/20 1142  . neomycin-bacitracin-polymyxin (NEOSPORIN) ointment packet   Topical BID Salley Scarlet, MD   1 application at 03/01/30 [phone]  . nicotine (NICODERM CQ - dosed in mg/24 hours) patch 21 mg  21 mg Transdermal Daily [name], [name], MD   21 mg at 06/07/20 0852  . OLANZapine zydis (ZYPREXA) disintegrating tablet 10 mg  10 mg Oral QID PRN Rulon Sera, MD   10 mg at 05/25/20 0902  . ondansetron (ZOFRAN) tablet 4 mg  4 mg Oral Q8H PRN Salley Scarlet, MD      . polyethylene glycol (MIRALAX / GLYCOLAX) packet 17 g  17 g Oral Daily PRN [name], John T, MD      . traZODone (DESYREL) tablet 100 mg  100 mg Oral QHS PRN Caroline Sauger, NP   100 mg at 06/04/20 2121  . valproic acid (DEPAKENE) 250 MG/5ML solution 750 mg  750 mg Oral QID Salley Scarlet, MD   750 mg at 06/07/20 1142  . zolpidem (AMBIEN) tablet 5 mg  5 mg Oral QHS Salley Scarlet, MD   5 mg at 06/06/20 2106   PTA Medications: Medications Prior to Admission  Medication Sig Dispense  Refill Last Dose  . benztropine (COGENTIN) 0.5 MG tablet Take 0.5 mg by mouth 2 (two) times daily.   05/07/2020 at Unknown time  . divalproex (DEPAKOTE) 500 MG DR tablet Take 500 mg by mouth 3 (three) times daily.     . Ensure (ENSURE) Take 237 mLs by mouth in the morning and at bedtime.     . ferrous sulfate 325 (65 FE) MG tablet Take 325 mg by mouth daily with breakfast.     . food thickener (THICK IT) POWD Take by mouth in the morning, at noon, and at bedtime.     Marland Kitchen lactulose (CHRONULAC) 10 GM/15ML solution Take 20 g by mouth daily.     . melatonin 3 MG TABS tablet Take 3 mg by mouth at bedtime as needed (sleep).     . midodrine (PROAMATINE) 10 MG tablet Take 10 mg by mouth 3 (three) times daily.       Patient Stressors: Health problems Marital or family conflict  Patient Strengths: Supportive family/friends  Treatment Modalities: Medication Management, Group therapy, Case management,  1 to 1 session with clinician, Psychoeducation, Recreational therapy.   Physician Treatment Plan for Primary Diagnosis: Schizoaffective disorder, bipolar type ([place]) Long Term Goal(s): Improvement in symptoms so as ready for discharge Improvement in symptoms so as ready for discharge   Short Term Goals: Ability to identify changes in lifestyle to reduce recurrence of condition will improve Ability to verbalize feelings will improve Ability to disclose and discuss suicidal ideas Ability to demonstrate self-control will improve Ability to identify and develop effective coping behaviors will improve Ability to maintain clinical measurements within normal limits will improve Compliance with prescribed medications will improve Ability to identify changes in lifestyle to reduce recurrence of condition will improve Ability to verbalize feelings will improve Ability to disclose and discuss suicidal ideas Ability to demonstrate self-control will improve Ability to identify and develop effective coping  behaviors will improve Ability to maintain clinical measurements within normal limits will improve Compliance with prescribed medications will improve Ability to identify triggers associated with substance abuse/mental health issues will improve  Medication Management: Evaluate patient's response, side effects, and tolerance of medication regimen.  Therapeutic Interventions: 1 to 1 sessions, Unit Group sessions and Medication administration.  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   Hypotension   Mild asthma without complication   Slow transit constipation  Long Term Goal(s): Improvement in symptoms so as ready for discharge Improvement in symptoms so as ready for discharge   Short Term Goals: Ability to identify changes in lifestyle to reduce recurrence of condition will improve Ability to verbalize feelings will improve Ability to disclose and discuss suicidal ideas Ability to demonstrate self-control will improve Ability to identify and develop effective coping behaviors will improve Ability to maintain clinical measurements within normal limits will improve Compliance with prescribed medications will improve Ability to identify changes in lifestyle to reduce recurrence of condition will improve Ability to verbalize feelings will improve Ability to disclose and discuss suicidal ideas Ability to demonstrate self-control will improve Ability to identify and develop effective coping behaviors will improve Ability to maintain clinical measurements within normal limits will improve Compliance with prescribed medications will improve Ability to identify triggers associated with substance abuse/mental health issues will improve     Medication Management: Evaluate patient's response, side  effects, and tolerance of medication regimen.  Therapeutic Interventions: 1 to 1 sessions, Unit Group sessions and Medication administration.  Evaluation of Outcomes: Progressing   RN Treatment Plan for Primary Diagnosis: Schizoaffective disorder, bipolar type ([place]) Long Term Goal(s): Knowledge of disease and therapeutic regimen to maintain health will improve  Short Term Goals: Ability to remain free from injury will improve, Ability to verbalize frustration and anger appropriately will improve, Ability to demonstrate self-control, Ability to participate in decision making will improve, Ability to verbalize feelings will improve, Ability to disclose and discuss suicidal ideas, Ability to identify and develop effective coping behaviors will improve and Compliance with prescribed medications will improve  Medication Management: RN will administer medications as ordered by provider, will assess and evaluate patient's response and provide education to patient for prescribed medication. RN will report any adverse and/or side effects to prescribing provider.  Therapeutic Interventions: 1 on 1 counseling sessions, Psychoeducation, Medication administration, Evaluate responses to treatment, Monitor vital signs and CBGs as ordered, Perform/monitor CIWA, COWS, AIMS and Fall Risk screenings as ordered, Perform wound care treatments as ordered.  Evaluation of Outcomes: Progressing   LCSW Treatment Plan for Primary Diagnosis: Schizoaffective disorder, bipolar type ([place]) Long Term Goal(s): Safe transition to appropriate next level of care at discharge, Engage patient in therapeutic group addressing interpersonal concerns.  Short Term Goals: Engage patient in aftercare planning with referrals and resources, Increase social support, Increase ability to appropriately verbalize feelings, Increase emotional regulation, Facilitate acceptance of mental health diagnosis and concerns, Identify triggers associated  with mental health/substance abuse issues and Increase skills for wellness and recovery  Therapeutic Interventions: Assess for all discharge needs, 1 to 1 time with Social worker, Explore available resources and support systems, Assess for adequacy in community support network, Educate family and significant other(s) on suicide prevention, Complete Psychosocial Assessment, Interpersonal group therapy.  Evaluation of Outcomes: Progressing   Progress in Treatment: Attending groups: Yes. Participating in groups: Yes. Taking medication as prescribed: Yes. Toleration medication: Yes. Family/Significant other contact made: Yes, individual(s) contacted:  [name], Guardian/Sister  Patient understands diagnosis: No. Discussing patient identified problems/goals with staff: Yes. Medical problems stabilized or resolved: Yes. Denies suicidal/homicidal ideation: Yes. Issues/concerns per patient self-inventory: No. Other: None   New problem(s) identified: No, Describe:  None  New Short Term/Long Term Goal(s): Elimination of symptoms of psychosis, medication management for mood stabilization; development of comprehensive mental wellness plan.  Update: 05/13/20, No changes at this time Update: 05/18/20, No changes at this time Update: 05/23/20, No changes at this time Update: 05/28/20, No changes at this time  Update: 06/02/20, No changes at this time Update: 06/07/20, No changes at this time.   Patient Goals: "I want to go home." Update: 05/13/20, No changes at this time Update: 05/18/20, No changes at this time Update: 05/23/20, No changes at this time Update: 05/28/20, No changes at this time  Update: 06/02/20, No changes at this time Update: 06/07/20, No changes at this time.   Discharge Plan or Barriers: CSW will assist with development of an appropriate discharge/aftercare plan. Patient will need placement and has a guardian. Update: 05/13/20, Patients behavior has become increasingly aggressive. Referral has  been made to [facility] and patient is on waiting list for [facility]. Update 05/18/20: Patient remains aggressive and isolated to back hall. Patient is on the wait list [place]. Update 05/23/20. Patient is no linger isolated to back hall. Patient is on the wait list for [place]. Update 05/28/20: Patient has potential group home placement. Update 06/02/20: Prior placement fell through, patient has an interview for potential placement on this day at 10:30 AM. Update: 06/07/20, No changes at this time.   Reason for Continuation of Hospitalization: Aggression Medical Issues Medication stabilization  Estimated Length of Stay:  Attendees: Patient: 06/07/2020 1:12 PM  Physician: Dr. Weber Cooks, MD 06/07/2020 1:12 PM  Nursing:  06/07/2020 1:12 PM  RN Care Manager: 06/07/2020 1:12 PM  Social Worker: Raina Mina, [place] 06/07/2020 1:12 PM  Recreational Therapist:  06/07/2020 1:12 PM  Other:  06/07/2020 1:12 PM  Other:  06/07/2020 1:12 PM  Other: 06/07/2020 1:12 PM    Scribe for Treatment Team: Raina Mina, [place] 06/07/2020 1:12 PM

## 2020-06-07 NOTE — Progress Notes (Signed)
[facility] MD Progress Note  06/07/2020 12:39 PM Marquell Saenz  MRN:  [id number] Subjective: Follow-up for this 60 year old man with schizoaffective disorder and intellectual disability.  No new complaints today.  Staff are satisfied with his behavior.  He is mostly taking care of all of his own ADLs and has been able to participate on the unit appropriately.  Eating well.  No recent falls.  In interview he has no complaints.  Asks as usual when he will be able to go to a group home. 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 Hypertension   . Intellectual disability   . Obsessive-compulsive disorder   . Schizo-affective schizophrenia [facility])     Past Surgical History:  Procedure Laterality Date  . COLONOSCOPY WITH PROPOFOL N/A 12/28/2017   Procedure: COLONOSCOPY WITH PROPOFOL;  Surgeon: Jonathon Bellows, MD;  Location: [facility] ENDOSCOPY;  Service: Gastroenterology;  Laterality: N/A;  . HYDROCELE EXCISION Bilateral 02/22/2018   Procedure: bilateral HYDROCELECTOMY ADULT;  Surgeon: Cleon Gustin, MD;  Location: [place];  Service: Urology;  Laterality: Bilateral;  . INSERTION OF ILIAC STENT Left 02/22/2018   Procedure: INSERTION LEFT SUPERIOR FEMORAL ARTERY USING 6MM X 5CM VIABHON STENT with mynx device closure on right femoral artery;  Surgeon: Waynetta Sandy, MD;  Location: [place];  Service: Vascular;  Laterality: Left;  . KNEE ARTHROSCOPY    . SCROTAL EXPLORATION N/A 02/22/2018   Procedure: SCROTUM EXPLORATION;  Surgeon: Cleon Gustin, MD;  Location: [place];  Service:  Urology;  Laterality: N/A;  . UPPER EXTREMITY ANGIOGRAM Left 02/22/2018   Procedure: left lower EXTREMITY ANGIOGRAM;  Surgeon: Waynetta Sandy, MD;  Location[facility] OR;  Service: Vascular;  Laterality: Left;   Family History:  Family History  Problem Relation Age of Onset  . Diabetes Mother    Family Psychiatric  History: See previous Social History:  Social History   Substance and Sexual Activity  Alcohol Use No     Social History   Substance and Sexual Activity  Drug Use No    Social History   Socioeconomic History  . Marital status: Single    Spouse name: Not on file  . Number of children: Not on file  . Years of education: Not on file  . Highest education level: Not on file  Occupational History  . Occupation: Disabled  Tobacco Use  . Smoking status: Former Smoker    Types: Cigarettes  . Smokeless tobacco: Never Used  Vaping Use  . Vaping Use: Unknown  Substance and Sexual Activity  . Alcohol use: No  . Drug use: No  . Sexual activity: Never  Other Topics Concern  . Not on file  Social History Narrative   ** Merged History Encounter **       Social Determinants of Health   Financial Resource Strain: Not on file  Food Insecurity: Not on file  Transportation Needs: Not on file  Physical Activity: Not on file  Stress: Not on file  Social Connections: Not on file   Additional Social History:  Sleep: Fair  Appetite:  Fair  Current Medications: Current Facility-Administered Medications  Medication Dose Route Frequency Provider Last Rate Last Admin  . acetaminophen (TYLENOL) tablet 650 mg  650 mg Oral Q6H PRN Ranveer Wahlstrom T, MD      . albuterol (VENTOLIN HFA) 108 (90 Base) MCG/ACT inhaler 2 puff  2 puff Inhalation Q4H PRN Kelyn Ponciano T, MD      . alum & mag hydroxide-simeth (MAALOX/MYLANTA) 200-200-20 MG/5ML suspension 30 mL  30 mL Oral Q4H PRN Jiovanny Burdell, [name], MD   30 mL at 06/01/20 2014  . benztropine  (COGENTIN) tablet 0.5 mg  0.5 mg Oral BID Hadriel Northup, [name], MD   0.5 mg at 06/07/20 0847  . chlorproMAZINE (THORAZINE) injection 50 mg  50 mg Intramuscular TID PRN Daneli Butkiewicz T, MD   50 mg at 05/19/20 1256   And  . diphenhydrAMINE (BENADRYL) injection 25 mg  25 mg Intramuscular TID PRN Monicka Cyran, [name], MD   25 mg at 05/19/20 1300  . clopidogrel (PLAVIX) tablet 75 mg  75 mg Oral Daily Salley Scarlet, MD   75 mg at 06/07/20 0847  . cloZAPine (CLOZARIL) tablet 200 mg  200 mg Oral Daily Salley Scarlet, MD   200 mg at 06/07/20 0846  . desmopressin (DDAVP) tablet 0.2 mg  0.2 mg Oral QHS Selina Cooley M, MD   0.2 mg at 06/06/20 2106  . haloperidol lactate (HALDOL) injection 5 mg  5 mg Intramuscular QID PRN Rulon Sera, MD   5 mg at 05/19/20 1440   And  . LORazepam (ATIVAN) injection 2 mg  2 mg Intramuscular Q4H PRN Rulon Sera, MD   2 mg at 05/19/20 1440   And  . diphenhydrAMINE (BENADRYL) injection 50 mg  50 mg Intramuscular Q4H PRN Rulon Sera, MD   50 mg at 05/18/20 1229  . docusate sodium (COLACE) capsule 100 mg  100 mg Oral BID PRN Micky Overturf, [name], MD   100 mg at 05/17/20 0803  . ferrous sulfate tablet 325 mg  325 mg Oral Q breakfast Isley Weisheit, [name], MD   325 mg at 06/07/20 0846  . food thickener (THICK IT) powder   Oral PRN Jaritza Duignan T, MD      . haloperidol (HALDOL) tablet 10 mg  10 mg Oral BID Jaxiel Kines, [name], MD   10 mg at 06/07/20 0847  . haloperidol lactate (HALDOL) injection 5 mg  5 mg Intramuscular Once Rulon Sera, MD      . hydrocerin (EUCERIN) cream   Topical BID Salley Scarlet, MD   Given at 06/07/20 [phone]  . LORazepam (ATIVAN) tablet 1 mg  1 mg Oral BID Salley Scarlet, MD   1 mg at 06/07/20 0850  . magnesium hydroxide (MILK OF MAGNESIA) suspension 30 mL  30 mL Oral Daily PRN Younes Degeorge T, MD      . melatonin tablet 5 mg  5 mg Oral QHS PRN Alexanderjames Berg, [name], MD   5 mg at 06/04/20 2121  . midodrine (PROAMATINE) tablet 10 mg  10 mg Oral TID AC Kesha Hurrell, [name], MD   10  mg at 06/07/20 1142  . neomycin-bacitracin-polymyxin (NEOSPORIN) ointment packet   Topical BID Salley Scarlet, MD   1 application at 88/50/27 [phone]  . nicotine (NICODERM CQ - dosed in mg/24 hours) patch 21 mg  21 mg Transdermal Daily Yessika Otte, [name], MD   21 mg at 06/07/20 0852  . OLANZapine zydis (ZYPREXA) disintegrating tablet  10 mg  10 mg Oral QID PRN Rulon Sera, MD   10 mg at 05/25/20 0902  . ondansetron (ZOFRAN) tablet 4 mg  4 mg Oral Q8H PRN Salley Scarlet, MD      . polyethylene glycol (MIRALAX / GLYCOLAX) packet 17 g  17 g Oral Daily PRN Keyonni Percival T, MD      . traZODone (DESYREL) tablet 100 mg  100 mg Oral QHS PRN Caroline Sauger, NP   100 mg at 06/04/20 2121  . valproic acid (DEPAKENE) 250 MG/5ML solution 750 mg  750 mg Oral QID Salley Scarlet, MD   750 mg at 06/07/20 1142  . zolpidem (AMBIEN) tablet 5 mg  5 mg Oral QHS Salley Scarlet, MD   5 mg at 06/06/20 2106    Lab Results: No results found for this or any previous visit (from the past 48 hour(s)).  Blood Alcohol level:  Lab Results  Component Value Date   ETH <10 04/30/2020   ETH <10 02/72/5366    Metabolic Disorder Labs: Lab Results  Component Value Date   HGBA1C 5.4 09/30/2016   MPG 108.28 09/30/2016   MPG 114 09/17/2016   Lab Results  Component Value Date   PROLACTIN 11.8 05/01/2020   Lab Results  Component Value Date   CHOL 135 09/30/2016   TRIG 104 02/25/2018   HDL 60 09/30/2016   CHOLHDL 2.3 09/30/2016   VLDL 12 09/30/2016   LDLCALC 63 09/30/2016    Physical Findings: AIMS: Facial and Oral Movements Muscles of Facial Expression: None, normal Lips and Perioral Area: None, normal Jaw: None, normal Tongue: Moderate,Extremity Movements Upper (arms, wrists, hands, fingers): None, normal Lower (legs, knees, ankles, toes): None, normal, Trunk Movements Neck, shoulders, hips: None, normal, Overall Severity Severity of abnormal movements (highest score from questions above): None,  normal Incapacitation due to abnormal movements: None, normal Patient's awareness of abnormal movements (rate only patient's report): No Awareness, Dental Status Current problems with teeth and/or dentures?: No Does patient usually wear dentures?: No  CIWA:    COWS:     Musculoskeletal: Strength & Muscle Tone: within normal limits Gait & Station: normal Patient leans: N/A  Psychiatric Specialty Exam:  Presentation  General Appearance: Casual  Eye Contact:Fair  Speech:Garbled  Speech Volume:Increased  Handedness:Right   Mood and Affect  Mood:Euthymic  Affect:Constricted   Thought Process  Thought Processes:Goal Directed  Descriptions of Associations:Loose  Orientation:-- (Oriented to person and place)  Thought Content:Perseveration  History of Schizophrenia/Schizoaffective disorder:No data recorded Duration of Psychotic Symptoms:No data recorded Hallucinations:No data recorded Ideas of Reference:None  Suicidal Thoughts:No data recorded Homicidal Thoughts:No data recorded  Sensorium  Memory:Immediate Poor; Recent Poor; Remote Poor  Judgment:Impaired  Insight:Lacking   Executive Functions  Concentration:Poor  Attention Span:Poor  Recall:Poor  Fund of Knowledge:Poor  Language:Poor   Psychomotor Activity  Psychomotor Activity:No data recorded  Assets  Assets:Desire for Improvement; Financial Resources/Insurance; Social Support   Sleep  Sleep:No data recorded   Physical Exam: Physical Exam Vitals and nursing note reviewed.  Constitutional:      Appearance: Normal appearance.  HENT:     Head: Normocephalic and atraumatic.     Mouth/Throat:     Pharynx: Oropharynx is clear.  Eyes:     Pupils: Pupils are equal, round, and reactive to light.  Cardiovascular:     Rate and Rhythm: Normal rate and regular rhythm.  Pulmonary:     Effort: Pulmonary effort is normal.     Breath sounds: Normal  breath sounds.  Abdominal:     General:  Abdomen is flat.     Palpations: Abdomen is soft.  Musculoskeletal:        General: Normal range of motion.  Skin:    General: Skin is warm and dry.  Neurological:     General: No focal deficit present.     Mental Status: He is alert. Mental status is at baseline.  Psychiatric:        Attention and Perception: Attention normal.        Mood and Affect: Mood normal. Affect is blunt.        Speech: Speech is delayed.        Behavior: Behavior is slowed.        Thought Content: Thought content normal.        Cognition and Memory: Cognition is impaired.        Judgment: Judgment is impulsive.    Review of Systems  Constitutional: Negative.   HENT: Negative.   Eyes: Negative.   Respiratory: Negative.   Cardiovascular: Negative.   Gastrointestinal: Negative.   Musculoskeletal: Negative.   Skin: Negative.   Neurological: Negative.   Psychiatric/Behavioral: Negative.    Blood pressure 122/83, pulse 94, temperature 97.8 F (36.6 C), temperature source Oral, resp. rate 18, height 5\' 11"  (1.803 m), weight 75.8 kg, SpO2 100 %. Body mass index is 23.29 kg/m.   Treatment Plan Summary: Plan Supportive counseling.  Reassurance.  Praised his fine behavior.  Made sure he knew that he could use his words to ask for anything he needed.  Reminded him that we would do not yet know when a facility will be available but it looks like the chances are very good that it may happen within the next week.  No change to treatment plan  Alethia Berthold, MD 06/07/2020, 12:39 PM

## 2020-06-07 NOTE — Progress Notes (Signed)
The patient has been calm and cooperative during this shift. Patient denies, SI/HI, AVH. He has asked about easter candy today and when he is leaving. The patient has taken his medications without any issues. He has been sleeping a lot today, coming out for meals and minimal interaction with peers. We will continue to support and monitor the patient, while continually assessing his needs. 15 min safety checks are continuous.

## 2020-06-07 NOTE — Plan of Care (Signed)
  Problem: Education: Goal: Knowledge of [facility] Education information/materials will improve Outcome: Progressing Goal: Emotional status will improve Outcome: Progressing Goal: Mental status will improve Outcome: Progressing Goal: Verbalization of understanding the information provided will improve Outcome: Progressing   Problem: Activity: Goal: Interest or engagement in activities will improve Outcome: Progressing Goal: Sleeping patterns will improve Outcome: Progressing   Problem: Coping: Goal: Ability to verbalize frustrations and anger appropriately will improve Outcome: Progressing Goal: Ability to demonstrate self-control will improve Outcome: Progressing   Problem: Health Behavior/Discharge Planning: Goal: Identification of resources available to assist in meeting health care needs will improve Outcome: Progressing Goal: Compliance with treatment plan for underlying cause of condition will improve Outcome: Progressing   Problem: Physical Regulation: Goal: Ability to maintain clinical measurements within normal limits will improve Outcome: Progressing   Problem: Safety: Goal: Periods of time without injury will increase Outcome: Progressing   Problem: Education: Goal: Ability to verbalize precipitating factors for violent behavior will improve Outcome: Progressing   Problem: Coping: Goal: Ability to verbalize frustrations and anger appropriately will improve Outcome: Progressing   Problem: Health Behavior/Discharge Planning: Goal: Ability to implement measures to prevent violent behavior in the future will improve Outcome: Progressing   Problem: Safety: Goal: Ability to demonstrate self-control will improve Outcome: Progressing Goal: Ability to redirect hostility and anger into socially appropriate behaviors will improve Outcome: Progressing

## 2020-06-07 NOTE — Plan of Care (Signed)
D: Assumed care of patient @ 1900. Patient denies SI/HI/AVH. Patient presents with pleasant mood and affect during assessment. Patient has no physical complaints at this time.  A: Patient was provided with verbal education on provided medications. Patient care plan was reviewed. Patient was offered support and encouragement. Patient was encourage to attend groups, participate in unit activities and continue with plan of care.    R: Patient is receptive to treatment and safety maintained on unit.     Problem: Education: Goal: Knowledge of [facility] Education information/materials will improve Outcome: Not Progressing Goal: Mental status will improve Outcome: Not Progressing Goal: Verbalization of understanding the information provided will improve Outcome: Not Progressing

## 2020-06-08 NOTE — Progress Notes (Addendum)
[facility] MD Progress Note  06/08/2020 2:15 PM Austin Gates  MRN:  [id number]   CC "When can I go home?"  Subjective:  60 year old male with schizoaffective disorder bipolar type. No acute events over the weekend, overnight, or today. Remains at baseline. Continues to deny SI/HI/AH/VH.   Call received from Austin Gates [phone]: Update given once again about recommendations to discharge Austin Gates home. She does not agree with this plan, and wants him to remain in hospital. She was informed of the upcoming rehearing tomorrow at [place], and about her rights to appeal to the judge as his guardian. She expressed understanding and had no further questions.    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 Hypertension   . Intellectual disability   . Obsessive-compulsive disorder   . Schizo-affective schizophrenia [facility])     Past Surgical History:  Procedure Laterality Date  . COLONOSCOPY WITH PROPOFOL N/A 12/28/2017   Procedure: COLONOSCOPY WITH PROPOFOL;  Surgeon: Jonathon Bellows, MD;  Location[facility] ENDOSCOPY;  Service: Gastroenterology;  Laterality: N/A;  . HYDROCELE EXCISION Bilateral 02/22/2018   Procedure: bilateral HYDROCELECTOMY ADULT;  Surgeon: Cleon Gustin, MD;  Location: [place];  Service: Urology;  Laterality: Bilateral;  . INSERTION OF ILIAC STENT Left 02/22/2018   Procedure: INSERTION LEFT SUPERIOR FEMORAL ARTERY USING 6MM X 5CM VIABHON STENT with mynx device closure on right femoral artery;  Surgeon: Waynetta Sandy, MD;  Location: [place];  Service: Vascular;  Laterality:  Left;  . KNEE ARTHROSCOPY    . SCROTAL EXPLORATION N/A 02/22/2018   Procedure: SCROTUM EXPLORATION;  Surgeon: Cleon Gustin, MD;  Location: [place];  Service: Urology;  Laterality: N/A;  . UPPER EXTREMITY ANGIOGRAM Left 02/22/2018   Procedure: left lower EXTREMITY ANGIOGRAM;  Surgeon: Waynetta Sandy, MD;  [facility] OR;  Service: Vascular;  Laterality: Left;   Family History:  Family History  Problem Relation Age of Onset  . Diabetes Mother    Family Psychiatric  History: See H&P Social History:  Social History   Substance and Sexual Activity  Alcohol Use No     Social History   Substance and Sexual Activity  Drug Use No    Social History   Socioeconomic History  . Marital status: Single    Spouse name: Not on file  . Number of children: Not on file  . Years of education: Not on file  . Highest education level: Not on file  Occupational History  . Occupation: Disabled  Tobacco Use  . Smoking status: Former Smoker    Types: Cigarettes  . Smokeless tobacco: Never Used  Vaping Use  . Vaping Use: Unknown  Substance and Sexual Activity  . Alcohol use: No  . Drug use: No  . Sexual activity: Never  Other Topics Concern  . Not on file  Social History Narrative   ** Merged History Encounter **       Social Determinants of Health   Financial Resource Strain: Not on file  Food Insecurity: Not on file  Transportation Needs: Not on file  Physical Activity: Not on file  Stress: Not on file  Social  Connections: Not on file   Additional Social History:                         Sleep: Good  Appetite:  Good  Current Medications: Current Facility-Administered Medications  Medication Dose Route Frequency Provider Last Rate Last Admin  . acetaminophen (TYLENOL) tablet 650 mg  650 mg Oral Q6H PRN [name], John T, MD      . albuterol (VENTOLIN HFA) 108 (90 Base) MCG/ACT inhaler 2 puff  2 puff Inhalation Q4H PRN [name], John T, MD      . alum &  mag hydroxide-simeth (MAALOX/MYLANTA) 200-200-20 MG/5ML suspension 30 mL  30 mL Oral Q4H PRN [name], [name], MD   30 mL at 06/01/20 2014  . benztropine (COGENTIN) tablet 0.5 mg  0.5 mg Oral BID [name], [name], MD   0.5 mg at 06/08/20 5102  . chlorproMAZINE (THORAZINE) injection 50 mg  50 mg Intramuscular TID PRN [name], John T, MD   50 mg at 05/19/20 1256   And  . diphenhydrAMINE (BENADRYL) injection 25 mg  25 mg Intramuscular TID PRN [name], [name], MD   25 mg at 05/19/20 1300  . clopidogrel (PLAVIX) tablet 75 mg  75 mg Oral Daily Salley Scarlet, MD   75 mg at 06/08/20 5852  . cloZAPine (CLOZARIL) tablet 200 mg  200 mg Oral Daily Salley Scarlet, MD   200 mg at 06/08/20 7782  . desmopressin (DDAVP) tablet 0.2 mg  0.2 mg Oral QHS Selina Cooley M, MD   0.2 mg at 06/07/20 2100  . haloperidol lactate (HALDOL) injection 5 mg  5 mg Intramuscular QID PRN Rulon Sera, MD   5 mg at 05/19/20 1440   And  . LORazepam (ATIVAN) injection 2 mg  2 mg Intramuscular Q4H PRN Rulon Sera, MD   2 mg at 05/19/20 1440   And  . diphenhydrAMINE (BENADRYL) injection 50 mg  50 mg Intramuscular Q4H PRN Rulon Sera, MD   50 mg at 05/18/20 1229  . docusate sodium (COLACE) capsule 100 mg  100 mg Oral BID PRN [name], [name], MD   100 mg at 05/17/20 0803  . ferrous sulfate tablet 325 mg  325 mg Oral Q breakfast [name], [name], MD   325 mg at 06/08/20 0807  . food thickener (THICK IT) powder   Oral PRN [name], John T, MD      . haloperidol (HALDOL) tablet 10 mg  10 mg Oral BID [name], [name], MD   10 mg at 06/08/20 4235  . haloperidol lactate (HALDOL) injection 5 mg  5 mg Intramuscular Once Rulon Sera, MD      . hydrocerin (EUCERIN) cream   Topical BID Salley Scarlet, MD   Given at 06/08/20 [phone]  . LORazepam (ATIVAN) tablet 1 mg  1 mg Oral BID Salley Scarlet, MD   1 mg at 06/08/20 4315  . magnesium hydroxide (MILK OF MAGNESIA) suspension 30 mL  30 mL Oral Daily PRN [name], John T, MD      . melatonin  tablet 5 mg  5 mg Oral QHS PRN [name], [name], MD   5 mg at 06/07/20 2100  . midodrine (PROAMATINE) tablet 10 mg  10 mg Oral TID AC [name], [name], MD   10 mg at 06/08/20 1152  . neomycin-bacitracin-polymyxin (NEOSPORIN) ointment packet   Topical BID Salley Scarlet, MD   Given at 06/08/20 [phone]  . nicotine (NICODERM  CQ - dosed in mg/24 hours) patch 21 mg  21 mg Transdermal Daily [name], [name], MD   21 mg at 06/08/20 0809  . OLANZapine zydis (ZYPREXA) disintegrating tablet 10 mg  10 mg Oral QID PRN Rulon Sera, MD   10 mg at 05/25/20 0902  . ondansetron (ZOFRAN) tablet 4 mg  4 mg Oral Q8H PRN Salley Scarlet, MD      . polyethylene glycol (MIRALAX / GLYCOLAX) packet 17 g  17 g Oral Daily PRN [name], John T, MD      . traZODone (DESYREL) tablet 100 mg  100 mg Oral QHS PRN Caroline Sauger, NP   100 mg at 06/04/20 2121  . Valproate Sodium (DEPAKENE) solution 750 mg  750 mg Oral QID Salley Scarlet, MD   750 mg at 06/08/20 1152  . zolpidem (AMBIEN) tablet 5 mg  5 mg Oral QHS Salley Scarlet, MD   5 mg at 06/07/20 2100    Lab Results:  No results found for this or any previous visit (from the past 48 hour(s)).  Blood Alcohol level:  Lab Results  Component Value Date   ETH <10 04/30/2020   ETH <10 27/07/2374    Metabolic Disorder Labs: Lab Results  Component Value Date   HGBA1C 5.4 09/30/2016   MPG 108.28 09/30/2016   MPG 114 09/17/2016   Lab Results  Component Value Date   PROLACTIN 11.8 05/01/2020   Lab Results  Component Value Date   CHOL 135 09/30/2016   TRIG 104 02/25/2018   HDL 60 09/30/2016   CHOLHDL 2.3 09/30/2016   VLDL 12 09/30/2016   LDLCALC 63 09/30/2016    Physical Findings: AIMS: Facial and Oral Movements Muscles of Facial Expression: None, normal Lips and Perioral Area: None, normal Jaw: None, normal Tongue: Moderate,Extremity Movements Upper (arms, wrists, hands, fingers): None, normal Lower (legs, knees, ankles, toes): None, normal, Trunk  Movements Neck, shoulders, hips: None, normal, Overall Severity Severity of abnormal movements (highest score from questions above): None, normal Incapacitation due to abnormal movements: None, normal Patient's awareness of abnormal movements (rate only patient's report): No Awareness, Dental Status Current problems with teeth and/or dentures?: No Does patient usually wear dentures?: No  CIWA:    COWS:     Musculoskeletal: Strength & Muscle Tone: within normal limits Gait & Station: normal Patient leans: N/A  Psychiatric Specialty Exam:  Presentation  General Appearance: Casual  Eye Contact:Fair  Speech:Garbled  Speech Volume:Increased  Handedness:Right   Mood and Affect  Mood:Euthymic  Affect:Constricted   Thought Process  Thought Processes:Goal Directed  Descriptions of Associations:Loose  Orientation:-- (Oriented to person and place)  Thought Content:Perseveration  History of Schizophrenia/Schizoaffective disorder: Yes Duration of Psychotic Symptoms:Greater than 6 months Hallucinations:None Ideas of Reference:None  Suicidal Thoughts:Denies Homicidal Thoughts:Denies  Sensorium  Memory:Immediate Poor; Recent Poor; Remote Poor  Judgment:Impaired  Insight:Lacking   Executive Functions  Concentration:Poor  Attention Span:Poor  Recall:Poor  Fund of Knowledge:Poor  Language:Poor   Psychomotor Activity  Psychomotor Activity:Normal  Assets  Assets:Desire for Improvement; Financial Resources/Insurance; Social Support   Sleep  Sleep:Fair, 8 hours   Physical Exam: Physical Exam  ROS  Blood pressure 115/78, pulse 89, temperature 97.9 F (36.6 C), temperature source Oral, resp. rate 18, height 5\' 11"  (1.803 m), weight 75.8 kg, SpO2 98 %. Body mass index is 23.29 kg/m.   Treatment Plan Summary: Daily contact with patient to assess and evaluate symptoms and progress in treatment and Medication management 60 year old male with  schizoaffective  disorder, bipolar type and intellectual disability. Remains at psychiatric baseline, awaiting placement. Continue medications as above. [place] 2700 on 06/03/20, next CBC check due 06/10/20.  06/08/20: Psychiatric exam above reviewed and remains accurate. Assessment and plan above reviewed and updated.      [place] Scarlet, MD 06/08/2020, 2:15 PM

## 2020-06-08 NOTE — Plan of Care (Signed)
Patient presents at his baseline   Problem: Education: Goal: Emotional status will improve Outcome: Not Progressing Goal: Mental status will improve Outcome: Not Progressing

## 2020-06-08 NOTE — Progress Notes (Signed)
Patient presents at his baseline. Patient calm and compliant with procedures on the unit. Patient denies SI/HI/AVH. Patient stated to this writer that he is ready to go. Pt given education, support, and encouragement to be active in his treatment plan. Patient compliant with medication administration per MD orders. Pt remains safe on the unit.Patient observed by this Probation officer interacting appropriately with staff and peers on the unit.

## 2020-06-08 NOTE — Progress Notes (Signed)
Recreation Therapy Notes   Date: 06/08/2020  Time: 9:30 am   Location: Craft room     Behavioral response: N/A   Intervention Topic: Time-Management   Discussion/Intervention: Patient did not attend group.   Clinical Observations/Feedback:  Patient did not attend group.   Davone Shinault LRT/CTRS         Shelia Magallon 06/08/2020 10:44 AM

## 2020-06-08 NOTE — Progress Notes (Signed)
Patient denies SI, HI, and AVH. Patient is animated on assessment and is pleasant and cooperative with care. Patient denies pain or other physical problems. After breakfast, patient took his medications and returned to his room. Patient is appropriate with staff and peers and is easily redirectable when needed. Support and encouragement provided. Patient remains safe on the unit at this time and q15 min safety checks are maintained.

## 2020-06-08 NOTE — [facility] Group Notes (Signed)
LCSW Group Therapy Note   06/08/2020 1:41 PM  Type of Therapy and Topic:  Group Therapy:  Overcoming Obstacles   Participation Level:  Did Not Attend   Description of Group:    In this group patients will be encouraged to explore what they see as obstacles to their own wellness and recovery. They will be guided to discuss their thoughts, feelings, and behaviors related to these obstacles. The group will process together ways to cope with barriers, with attention given to specific choices patients can make. Each patient will be challenged to identify changes they are motivated to make in order to overcome their obstacles. This group will be process-oriented, with patients participating in exploration of their own experiences as well as giving and receiving support and challenge from other group members.   Therapeutic Goals: 1. Patient will identify personal and current obstacles as they relate to admission. 2. Patient will identify barriers that currently interfere with their wellness or overcoming obstacles.  3. Patient will identify feelings, thought process and behaviors related to these barriers. 4. Patient will identify two changes they are willing to make to overcome these obstacles:      Summary of Patient Progress X    Therapeutic Modalities:   Cognitive Behavioral Therapy Solution Focused Therapy Motivational Interviewing Relapse Prevention Therapy  Hedy Camara R. Guerry Bruin, MSW, [place], [place] 06/08/2020 1:41 PM

## 2020-06-09 NOTE — Plan of Care (Signed)
Pt denies depression, anxiety SI, HI and AVH. Pt was educated on care plan and verbalizes understanding. [name] RN Problem: Education: Goal: Knowledge of Austin Gates Education information/materials will improve Outcome: Progressing Goal: Emotional status will improve Outcome: Progressing Goal: Mental status will improve Outcome: Progressing Goal: Verbalization of understanding the information provided will improve Outcome: Progressing   Problem: Activity: Goal: Interest or engagement in activities will improve Outcome: Progressing Goal: Sleeping patterns will improve Outcome: Progressing   Problem: Coping: Goal: Ability to verbalize frustrations and anger appropriately will improve Outcome: Progressing Goal: Ability to demonstrate self-control will improve Outcome: Progressing   Problem: Health Behavior/Discharge Planning: Goal: Identification of resources available to assist in meeting health care needs will improve Outcome: Progressing Goal: Compliance with treatment plan for underlying cause of condition will improve Outcome: Progressing   Problem: Physical Regulation: Goal: Ability to maintain clinical measurements within normal limits will improve Outcome: Progressing   Problem: Safety: Goal: Periods of time without injury will increase Outcome: Progressing   Problem: Education: Goal: Ability to verbalize precipitating factors for violent behavior will improve Outcome: Progressing   Problem: Coping: Goal: Ability to verbalize frustrations and anger appropriately will improve Outcome: Progressing   Problem: Health Behavior/Discharge Planning: Goal: Ability to implement measures to prevent violent behavior in the future will improve Outcome: Progressing   Problem: Safety: Goal: Ability to demonstrate self-control will improve Outcome: Progressing Goal: Ability to redirect hostility and anger into socially appropriate behaviors will  improve Outcome: Progressing

## 2020-06-09 NOTE — Plan of Care (Signed)
Patient presents at his baseline   Problem: Education: Goal: Emotional status will improve Outcome: Not Progressing Goal: Mental status will improve Outcome: Not Progressing

## 2020-06-09 NOTE — Progress Notes (Signed)
Patient presents at his baseline. Patient calm and compliant with procedures on the unit. Patient denies SI/HI/AVH. Patient scheduled to D/C tomorrow. Pt given education, support, and encouragement to be active in his treatment plan. Patient compliant with medication administration per MD orders. Pt remains safe on the unit.Patient observed by this Probation officer interacting appropriately with staff and peers on the unit.

## 2020-06-09 NOTE — [facility] Group Notes (Signed)
LCSW Group Therapy Note     06/09/2020 4:24 PM     Type of Therapy/Topic:  Group Therapy:  Feelings about Diagnosis     Participation Level:  Did Not Attend     Description of Group:   This group will allow patients to explore their thoughts and feelings about diagnoses they have received. Patients will be guided to explore their level of understanding and acceptance of these diagnoses. Facilitator will encourage patients to process their thoughts and feelings about the reactions of others to their diagnosis and will guide patients in identifying ways to discuss their diagnosis with significant others in their lives. This group will be process-oriented, with patients participating in exploration of their own experiences, giving and receiving support, and processing challenge from other group members.        Therapeutic Goals:  1.    Patient will demonstrate understanding of diagnosis as evidenced by identifying two or more symptoms of the disorder  2.    Patient will be able to express two feelings regarding the diagnosis  3.    Patient will demonstrate their ability to communicate their needs through discussion and/or role play     Summary of Patient Progress: X  Therapeutic Modalities:   Cognitive Behavioral Therapy  Brief Therapy  Feelings Identification    Nikolaus [name] [place], MSW, LCSW-A  06/09/2020 4:24 PM

## 2020-06-09 NOTE — Progress Notes (Signed)
[facility] MD Progress Note  06/09/2020 10:18 AM Austin Gates  MRN:  [id number]   CC "Am I leaving today?"  Subjective:  60 year old male with schizoaffective disorder bipolar type. No acute events over the weekend, overnight, or today. Remains at baseline. Continues to deny SI/HI/AH/VH. Fixated on discharge.   Call received from [name] [phone]: She notes she still does not wish for patient to be discharged to her today. Reiterated that this was still my recommendation, but it would ultimately be up to the judge today. She expressed understanding.   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 Hypertension   . Intellectual disability   . Obsessive-compulsive disorder   . Schizo-affective schizophrenia [facility])     Past Surgical History:  Procedure Laterality Date  . COLONOSCOPY WITH PROPOFOL N/A 12/28/2017   Procedure: COLONOSCOPY WITH PROPOFOL;  Surgeon: Jonathon Bellows, MD;  Location: [facility] ENDOSCOPY;  Service: Gastroenterology;  Laterality: N/A;  . HYDROCELE EXCISION Bilateral 02/22/2018   Procedure: bilateral HYDROCELECTOMY ADULT;  Surgeon: Cleon Gustin, MD;  Location: [place];  Service: Urology;  Laterality: Bilateral;  . INSERTION OF ILIAC STENT Left 02/22/2018   Procedure: INSERTION LEFT SUPERIOR FEMORAL ARTERY USING 6MM X 5CM VIABHON STENT with mynx device closure on right femoral artery;  Surgeon: Waynetta Sandy, MD;  Location: [place];  Service: Vascular;  Laterality: Left;  . KNEE ARTHROSCOPY    . SCROTAL EXPLORATION N/A 02/22/2018   Procedure: SCROTUM EXPLORATION;   Surgeon: Cleon Gustin, MD;  Location: [place];  Service: Urology;  Laterality: N/A;  . UPPER EXTREMITY ANGIOGRAM Left 02/22/2018   Procedure: left lower EXTREMITY ANGIOGRAM;  Surgeon: Waynetta Sandy, MD;  Location: [facility] OR;  Service: Vascular;  Laterality: Left;   Family History:  Family History  Problem Relation Age of Onset  . Diabetes Mother    Family Psychiatric  History: See H&P Social History:  Social History   Substance and Sexual Activity  Alcohol Use No     Social History   Substance and Sexual Activity  Drug Use No    Social History   Socioeconomic History  . Marital status: Single    Spouse name: Not on file  . Number of children: Not on file  . Years of education: Not on file  . Highest education level: Not on file  Occupational History  . Occupation: Disabled  Tobacco Use  . Smoking status: Former Smoker    Types: Cigarettes  . Smokeless tobacco: Never Used  Vaping Use  . Vaping Use: Unknown  Substance and Sexual Activity  . Alcohol use: No  . Drug use: No  . Sexual activity: Never  Other Topics Concern  . Not on file  Social History Narrative   ** Merged History Encounter **       Social Determinants of Health   Financial Resource Strain: Not on file  Food Insecurity: Not on file  Transportation Needs: Not on file  Physical Activity: Not on file  Stress: Not on file  Social Connections: Not on file   Additional Social History:  Sleep: Good  Appetite:  Good  Current Medications: Current Facility-Administered Medications  Medication Dose Route Frequency Provider Last Rate Last Admin  . acetaminophen (TYLENOL) tablet 650 mg  650 mg Oral Q6H PRN [name], John T, MD      . albuterol (VENTOLIN HFA) 108 (90 Base) MCG/ACT inhaler 2 puff  2 puff Inhalation Q4H PRN [name], John T, MD      . alum & mag hydroxide-simeth (MAALOX/MYLANTA) 200-200-20 MG/5ML suspension 30 mL  30 mL Oral Q4H PRN [name],  [name], MD   30 mL at 06/01/20 2014  . benztropine (COGENTIN) tablet 0.5 mg  0.5 mg Oral BID [name], [name], MD   0.5 mg at 06/09/20 0823  . chlorproMAZINE (THORAZINE) injection 50 mg  50 mg Intramuscular TID PRN [name], John T, MD   50 mg at 05/19/20 1256   And  . diphenhydrAMINE (BENADRYL) injection 25 mg  25 mg Intramuscular TID PRN [name], [name], MD   25 mg at 05/19/20 1300  . clopidogrel (PLAVIX) tablet 75 mg  75 mg Oral Daily Salley Scarlet, MD   75 mg at 06/09/20 6301  . cloZAPine (CLOZARIL) tablet 200 mg  200 mg Oral Daily Salley Scarlet, MD   200 mg at 06/09/20 6010  . desmopressin (DDAVP) tablet 0.2 mg  0.2 mg Oral QHS Selina Cooley M, MD   0.2 mg at 06/08/20 2116  . haloperidol lactate (HALDOL) injection 5 mg  5 mg Intramuscular QID PRN Rulon Sera, MD   5 mg at 05/19/20 1440   And  . LORazepam (ATIVAN) injection 2 mg  2 mg Intramuscular Q4H PRN Rulon Sera, MD   2 mg at 05/19/20 1440   And  . diphenhydrAMINE (BENADRYL) injection 50 mg  50 mg Intramuscular Q4H PRN Rulon Sera, MD   50 mg at 05/18/20 1229  . docusate sodium (COLACE) capsule 100 mg  100 mg Oral BID PRN [name], [name], MD   100 mg at 05/17/20 0803  . ferrous sulfate tablet 325 mg  325 mg Oral Q breakfast [name], [name], MD   325 mg at 06/09/20 0823  . food thickener (THICK IT) powder   Oral PRN [name], John T, MD      . haloperidol (HALDOL) tablet 10 mg  10 mg Oral BID [name], [name], MD   10 mg at 06/09/20 9323  . haloperidol lactate (HALDOL) injection 5 mg  5 mg Intramuscular Once Rulon Sera, MD      . hydrocerin (EUCERIN) cream   Topical BID Salley Scarlet, MD   Given at 06/09/20 [phone]  . LORazepam (ATIVAN) tablet 1 mg  1 mg Oral BID Salley Scarlet, MD   1 mg at 06/09/20 2202  . magnesium hydroxide (MILK OF MAGNESIA) suspension 30 mL  30 mL Oral Daily PRN [name], John T, MD      . melatonin tablet 5 mg  5 mg Oral QHS PRN [name], [name], MD   5 mg at 06/08/20 2116  . midodrine (PROAMATINE)  tablet 10 mg  10 mg Oral TID AC [name], [name], MD   10 mg at 06/09/20 5427  . neomycin-bacitracin-polymyxin (NEOSPORIN) ointment packet   Topical BID Salley Scarlet, MD   Given at 06/08/20 [phone]  . nicotine (NICODERM CQ - dosed in mg/24 hours) patch 21 mg  21 mg Transdermal Daily [name], [name], MD   21 mg at 06/09/20 0823  . OLANZapine zydis (ZYPREXA) disintegrating tablet 10  mg  10 mg Oral QID PRN Rulon Sera, MD   10 mg at 05/25/20 0902  . ondansetron (ZOFRAN) tablet 4 mg  4 mg Oral Q8H PRN Salley Scarlet, MD      . polyethylene glycol (MIRALAX / GLYCOLAX) packet 17 g  17 g Oral Daily PRN [name], John T, MD      . traZODone (DESYREL) tablet 100 mg  100 mg Oral QHS PRN Caroline Sauger, NP   100 mg at 06/04/20 2121  . Valproate Sodium (DEPAKENE) solution 750 mg  750 mg Oral QID Salley Scarlet, MD   750 mg at 06/09/20 5170  . zolpidem (AMBIEN) tablet 5 mg  5 mg Oral QHS Salley Scarlet, MD   5 mg at 06/08/20 2116    Lab Results:  No results found for this or any previous visit (from the past 48 hour(s)).  Blood Alcohol level:  Lab Results  Component Value Date   ETH <10 04/30/2020   ETH <10 01/74/9449    Metabolic Disorder Labs: Lab Results  Component Value Date   HGBA1C 5.4 09/30/2016   MPG 108.28 09/30/2016   MPG 114 09/17/2016   Lab Results  Component Value Date   PROLACTIN 11.8 05/01/2020   Lab Results  Component Value Date   CHOL 135 09/30/2016   TRIG 104 02/25/2018   HDL 60 09/30/2016   CHOLHDL 2.3 09/30/2016   VLDL 12 09/30/2016   LDLCALC 63 09/30/2016    Physical Findings: AIMS: Facial and Oral Movements Muscles of Facial Expression: None, normal Lips and Perioral Area: None, normal Jaw: None, normal Tongue: Moderate,Extremity Movements Upper (arms, wrists, hands, fingers): None, normal Lower (legs, knees, ankles, toes): None, normal, Trunk Movements Neck, shoulders, hips: None, normal, Overall Severity Severity of abnormal movements (highest  score from questions above): None, normal Incapacitation due to abnormal movements: None, normal Patient's awareness of abnormal movements (rate only patient's report): No Awareness, Dental Status Current problems with teeth and/or dentures?: No Does patient usually wear dentures?: No  CIWA:    COWS:     Musculoskeletal: Strength & Muscle Tone: within normal limits Gait & Station: normal Patient leans: N/A  Psychiatric Specialty Exam:  Presentation  General Appearance: Casual  Eye Contact:Fair  Speech:Garbled  Speech Volume:Increased  Handedness:Right   Mood and Affect  Mood:Euthymic  Affect:Constricted   Thought Process  Thought Processes:Goal Directed  Descriptions of Associations:Loose  Orientation:-- (Oriented to person and place)  Thought Content:Perseveration  History of Schizophrenia/Schizoaffective disorder: Yes Duration of Psychotic Symptoms:Greater than 6 months Hallucinations:None Ideas of Reference:None  Suicidal Thoughts:Denies Homicidal Thoughts:Denies  Sensorium  Memory:Immediate Poor; Recent Poor; Remote Poor  Judgment:Impaired  Insight:Lacking   Executive Functions  Concentration:Poor  Attention Span:Poor  Recall:Poor  Fund of Knowledge:Poor  Language:Poor   Psychomotor Activity  Psychomotor Activity:Normal  Assets  Assets:Desire for Improvement; Financial Resources/Insurance; Social Support   Sleep  Sleep:Fair, 8 hours   Physical Exam: Physical Exam  ROS  Blood pressure 115/78, pulse 89, temperature 97.9 F (36.6 C), temperature source Oral, resp. rate 18, height 5\' 11"  (1.803 m), weight 75.8 kg, SpO2 98 %. Body mass index is 23.29 kg/m.   Treatment Plan Summary: Daily contact with patient to assess and evaluate symptoms and progress in treatment and Medication management 60 year old male with schizoaffective disorder, bipolar type and intellectual disability. Remains at psychiatric baseline, awaiting  placement. Continue medications as above. [place] 2700 on 06/03/20, next CBC check due 06/10/20.  06/09/20: Psychiatric exam above reviewed and remains  accurate. Assessment and plan above reviewed and updated.       Salley Scarlet, MD 06/09/2020, 10:18 AM

## 2020-06-10 LAB — CBC WITH DIFFERENTIAL/PLATELET
Abs Immature Granulocytes: 0.15 10*3/uL — ABNORMAL HIGH (ref 0.00–0.07)
Basophils Absolute: 0 10*3/uL (ref 0.0–0.1)
Basophils Relative: 0 %
Eosinophils Absolute: 0.1 10*3/uL (ref 0.0–0.5)
Eosinophils Relative: 2 %
HCT: 38.6 % — ABNORMAL LOW (ref 39.0–52.0)
Hemoglobin: 12.3 g/dL — ABNORMAL LOW (ref 13.0–17.0)
Immature Granulocytes: 3 %
Lymphocytes Relative: 20 %
Lymphs Abs: 1 10*3/uL (ref 0.7–4.0)
MCH: 29.2 pg (ref 26.0–34.0)
MCHC: 31.9 g/dL (ref 30.0–36.0)
MCV: 91.7 fL (ref 80.0–100.0)
Monocytes Absolute: 0.5 10*3/uL (ref 0.1–1.0)
Monocytes Relative: 11 %
Neutro Abs: 3.3 10*3/uL (ref 1.7–7.7)
Neutrophils Relative %: 64 %
Platelets: 142 10*3/uL — ABNORMAL LOW (ref 150–400)
RBC: 4.21 MIL/uL — ABNORMAL LOW (ref 4.22–5.81)
RDW: 15.6 % — ABNORMAL HIGH (ref 11.5–15.5)
WBC: 5 10*3/uL (ref 4.0–10.5)
nRBC: 0.4 % — ABNORMAL HIGH (ref 0.0–0.2)

## 2020-06-10 NOTE — Plan of Care (Signed)
  Problem: Disruptive/Impulsive Goal: STG - Patient will focus on task/topic with 2 prompts from staff within 5 recreation therapy group sessions Description: STG - Patient will focus on task/topic with 2 prompts from staff within 5 recreation therapy group sessions Outcome: Progressing

## 2020-06-10 NOTE — Progress Notes (Signed)
[place] APS report completed at 4:44PM

## 2020-06-10 NOTE — Plan of Care (Signed)
Pt denies depression, anxiety, SI, HI and AVH. Pt was educated on care plan and verbalizes understanding. [name] RN   Problem: Education: Goal: Knowledge of [facility] Education information/materials will improve Outcome: Progressing Goal: Emotional status will improve Outcome: Progressing Goal: Mental status will improve Outcome: Progressing Goal: Verbalization of understanding the information provided will improve Outcome: Progressing   Problem: Activity: Goal: Interest or engagement in activities will improve Outcome: Progressing Goal: Sleeping patterns will improve Outcome: Progressing   Problem: Coping: Goal: Ability to verbalize frustrations and anger appropriately will improve Outcome: Progressing Goal: Ability to demonstrate self-control will improve Outcome: Progressing   Problem: Health Behavior/Discharge Planning: Goal: Identification of resources available to assist in meeting health care needs will improve Outcome: Progressing Goal: Compliance with treatment plan for underlying cause of condition will improve Outcome: Progressing   Problem: Physical Regulation: Goal: Ability to maintain clinical measurements within normal limits will improve Outcome: Progressing   Problem: Safety: Goal: Periods of time without injury will increase Outcome: Progressing   Problem: Education: Goal: Ability to verbalize precipitating factors for violent behavior will improve Outcome: Progressing   Problem: Coping: Goal: Ability to verbalize frustrations and anger appropriately will improve Outcome: Progressing   Problem: Health Behavior/Discharge Planning: Goal: Ability to implement measures to prevent violent behavior in the future will improve Outcome: Progressing

## 2020-06-10 NOTE — Plan of Care (Signed)
Patient remains cooperative and denying thoughts of self-harm. Denying hallucinations. No sign of distress. Patient programming well in unit activities.

## 2020-06-10 NOTE — Progress Notes (Signed)
Recreation Therapy Notes   Date: 06/10/2020  Time: 9:45 am   Location: Craft room     Behavioral response: N/A   Intervention Topic: Stress   Discussion/Intervention: Patient did not attend group.   Clinical Observations/Feedback:  Patient did not attend group.   Syniah Berne LRT/CTRS        Austin Gates 06/10/2020 11:31 AM

## 2020-06-10 NOTE — Progress Notes (Signed)
Pt has been calm, cooperative, med compliant and pleasant today. Pt anticipates discharge. [name] RN

## 2020-06-10 NOTE — Progress Notes (Signed)
[name] Progress Note  06/10/2020 2:09 PM Austin Gates  MRN:  [id number]   CC "I want to go to my sister's house"  Subjective:  60 year old male with schizoaffective disorder bipolar type. No acute events overnight. He continues to deny SI/HI/AH/VH. He would like to return to his sister's house today. Obtained court order for discharge yesterday.    Contacted [name] [phone]: She notes she still does not wish for patient to be discharged to her. She has refused to come pick patient up. She states she will be speaking to her lawyer.   [place] legal services contacted today for assistance on how to proceed.    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 Hypertension   . Intellectual disability   . Obsessive-compulsive disorder   . Schizo-affective schizophrenia [facility])     Past Surgical History:  Procedure Laterality Date  . COLONOSCOPY WITH PROPOFOL N/A 12/28/2017   Procedure: COLONOSCOPY WITH PROPOFOL;  Surgeon: Jonathon Bellows, MD;  Location: [facility] ENDOSCOPY;  Service: Gastroenterology;  Laterality: N/A;  . HYDROCELE EXCISION Bilateral 02/22/2018   Procedure: bilateral HYDROCELECTOMY ADULT;  Surgeon: Cleon Gustin, MD;  Location: [place];  Service: Urology;  Laterality: Bilateral;  . INSERTION OF ILIAC STENT Left 02/22/2018   Procedure: INSERTION LEFT SUPERIOR FEMORAL ARTERY USING 6MM X 5CM VIABHON STENT with mynx device closure on right femoral artery;  Surgeon: Waynetta Sandy, MD;  Location: [place];  Service: Vascular;  Laterality: Left;  . KNEE ARTHROSCOPY     . SCROTAL EXPLORATION N/A 02/22/2018   Procedure: SCROTUM EXPLORATION;  Surgeon: Cleon Gustin, MD;  Location: [place];  Service: Urology;  Laterality: N/A;  . UPPER EXTREMITY ANGIOGRAM Left 02/22/2018   Procedure: left lower EXTREMITY ANGIOGRAM;  Surgeon: Waynetta Sandy, MD;  Location: [facility] OR;  Service: Vascular;  Laterality: Left;   Family History:  Family History  Problem Relation Age of Onset  . Diabetes Mother    Family Psychiatric  History: See H&P Social History:  Social History   Substance and Sexual Activity  Alcohol Use No     Social History   Substance and Sexual Activity  Drug Use No    Social History   Socioeconomic History  . Marital status: Single    Spouse name: Not on file  . Number of children: Not on file  . Years of education: Not on file  . Highest education level: Not on file  Occupational History  . Occupation: Disabled  Tobacco Use  . Smoking status: Former Smoker    Types: Cigarettes  . Smokeless tobacco: Never Used  Vaping Use  . Vaping Use: Unknown  Substance and Sexual Activity  . Alcohol use: No  . Drug use: No  . Sexual activity: Never  Other Topics Concern  . Not on file  Social History Narrative   ** Merged History Encounter **       Social Determinants of Health   Financial Resource Strain: Not on file  Food Insecurity: Not on file  Transportation Needs: Not on file  Physical Activity: Not on file  Stress: Not on file  Social Connections: Not on file   Additional Social History:                         Sleep: Good  Appetite:  Good  Current Medications: Current Facility-Administered Medications  Medication Dose Route Frequency Provider Last Rate Last Admin  . acetaminophen (TYLENOL) tablet 650 mg  650 mg Oral Q6H PRN [name], John T, MD      . albuterol (VENTOLIN HFA) 108 (90 Base) MCG/ACT inhaler 2 puff  2 puff Inhalation Q4H PRN [name], John T, MD      . alum & mag hydroxide-simeth  (MAALOX/MYLANTA) 200-200-20 MG/5ML suspension 30 mL  30 mL Oral Q4H PRN [name], [name], MD   30 mL at 06/01/20 2014  . benztropine (COGENTIN) tablet 0.5 mg  0.5 mg Oral BID [name], [name], MD   0.5 mg at 06/10/20 0804  . chlorproMAZINE (THORAZINE) injection 50 mg  50 mg Intramuscular TID PRN [name], John T, MD   50 mg at 05/19/20 1256   And  . diphenhydrAMINE (BENADRYL) injection 25 mg  25 mg Intramuscular TID PRN [name], [name], MD   25 mg at 05/19/20 1300  . clopidogrel (PLAVIX) tablet 75 mg  75 mg Oral Daily Salley Scarlet, MD   75 mg at 06/10/20 0805  . cloZAPine (CLOZARIL) tablet 200 mg  200 mg Oral Daily Salley Scarlet, MD   200 mg at 06/10/20 1660  . desmopressin (DDAVP) tablet 0.2 mg  0.2 mg Oral QHS Selina Cooley M, MD   0.2 mg at 06/09/20 2113  . haloperidol lactate (HALDOL) injection 5 mg  5 mg Intramuscular QID PRN Rulon Sera, MD   5 mg at 05/19/20 1440   And  . LORazepam (ATIVAN) injection 2 mg  2 mg Intramuscular Q4H PRN Rulon Sera, MD   2 mg at 05/19/20 1440   And  . diphenhydrAMINE (BENADRYL) injection 50 mg  50 mg Intramuscular Q4H PRN Rulon Sera, MD   50 mg at 05/18/20 1229  . docusate sodium (COLACE) capsule 100 mg  100 mg Oral BID PRN [name], [name], MD   100 mg at 05/17/20 0803  . ferrous sulfate tablet 325 mg  325 mg Oral Q breakfast [name], [name], MD   325 mg at 06/10/20 0805  . food thickener (THICK IT) powder   Oral PRN [name], John T, MD      . haloperidol (HALDOL) tablet 10 mg  10 mg Oral BID [name], [name], MD   10 mg at 06/10/20 0805  . hydrocerin (EUCERIN) cream   Topical BID Salley Scarlet, MD   Given at 06/09/20 [phone]  . LORazepam (ATIVAN) tablet 1 mg  1 mg Oral BID Salley Scarlet, MD   1 mg at 06/10/20 0805  . magnesium hydroxide (MILK OF MAGNESIA) suspension 30 mL  30 mL Oral Daily PRN [name], John T, MD      . melatonin tablet 5 mg  5 mg Oral QHS PRN [name], [name], MD   5 mg at 06/08/20 2116  . midodrine (PROAMATINE) tablet 10 mg  10  mg Oral TID AC [name], [name], MD   10 mg at 06/10/20 0805  . nicotine (NICODERM CQ - dosed in mg/24 hours) patch 21 mg  21 mg Transdermal Daily [name], [name], MD   21 mg at 06/10/20 0810  . OLANZapine zydis (ZYPREXA) disintegrating tablet 10 mg  10 mg Oral QID  PRN Rulon Sera, MD   10 mg at 05/25/20 0902  . ondansetron (ZOFRAN) tablet 4 mg  4 mg Oral Q8H PRN Salley Scarlet, MD      . polyethylene glycol (MIRALAX / GLYCOLAX) packet 17 g  17 g Oral Daily PRN [name], John T, MD      . traZODone (DESYREL) tablet 100 mg  100 mg Oral QHS PRN Caroline Sauger, NP   100 mg at 06/09/20 2112  . Valproate Sodium (DEPAKENE) solution 750 mg  750 mg Oral QID Salley Scarlet, MD   750 mg at 06/10/20 1209  . zolpidem (AMBIEN) tablet 5 mg  5 mg Oral QHS Salley Scarlet, MD   5 mg at 06/09/20 2112    Lab Results:  Results for orders placed or performed during the hospital encounter of 05/07/20 (from the past 48 hour(s))  CBC with Differential/Platelet     Status: Abnormal   Collection Time: 06/10/20  7:53 AM  Result Value Ref Range   WBC 5.0 4.0 - 10.5 K/uL   RBC 4.21 (L) 4.22 - 5.81 MIL/uL   Hemoglobin 12.3 (L) 13.0 - 17.0 g/dL   HCT 38.6 (L) 39.0 - 52.0 %   MCV 91.7 80.0 - 100.0 fL   MCH 29.2 26.0 - 34.0 pg   MCHC 31.9 30.0 - 36.0 g/dL   RDW 15.6 (H) 11.5 - 15.5 %   Platelets 142 (L) 150 - 400 K/uL   nRBC 0.4 (H) 0.0 - 0.2 %   Neutrophils Relative % 64 %   Neutro Abs 3.3 1.7 - 7.7 K/uL   Lymphocytes Relative 20 %   Lymphs Abs 1.0 0.7 - 4.0 K/uL   Monocytes Relative 11 %   Monocytes Absolute 0.5 0.1 - 1.0 K/uL   Eosinophils Relative 2 %   Eosinophils Absolute 0.1 0.0 - 0.5 K/uL   Basophils Relative 0 %   Basophils Absolute 0.0 0.0 - 0.1 K/uL   Immature Granulocytes 3 %   Abs Immature Granulocytes 0.15 (H) 0.00 - 0.07 K/uL    Comment: Performed at [facility], [address] [place], [place] [address]    Blood Alcohol level:  Lab Results  Component Value Date   [facility]  <10 04/30/2020   ETH <10 02/77/4128    Metabolic Disorder Labs: Lab Results  Component Value Date   HGBA1C 5.4 09/30/2016   MPG 108.28 09/30/2016   MPG 114 09/17/2016   Lab Results  Component Value Date   PROLACTIN 11.8 05/01/2020   Lab Results  Component Value Date   CHOL 135 09/30/2016   TRIG 104 02/25/2018   HDL 60 09/30/2016   CHOLHDL 2.3 09/30/2016   VLDL 12 09/30/2016   LDLCALC 63 09/30/2016    Physical Findings: AIMS: Facial and Oral Movements Muscles of Facial Expression: None, normal Lips and Perioral Area: None, normal Jaw: None, normal Tongue: Moderate,Extremity Movements Upper (arms, wrists, hands, fingers): None, normal Lower (legs, knees, ankles, toes): None, normal, Trunk Movements Neck, shoulders, hips: None, normal, Overall Severity Severity of abnormal movements (highest score from questions above): None, normal Incapacitation due to abnormal movements: None, normal Patient's awareness of abnormal movements (rate only patient's report): No Awareness, Dental Status Current problems with teeth and/or dentures?: No Does patient usually wear dentures?: No  CIWA:    COWS:     Musculoskeletal: Strength & Muscle Tone: within normal limits Gait & Station: normal Patient leans: N/A  Psychiatric Specialty Exam:  Presentation  General Appearance: Casual  Eye Contact:Fair  Speech:Garbled  Speech Volume:Increased  Handedness:Right   Mood and Affect  Mood:Euthymic  Affect:Constricted   Thought Process  Thought Processes:Goal Directed  Descriptions of Associations:Loose  Orientation:-- (Oriented to person and place)  Thought Content:Perseveration  History of Schizophrenia/Schizoaffective disorder: Yes Duration of Psychotic Symptoms:Greater than 6 months Hallucinations:None Ideas of Reference:None  Suicidal Thoughts:Denies Homicidal Thoughts:Denies  Sensorium  Memory:Immediate Poor; Recent Poor; Remote  Poor  Judgment:Impaired  Insight:Lacking   Executive Functions  Concentration:Poor  Attention Span:Poor  Recall:Poor  Fund of Knowledge:Poor  Language:Poor   Psychomotor Activity  Psychomotor Activity:Normal  Assets  Assets:Desire for Improvement; Financial Resources/Insurance; Social Support   Sleep  Sleep:Fair, 8 hours   Physical Exam: Physical Exam  ROS  Blood pressure 123/87, pulse (!) 112, temperature 97.9 F (36.6 C), temperature source Oral, resp. rate 17, height 5\' 11"  (1.803 m), weight 75.8 kg, SpO2 97 %. Body mass index is 23.29 kg/m.   Treatment Plan Summary: Daily contact with patient to assess and evaluate symptoms and progress in treatment and Medication management 60 year old male with schizoaffective disorder, bipolar type and intellectual disability. Remains at psychiatric baseline. Continue medications as above. [place] [address]50 today. Court order in place for discharge, but guardian refusing to pick up. Coordinating with legal department on how to proceed.   06/10/20: Psychiatric exam above reviewed and remains accurate. Assessment and plan above reviewed and updated.   Salley Scarlet, MD 06/10/2020, 2:09 PM

## 2020-06-10 NOTE — Progress Notes (Signed)
Pharmacy - Clozapine     This patient's order has been reviewed for prescribing contraindications.   Labs: [facility] 4/6 2400 4/13 2700 4/20 3300  The medication is being dispensed pursuant to the FDA REMS suspension order of 01/10/20 that allows for dispensing without a patient REMS dispense authorization (RDA).   Next CBC/diff 4/27.   Darnelle Bos, PharmD

## 2020-06-10 NOTE — BHH Group Notes (Signed)
LCSW Group Therapy Note  06/10/2020 3:05 PM  Type of Therapy/Topic:  Group Therapy:  Emotion Regulation  Participation Level:  Did Not Attend   Description of Group:   The purpose of this group is to assist patients in learning to regulate negative emotions and experience positive emotions. Patients will be guided to discuss ways in which they have been vulnerable to their negative emotions. These vulnerabilities will be juxtaposed with experiences of positive emotions or situations, and patients will be challenged to use positive emotions to combat negative ones. Special emphasis will be placed on coping with negative emotions in conflict situations, and patients will process healthy conflict resolution skills.  Therapeutic Goals: 1. Patient will identify two positive emotions or experiences to reflect on in order to balance out negative emotions 2. Patient will label two or more emotions that they find the most difficult to experience 3. Patient will demonstrate positive conflict resolution skills through discussion and/or role plays  Summary of Patient Progress: X  Therapeutic Modalities:   Cognitive Behavioral Therapy Feelings Identification Dialectical Behavioral Therapy  Austin Gates R. Guerry Bruin, MSW, [place], [place] 06/10/2020 3:05 PM

## 2020-06-11 NOTE — Progress Notes (Signed)
Patient presents at his baseline. Patient calm and compliant with procedures on the unit. Patient denies SI/HI/AVH. Pt given education, support, and encouragement to be active in his treatment plan. Patient compliant with medication administration per MD orders. Pt remains safe on the unit.Patient observed by this Probation officer interacting appropriately with staff and peers on the unit.

## 2020-06-11 NOTE — Plan of Care (Signed)
Patient presents at his baseline  Problem: Education: Goal: Emotional status will improve Outcome: Not Progressing Goal: Mental status will improve Outcome: Not Progressing

## 2020-06-11 NOTE — BHH Counselor (Signed)
CSW took call from [facility] [phone]) regarding disposition at 9:49AM. Leward Quan states that he has a place for patient on Monday, 06/15/20 that will come pick him up. He spoke at length that he preferred that pt stay in the hospital until he could go to this place. CSW reminded him that this was ultimately up to the provider as pt has been psych cleared. Leward Quan also requested a copy of the FL2. CSW agreed to send this over to Care Coordinator at [phone]. CSW stated that he would follow up with Leward Quan regarding Monday. No other concerns expressed. Contact ended without incident.   CSW called Leward Quan [phone]) to update that provider was ok with Monday and to inquire regarding any needed information for admission to group home (COVID testing and PPD). No answer. HIPPA compliant voicemail left with contact information for follow up.   CSW faxed FL2 to [place] at [phone]. Received confirmation that fax went through.   Chalmers Guest. Guerry Bruin, MSW, LCSW, [place] 06/11/2020 11:04 AM

## 2020-06-11 NOTE — Plan of Care (Signed)
Pt denies depression, anxiety, SI, HI and AVH. Pt was educated on care plan and verbalizes understanding. Pt is calm, cooperative and med compliant. [name] RN Problem: Education: Goal: Knowledge of [facility] Education information/materials will improve Outcome: Progressing Goal: Emotional status will improve Outcome: Progressing Goal: Mental status will improve Outcome: Progressing Goal: Verbalization of understanding the information provided will improve Outcome: Progressing   Problem: Activity: Goal: Interest or engagement in activities will improve Outcome: Progressing Goal: Sleeping patterns will improve Outcome: Progressing   Problem: Coping: Goal: Ability to verbalize frustrations and anger appropriately will improve Outcome: Progressing Goal: Ability to demonstrate self-control will improve Outcome: Progressing   Problem: Health Behavior/Discharge Planning: Goal: Identification of resources available to assist in meeting health care needs will improve Outcome: Progressing Goal: Compliance with treatment plan for underlying cause of condition will improve Outcome: Progressing   Problem: Physical Regulation: Goal: Ability to maintain clinical measurements within normal limits will improve Outcome: Progressing   Problem: Safety: Goal: Periods of time without injury will increase Outcome: Progressing   Problem: Education: Goal: Ability to verbalize precipitating factors for violent behavior will improve Outcome: Progressing   Problem: Coping: Goal: Ability to verbalize frustrations and anger appropriately will improve Outcome: Progressing   Problem: Health Behavior/Discharge Planning: Goal: Ability to implement measures to prevent violent behavior in the future will improve Outcome: Progressing   Problem: Safety: Goal: Ability to demonstrate self-control will improve Outcome: Progressing Goal: Ability to redirect hostility and anger into socially  appropriate behaviors will improve Outcome: Progressing

## 2020-06-11 NOTE — Progress Notes (Signed)
Recreation Therapy Notes  Date: 06/11/2020  Time: 10:00 am   Location: Craft room     Behavioral response: N/A   Intervention Topic: Emotions    Discussion/Intervention: Patient did not attend group.   Clinical Observations/Feedback:  Patient did not attend group.   Marijane Trower LRT/CTRS        Keyler Hoge 06/11/2020 11:41 AM
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
## 2020-06-11 NOTE — Progress Notes (Signed)
Pt has been calm, cooperative and med compliant. Pt has been social and pleasant with an animated expression. Pt has no complaints. [name] RN

## 2020-06-11 NOTE — Progress Notes (Signed)
[facility] MD Progress Note  06/11/2020 10:34 AM Austin Gates  MRN:  [id number]   CC "Can I leave today?"  Subjective:  60 year old male with schizoaffective disorder, bipolar type. No acute events overnight. He continues to deny SI/HI/AH/VH. Remains psychiatrically stable and ready for discharge. CSW working with Zipporah Plants on placement.   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 Hypertension   . Intellectual disability   . Obsessive-compulsive disorder   . Schizo-affective schizophrenia [facility])     Past Surgical History:  Procedure Laterality Date  . COLONOSCOPY WITH PROPOFOL N/A 12/28/2017   Procedure: COLONOSCOPY WITH PROPOFOL;  Surgeon: Jonathon Bellows, MD;  Location: [facility] ENDOSCOPY;  Service: Gastroenterology;  Laterality: N/A;  . HYDROCELE EXCISION Bilateral 02/22/2018   Procedure: bilateral HYDROCELECTOMY ADULT;  Surgeon: Cleon Gustin, MD;  Location: [facility];  Service: Urology;  Laterality: Bilateral;  . INSERTION OF ILIAC STENT Left 02/22/2018   Procedure: INSERTION LEFT SUPERIOR FEMORAL ARTERY USING 6MM X 5CM VIABHON STENT with mynx device closure on right femoral artery;  Surgeon: Waynetta Sandy, MD;  Location: [place];  Service: Vascular;  Laterality: Left;  . KNEE ARTHROSCOPY    . SCROTAL EXPLORATION N/A 02/22/2018   Procedure: SCROTUM EXPLORATION;  Surgeon: Cleon Gustin, MD;  Location: [place];  Service: Urology;  Laterality: N/A;  . UPPER EXTREMITY ANGIOGRAM Left 02/22/2018   Procedure: left lower EXTREMITY ANGIOGRAM;  Surgeon: Waynetta Sandy, MD;   Location: [facility] OR;  Service: Vascular;  Laterality: Left;   Family History:  Family History  Problem Relation Age of Onset  . Diabetes Mother    Family Psychiatric  History: See H&P Social History:  Social History   Substance and Sexual Activity  Alcohol Use No     Social History   Substance and Sexual Activity  Drug Use No    Social History   Socioeconomic History  . Marital status: Single    Spouse name: Not on file  . Number of children: Not on file  . Years of education: Not on file  . Highest education level: Not on file  Occupational History  . Occupation: Disabled  Tobacco Use  . Smoking status: Former Smoker    Types: Cigarettes  . Smokeless tobacco: Never Used  Vaping Use  . Vaping Use: Unknown  Substance and Sexual Activity  . Alcohol use: No  . Drug use: No  . Sexual activity: Never  Other Topics Concern  . Not on file  Social History Narrative   ** Merged History Encounter **       Social Determinants of Health   Financial Resource Strain: Not on file  Food Insecurity: Not on file  Transportation Needs: Not on file  Physical Activity: Not on file  Stress: Not on file  Social Connections: Not on file   Additional Social History:   Sleep: Good  Appetite:  Good  Current Medications: Current Facility-Administered Medications  Medication Dose Route Frequency Provider Last Rate Last Admin  . acetaminophen (TYLENOL) tablet 650 mg  650 mg Oral Q6H PRN [name], [name], MD      . albuterol (  VENTOLIN HFA) 108 (90 Base) MCG/ACT inhaler 2 puff  2 puff Inhalation Q4H PRN [name], John T, MD      . alum & mag hydroxide-simeth (MAALOX/MYLANTA) 200-200-20 MG/5ML suspension 30 mL  30 mL Oral Q4H PRN [name], [name], MD   30 mL at 06/01/20 2014  . benztropine (COGENTIN) tablet 0.5 mg  0.5 mg Oral BID [name], John T, MD   0.5 mg at 06/11/20 0900  . clopidogrel (PLAVIX) tablet 75 mg  75 mg Oral Daily Salley Scarlet, MD   75 mg at 06/11/20 0901  . cloZAPine  (CLOZARIL) tablet 200 mg  200 mg Oral Daily Salley Scarlet, MD   200 mg at 06/11/20 0901  . desmopressin (DDAVP) tablet 0.2 mg  0.2 mg Oral QHS Salley Scarlet, MD   0.2 mg at 06/10/20 2104  . docusate sodium (COLACE) capsule 100 mg  100 mg Oral BID PRN [name], [name], MD   100 mg at 05/17/20 0803  . ferrous sulfate tablet 325 mg  325 mg Oral Q breakfast [name], [name], MD   325 mg at 06/11/20 0901  . food thickener (THICK IT) powder   Oral PRN [name], John T, MD      . haloperidol (HALDOL) tablet 10 mg  10 mg Oral BID [name], John T, MD   10 mg at 06/11/20 0900  . hydrocerin (EUCERIN) cream   Topical BID Salley Scarlet, MD   Given at 06/09/20 [phone]  . LORazepam (ATIVAN) tablet 1 mg  1 mg Oral BID Salley Scarlet, MD   1 mg at 06/11/20 0901  . magnesium hydroxide (MILK OF MAGNESIA) suspension 30 mL  30 mL Oral Daily PRN [name], John T, MD      . melatonin tablet 5 mg  5 mg Oral QHS PRN [name], [name], MD   5 mg at 06/08/20 2116  . midodrine (PROAMATINE) tablet 10 mg  10 mg Oral TID AC [name], [name], MD   10 mg at 06/11/20 0901  . nicotine (NICODERM CQ - dosed in mg/24 hours) patch 21 mg  21 mg Transdermal Daily [name], [name], MD   21 mg at 06/11/20 0903  . ondansetron (ZOFRAN) tablet 4 mg  4 mg Oral Q8H PRN Salley Scarlet, MD      . polyethylene glycol (MIRALAX / GLYCOLAX) packet 17 g  17 g Oral Daily PRN [name], John T, MD      . traZODone (DESYREL) tablet 100 mg  100 mg Oral QHS PRN Caroline Sauger, NP   100 mg at 06/09/20 2112  . Valproate Sodium (DEPAKENE) solution 750 mg  750 mg Oral QID Salley Scarlet, MD   750 mg at 06/11/20 3149  . zolpidem (AMBIEN) tablet 5 mg  5 mg Oral QHS Salley Scarlet, MD   5 mg at 06/10/20 2104    Lab Results:  Results for orders placed or performed during the hospital encounter of 05/07/20 (from the past 48 hour(s))  CBC with Differential/Platelet     Status: Abnormal   Collection Time: 06/10/20  7:53 AM  Result Value Ref Range   WBC  5.0 4.0 - 10.5 K/uL   RBC 4.21 (L) 4.22 - 5.81 MIL/uL   Hemoglobin 12.3 (L) 13.0 - 17.0 g/dL   HCT 38.6 (L) 39.0 - 52.0 %   MCV 91.7 80.0 - 100.0 fL   MCH 29.2 26.0 - 34.0 pg   MCHC 31.9 30.0 - 36.0 g/dL  RDW 15.6 (H) 11.5 - 15.5 %   Platelets 142 (L) 150 - 400 K/uL   nRBC 0.4 (H) 0.0 - 0.2 %   Neutrophils Relative % 64 %   Neutro Abs 3.3 1.7 - 7.7 K/uL   Lymphocytes Relative 20 %   Lymphs Abs 1.0 0.7 - 4.0 K/uL   Monocytes Relative 11 %   Monocytes Absolute 0.5 0.1 - 1.0 K/uL   Eosinophils Relative 2 %   Eosinophils Absolute 0.1 0.0 - 0.5 K/uL   Basophils Relative 0 %   Basophils Absolute 0.0 0.0 - 0.1 K/uL   Immature Granulocytes 3 %   Abs Immature Granulocytes 0.15 (H) 0.00 - 0.07 K/uL    Comment: Performed at [facility], [address] [place], [place] [address]    Blood Alcohol level:  Lab Results  Component Value Date   [facility] <10 04/30/2020   ETH <10 00/93/8182    Metabolic Disorder Labs: Lab Results  Component Value Date   HGBA1C 5.4 09/30/2016   MPG 108.28 09/30/2016   MPG 114 09/17/2016   Lab Results  Component Value Date   PROLACTIN 11.8 05/01/2020   Lab Results  Component Value Date   CHOL 135 09/30/2016   TRIG 104 02/25/2018   HDL 60 09/30/2016   CHOLHDL 2.3 09/30/2016   VLDL 12 09/30/2016   LDLCALC 63 09/30/2016    Physical Findings: AIMS: Facial and Oral Movements Muscles of Facial Expression: None, normal Lips and Perioral Area: None, normal Jaw: None, normal Tongue: Moderate,Extremity Movements Upper (arms, wrists, hands, fingers): None, normal Lower (legs, knees, ankles, toes): None, normal, Trunk Movements Neck, shoulders, hips: None, normal, Overall Severity Severity of abnormal movements (highest score from questions above): None, normal Incapacitation due to abnormal movements: None, normal Patient's awareness of abnormal movements (rate only patient's report): No Awareness, Dental Status Current problems with teeth  and/or dentures?: No Does patient usually wear dentures?: No  CIWA:    COWS:     Musculoskeletal: Strength & Muscle Tone: within normal limits Gait & Station: normal Patient leans: N/A  Psychiatric Specialty Exam:  Presentation  General Appearance: Casual  Eye Contact:Fair  Speech:Garbled  Speech Volume:Increased  Handedness:Right   Mood and Affect  Mood:Euthymic  Affect:Constricted   Thought Process  Thought Processes:Goal Directed  Descriptions of Associations:Loose  Orientation:-- (Oriented to person and place)  Thought Content:Perseveration  History of Schizophrenia/Schizoaffective disorder: Yes Duration of Psychotic Symptoms:Greater than 6 months Hallucinations:None Ideas of Reference:None  Suicidal Thoughts:Denies Homicidal Thoughts:Denies  Sensorium  Memory:Immediate Poor; Recent Poor; Remote Poor  Judgment:Impaired  Insight:Lacking   Executive Functions  Concentration:Poor  Attention Span:Poor  Recall:Poor  Fund of Knowledge:Poor  Language:Poor   Psychomotor Activity  Psychomotor Activity:Normal  Assets  Assets:Desire for Improvement; Financial Resources/Insurance; Social Support   Sleep  Sleep:Good, 8 hours   Physical Exam: Physical Exam  ROS  Blood pressure 107/79, pulse 98, temperature 98.1 F (36.7 C), temperature source Oral, resp. rate 18, height 5\' 11"  (1.803 m), weight 75.8 kg, SpO2 98 %. Body mass index is 23.29 kg/m.   Treatment Plan Summary: Daily contact with patient to assess and evaluate symptoms and progress in treatment and Medication management 60 year old male with schizoaffective disorder, bipolar type and intellectual disability. Remains at psychiatric baseline. Continue medications as above. [place] 3300 on 06/10/20.   06/11/20: Psychiatric exam above reviewed and remains accurate. Assessment and plan above reviewed and updated.   Salley Scarlet, MD 06/11/2020, 10:34 AM

## 2020-06-11 NOTE — BHH Group Notes (Signed)
LCSW Group Therapy Note     06/11/2020 4:06 PM     Type of Therapy/Topic:  Group Therapy:  Balance in Life     Participation Level:  Did Not Attend     Description of Group:    This group will address the concept of balance and how it feels and looks when one is unbalanced. Patients will be encouraged to process areas in their lives that are out of balance and identify reasons for remaining unbalanced. Facilitators will guide patients in utilizing problem-solving interventions to address and correct the stressor making their life unbalanced. Understanding and applying boundaries will be explored and addressed for obtaining and maintaining a balanced life. Patients will be encouraged to explore ways to assertively make their unbalanced needs known to significant others in their lives, using other group members and facilitator for support and feedback.     Therapeutic Goals:  1.      Patient will identify two or more emotions or situations they have that consume much of in their lives.  2.      Patient will identify signs/triggers that life has become out of balance:  3.      Patient will identify two ways to set boundaries in order to achieve balance in their lives:  4.      Patient will demonstrate ability to communicate their needs through discussion and/or role plays     Summary of Patient Progress: X    Therapeutic Modalities:   Cognitive Behavioral Therapy  Solution-Focused Therapy  Assertiveness Training     Johnmichael Melhorn [place] MSW, LCSW-A  06/11/2020 4:06 PM

## 2020-06-12 MED ORDER — MIDODRINE HCL 10 MG PO TABS: 10.0000 mg | ORAL_TABLET | Freq: Three times a day (TID) | ORAL | 1 refills | Status: AC

## 2020-06-12 MED ORDER — TRAZODONE HCL 100 MG PO TABS: 100.0000 mg | ORAL_TABLET | Freq: Every evening | ORAL | 1 refills | Status: AC | PRN

## 2020-06-12 MED ORDER — LACTULOSE 10 GM/15ML PO SOLN: 20.0000 g | Freq: Every day | ORAL | 0 refills | Status: AC

## 2020-06-12 MED ORDER — DESMOPRESSIN ACETATE 0.2 MG PO TABS: 0.2000 mg | ORAL_TABLET | Freq: Every day | ORAL | 1 refills | Status: AC

## 2020-06-12 MED ORDER — BENZTROPINE MESYLATE 0.5 MG PO TABS: 0.5000 mg | ORAL_TABLET | Freq: Two times a day (BID) | ORAL | 1 refills | Status: AC

## 2020-06-12 MED ORDER — CLOZAPINE 200 MG PO TABS: 200.0000 mg | ORAL_TABLET | Freq: Every day | ORAL | 0 refills | Status: AC

## 2020-06-12 MED ORDER — MELATONIN 5 MG PO TABS: 5.0000 mg | ORAL_TABLET | Freq: Every evening | ORAL | 1 refills | Status: AC | PRN

## 2020-06-12 MED ORDER — TUBERCULIN PPD 5 UNIT/0.1ML ID SOLN
5.0000 [IU] | Freq: Once | INTRADERMAL | Status: AC
  Administered 2020-06-12: 5 [IU] via INTRADERMAL
  Filled 2020-06-12 (×2): qty 0.1

## 2020-06-12 MED ORDER — FERROUS SULFATE 325 (65 FE) MG PO TABS: 325.0000 mg | ORAL_TABLET | Freq: Every day | ORAL | 1 refills | Status: AC

## 2020-06-12 MED ORDER — VALPROATE SODIUM 250 MG/5ML PO SOLN: 750.0000 mg | Freq: Four times a day (QID) | ORAL | 1 refills | Status: AC

## 2020-06-12 MED ORDER — HALOPERIDOL 10 MG PO TABS: 10.0000 mg | ORAL_TABLET | Freq: Two times a day (BID) | ORAL | 1 refills | Status: AC

## 2020-06-12 MED ORDER — CLOPIDOGREL BISULFATE 75 MG PO TABS: 75.0000 mg | ORAL_TABLET | Freq: Every day | ORAL | 1 refills | Status: AC

## 2020-06-12 MED ORDER — LORAZEPAM 1 MG PO TABS: 1.0000 mg | ORAL_TABLET | Freq: Two times a day (BID) | ORAL | 1 refills | Status: AC

## 2020-06-12 MED ORDER — STARCH (THICKENING) PO POWD: ORAL | 0 refills | Status: AC

## 2020-06-12 MED ORDER — ZOLPIDEM TARTRATE 5 MG PO TABS: 5.0000 mg | ORAL_TABLET | Freq: Every day | ORAL | 0 refills | Status: AC

## 2020-06-12 NOTE — Progress Notes (Signed)
Recreation Therapy Notes   Date: 06/12/2020  Time: 09:30 am   Location: Courtyard   Behavioral response: Appropriate  Intervention Topic: Leisure    Discussion/Intervention:  Group content today was focused on leisure. The group defined what leisure is and some positive leisure activities they participate in. Individuals identified the difference between good and bad leisure. Participants expressed how they feel after participating in the leisure of their choice. The group discussed how they go about picking a leisure activity and if others are involved in their leisure activities. The patient stated how many leisure activities they have to choose from and reasons why it is important to have leisure time. Individuals participated in the intervention "Exploration of Leisure" where they had a chance to identify new leisure activities as well as benefits of leisure. Clinical Observations/Feedback: Patient came to group and identified dancing as leisure he enjoys. Individual was social with staff and peers while participating in the intervention. Raelea Gosse LRT/CTRS              Taquisha Phung 06/12/2020 10:45 AM

## 2020-06-12 NOTE — BHH Counselor (Signed)
CSW spoke with [facility] owner, Florian Buff [phone]) to confirm placement and any necessary information. Jerline Pain stated that they need a PPD, COVID test, COVID vaccination record, and 30-day supply of medication to allow them time to get pt set up with their doctor. CSW stated that the provider would be made aware of the testing needed and the medication request. Jerline Pain informed CSW that they use [facility] in [place], [place] as their pharmacy. CSW informed Jerline Pain that guardian would be called about pt's vaccination status. She agreed. No other concerns expressed. Contact ended without incident. Provider notified of requests around testing and medication.  CSW contacted Gunnar Fusi at [phone] to follow up regarding pt's vaccination status. CSW was informed that pt has had both vaccinations but not the booster. Mare Ferrari stated that [facility], Zipporah Plants, has a copy which he could send to the group home. Mare Ferrari stated that Leward Quan was out of the office today but could send it on Monday. Mare Ferrari inquired about how her brother was doing which CSW gave her an update. No other concerns expressed. Contact ended without incident.   Florian Buff [facility]) was updated regarding pt vaccination status. She agreed to allowing Care Coordinator to send on Monday.  CSW contacted [facility], Zipporah Plants regarding COVID vaccination record. Unable to make contact but HIPPA compliant voicemail left with contact information for any needed follow up.   Chalmers Guest. Guerry Bruin, MSW, LCSW, [place] 06/12/2020 10:30 AM

## 2020-06-12 NOTE — Tx Team (Signed)
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 acetaminophen (TYLENOL) tablet 650 mg  650 mg Oral Q6H PRN [name], John T, MD      . albuterol (VENTOLIN HFA) 108 (90 Base) MCG/ACT inhaler 2 puff  2 puff Inhalation Q4H PRN [name], John T, MD      . alum & mag hydroxide-simeth (MAALOX/MYLANTA) 200-200-20 MG/5ML suspension 30 mL  30 mL Oral Q4H PRN [name], [name], MD   30 mL at 06/01/20 2014  . benztropine (COGENTIN) tablet 0.5 mg  0.5 mg Oral BID [name], John T, MD   0.5 mg at 06/11/20 1650  . clopidogrel (PLAVIX) tablet 75 mg  75 mg Oral Daily Salley Scarlet, MD   75 mg at 06/11/20 0901  . cloZAPine (CLOZARIL) tablet 200 mg  200 mg Oral Daily Salley Scarlet, MD   200 mg at 06/11/20 0901  . desmopressin (DDAVP) tablet 0.2 mg  0.2 mg Oral QHS Salley Scarlet, MD   0.2 mg at 06/11/20 2109  . docusate sodium (COLACE) capsule 100 mg  100 mg Oral BID PRN [name], [name], MD   100 mg at 05/17/20 0803  . ferrous sulfate tablet 325 mg  325 mg Oral Q breakfast [name], [name], MD   325 mg at 06/11/20 0901  . food thickener (THICK IT) powder   Oral PRN [name], John T, MD      . haloperidol (HALDOL) tablet 10 mg  10 mg Oral BID [name], [name], MD   10 mg at 06/11/20 1650  . hydrocerin (EUCERIN) cream   Topical BID Salley Scarlet, MD   Given at 06/09/20 [phone]  . LORazepam (ATIVAN) tablet 1 mg  1 mg Oral BID Salley Scarlet, MD   1 mg at 06/11/20 1650  . magnesium hydroxide  (MILK OF MAGNESIA) suspension 30 mL  30 mL Oral Daily PRN [name], John T, MD      . melatonin tablet 5 mg  5 mg Oral QHS PRN [name], [name], MD   5 mg at 06/11/20 2109  . midodrine (PROAMATINE) tablet 10 mg  10 mg Oral TID AC [name], [name], MD   10 mg at 06/11/20 0901  . nicotine (NICODERM CQ - dosed in mg/24 hours) patch 21 mg  21 mg Transdermal Daily [name], [name], MD   21 mg at 06/11/20 0903  . ondansetron (ZOFRAN) tablet 4 mg  4 mg Oral Q8H PRN Salley Scarlet, MD      . polyethylene glycol (MIRALAX / GLYCOLAX) packet 17 g  17 g Oral Daily PRN [name], John T, MD      . traZODone (DESYREL) tablet 100 mg  100 mg Oral QHS PRN Caroline Sauger, NP   100 mg at 06/09/20 2112  . tuberculin injection 5 Units  5 Units Intradermal Once Salley Scarlet, MD      . Valproate Sodium (DEPAKENE) solution 750 mg  750 mg Oral QID Salley Scarlet, MD   750 mg at 06/11/20 2110  . zolpidem (AMBIEN)  tablet 5 mg  5 mg Oral QHS Salley Scarlet, MD   5 mg at 06/11/20 2109   PTA Medications: Medications Prior to Admission  Medication Sig Dispense Refill Last Dose  . benztropine (COGENTIN) 0.5 MG tablet Take 0.5 mg by mouth 2 (two) times daily.   05/07/2020 at Unknown time  . divalproex (DEPAKOTE) 500 MG DR tablet Take 500 mg by mouth 3 (three) times daily.     . Ensure (ENSURE) Take 237 mLs by mouth in the morning and at bedtime.     . ferrous sulfate 325 (65 FE) MG tablet Take 325 mg by mouth daily with breakfast.     . food thickener (THICK IT) POWD Take by mouth in the morning, at noon, and at bedtime.     Marland Kitchen lactulose (CHRONULAC) 10 GM/15ML solution Take 20 g by mouth daily.     . melatonin 3 MG TABS tablet Take 3 mg by mouth at bedtime as needed (sleep).     . midodrine (PROAMATINE) 10 MG tablet Take 10 mg by mouth 3 (three) times daily.       Patient Stressors: Health problems Marital or family conflict  Patient Strengths: Supportive family/friends  Treatment Modalities: Medication  Management, Group therapy, Case management,  1 to 1 session with clinician, Psychoeducation, Recreational therapy.   Physician Treatment Plan for Primary Diagnosis: Schizoaffective disorder, bipolar type ([place]) Long Term Goal(s): Improvement in symptoms so as ready for discharge Improvement in symptoms so as ready for discharge   Short Term Goals: Ability to identify changes in lifestyle to reduce recurrence of condition will improve Ability to verbalize feelings will improve Ability to disclose and discuss suicidal ideas Ability to demonstrate self-control will improve Ability to identify and develop effective coping behaviors will improve Ability to maintain clinical measurements within normal limits will improve Compliance with prescribed medications will improve Ability to identify changes in lifestyle to reduce recurrence of condition will improve Ability to verbalize feelings will improve Ability to disclose and discuss suicidal ideas Ability to demonstrate self-control will improve Ability to identify and develop effective coping behaviors will improve Ability to maintain clinical measurements within normal limits will improve Compliance with prescribed medications will improve Ability to identify triggers associated with substance abuse/mental health issues will improve  Medication Management: Evaluate patient's response, side effects, and tolerance of medication regimen.  Therapeutic Interventions: 1 to 1 sessions, Unit Group sessions and Medication administration.  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   Hypotension   Mild asthma without complication   Slow transit constipation  Long Term Goal(s): Improvement in symptoms so as ready for discharge Improvement in symptoms so as  ready for discharge   Short Term Goals: Ability to identify changes in lifestyle to reduce recurrence of condition will improve Ability to verbalize feelings will improve Ability to disclose and discuss suicidal ideas Ability to demonstrate self-control will improve Ability to identify and develop effective coping behaviors will improve Ability to maintain clinical measurements within normal limits will improve Compliance with prescribed medications will improve Ability to identify changes in lifestyle to reduce recurrence of condition will improve Ability to verbalize feelings will improve Ability to disclose and discuss suicidal ideas Ability to demonstrate self-control will improve Ability to identify and develop effective coping behaviors will improve Ability to maintain clinical measurements within normal limits  will improve Compliance with prescribed medications will improve Ability to identify triggers associated with substance abuse/mental health issues will improve     Medication Management: Evaluate patient's response, side effects, and tolerance of medication regimen.  Therapeutic Interventions: 1 to 1 sessions, Unit Group sessions and Medication administration.  Evaluation of Outcomes: Progressing   RN Treatment Plan for Primary Diagnosis: Schizoaffective disorder, bipolar type ([place]) Long Term Goal(s): Knowledge of disease and therapeutic regimen to maintain health will improve  Short Term Goals: Ability to remain free from injury will improve, Ability to verbalize frustration and anger appropriately will improve, Ability to demonstrate self-control, Ability to participate in decision making will improve, Ability to verbalize feelings will improve, Ability to disclose and discuss suicidal ideas, Ability to identify and develop effective coping behaviors will improve and Compliance with prescribed medications will improve  Medication Management: RN will administer medications  as ordered by provider, will assess and evaluate patient's response and provide education to patient for prescribed medication. RN will report any adverse and/or side effects to prescribing provider.  Therapeutic Interventions: 1 on 1 counseling sessions, Psychoeducation, Medication administration, Evaluate responses to treatment, Monitor vital signs and CBGs as ordered, Perform/monitor CIWA, COWS, AIMS and Fall Risk screenings as ordered, Perform wound care treatments as ordered.  Evaluation of Outcomes: Progressing   LCSW Treatment Plan for Primary Diagnosis: Schizoaffective disorder, bipolar type ([place]) Long Term Goal(s): Safe transition to appropriate next level of care at discharge, Engage patient in therapeutic group addressing interpersonal concerns.  Short Term Goals: Engage patient in aftercare planning with referrals and resources, Increase social support, Increase ability to appropriately verbalize feelings, Increase emotional regulation, Facilitate acceptance of mental health diagnosis and concerns, Identify triggers associated with mental health/substance abuse issues and Increase skills for wellness and recovery  Therapeutic Interventions: Assess for all discharge needs, 1 to 1 time with Social worker, Explore available resources and support systems, Assess for adequacy in community support network, Educate family and significant other(s) on suicide prevention, Complete Psychosocial Assessment, Interpersonal group therapy.  Evaluation of Outcomes: Progressing   Progress in Treatment: Attending groups: Yes. Participating in groups: Yes. Taking medication as prescribed: Yes. Toleration medication: Yes. Family/Significant other contact made: Yes, individual(s) contacted:  [name], Guardian/Sister  Patient understands diagnosis: No. Discussing patient identified problems/goals with staff: Yes. Medical problems stabilized or resolved: Yes. Denies suicidal/homicidal ideation:  Yes. Issues/concerns per patient self-inventory: No. Other: None   New problem(s) identified: No, Describe:  None   New Short Term/Long Term Goal(s): Elimination of symptoms of psychosis, medication management for mood stabilization; development of comprehensive mental wellness plan.  Update: 05/13/20, No changes at this time Update: 05/18/20, No changes at this time Update: 05/23/20, No changes at this time Update: 05/28/20, No changes at this time  Update: 06/02/20, No changes at this time Update: 06/07/20, No changes at this time. Update 06/12/20: No changes at this time.   Patient Goals: "I want to go home." Update: 05/13/20, No changes at this time Update: 05/18/20, No changes at this time Update: 05/23/20, No changes at this time Update: 05/28/20, No changes at this time  Update: 06/02/20, No changes at this time Update: 06/07/20, No changes at this time. Update 06/12/20: No changes at this time.   Discharge Plan or Barriers: CSW will assist with development of an appropriate discharge/aftercare plan. Patient will need placement and has a guardian. Update: 05/13/20, Patients behavior has become increasingly aggressive. Referral has been made to [facility] and patient is on waiting list  for [facility]. Update 05/18/20: Patient remains aggressive and isolated to back hall. Patient is on the wait list [place]. Update 05/23/20. Patient is no linger isolated to back hall. Patient is on the wait list for [place]. Update 05/28/20: Patient has potential group home placement. Update 06/02/20: Prior placement fell through, patient has an interview for potential placement on this day at 10:30 AM. Update: 06/07/20, No changes at this time. Update 06/12/20: Pt has placement per [facility]. CSW to follow up with group home to confirm and finalize arrangements.   Reason for Continuation of Hospitalization: Aggression Medical Issues Medication stabilization  Estimated Length of Stay:  Attendees: Patient:  06/12/2020  9:28 AM  Physician: Selina Cooley, MD 06/12/2020 9:28 AM  Nursing:  06/12/2020 9:28 AM  RN Care Manager: 06/12/2020 9:28 AM  Social Worker: Chalmers Guest. Guerry Bruin, MSW, [place], [place] 06/12/2020 9:28 AM  Recreational Therapist:  06/12/2020 9:28 AM  Other: Kiva Martinique, MSW, LCSW-A 06/12/2020 9:28 AM  Other: Paulla Dolly, MSW, [place] 06/12/2020 9:28 AM  Other: 06/12/2020 9:28 AM    Scribe for Treatment Team: Shirl Harris, LCSW 06/12/2020 9:28 AM

## 2020-06-12 NOTE — Progress Notes (Signed)
[facility] MD Progress Note  06/12/2020 11:37 AM Austin Gates  MRN:  [id number]   CC "I'm doing good."  Subjective:  60 year old male with schizoaffective disorder, bipolar type. No acute events overnight. Medication compliant. He continues to deny SI/HI/AH/VH. Remains psychiatrically stable and ready for discharge. Tentative discharge planned for Monday.    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 Hypertension   . Intellectual disability   . Obsessive-compulsive disorder   . Schizo-affective schizophrenia [facility])     Past Surgical History:  Procedure Laterality Date  . COLONOSCOPY WITH PROPOFOL N/A 12/28/2017   Procedure: COLONOSCOPY WITH PROPOFOL;  Surgeon: Jonathon Bellows, MD;  Location: [facility] ENDOSCOPY;  Service: Gastroenterology;  Laterality: N/A;  . HYDROCELE EXCISION Bilateral 02/22/2018   Procedure: bilateral HYDROCELECTOMY ADULT;  Surgeon: Cleon Gustin, MD;  Location: [place];  Service: Urology;  Laterality: Bilateral;  . INSERTION OF ILIAC STENT Left 02/22/2018   Procedure: INSERTION LEFT SUPERIOR FEMORAL ARTERY USING 6MM X 5CM VIABHON STENT with mynx device closure on right femoral artery;  Surgeon: Waynetta Sandy, MD;  Location: [place];  Service: Vascular;  Laterality: Left;  . KNEE ARTHROSCOPY    . SCROTAL EXPLORATION N/A 02/22/2018   Procedure: SCROTUM EXPLORATION;  Surgeon: Cleon Gustin, MD;  Location: [place];  Service: Urology;  Laterality: N/A;  . UPPER EXTREMITY ANGIOGRAM Left 02/22/2018   Procedure: left lower EXTREMITY ANGIOGRAM;  Surgeon: Waynetta Sandy, MD;  Location: [facility] OR;  Service: Vascular;  Laterality: Left;   Family History:  Family History  Problem Relation Age of Onset  . Diabetes Mother    Family Psychiatric  History: See H&P Social History:  Social History   Substance and Sexual Activity  Alcohol Use No     Social History   Substance and Sexual Activity  Drug Use No    Social History   Socioeconomic History  . Marital status: Single    Spouse name: Not on file  . Number of children: Not on file  . Years of education: Not on file  . Highest education level: Not on file  Occupational History  . Occupation: Disabled  Tobacco Use  . Smoking status: Former Smoker    Types: Cigarettes  . Smokeless tobacco: Never Used  Vaping Use  . Vaping Use: Unknown  Substance and Sexual Activity  . Alcohol use: No  . Drug use: No  . Sexual activity: Never  Other Topics Concern  . Not on file  Social History Narrative   ** Merged History Encounter **       Social Determinants of Health   Financial Resource Strain: Not on file  Food Insecurity: Not on file  Transportation Needs: Not on file  Physical Activity: Not on file  Stress: Not on file  Social Connections: Not on file   Additional Social History:   Sleep: Good  Appetite:  Good  Current Medications: Current Facility-Administered Medications  Medication Dose Route Frequency Provider Last Rate Last Admin  . acetaminophen (TYLENOL) tablet 650 mg  650 mg Oral Q6H PRN [name], [name], MD      . albuterol (  VENTOLIN HFA) 108 (90 Base) MCG/ACT inhaler 2 puff  2 puff Inhalation Q4H PRN [name], John T, MD      . alum & mag hydroxide-simeth (MAALOX/MYLANTA) 200-200-20 MG/5ML suspension 30 mL  30 mL Oral Q4H PRN [name], [name], MD   30 mL at 06/01/20 2014  . benztropine (COGENTIN) tablet 0.5 mg  0.5 mg Oral BID [name], [name], MD   0.5 mg at 06/12/20 0935  . clopidogrel (PLAVIX) tablet 75 mg  75 mg Oral Daily Salley Scarlet, MD   75 mg at 06/12/20  0940  . cloZAPine (CLOZARIL) tablet 200 mg  200 mg Oral Daily Salley Scarlet, MD   200 mg at 06/12/20 0935  . desmopressin (DDAVP) tablet 0.2 mg  0.2 mg Oral QHS Salley Scarlet, MD   0.2 mg at 06/11/20 2109  . docusate sodium (COLACE) capsule 100 mg  100 mg Oral BID PRN [name], [name], MD   100 mg at 05/17/20 0803  . ferrous sulfate tablet 325 mg  325 mg Oral Q breakfast [name], [name], MD   325 mg at 06/12/20 0935  . food thickener (THICK IT) powder   Oral PRN [name], John T, MD      . haloperidol (HALDOL) tablet 10 mg  10 mg Oral BID [name], [name], MD   10 mg at 06/12/20 0937  . hydrocerin (EUCERIN) cream   Topical BID Salley Scarlet, MD   Given at 06/09/20 [phone]  . LORazepam (ATIVAN) tablet 1 mg  1 mg Oral BID Salley Scarlet, MD   1 mg at 06/12/20 0934  . magnesium hydroxide (MILK OF MAGNESIA) suspension 30 mL  30 mL Oral Daily PRN [name], John T, MD      . melatonin tablet 5 mg  5 mg Oral QHS PRN [name], [name], MD   5 mg at 06/11/20 2109  . midodrine (PROAMATINE) tablet 10 mg  10 mg Oral TID AC [name], John T, MD   10 mg at 06/12/20 0936  . nicotine (NICODERM CQ - dosed in mg/24 hours) patch 21 mg  21 mg Transdermal Daily [name], [name], MD   21 mg at 06/12/20 0948  . ondansetron (ZOFRAN) tablet 4 mg  4 mg Oral Q8H PRN Salley Scarlet, MD      . polyethylene glycol (MIRALAX / GLYCOLAX) packet 17 g  17 g Oral Daily PRN [name], John T, MD      . traZODone (DESYREL) tablet 100 mg  100 mg Oral QHS PRN Caroline Sauger, NP   100 mg at 06/09/20 2112  . tuberculin injection 5 Units  5 Units Intradermal Once Salley Scarlet, MD      . Valproate Sodium (DEPAKENE) solution 750 mg  750 mg Oral QID Salley Scarlet, MD   750 mg at 06/12/20 0950  . zolpidem (AMBIEN) tablet 5 mg  5 mg Oral QHS Salley Scarlet, MD   5 mg at 06/11/20 2109    Lab Results:  No results found for this or any previous visit (from the past 48 hour(s)).  Blood Alcohol level:  Lab Results  Component  Value Date   [facility] <10 04/30/2020   ETH <10 [id number]    Metabolic Disorder Labs: Lab Results  Component Value Date   HGBA1C 5.4 09/30/2016   MPG 108.28 09/30/2016   MPG 114 09/17/2016   Lab Results  Component Value Date   PROLACTIN 11.8 05/01/2020   Lab Results  Component  Value Date   CHOL 135 09/30/2016   TRIG 104 02/25/2018   HDL 60 09/30/2016   CHOLHDL 2.3 09/30/2016   VLDL 12 09/30/2016   LDLCALC 63 09/30/2016    Physical Findings: AIMS: Facial and Oral Movements Muscles of Facial Expression: None, normal Lips and Perioral Area: None, normal Jaw: None, normal Tongue: Moderate,Extremity Movements Upper (arms, wrists, hands, fingers): None, normal Lower (legs, knees, ankles, toes): None, normal, Trunk Movements Neck, shoulders, hips: None, normal, Overall Severity Severity of abnormal movements (highest score from questions above): None, normal Incapacitation due to abnormal movements: None, normal Patient's awareness of abnormal movements (rate only patient's report): No Awareness, Dental Status Current problems with teeth and/or dentures?: No Does patient usually wear dentures?: No  CIWA:    COWS:     Musculoskeletal: Strength & Muscle Tone: within normal limits Gait & Station: normal Patient leans: N/A  Psychiatric Specialty Exam:  Presentation  General Appearance: Casual  Eye Contact:Fair  Speech:Garbled  Speech Volume:Increased  Handedness:Right   Mood and Affect  Mood:Euthymic  Affect:Constricted   Thought Process  Thought Processes:Goal Directed  Descriptions of Associations:Loose  Orientation:-- (Oriented to person and place)  Thought Content:Perseveration  History of Schizophrenia/Schizoaffective disorder: Yes Duration of Psychotic Symptoms:Greater than 6 months Hallucinations:None Ideas of Reference:None  Suicidal Thoughts:Denies Homicidal Thoughts:Denies  Sensorium  Memory:Immediate Poor; Recent Poor; Remote  Poor  Judgment:Impaired  Insight:Lacking   Executive Functions  Concentration:Poor  Attention Span:Poor  Recall:Poor  Fund of Knowledge:Poor  Language:Poor   Psychomotor Activity  Psychomotor Activity:Normal  Assets  Assets:Desire for Improvement; Financial Resources/Insurance; Social Support   Sleep  Sleep:Good, 8 hours   Physical Exam: Physical Exam  ROS  Blood pressure 112/82, pulse 93, temperature 98.1 F (36.7 C), temperature source Oral, resp. rate 18, height 5\' 11"  (1.803 m), weight 75.8 kg, SpO2 96 %. Body mass index is 23.29 kg/m.   Treatment Plan Summary: Daily contact with patient to assess and evaluate symptoms and progress in treatment and Medication management 60 year old male with schizoaffective disorder, bipolar type and intellectual disability. Remains at psychiatric baseline. Continue medications as above. [place] [address]00 on 06/10/20. Tentative discharge planned for Monday. PPD placed today and will need to be read on Sunday. Covid test ordered to be collected on Sunday.   06/12/20: Psychiatric exam above reviewed and remains accurate. Assessment and plan above reviewed and updated.    Salley Scarlet, MD 06/12/2020, 11:37 AM

## 2020-06-12 NOTE — [facility] Group Notes (Signed)
LCSW Group Therapy Note  06/12/2020 2:17 PM  Type of Therapy and Topic:  Group Therapy:  Feelings around Relapse and Recovery  Participation Level:  Did Not Attend   Description of Group:    Patients in this group will discuss emotions they experience before and after a relapse. They will process how experiencing these feelings, or avoidance of experiencing them, relates to having a relapse. Facilitator will guide patients to explore emotions they have related to recovery. Patients will be encouraged to process which emotions are more powerful. They will be guided to discuss the emotional reaction significant others in their lives may have to their relapse or recovery. Patients will be assisted in exploring ways to respond to the emotions of others without this contributing to a relapse.  Therapeutic Goals: 1. Patient will identify two or more emotions that lead to a relapse for them 2. Patient will identify two emotions that result when they relapse 3. Patient will identify two emotions related to recovery 4. Patient will demonstrate ability to communicate their needs through discussion and/or role plays   Summary of Patient Progress: X  Therapeutic Modalities:   Cognitive Behavioral Therapy Solution-Focused Therapy Assertiveness Training Relapse Prevention Therapy   Hedy Camara R. Guerry Bruin, MSW, LCSW, [place] 06/12/2020 2:17 PM

## 2020-06-12 NOTE — Plan of Care (Signed)
Pt in bed unit around dinner, then up and walking around. He denies SI/HI and AV hallucinations. He has been observed talking to himself and responding to internal stimuli. He has minimal interaction with his peers and no group participations. He is hard to understand at times because he mumbles and has no teeth.  Problem: Education: Goal: Knowledge of [place] Education information/materials will improve Outcome: Progressing Goal: Emotional status will improve Outcome: Progressing Goal: Mental status will improve Outcome: Progressing Goal: Verbalization of understanding the information provided will improve Outcome: Progressing   Problem: Activity: Goal: Interest or engagement in activities will improve Outcome: Progressing Goal: Sleeping patterns will improve Outcome: Progressing   Problem: Coping: Goal: Ability to verbalize frustrations and anger appropriately will improve Outcome: Progressing Goal: Ability to demonstrate self-control will improve Outcome: Progressing   Problem: Health Behavior/Discharge Planning: Goal: Identification of resources available to assist in meeting health care needs will improve Outcome: Progressing Goal: Compliance with treatment plan for underlying cause of condition will improve Outcome: Progressing   Problem: Physical Regulation: Goal: Ability to maintain clinical measurements within normal limits will improve Outcome: Progressing   Problem: Safety: Goal: Periods of time without injury will increase Outcome: Progressing   Problem: Education: Goal: Ability to verbalize precipitating factors for violent behavior will improve Outcome: Progressing   Problem: Coping: Goal: Ability to verbalize frustrations and anger appropriately will improve Outcome: Progressing   Problem: Health Behavior/Discharge Planning: Goal: Ability to implement measures to prevent violent behavior in the future will improve Outcome: Progressing    Problem: Safety: Goal: Ability to demonstrate self-control will improve Outcome: Progressing Goal: Ability to redirect hostility and anger into socially appropriate behaviors will improve Outcome: Progressing

## 2020-06-13 NOTE — Progress Notes (Addendum)
[facility] MD Progress Note  06/13/2020 9:41 AM Austin Gates  MRN:  [id number]   CC "I'm good."  Subjective:    Patient denies having any issues today.   Ask if heis discharging tomorrow.   Denies auditory and visual hallucinations. Denies depression. "I'm good."  No negative behaviors noted.   Denies pain. No new medication issues.    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 Hypertension   . Intellectual disability   . Obsessive-compulsive disorder   . Schizo-affective schizophrenia [facility])     Past Surgical History:  Procedure Laterality Date  . COLONOSCOPY WITH PROPOFOL N/A 12/28/2017   Procedure: COLONOSCOPY WITH PROPOFOL;  Surgeon: Jonathon Bellows, MD;  Location[facility] ENDOSCOPY;  Service: Gastroenterology;  Laterality: N/A;  . HYDROCELE EXCISION Bilateral 02/22/2018   Procedure: bilateral HYDROCELECTOMY ADULT;  Surgeon: Cleon Gustin, MD;  Location: [place];  Service: Urology;  Laterality: Bilateral;  . INSERTION OF ILIAC STENT Left 02/22/2018   Procedure: INSERTION LEFT SUPERIOR FEMORAL ARTERY USING 6MM X 5CM VIABHON STENT with mynx device closure on right femoral artery;  Surgeon: Waynetta Sandy, MD;  Location: [place];  Service: Vascular;  Laterality: Left;  . KNEE ARTHROSCOPY    . SCROTAL EXPLORATION N/A 02/22/2018   Procedure: SCROTUM EXPLORATION;  Surgeon: Cleon Gustin, MD;  Location: [place];  Service: Urology;  Laterality: N/A;  . UPPER EXTREMITY ANGIOGRAM Left 02/22/2018   Procedure: left lower EXTREMITY ANGIOGRAM;  Surgeon: Waynetta Sandy, MD;   Location: [facility] OR;  Service: Vascular;  Laterality: Left;   Family History:  Family History  Problem Relation Age of Onset  . Diabetes Mother    Family Psychiatric  History: See H&P Social History:  Social History   Substance and Sexual Activity  Alcohol Use No     Social History   Substance and Sexual Activity  Drug Use No    Social History   Socioeconomic History  . Marital status: Single    Spouse name: Not on file  . Number of children: Not on file  . Years of education: Not on file  . Highest education level: Not on file  Occupational History  . Occupation: Disabled  Tobacco Use  . Smoking status: Former Smoker    Types: Cigarettes  . Smokeless tobacco: Never Used  Vaping Use  . Vaping Use: Unknown  Substance and Sexual Activity  . Alcohol use: No  . Drug use: No  . Sexual activity: Never  Other Topics Concern  . Not on file  Social History Narrative   ** Merged History Encounter **       Social Determinants of Health   Financial Resource Strain: Not on file  Food Insecurity: Not on file  Transportation Needs: Not on file  Physical Activity: Not on file  Stress: Not on file  Social Connections: Not on file   Additional Social History:   Sleep: Good  Appetite:  Good  Current Medications: Current Facility-Administered Medications  Medication Dose Route Frequency Provider Last Rate Last Admin  . acetaminophen (TYLENOL) tablet 650 mg  650 mg Oral Q6H PRN [name], John T,  MD      . albuterol (VENTOLIN HFA) 108 (90 Base) MCG/ACT inhaler 2 puff  2 puff Inhalation Q4H PRN [name], John T, MD      . alum & mag hydroxide-simeth (MAALOX/MYLANTA) 200-200-20 MG/5ML suspension 30 mL  30 mL Oral Q4H PRN [name], [name], MD   30 mL at 06/01/20 2014  . benztropine (COGENTIN) tablet 0.5 mg  0.5 mg Oral BID [name], John T, MD   0.5 mg at 06/13/20 0930  . clopidogrel (PLAVIX) tablet 75 mg  75 mg Oral Daily Salley Scarlet, MD   75 mg at 06/13/20 0929  . cloZAPine  (CLOZARIL) tablet 200 mg  200 mg Oral Daily Salley Scarlet, MD   200 mg at 06/13/20 0929  . desmopressin (DDAVP) tablet 0.2 mg  0.2 mg Oral QHS Salley Scarlet, MD   0.2 mg at 06/12/20 2201  . docusate sodium (COLACE) capsule 100 mg  100 mg Oral BID PRN [name], [name], MD   100 mg at 05/17/20 0803  . ferrous sulfate tablet 325 mg  325 mg Oral Q breakfast [name], [name], MD   325 mg at 06/13/20 0930  . food thickener (THICK IT) powder   Oral PRN [name], John T, MD      . haloperidol (HALDOL) tablet 10 mg  10 mg Oral BID [name], [name], MD   10 mg at 06/13/20 0930  . hydrocerin (EUCERIN) cream   Topical BID Salley Scarlet, MD   Given at 06/13/20 [phone]  . LORazepam (ATIVAN) tablet 1 mg  1 mg Oral BID Salley Scarlet, MD   1 mg at 06/13/20 2353  . magnesium hydroxide (MILK OF MAGNESIA) suspension 30 mL  30 mL Oral Daily PRN [name], John T, MD      . melatonin tablet 5 mg  5 mg Oral QHS PRN [name], [name], MD   5 mg at 06/12/20 2200  . midodrine (PROAMATINE) tablet 10 mg  10 mg Oral TID AC [name], John T, MD   10 mg at 06/13/20 0929  . nicotine (NICODERM CQ - dosed in mg/24 hours) patch 21 mg  21 mg Transdermal Daily [name], [name], MD   21 mg at 06/13/20 0931  . ondansetron (ZOFRAN) tablet 4 mg  4 mg Oral Q8H PRN Salley Scarlet, MD      . polyethylene glycol (MIRALAX / GLYCOLAX) packet 17 g  17 g Oral Daily PRN [name], John T, MD      . traZODone (DESYREL) tablet 100 mg  100 mg Oral QHS PRN Caroline Sauger, NP   100 mg at 06/12/20 2200  . tuberculin injection 5 Units  5 Units Intradermal Once Salley Scarlet, MD   5 Units at 06/12/20 1303  . Valproate Sodium (DEPAKENE) solution 750 mg  750 mg Oral QID Salley Scarlet, MD   750 mg at 06/13/20 0929  . zolpidem (AMBIEN) tablet 5 mg  5 mg Oral QHS Salley Scarlet, MD   5 mg at 06/12/20 2201    Lab Results:  No results found for this or any previous visit (from the past 48 hour(s)).  Blood Alcohol level:  Lab Results   Component Value Date   [facility] <10 04/30/2020   ETH <10 61/44/3154    Metabolic Disorder Labs: Lab Results  Component Value Date   HGBA1C 5.4 09/30/2016   MPG 108.28 09/30/2016   MPG 114 09/17/2016   Lab Results  Component Value Date  PROLACTIN 11.8 05/01/2020   Lab Results  Component Value Date   CHOL 135 09/30/2016   TRIG 104 02/25/2018   HDL 60 09/30/2016   CHOLHDL 2.3 09/30/2016   VLDL 12 09/30/2016   LDLCALC 63 09/30/2016    Physical Findings: AIMS: Facial and Oral Movements Muscles of Facial Expression: None, normal Lips and Perioral Area: None, normal Jaw: None, normal Tongue: Moderate,Extremity Movements Upper (arms, wrists, hands, fingers): None, normal Lower (legs, knees, ankles, toes): None, normal, Trunk Movements Neck, shoulders, hips: None, normal, Overall Severity Severity of abnormal movements (highest score from questions above): None, normal Incapacitation due to abnormal movements: None, normal Patient's awareness of abnormal movements (rate only patient's report): No Awareness, Dental Status Current problems with teeth and/or dentures?: No Does patient usually wear dentures?: No  CIWA:    COWS:     Musculoskeletal: Strength & Muscle Tone: within normal limits Gait & Station: normal Patient leans: N/A  Psychiatric Specialty Exam:  Presentation  General Appearance: Casual  Eye Contact:Fair  Speech:Garbled  Speech Volume:Increased  Handedness:Right   Mood and Affect  Mood: "i'm good."  Affect:Constricted   Thought Process  Thought Processes:Goal Directed  Descriptions of Associations:Loose  Orientation:-- (Oriented to person and place)  Thought Content:Perseveration  History of Schizophrenia/Schizoaffective disorder: Yes Duration of Psychotic Symptoms:Greater than 6 months Hallucinations:None Ideas of Reference:None  Suicidal Thoughts:Denies Homicidal Thoughts:Denies  Sensorium  Memory:Immediate Poor; Recent Poor;  Remote Poor  Judgment:Impaired  Insight:Lacking   Executive Functions  Concentration:Poor  Attention Span:Poor  Recall:Poor  Fund of Knowledge:Poor  Language:Poor   Psychomotor Activity  Psychomotor Activity:Normal  Assets  Assets:Desire for Improvement; Financial Resources/Insurance; Social Support   Sleep  Sleep:Good, 8 hours   Physical Exam: Physical Exam ROS Blood pressure 113/81, pulse 76, temperature 98.2 F (36.8 C), temperature source Oral, resp. rate 18, height 5\' 11"  (1.803 m), weight 75.8 kg, SpO2 98 %. Body mass index is 23.29 kg/m.   Treatment Plan Summary: Daily contact with patient to assess and evaluate symptoms and progress in treatment and Medication management 60 year old male with schizoaffective disorder, bipolar type and intellectual disability. Remains at psychiatric baseline. Continue medications as above. [place] 3300 on 06/10/20. Tentative discharge planned for Monday. PPD placed today and will need to be read on Sunday. Covid test ordered to be collected on Sunday.   06/12/20: Psychiatric exam above reviewed and remains accurate. Assessment and plan above reviewed and updated.   4/23 No changes   Rulon Sera, MD 06/13/2020, 9:41 AM

## 2020-06-13 NOTE — Progress Notes (Signed)
Pt is alert and oriented to person, place, time and situation. Pt has been calm, cooperative, childlike, pleasant. Pt is medication complaint, social with peers and staff. Pt is out for meals, attends groups. Pt denies suicidal and homicidal ideation, denies hallucinations, denies depression and anxiety. Pt voices no complaints. Will continue to monitor pt per Q15 minute face checks and monitor for safety and progress.

## 2020-06-13 NOTE — [facility] Group Notes (Signed)
LCSW Group Therapy Note  06/13/2020 1:59 PM  Type of Therapy and Topic:  Group Therapy: Avoiding Self-Sabotaging and Enabling Behaviors  Participation Level:  Did Not Attend   Description of Group:   In this group, patients will learn how to identify obstacles, self-sabotaging and enabling behaviors, as well as: what are they, why do we do them and what needs these behaviors meet. Discuss unhealthy relationships and how to have positive healthy boundaries with those that sabotage and enable. Explore aspects of self-sabotage and enabling in yourself and how to limit these self-destructive behaviors in everyday life.   Therapeutic Goals: 1. Patient will identify one obstacle that relates to self-sabotage and enabling behaviors 2. Patient will identify one personal self-sabotaging or enabling behavior they did prior to admission 3. Patient will state a plan to change the above identified behavior 4. Patient will demonstrate ability to communicate their needs through discussion and/or role play.   Summary of Patient Progress: Patient did not attend group despite encouraged participation.      Therapeutic Modalities:   Cognitive Behavioral Therapy Person-Centered Therapy Motivational Interviewing   Austin Gates, MSW, [place], [place] 06/13/2020 1:59 PM

## 2020-06-13 NOTE — Progress Notes (Signed)
Cooperative with treatment, medication complaint, speech remains garbled and he requires some redirection to stay on task. He appears to be in bed resting quietly.

## 2020-06-14 NOTE — Progress Notes (Addendum)
Austin Mercy MD Progress Note  06/14/2020 11:51 AM [name]  MRN:  [id number]   CC "Good."  Subjective:   "I good," the pt mumbles.   Denies issues with depression, irritability, and anxiety.   No aggression on the unit.   Sleeping well.   Taking medications as prescribed.   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 Hypertension   . Intellectual disability   . Obsessive-compulsive disorder   . Schizo-affective schizophrenia [facility])     Past Surgical History:  Procedure Laterality Date  . COLONOSCOPY WITH PROPOFOL N/A 12/28/2017   Procedure: COLONOSCOPY WITH PROPOFOL;  Surgeon: Jonathon Bellows, MD;  Location: [facility] ENDOSCOPY;  Service: Gastroenterology;  Laterality: N/A;  . HYDROCELE EXCISION Bilateral 02/22/2018   Procedure: bilateral HYDROCELECTOMY ADULT;  Surgeon: Cleon Gustin, MD;  Location: [place];  Service: Urology;  Laterality: Bilateral;  . INSERTION OF ILIAC STENT Left 02/22/2018   Procedure: INSERTION LEFT SUPERIOR FEMORAL ARTERY USING 6MM X 5CM VIABHON STENT with mynx device closure on right femoral artery;  Surgeon: Waynetta Sandy, MD;  Location: [place];  Service: Vascular;  Laterality: Left;  . KNEE ARTHROSCOPY    . SCROTAL EXPLORATION N/A 02/22/2018   Procedure: SCROTUM EXPLORATION;  Surgeon: Cleon Gustin, MD;  Location: [place];  Service: Urology;  Laterality: N/A;  . UPPER EXTREMITY ANGIOGRAM Left 02/22/2018   Procedure: left lower EXTREMITY ANGIOGRAM;  Surgeon: Waynetta Sandy, MD;  Location: [facility] OR;  Service: Vascular;  Laterality: Left;    Family History:  Family History  Problem Relation Age of Onset  . Diabetes Mother    Family Psychiatric  History: See H&P Social History:  Social History   Substance and Sexual Activity  Alcohol Use No     Social History   Substance and Sexual Activity  Drug Use No    Social History   Socioeconomic History  . Marital status: Single    Spouse name: Not on file  . Number of children: Not on file  . Years of education: Not on file  . Highest education level: Not on file  Occupational History  . Occupation: Disabled  Tobacco Use  . Smoking status: Former Smoker    Types: Cigarettes  . Smokeless tobacco: Never Used  Vaping Use  . Vaping Use: Unknown  Substance and Sexual Activity  . Alcohol use: No  . Drug use: No  . Sexual activity: Never  Other Topics Concern  . Not on file  Social History Narrative   ** Merged History Encounter **       Social Determinants of Health   Financial Resource Strain: Not on file  Food Insecurity: Not on file  Transportation Needs: Not on file  Physical Activity: Not on file  Stress: Not on file  Social Connections: Not on file   Additional Social History:   Sleep: Good  Appetite:  Good  Current Medications: Current Facility-Administered Medications  Medication Dose Route Frequency Provider Last Rate Last Admin  . acetaminophen (TYLENOL) tablet 650 mg  650 mg Oral Q6H PRN [name], [name], MD      . albuterol (VENTOLIN  HFA) 108 (90 Base) MCG/ACT inhaler 2 puff  2 puff Inhalation Q4H PRN [name], John T, MD      . alum & mag hydroxide-simeth (MAALOX/MYLANTA) 200-200-20 MG/5ML suspension 30 mL  30 mL Oral Q4H PRN [name], [name], MD   30 mL at 06/01/20 2014  . benztropine (COGENTIN) tablet 0.5 mg  0.5 mg Oral BID [name], [name], MD   0.5 mg at 06/14/20 0854  . clopidogrel (PLAVIX) tablet 75 mg  75 mg Oral Daily Salley Scarlet, MD   75 mg at 06/14/20 0855  . cloZAPine (CLOZARIL) tablet 200 mg  200 mg Oral Daily Salley Scarlet, MD   200 mg at 06/14/20 0857  . desmopressin (DDAVP) tablet 0.2 mg  0.2 mg Oral QHS Salley Scarlet, MD   0.2 mg at 06/13/20 2050  . docusate sodium (COLACE) capsule 100 mg  100 mg Oral BID PRN [name], [name], MD   100 mg at 05/17/20 0803  . ferrous sulfate tablet 325 mg  325 mg Oral Q breakfast [name], [name], MD   325 mg at 06/14/20 0855  . food thickener (THICK IT) powder   Oral PRN [name], John T, MD      . haloperidol (HALDOL) tablet 10 mg  10 mg Oral BID [name], [name], MD   10 mg at 06/14/20 0855  . hydrocerin (EUCERIN) cream   Topical BID Salley Scarlet, MD   Given at 06/14/20 [id number]  . LORazepam (ATIVAN) tablet 1 mg  1 mg Oral BID Salley Scarlet, MD   1 mg at 06/14/20 0855  . magnesium hydroxide (MILK OF MAGNESIA) suspension 30 mL  30 mL Oral Daily PRN [name], John T, MD      . melatonin tablet 5 mg  5 mg Oral QHS PRN [name], [name], MD   5 mg at 06/12/20 2200  . midodrine (PROAMATINE) tablet 10 mg  10 mg Oral TID AC [name], [name], MD   10 mg at 06/14/20 0855  . nicotine (NICODERM CQ - dosed in mg/24 hours) patch 21 mg  21 mg Transdermal Daily [name], [name], MD   21 mg at 06/14/20 0857  . ondansetron (ZOFRAN) tablet 4 mg  4 mg Oral Q8H PRN Salley Scarlet, MD      . polyethylene glycol (MIRALAX / GLYCOLAX) packet 17 g  17 g Oral Daily PRN [name], John T, MD      . traZODone (DESYREL) tablet 100 mg  100 mg Oral QHS PRN Caroline Sauger, NP   100 mg at 06/12/20 2200  . tuberculin injection 5 Units  5 Units Intradermal Once Salley Scarlet, MD   5 Units at 06/12/20 1303  . Valproate Sodium (DEPAKENE) solution 750 mg  750 mg Oral QID Salley Scarlet, MD   750 mg at 06/14/20 0855  . zolpidem (AMBIEN) tablet 5 mg  5 mg Oral QHS Salley Scarlet, MD   5 mg at 06/13/20 2050    Lab Results:  No results found for this or any previous visit (from the past 48 hour(s)).  Blood Alcohol level:  Lab Results  Component Value Date   [facility] <10 04/30/2020   ETH <10  96/05/5407    Metabolic Disorder Labs: Lab Results  Component Value Date   HGBA1C 5.4 09/30/2016   MPG 108.28 09/30/2016   MPG 114 09/17/2016   Lab Results  Component Value Date   PROLACTIN 11.8 05/01/2020   Lab Results  Component Value Date   CHOL 135 09/30/2016   TRIG 104 02/25/2018   HDL 60 09/30/2016   CHOLHDL 2.3 09/30/2016   VLDL 12 09/30/2016   LDLCALC 63 09/30/2016    Physical Findings: AIMS: Facial and Oral Movements Muscles of Facial Expression: None, normal Lips and Perioral Area: None, normal Jaw: None, normal Tongue: Moderate,Extremity Movements Upper (arms, wrists, hands, fingers): None, normal Lower (legs, knees, ankles, toes): None, normal, Trunk Movements Neck, shoulders, hips: None, normal, Overall Severity Severity of abnormal movements (highest score from questions above): None, normal Incapacitation due to abnormal movements: None, normal Patient's awareness of abnormal movements (rate only patient's report): No Awareness, Dental Status Current problems with teeth and/or dentures?: No Does patient usually wear dentures?: No  CIWA:    COWS:     Musculoskeletal: Strength & Muscle Tone: within normal limits Gait & Station: normal Patient leans: N/A  Psychiatric Specialty Exam:  Presentation  General Appearance: hospital attaire Eye Contact:good  Speech:Garbled  Speech Volume:Increased  Handedness:Right   Mood and Affect  Mood: "good."  Affect:Neutral  Thought Process  Thought Processes:Goal Directed  Descriptions of Associations:Loose  Orientation:-- (Oriented to person and place)  Thought Content:Perseveration  History of Schizophrenia/Schizoaffective disorder: Yes Duration of Psychotic Symptoms:Greater than 6 months Hallucinations:None Ideas of Reference:None  Suicidal Thoughts:Denies Homicidal Thoughts:Denies  Sensorium  Memory:Immediate Poor; Recent Poor; Remote  Poor  Judgment:Impaired  Insight:Lacking   Executive Functions  Concentration:Poor  Attention Span:Poor  Recall:Poor  Fund of Knowledge:Poor  Language:Poor   Psychomotor Activity  Psychomotor Activity:Normal  Assets  Assets:Desire for Improvement; Financial Resources/Insurance; Social Support   Sleep  Sleep:Good, 8 hours   Physical Exam: Physical Exam ROS Blood pressure 109/83, pulse 86, temperature 97.6 F (36.4 C), temperature source Oral, resp. rate 17, height 5\' 11"  (1.803 m), weight 75.8 kg, SpO2 98 %. Body mass index is 23.29 kg/m.   Treatment Plan Summary: Daily contact with patient to assess and evaluate symptoms and progress in treatment and Medication management 60 year old male with schizoaffective disorder, bipolar type and intellectual disability. Remains at psychiatric baseline. Continue medications as above. [place] 3300 on 06/10/20. Tentative discharge planned for Monday. PPD placed today and will need to be read on Sunday. Covid test ordered to be collected on Sunday.   06/12/20: Psychiatric exam above reviewed and remains accurate. Assessment and plan above reviewed and updated.   4/23 No changes  4/24 COVID test today  CPT: 19622 Rulon Sera, MD 06/14/2020, 11:51 AM

## 2020-06-14 NOTE — Progress Notes (Addendum)
Pt has been alert and oriented to person, place, time and situation. Pt is irritable, anxious, refused to allow this writer to place covid swab in nose. Left extra swabs in nursing station for pending future attempts. Pt attended outdoor group. Pt is medication compliant after initially refusing AM meds and refusing to wake and go to breakfast, but with a great deal of encouragement pt complied. Pt allowed with difficulty this writer to read pt's PPD which was charted at 1300, read results = zero induration, and MD notified via secure chat. Pt denies suicidal and homicidal ideation, denies hallucinations. No agitation noted. Will continue to monitor pt per Q15 minute face checks and monitor for safety and progress.

## 2020-06-14 NOTE — Plan of Care (Signed)
  Problem: Education: Goal: Knowledge of [place] [facility] Education information/materials will improve Outcome: Progressing Goal: Emotional status will improve Outcome: Progressing Goal: Mental status will improve Outcome: Progressing Goal: Verbalization of understanding the information provided will improve Outcome: Progressing   Problem: Activity: Goal: Interest or engagement in activities will improve Outcome: Progressing Goal: Sleeping patterns will improve Outcome: Progressing   Problem: Coping: Goal: Ability to verbalize frustrations and anger appropriately will improve Outcome: Progressing Goal: Ability to demonstrate self-control will improve Outcome: Progressing   Problem: Health Behavior/Discharge Planning: Goal: Identification of resources available to assist in meeting health care needs will improve Outcome: Progressing Goal: Compliance with treatment plan for underlying cause of condition will improve Outcome: Progressing   Problem: Physical Regulation: Goal: Ability to maintain clinical measurements within normal limits will improve Outcome: Progressing   Problem: Safety: Goal: Periods of time without injury will increase Outcome: Progressing   Problem: Education: Goal: Ability to verbalize precipitating factors for violent behavior will improve Outcome: Progressing   Problem: Coping: Goal: Ability to verbalize frustrations and anger appropriately will improve Outcome: Progressing   Problem: Health Behavior/Discharge Planning: Goal: Ability to implement measures to prevent violent behavior in the future will improve Outcome: Progressing   Problem: Safety: Goal: Ability to demonstrate self-control will improve Outcome: Progressing Goal: Ability to redirect hostility and anger into socially appropriate behaviors will improve Outcome: Progressing   

## 2020-06-14 NOTE — Progress Notes (Signed)
Patient has been pleasant, calm and cooperative. Asking for multiple containers of ice cream, but redirectable

## 2020-06-15 LAB — RESP PANEL BY RT-PCR (FLU A&B, COVID) ARPGX2
Influenza A by PCR: NEGATIVE
Influenza B by PCR: NEGATIVE
SARS Coronavirus 2 by RT PCR: NEGATIVE

## 2020-06-15 NOTE — Progress Notes (Signed)
Patient has been more attention-seeking this shift. Stayed up late because he said he was excited about being discharged. Singing and dancing. Repeatedly asking for snacks. Given Trazodone per prn order in some pudding. Denies SI, HI and AVH

## 2020-06-15 NOTE — Care Management Important Message (Signed)
Important Message  Patient Details  Name: Austin Gates MRN: [id number] Date of Birth: 09/02/1960   Medicare Important Message Given:  Other (see comment) (Guardian [name] was made aware via telephone and given number for [facility])     Shirl Harris, LCSW 06/15/2020, 11:19 AM

## 2020-06-15 NOTE — Plan of Care (Signed)
  Problem: Disruptive/Impulsive Goal: STG - Patient will focus on task/topic with 2 prompts from staff within 5 recreation therapy group sessions Description: STG - Patient will focus on task/topic with 2 prompts from staff within 5 recreation therapy group sessions Outcome: Completed/Met

## 2020-06-15 NOTE — Progress Notes (Signed)
Patient slept in this morning. When awakened he ate well and took all of his medications. He is seen interacting safely on the unit and he is pleasant and calm. He had no complaints this shift.   Patient belongings were returned to him. I explained his discharge instructions including the AVS documentation, medications, and follow-up appointments. He was escorted safely off the unit with staff.
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
## 2020-06-15 NOTE — Progress Notes (Signed)
Recreation Therapy Notes      Date: 06/15/2020  Time: 09:30 am   Location: Courtyard   Behavioral response: Appropriate  Intervention Topic: Wellness    Discussion/Intervention:  Group content today was focused on Wellness. The group defined wellness and some positive ways they make decisions for themselves. Individuals expressed reasons why they neglected any wellness in the past. Patients described ways to improve wellness skills in the future. The group explained what could happen if they did not do any wellness at all. Participants express how bad choices has affected them and others around them. Individual explained the importance of wellness. The group participated in the intervention "Testing my Wellness" where they had a chance to identify some of their weaknesses and strengths in wellness.  Clinical Observations/Feedback: Patient came to group late and was focused on the topic. Individual was social with staff and peers while participating in the intervention. Maggie Senseney LRT/CTRS         Titus Drone 06/15/2020 10:40 AM

## 2020-06-15 NOTE — Plan of Care (Signed)
  Problem: Education: Goal: Knowledge of [place] [facility] Education information/materials will improve Outcome: Progressing Goal: Emotional status will improve Outcome: Progressing Goal: Mental status will improve Outcome: Progressing Goal: Verbalization of understanding the information provided will improve Outcome: Progressing   Problem: Activity: Goal: Interest or engagement in activities will improve Outcome: Progressing Goal: Sleeping patterns will improve Outcome: Progressing   Problem: Coping: Goal: Ability to verbalize frustrations and anger appropriately will improve Outcome: Progressing Goal: Ability to demonstrate self-control will improve Outcome: Progressing   Problem: Health Behavior/Discharge Planning: Goal: Identification of resources available to assist in meeting health care needs will improve Outcome: Progressing Goal: Compliance with treatment plan for underlying cause of condition will improve Outcome: Progressing   Problem: Physical Regulation: Goal: Ability to maintain clinical measurements within normal limits will improve Outcome: Progressing   Problem: Safety: Goal: Periods of time without injury will increase Outcome: Progressing   Problem: Education: Goal: Ability to verbalize precipitating factors for violent behavior will improve Outcome: Progressing   Problem: Coping: Goal: Ability to verbalize frustrations and anger appropriately will improve Outcome: Progressing   Problem: Health Behavior/Discharge Planning: Goal: Ability to implement measures to prevent violent behavior in the future will improve Outcome: Progressing   Problem: Safety: Goal: Ability to demonstrate self-control will improve Outcome: Progressing Goal: Ability to redirect hostility and anger into socially appropriate behaviors will improve Outcome: Progressing   

## 2020-06-15 NOTE — Progress Notes (Signed)
  Patient TB was negative, 71mm induration.  06/14/20 1300 06/14/20 1719  PPD Results  Does patient have an induration at the injection site? No No  Induration(mm) 0 mm 0 mm  Name of Physician Notified Dr. Selina Cooley, MD Selina Cooley, MD

## 2020-06-15 NOTE — BHH Counselor (Signed)
CSW spoke with Zipporah Plants [phone]) regarding COVID vaccination record. Leward Quan stated that he would send a copy of the record over to the group home. He also asked questions regarding pt dentures, follow up, and requested a copy of D/C summary. Leward Quan was informed that CSW was not aware of pt having any dentures while here. Leward Quan and CSW discussed aftercare. He was informed that most group homes may have providers that they already work with so CSW would follow up with group home. Leward Quan also requested a copy of his discharge paperwork. CSW informed him that it was uncertain if this could be done but that he would follow up regarding this. He was informed that he could contact medical records for a copy of the discharge summary.  CSW contacted Florian Buff of [facility] [phone]) to follow up regarding aftercare arrangements. She stated that the group home makes all psychiatric and medical arrangements with [facility] and [employer] respectively. CSW informed Jerline Pain that COVID test results had not arrived at the moment and CSW would follow up with nursing staff regarding this issue. She agreed. She stated that they would need a negative test result prior to pt coming to the group home. CSW informed her that he would speak with the nursing staff to figure out this part.   Chalmers Guest. Guerry Bruin, MSW, [place], [place] 06/15/2020 10:38 AM

## 2020-06-15 NOTE — BHH Suicide Risk Assessment (Signed)
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   HENT: Positive for drooling and trouble swallowing.   Eyes: Negative.   Respiratory: Negative.   Cardiovascular: Negative.   Gastrointestinal: Negative.   Endocrine: Negative.   Genitourinary: Negative.   Musculoskeletal: Negative.   Skin: Negative.   Allergic/Immunologic: Negative.   Neurological: Negative.   Hematological: Negative.   Psychiatric/Behavioral: Negative for agitation, behavioral problems, hallucinations, self-injury, sleep disturbance and suicidal ideas.    Blood pressure 113/81, pulse 77, temperature 98.7 F (37.1 C), temperature source Oral, resp. rate 17, height 5\' 11"  (1.803 m), weight 75.8 kg, SpO2 100 %.Body mass index is 23.29 kg/m.  General Appearance: Casual  Eye Contact::  Good  Speech:  Dysarthric  Volume:  Increased  Mood:  Euthymic  Affect:  Congruent  Thought Process:  Coherent  Orientation:  Full (Time, Place, and Person)  Thought Content:  Logical  Suicidal Thoughts:  No  Homicidal Thoughts:  No  Memory:  Immediate;   Fair Recent;   Fair Remote;   Poor  Judgement:  Intact  Insight:  Shallow  Psychomotor Activity:  Normal  Concentration:  Fair  Recall:  Poor  Fund of Knowledge:Poor  Language: Poor  Akathisia:  Negative  Handed:  Right  AIMS (if indicated):      Assets:  Desire for Improvement Financial Resources/Insurance Housing Resilience Social Support  Sleep:  Number of Hours: 4.25  Cognition: WNL  ADL's:  Intact   Mental Status Per Nursing Assessment::   On Admission:  NA  Demographic Factors:  Male  Loss Factors: NA  Historical Factors: Impulsivity  Risk Reduction Factors:   Sense of responsibility to family, Positive social support, Positive therapeutic relationship and Positive coping skills or problem solving skills  Continued Clinical Symptoms:  Schizophrenia:   Paranoid or undifferentiated type Previous Psychiatric Diagnoses and Treatments  Cognitive Features That Contribute To Risk:  None    Suicide Risk:  Minimal: No identifiable suicidal ideation.  Patients presenting with no risk factors but with morbid ruminations; may be classified as minimal risk based on the severity of the depressive symptoms    Plan Of Care/Follow-up recommendations:  Activity:  as tolerated Diet:  dyphagia 2 diet, honey thick liquids  Salley Scarlet, MD 06/15/2020, 9:39 AM

## 2020-06-15 NOTE — Progress Notes (Signed)
  [facility] Adult Case Management Discharge Plan :  Will you be returning to the same living situation after discharge:  No. At discharge, do you have transportation home?: Yes,  transportation provided by [facility]) Do you have the ability to pay for your medications: Yes,  Medicaid  Release of information consent forms completed and in the chart;  Patient's signature needed at discharge.  Patient to Follow up at:  Follow-up Information    Services, [facility] Follow up.   Why: Although group home utilizes [facility] for psychiatic medication [facility] for medical services, this is an additional resource if needed. Contact information: [place] [address] [phone]               Next level of care provider has access to [place] and Suicide Prevention discussed: Yes,  SPE completed with guardian, [name].  Have you used any form of tobacco in the last 30 days? (Cigarettes, Smokeless Tobacco, Cigars, and/or Pipes): No  Has patient been referred to the Quitline?: Patient refused referral  Patient has been referred for addiction treatment: Pt. refused referral  Shirl Harris, LCSW 06/15/2020, 11:24 AM

## 2020-06-15 NOTE — Progress Notes (Signed)
Recreation Therapy Notes  INPATIENT RECREATION TR PLAN  Patient Details Name: Austin Gates MRN: [id number] DOB: December 05, 1960 Today's Date: 06/15/2020  Rec Therapy Plan Is patient appropriate for Therapeutic Recreation?: Yes Treatment times per week: at least 3 Estimated Length of Stay: 5-7 days TR Treatment/Interventions: Group participation (Comment)  Discharge Criteria Pt will be discharged from therapy if:: Discharged Treatment plan/goals/alternatives discussed and agreed upon by:: Patient/family  Discharge Summary Short term goals set: Patient will focus on task/topic with 2 prompts from staff within 5 recreation therapy group sessions Short term goals met: Complete Progress toward goals comments: Groups attended Which groups?: Wellness,AAA/T,Stress management,Communication,Social skills,Coping skills,Leisure education,Goal setting,Other (Comment) (Self-care) Reason goals not met: N/A Therapeutic equipment acquired: N/A Reason patient discharged from therapy: Discharge from hospital Pt/family agrees with progress & goals achieved: Yes Date patient discharged from therapy: 06/15/20   Hawraa Stambaugh 06/15/2020, 10:51 AM

## 2020-07-15 ENCOUNTER — Other Ambulatory Visit: Payer: Self-pay | Admitting: Internal Medicine

## 2020-07-16 LAB — CBC
HCT: 42 % (ref 38.5–50.0)
Hemoglobin: 13.8 g/dL (ref 13.2–17.1)
MCH: 28.6 pg (ref 27.0–33.0)
MCHC: 32.9 g/dL (ref 32.0–36.0)
MCV: 87 fL (ref 80.0–100.0)
MPV: 10.9 fL (ref 7.5–12.5)
Platelets: 138 10*3/uL — ABNORMAL LOW (ref 140–400)
RBC: 4.83 10*6/uL (ref 4.20–5.80)
RDW: 14.4 % (ref 11.0–15.0)
WBC: 4.2 10*3/uL (ref 3.8–10.8)

## 2020-07-16 LAB — COMPLETE METABOLIC PANEL WITH GFR
AG Ratio: 1.3 (calc) (ref 1.0–2.5)
ALT: 14 U/L (ref 9–46)
AST: 21 U/L (ref 10–35)
Albumin: 4.4 g/dL (ref 3.6–5.1)
Alkaline phosphatase (APISO): 73 U/L (ref 35–144)
BUN: 12 mg/dL (ref 7–25)
CO2: 26 mmol/L (ref 20–32)
Calcium: 9.8 mg/dL (ref 8.6–10.3)
Chloride: 106 mmol/L (ref 98–110)
Creat: 1.06 mg/dL (ref 0.70–1.33)
GFR, Est African American: 89 mL/min/{1.73_m2} (ref >=60)
GFR, Est Non African American: 77 mL/min/{1.73_m2} (ref >=60)
Globulin: 3.5 g/dL (calc) (ref 1.9–3.7)
Glucose, Bld: 64 mg/dL — ABNORMAL LOW (ref 65–99)
Potassium: 4.7 mmol/L (ref 3.5–5.3)
Sodium: 142 mmol/L (ref 135–146)
Total Bilirubin: 0.3 mg/dL (ref 0.2–1.2)
Total Protein: 7.9 g/dL (ref 6.1–8.1)

## 2020-07-16 LAB — LIPID PANEL
Cholesterol: 146 mg/dL (ref <200)
HDL: 53 mg/dL (ref >=40)
LDL Cholesterol (Calc): 76 mg/dL (calc)
Non-HDL Cholesterol (Calc): 93 mg/dL (calc) (ref <130)
Total CHOL/HDL Ratio: 2.8 (calc) (ref <5.0)
Triglycerides: 83 mg/dL (ref <150)

## 2020-07-16 LAB — VITAMIN D 25 HYDROXY (VIT D DEFICIENCY, FRACTURES): Vit D, 25-Hydroxy: 41 ng/mL (ref 30–100)

## 2020-07-16 LAB — PSA: PSA: 0.26 ng/mL (ref <=4.00)

## 2020-07-16 LAB — TSH: TSH: 0.62 mIU/L (ref 0.40–4.50)
